# Patient Record
Sex: Female | Born: 1940 | Race: White | Hispanic: No | Smoking: Former smoker
Health system: Southern US, Community
[De-identification: ages and names within clinical notes are randomized; demographics above are authoritative.]

## PROBLEM LIST (undated history)

## (undated) DIAGNOSIS — I10 Essential (primary) hypertension: Secondary | ICD-10-CM

## (undated) DIAGNOSIS — B9681 Helicobacter pylori [H. pylori] as the cause of diseases classified elsewhere: Secondary | ICD-10-CM

## (undated) DIAGNOSIS — I5042 Chronic combined systolic (congestive) and diastolic (congestive) heart failure: Secondary | ICD-10-CM

## (undated) DIAGNOSIS — R7303 Prediabetes: Secondary | ICD-10-CM

## (undated) DIAGNOSIS — M1990 Unspecified osteoarthritis, unspecified site: Secondary | ICD-10-CM

## (undated) DIAGNOSIS — H3530 Unspecified macular degeneration: Secondary | ICD-10-CM

## (undated) DIAGNOSIS — I219 Acute myocardial infarction, unspecified: Secondary | ICD-10-CM

## (undated) DIAGNOSIS — E785 Hyperlipidemia, unspecified: Secondary | ICD-10-CM

## (undated) DIAGNOSIS — N183 Chronic kidney disease, stage 3 unspecified: Secondary | ICD-10-CM

## (undated) DIAGNOSIS — K2970 Gastritis, unspecified, without bleeding: Secondary | ICD-10-CM

## (undated) DIAGNOSIS — I214 Non-ST elevation (NSTEMI) myocardial infarction: Secondary | ICD-10-CM

## (undated) DIAGNOSIS — Z8673 Personal history of transient ischemic attack (TIA), and cerebral infarction without residual deficits: Secondary | ICD-10-CM

## (undated) DIAGNOSIS — J189 Pneumonia, unspecified organism: Secondary | ICD-10-CM

## (undated) DIAGNOSIS — I2510 Atherosclerotic heart disease of native coronary artery without angina pectoris: Secondary | ICD-10-CM

## (undated) DIAGNOSIS — J439 Emphysema, unspecified: Secondary | ICD-10-CM

## (undated) DIAGNOSIS — K219 Gastro-esophageal reflux disease without esophagitis: Secondary | ICD-10-CM

## (undated) DIAGNOSIS — I255 Ischemic cardiomyopathy: Secondary | ICD-10-CM

## (undated) DIAGNOSIS — C3490 Malignant neoplasm of unspecified part of unspecified bronchus or lung: Secondary | ICD-10-CM

## (undated) DIAGNOSIS — R55 Syncope and collapse: Secondary | ICD-10-CM

## (undated) DIAGNOSIS — M810 Age-related osteoporosis without current pathological fracture: Secondary | ICD-10-CM

## (undated) HISTORY — PX: CATARACT EXTRACTION: SUR2

## (undated) HISTORY — DX: Unspecified macular degeneration: H35.30

## (undated) HISTORY — DX: Ischemic cardiomyopathy: I25.5

## (undated) HISTORY — DX: Age-related osteoporosis without current pathological fracture: M81.0

## (undated) HISTORY — DX: Non-ST elevation (NSTEMI) myocardial infarction: I21.4

## (undated) HISTORY — DX: Acute myocardial infarction, unspecified: I21.9

## (undated) HISTORY — DX: Chronic kidney disease, stage 3 (moderate): N18.3

## (undated) HISTORY — DX: Emphysema, unspecified: J43.9

## (undated) HISTORY — DX: Essential (primary) hypertension: I10

## (undated) HISTORY — DX: Hyperlipidemia, unspecified: E78.5

## (undated) HISTORY — DX: Chronic kidney disease, stage 3 unspecified: N18.30

## (undated) HISTORY — DX: Chronic combined systolic (congestive) and diastolic (congestive) heart failure: I50.42

## (undated) HISTORY — DX: Malignant neoplasm of unspecified part of unspecified bronchus or lung: C34.90

## (undated) HISTORY — DX: Gastro-esophageal reflux disease without esophagitis: K21.9

## (undated) HISTORY — DX: Unspecified osteoarthritis, unspecified site: M19.90

## (undated) HISTORY — DX: Helicobacter pylori (H. pylori) as the cause of diseases classified elsewhere: B96.81

## (undated) HISTORY — PX: BREAST BIOPSY: SHX20

## (undated) HISTORY — DX: Gastritis, unspecified, without bleeding: K29.70

## (undated) HISTORY — DX: Atherosclerotic heart disease of native coronary artery without angina pectoris: I25.10

## (undated) HISTORY — PX: EYE SURGERY: SHX253

## (undated) HISTORY — DX: Personal history of transient ischemic attack (TIA), and cerebral infarction without residual deficits: Z86.73

## (undated) HISTORY — DX: Pneumonia, unspecified organism: J18.9

---

## 1984-07-27 HISTORY — PX: PARTIAL HYSTERECTOMY: SHX80

## 2006-03-24 ENCOUNTER — Encounter: Admission: RE | Admit: 2006-03-24 | Discharge: 2006-03-24 | Payer: Self-pay | Admitting: Internal Medicine

## 2006-04-20 ENCOUNTER — Encounter (INDEPENDENT_AMBULATORY_CARE_PROVIDER_SITE_OTHER): Payer: Self-pay | Admitting: Specialist

## 2006-04-20 ENCOUNTER — Encounter: Admission: RE | Admit: 2006-04-20 | Discharge: 2006-04-20 | Payer: Self-pay | Admitting: Internal Medicine

## 2006-06-02 ENCOUNTER — Ambulatory Visit (HOSPITAL_COMMUNITY): Admission: RE | Admit: 2006-06-02 | Discharge: 2006-06-02 | Payer: Self-pay | Admitting: General Surgery

## 2006-06-02 ENCOUNTER — Encounter (INDEPENDENT_AMBULATORY_CARE_PROVIDER_SITE_OTHER): Payer: Self-pay | Admitting: *Deleted

## 2006-06-22 ENCOUNTER — Encounter: Admission: RE | Admit: 2006-06-22 | Discharge: 2006-06-22 | Payer: Self-pay | Admitting: General Surgery

## 2006-07-27 HISTORY — PX: LUNG REMOVAL, PARTIAL: SHX233

## 2006-07-28 ENCOUNTER — Ambulatory Visit (HOSPITAL_COMMUNITY): Admission: RE | Admit: 2006-07-28 | Discharge: 2006-07-28 | Payer: Self-pay | Admitting: Thoracic Surgery

## 2006-08-06 ENCOUNTER — Ambulatory Visit (HOSPITAL_COMMUNITY): Admission: RE | Admit: 2006-08-06 | Discharge: 2006-08-06 | Payer: Self-pay | Admitting: Thoracic Surgery

## 2006-08-23 ENCOUNTER — Inpatient Hospital Stay (HOSPITAL_COMMUNITY): Admission: RE | Admit: 2006-08-23 | Discharge: 2006-08-28 | Payer: Self-pay | Admitting: Thoracic Surgery

## 2006-08-23 ENCOUNTER — Encounter (INDEPENDENT_AMBULATORY_CARE_PROVIDER_SITE_OTHER): Payer: Self-pay | Admitting: Specialist

## 2006-08-27 ENCOUNTER — Ambulatory Visit: Payer: Self-pay | Admitting: Internal Medicine

## 2006-08-31 ENCOUNTER — Encounter: Admission: RE | Admit: 2006-08-31 | Discharge: 2006-08-31 | Payer: Self-pay | Admitting: Thoracic Surgery

## 2006-08-31 ENCOUNTER — Ambulatory Visit: Payer: Self-pay | Admitting: Thoracic Surgery

## 2006-09-01 ENCOUNTER — Encounter: Admission: RE | Admit: 2006-09-01 | Discharge: 2006-09-01 | Payer: Self-pay | Admitting: Thoracic Surgery

## 2006-09-01 ENCOUNTER — Inpatient Hospital Stay (HOSPITAL_COMMUNITY): Admission: AD | Admit: 2006-09-01 | Discharge: 2006-09-08 | Payer: Self-pay | Admitting: Thoracic Surgery

## 2006-09-01 ENCOUNTER — Ambulatory Visit: Payer: Self-pay | Admitting: Thoracic Surgery

## 2006-09-11 ENCOUNTER — Ambulatory Visit: Payer: Self-pay | Admitting: Cardiothoracic Surgery

## 2006-09-11 ENCOUNTER — Emergency Department (HOSPITAL_COMMUNITY): Admission: EM | Admit: 2006-09-11 | Discharge: 2006-09-11 | Payer: Self-pay | Admitting: Emergency Medicine

## 2006-09-14 ENCOUNTER — Ambulatory Visit: Payer: Self-pay | Admitting: Thoracic Surgery

## 2006-09-14 ENCOUNTER — Encounter: Admission: RE | Admit: 2006-09-14 | Discharge: 2006-09-14 | Payer: Self-pay | Admitting: Thoracic Surgery

## 2006-09-15 ENCOUNTER — Ambulatory Visit: Payer: Self-pay | Admitting: Thoracic Surgery

## 2006-09-15 ENCOUNTER — Encounter: Admission: RE | Admit: 2006-09-15 | Discharge: 2006-09-15 | Payer: Self-pay | Admitting: Thoracic Surgery

## 2006-09-22 ENCOUNTER — Encounter: Admission: RE | Admit: 2006-09-22 | Discharge: 2006-09-22 | Payer: Self-pay | Admitting: Thoracic Surgery

## 2006-09-22 ENCOUNTER — Ambulatory Visit: Payer: Self-pay | Admitting: Thoracic Surgery

## 2006-09-29 ENCOUNTER — Ambulatory Visit: Payer: Self-pay | Admitting: Thoracic Surgery

## 2006-10-05 LAB — CBC WITH DIFFERENTIAL/PLATELET
BASO%: 0.7 % (ref 0.0–2.0)
LYMPH%: 29.7 % (ref 14.0–48.0)
MCHC: 35.1 g/dL (ref 32.0–36.0)
MONO#: 0.6 10*3/uL (ref 0.1–0.9)
RBC: 4.24 10*6/uL (ref 3.70–5.32)
WBC: 8 10*3/uL (ref 3.9–10.0)
lymph#: 2.4 10*3/uL (ref 0.9–3.3)

## 2006-10-05 LAB — COMPREHENSIVE METABOLIC PANEL
ALT: 11 U/L (ref 0–35)
Alkaline Phosphatase: 123 U/L — ABNORMAL HIGH (ref 39–117)
CO2: 26 mEq/L (ref 19–32)
Creatinine, Ser: 0.61 mg/dL (ref 0.40–1.20)
Total Bilirubin: 0.5 mg/dL (ref 0.3–1.2)

## 2006-10-27 ENCOUNTER — Encounter: Admission: RE | Admit: 2006-10-27 | Discharge: 2006-10-27 | Payer: Self-pay | Admitting: Thoracic Surgery

## 2006-10-27 ENCOUNTER — Ambulatory Visit: Payer: Self-pay | Admitting: Thoracic Surgery

## 2006-12-29 ENCOUNTER — Encounter: Admission: RE | Admit: 2006-12-29 | Discharge: 2006-12-29 | Payer: Self-pay | Admitting: Thoracic Surgery

## 2006-12-29 ENCOUNTER — Ambulatory Visit: Payer: Self-pay | Admitting: Thoracic Surgery

## 2007-03-29 ENCOUNTER — Encounter: Admission: RE | Admit: 2007-03-29 | Discharge: 2007-03-29 | Payer: Self-pay | Admitting: Internal Medicine

## 2007-04-01 ENCOUNTER — Ambulatory Visit: Payer: Self-pay | Admitting: Internal Medicine

## 2007-04-05 LAB — COMPREHENSIVE METABOLIC PANEL
CO2: 25 mEq/L (ref 19–32)
Creatinine, Ser: 0.73 mg/dL (ref 0.40–1.20)
Glucose, Bld: 112 mg/dL — ABNORMAL HIGH (ref 70–99)
Total Bilirubin: 0.4 mg/dL (ref 0.3–1.2)

## 2007-04-05 LAB — CBC WITH DIFFERENTIAL/PLATELET
BASO%: 0.4 % (ref 0.0–2.0)
Eosinophils Absolute: 0.5 10*3/uL (ref 0.0–0.5)
HCT: 38.1 % (ref 34.8–46.6)
LYMPH%: 27.5 % (ref 14.0–48.0)
MCHC: 35.2 g/dL (ref 32.0–36.0)
MCV: 91.9 fL (ref 81.0–101.0)
MONO#: 0.7 10*3/uL (ref 0.1–0.9)
NEUT%: 58.7 % (ref 39.6–76.8)
Platelets: 308 10*3/uL (ref 145–400)
WBC: 9.1 10*3/uL (ref 3.9–10.0)

## 2007-04-07 ENCOUNTER — Ambulatory Visit: Payer: Self-pay | Admitting: Thoracic Surgery

## 2007-04-07 ENCOUNTER — Encounter: Admission: RE | Admit: 2007-04-07 | Discharge: 2007-04-07 | Payer: Self-pay | Admitting: Thoracic Surgery

## 2007-07-06 ENCOUNTER — Encounter: Admission: RE | Admit: 2007-07-06 | Discharge: 2007-07-06 | Payer: Self-pay | Admitting: Internal Medicine

## 2007-08-02 ENCOUNTER — Ambulatory Visit: Payer: Self-pay | Admitting: Thoracic Surgery

## 2007-08-02 ENCOUNTER — Encounter: Admission: RE | Admit: 2007-08-02 | Discharge: 2007-08-02 | Payer: Self-pay | Admitting: Thoracic Surgery

## 2007-09-30 ENCOUNTER — Ambulatory Visit: Payer: Self-pay | Admitting: Internal Medicine

## 2007-10-04 ENCOUNTER — Ambulatory Visit (HOSPITAL_COMMUNITY): Admission: RE | Admit: 2007-10-04 | Discharge: 2007-10-04 | Payer: Self-pay | Admitting: Internal Medicine

## 2007-10-04 LAB — COMPREHENSIVE METABOLIC PANEL
ALT: 19 U/L (ref 0–35)
Albumin: 3.4 g/dL — ABNORMAL LOW (ref 3.5–5.2)
CO2: 27 mEq/L (ref 19–32)
Calcium: 8 mg/dL — ABNORMAL LOW (ref 8.4–10.5)
Chloride: 107 mEq/L (ref 96–112)
Sodium: 135 mEq/L (ref 135–145)
Total Protein: 6.3 g/dL (ref 6.0–8.3)

## 2007-10-04 LAB — CBC WITH DIFFERENTIAL/PLATELET
BASO%: 0.7 % (ref 0.0–2.0)
HCT: 38.3 % (ref 34.8–46.6)
MCHC: 34.2 g/dL (ref 32.0–36.0)
MONO#: 0.6 10*3/uL (ref 0.1–0.9)
NEUT%: 42.3 % (ref 39.6–76.8)
RBC: 4.15 10*6/uL (ref 3.70–5.32)
WBC: 7 10*3/uL (ref 3.9–10.0)
lymph#: 2.8 10*3/uL (ref 0.9–3.3)

## 2007-10-26 DIAGNOSIS — B9681 Helicobacter pylori [H. pylori] as the cause of diseases classified elsewhere: Secondary | ICD-10-CM

## 2007-10-26 HISTORY — DX: Helicobacter pylori (H. pylori) as the cause of diseases classified elsewhere: B96.81

## 2007-11-02 ENCOUNTER — Ambulatory Visit: Payer: Self-pay | Admitting: Thoracic Surgery

## 2008-04-03 ENCOUNTER — Encounter: Admission: RE | Admit: 2008-04-03 | Discharge: 2008-04-03 | Payer: Self-pay | Admitting: Internal Medicine

## 2008-04-09 ENCOUNTER — Ambulatory Visit: Payer: Self-pay | Admitting: Internal Medicine

## 2008-04-11 ENCOUNTER — Ambulatory Visit (HOSPITAL_COMMUNITY): Admission: RE | Admit: 2008-04-11 | Discharge: 2008-04-11 | Payer: Self-pay | Admitting: Internal Medicine

## 2008-04-11 LAB — COMPREHENSIVE METABOLIC PANEL
ALT: 21 U/L (ref 0–35)
CO2: 29 mEq/L (ref 19–32)
Calcium: 8.9 mg/dL (ref 8.4–10.5)
Chloride: 103 mEq/L (ref 96–112)
Creatinine, Ser: 0.83 mg/dL (ref 0.40–1.20)

## 2008-04-11 LAB — CBC WITH DIFFERENTIAL/PLATELET
BASO%: 0.7 % (ref 0.0–2.0)
Basophils Absolute: 0 10*3/uL (ref 0.0–0.1)
Eosinophils Absolute: 0.4 10*3/uL (ref 0.0–0.5)
HCT: 40.7 % (ref 34.8–46.6)
HGB: 13.9 g/dL (ref 11.6–15.9)
MCHC: 34 g/dL (ref 32.0–36.0)
MONO#: 0.4 10*3/uL (ref 0.1–0.9)
NEUT#: 3.2 10*3/uL (ref 1.5–6.5)
NEUT%: 53.1 % (ref 39.6–76.8)
WBC: 6.1 10*3/uL (ref 3.9–10.0)
lymph#: 2 10*3/uL (ref 0.9–3.3)

## 2008-10-09 ENCOUNTER — Ambulatory Visit: Payer: Self-pay | Admitting: Internal Medicine

## 2008-10-11 ENCOUNTER — Ambulatory Visit (HOSPITAL_COMMUNITY): Admission: RE | Admit: 2008-10-11 | Discharge: 2008-10-11 | Payer: Self-pay | Admitting: Internal Medicine

## 2008-10-11 LAB — CBC WITH DIFFERENTIAL/PLATELET
BASO%: 1 % (ref 0.0–2.0)
LYMPH%: 38.1 % (ref 14.0–49.7)
MCHC: 33.8 g/dL (ref 31.5–36.0)
MONO#: 0.5 10*3/uL (ref 0.1–0.9)
RBC: 4.47 10*6/uL (ref 3.70–5.45)
RDW: 13.1 % (ref 11.2–14.5)
WBC: 5.8 10*3/uL (ref 3.9–10.3)
lymph#: 2.2 10*3/uL (ref 0.9–3.3)

## 2008-10-11 LAB — COMPREHENSIVE METABOLIC PANEL
ALT: 19 U/L (ref 0–35)
CO2: 28 mEq/L (ref 19–32)
Chloride: 101 mEq/L (ref 96–112)
Potassium: 4.3 mEq/L (ref 3.5–5.3)
Sodium: 136 mEq/L (ref 135–145)
Total Bilirubin: 0.8 mg/dL (ref 0.3–1.2)
Total Protein: 6.8 g/dL (ref 6.0–8.3)

## 2009-02-14 ENCOUNTER — Encounter: Admission: RE | Admit: 2009-02-14 | Discharge: 2009-02-14 | Payer: Self-pay | Admitting: Internal Medicine

## 2009-03-27 DIAGNOSIS — I219 Acute myocardial infarction, unspecified: Secondary | ICD-10-CM

## 2009-03-27 DIAGNOSIS — I2510 Atherosclerotic heart disease of native coronary artery without angina pectoris: Secondary | ICD-10-CM

## 2009-03-27 HISTORY — DX: Atherosclerotic heart disease of native coronary artery without angina pectoris: I25.10

## 2009-03-27 HISTORY — DX: Acute myocardial infarction, unspecified: I21.9

## 2009-04-07 ENCOUNTER — Inpatient Hospital Stay (HOSPITAL_COMMUNITY): Admission: EM | Admit: 2009-04-07 | Discharge: 2009-04-12 | Payer: Self-pay | Admitting: Emergency Medicine

## 2009-04-07 ENCOUNTER — Ambulatory Visit: Payer: Self-pay | Admitting: Cardiovascular Disease

## 2009-04-08 ENCOUNTER — Encounter: Payer: Self-pay | Admitting: Cardiovascular Disease

## 2009-04-09 ENCOUNTER — Ambulatory Visit: Payer: Self-pay | Admitting: Internal Medicine

## 2009-04-15 ENCOUNTER — Telehealth: Payer: Self-pay | Admitting: Cardiovascular Disease

## 2009-04-23 DIAGNOSIS — H3530 Unspecified macular degeneration: Secondary | ICD-10-CM

## 2009-04-23 DIAGNOSIS — M1990 Unspecified osteoarthritis, unspecified site: Secondary | ICD-10-CM

## 2009-04-23 DIAGNOSIS — I10 Essential (primary) hypertension: Secondary | ICD-10-CM

## 2009-04-24 ENCOUNTER — Encounter: Payer: Self-pay | Admitting: Physician Assistant

## 2009-04-24 ENCOUNTER — Encounter (INDEPENDENT_AMBULATORY_CARE_PROVIDER_SITE_OTHER): Payer: Self-pay | Admitting: *Deleted

## 2009-04-24 ENCOUNTER — Ambulatory Visit: Payer: Self-pay | Admitting: Cardiology

## 2009-04-24 DIAGNOSIS — I2510 Atherosclerotic heart disease of native coronary artery without angina pectoris: Secondary | ICD-10-CM

## 2009-04-24 DIAGNOSIS — K219 Gastro-esophageal reflux disease without esophagitis: Secondary | ICD-10-CM | POA: Insufficient documentation

## 2009-04-24 DIAGNOSIS — E785 Hyperlipidemia, unspecified: Secondary | ICD-10-CM | POA: Insufficient documentation

## 2009-04-30 ENCOUNTER — Ambulatory Visit (HOSPITAL_COMMUNITY): Admission: RE | Admit: 2009-04-30 | Discharge: 2009-04-30 | Payer: Self-pay | Admitting: Internal Medicine

## 2009-04-30 LAB — COMPREHENSIVE METABOLIC PANEL
ALT: 17 U/L (ref 0–35)
CO2: 24 mEq/L (ref 19–32)
Calcium: 8.9 mg/dL (ref 8.4–10.5)
Chloride: 105 mEq/L (ref 96–112)
Creatinine, Ser: 1.01 mg/dL (ref 0.40–1.20)
Glucose, Bld: 110 mg/dL — ABNORMAL HIGH (ref 70–99)

## 2009-04-30 LAB — CBC WITH DIFFERENTIAL/PLATELET
BASO%: 1 % (ref 0.0–2.0)
Basophils Absolute: 0.1 10*3/uL (ref 0.0–0.1)
Eosinophils Absolute: 0.4 10*3/uL (ref 0.0–0.5)
HCT: 36.2 % (ref 34.8–46.6)
HGB: 12.3 g/dL (ref 11.6–15.9)
LYMPH%: 37.4 % (ref 14.0–49.7)
MCHC: 33.8 g/dL (ref 31.5–36.0)
MONO#: 0.5 10*3/uL (ref 0.1–0.9)
NEUT#: 2.6 10*3/uL (ref 1.5–6.5)
NEUT%: 46.1 % (ref 38.4–76.8)
Platelets: 275 10*3/uL (ref 145–400)
WBC: 5.7 10*3/uL (ref 3.9–10.3)
lymph#: 2.1 10*3/uL (ref 0.9–3.3)

## 2009-05-02 ENCOUNTER — Encounter: Payer: Self-pay | Admitting: Cardiovascular Disease

## 2009-05-05 ENCOUNTER — Ambulatory Visit: Payer: Self-pay | Admitting: Cardiovascular Disease

## 2009-05-05 ENCOUNTER — Inpatient Hospital Stay (HOSPITAL_COMMUNITY): Admission: EM | Admit: 2009-05-05 | Discharge: 2009-05-11 | Payer: Self-pay | Admitting: Emergency Medicine

## 2009-05-06 ENCOUNTER — Encounter: Payer: Self-pay | Admitting: Cardiovascular Disease

## 2009-05-09 ENCOUNTER — Encounter: Payer: Self-pay | Admitting: Cardiology

## 2009-05-13 ENCOUNTER — Telehealth: Payer: Self-pay | Admitting: Cardiovascular Disease

## 2009-05-13 ENCOUNTER — Encounter: Payer: Self-pay | Admitting: Cardiovascular Disease

## 2009-05-16 ENCOUNTER — Telehealth: Payer: Self-pay | Admitting: Cardiovascular Disease

## 2009-05-20 ENCOUNTER — Ambulatory Visit: Payer: Self-pay | Admitting: Cardiovascular Disease

## 2009-05-21 ENCOUNTER — Ambulatory Visit: Payer: Self-pay | Admitting: Internal Medicine

## 2009-05-22 LAB — CONVERTED CEMR LAB
AST: 22 units/L (ref 0–37)
Albumin: 3.7 g/dL (ref 3.5–5.2)
Alkaline Phosphatase: 98 units/L (ref 39–117)
BUN: 16 mg/dL (ref 6–23)
CO2: 31 meq/L (ref 19–32)
Calcium: 9 mg/dL (ref 8.4–10.5)
Cholesterol: 142 mg/dL (ref 0–200)
Creatinine, Ser: 1 mg/dL (ref 0.4–1.2)
Total Protein: 7 g/dL (ref 6.0–8.3)
Triglycerides: 77 mg/dL (ref 0.0–149.0)

## 2009-05-23 ENCOUNTER — Ambulatory Visit (HOSPITAL_COMMUNITY): Admission: RE | Admit: 2009-05-23 | Discharge: 2009-05-23 | Payer: Self-pay | Admitting: Internal Medicine

## 2009-05-23 LAB — COMPREHENSIVE METABOLIC PANEL
ALT: 23 U/L (ref 0–35)
Albumin: 3.8 g/dL (ref 3.5–5.2)
CO2: 30 mEq/L (ref 19–32)
Calcium: 9.2 mg/dL (ref 8.4–10.5)
Chloride: 101 mEq/L (ref 96–112)
Glucose, Bld: 103 mg/dL — ABNORMAL HIGH (ref 70–99)
Sodium: 137 mEq/L (ref 135–145)
Total Protein: 7 g/dL (ref 6.0–8.3)

## 2009-05-23 LAB — CBC WITH DIFFERENTIAL/PLATELET
BASO%: 0.9 % (ref 0.0–2.0)
Eosinophils Absolute: 0.6 10*3/uL — ABNORMAL HIGH (ref 0.0–0.5)
HCT: 39.6 % (ref 34.8–46.6)
MCHC: 34 g/dL (ref 31.5–36.0)
MONO#: 0.5 10*3/uL (ref 0.1–0.9)
NEUT#: 2.9 10*3/uL (ref 1.5–6.5)
RBC: 4.19 10*6/uL (ref 3.70–5.45)
WBC: 6 10*3/uL (ref 3.9–10.3)
lymph#: 1.9 10*3/uL (ref 0.9–3.3)

## 2009-05-28 ENCOUNTER — Encounter: Payer: Self-pay | Admitting: Cardiovascular Disease

## 2009-05-30 ENCOUNTER — Encounter: Payer: PRIVATE HEALTH INSURANCE | Admitting: Cardiovascular Disease

## 2009-05-31 ENCOUNTER — Encounter (INDEPENDENT_AMBULATORY_CARE_PROVIDER_SITE_OTHER): Payer: Self-pay | Admitting: *Deleted

## 2009-06-03 ENCOUNTER — Ambulatory Visit: Payer: Self-pay | Admitting: Cardiovascular Disease

## 2009-06-04 ENCOUNTER — Encounter: Payer: Self-pay | Admitting: Cardiovascular Disease

## 2009-06-04 ENCOUNTER — Ambulatory Visit: Payer: Self-pay | Admitting: Thoracic Surgery

## 2009-06-11 ENCOUNTER — Ambulatory Visit: Payer: Self-pay | Admitting: Thoracic Surgery

## 2009-06-11 ENCOUNTER — Encounter: Payer: Self-pay | Admitting: Thoracic Surgery

## 2009-06-11 ENCOUNTER — Ambulatory Visit (HOSPITAL_COMMUNITY): Admission: RE | Admit: 2009-06-11 | Discharge: 2009-06-11 | Payer: Self-pay | Admitting: Thoracic Surgery

## 2009-06-12 ENCOUNTER — Ambulatory Visit: Payer: Self-pay | Admitting: Thoracic Surgery

## 2009-06-17 DIAGNOSIS — I5043 Acute on chronic combined systolic (congestive) and diastolic (congestive) heart failure: Secondary | ICD-10-CM | POA: Insufficient documentation

## 2009-06-17 DIAGNOSIS — I5042 Chronic combined systolic (congestive) and diastolic (congestive) heart failure: Secondary | ICD-10-CM

## 2009-06-17 HISTORY — DX: Chronic combined systolic (congestive) and diastolic (congestive) heart failure: I50.42

## 2009-06-19 ENCOUNTER — Encounter: Payer: Self-pay | Admitting: Cardiovascular Disease

## 2009-06-26 ENCOUNTER — Encounter: Payer: PRIVATE HEALTH INSURANCE | Admitting: Cardiovascular Disease

## 2009-06-27 ENCOUNTER — Encounter: Payer: Self-pay | Admitting: Cardiovascular Disease

## 2009-07-08 ENCOUNTER — Ambulatory Visit: Payer: Self-pay | Admitting: Internal Medicine

## 2009-07-09 ENCOUNTER — Ambulatory Visit: Payer: Self-pay | Admitting: Thoracic Surgery

## 2009-07-10 ENCOUNTER — Encounter: Payer: Self-pay | Admitting: Cardiovascular Disease

## 2009-07-10 LAB — CBC WITH DIFFERENTIAL/PLATELET
Eosinophils Absolute: 0.6 10*3/uL — ABNORMAL HIGH (ref 0.0–0.5)
MONO#: 0.6 10*3/uL (ref 0.1–0.9)
NEUT#: 2.5 10*3/uL (ref 1.5–6.5)
Platelets: 227 10*3/uL (ref 145–400)
RBC: 3.95 10*6/uL (ref 3.70–5.45)
RDW: 14.2 % (ref 11.2–14.5)
WBC: 6.1 10*3/uL (ref 3.9–10.3)

## 2009-07-10 LAB — COMPREHENSIVE METABOLIC PANEL
Albumin: 4 g/dL (ref 3.5–5.2)
CO2: 27 mEq/L (ref 19–32)
Glucose, Bld: 103 mg/dL — ABNORMAL HIGH (ref 70–99)
Potassium: 4 mEq/L (ref 3.5–5.3)
Sodium: 140 mEq/L (ref 135–145)
Total Protein: 6.7 g/dL (ref 6.0–8.3)

## 2009-07-15 ENCOUNTER — Ambulatory Visit: Payer: Self-pay | Admitting: Cardiovascular Disease

## 2009-07-15 ENCOUNTER — Encounter: Payer: Self-pay | Admitting: Cardiovascular Disease

## 2009-07-27 ENCOUNTER — Encounter: Payer: PRIVATE HEALTH INSURANCE | Admitting: Cardiovascular Disease

## 2009-08-14 ENCOUNTER — Encounter: Payer: Self-pay | Admitting: Cardiovascular Disease

## 2009-09-02 ENCOUNTER — Ambulatory Visit: Payer: Self-pay | Admitting: Cardiovascular Disease

## 2009-09-26 ENCOUNTER — Encounter: Payer: Self-pay | Admitting: Cardiovascular Disease

## 2009-09-27 ENCOUNTER — Ambulatory Visit: Payer: Self-pay | Admitting: Internal Medicine

## 2009-10-01 ENCOUNTER — Ambulatory Visit (HOSPITAL_COMMUNITY): Admission: RE | Admit: 2009-10-01 | Discharge: 2009-10-01 | Payer: Self-pay | Admitting: Internal Medicine

## 2009-10-01 LAB — COMPREHENSIVE METABOLIC PANEL
Alkaline Phosphatase: 120 U/L — ABNORMAL HIGH (ref 39–117)
CO2: 30 mEq/L (ref 19–32)
Creatinine, Ser: 1.03 mg/dL (ref 0.40–1.20)
Glucose, Bld: 104 mg/dL — ABNORMAL HIGH (ref 70–99)
Sodium: 137 mEq/L (ref 135–145)
Total Bilirubin: 0.7 mg/dL (ref 0.3–1.2)
Total Protein: 7.2 g/dL (ref 6.0–8.3)

## 2009-10-01 LAB — CBC WITH DIFFERENTIAL/PLATELET
EOS%: 7.3 % — ABNORMAL HIGH (ref 0.0–7.0)
Eosinophils Absolute: 0.4 10*3/uL (ref 0.0–0.5)
LYMPH%: 42.7 % (ref 14.0–49.7)
MCH: 31.8 pg (ref 25.1–34.0)
MCV: 92.8 fL (ref 79.5–101.0)
MONO%: 7.7 % (ref 0.0–14.0)
Platelets: 236 10*3/uL (ref 145–400)
RBC: 4.18 10*6/uL (ref 3.70–5.45)
RDW: 13.3 % (ref 11.2–14.5)
nRBC: 0 % (ref 0–0)

## 2009-10-10 ENCOUNTER — Encounter: Payer: Self-pay | Admitting: Cardiovascular Disease

## 2009-10-17 ENCOUNTER — Ambulatory Visit: Payer: Self-pay | Admitting: Cardiovascular Disease

## 2009-10-17 ENCOUNTER — Encounter: Payer: Self-pay | Admitting: Cardiovascular Disease

## 2009-10-20 ENCOUNTER — Encounter (INDEPENDENT_AMBULATORY_CARE_PROVIDER_SITE_OTHER): Payer: Self-pay | Admitting: *Deleted

## 2009-11-14 ENCOUNTER — Encounter (INDEPENDENT_AMBULATORY_CARE_PROVIDER_SITE_OTHER): Payer: Self-pay | Admitting: *Deleted

## 2010-01-02 ENCOUNTER — Ambulatory Visit: Payer: Self-pay | Admitting: Cardiovascular Disease

## 2010-01-03 ENCOUNTER — Encounter: Payer: Self-pay | Admitting: Cardiovascular Disease

## 2010-01-03 LAB — CONVERTED CEMR LAB
AST: 34 units/L (ref 0–37)
BUN: 21 mg/dL (ref 6–23)
Calcium: 9.2 mg/dL (ref 8.4–10.5)
Cholesterol: 165 mg/dL (ref 0–200)
GFR calc non Af Amer: 56.46 mL/min (ref >60)
HDL: 72.9 mg/dL (ref >39.00)
LDL Cholesterol: 79 mg/dL (ref 0–99)
Potassium: 4.5 meq/L (ref 3.5–5.1)
Sodium: 138 meq/L (ref 135–145)
Total Bilirubin: 0.7 mg/dL (ref 0.3–1.2)
VLDL: 13.4 mg/dL (ref 0.0–40.0)

## 2010-04-02 ENCOUNTER — Ambulatory Visit: Payer: Self-pay | Admitting: Internal Medicine

## 2010-04-04 ENCOUNTER — Ambulatory Visit: Payer: Self-pay | Admitting: Cardiovascular Disease

## 2010-04-07 ENCOUNTER — Ambulatory Visit (HOSPITAL_COMMUNITY): Admission: RE | Admit: 2010-04-07 | Discharge: 2010-04-07 | Payer: Self-pay | Admitting: Internal Medicine

## 2010-04-07 ENCOUNTER — Encounter: Payer: Self-pay | Admitting: Cardiovascular Disease

## 2010-04-07 LAB — COMPREHENSIVE METABOLIC PANEL
ALT: 27 U/L (ref 0–35)
AST: 30 U/L (ref 0–37)
Albumin: 4 g/dL (ref 3.5–5.2)
Alkaline Phosphatase: 100 U/L (ref 39–117)
Potassium: 4.4 mEq/L (ref 3.5–5.3)
Sodium: 140 mEq/L (ref 135–145)
Total Bilirubin: 0.8 mg/dL (ref 0.3–1.2)
Total Protein: 7.1 g/dL (ref 6.0–8.3)

## 2010-04-07 LAB — CBC WITH DIFFERENTIAL/PLATELET
BASO%: 0.8 % (ref 0.0–2.0)
EOS%: 8.3 % — ABNORMAL HIGH (ref 0.0–7.0)
Eosinophils Absolute: 0.5 10*3/uL (ref 0.0–0.5)
LYMPH%: 33.5 % (ref 14.0–49.7)
MCH: 31.9 pg (ref 25.1–34.0)
MCHC: 33 g/dL (ref 31.5–36.0)
MCV: 96.5 fL (ref 79.5–101.0)
MONO%: 9 % (ref 0.0–14.0)
NEUT#: 3.2 10*3/uL (ref 1.5–6.5)
RBC: 4.15 10*6/uL (ref 3.70–5.45)
RDW: 13.5 % (ref 11.2–14.5)

## 2010-04-10 ENCOUNTER — Encounter: Payer: Self-pay | Admitting: Cardiovascular Disease

## 2010-05-12 ENCOUNTER — Ambulatory Visit: Payer: Self-pay | Admitting: Cardiovascular Disease

## 2010-07-03 ENCOUNTER — Encounter: Payer: Self-pay | Admitting: Cardiovascular Disease

## 2010-08-16 ENCOUNTER — Other Ambulatory Visit: Payer: Self-pay | Admitting: Internal Medicine

## 2010-08-16 DIAGNOSIS — C3490 Malignant neoplasm of unspecified part of unspecified bronchus or lung: Secondary | ICD-10-CM

## 2010-08-17 ENCOUNTER — Encounter: Payer: Self-pay | Admitting: Internal Medicine

## 2010-08-17 ENCOUNTER — Encounter: Payer: Self-pay | Admitting: Thoracic Surgery

## 2010-08-18 ENCOUNTER — Encounter: Payer: Self-pay | Admitting: Thoracic Surgery

## 2010-08-18 ENCOUNTER — Encounter: Payer: Self-pay | Admitting: Internal Medicine

## 2010-08-26 NOTE — Assessment & Plan Note (Signed)
Summary: f6m   Visit Type:  3 months follow up  Primary Provider:  Dr Cindee Lame  CC:  Chest soreness.  History of Present Illness: This is a 70 year old female patient, who had an ST elevation MI treated with a bare-metal stent to the circumflex April 07, 2009. She initially did well, but was hospitalized for CHF and underwent repeat right and left heart cathshowing LCx stent was widely patent and she has continued with medical management.  She has a hx of lung CA and was noted to have mediastinal adenopathy noted in November 2010. She underwent biopsy demonstrating no disease recurrence.   Overall she is doing well. She has been walking regularly for exercise, about 1.5 miles 4 days per week. She is limited by shortness of breath. She does admit to chest pain with prolonged walking but this eases up when she slows down. No change in the pattern of her angina. She has taken 2 NTG over the last several months. No edema, orthopnea, or PND.  She had an ACE-induced cough and was changed to Cozaar at the time of her last office visit 3 months ago - the cough has now resolved.      Current Medications (verified): 1)  Aspirin 325 Mg Tabs (Aspirin) .... Take 1 Tab By Mouth Every Day 2)  Plavix 75 Mg Tabs (Clopidogrel Bisulfate) .... Take One Daily 3)  Nitroglycerin 0.4 Mg/hr Pt24 (Nitroglycerin) .... Take One As Needed 4)  Simvastatin 40 Mg Tabs (Simvastatin) .... Take One Daily 5)  Lasix 40 Mg Tabs (Furosemide) .... Take 1 Tablet Am and 1/2 Tablet Pm 6)  Carvedilol 3.125 Mg Tabs (Carvedilol) .... Take One Tablet By Mouth Twice A Day 7)  Cozaar 50 Mg Tabs (Losartan Potassium) .... Take One Tablet By Mouth Daily 8)  Calcium Carbonate-Vitamin D 600-400 Mg-Unit  Tabs (Calcium Carbonate-Vitamin D) .... Take 2 Tablets Daily 9)  Placebo/darapladib 160mg  Tablet .... Take 1 Tablet By Mouth Once A Day 10)  Potassium Chloride Crys Cr 20 Meq Cr-Tabs (Potassium Chloride Crys Cr) .... Take One Tablet By  Mouth Three Times A Day  Allergies (verified): No Known Drug Allergies  Past History:  Past medical history reviewed for relevance to current acute and chronic problems.  Past Medical History: 1. Hypertension. 2. Degenerative arthritis. 3. Macular degeneration. 4. Acute myocardial infarction 2010 - treated with BMS of LCx. LVEF 50%, with subsequent CHF 5. Lung CA, s/p resection  Review of Systems       Negative except as per HPI   Vital Signs:  Patient profile:   70 year old female Height:      68 inches Weight:      145 pounds BMI:     22.13 Pulse rate:   58 / minute Pulse rhythm:   regular Resp:     18 per minute BP sitting:   122 / 70  (left arm) Cuff size:   large  Vitals Entered By: Vikki Ports (September 02, 2009 10:05 AM)  Physical Exam  General:  Pt is alert and oriented, in no acute distress. HEENT: normal Neck: normal carotid upstrokes without bruits, JVP normal Lungs: CTA CV: RRR without murmur or gallop Abd: soft, NT, positive BS, no bruit, no organomegaly Ext: no clubbing, cyanosis, or edema. peripheral pulses 2+ and equal Skin: warm and dry without rash    EKG  Procedure date:  09/02/2009  Findings:      NSR with nonspecific ST-T abnormality, unchanged from previous tracing, HR 58  bpm.  Impression & Recommendations:  Problem # 1:  CAD, NATIVE VESSEL (ICD-414.01)  Pt stable, Class II angina. She had 'relook cath' for CHF and angina, which demonstrated patent coronaries and patent stent. Continue medical therapy as below. Decrease ASA to 81 mg. Continue exercise program. Follow-up in 4 months.  Her updated medication list for this problem includes:    Aspirin 325 Mg Tabs (Aspirin) .Marland Kitchen... Take 1 tab by mouth every day    Plavix 75 Mg Tabs (Clopidogrel bisulfate) .Marland Kitchen... Take one daily    Nitroglycerin 0.4 Mg/hr Pt24 (Nitroglycerin) .Marland Kitchen... Take one as needed    Carvedilol 3.125 Mg Tabs (Carvedilol) .Marland Kitchen... Take one tablet by mouth twice a  day  Orders: EKG w/ Interpretation (93000)  Problem # 2:  HYPERLIPIDEMIA-MIXED (ICD-272.4) Lipids have been at goal (see below) - due for f/u lipids and lft's at next office visit in 4 months. Her updated medication list for this problem includes:    Simvastatin 40 Mg Tabs (Simvastatin) .Marland Kitchen... Take one daily  CHOL: 142 (05/20/2009)   LDL: 72 (05/20/2009)   HDL: 54.90 (05/20/2009)   TG: 77.0 (05/20/2009)  Problem # 3:  HYPERTENSION (ICD-401.9) BP well-controlled on current Rx.  Continue without changes. F/u BMET at next blood draw.  Her updated medication list for this problem includes:    Aspirin 81 Mg Tbec (Aspirin) .Marland Kitchen... Take one tablet by mouth daily    Lasix 40 Mg Tabs (Furosemide) .Marland Kitchen... Take 1 tablet am and 1/2 tablet pm    Carvedilol 3.125 Mg Tabs (Carvedilol) .Marland Kitchen... Take one tablet by mouth twice a day    Cozaar 50 Mg Tabs (Losartan potassium) .Marland Kitchen... Take one tablet by mouth daily  BP today: 122/70 Prior BP: 110/80 (06/03/2009)  Labs Reviewed: K+: 4.5 (05/20/2009) Creat: : 1.0 (05/20/2009)   Chol: 142 (05/20/2009)   HDL: 54.90 (05/20/2009)   LDL: 72 (05/20/2009)   TG: 77.0 (05/20/2009)  Patient Instructions: 1)  Your physician recommends that you return for a FASTING LIPID, LIVER and BMP in 4 MONTHS (414.01, 272.0, v58.69)  2)  Your physician has recommended you make the following change in your medication: DECREASE Aspirin to 81mg  once a day 3)  Your physician recommends that you schedule a follow-up appointment in: 4 MONTHS

## 2010-08-26 NOTE — Letter (Signed)
Summary: [facility]   [facility]   Imported By: Roderic Ovens 07/30/2009 13:27:44  _____________________________________________________________________  External Attachment:    Type:   Image     Comment:   External Document

## 2010-08-26 NOTE — Assessment & Plan Note (Signed)
Summary: yearly/sl   Visit Type:  1 year follow up Primary Provider:  Dr Ludwig Clarks  CC:  Sob sometimes.  History of Present Illness: This is a 70 year old woman who had an ST elevation MI treated with a bare-metal stent to the circumflex April 07, 2009. She initially did well, but was hospitalized for CHF and underwent repeat right and left heart cath showing LCx stent was widely patent and she has continued with medical management.  She presents today for follow-up evaluation.  She walks for 3 miles every other day, sometimes without stopping and other times she has to stop and rest. Denies chest pain or tightness with exertion, but has had a few brief episodes of resting chest pain. These resolved spontaneously without NTG. No edema, orthopnea, or PND.             Current Medications (verified): 1)  Aspirin 81 Mg Tbec (Aspirin) .... Take One Tablet By Mouth Daily 2)  Plavix 75 Mg Tabs (Clopidogrel Bisulfate) .... Take One Daily 3)  Nitroglycerin 0.4 Mg/hr Pt24 (Nitroglycerin) .... Take One As Needed 4)  Simvastatin 40 Mg Tabs (Simvastatin) .... Take One Daily 5)  Lasix 40 Mg Tabs (Furosemide) .... Take 1 Tablet Am and 1/2 Tablet Pm 6)  Carvedilol 3.125 Mg Tabs (Carvedilol) .... Take One Tablet By Mouth Twice A Day 7)  Cozaar 50 Mg Tabs (Losartan Potassium) .... Take One Tablet By Mouth Daily 8)  Calcium Carbonate-Vitamin D 600-400 Mg-Unit  Tabs (Calcium Carbonate-Vitamin D) .... Take 2 Tablets Daily 9)  Placebo/darapladib 160mg  Tablet .... Take 1 Tablet By Mouth Once A Day 10)  Potassium Chloride Crys Cr 20 Meq Cr-Tabs (Potassium Chloride Crys Cr) .... Take 1 Tablet By Mouth Once A Day 11)  Famotidine 40 Mg Tabs (Famotidine) .Marland Kitchen.. 1 Tab By Mouth Daily  Allergies (verified): No Known Drug Allergies  Past History:  Past medical history reviewed for relevance to current acute and chronic problems.  Past Medical History: Reviewed history from 01/02/2010 and no changes  required. 1. Hypertension. 2. Degenerative arthritis. 3. Macular degeneration. 4. Acute myocardial infarction 2010 - treated with BMS of LCx. LVEF 50%, with subsequent CHF 5. Lung CA, s/p resection, followed by Dr Shirline Frees  Review of Systems       Negative except as per HPI   Vital Signs:  Patient profile:   70 year old female Height:      68 inches Weight:      139.75 pounds BMI:     21.33 Pulse rate:   62 / minute Pulse rhythm:   regular Resp:     18 per minute BP sitting:   124 / 74  (left arm) Cuff size:   large  Vitals Entered By: Vikki Ports (May 12, 2010 3:17 PM)  Physical Exam  General:  Pt is alert and oriented, age-appropriate woman, in no acute distress. HEENT: normal Neck: normal carotid upstrokes without bruits, JVP normal Lungs: Decreased breath sounds throughout but no rales CV: RRR without murmur or gallop Abd: soft, NT, positive BS, no bruit, no organomegaly Ext: no clubbing, cyanosis, or edema. peripheral pulses 2+ and equal Skin: warm and dry without rash    EKG  Procedure date:  05/12/2010  Findings:      NSR, nonspecific ST-T wave abnormality, HR 62 bpm  Impression & Recommendations:  Problem # 1:  CAD, NATIVE VESSEL (ICD-414.01) Stable without angina. She is out now 12 months from her infarct and was treated with a bare metal stent.  I advised she can stop plavix after her current bottle runs out. She will otherwise continue wiht her current medical program.  Her updated medication list for this problem includes:    Aspirin 81 Mg Tbec (Aspirin) .Marland Kitchen... Take one tablet by mouth daily    Plavix 75 Mg Tabs (Clopidogrel bisulfate) .Marland Kitchen... Take one daily    Nitroglycerin 0.4 Mg/hr Pt24 (Nitroglycerin) .Marland Kitchen... Take one as needed    Carvedilol 3.125 Mg Tabs (Carvedilol) .Marland Kitchen... Take one tablet by mouth twice a day  Orders: EKG w/ Interpretation (93000)  Problem # 2:  CONGESTIVE HEART FAILURE, LEFT (ICD-428.1) Stable without evidence of volume  overload. LVEF preserved post-MI.  Problem # 3:  HYPERLIPIDEMIA-MIXED (ICD-272.4) Lipids at goal. Continue current medical program.  Her updated medication list for this problem includes:    Simvastatin 40 Mg Tabs (Simvastatin) .Marland Kitchen... Take one daily  CHOL: 165 (01/02/2010)   LDL: 79 (01/02/2010)   HDL: 72.90 (01/02/2010)   TG: 67.0 (01/02/2010)  Problem # 4:  HYPERTENSION (ICD-401.9) Controlled.  Her updated medication list for this problem includes:    Aspirin 81 Mg Tbec (Aspirin) .Marland Kitchen... Take one tablet by mouth daily    Lasix 40 Mg Tabs (Furosemide) .Marland Kitchen... Take 1 tablet am and 1/2 tablet pm    Carvedilol 3.125 Mg Tabs (Carvedilol) .Marland Kitchen... Take one tablet by mouth twice a day    Cozaar 50 Mg Tabs (Losartan potassium) .Marland Kitchen... Take one tablet by mouth daily  BP today: 124/74 Prior BP: 110/73 (01/02/2010)  Labs Reviewed: K+: 4.5 (01/02/2010) Creat: : 1.0 (01/02/2010)   Chol: 165 (01/02/2010)   HDL: 72.90 (01/02/2010)   LDL: 79 (01/02/2010)   TG: 67.0 (01/02/2010)  Patient Instructions: 1)  Your physician recommends that you schedule a follow-up appointment in: 6 months 2)  Your physician has recommended you make the following change in your medication: STOP plavix after you finish the bottle.  Prescriptions: FAMOTIDINE 40 MG TABS (FAMOTIDINE) 1 tab by mouth daily  #30 x 6   Entered by:   Whitney Maeola Sarah RN   Authorized by:   Norva Karvonen, MD   Signed by:   Ellender Hose RN on 05/12/2010   Method used:   Electronically to        [facility]* (retail)       [address]       [place], [place]  [address]       Ph: [phone]       Fax: [phone]   RxID:   [id number]

## 2010-08-26 NOTE — Letter (Signed)
[facility]   Imported By: Roderic Ovens 11/18/2009 16:28:49  _____________________________________________________________________  External Attachment:    Type:   Image     Comment:   External Document

## 2010-08-26 NOTE — Letter (Signed)
Summary: [facility]  [facility]   Imported By: Marylou Mccoy 05/09/2010 14:09:18  _____________________________________________________________________  External Attachment:    Type:   Image     Comment:   External Document

## 2010-08-26 NOTE — Letter (Signed)
Summary: Dr Ludwig Clarks note  Dr Ludwig Clarks note   Imported By: Kassie Mends 10/25/2009 09:41:17  _____________________________________________________________________  External Attachment:    Type:   Image     Comment:   External Document

## 2010-08-26 NOTE — Letter (Signed)
Summary: [facility]  [facility]   Imported By: Marylou Mccoy 04/07/2010 10:53:38  _____________________________________________________________________  External Attachment:    Type:   Image     Comment:   External Document  Appended Document: [facility] Pt had lab rechecked and potassium was 4.4.

## 2010-08-26 NOTE — Letter (Signed)
Summary: [facility]  [facility], Main Office  [address]   [place], [place] [address]   Phone: [phone]  Fax: [phone]     January 03, 2010 MRN: [id number]   [facility] [address] [place], [place]  [address]   Dear Ms. Putman,  We have reviewed your cholesterol results.  They are as follows:     Total Cholesterol:    165 (Desirable: less than 200)       HDL  Cholesterol:     72.90  (Desirable: greater than 40 for men and 50 for women)       LDL Cholesterol:       79  (Desirable: less than 100 for low risk and less than 70 for moderate to high risk)       Triglycerides:       67.0  (Desirable: less than 150)  Our recommendations include: Your lipids are at goal.  Liver function, potassium and kidney function are okay.   Call our office at the number listed above if you have any questions.  Lowering your LDL cholesterol is important, but it is only one of a large number of "risk factors" that may indicate that you are at risk for heart disease, stroke or other complications of hardening of the arteries.  Other risk factors include:   A.  Cigarette Smoking* B.  High Blood Pressure* C.  Obesity* D.   Low HDL Cholesterol (see yours above)* E.   Diabetes Mellitus (higher risk if your is uncontrolled) F.  Family history of premature heart disease G.  Previous history of stroke or cardiovascular disease    *These are risk factors YOU HAVE CONTROL OVER.  For more information, visit .  There is now evidence that lowering the TOTAL CHOLESTEROL AND LDL CHOLESTEROL can reduce the risk of heart disease.  The American Heart Association recommends the following guidelines for the treatment of elevated cholesterol:  1.  If there is now current heart disease and less than two risk factors, TOTAL CHOLESTEROL should be less than 200 and LDL CHOLESTEROL should be less than 100. 2.  If there is current heart disease or two or more risk factors, TOTAL  CHOLESTEROL should be less than 200 and LDL CHOLESTEROL should be less than 70.  A diet low in cholesterol, saturated fat, and calories is the cornerstone of treatment for elevated cholesterol.  Cessation of smoking and exercise are also important in the management of elevated cholesterol and preventing vascular disease.  Studies have shown that 30 to 60 minutes of physical activity most days can help lower blood pressure, lower cholesterol, and keep your weight at a healthy level.  Drug therapy is used when cholesterol levels do not respond to therapeutic lifestyle changes (smoking cessation, diet, and exercise) and remains unacceptably high.  If medication is started, it is important to have you levels checked periodically to evaluate the need for further treatment options.  Thank you,    [facility] [employer] Team

## 2010-08-26 NOTE — Assessment & Plan Note (Signed)
Summary: 4 mo f/u   Visit Type:  Follow-up Primary Provider:  Dr Ludwig Clarks  CC:  shortness of breath.  History of Present Illness: This is a 70 year old woman who had an ST elevation MI treated with a bare-metal stent to the circumflex April 07, 2009. She initially did well, but was hospitalized for CHF and underwent repeat right and left heart cath showing LCx stent was widely patent and she has continued with medical management.  She presents today for follow-up evaluation.  Pt walks three times per week. She walks 3 miles, taking a rest break after each mile. Also has some exertional chest discomfort, but this is stable and it resolves with slowing down. No resting chest pain or other complaints. No palpitations, lightheadedness, or syncope. She reports occasional right leg swelling. No NTG taken since last visit 4 months ago.           Current Medications (verified): 1)  Aspirin 81 Mg Tbec (Aspirin) .... Take One Tablet By Mouth Daily 2)  Plavix 75 Mg Tabs (Clopidogrel Bisulfate) .... Take One Daily 3)  Nitroglycerin 0.4 Mg/hr Pt24 (Nitroglycerin) .... Take One As Needed 4)  Simvastatin 40 Mg Tabs (Simvastatin) .... Take One Daily 5)  Lasix 40 Mg Tabs (Furosemide) .... Take 1 Tablet Am and 1/2 Tablet Pm 6)  Carvedilol 3.125 Mg Tabs (Carvedilol) .... Take One Tablet By Mouth Twice A Day 7)  Cozaar 50 Mg Tabs (Losartan Potassium) .... Take One Tablet By Mouth Daily 8)  Calcium Carbonate-Vitamin D 600-400 Mg-Unit  Tabs (Calcium Carbonate-Vitamin D) .... Take 2 Tablets Daily 9)  Placebo/darapladib 160mg  Tablet .... Take 1 Tablet By Mouth Once A Day 10)  Potassium Chloride Crys Cr 20 Meq Cr-Tabs (Potassium Chloride Crys Cr) .... Take 1 Tablet By Mouth Once A Day 11)  Famotidine 40 Mg Tabs (Famotidine) .Marland Kitchen.. 1 Tab By Mouth Daily  Allergies: No Known Drug Allergies  Past History:  Past Surgical History: Last updated: 04/23/2009  hysterectomy NOTE - CT to right lung   Family  History: Last updated: 04/23/2009  Positive for coronary disease, hypertension, and breast  cancer.  Past medical history reviewed for relevance to current acute and chronic problems.  Past Medical History: 1. Hypertension. 2. Degenerative arthritis. 3. Macular degeneration. 4. Acute myocardial infarction 2010 - treated with BMS of LCx. LVEF 50%, with subsequent CHF 5. Lung CA, s/p resection, followed by Dr Shirline Frees  Review of Systems       Positive for chronic cough, otherwise negative except as per HPI   Vital Signs:  Patient profile:   70 year old female Height:      68 inches Weight:      139 pounds BMI:     21.21 Pulse rate:   54 / minute Resp:     14 per minute BP sitting:   110 / 73  (left arm)  Vitals Entered By: Kem Parkinson (January 02, 2010 8:41 AM)  Physical Exam  General:  Pt is alert and oriented, age-appropriate woman, in no acute distress. HEENT: normal Neck: normal carotid upstrokes without bruits, JVP normal Lungs: Decreased breath sounds throughout but no rales CV: RRR without murmur or gallop Abd: soft, NT, positive BS, no bruit, no organomegaly Ext: no clubbing, cyanosis, or edema. peripheral pulses 2+ and equal Skin: warm and dry without rash    Impression & Recommendations:  Problem # 1:  CAD, NATIVE VESSEL (ICD-414.01) Stable, Class 2 angina. Continue current therapy with ASA, plavix, coreg, ARB. No  med changes.  Her updated medication list for this problem includes:    Aspirin 81 Mg Tbec (Aspirin) .Marland Kitchen... Take one tablet by mouth daily    Plavix 75 Mg Tabs (Clopidogrel bisulfate) .Marland Kitchen... Take one daily    Nitroglycerin 0.4 Mg/hr Pt24 (Nitroglycerin) .Marland Kitchen... Take one as needed    Carvedilol 3.125 Mg Tabs (Carvedilol) .Marland Kitchen... Take one tablet by mouth twice a day  Problem # 2:  CONGESTIVE HEART FAILURE, LEFT (ICD-428.1) Chronic diastolic heart failure. She continues to require oral furosemide. No congestion or edema on exam. Tolerating ARB and  beta-blocker. Continue current Rx. Check BMET today.  Problem # 3:  HYPERLIPIDEMIA-MIXED (ICD-272.4) Lipids have been at goal. Check lipid panel and lft's today...will forward results to Dr Ludwig Clarks.  Her updated medication list for this problem includes:    Simvastatin 40 Mg Tabs (Simvastatin) .Marland Kitchen... Take one daily  CHOL: 142 (05/20/2009)   LDL: 72 (05/20/2009)   HDL: 54.90 (05/20/2009)   TG: 77.0 (05/20/2009)

## 2010-08-26 NOTE — Miscellaneous (Signed)
Summary: research update  Clinical Lists Changes  Observations: Added new observation of RS STUDY: SOLID TIMI 52 (10/20/2009 14:17) Added new observation of [facility]ARCH[facility]: Cardiology (10/20/2009 14:17)      Research Study Name: SOLID TIMI 52

## 2010-08-26 NOTE — Miscellaneous (Signed)
Summary: [facility]  [facility]   Imported By: Roderic Ovens 08/23/2009 10:58:17  _____________________________________________________________________  External Attachment:    Type:   Image     Comment:   External Document

## 2010-08-26 NOTE — Miscellaneous (Signed)
Summary: update med  Clinical Lists Changes  Medications: Changed medication from POTASSIUM CHLORIDE CRYS CR 20 MEQ CR-TABS (POTASSIUM CHLORIDE CRYS CR) Take one tablet by mouth three times a day to POTASSIUM CHLORIDE CRYS CR 20 MEQ CR-TABS (POTASSIUM CHLORIDE CRYS CR) Take 1 tablet by mouth once a day

## 2010-08-28 NOTE — Letter (Signed)
Summary: [facility]  [facility]   Imported By: Marylou Mccoy 08/01/2010 12:07:54  _____________________________________________________________________  External Attachment:    Type:   Image     Comment:   External Document

## 2010-09-23 ENCOUNTER — Encounter: Payer: Self-pay | Admitting: Cardiovascular Disease

## 2010-09-23 ENCOUNTER — Encounter (INDEPENDENT_AMBULATORY_CARE_PROVIDER_SITE_OTHER): Payer: PRIVATE HEALTH INSURANCE

## 2010-09-23 DIAGNOSIS — R0989 Other specified symptoms and signs involving the circulatory and respiratory systems: Secondary | ICD-10-CM

## 2010-10-29 LAB — TYPE AND SCREEN

## 2010-10-29 LAB — CULTURE, RESPIRATORY W GRAM STAIN: Culture: NO GROWTH

## 2010-10-29 LAB — CBC
Platelets: 271 10*3/uL (ref 150–400)
RBC: 4.33 MIL/uL (ref 3.87–5.11)
WBC: 10 10*3/uL (ref 4.0–10.5)

## 2010-10-29 LAB — COMPREHENSIVE METABOLIC PANEL
ALT: 27 U/L (ref 0–35)
AST: 21 U/L (ref 0–37)
Albumin: 3.9 g/dL (ref 3.5–5.2)
Alkaline Phosphatase: 112 U/L (ref 39–117)
CO2: 29 mEq/L (ref 19–32)
Chloride: 99 mEq/L (ref 96–112)
Creatinine, Ser: 1.11 mg/dL (ref 0.4–1.2)
GFR calc Af Amer: 59 mL/min — ABNORMAL LOW (ref >60)
Potassium: 4.4 mEq/L (ref 3.5–5.1)
Sodium: 137 mEq/L (ref 135–145)
Total Bilirubin: 0.5 mg/dL (ref 0.3–1.2)

## 2010-10-29 LAB — AFB CULTURE WITH SMEAR (NOT AT ARMC)

## 2010-10-29 LAB — APTT: aPTT: 28 seconds (ref 24–37)

## 2010-10-29 LAB — FUNGUS CULTURE W SMEAR

## 2010-10-30 LAB — POCT I-STAT 3, VENOUS BLOOD GAS (G3P V)
Acid-Base Excess: 2 mmol/L (ref 0.0–2.0)
Acid-Base Excess: 2 mmol/L (ref 0.0–2.0)
Bicarbonate: 26.9 meq/L — ABNORMAL HIGH (ref 20.0–24.0)
Bicarbonate: 27.1 meq/L — ABNORMAL HIGH (ref 20.0–24.0)
O2 Saturation: 58 %
O2 Saturation: 61 %
TCO2: 28 mmol/L (ref 0–100)
TCO2: 28 mmol/L (ref 0–100)
pCO2, Ven: 41.4 mmHg — ABNORMAL LOW (ref 45.0–50.0)
pCO2, Ven: 42.8 mmHg — ABNORMAL LOW (ref 45.0–50.0)
pH, Ven: 7.406 — ABNORMAL HIGH (ref 7.250–7.300)
pH, Ven: 7.424 — ABNORMAL HIGH (ref 7.250–7.300)
pO2, Ven: 30 mmHg (ref 30.0–45.0)
pO2, Ven: 32 mmHg (ref 30.0–45.0)

## 2010-10-30 LAB — BASIC METABOLIC PANEL
BUN: 16 mg/dL (ref 6–23)
BUN: 17 mg/dL (ref 6–23)
Calcium: 8.6 mg/dL (ref 8.4–10.5)
Calcium: 8.8 mg/dL (ref 8.4–10.5)
Calcium: 8.8 mg/dL (ref 8.4–10.5)
Creatinine, Ser: 0.86 mg/dL (ref 0.4–1.2)
Creatinine, Ser: 0.96 mg/dL (ref 0.4–1.2)
GFR calc Af Amer: >60 mL/min (ref >60)
GFR calc Af Amer: 58 mL/min — ABNORMAL LOW (ref >60)
GFR calc Af Amer: 59 mL/min — ABNORMAL LOW (ref >60)
GFR calc Af Amer: >60 mL/min (ref >60)
GFR calc Af Amer: >60 mL/min (ref >60)
GFR calc non Af Amer: 56 mL/min — ABNORMAL LOW (ref >60)
GFR calc non Af Amer: 48 mL/min — ABNORMAL LOW (ref >60)
GFR calc non Af Amer: 49 mL/min — ABNORMAL LOW (ref >60)
GFR calc non Af Amer: 58 mL/min — ABNORMAL LOW (ref >60)
GFR calc non Af Amer: >60 mL/min (ref >60)
Potassium: 4.2 mEq/L (ref 3.5–5.1)
Potassium: 3.8 mEq/L (ref 3.5–5.1)
Potassium: 3.9 mEq/L (ref 3.5–5.1)
Sodium: 136 mEq/L (ref 135–145)
Sodium: 137 mEq/L (ref 135–145)
Sodium: 138 mEq/L (ref 135–145)

## 2010-10-30 LAB — URINALYSIS, ROUTINE W REFLEX MICROSCOPIC
Glucose, UA: NEGATIVE mg/dL
Nitrite: NEGATIVE
Protein, ur: NEGATIVE mg/dL

## 2010-10-30 LAB — COMPREHENSIVE METABOLIC PANEL WITH GFR
ALT: 48 U/L — ABNORMAL HIGH (ref 0–35)
AST: 61 U/L — ABNORMAL HIGH (ref 0–37)
Albumin: 3.3 g/dL — ABNORMAL LOW (ref 3.5–5.2)
Alkaline Phosphatase: 100 U/L (ref 39–117)
BUN: 17 mg/dL (ref 6–23)
CO2: 27 meq/L (ref 19–32)
Calcium: 9.2 mg/dL (ref 8.4–10.5)
Chloride: 101 meq/L (ref 96–112)
Creatinine, Ser: 1 mg/dL (ref 0.4–1.2)
GFR calc non Af Amer: 55 mL/min — ABNORMAL LOW
Glucose, Bld: 95 mg/dL (ref 70–99)
Potassium: 4.3 meq/L (ref 3.5–5.1)
Sodium: 136 meq/L (ref 135–145)
Total Bilirubin: 0.7 mg/dL (ref 0.3–1.2)
Total Protein: 6.3 g/dL (ref 6.0–8.3)

## 2010-10-30 LAB — CBC
HCT: 38.9 % (ref 36.0–46.0)
HCT: 33.3 % — ABNORMAL LOW (ref 36.0–46.0)
HCT: 35.3 % — ABNORMAL LOW (ref 36.0–46.0)
HCT: 37 % (ref 36.0–46.0)
HCT: 39 % (ref 36.0–46.0)
HCT: 39.7 % (ref 36.0–46.0)
Hemoglobin: 11.3 g/dL — ABNORMAL LOW (ref 12.0–15.0)
Hemoglobin: 11.4 g/dL — ABNORMAL LOW (ref 12.0–15.0)
Hemoglobin: 12 g/dL (ref 12.0–15.0)
Hemoglobin: 12.7 g/dL (ref 12.0–15.0)
MCHC: 33.9 g/dL (ref 30.0–36.0)
MCHC: 34.1 g/dL (ref 30.0–36.0)
MCHC: 34.2 g/dL (ref 30.0–36.0)
MCHC: 34.4 g/dL (ref 30.0–36.0)
MCV: 95.5 fL (ref 78.0–100.0)
MCV: 95.7 fL (ref 78.0–100.0)
MCV: 96.2 fL (ref 78.0–100.0)
MCV: 96.6 fL (ref 78.0–100.0)
Platelets: 221 10*3/uL (ref 150–400)
Platelets: 203 10*3/uL (ref 150–400)
Platelets: 206 10*3/uL (ref 150–400)
Platelets: 239 10*3/uL (ref 150–400)
Platelets: 247 10*3/uL (ref 150–400)
RBC: 4.07 MIL/uL (ref 3.87–5.11)
RBC: 3.47 MIL/uL — ABNORMAL LOW (ref 3.87–5.11)
RBC: 3.49 MIL/uL — ABNORMAL LOW (ref 3.87–5.11)
RBC: 3.67 MIL/uL — ABNORMAL LOW (ref 3.87–5.11)
RBC: 3.87 MIL/uL (ref 3.87–5.11)
RBC: 4.03 MIL/uL (ref 3.87–5.11)
RBC: 4.12 MIL/uL (ref 3.87–5.11)
RDW: 14.4 % (ref 11.5–15.5)
RDW: 14.8 % (ref 11.5–15.5)
RDW: 14.9 % (ref 11.5–15.5)
RDW: 14.9 % (ref 11.5–15.5)
WBC: 7.6 10*3/uL (ref 4.0–10.5)
WBC: 10 10*3/uL (ref 4.0–10.5)
WBC: 10.1 10*3/uL (ref 4.0–10.5)
WBC: 7.8 10*3/uL (ref 4.0–10.5)
WBC: 8 10*3/uL (ref 4.0–10.5)
WBC: 8.3 10*3/uL (ref 4.0–10.5)

## 2010-10-30 LAB — CARDIAC PANEL(CRET KIN+CKTOT+MB+TROPI)
CK, MB: 3.5 ng/mL (ref 0.3–4.0)
CK, MB: 4.7 ng/mL — ABNORMAL HIGH (ref 0.3–4.0)
Relative Index: INVALID (ref 0.0–2.5)
Relative Index: INVALID (ref 0.0–2.5)
Total CK: 58 U/L (ref 7–177)
Total CK: 62 U/L (ref 7–177)
Troponin I: 0.07 ng/mL — ABNORMAL HIGH (ref 0.00–0.06)

## 2010-10-30 LAB — POCT I-STAT 3, ART BLOOD GAS (G3+)
Acid-Base Excess: 1 mmol/L (ref 0.0–2.0)
Bicarbonate: 24.6 meq/L — ABNORMAL HIGH (ref 20.0–24.0)
O2 Saturation: 94 %
TCO2: 26 mmol/L (ref 0–100)
pCO2 arterial: 34.9 mmHg — ABNORMAL LOW (ref 35.0–45.0)
pH, Arterial: 7.457 — ABNORMAL HIGH (ref 7.350–7.400)
pO2, Arterial: 65 mmHg — ABNORMAL LOW (ref 80.0–100.0)

## 2010-10-30 LAB — URINE CULTURE
Colony Count: NO GROWTH
Culture: NO GROWTH

## 2010-10-30 LAB — DIFFERENTIAL
Basophils Absolute: 0.1 10*3/uL (ref 0.0–0.1)
Basophils Relative: 1 % (ref 0–1)
Eosinophils Absolute: 0.8 10*3/uL — ABNORMAL HIGH (ref 0.0–0.7)
Eosinophils Relative: 8 % — ABNORMAL HIGH (ref 0–5)
Lymphocytes Relative: 39 % (ref 12–46)
Lymphs Abs: 3.9 10*3/uL (ref 0.7–4.0)
Monocytes Absolute: 0.7 10*3/uL (ref 0.1–1.0)
Monocytes Relative: 7 % (ref 3–12)
Neutro Abs: 4.7 10*3/uL (ref 1.7–7.7)
Neutrophils Relative %: 47 % (ref 43–77)

## 2010-10-30 LAB — LIPID PANEL
HDL: 64 mg/dL
Total CHOL/HDL Ratio: 2 ratio
Triglycerides: 47 mg/dL
VLDL: 9 mg/dL (ref 0–40)

## 2010-10-30 LAB — HEPARIN LEVEL (UNFRACTIONATED)
Heparin Unfractionated: 0.49 IU/mL (ref 0.30–0.70)
Heparin Unfractionated: 0.64 IU/mL (ref 0.30–0.70)

## 2010-10-30 LAB — BASIC METABOLIC PANEL WITH GFR
BUN: 15 mg/dL (ref 6–23)
CO2: 28 meq/L (ref 19–32)
Calcium: 8.7 mg/dL (ref 8.4–10.5)
Chloride: 104 meq/L (ref 96–112)
Creatinine, Ser: 0.86 mg/dL (ref 0.4–1.2)
GFR calc non Af Amer: >60 mL/min
Glucose, Bld: 98 mg/dL (ref 70–99)
Potassium: 3.8 meq/L (ref 3.5–5.1)
Sodium: 139 meq/L (ref 135–145)

## 2010-10-30 LAB — PROTIME-INR: Prothrombin Time: 13.9 seconds (ref 11.6–15.2)

## 2010-10-30 LAB — CK TOTAL AND CKMB (NOT AT ARMC): Relative Index: INVALID (ref 0.0–2.5)

## 2010-10-30 LAB — GLUCOSE, CAPILLARY
Glucose-Capillary: 99 mg/dL (ref 70–99)
Glucose-Capillary: 105 mg/dL — ABNORMAL HIGH (ref 70–99)

## 2010-10-30 LAB — TSH: TSH: 1.322 u[IU]/mL (ref 0.350–4.500)

## 2010-10-30 LAB — BRAIN NATRIURETIC PEPTIDE
Pro B Natriuretic peptide (BNP): 220 pg/mL — ABNORMAL HIGH (ref 0.0–100.0)
Pro B Natriuretic peptide (BNP): 235 pg/mL — ABNORMAL HIGH (ref 0.0–100.0)

## 2010-10-30 LAB — TROPONIN I: Troponin I: 0.06 ng/mL (ref 0.00–0.06)

## 2010-10-30 LAB — MAGNESIUM: Magnesium: 1.9 mg/dL (ref 1.5–2.5)

## 2010-10-31 LAB — BASIC METABOLIC PANEL
BUN: 11 mg/dL (ref 6–23)
BUN: 11 mg/dL (ref 6–23)
BUN: 21 mg/dL (ref 6–23)
Calcium: 8.2 mg/dL — ABNORMAL LOW (ref 8.4–10.5)
Calcium: 8.1 mg/dL — ABNORMAL LOW (ref 8.4–10.5)
Chloride: 106 mEq/L (ref 96–112)
Creatinine, Ser: 0.87 mg/dL (ref 0.4–1.2)
Creatinine, Ser: 0.87 mg/dL (ref 0.4–1.2)
GFR calc Af Amer: >60 mL/min (ref >60)
GFR calc non Af Amer: 58 mL/min — ABNORMAL LOW (ref >60)
GFR calc non Af Amer: >60 mL/min (ref >60)
GFR calc non Af Amer: >60 mL/min (ref >60)
GFR calc non Af Amer: >60 mL/min (ref >60)
Glucose, Bld: 110 mg/dL — ABNORMAL HIGH (ref 70–99)
Glucose, Bld: 124 mg/dL — ABNORMAL HIGH (ref 70–99)
Potassium: 4.1 mEq/L (ref 3.5–5.1)
Sodium: 137 mEq/L (ref 135–145)

## 2010-10-31 LAB — CBC
HCT: 30.1 % — ABNORMAL LOW (ref 36.0–46.0)
HCT: 34.8 % — ABNORMAL LOW (ref 36.0–46.0)
HCT: 40.3 % (ref 36.0–46.0)
Hemoglobin: 10.8 g/dL — ABNORMAL LOW (ref 12.0–15.0)
Hemoglobin: 13.5 g/dL (ref 12.0–15.0)
MCHC: 33.6 g/dL (ref 30.0–36.0)
MCHC: 34.2 g/dL (ref 30.0–36.0)
MCHC: 34.7 g/dL (ref 30.0–36.0)
MCHC: 34.7 g/dL (ref 30.0–36.0)
MCV: 94.1 fL (ref 78.0–100.0)
MCV: 94.1 fL (ref 78.0–100.0)
MCV: 94.2 fL (ref 78.0–100.0)
Platelets: 201 10*3/uL (ref 150–400)
Platelets: 221 10*3/uL (ref 150–400)
Platelets: 204 10*3/uL (ref 150–400)
Platelets: 244 K/uL (ref 150–400)
RBC: 3.05 MIL/uL — ABNORMAL LOW (ref 3.87–5.11)
RBC: 3.69 MIL/uL — ABNORMAL LOW (ref 3.87–5.11)
RBC: 4.28 MIL/uL (ref 3.87–5.11)
RDW: 13.5 % (ref 11.5–15.5)
RDW: 13.6 % (ref 11.5–15.5)
RDW: 13.7 % (ref 11.5–15.5)
WBC: 8.1 10*3/uL (ref 4.0–10.5)
WBC: 8.4 10*3/uL (ref 4.0–10.5)
WBC: 10.5 10*3/uL (ref 4.0–10.5)
WBC: 11.5 10*3/uL — ABNORMAL HIGH (ref 4.0–10.5)

## 2010-10-31 LAB — LIPID PANEL
Cholesterol: 206 mg/dL — ABNORMAL HIGH (ref 0–200)
HDL: 64 mg/dL (ref >39)
LDL Cholesterol: 130 mg/dL — ABNORMAL HIGH (ref 0–99)
Total CHOL/HDL Ratio: 3.3 RATIO
Total CHOL/HDL Ratio: 3.4 RATIO
Triglycerides: 83 mg/dL (ref <150)
VLDL: 17 mg/dL (ref 0–40)

## 2010-10-31 LAB — DIFFERENTIAL
Basophils Absolute: 0 10*3/uL (ref 0.0–0.1)
Basophils Relative: 0 % (ref 0–1)
Eosinophils Absolute: 0.2 K/uL (ref 0.0–0.7)
Eosinophils Relative: 1 % (ref 0–5)
Lymphocytes Relative: 20 % (ref 12–46)
Lymphs Abs: 2.2 K/uL (ref 0.7–4.0)
Monocytes Absolute: 0.5 K/uL (ref 0.1–1.0)
Monocytes Relative: 5 % (ref 3–12)
Neutro Abs: 8.5 10*3/uL — ABNORMAL HIGH (ref 1.7–7.7)
Neutrophils Relative %: 74 % (ref 43–77)

## 2010-10-31 LAB — CARDIAC PANEL(CRET KIN+CKTOT+MB+TROPI)
CK, MB: 159.9 ng/mL — ABNORMAL HIGH (ref 0.3–4.0)
CK, MB: >300 ng/mL — ABNORMAL HIGH (ref 0.3–4.0)
Relative Index: >300 — ABNORMAL HIGH (ref 0.0–2.5)
Total CK: 4387 U/L — ABNORMAL HIGH (ref 7–177)
Total CK: 8034 U/L — ABNORMAL HIGH (ref 7–177)
Troponin I: >100 ng/mL (ref 0.00–0.06)
Troponin I: >100 ng/mL (ref 0.00–0.06)
Troponin I: >100 ng/mL (ref 0.00–0.06)

## 2010-10-31 LAB — COMPREHENSIVE METABOLIC PANEL
Alkaline Phosphatase: 125 U/L — ABNORMAL HIGH (ref 39–117)
BUN: 23 mg/dL (ref 6–23)
CO2: 23 mEq/L (ref 19–32)
Chloride: 101 mEq/L (ref 96–112)
GFR calc non Af Amer: 49 mL/min — ABNORMAL LOW (ref >60)
Glucose, Bld: 144 mg/dL — ABNORMAL HIGH (ref 70–99)
Potassium: 3.6 mEq/L (ref 3.5–5.1)
Total Bilirubin: 0.6 mg/dL (ref 0.3–1.2)

## 2010-10-31 LAB — POCT CARDIAC MARKERS
CKMB, poc: 11 ng/mL (ref 1.0–8.0)
Myoglobin, poc: 299 ng/mL (ref 12–200)
Troponin i, poc: 0.21 ng/mL — ABNORMAL HIGH (ref 0.00–0.09)

## 2010-10-31 LAB — PROTIME-INR
INR: 1 (ref 0.00–1.49)
Prothrombin Time: 12.7 seconds (ref 11.6–15.2)

## 2010-10-31 LAB — COMPREHENSIVE METABOLIC PANEL WITH GFR
ALT: 20 U/L (ref 0–35)
AST: 29 U/L (ref 0–37)
Albumin: 3.7 g/dL (ref 3.5–5.2)
Calcium: 8.7 mg/dL (ref 8.4–10.5)
Creatinine, Ser: 1.1 mg/dL (ref 0.4–1.2)
GFR calc Af Amer: 60 mL/min — ABNORMAL LOW (ref >60)
Sodium: 134 meq/L — ABNORMAL LOW (ref 135–145)
Total Protein: 6.9 g/dL (ref 6.0–8.3)

## 2010-10-31 LAB — MAGNESIUM: Magnesium: 2.2 mg/dL (ref 1.5–2.5)

## 2010-10-31 LAB — LIPASE, BLOOD: Lipase: 26 U/L (ref 11–59)

## 2010-10-31 LAB — D-DIMER, QUANTITATIVE: D-Dimer, Quant: 0.29 ug{FEU}/mL (ref 0.00–0.48)

## 2010-10-31 LAB — APTT: aPTT: 25 s (ref 24–37)

## 2010-10-31 LAB — BRAIN NATRIURETIC PEPTIDE: Pro B Natriuretic peptide (BNP): 772 pg/mL — ABNORMAL HIGH (ref 0.0–100.0)

## 2010-11-19 ENCOUNTER — Encounter: Payer: Self-pay | Admitting: Cardiovascular Disease

## 2010-11-20 ENCOUNTER — Ambulatory Visit (INDEPENDENT_AMBULATORY_CARE_PROVIDER_SITE_OTHER): Payer: Medicare Other | Admitting: Cardiovascular Disease

## 2010-11-20 ENCOUNTER — Encounter: Payer: Self-pay | Admitting: Cardiovascular Disease

## 2010-11-20 VITALS — BP 128/70 | HR 65 | Ht 67.0 in | Wt 147.0 lb

## 2010-11-20 DIAGNOSIS — I10 Essential (primary) hypertension: Secondary | ICD-10-CM

## 2010-11-20 DIAGNOSIS — I2510 Atherosclerotic heart disease of native coronary artery without angina pectoris: Secondary | ICD-10-CM

## 2010-11-20 DIAGNOSIS — E785 Hyperlipidemia, unspecified: Secondary | ICD-10-CM

## 2010-11-20 MED ORDER — NITROGLYCERIN 0.4 MG SL SUBL: 0.4000 mg | SUBLINGUAL_TABLET | SUBLINGUAL | Status: DC | PRN

## 2010-11-20 NOTE — Patient Instructions (Signed)
Your physician wants you to follow-up in: 6 months  You will receive a reminder letter in the mail two months in advance. If you don't receive a letter, please call our office to schedule the follow-up appointment.  Your physician recommends that you continue on your current medications as directed. Please refer to the Current Medication list given to you today.  

## 2010-11-20 NOTE — Progress Notes (Signed)
HPI:  This is a 70 year old woman who had an ST elevation MI treated with a bare-metal stent to the circumflex April 07, 2009. She initially did well, but was hospitalized for CHF and underwent repeat right and left heart cath showing LCx stent was widely patent and she has continued with medical management.  She presents today for follow-up evaluation.  The patient overall is doing well. She has occasions of chest tightness but this is not related to any exertion. 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 She denies dyspnea, edema, palpitations, orthopnea, or PND. She has no exertional chest pain or tightness. She has not required any nitroglycerin since her last visit.  Outpatient Encounter Prescriptions as of 11/20/2010  Medication Sig Dispense Refill  . aspirin 81 MG EC tablet Take 81 mg by mouth daily.        . carvedilol (COREG) 3.125 MG tablet Take 3.125 mg by mouth 2 (two) times daily with a meal.        . famotidine (PEPCID) 40 MG tablet Take 40 mg by mouth daily.        . furosemide (LASIX) 40 MG tablet 1 tab am and 1/2 pm       . losartan (COZAAR) 50 MG tablet Take 50 mg by mouth daily.        . nitroGLYCERIN (NITROSTAT) 0.4 MG SL tablet Place 0.4 mg under the tongue every 5 (five) minutes as needed.        . potassium chloride SA (K-DUR,KLOR-CON) 20 MEQ tablet Take 20 mEq by mouth daily.        . simvastatin (ZOCOR) 40 MG tablet Take 40 mg by mouth at bedtime.          No Known Allergies  Past Medical History  Diagnosis Date  . HTN (hypertension)   . Arthritis   . Macular degeneration   . Myocardial infarction     Acute myocardial infarction 2010 - treated with BMS of LCx. LVEF 50%, with subsequent CHF  . Lung cancer     Lung CA, s/p resection, followed by Dr Shirline Frees  . H/O: hysterectomy     ROS: Negative except as per HPI  BP 128/70  Pulse 65  Ht 5\' 7"  (1.702 m)  Wt 147 lb (66.679 kg)  BMI 23.02 kg/m2  PHYSICAL  EXAM: Pt is alert and oriented, NAD HEENT: normal Neck: JVP - normal, carotids 2+= without bruits Lungs: CTA bilaterally CV: RRR without murmur or gallop Abd: soft, NT, Positive BS, no hepatomegaly Ext: no C/C/E, distal pulses intact and equal Skin: warm/dry no rash  EKG:  Sinus rhythm, nonspecific ST and T wave abnormality  ASSESSMENT AND PLAN:

## 2010-11-20 NOTE — Assessment & Plan Note (Signed)
The patient is stable without angina. Will continue her current medical program. She is approaching 2 years out from her MI and was treated with a bare-metal stent. She is on aspirin 81 mg daily. I like to see her back in followup in 6 months.

## 2010-11-20 NOTE — Assessment & Plan Note (Signed)
Blood pressure is well controlled on current medical regimen. 

## 2010-11-20 NOTE — Assessment & Plan Note (Signed)
Lipids have been at goal. She is on statin therapy with Zocor 40 mg daily. We'll continue current medical program.

## 2010-11-26 ENCOUNTER — Telehealth: Payer: Self-pay | Admitting: Cardiovascular Disease

## 2010-11-26 NOTE — Telephone Encounter (Signed)
Pt states she going to have dental work Dec 16, 2010. Pt dentist wants to know if pt needs and meds before procedure. Dr. Elenora Gamma FAX# [phone].

## 2010-11-26 NOTE — Telephone Encounter (Signed)
The patient has a coronary stent. She does not require SBE prophylaxis.

## 2010-11-26 NOTE — Telephone Encounter (Signed)
I spoke with the pt's husband and made him aware the pt does not require SBE.  Phone note faxed to Dr Lanetta Inch.

## 2010-12-09 NOTE — Letter (Signed)
June 12, 2009   Lajuana Matte, MD  [phone] [address]  [place], [place] [address]   Re:  AMIEE, WILEY                DOB:  05/05/1941   Dear Arbutus Ped:   The patient came today after her bronchoscopy and mediastinoscopy.  In  bronchoscopy, there was some scarring in the right lower lobe, which we  biopsied and that was just a fibrosis.  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  So, I think this PET  scan was probably related to upper respiratory infection, which she  recently had.  Her mediastinoscopy site was healing well.  Her blood  pressure was 109/68, pulse 76, respirations 18, and sats were 92%.  I  plan to see her back again in 3 weeks.   Sincerely,   Ines Bloomer, M.D.  Electronically Signed   DPB/MEDQ  D:  06/12/2009  T:  06/13/2009  Job:  [id number]

## 2010-12-09 NOTE — Letter (Signed)
April 07, 2007   Lajuana Matte, MD  [phone] [address]  [place], [place] [address]   Re:  ALVERNA, FAWLEY                DOB:  July 07, 1941   Dear Arbutus Ped:   I saw Ms. Villavicencio back in the office today. Her CT scan showed no  evidence of recurrence or cancer. She is doing well except for a recent  URI. Her blood pressure was 157/89, pulse 64, respirations 18,  saturations were 97%. Lungs were clear to auscultation and percussion. I  had told her that she could use some Claritin or Zyrtec. Her medications  include Lopressor, Micardis and an ACE inhibitor. I will see her back  again in four months with a chest x-ray.   Ines Bloomer, M.D.  Electronically Signed   DPB/MEDQ  D:  04/07/2007  T:  04/08/2007  Job:  [id number]

## 2010-12-09 NOTE — Assessment & Plan Note (Signed)
OFFICE VISIT   [name], [name]  DOB:  [birth year]                                        December 29, 2006  CHART #:  [id number]   The patient came in for follow up today.  Her incisions were all healed.  Her blood pressure is 157/54, pulse 84, respiratory rate 18 and  saturations 96%.  Lungs were clear to auscultation and percussion.  Chest x-ray showed normal postoperative changes.  I will see her back  again in September with a chest x-ray.  She will get a CT scan by Dr.  Arbutus Ped in August or late September.   Ines Bloomer, M.D.  Electronically Signed   DPB/MEDQ  D:  12/29/2006  T:  12/29/2006  Job:  [id number]

## 2010-12-09 NOTE — Letter (Signed)
November 02, 2007   Mohamed K. Arbutus Ped, M.D.  [address]  [place], [place] [address]   Re:  DESTYNIE, TOOMEY                DOB:  Dec 20, 1940   Dear Arbutus Ped:   I saw Ms. Childers back in the office today and reviewed her CT scan.  Everything looks great.  She is doing well now a year since her surgery.  I think you will see her back again in 6 months with another CT scan.  Her blood pressure was 136/80, pulse 56, respirations 18, sats 95%.  I  will let you follow her, and I will be happy to see her again if she has  any evidence of recurrence.   Sincerely,   Ines Bloomer, M.D.  Electronically Signed   DPB/MEDQ  D:  11/02/2007  T:  11/02/2007  Job:  [id number]

## 2010-12-09 NOTE — Assessment & Plan Note (Signed)
OFFICE VISIT   TEKELIA, KAREEM  DOB:  Oct 05, 1940                                        July 09, 2009  CHART #:  [id number]   The patient comes for followup today.  Her blood pressure was 116/78,  pulse 68, respirations 18, and sats were 97%.  Her mediastinoscopy site  is well healed, although she did say it bled for a while, but she is  doing well and she will be seeing Dr. Arbutus Ped in the near future for  further followup, and I will see her again if we need to do any further  thoracic surgery.   Ines Bloomer, M.D.  Electronically Signed   DPB/MEDQ  D:  07/09/2009  T:  07/10/2009  Job:  [id number]   cc:   Lajuana Matte, MD

## 2010-12-09 NOTE — Letter (Signed)
August 02, 2007   Ralene Ok, M.D.  [address]  [place], [place] [address]   Re:  [name], [name]                DOB:  August 26, 1940   Dear Dr. Ludwig Clarks,   Today I saw [name] in clinic for follow-up for her lung  cancer.  Chest x-ray today shows no early postoperative changes.  Though  we had gotten a CT scan 3 months ago which was negative, it is time to  get a CT/ scan in 3 months for further follow-up to rule out any  recurrences.   Her blood pressure was 174/96, pulse 64, respirations 18, sats are 97%.  The only complaint was  swelling in her legs, and she has been treated  with diuretics.  I did suggest to her that if she continues to have the  swelling that she might benefit from a venous Doppler to look for any  type of venous insufficiency or DVT.   I appreciate the opportunity of seeing [name].   Sincerely,   Ines Bloomer, M.D.  Electronically Signed   DPB/MEDQ  D:  08/02/2007  T:  08/03/2007  Job:  [id number]

## 2010-12-09 NOTE — Letter (Signed)
June 04, 2009   Mohamed K. Arbutus Ped, MD  [address]  [place], [place] [address]   Re:  [name], [name]                DOB:  1940/12/20   Dear Arbutus Ped,   I saw the patient back today and her PET scan showed mediastinal  adenopathy.  She has also had a recent URI and recent cardiac problems  that has been treated by Dr. Excell Seltzer.  Her PET scan was positive of a 4R  node and so I have scheduled her tentatively for bronchoscopy with EBUS  and mediastinoscopy.  Her blood pressure is 106/66, pulse 64,  respirations 18, sats were 96%.  She is seeing Dr. Ludwig Clarks for her upper  respiratory infection.  I told her if this gets worse, we will put off  the evaluation.   [name], M.D.  Electronically Signed   DPB/MEDQ  D:  06/04/2009  T:  06/05/2009  Job:  [id number]   cc:   Veverly Fells. Excell Seltzer, MD

## 2010-12-12 NOTE — H&P (Signed)
[name], HALLIDAY NO.:  [id number]   MEDICAL RECORD NO.:  [id number]           PATIENT TYPE:   LOCATION:                                 FACILITY:   PHYSICIAN:  Ines Bloomer, M.D.      DATE OF BIRTH:   DATE OF ADMISSION:  08/23/2006  DATE OF DISCHARGE:                              HISTORY & PHYSICAL   CHIEF COMPLAINT:  Left lung mass.   HISTORY OF PRESENT ILLNESS:  This 70 year old patient's husband died of  lung cancer and has a long history of smoking.  She smokes half a pack  of cigarettes a day.  She has shortness of breath with exertion.  She  had a chest x-ray and a CT scan which reveals a right lower lobe 2 to 3-  cm lesion.  Pulmonary function test showed an FVC of 2.04 and FEV1 of  1.44.  She had a PET scan done which showed an SUV of 3.9 which is  indicative of cancer, but there is no evidence of spread to any other  areas.  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  She has had no  hemoptysis, fever or chills.  No weight loss.   PAST MEDICAL HISTORY:  She has no allergies.  She is on no medications.  She apparently has no other major medical problems.   FAMILY HISTORY:  Positive for coronary artery disease in her father and  mother and hypertension in her brother and diabetes and breast cancer in  her sister.  She does give a history of having a previous corneal  transplant.   SOCIAL HISTORY:  She is widowed, has one child.  Works in Engineering geologist.  Smokes a half pack of cigarettes a day.  Does not drink alcohol on a  regular basis.   REVIEW OF SYSTEMS:  __________ CARDIAC:  No angina, atrial fibrillation.  PULMONARY:  See history of present illness.  GI:  No nausea, vomiting,  constipation, or diarrhea.  GU:  No dysuria or frequent urination.  VASCULAR:  No claudication, DVT, or TIAs.  NEUROLOGIC:  No headaches,  blackout, or seizures.  MUSCULOSKELETAL:  No arthritis  or muscular pain.  PSYCHIATRIC:  No psychiatric noted.  ENT:  No change in her eyesight or  hearing.   PHYSICAL EXAMINATION:  She is a thin Caucasian female in no acute  distress.  Her blood pressure is 160/90, pulse 68, respirations 18, respirations  18, saturations are 98%.  Head is atraumatic.  Eyes:  Pupils are equal and reactive to light and  accommodation.  Extraocular movements are normal.  Ears:  Tympanic  membranes are intact.  Nose:  No septal deviation.  Throat:  Without  lesions.  Tongue is in the midline.  Uvula is in the midline.  NECK:  Supple without thyromegaly, no carotid bruits, no supraclavicular  or axillary adenopathy.  CHEST:  Some bilateral wheezes.  HEART:  Regular sinus rhythm, no murmurs.  BREASTS:  A left breast scar.  ABDOMEN:  Soft.  There is no hepatosplenomegaly.  Bowel sounds are  normal.  EXTREMITIES:  Pulse are 2+.  There is no clubbing, or edema.  Skin is  without lesions.   IMPRESSION:  1. Left lower lobe lesion, rule out cancer.  2. Chronic obstructive pulmonary disease.  3. Tobacco abuse.  4. History of breast biopsy.  5. History of corneal transplant.   PLAN:  Left VATS and left lower lobectomy.      Ines Bloomer, M.D.  Electronically Signed     DPB/MEDQ  D:  08/20/2006  T:  08/21/2006  Job:  [id number]

## 2010-12-12 NOTE — Discharge Summary (Signed)
[name], [name] NO.:  [id number]   MEDICAL RECORD NO.:  [id number]          PATIENT TYPE:  INP   LOCATION:  2005                         FACILITY:  [facility]   PHYSICIAN:  Ines Bloomer, M.D. DATE OF BIRTH:  07-09-1941   DATE OF ADMISSION:  09/01/2006  DATE OF DISCHARGE:  09/08/2006                               DISCHARGE SUMMARY   HISTORY OF PRESENT ILLNESS:  The patient is a 70 year old female who was  recently hospitalized from August 23, 2006 to August 28, 2006 for a  right lung mass.  On August 23, 2006 she underwent a right VATS  thoracotomy and right lower lobe superior segmentectomy with lymph node  dissection.  Pathology was possible for non-small-cell lung cancer stage  1A.  At the time of discharge, she had a small air leak in the chest  tube system and went home with the Mini Express 500 chest tube system.  On the day prior to this readmission, she was seen by Dr. Edwyna Shell in the  office and the chest tube was discontinued.  She had a chest x-ray  obtained on September 01, 2006 which revealed a 50-60% pneumothorax.  A  new chest tube was placed at the [facility] office by Dr. Edwyna Shell and she was  re-admitted to the hospital for further care.  For further details of  the patient's history and physical exam, please see the readmission note  done at the time of admission.   HOSPITAL COURSE:  The patient was followed closely with serial chest x-  rays.  She did have an episode of 15 beats of ventricular tachycardia  versus wide-complex tachycardia.  She has been started on Lopressor by  Dr. Edwyna Shell for this.  She has progressed well in her recovery and the  lung is currently expanded. On September 06, 2006, a Pneumostat device  was placed to the chest tube system, and at the time of discharge, the  current plan is for the patient to go home with a Pneumostat valve  unless the chest x-ray is fully expanded on September 08, 2006, at which  time Dr. Edwyna Shell may  take the tube out prior to discharge, but overall  the patient is felt to be stable for this discharge on September 08, 2006  pending this morning round reevaluation.   MEDICATIONS ON DISCHARGE:  Are as follows:  1. Norvasc 5 mg twice daily.  2. Metoprolol 25 mg twice daily for pain.  3. Tylox 1 or 2 every 6 hours as needed.   INSTRUCTIONS:  The patient will receive written instructions regarding  medications, activity, diet, wound care, and followup.   FOLLOWUP:  Appointment to see Dr. Edwyna Shell next week with follow-up chest  x-ray at that time.   FINAL DIAGNOSIS:  Recurrent right-sided pneumothorax status post chest  tube removal, placed originally for her thoracotomy.   OTHER DIAGNOSES:  Include:  1. Stage 1A lung carcinoma.  2. Hypertension.  3. A history of arthritis.  4. A history of previous surgeries, including left eye surgery for      corneal transplant.  5.  A history of macular degeneration.  6. Also has history of left breast biopsy 2007.  7. A history of a hysterectomy.      Rowe Clack, P.A.-C.      Ines Bloomer, M.D.  Electronically Signed    WEG/MEDQ  D:  09/07/2006  T:  09/08/2006  Job:  [id number]

## 2010-12-12 NOTE — Consult Note (Signed)
NAMELOYALTY, ARENTZ NO.:  [id number]   MEDICAL RECORD NO.:  [id number]          PATIENT TYPE:  EMS   LOCATION:  MAJO                         FACILITY:  [facility]   PHYSICIAN:  Kerin Perna, M.D.  DATE OF BIRTH:  [birth year]   DATE OF CONSULTATION:  09/11/2006  DATE OF DISCHARGE:  09/11/2006                                 CONSULTATION   PRIMARY CARE PHYSICIAN:  Ines Bloomer, M.D.   CONSULTANT:  Kerin Perna, M.D.   REASON FOR CONSULTATION:  Air leak from chest tube following thoracic  surgery.   CHIEF COMPLAINT:  Air is leaking out of my, around my chest tube.   PRESENT ILLNESS:  [name] is a 70 year old white female, ex-smoker,  who underwent right VATS and superior segment wedge resection of the  right lower lobe by Dr. Edwyna Shell on August 23, 2006, for a stage I non-  small cell carcinoma of the lung.  She did well initially and was sent  home in stable condition, but returned with shortness of breath and a  large right pneumothorax.  A right chest tube was placed by Dr. Edwyna Shell  on September 01, 2006, and she was admitted to the hospital.  The right  lung re-expanded, however, there is a persistent air leak and for that  reason the underwater seal Pleur-Evac was switched to a Pneumostat  device, and she was sent home with an expanded lung and a minimal air  leak.  After being home for 3 days, she heard some air moving in her  chest and she felt it was leaking around the chest tube site.  There is  no specific shortness of breath or pain.  She to reported to the  emergency department for thoracic surgical evaluation and a chest x-ray.   PAST MEDICAL HISTORY:  1. Hypertension.  2. Degenerative arthritis.  3. Macular degeneration.   CURRENT MEDICATIONS:  1. Tylox for pain.  2. Lopressor 25 mg b.i.d.  3. Norvasc 10 mg daily.   ALLERGIES:  NONE.   SOCIAL HISTORY:  The patient is a widow and has one son.  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  She does  not drink alcohol.   FAMILY HISTORY:  Positive for coronary disease, hypertension, and breast  cancer.   REVIEW OF SYSTEMS:  The patient apparently had an episode of a tachy  arrhythmia following her initial wedge resection and was placed on a  beta blocker (Lopressor) for a short term.  She otherwise did well with  the prior previous VATS pulmonary resection, other than the delayed air  leak and pneumothorax.  Her last chest x-ray was reviewed at the time of  discharge and the lung was fully expanded without infiltrate or  effusion.  She denies any fever since returning home and her appetite  has been good.   PHYSICAL EXAMINATION:  VITAL SIGNS:  Temperature 97, blood pressure  130/80, pulse 73 and regular, respirations 18, saturation on room air  97%.  GENERAL:  She is  a thin, middle-aged female, anxious but in no distress.  LUNGS:  Breath sounds are clear and equal.  CHEST:  The right mini-thoracotomy incision is well-healed.  The right  chest tube is secure and the entry site into the right pleural space is  clean without drainage or signs of cellulitis.  The connections of the  chest tube to the Pneumostat device are secure and taped.  There is no  air leaking around the tube or evidence of bubble air leak in the  Pneumostat chamber, which has approximately 5 mL of xanthochromic fluid.  The fluid in the chamber was drained with a Luer-Lok syringe and  discarded.  HEART:  Her cardiac rhythm is regular.  ABDOMEN:  Soft, nontender.  EXTREMITIES:  There is no cyanosis or edema of the extremities.  NEUROLOGIC:  Intact.   LABORATORY DATA:  PA and lateral chest x-ray reveals the right chest  tube to be in good position and there is no significant pneumothorax,  pleural effusion, or pulmonary infiltrate.   IMPRESSION:  The patient's chest tube system is working fine.  There is  no evidence of air leak around tube.   1. The patient and  family were reassured and a new dressing was      applied and the tube was secured.  2. The patient will return as scheduled to see Dr. Karle Plumber in      the office with a chest x-ray previously scheduled for February      19th.      Kerin Perna, M.D.  Electronically Signed     PV/MEDQ  D:  09/11/2006  T:  09/11/2006  Job:  [id number]   cc:   [facility] Office

## 2010-12-12 NOTE — Op Note (Signed)
[name], [name] NO.:  [id number]   MEDICAL RECORD NO.:  [id number]          PATIENT TYPE:  INP   LOCATION:  3315                         FACILITY:  [facility]   PHYSICIAN:  Ines Bloomer, M.D. DATE OF BIRTH:  01/11/1941   DATE OF PROCEDURE:  08/23/2006  DATE OF DISCHARGE:                               OPERATIVE REPORT   PREOPERATIVE DIAGNOSIS:  Right lower lobe lesion.   POSTOPERATIVE DIAGNOSIS:  Non-small cell cancer, right lower lobe.   OPERATION PERFORMED:  Right video-assisted thoracoscopic surgery, right  lower lobe superior segmentectomy with node dissection.   SURGEON:  Ines Bloomer, M.D.   ANESTHESIA:  General anesthesia.   DESCRIPTION OF PROCEDURE:  After percutaneous insertion of all  monitoring lines, the patient under general anesthesia was prepped and  draped in the usual sterile manner.  Two trocar sites were made, one in  the anterior axillary line at the seventh intercostal space, one at the  midaxillary line at the eighth intercostal space.  Two trocars were  inserted.  A lesion was seen in the superior segment of the right lower  lobe.  We made a 4 to 5-cm incision laterally, partially dividing the  latissimus, reflecting the serratus anteriorly and entering in the sixth  intercostal space.  A small Tuffier was placed in the space and  dissection was started in the fissure, dissecting out the pulmonary  artery, and then dissected down to the superior pulmonary artery, and  preserving the basilar branches.  We partially divided the fissure with  the EZ45 stapler, and then, dissecting down to the bronchus, we were  able to completely divide the superior portion of the fissure with the  EZ45 stapler.  There were 10R and 11R nodes that were taken.  Then, the  superior segment artery was divided with the Autosuture 30 white  Roticulator, and this left the bronchus, and this was divided with the  Autosuture green 45 stapler.  Finally,  the superior segmentectomy was  completed with an [phone] stapler with several applications, and in that  way, we got the superior segmental venous branch.  The specimen was sent  for frozen section, which revealed a non-small cell lung cancer with  negative bronchial margins.  We did a subcarina resection of several 7  lymph nodes, and then we put to chest tubes in, a right-angle chest tube  in the lateral trocar site, and anteriorly a straight chest tube, and  tied in place with 0 silk.  Marcaine block in the usual fashion.  The  single On-Q was inserted subpleurally and dosed with bupivacaine.  Three  holes were placed through the seventh rib, and then the paracostals were  wrapped around the seventh rib through the holes and around the sixth  rib.  Several stitches were placed along the staple line in the lower  lobe, where there was an air leak, and then  CoSeal was applied to the staple line.  The chest was closed with 3  paracostals, #1 Vicryl in the muscle layer, 2-0 Vicryl in the  subcutaneous tissue, and 3-0  Vicryl as a subcuticular stitch.  The  patient returned to the recovery room in stable condition.      Ines Bloomer, M.D.  Electronically Signed     DPB/MEDQ  D:  08/23/2006  T:  08/23/2006  Job:  [id number]   cc:   Dorita Sciara, MD

## 2010-12-17 ENCOUNTER — Telehealth: Payer: Self-pay | Admitting: Cardiovascular Disease

## 2010-12-17 DIAGNOSIS — K219 Gastro-esophageal reflux disease without esophagitis: Secondary | ICD-10-CM

## 2010-12-17 MED ORDER — FAMOTIDINE 40 MG PO TABS: 40.0000 mg | ORAL_TABLET | Freq: Every day | ORAL | Status: DC

## 2010-12-17 NOTE — Telephone Encounter (Signed)
Left message on pt's answering machine to make her aware that medication was e-prescribed to [facility].

## 2010-12-17 NOTE — Telephone Encounter (Signed)
Pt needs famotidine 40 mg to be call in to [facility] pharmacy/[place] #[phone]

## 2011-01-13 ENCOUNTER — Encounter: Payer: Self-pay | Admitting: Cardiovascular Disease

## 2011-02-12 ENCOUNTER — Other Ambulatory Visit: Payer: Self-pay | Admitting: Internal Medicine

## 2011-02-13 ENCOUNTER — Other Ambulatory Visit: Payer: Self-pay | Admitting: *Deleted

## 2011-02-13 MED ORDER — FUROSEMIDE 40 MG PO TABS: 40.0000 mg | ORAL_TABLET | ORAL | Status: DC

## 2011-02-16 ENCOUNTER — Encounter: Payer: Self-pay | Admitting: Cardiovascular Disease

## 2011-02-18 ENCOUNTER — Other Ambulatory Visit: Payer: Self-pay | Admitting: Internal Medicine

## 2011-02-24 ENCOUNTER — Other Ambulatory Visit: Payer: Medicare Other

## 2011-02-24 ENCOUNTER — Other Ambulatory Visit: Payer: Self-pay | Admitting: Cardiovascular Disease

## 2011-02-24 ENCOUNTER — Ambulatory Visit: Payer: Medicare Other

## 2011-02-25 ENCOUNTER — Other Ambulatory Visit: Payer: Self-pay | Admitting: *Deleted

## 2011-02-25 MED ORDER — CARVEDILOL 3.125 MG PO TABS: 3.1250 mg | ORAL_TABLET | Freq: Two times a day (BID) | ORAL | Status: DC

## 2011-02-27 ENCOUNTER — Other Ambulatory Visit: Payer: Medicare Other

## 2011-02-27 ENCOUNTER — Ambulatory Visit: Payer: Medicare Other

## 2011-03-12 ENCOUNTER — Ambulatory Visit
Admission: RE | Admit: 2011-03-12 | Discharge: 2011-03-12 | Disposition: A | Discharge status: Home / Self Care | Payer: Medicare Other | Source: Ambulatory Visit | Attending: Internal Medicine | Admitting: Internal Medicine

## 2011-04-06 ENCOUNTER — Encounter (HOSPITAL_BASED_OUTPATIENT_CLINIC_OR_DEPARTMENT_OTHER): Payer: Medicare Other | Admitting: Internal Medicine

## 2011-04-06 ENCOUNTER — Other Ambulatory Visit: Payer: Self-pay | Admitting: Internal Medicine

## 2011-04-06 ENCOUNTER — Ambulatory Visit (HOSPITAL_COMMUNITY)
Admission: RE | Admit: 2011-04-06 | Discharge: 2011-04-06 | Disposition: A | Discharge status: Home / Self Care | Payer: Medicare Other | Source: Ambulatory Visit | Attending: Internal Medicine | Admitting: Internal Medicine

## 2011-04-06 ENCOUNTER — Encounter (HOSPITAL_COMMUNITY): Payer: Self-pay

## 2011-04-06 DIAGNOSIS — I700 Atherosclerosis of aorta: Secondary | ICD-10-CM | POA: Insufficient documentation

## 2011-04-06 DIAGNOSIS — J438 Other emphysema: Secondary | ICD-10-CM | POA: Insufficient documentation

## 2011-04-06 DIAGNOSIS — I2510 Atherosclerotic heart disease of native coronary artery without angina pectoris: Secondary | ICD-10-CM | POA: Insufficient documentation

## 2011-04-06 DIAGNOSIS — Z902 Acquired absence of lung [part of]: Secondary | ICD-10-CM | POA: Insufficient documentation

## 2011-04-06 DIAGNOSIS — C3490 Malignant neoplasm of unspecified part of unspecified bronchus or lung: Secondary | ICD-10-CM | POA: Insufficient documentation

## 2011-04-06 DIAGNOSIS — J984 Other disorders of lung: Secondary | ICD-10-CM | POA: Insufficient documentation

## 2011-04-06 LAB — CBC WITH DIFFERENTIAL/PLATELET
Eosinophils Absolute: 0.4 10*3/uL (ref 0.0–0.5)
HCT: 39.6 % (ref 34.8–46.6)
LYMPH%: 39 % (ref 14.0–49.7)
MONO#: 0.6 10*3/uL (ref 0.1–0.9)
NEUT#: 2.9 10*3/uL (ref 1.5–6.5)
NEUT%: 45 % (ref 38.4–76.8)
Platelets: 278 10*3/uL (ref 145–400)
WBC: 6.5 10*3/uL (ref 3.9–10.3)

## 2011-04-06 LAB — CMP (CANCER CENTER ONLY)
CO2: 26 mEq/L (ref 18–33)
Creat: 0.8 mg/dl (ref 0.6–1.2)
Glucose, Bld: 100 mg/dL (ref 73–118)
Total Bilirubin: 0.6 mg/dl (ref 0.20–1.60)
Total Protein: 7 g/dL (ref 6.4–8.1)

## 2011-04-06 MED ORDER — IOHEXOL 300 MG/ML  SOLN
80.0000 mL | Freq: Once | INTRAMUSCULAR | Status: AC | PRN
  Administered 2011-04-06: 80 mL via INTRAVENOUS

## 2011-04-10 ENCOUNTER — Encounter: Payer: Self-pay | Admitting: Cardiovascular Disease

## 2011-04-10 ENCOUNTER — Encounter (INDEPENDENT_AMBULATORY_CARE_PROVIDER_SITE_OTHER): Payer: PRIVATE HEALTH INSURANCE

## 2011-04-10 DIAGNOSIS — R0989 Other specified symptoms and signs involving the circulatory and respiratory systems: Secondary | ICD-10-CM

## 2011-04-13 ENCOUNTER — Encounter (HOSPITAL_BASED_OUTPATIENT_CLINIC_OR_DEPARTMENT_OTHER): Payer: Medicare Other | Admitting: Internal Medicine

## 2011-04-13 DIAGNOSIS — Z8520 Personal history of malignant neoplasm of unspecified respiratory organ: Secondary | ICD-10-CM

## 2011-05-19 ENCOUNTER — Other Ambulatory Visit: Payer: Self-pay | Admitting: Cardiovascular Disease

## 2011-05-19 MED ORDER — LOSARTAN POTASSIUM 50 MG PO TABS: 50.0000 mg | ORAL_TABLET | Freq: Every day | ORAL | Status: DC

## 2011-05-21 ENCOUNTER — Ambulatory Visit (INDEPENDENT_AMBULATORY_CARE_PROVIDER_SITE_OTHER): Payer: Medicare Other | Admitting: Cardiovascular Disease

## 2011-05-21 ENCOUNTER — Encounter: Payer: Self-pay | Admitting: Cardiovascular Disease

## 2011-05-21 DIAGNOSIS — I10 Essential (primary) hypertension: Secondary | ICD-10-CM

## 2011-05-21 DIAGNOSIS — E785 Hyperlipidemia, unspecified: Secondary | ICD-10-CM

## 2011-05-21 DIAGNOSIS — I2510 Atherosclerotic heart disease of native coronary artery without angina pectoris: Secondary | ICD-10-CM

## 2011-05-21 NOTE — Assessment & Plan Note (Signed)
Lipids at goal and simvastatin. LFTs are normal.

## 2011-05-21 NOTE — Assessment & Plan Note (Signed)
The patient is stable without angina. She is on a stable medical regimen with aspirin for antiplatelet therapy, carvedilol, losartan, and simvastatin. She will followup in 6 months.

## 2011-05-21 NOTE — Progress Notes (Signed)
HPI:  This is a 70 year old woman presenting for followup evaluation. The patient has coronary artery disease and diastolic CHF. She presented with an inferolateral MI in 2010 and was treated with stenting to the left circumflex.  A bare metal stent platform was used.  The patient does not feel well. She has been fighting an upper respiratory infection for about a month. She is currently on antibiotics. She complains of nasal congestion, hoarseness, and cough. She denies chest pain. She has stable dyspnea with exertion. She denies edema or other complaints.  She had recent blood work showing normal renal function with a creatinine of 0.9, potassium 4.9, and excellent lipids with an LDL of 61 and an HDL of 70.  Outpatient Encounter Prescriptions as of 05/21/2011  Medication Sig Dispense Refill  . aspirin 81 MG EC tablet Take 81 mg by mouth daily.        . carvedilol (COREG) 3.125 MG tablet Take 1 tablet (3.125 mg total) by mouth 2 (two) times daily with a meal.  62 tablet  6  . famotidine (PEPCID) 40 MG tablet Take 1 tablet (40 mg total) by mouth daily.  30 tablet  11  . furosemide (LASIX) 40 MG tablet Take 1 tablet (40 mg total) by mouth as directed. 1 tab am and 1/2 pm  45 tablet  10  . losartan (COZAAR) 50 MG tablet Take 1 tablet (50 mg total) by mouth daily.  30 tablet  6  . nitroGLYCERIN (NITROSTAT) 0.4 MG SL tablet Place 1 tablet (0.4 mg total) under the tongue every 5 (five) minutes as needed.  25 tablet  2  . potassium chloride SA (K-DUR,KLOR-CON) 20 MEQ tablet Take 20 mEq by mouth daily.        . simvastatin (ZOCOR) 40 MG tablet Take 40 mg by mouth at bedtime.          No Known Allergies  Past Medical History  Diagnosis Date  . HTN (hypertension)   . Arthritis   . Macular degeneration   . Myocardial infarction     Acute myocardial infarction 2010 - treated with BMS of LCx. LVEF 50%, with subsequent CHF  . H/O: hysterectomy   . Lung cancer dx'd 07/2006    Lung CA, s/p resection,  followed by Dr Shirline Frees    ROS: Negative except as per HPI  BP 128/80  Pulse 58  Resp 18  Ht 5\' 7"  (1.702 m)  Wt 142 lb 6.4 oz (64.592 kg)  BMI 22.30 kg/m2  PHYSICAL EXAM: Pt is alert and oriented, NAD, voice is hoarse. HEENT: normal Neck: JVP - normal, carotids 2+= without bruits Lungs: CTA bilaterally CV: RRR without murmur or gallop Abd: soft, NT, Positive BS, no hepatomegaly Ext: no C/C/E, distal pulses intact and equal Skin: warm/dry no rash  EKG:  Normal sinus rhythm 58 beats per minute, nonspecific ST and T wave abnormality, otherwise within normal limits.  ASSESSMENT AND PLAN:

## 2011-05-21 NOTE — Assessment & Plan Note (Signed)
Well-controlled on current medical therapy. 

## 2011-05-21 NOTE — Patient Instructions (Signed)
Your physician wants you to follow-up in: 6 MONTHS.  You will receive a reminder letter in the mail two months in advance. If you don't receive a letter, please call our office to schedule the follow-up appointment.  Your physician recommends that you continue on your current medications as directed. Please refer to the Current Medication list given to you today.  

## 2011-05-28 ENCOUNTER — Encounter: Payer: Self-pay | Admitting: Cardiovascular Disease

## 2011-06-16 ENCOUNTER — Other Ambulatory Visit: Payer: Self-pay | Admitting: Cardiovascular Disease

## 2011-06-16 MED ORDER — POTASSIUM CHLORIDE CRYS ER 20 MEQ PO TBCR: 20.0000 meq | EXTENDED_RELEASE_TABLET | Freq: Every day | ORAL | Status: DC

## 2011-09-24 ENCOUNTER — Encounter: Payer: Self-pay | Admitting: Cardiovascular Disease

## 2011-10-02 ENCOUNTER — Encounter: Payer: Self-pay | Admitting: Cardiovascular Disease

## 2011-10-27 ENCOUNTER — Other Ambulatory Visit: Payer: Self-pay

## 2011-10-27 MED ORDER — CARVEDILOL 3.125 MG PO TABS: 3.1250 mg | ORAL_TABLET | Freq: Two times a day (BID) | ORAL | Status: DC

## 2011-10-27 NOTE — Telephone Encounter (Signed)
..   Requested Prescriptions   Signed Prescriptions Disp Refills  . carvedilol (COREG) 3.125 MG tablet 60 tablet 4    Sig: Take 1 tablet (3.125 mg total) by mouth 2 (two) times daily with a meal.    Authorizing Provider: Tonny Bollman    Ordering User: Christella Hartigan, Son Barkan Judie Petit

## 2011-11-06 ENCOUNTER — Ambulatory Visit (INDEPENDENT_AMBULATORY_CARE_PROVIDER_SITE_OTHER): Payer: Medicare Other | Admitting: Cardiovascular Disease

## 2011-11-06 ENCOUNTER — Encounter: Payer: Self-pay | Admitting: Cardiovascular Disease

## 2011-11-06 VITALS — BP 120/78 | HR 60 | Ht 67.0 in | Wt 142.0 lb

## 2011-11-06 DIAGNOSIS — I501 Left ventricular failure: Secondary | ICD-10-CM

## 2011-11-06 DIAGNOSIS — I2510 Atherosclerotic heart disease of native coronary artery without angina pectoris: Secondary | ICD-10-CM

## 2011-11-06 DIAGNOSIS — I509 Heart failure, unspecified: Secondary | ICD-10-CM

## 2011-11-06 DIAGNOSIS — I5032 Chronic diastolic (congestive) heart failure: Secondary | ICD-10-CM

## 2011-11-06 DIAGNOSIS — E785 Hyperlipidemia, unspecified: Secondary | ICD-10-CM

## 2011-11-06 DIAGNOSIS — I10 Essential (primary) hypertension: Secondary | ICD-10-CM

## 2011-11-06 MED ORDER — CARVEDILOL 6.25 MG PO TABS: 6.2500 mg | ORAL_TABLET | Freq: Two times a day (BID) | ORAL | Status: DC

## 2011-11-06 NOTE — Assessment & Plan Note (Signed)
Well-controlled on current medical program. 

## 2011-11-06 NOTE — Progress Notes (Signed)
   HPI:  71 year old woman presenting for followup of coronary artery disease and mixed heart failure. She initially presented with an inferolateral MI in 2010. She was treated with a bare-metal stent the left circumflex. She had a subsequent hospitalization with heart failure and was noted to have mild LV dysfunction with an ejection fraction of 45%. She presents today for followup evaluation.  The patient has had an upper respiratory infection and continues to have nasal congestion and voice changes. She otherwise has no complaints. She feels very well overall. She specifically denies chest pain, chest pressure, edema, or palpitations. She's had some shortness of breath related to her URI but this is resolved after a course of prednisone which she just finished yesterday. She has been compliant with her medications. She is involved in a clinical research study (SOLID - Darapladib vs placebo) and her labs are monitored through the study.  Outpatient Encounter Prescriptions as of 11/06/2011  Medication Sig Dispense Refill  . aspirin 81 MG EC tablet Take 81 mg by mouth daily.        . carvedilol (COREG) 3.125 MG tablet Take 1 tablet (3.125 mg total) by mouth 2 (two) times daily with a meal.  60 tablet  4  . famotidine (PEPCID) 40 MG tablet Take 1 tablet (40 mg total) by mouth daily.  30 tablet  11  . furosemide (LASIX) 40 MG tablet Take 1 tablet (40 mg total) by mouth as directed. 1 tab am and 1/2 pm  45 tablet  10  . losartan (COZAAR) 50 MG tablet Take 1 tablet (50 mg total) by mouth daily.  30 tablet  6  . nitroGLYCERIN (NITROSTAT) 0.4 MG SL tablet Place 1 tablet (0.4 mg total) under the tongue every 5 (five) minutes as needed.  25 tablet  2  . potassium chloride SA (K-DUR,KLOR-CON) 20 MEQ tablet Take 1 tablet (20 mEq total) by mouth daily.  30 tablet  11  . simvastatin (ZOCOR) 40 MG tablet Take 40 mg by mouth at bedtime.          No Known Allergies  Past Medical History  Diagnosis Date  . HTN  (hypertension)   . Arthritis   . Macular degeneration   . Myocardial infarction     Acute myocardial infarction 2010 - treated with BMS of LCx. LVEF 50%, with subsequent CHF  . H/O: hysterectomy   . Lung cancer dx'd 07/2006    Lung CA, s/p resection, followed by Dr Shirline Frees    ROS: Negative except as per HPI  BP 120/78  Pulse 60  Ht 5\' 7"  (1.702 m)  Wt 64.411 kg (142 lb)  BMI 22.24 kg/m2  PHYSICAL EXAM: Pt is alert and oriented, very pleasant woman in NAD HEENT: normal Neck: JVP - normal, carotids 2+= without bruits Lungs: CTA bilaterally CV: RRR without murmur or gallop Abd: soft, NT, Positive BS, no hepatomegaly Ext: no C/C/E, distal pulses intact and equal Skin: warm/dry no rash  EKG:  Sinus rhythm 60 beats per minute, occasional PVCs, nonspecific ST and T wave abnormality.  ASSESSMENT AND PLAN:

## 2011-11-06 NOTE — Patient Instructions (Signed)
Your physician has recommended you make the following change in your medication: INCREASE Carvedilol to 6.25mg  take one by mouth twice a day  Your physician wants you to follow-up in: 6 MONTHS.  You will receive a reminder letter in the mail two months in advance. If you don't receive a letter, please call our office to schedule the follow-up appointment.

## 2011-11-06 NOTE — Assessment & Plan Note (Signed)
The patient has chronic mixed heart failure with mild LV dysfunction related to her inferoposterior infarct, currently well compensated. She is on appropriate medical therapy. Will increase her carvedilol to 6.25 mg twice daily.

## 2011-11-06 NOTE — Assessment & Plan Note (Signed)
Lipids are at goal. They are followed through the research protocol.

## 2011-11-06 NOTE — Assessment & Plan Note (Signed)
The patient is stable without anginal symptoms. She will continue her current medical regimen which includes aspirin for antiplatelet therapy, carvedilol of which I will increase the dose, and losartan. I would like to see her back in 6 months for followup.

## 2011-12-24 ENCOUNTER — Other Ambulatory Visit: Payer: Self-pay | Admitting: Cardiovascular Disease

## 2011-12-24 DIAGNOSIS — K219 Gastro-esophageal reflux disease without esophagitis: Secondary | ICD-10-CM

## 2011-12-24 NOTE — Telephone Encounter (Signed)
Refill- famotidine (PEPCID) 40 MG tablet         - losartan (COZAAR) 50 MG tablet   Verified preferred as [employer]

## 2011-12-25 MED ORDER — LOSARTAN POTASSIUM 50 MG PO TABS: 50.0000 mg | ORAL_TABLET | Freq: Every day | ORAL | Status: DC

## 2011-12-25 MED ORDER — FAMOTIDINE 40 MG PO TABS: 40.0000 mg | ORAL_TABLET | Freq: Every day | ORAL | Status: DC

## 2012-01-18 ENCOUNTER — Other Ambulatory Visit: Payer: Self-pay | Admitting: *Deleted

## 2012-01-18 MED ORDER — FUROSEMIDE 40 MG PO TABS: 40.0000 mg | ORAL_TABLET | ORAL | Status: DC

## 2012-02-02 ENCOUNTER — Other Ambulatory Visit: Payer: Self-pay | Admitting: Internal Medicine

## 2012-02-02 DIAGNOSIS — Z1231 Encounter for screening mammogram for malignant neoplasm of breast: Secondary | ICD-10-CM

## 2012-02-08 ENCOUNTER — Other Ambulatory Visit: Payer: Self-pay | Admitting: *Deleted

## 2012-02-08 MED ORDER — SIMVASTATIN 40 MG PO TABS: 40.0000 mg | ORAL_TABLET | Freq: Every day | ORAL | Status: DC

## 2012-02-10 ENCOUNTER — Other Ambulatory Visit: Payer: Self-pay | Admitting: Internal Medicine

## 2012-02-10 DIAGNOSIS — R531 Weakness: Secondary | ICD-10-CM

## 2012-02-14 ENCOUNTER — Other Ambulatory Visit: Payer: Medicare Other

## 2012-02-14 ENCOUNTER — Ambulatory Visit
Admission: RE | Admit: 2012-02-14 | Discharge: 2012-02-14 | Disposition: A | Discharge status: Home / Self Care | Payer: Medicare Other | Source: Ambulatory Visit | Attending: Internal Medicine | Admitting: Internal Medicine

## 2012-02-14 DIAGNOSIS — R531 Weakness: Secondary | ICD-10-CM

## 2012-02-18 ENCOUNTER — Ambulatory Visit
Admission: RE | Admit: 2012-02-18 | Discharge: 2012-02-18 | Disposition: A | Discharge status: Home / Self Care | Payer: Medicare Other | Source: Ambulatory Visit | Attending: Internal Medicine | Admitting: Internal Medicine

## 2012-02-18 MED ORDER — GADOBENATE DIMEGLUMINE 529 MG/ML IV SOLN
13.0000 mL | Freq: Once | INTRAVENOUS | Status: AC | PRN
  Administered 2012-02-18: 13 mL via INTRAVENOUS

## 2012-02-25 ENCOUNTER — Other Ambulatory Visit: Payer: Self-pay | Admitting: Cardiology

## 2012-02-25 DIAGNOSIS — I2510 Atherosclerotic heart disease of native coronary artery without angina pectoris: Secondary | ICD-10-CM

## 2012-02-26 ENCOUNTER — Other Ambulatory Visit (HOSPITAL_COMMUNITY): Payer: Self-pay | Admitting: Internal Medicine

## 2012-02-26 ENCOUNTER — Ambulatory Visit (HOSPITAL_COMMUNITY): Payer: Medicare Other | Attending: Cardiology | Admitting: Radiology

## 2012-02-26 DIAGNOSIS — I517 Cardiomegaly: Secondary | ICD-10-CM | POA: Insufficient documentation

## 2012-02-26 DIAGNOSIS — I10 Essential (primary) hypertension: Secondary | ICD-10-CM | POA: Insufficient documentation

## 2012-02-26 DIAGNOSIS — E785 Hyperlipidemia, unspecified: Secondary | ICD-10-CM | POA: Insufficient documentation

## 2012-02-26 DIAGNOSIS — I2510 Atherosclerotic heart disease of native coronary artery without angina pectoris: Secondary | ICD-10-CM | POA: Insufficient documentation

## 2012-02-26 DIAGNOSIS — I252 Old myocardial infarction: Secondary | ICD-10-CM | POA: Insufficient documentation

## 2012-02-26 DIAGNOSIS — I059 Rheumatic mitral valve disease, unspecified: Secondary | ICD-10-CM | POA: Insufficient documentation

## 2012-02-26 DIAGNOSIS — Z8673 Personal history of transient ischemic attack (TIA), and cerebral infarction without residual deficits: Secondary | ICD-10-CM | POA: Insufficient documentation

## 2012-02-26 DIAGNOSIS — I509 Heart failure, unspecified: Secondary | ICD-10-CM

## 2012-02-26 NOTE — Progress Notes (Signed)
Echocardiogram performed.  

## 2012-02-29 ENCOUNTER — Encounter (INDEPENDENT_AMBULATORY_CARE_PROVIDER_SITE_OTHER): Payer: Medicare Other

## 2012-02-29 ENCOUNTER — Encounter (HOSPITAL_COMMUNITY): Payer: Self-pay | Admitting: Internal Medicine

## 2012-02-29 ENCOUNTER — Other Ambulatory Visit: Payer: Self-pay | Admitting: Neurology

## 2012-02-29 ENCOUNTER — Telehealth: Payer: Self-pay | Admitting: Cardiovascular Disease

## 2012-02-29 DIAGNOSIS — I2510 Atherosclerotic heart disease of native coronary artery without angina pectoris: Secondary | ICD-10-CM

## 2012-02-29 DIAGNOSIS — H53129 Transient visual loss, unspecified eye: Secondary | ICD-10-CM

## 2012-02-29 DIAGNOSIS — I639 Cerebral infarction, unspecified: Secondary | ICD-10-CM

## 2012-02-29 DIAGNOSIS — R209 Unspecified disturbances of skin sensation: Secondary | ICD-10-CM

## 2012-02-29 DIAGNOSIS — I10 Essential (primary) hypertension: Secondary | ICD-10-CM

## 2012-02-29 DIAGNOSIS — E785 Hyperlipidemia, unspecified: Secondary | ICD-10-CM

## 2012-02-29 NOTE — Telephone Encounter (Signed)
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 Imaging findings correspond with an infarct of approximately 1 week duration.  I spoke with the pt and she is concerned because of instructions she received from Dr Debarah Crape [facility]) at her appointment on Friday.  Dr Debarah Crape instructed the pt to stop taking ASA and she does not understand why she needs to stop this medication. I made the pt aware that she needs to follow this instruction due to hemorrhagic infarct. She also said amlodipine is listed as an allergy on her chart at [facility].  The pt said she is not allergic to this medication.  I made her aware that she needs to call [facility] and have them update her chart. The pt did have an echo performed on 02/26/12 and is scheduled for a carotid today (ordered by PCP).  I will forward this message to Dr Excell Seltzer so he can review the pt's echo results (decreased EF).

## 2012-02-29 NOTE — Telephone Encounter (Signed)
Please return call to patient [phone], she would like to speak with the doctor while she is here for a carotid examination later on today (3:30)

## 2012-03-01 NOTE — Telephone Encounter (Signed)
Old echos report LVEF 45-50, but poor acoustic windows. I need to review studies to compare when I get back.

## 2012-03-05 ENCOUNTER — Ambulatory Visit
Admission: RE | Admit: 2012-03-05 | Discharge: 2012-03-05 | Disposition: A | Discharge status: Home / Self Care | Payer: Medicare Other | Source: Ambulatory Visit | Attending: Neurology | Admitting: Neurology

## 2012-03-05 DIAGNOSIS — I639 Cerebral infarction, unspecified: Secondary | ICD-10-CM

## 2012-03-05 DIAGNOSIS — I10 Essential (primary) hypertension: Secondary | ICD-10-CM

## 2012-03-05 DIAGNOSIS — E785 Hyperlipidemia, unspecified: Secondary | ICD-10-CM

## 2012-03-11 ENCOUNTER — Other Ambulatory Visit: Payer: Self-pay | Admitting: Internal Medicine

## 2012-03-11 ENCOUNTER — Telehealth: Payer: Self-pay | Admitting: Internal Medicine

## 2012-03-11 DIAGNOSIS — C3490 Malignant neoplasm of unspecified part of unspecified bronchus or lung: Secondary | ICD-10-CM

## 2012-03-11 NOTE — Telephone Encounter (Signed)
lmonvm for pt re appts for lb/ct 9/23 and MM 9/26. Schedule mailed.

## 2012-03-14 ENCOUNTER — Ambulatory Visit: Payer: Medicare Other

## 2012-03-14 ENCOUNTER — Ambulatory Visit
Admission: RE | Admit: 2012-03-14 | Discharge: 2012-03-14 | Disposition: A | Discharge status: Home / Self Care | Payer: Medicare Other | Source: Ambulatory Visit | Attending: Internal Medicine | Admitting: Internal Medicine

## 2012-03-14 DIAGNOSIS — Z1231 Encounter for screening mammogram for malignant neoplasm of breast: Secondary | ICD-10-CM

## 2012-03-31 ENCOUNTER — Encounter (INDEPENDENT_AMBULATORY_CARE_PROVIDER_SITE_OTHER): Payer: Medicare Other

## 2012-03-31 DIAGNOSIS — R0989 Other specified symptoms and signs involving the circulatory and respiratory systems: Secondary | ICD-10-CM

## 2012-04-18 ENCOUNTER — Other Ambulatory Visit (HOSPITAL_BASED_OUTPATIENT_CLINIC_OR_DEPARTMENT_OTHER): Payer: Medicare Other | Admitting: Lab

## 2012-04-18 ENCOUNTER — Ambulatory Visit (HOSPITAL_COMMUNITY)
Admission: RE | Admit: 2012-04-18 | Discharge: 2012-04-18 | Disposition: A | Discharge status: Home / Self Care | Payer: Medicare Other | Source: Ambulatory Visit | Attending: Internal Medicine | Admitting: Internal Medicine

## 2012-04-18 DIAGNOSIS — R599 Enlarged lymph nodes, unspecified: Secondary | ICD-10-CM

## 2012-04-18 DIAGNOSIS — I2510 Atherosclerotic heart disease of native coronary artery without angina pectoris: Secondary | ICD-10-CM | POA: Insufficient documentation

## 2012-04-18 DIAGNOSIS — K449 Diaphragmatic hernia without obstruction or gangrene: Secondary | ICD-10-CM | POA: Insufficient documentation

## 2012-04-18 DIAGNOSIS — J438 Other emphysema: Secondary | ICD-10-CM | POA: Insufficient documentation

## 2012-04-18 DIAGNOSIS — C3490 Malignant neoplasm of unspecified part of unspecified bronchus or lung: Secondary | ICD-10-CM | POA: Insufficient documentation

## 2012-04-18 DIAGNOSIS — Z902 Acquired absence of lung [part of]: Secondary | ICD-10-CM | POA: Insufficient documentation

## 2012-04-18 LAB — CBC WITH DIFFERENTIAL/PLATELET
EOS%: 6.6 % (ref 0.0–7.0)
Eosinophils Absolute: 0.4 10*3/uL (ref 0.0–0.5)
LYMPH%: 39.8 % (ref 14.0–49.7)
MCH: 32.4 pg (ref 25.1–34.0)
MCV: 95.2 fL (ref 79.5–101.0)
MONO%: 8.6 % (ref 0.0–14.0)
Platelets: 197 10*3/uL (ref 145–400)
RBC: 4.18 10*6/uL (ref 3.70–5.45)
RDW: 12.8 % (ref 11.2–14.5)

## 2012-04-18 LAB — COMPREHENSIVE METABOLIC PANEL (CC13)
AST: 24 U/L (ref 5–34)
Albumin: 3.7 g/dL (ref 3.5–5.0)
Alkaline Phosphatase: 110 U/L (ref 40–150)
BUN: 24 mg/dL (ref 7.0–26.0)
Glucose: 102 mg/dl — ABNORMAL HIGH (ref 70–99)
Potassium: 4.5 mEq/L (ref 3.5–5.1)
Sodium: 138 mEq/L (ref 136–145)
Total Bilirubin: 0.7 mg/dL (ref 0.20–1.20)

## 2012-04-21 ENCOUNTER — Ambulatory Visit (HOSPITAL_BASED_OUTPATIENT_CLINIC_OR_DEPARTMENT_OTHER): Payer: Medicare Other | Admitting: Internal Medicine

## 2012-04-21 ENCOUNTER — Telehealth: Payer: Self-pay | Admitting: Internal Medicine

## 2012-04-21 VITALS — BP 127/71 | HR 52 | Temp 97.1°F | Resp 20 | Ht 67.0 in | Wt 141.0 lb

## 2012-04-21 DIAGNOSIS — C3430 Malignant neoplasm of lower lobe, unspecified bronchus or lung: Secondary | ICD-10-CM

## 2012-04-21 DIAGNOSIS — C3490 Malignant neoplasm of unspecified part of unspecified bronchus or lung: Secondary | ICD-10-CM

## 2012-04-21 DIAGNOSIS — C3431 Malignant neoplasm of lower lobe, right bronchus or lung: Secondary | ICD-10-CM | POA: Insufficient documentation

## 2012-04-21 NOTE — Patient Instructions (Signed)
Your CT scan of the chest showed no evidence for disease recurrence. Followup in one year with repeat CT of the chest.

## 2012-04-21 NOTE — Telephone Encounter (Signed)
Printed and gv appt for Sept

## 2012-04-21 NOTE — Progress Notes (Signed)
[facility] [phone]   [phone]  OFFICE PROGRESS NOTE  DIAGNOSIS: Stage IA (T1a, N0, MX) non-small cell lung cancer, adenocarcinoma diagnosed in November 2007.  PRIOR THERAPY: Status post right lower lobe superior segmentectomy with lymph node dissection under the care of Dr. Edwyna Shell on 08/15/2006.  CURRENT THERAPY: Observation.  INTERVAL HISTORY: [name] 71 y.o. female returns to the clinic today for routine annual followup visit. The patient is feeling fine today with no specific complaints. She denied having any significant chest pain, shortness breath, cough or hemoptysis. She denied having any significant weight loss or night sweats. She was diagnosed with a stroke months ago but recovering well. The patient has repeat CT scan of the chest performed recently and she is here today for evaluation and discussion of her scan results.  MEDICAL HISTORY: Past Medical History  Diagnosis Date  . HTN (hypertension)   . Arthritis   . Macular degeneration   . Myocardial infarction     Acute myocardial infarction 2010 - treated with BMS of LCx. LVEF 50%, with subsequent CHF  . H/O: hysterectomy   . Lung cancer dx'd 07/2006    Lung CA, s/p resection, followed by Dr Shirline Frees    ALLERGIES:   has no known allergies.  MEDICATIONS:  Current Outpatient Prescriptions  Medication Sig Dispense Refill  . carvedilol (COREG) 6.25 MG tablet Take 1 tablet (6.25 mg total) by mouth 2 (two) times daily with a meal.  60 tablet  11  . famotidine (PEPCID) 40 MG tablet Take 1 tablet (40 mg total) by mouth daily.  30 tablet  11  . furosemide (LASIX) 40 MG tablet Take 1 tablet (40 mg total) by mouth as directed. 1 tab am and 1/2 pm  45 tablet  10  . losartan (COZAAR) 50 MG tablet Take 1 tablet (50 mg total) by mouth daily.  30 tablet  6  . potassium chloride SA (K-DUR,KLOR-CON) 20 MEQ tablet Take 1 tablet (20 mEq total) by mouth daily.  30 tablet  11  .  simvastatin (ZOCOR) 40 MG tablet Take 1 tablet (40 mg total) by mouth at bedtime.  30 tablet  9  . nitroGLYCERIN (NITROSTAT) 0.4 MG SL tablet Place 1 tablet (0.4 mg total) under the tongue every 5 (five) minutes as needed.  25 tablet  2    REVIEW OF SYSTEMS:  A comprehensive review of systems was negative.   PHYSICAL EXAMINATION: General appearance: alert, cooperative and no distress Neck: no adenopathy Lymph nodes: Cervical, supraclavicular, and axillary nodes normal. Resp: clear to auscultation bilaterally Cardio: regular rate and rhythm, S1, S2 normal, no murmur, click, rub or gallop GI: soft, non-tender; bowel sounds normal; no masses,  no organomegaly Extremities: extremities normal, atraumatic, no cyanosis or edema  ECOG PERFORMANCE STATUS: 1 - Symptomatic but completely ambulatory  Blood pressure 127/71, pulse 52, temperature 97.1 F (36.2 C), temperature source Oral, resp. rate 20, height 5\' 7"  (1.702 m), weight 141 lb (63.957 kg).  LABORATORY DATA: Lab Results  Component Value Date   WBC 6.4 04/18/2012   HGB 13.6 04/18/2012   HCT 39.8 04/18/2012   MCV 95.2 04/18/2012   PLT 197 04/18/2012      Chemistry      Component Value Date/Time   NA 138 04/18/2012 1057   NA 134 04/06/2011 1312   NA 140 04/07/2010 1002   K 4.5 04/18/2012 1057   K 4.5 04/06/2011 1312   K 4.4 04/07/2010  1002   CL 102 04/18/2012 1057   CL 98 04/06/2011 1312   CL 104 04/07/2010 1002   CO2 26 04/18/2012 1057   CO2 26 04/06/2011 1312   CO2 30 04/07/2010 1002   BUN 24.0 04/18/2012 1057   BUN 20 04/06/2011 1312   BUN 19 04/07/2010 1002   CREATININE 1.0 04/18/2012 1057   CREATININE 0.8 04/06/2011 1312   CREATININE 0.98 04/07/2010 1002      Component Value Date/Time   CALCIUM 10.0 04/18/2012 1057   CALCIUM 9.4 04/06/2011 1312   CALCIUM 9.2 04/07/2010 1002   ALKPHOS 110 04/18/2012 1057   ALKPHOS 98* 04/06/2011 1312   ALKPHOS 100 04/07/2010 1002   AST 24 04/18/2012 1057   AST 23 04/06/2011 1312   AST 30 04/07/2010 1002     ALT 20 04/18/2012 1057   ALT 27 04/07/2010 1002   BILITOT 0.70 04/18/2012 1057   BILITOT 0.60 04/06/2011 1312   BILITOT 0.8 04/07/2010 1002       RADIOGRAPHIC STUDIES: Ct Chest Wo Contrast  04/18/2012  *RADIOLOGY REPORT*  Clinical Data: Lung cancer, status post right lower lobectomy. Left-sided lumpectomy.  No current complaints.  CT CHEST WITHOUT CONTRAST  Technique:  Multidetector CT imaging of the chest was performed following the standard protocol without IV contrast.  Comparison: 04/06/2011  Findings: Lung windows demonstrate minimal secretions in the dependent trachea.  Surgical changes in the right lower lobe. Moderate centrilobular emphysema with mild pleural parenchymal scarring at the lung apices. 4 mm ill-defined right upper lobe lung nodule which is unchanged on image 21.  Soft tissue windows demonstrate stable small left supraclavicular node.  Normal heart size with multivessel coronary artery atherosclerosis. No pericardial or pleural effusion.  Stable right paratracheal node 8 mm. No mediastinal or definite hilar adenopathy, given limitations of unenhanced CT.  Moderate hiatal hernia.  Air fluid level within the thoracic esophagus on image 24.  Limited abdominal imaging demonstrates normal adrenal glands.  No significant findings.  No acute osseous abnormality.  IMPRESSION:  1.  Surgical changes of right lower lobectomy.  No evidence of recurrent or metastatic disease. 2.  Moderate centrilobular emphysema. 3.  Small hiatal hernia. 4. Esophageal air fluid level suggests dysmotility or gastroesophageal reflux.   Original Report Authenticated By: Consuello Bossier, M.D.     ASSESSMENT: This is a very pleasant 71 years old white female with history of stage IA non-small cell lung cancer status post right lower lobe superior segmentectomy with lymph node dissection on general 2008 under the care of Dr. Edwyna Shell. The patient has been observation since that time was no evidence for disease  recurrence.  PLAN: I recommended for her continuous observation for now. She would have repeat CT scan of the chest without contrast in one year and she would come back for followup visit at that time.  The patient was advised to call me immediately if she has any concerning symptoms in the interval.  All questions were answered. The patient knows to call the clinic with any problems, questions or concerns. We can certainly see the patient much sooner if necessary.

## 2012-05-06 ENCOUNTER — Ambulatory Visit (INDEPENDENT_AMBULATORY_CARE_PROVIDER_SITE_OTHER): Payer: Medicare Other | Admitting: Cardiovascular Disease

## 2012-05-06 ENCOUNTER — Encounter: Payer: Self-pay | Admitting: Cardiovascular Disease

## 2012-05-06 VITALS — BP 129/76 | HR 51 | Ht 67.0 in | Wt 144.0 lb

## 2012-05-06 DIAGNOSIS — I5032 Chronic diastolic (congestive) heart failure: Secondary | ICD-10-CM

## 2012-05-06 DIAGNOSIS — I2510 Atherosclerotic heart disease of native coronary artery without angina pectoris: Secondary | ICD-10-CM

## 2012-05-06 MED ORDER — CARVEDILOL 12.5 MG PO TABS: 12.5000 mg | ORAL_TABLET | Freq: Two times a day (BID) | ORAL | Status: DC

## 2012-05-06 NOTE — Progress Notes (Signed)
HPI:  71 year old woman presenting for followup evaluation. The patient has coronary artery disease and initially presented with an inferolateral MI in 2010. She was treated with a bare-metal stent in the left circumflex. She was hospitalized for congestive heart failure soon after her initial event, but has not required repeat hospitalization.  The patient has chronic dyspnea. There has been no major change in this symptom. She sleeps on 2 pillows, but does not really have orthopnea. She denies PND. She's had some mild swelling of her ankles and she takes furosemide. She denies any pretibial swelling. She has not had chest pain or pressure. 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 She presented with left arm numbness and clumsiness. Her symptoms have resolved and she has fully recovered.  Outpatient Encounter Prescriptions as of 05/06/2012  Medication Sig Dispense Refill  . carvedilol (COREG) 6.25 MG tablet Take 1 tablet (6.25 mg total) by mouth 2 (two) times daily with a meal.  60 tablet  11  . famotidine (PEPCID) 40 MG tablet Take 1 tablet (40 mg total) by mouth daily.  30 tablet  11  . furosemide (LASIX) 40 MG tablet 1 tab am and 1/2 pm      . losartan (COZAAR) 50 MG tablet Take 1 tablet (50 mg total) by mouth daily.  30 tablet  6  . nitroGLYCERIN (NITROSTAT) 0.4 MG SL tablet Place 1 tablet (0.4 mg total) under the tongue every 5 (five) minutes as needed.  25 tablet  2  . potassium chloride SA (K-DUR,KLOR-CON) 20 MEQ tablet Take 1 tablet (20 mEq total) by mouth daily.  30 tablet  11  . simvastatin (ZOCOR) 40 MG tablet Take 1 tablet (40 mg total) by mouth at bedtime.  30 tablet  9  . DISCONTD: furosemide (LASIX) 40 MG tablet Take 1 tablet (40 mg total) by mouth as directed. 1 tab am and 1/2 pm  45 tablet  10    No Known Allergies  Past Medical History    Diagnosis Date  . HTN (hypertension)   . Arthritis   . Macular degeneration   . Myocardial infarction     Acute myocardial infarction 2010 - treated with BMS of LCx. LVEF 50%, with subsequent CHF  . H/O: hysterectomy   . Lung cancer dx'd 07/2006    Lung CA, s/p resection, followed by Dr Shirline Frees    ROS: Negative except as per HPI  BP 129/76  Pulse 51  Ht 5\' 7"  (1.702 m)  Wt 65.318 kg (144 lb)  BMI 22.55 kg/m2  PHYSICAL EXAM: Pt is alert and oriented, NAD HEENT: normal Neck: JVP - normal, carotids 2+= without bruits Lungs: CTA bilaterally CV: RRR without murmur or gallop Abd: soft, NT, Positive BS, no hepatomegaly Ext: no C/C/E, distal pulses intact and equal Skin: warm/dry no rash  EKG:  Sinus bradycardia 51 beats per minute, nonspecific ST and T wave abnormality peer  2D Echo: Study Conclusions  - Left ventricle: The cavity size was mildly dilated. Wall thickness was increased in a pattern of mild LVH. Systolic function was moderately to severely reduced. The estimated ejection fraction was in the range of 30% to 35%. Diffuse hypokinesis. There is akinesis of the posterolateral myocardium. Doppler parameters are consistent with abnormal left ventricular relaxation (grade 1 diastolic dysfunction). - Aortic valve: Trivial regurgitation. - Mitral valve:  Calcified annulus. Mild regurgitation. - Left atrium: The atrium was mildly dilated. - Pulmonary arteries: Systolic pressure was mildly increased. PA peak pressure: 34mm Hg (S).  ASSESSMENT AND PLAN: 1. Coronary artery disease, native vessel. The patient is off of antiplatelet therapy after a hemorrhagic infarct. She has no ischemic symptoms and we will continue to follow her clinically.  2. Ischemic cardiomyopathy. I personally reviewed her echo images. She has normal function of the anterior wall and anteroseptum. She has akinesis of the inferior and posterior walls consistent with her prior infarct. Her left  ventricular ejection fraction was estimated at 30-35%. I have recommended that we increase her carvedilol to 12.5 mg twice daily. We will continue to more aggressively titrate her losartan and carvedilol towards goal doses as she tolerates. I will probably repeat an echocardiogram in about 6 months.  3. Hyperlipidemia. The patient is followed by her primary care physician.  Tonny Bollman 05/06/2012 11:21 AM

## 2012-05-06 NOTE — Patient Instructions (Addendum)
Your physician recommends that you schedule a follow-up appointment in: 3 MONTHS  Your physician has recommended you make the following change in your medication: INCREASE Carvedilol to 12.5mg  take one by mouth twice a day

## 2012-06-24 ENCOUNTER — Other Ambulatory Visit: Payer: Self-pay

## 2012-06-24 MED ORDER — POTASSIUM CHLORIDE CRYS ER 20 MEQ PO TBCR: 20.0000 meq | EXTENDED_RELEASE_TABLET | Freq: Every day | ORAL | Status: DC

## 2012-07-08 ENCOUNTER — Emergency Department (HOSPITAL_COMMUNITY): Payer: Medicare Other

## 2012-07-08 ENCOUNTER — Inpatient Hospital Stay (HOSPITAL_COMMUNITY)
Admission: EM | Admit: 2012-07-08 | Discharge: 2012-07-11 | DRG: 065 | Disposition: A | Discharge status: Home Health | Payer: Medicare Other | Attending: Internal Medicine | Admitting: Internal Medicine

## 2012-07-08 ENCOUNTER — Encounter (HOSPITAL_COMMUNITY): Payer: Self-pay | Admitting: Emergency Medicine

## 2012-07-08 DIAGNOSIS — I252 Old myocardial infarction: Secondary | ICD-10-CM

## 2012-07-08 DIAGNOSIS — R29810 Facial weakness: Secondary | ICD-10-CM | POA: Diagnosis present

## 2012-07-08 DIAGNOSIS — I10 Essential (primary) hypertension: Secondary | ICD-10-CM | POA: Diagnosis present

## 2012-07-08 DIAGNOSIS — M1990 Unspecified osteoarthritis, unspecified site: Secondary | ICD-10-CM | POA: Diagnosis present

## 2012-07-08 DIAGNOSIS — I2510 Atherosclerotic heart disease of native coronary artery without angina pectoris: Secondary | ICD-10-CM | POA: Diagnosis present

## 2012-07-08 DIAGNOSIS — I501 Left ventricular failure: Secondary | ICD-10-CM

## 2012-07-08 DIAGNOSIS — E785 Hyperlipidemia, unspecified: Secondary | ICD-10-CM | POA: Diagnosis present

## 2012-07-08 DIAGNOSIS — I639 Cerebral infarction, unspecified: Secondary | ICD-10-CM

## 2012-07-08 DIAGNOSIS — I6350 Cerebral infarction due to unspecified occlusion or stenosis of unspecified cerebral artery: Principal | ICD-10-CM | POA: Diagnosis present

## 2012-07-08 DIAGNOSIS — G8190 Hemiplegia, unspecified affecting unspecified side: Secondary | ICD-10-CM | POA: Diagnosis present

## 2012-07-08 DIAGNOSIS — E782 Mixed hyperlipidemia: Secondary | ICD-10-CM | POA: Diagnosis present

## 2012-07-08 DIAGNOSIS — I2589 Other forms of chronic ischemic heart disease: Secondary | ICD-10-CM | POA: Diagnosis present

## 2012-07-08 DIAGNOSIS — Z9861 Coronary angioplasty status: Secondary | ICD-10-CM

## 2012-07-08 DIAGNOSIS — R471 Dysarthria and anarthria: Secondary | ICD-10-CM

## 2012-07-08 DIAGNOSIS — Z87891 Personal history of nicotine dependence: Secondary | ICD-10-CM

## 2012-07-08 DIAGNOSIS — Z8673 Personal history of transient ischemic attack (TIA), and cerebral infarction without residual deficits: Secondary | ICD-10-CM | POA: Diagnosis present

## 2012-07-08 DIAGNOSIS — G459 Transient cerebral ischemic attack, unspecified: Secondary | ICD-10-CM

## 2012-07-08 DIAGNOSIS — G8194 Hemiplegia, unspecified affecting left nondominant side: Secondary | ICD-10-CM

## 2012-07-08 DIAGNOSIS — H3530 Unspecified macular degeneration: Secondary | ICD-10-CM | POA: Diagnosis present

## 2012-07-08 DIAGNOSIS — R079 Chest pain, unspecified: Secondary | ICD-10-CM

## 2012-07-08 DIAGNOSIS — C3490 Malignant neoplasm of unspecified part of unspecified bronchus or lung: Secondary | ICD-10-CM

## 2012-07-08 DIAGNOSIS — I509 Heart failure, unspecified: Secondary | ICD-10-CM | POA: Diagnosis present

## 2012-07-08 DIAGNOSIS — Z85118 Personal history of other malignant neoplasm of bronchus and lung: Secondary | ICD-10-CM

## 2012-07-08 DIAGNOSIS — K219 Gastro-esophageal reflux disease without esophagitis: Secondary | ICD-10-CM

## 2012-07-08 LAB — CBC
HCT: 38.6 % (ref 36.0–46.0)
Hemoglobin: 13.1 g/dL (ref 12.0–15.0)
MCH: 31.2 pg (ref 26.0–34.0)
MCHC: 33.9 g/dL (ref 30.0–36.0)
RBC: 4.2 MIL/uL (ref 3.87–5.11)

## 2012-07-08 LAB — COMPREHENSIVE METABOLIC PANEL
Alkaline Phosphatase: 109 U/L (ref 39–117)
BUN: 17 mg/dL (ref 6–23)
Chloride: 99 mEq/L (ref 96–112)
Creatinine, Ser: 0.95 mg/dL (ref 0.50–1.10)
GFR calc Af Amer: 68 mL/min — ABNORMAL LOW (ref >90)
Glucose, Bld: 114 mg/dL — ABNORMAL HIGH (ref 70–99)
Potassium: 4.2 mEq/L (ref 3.5–5.1)
Total Bilirubin: 0.3 mg/dL (ref 0.3–1.2)
Total Protein: 6.7 g/dL (ref 6.0–8.3)

## 2012-07-08 LAB — DIFFERENTIAL
Eosinophils Absolute: 0.7 10*3/uL (ref 0.0–0.7)
Lymphs Abs: 3 10*3/uL (ref 0.7–4.0)
Monocytes Absolute: 0.7 10*3/uL (ref 0.1–1.0)
Monocytes Relative: 9 % (ref 3–12)
Neutrophils Relative %: 42 % — ABNORMAL LOW (ref 43–77)

## 2012-07-08 LAB — TROPONIN I: Troponin I: <0.3 ng/mL (ref <0.30)

## 2012-07-08 LAB — PROTIME-INR: INR: 1 (ref 0.00–1.49)

## 2012-07-08 LAB — POCT I-STAT TROPONIN I: Troponin i, poc: 0.01 ng/mL (ref 0.00–0.08)

## 2012-07-08 MED ORDER — ASPIRIN 325 MG PO TABS
325.0000 mg | ORAL_TABLET | Freq: Every day | ORAL | Status: DC
  Administered 2012-07-09 – 2012-07-11 (×3): 325 mg via ORAL
  Filled 2012-07-08 (×4): qty 1

## 2012-07-08 MED ORDER — SODIUM CHLORIDE 0.9 % IV SOLN
INTRAVENOUS | Status: AC
  Administered 2012-07-08: 1000 mL via INTRAVENOUS

## 2012-07-08 MED ORDER — ENOXAPARIN SODIUM 40 MG/0.4ML ~~LOC~~ SOLN
40.0000 mg | SUBCUTANEOUS | Status: DC
  Administered 2012-07-08 – 2012-07-10 (×3): 40 mg via SUBCUTANEOUS
  Filled 2012-07-08 (×4): qty 0.4

## 2012-07-08 NOTE — ED Notes (Signed)
Pt arrived by EMS. Code Stroke. Left sided weakness, drooling. Last seen normal at 1600 driving. Pt family arrived at 70 pt had unstable gait, unable to get her self up from "slumping" position. CBG by EMS 107 BP 140/69 HR66

## 2012-07-08 NOTE — H&P (Signed)
[facility] History and Physical  [name] ZOX:[id number] DOB: 1940-08-08 DOA: 07/08/2012  Referring physician: EDP PCP: Ralene Ok, MD  Specialists:   Chief Complaint:  Left Arm Weakness and Numbness and Facial Droop and Slurred Speech  HPI: [name] is a 71 y.o. 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  EMS was called and she was brought to the ED  As a Code Stroke, and was seen by Neurology in the ED and was not deemed to be a TPA candidate.  The preliminary CT scan of the head was negative for acute findings, and her symptoms began to improve over time.     Review of Systems: The patient denies anorexia, fever, weight loss, vision loss, decreased hearing, hoarseness, chest pain, syncope, dyspnea on exertion, peripheral edema, balance deficits, hemoptysis, abdominal pain, melena, hematochezia, severe indigestion/heartburn, hematuria, incontinence, genital sores, muscle weakness, suspicious skin lesions, transient blindness, difficulty walking, depression, unusual weight change, abnormal bleeding, enlarged lymph nodes, angioedema, and breast masses.    Past Medical History  Diagnosis Date  . HTN (hypertension)   . Arthritis   . Macular degeneration   . Myocardial infarction     Acute myocardial infarction 2010 - treated with BMS of LCx. LVEF 50%, with subsequent CHF  . H/O: hysterectomy   . Lung cancer dx'd 07/2006    Lung CA, s/p resection, followed by Dr Shirline Frees  . Stroke     Past Surgery History:         Lung Resection for Lung Cancer        Total ABD Hysterectomy       PTCA with Stent X 2        Medications:  HOME MEDS: Prior to Admission medications  Medication Sig Start Date End Date Taking? Authorizing Provider calcium  carbonate (OS-CAL - DOSED IN MG OF ELEMENTAL CALCIUM) 1250 MG tablet Take 1 tablet by mouth daily.   Yes Historical Provider, MD carvedilol (COREG) 12.5 MG tablet Take 1 tablet (12.5 mg total) by mouth 2 (two) times daily with a meal. 05/06/12  Yes Tonny Bollman, MD famotidine (PEPCID) 40 MG tablet Take 1 tablet (40 mg total) by mouth daily. 12/24/11  Yes Tonny Bollman, MD furosemide (LASIX) 40 MG tablet 1 tab am and 1/2 pm 01/18/12  Yes Tonny Bollman, MD losartan (COZAAR) 50 MG tablet Take 1 tablet (50 mg total) by mouth daily. 12/24/11  Yes Tonny Bollman, MD nitroGLYCERIN (NITROSTAT) 0.4 MG SL tablet Place 1 tablet (0.4 mg total) under the tongue every 5 (five) minutes as needed. 11/20/10  Yes Tonny Bollman, MD potassium chloride SA (K-DUR,KLOR-CON) 20 MEQ tablet Take 1 tablet (20 mEq total) by mouth daily. 06/24/12  Yes Tonny Bollman, MD simvastatin (ZOCOR) 40 MG tablet Take 1 tablet (40 mg total) by mouth at bedtime. 02/08/12  Yes Tonny Bollman, MD   Allergies:  No Known Allergies  Social History:   reports that she has never smoked. She does not have any smokeless tobacco history on file. She reports that she does not drink alcohol. Her drug history not on file.  Family History: Problem  . +Coronary artery disease   . +Hypertension   . +Breast cancer     Physical Exam:  GEN:  Pleasant Well nourished and well developed Elderly Caucasian Female examined  and in no acute distress; cooperative with exam Filed Vitals:   07/08/12 1920 07/08/12 2012  BP: 110/80   Pulse: 63   Temp: 97.3 F (36.3 C) 97.9 F (36.6 C)  TempSrc: Oral   Resp: 27   SpO2: 97%    Blood pressure 110/80, pulse 63, temperature 97.9 F (36.6 C), temperature source Oral, resp. rate 27, SpO2 97.00%. PSYCH: She is alert and oriented x4; does not appear anxious does not appear depressed; affect is normal HEENT: Normocephalic and Atraumatic, Mucous membranes pink; PERRLA; EOM intact; Fundi:  Benign;  No scleral  icterus, Nares: Patent, Oropharynx: Clear, Fair Dentition, Neck:  FROM, no cervical lymphadenopathy nor thyromegaly or carotid bruit; no JVD; Breasts:: Not examined CHEST WALL: No tenderness CHEST: Normal respiration, clear to auscultation bilaterally HEART: Regular rate and rhythm; no murmurs rubs or gallops BACK: No kyphosis or scoliosis; no CVA tenderness ABDOMEN: Positive Bowel Sounds,  soft non-tender; no masses, no organomegaly, no pannus; no intertriginous candida. Rectal Exam: Not done EXTREMITIES: No bone or joint deformity; age-appropriate arthropathy of the hands and knees; no cyanosis, clubbing or edema; no ulcerations. Genitalia: not examined PULSES: 2+ and symmetric SKIN: Normal hydration no rash or ulceration CNS: Cranial nerves 2-12 grossly intact,   Mild dysarthria remains but improved per SOn who is at the Bedside,  +LUE Weakness 4/5 grip, biceps and triceps and shoulder.  Otherwise no other focal neurologic deficits    Labs on Admission:  Basic Metabolic Panel:  Lab 07/08/12 5621  NA 133*  K 4.2  CL 99  CO2 25  GLUCOSE 114*  BUN 17  CREATININE 0.95  CALCIUM 9.1  MG --  PHOS --   Liver Function Tests:  Lab 07/08/12 1902  AST 23  ALT 18  ALKPHOS 109  BILITOT 0.3  PROT 6.7  ALBUMIN 3.5   No results found for this basename: LIPASE:5,AMYLASE:5 in the last 168 hours No results found for this basename: AMMONIA:5 in the last 168 hours CBC:  Lab 07/08/12 1902  WBC 7.6  NEUTROABS 3.2  HGB 13.1  HCT 38.6  MCV 91.9  PLT 216   Cardiac Enzymes:  Lab 07/08/12 1902  CKTOTAL --  CKMB --  CKMBINDEX --  TROPONINI <0.30    BNP (last 3 results) No results found for this basename: PROBNP:3 in the last 8760 hours CBG: No results found for this basename: GLUCAP:5 in the last 168 hours  Radiological Exams on Admission: Ct Head Wo Contrast  07/08/2012  *RADIOLOGY REPORT*  Clinical Data: Code stroke, left side weakness, unsteady gait, headache, drooling   CT HEAD WITHOUT CONTRAST  Technique:  Contiguous axial images were obtained from the base of the skull through the vertex without contrast.  Comparison: None Correlation:  MRI brain 02/18/2012  Findings: Generalized atrophy. Normal ventricular morphology. No midline shift or mass effect. Small vessel chronic ischemic changes of deep cerebral white matter. Large old lacunar infarct left basal ganglia. Old right parietal cortical infarct. No intracranial hemorrhage, mass lesion or evidence of acute infarction. No extra-axial fluid collections. Chronic opacification of right maxillary sinus. No acute osseous findings.  IMPRESSION: Atrophy with small vessel chronic ischemic changes of deep cerebral white matter. 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 No acute intracranial abnormalities. Chronic right maxillary sinus disease.  Critical Value/emergent results were called by telephone at the time of interpretation on 07/08/2012 at 1912 hours to Dr. Lynelle Doctor,  who verbally acknowledged these results.   Original Report Authenticated By: Ulyses Southward, M.D.      Assessment: Principal Problem:  *TIA (transient ischemic attack) Active Problems:  HYPERTENSION  CAD, NATIVE VESSEL  HYPERLIPIDEMIA-MIXED    PLAN: Admit to Observation Telemetry Bed for TIA Workup Neuro Saw in ED Neuro Checks, MRI/MRA in AM.   Reconcile Home Medications ASA therapy  DVT Prophylaxis    Code Status:  FULL CODE Family Communication:  Son at Bedside Disposition Plan:  TBA  Time spent: 48 Minutes  Ron Parker [facility] Pager [phone]  If 7PM-7AM, please contact night-coverage [email or web address] Password [facility] 07/08/2012, 9:28 PM

## 2012-07-08 NOTE — ED Provider Notes (Signed)
History     CSN: [id number]  Arrival date & time 07/08/12  5784   First MD Initiated Contact with Patient 07/08/12 1856      Chief Complaint  Patient presents with  . Code Stroke    (Consider location/radiation/quality/duration/timing/severity/associated sxs/prior treatment) HPI Level 5 caveat due to need for intervention Pt brought by EMS as a Code Stroke after sudden onset of R sided facial droop, drooling, ?R arm weakness. She was unsteady on her feet at home. Denies any pain.   Past Medical History  Diagnosis Date  . HTN (hypertension)   . Arthritis   . Macular degeneration   . Myocardial infarction     Acute myocardial infarction 2010 - treated with BMS of LCx. LVEF 50%, with subsequent CHF  . H/O: hysterectomy   . Lung cancer dx'd 07/2006    Lung CA, s/p resection, followed by Dr Shirline Frees  . Stroke     History reviewed. No pertinent past surgical history.  Family History  Problem Relation Age of Onset  . Coronary artery disease    . Hypertension    . Breast cancer      History  Substance Use Topics  . Smoking status: Never Smoker   . Smokeless tobacco: Not on file  . Alcohol Use: No    OB History    Grav Para Term Preterm Abortions TAB SAB Ect Mult Living                  Review of Systems Unable to assess due to mental status.    Allergies  Review of patient's allergies indicates no known allergies.  Home Medications   Current Outpatient Rx  Name  Route  Sig  Dispense  Refill  . CALCIUM CARBONATE 1250 MG PO TABS   Oral   Take 1 tablet by mouth daily.         Marland Kitchen CARVEDILOL 12.5 MG PO TABS   Oral   Take 1 tablet (12.5 mg total) by mouth 2 (two) times daily with a meal.   60 tablet   11   . FAMOTIDINE 40 MG PO TABS   Oral   Take 1 tablet (40 mg total) by mouth daily.   30 tablet   11   . FUROSEMIDE 40 MG PO TABS      1 tab am and 1/2 pm         . LOSARTAN POTASSIUM 50 MG PO TABS   Oral   Take 1 tablet (50 mg total) by mouth  daily.   30 tablet   6   . NITROGLYCERIN 0.4 MG SL SUBL   Sublingual   Place 1 tablet (0.4 mg total) under the tongue every 5 (five) minutes as needed.   25 tablet   2   . POTASSIUM CHLORIDE CRYS ER 20 MEQ PO TBCR   Oral   Take 1 tablet (20 mEq total) by mouth daily.   30 tablet   11   . SIMVASTATIN 40 MG PO TABS   Oral   Take 1 tablet (40 mg total) by mouth at bedtime.   30 tablet   9     BP 110/80  Pulse 63  Temp 97.3 F (36.3 C) (Oral)  Resp 27  SpO2 97%  Physical Exam  Nursing note and vitals reviewed. Constitutional: She is oriented to person, place, and time. She appears well-developed and well-nourished.  HENT:  Head: Normocephalic and atraumatic.  Eyes: EOM are normal. Pupils are equal,  round, and reactive to light.  Neck: Normal range of motion. Neck supple.  Cardiovascular: Normal rate, normal heart sounds and intact distal pulses.   Pulmonary/Chest: Effort normal and breath sounds normal.  Abdominal: Bowel sounds are normal. She exhibits no distension. There is no tenderness.  Musculoskeletal: Normal range of motion. She exhibits no edema and no tenderness.  Neurological: She is alert and oriented to person, place, and time. She has normal strength. A cranial nerve deficit (R sided facial droop) and sensory deficit (subjective R hand numbness) is present.       For full NIHSS, please see the Code Stroke team documentation.   Skin: Skin is warm and dry. No rash noted.  Psychiatric: She has a normal mood and affect.    ED Course  Procedures (including critical care time)  Labs Reviewed  DIFFERENTIAL - Abnormal; Notable for the following:    Neutrophils Relative 42 (*)     Eosinophils Relative 9 (*)     All other components within normal limits  COMPREHENSIVE METABOLIC PANEL - Abnormal; Notable for the following:    Sodium 133 (*)     Glucose, Bld 114 (*)     GFR calc non Af Amer 59 (*)     GFR calc Af Amer 68 (*)     All other components within  normal limits  PROTIME-INR  APTT  CBC  TROPONIN I  POCT I-STAT TROPONIN I   Ct Head Wo Contrast  07/08/2012  *RADIOLOGY REPORT*  Clinical Data: Code stroke, left side weakness, unsteady gait, headache, drooling  CT HEAD WITHOUT CONTRAST  Technique:  Contiguous axial images were obtained from the base of the skull through the vertex without contrast.  Comparison: None Correlation:  MRI brain 02/18/2012  Findings: Generalized atrophy. Normal ventricular morphology. No midline shift or mass effect. Small vessel chronic ischemic changes of deep cerebral white matter. Large old lacunar infarct left basal ganglia. Old right parietal cortical infarct. No intracranial hemorrhage, mass lesion or evidence of acute infarction. No extra-axial fluid collections. Chronic opacification of right maxillary sinus. No acute osseous findings.  IMPRESSION: Atrophy with small vessel chronic ischemic changes of deep cerebral white matter. 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 No acute intracranial abnormalities. Chronic right maxillary sinus disease.  Critical Value/emergent results were called by telephone at the time of interpretation on 07/08/2012 at 1912 hours to Dr. Lynelle Doctor, who verbally acknowledged these results.   Original Report Authenticated By: Ulyses Southward, M.D.      No diagnosis found.    MDM   Date: 07/08/2012  Rate: 76  Rhythm: normal sinus rhythm and premature ventricular contractions (PVC)  QRS Axis: normal  Intervals: normal  ST/T Wave abnormalities: nonspecific ST/T changes  Conduction Disutrbances:nonspecific intraventricular conduction delay  Narrative Interpretation:   Old EKG Reviewed: unchanged    Pt with stroke symptoms but not a candidate for tPA per Neurologist. Will admit for further eval.      Leonette Most B. Bernette Mayers, MD 07/08/12 2122

## 2012-07-08 NOTE — ED Notes (Signed)
Family at bedside   Son at bedside

## 2012-07-08 NOTE — ED Notes (Signed)
Code stroke encoded-1833 Code stroke called-1833 Pt arrival-1852 EDP exam-1852 Stroke team [phone] Last seen normal-1600 Pt arrival in CT-1855 Phlebotomist [phone] CT read by [id number]

## 2012-07-08 NOTE — Consult Note (Signed)
Subjective: [name] is a 71 y.o. right handed female on whom I have been asked to consult for evaluation and treatment of a possible stroke. Onset of symptoms was gradual, beginning today.  Pt lives at home alone and time of onset of symptoms is not clear. At first there was suspected onset of symptoms at 4 pm and then at 6 pm. Pt states she fell off the chair and then started feeling numbness in her L hemibody along with drooling from L side of her face. At around 6pm her brother visited her and took her to emergency department.  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   On admission NIHSS is 3. Due to low NIHSS and no certain time of symptoms of onset she is not a TPA candidate. She is not on anti platelet agents or anticoagulation.   Outside reports reviewed: none.  Patient Active Problem List   Diagnosis Date Noted  . Malignant neoplasm of bronchus and lung, unspecified site 04/21/2012  . CONGESTIVE HEART FAILURE, LEFT 06/17/2009  . HYPERLIPIDEMIA-MIXED 04/24/2009  . CAD, NATIVE VESSEL 04/24/2009  . GERD 04/24/2009  . CHEST PAIN UNSPECIFIED 04/24/2009  . MACULAR DEGENERATION 04/23/2009  . HYPERTENSION 04/23/2009  . OSTEOARTHRITIS 04/23/2009   Past Medical History  Diagnosis Date  . HTN (hypertension)   . Arthritis   . Macular degeneration   . Myocardial infarction     Acute myocardial infarction 2010 - treated with BMS of LCx. LVEF 50%, with subsequent CHF  . H/O: hysterectomy   . Lung cancer dx'd 07/2006    Lung CA, s/p resection, followed by Dr Shirline Frees  . Stroke    History reviewed. No pertinent past surgical history. Family History  Problem Relation Age of Onset  . Coronary artery  disease    . Hypertension    . Breast cancer     Scheduled Meds:   Continuous Infusions:   PRN Meds:    No Known Allergies History   Social History  . Marital Status: Widowed    Spouse Name: N/A    Number of Children: N/A  . Years of Education: N/A   Occupational History  . Not on file.   Social History Main Topics  . Smoking status: Never Smoker   . Smokeless tobacco: Not on file  . Alcohol Use: No  . Drug Use: Not on file  . Sexually Active: Not on file   Other Topics Concern  . Not on file   Social History Narrative   The patient is a widow and has one son.  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  She does not drink alcohol.    Review of Systems Constitutional: negative Eyes: negative Ears, nose, mouth, throat, and face: negative Respiratory: negative Cardiovascular: positive for hs of MI  Social: Lives alone, not current smoker, past hx of smoking 35 pack yrs, no ETOH or drug use. Family hx: non contributory.   Objective: Vital signs in last 24 hours: Temp:  [97.3 F (36.3 C)] 97.3 F (36.3 C) (12/13 1920) Pulse Rate:  [63] 63  (12/13 1920) Resp:  [27]  27  (12/13 1920) BP: (110)/(80) 110/80 mmHg (12/13 1920) SpO2:  [97 %] 97 % (12/13 1920)  Neuro exam: Pt is awake and oriented to time and place CNs: EOM intact, Pupils 3-->2 symmetrical Facial droop present on L side  Tongue midline Motor: 4+/5 b/l upper and lower extremity with decreased finger grip on L side Sensory: complete sensory neglect L side Coordination intact Reflexes 2+ symmetrical Speech as per son appears to be baseline.  NIHSS 3 for L facial and sensory on L side.   Imaging CT Head: obtained and reviewed: Old left basal ganglia and right parietal cortical infarcts.  No acute intracranial abnormalities.   Lab Review Lab Results  Component Value Date   WBC 7.6 07/08/2012   WBC 6.4 04/18/2012   RBC 4.20 07/08/2012   RBC 4.18 04/18/2012    HGB 13.1 07/08/2012   HGB 13.6 04/18/2012   HCT 38.6 07/08/2012   HCT 39.8 04/18/2012   PLT 216 07/08/2012   PLT 197 04/18/2012   Lab Results  Component Value Date   NA 133* 07/08/2012   NA 138 04/18/2012   NA 134 04/06/2011   K 4.2 07/08/2012   K 4.5 04/18/2012   K 4.5 04/06/2011   CREATININE 0.95 07/08/2012   CREATININE 1.0 04/18/2012   CREATININE 0.8 04/06/2011   BUN 17 07/08/2012   BUN 24.0 04/18/2012   BUN 20 04/06/2011   Lab Results  Component Value Date   APTT 34 07/08/2012   Lab Results  Component Value Date   INR 1.00 07/08/2012    Assessment/Plan: 71 y/o F with hx of HTN, lung Ca, hx stroke about 1 yr ago R parieto occipital region being evaluate with L sided Facial droop and sensory abnormality on L side with NIHSS of 3. Not TPA candidate as not aware of time of onset and NIHSS only 3.   - MRI brain, MRA H/N -2dEcho -Start ASA 325 - No antihypertensive medications acutely unless BP over 220/110 - CTH if acute change of mental status - Lipid panel, HbA1c -TSH, B12, folate, RPR -speech eval -PT/OT.

## 2012-07-08 NOTE — ED Notes (Signed)
Pt did not pass swallow screen. Pt kept NPO. EDP notified.

## 2012-07-09 ENCOUNTER — Inpatient Hospital Stay (HOSPITAL_COMMUNITY): Payer: Medicare Other

## 2012-07-09 DIAGNOSIS — G8194 Hemiplegia, unspecified affecting left nondominant side: Secondary | ICD-10-CM

## 2012-07-09 DIAGNOSIS — R471 Dysarthria and anarthria: Secondary | ICD-10-CM

## 2012-07-09 DIAGNOSIS — I059 Rheumatic mitral valve disease, unspecified: Secondary | ICD-10-CM

## 2012-07-09 DIAGNOSIS — G8190 Hemiplegia, unspecified affecting unspecified side: Secondary | ICD-10-CM

## 2012-07-09 LAB — GLUCOSE, CAPILLARY: Glucose-Capillary: 127 mg/dL — ABNORMAL HIGH (ref 70–99)

## 2012-07-09 LAB — RAPID URINE DRUG SCREEN, HOSP PERFORMED
Amphetamines: NOT DETECTED
Barbiturates: NOT DETECTED
Opiates: NOT DETECTED
Tetrahydrocannabinol: NOT DETECTED

## 2012-07-09 LAB — LIPID PANEL
Cholesterol: 132 mg/dL (ref 0–200)
HDL: 66 mg/dL (ref >39)
Total CHOL/HDL Ratio: 2 RATIO
Triglycerides: 86 mg/dL (ref <150)

## 2012-07-09 LAB — HEMOGLOBIN A1C
Hgb A1c MFr Bld: 6 % — ABNORMAL HIGH (ref <5.7)
Mean Plasma Glucose: 126 mg/dL — ABNORMAL HIGH (ref <117)

## 2012-07-09 MED ORDER — SIMVASTATIN 40 MG PO TABS
40.0000 mg | ORAL_TABLET | Freq: Every day | ORAL | Status: DC
  Administered 2012-07-09 – 2012-07-10 (×2): 40 mg via ORAL
  Filled 2012-07-09 (×3): qty 1

## 2012-07-09 MED ORDER — CARVEDILOL 3.125 MG PO TABS
3.1250 mg | ORAL_TABLET | Freq: Two times a day (BID) | ORAL | Status: DC
  Filled 2012-07-09 (×2): qty 1

## 2012-07-09 NOTE — Evaluation (Signed)
Clinical/Bedside Swallow Evaluation Patient Details  Name: [name] MRN: [id number] Date of Birth: 03-02-1941  Today's Date: 07/09/2012 Time: 9629-5284 SLP Time Calculation (min): 43 min  Past Medical History:  Past Medical History  Diagnosis Date  . HTN (hypertension)   . Arthritis   . Macular degeneration   . Myocardial infarction     Acute myocardial infarction 2010 - treated with BMS of LCx. LVEF 50%, with subsequent CHF  . H/O: hysterectomy   . Lung cancer dx'd 07/2006    Lung CA, s/p resection, followed by Dr Shirline Frees  . Stroke    Past Surgical History: History reviewed. No pertinent past surgical history. HPI:  [name] is a 71 y.o. 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  EMS was called and she was brought to the ED  As a Code Stroke, and was seen by Neurology in the ED and was not deemed to be a TPA candidate.  The preliminary CT scan of the head was negative for acute findings, and her symptoms began to improve over time.  MRI pending.  Prior MBS completed on 05/06/2009 with results not available.  Patient referred for BSE per stroke protocol.    Assessment / Plan / Recommendation Clinical Impression  Oropharyngeal swallow functional for regular consistency and thin liquids. No outward s/s of aspiration noted throughout evaluation.  ST to follow for diet tolerance in acute care setting due to history of stroke.    Aspiration Risk  Mild    Diet Recommendation Regular;Thin liquid   Liquid Administration via: Cup;Straw Medication Administration: Whole meds with liquid Supervision: Patient able to self feed;Intermittent supervision to cue for compensatory strategies Compensations: Slow rate;Small sips/bites Postural Changes and/or Swallow  Maneuvers: Seated upright 90 degrees;Upright 30-60 min after meal    Other  Recommendations Oral Care Recommendations: Oral care BID Other Recommendations: Clarify dietary restrictions   Follow Up Recommendations  Other (comment) (TBD)    Frequency and Duration min 2x/week  2 weeks       SLP Swallow Goals Patient will consume recommended diet without observed clinical signs of aspiration with: Modified independent assistance Patient will utilize recommended strategies during swallow to increase swallowing safety with: Modified independent assistance   Swallow Study Prior Functional Status   Lives at home alone     General Date of Onset: 07/08/12 HPI: Jodi  [name] is a 71 y.o. 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  EMS was called and she was brought to the ED  As a Code Stroke, and was seen by Neurology in the ED and was not deemed to be a TPA candidate.  The preliminary CT scan of the head was negative for acute findings, and her symptoms began to improve over time.   Previous Swallow Assessment: MBS 05/06/12 Diet Prior to this Study: NPO Temperature Spikes Noted: No Respiratory Status: Room air History of Recent Intubation: No Behavior/Cognition: Alert;Cooperative;Pleasant mood;Confused Oral Cavity - Dentition: Dentures, top;Adequate natural  dentition Self-Feeding Abilities: Able to feed self Patient Positioning: Upright in bed Baseline Vocal Quality: Clear Volitional Cough: Strong Volitional Swallow: Able to elicit    Oral/Motor/Sensory Function Overall Oral Motor/Sensory Function: Appears within functional limits for tasks assessed   Ice Chips Ice chips: Not tested   Thin Liquid Thin Liquid: Within functional limits Presentation: Cup;Spoon;Straw     Nectar Thick Nectar Thick Liquid: Not tested   Honey Thick Honey Thick Liquid: Not tested   Puree Puree: Within functional limits   Solid   GO Functional Assessment Tool Used: Clinical judgement Functional Limitations: Swallowing Swallow Current Status (O1308): At least 1 percent but less than 20 percent impaired, limited or restricted Swallow Goal Status [phone]): At least 1 percent but less than 20 percent impaired, limited or restricted Swallow Discharge Status [phone]): At least 1 percent but less than 20 percent impaired, limited or restricted  Solid: Within functional limits Presentation: Self Jodi Harp MS, CCC-SLP [phone] [facility] 07/09/2012,11:18 AM

## 2012-07-09 NOTE — Progress Notes (Signed)
MRI brain showing R MCA infarct. No significant intracranial stenosis on MRA. Echo within normal limitis. Awaiting Carotid doppler.  Continue ASA 325. Pt was taken off it in past as she has hx of hemorrhagic stroke.

## 2012-07-09 NOTE — Progress Notes (Signed)
*  PRELIMINARY RESULTS* Echocardiogram 2D Echocardiogram has been performed.  [name] 07/09/2012, 11:45 AM

## 2012-07-09 NOTE — Progress Notes (Signed)
Stroke Team Progress Note  HISTORY [name] is a 71 y.o. right handed female on whom we were asked to consult for evaluation and treatment of a possible stroke. Onset of symptoms was gradual, beginning 07/08/12. Pt lives at home alone and time of onset of symptoms was not clear. At first there was suspected onset of symptoms at 4 pm and then at 6 pm. Pt states she fell off the chair and then started feeling numbness in her L hemibody along with drooling from L side of her face. At around 6pm her brother visited her and took her to emergency department. 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 On admission NIHSS was 3. Due to low NIHSS and no certain time of symptoms of onset she was not a TPA candidate. She was not on anti platelet agents or anticoagulation PTA.   SUBJECTIVE   Overall she feels her condition is significantly resolved except for very mild left sided weakness. Somewhat more than with her previous CVA. Anxious for discharge. Mild headache.  OBJECTIVE Most recent Vital Signs: Filed Vitals:   07/09/12 0600 07/09/12 1108 07/09/12 1113 07/09/12 1115  BP: 105/59 139/42 131/84 131/68  Pulse: 60 73 71 62  Temp: 97.6 F (36.4 C) 97.4 F (36.3 C)    TempSrc: Oral Oral    Resp: 18 18 18 18   Height:      Weight:      SpO2: 100% 100% 98% 97%   CBG (last 3)   Basename 07/09/12 0326 07/08/12 2315  GLUCAP 111* 127*    IV Fluid Intake:     MEDICATIONS    . aspirin  325 mg Oral Daily  . carvedilol  3.125 mg Oral BID WC  . enoxaparin (LOVENOX) injection  40 mg Subcutaneous Q24H  . simvastatin  40 mg Oral q1800   PRN:    Diet:  General thin liquids Activity:  Bathroom privileges with  assistance DVT Prophylaxis:  Lovenox  CLINICALLY SIGNIFICANT STUDIES Basic Metabolic Panel:  Lab 07/08/12 1610  NA 133*  K 4.2  CL 99  CO2 25  GLUCOSE 114*  BUN 17  CREATININE 0.95  CALCIUM 9.1  MG --  PHOS --   Liver Function Tests:  Lab 07/08/12 1902  AST 23  ALT 18  ALKPHOS 109  BILITOT 0.3  PROT 6.7  ALBUMIN 3.5   CBC:  Lab 07/08/12 1902  WBC 7.6  NEUTROABS 3.2  HGB 13.1  HCT 38.6  MCV 91.9  PLT 216   Coagulation:  Lab 07/08/12 1902  LABPROT 13.1  INR 1.00   Cardiac Enzymes:  Lab 07/08/12 1902  CKTOTAL --  CKMB --  CKMBINDEX --  TROPONINI <0.30   Urinalysis: No results found for this basename: COLORURINE:2,APPERANCEUR:2,LABSPEC:2,PHURINE:2,GLUCOSEU:2,HGBUR:2,BILIRUBINUR:2,KETONESUR:2,PROTEINUR:2,UROBILINOGEN:2,NITRITE:2,LEUKOCYTESUR:2 in the last 168 hours Lipid Panel    Component Value Date/Time   CHOL 132 07/09/2012 0608   TRIG 86 07/09/2012 0608   HDL 66 07/09/2012 0608   CHOLHDL 2.0 07/09/2012 0608   VLDL 17 07/09/2012 0608   LDLCALC 49 07/09/2012 0608   HgbA1C  Lab Results  Component Value Date   HGBA1C 6.0* 07/08/2012    Urine Drug Screen:  Component Value Date/Time   LABOPIA NONE DETECTED 07/09/2012 0614   COCAINSCRNUR NONE DETECTED 07/09/2012 0614   LABBENZ NONE DETECTED 07/09/2012 0614   AMPHETMU NONE DETECTED 07/09/2012 0614   THCU NONE DETECTED 07/09/2012 0614   LABBARB NONE DETECTED 07/09/2012 8295    Alcohol Level: No results found for this basename: ETH:2 in the last 168 hours  Ct Head Wo Contrast 07/08/2012 IMPRESSION: Atrophy with small vessel chronic ischemic changes of deep cerebral white matter. 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 No acute intracranial abnormalities. Chronic right maxillary sinus disease.Marland Kitchen    MRI of the brain  Pending  MRA of the brain  Pending  2D Echocardiogram  - Pending  Carotid Doppler  - Pending  EKG  telem - NSR  Therapy Recommendations -  pending  Physical Exam  This is a pleasant 71 year old female in bed much more alert and in no acute distress. Speech has improved from last night. Motor strength-5 over 5 in both lower extremities. 4/5 in the left upper extremity. Slight drift. 5 over 5 right upper extremity. Sensation-slightly decreased to light touch on the left both upper and lower extremities. Cerebellar testing-finger to nose testing intact.     ASSESSMENT [name] is a 71 y.o. female presenting with mild left-sided weakness and left facial droop with mildly decreased sensation on the left. Her NIHSS was rated at a 3 on arrival. Imaging confirms an old left basal ganglia and right parietal cortical infarct,  There was no acute abnormality. Infarct felt to be thromboembolic,  work up underway. On No anticoagulants prior to admission. The patient stated that she had had a hemorrhagic CVA in approximately July of this year and had been taken off aspirin at that time. Now back on aspirin 325 mg orally every day for secondary stroke prevention.  Patient with resultant mild left hemiparesis. She also has mild left hemisensory loss.   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  Await MRI/MRA, carotid Dopplers, 2-D echo  Risk factor modification  Await therapy evaluations.  Delton See PA-C [facility] Pager [phone] 07/09/2012, 12:39 PM

## 2012-07-09 NOTE — Progress Notes (Signed)
VASCULAR LAB PRELIMINARY  PRELIMINARY  PRELIMINARY  PRELIMINARY  Carotid Dopplers completed.    Preliminary report:  There is no ICA stenosis.  Vertebral artery flow is antegrade.  Tennelle Taflinger, RVT 07/09/2012, 2:45 PM

## 2012-07-09 NOTE — Progress Notes (Signed)
[facility] PROGRESS NOTE  [name] ZOX:[id number] DOB: [birth year] DOA: 07/08/2012 PCP: Ralene Ok, MD  Assessment/Plan: Left hemiparesis/dysarthria -Dysarthria has completely resolved -Patient states that left upper extremity hemiparesis had not changed or worsened from previous weakness -Await MRI/MRA of brain -Carotid ultrasound -Check TSH -Check magnesium -appreciate neurology eval Coronary artery disease/ischemic cardiomyopathy -Restart carvedilol at lower dose, 3.125 mg mg twice a day -Ejection fraction 30-35% -Well compensated at this time Hyperlipidemia -Continue Zocor -LDL 49 Hypertension -Systolic blood pressure was marginal this morning, 105/59 -Orthostatic vital signs -Restart low-dose carvedilol -Hold losartan for now History of non-small cell lung cancer -Status post right lower lobe segmentectomy 2007      Disposition Plan:   Home when medically stable     Procedures/Studies: Ct Head Wo Contrast  07/08/2012  *RADIOLOGY REPORT*  Clinical Data: Code stroke, left side weakness, unsteady gait, headache, drooling  CT HEAD WITHOUT CONTRAST  Technique:  Contiguous axial images were obtained from the base of the skull through the vertex without contrast.  Comparison: None Correlation:  MRI brain 02/18/2012  Findings: Generalized atrophy. Normal ventricular morphology. No midline shift or mass effect. Small vessel chronic ischemic changes of deep cerebral white matter. Large old lacunar infarct left basal ganglia. Old right parietal cortical infarct. No intracranial hemorrhage, mass lesion or evidence of acute infarction. No extra-axial fluid collections. Chronic opacification of right maxillary sinus. No acute osseous findings.  IMPRESSION: Atrophy with small vessel chronic ischemic changes of deep cerebral white matter. 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 No acute intracranial abnormalities. Chronic right maxillary sinus disease.   Critical Value/emergent results were called by telephone at the time of interpretation on 07/08/2012 at 1912 hours to Dr. Lynelle Doctor, who verbally acknowledged these results.   Original Report Authenticated By: Ulyses Southward, M.D.          Subjective: Patient feels that her dysarthria has completely resolved. She states that her left upper extremity weakness has not changed from her baseline. Complains of a mild headache. Denies any visual changes, nausea, chest pain, shortness of breath, abdominal pain, diarrhea. She states that her numbness on the left side of her body has completely resolved.  Objective: Filed Vitals:   07/09/12 0000 07/09/12 0200 07/09/12 0400 07/09/12 0600  BP: 121/53 120/59 129/64 105/59  Pulse: 68 67 69 60  Temp: 97.3 F (36.3 C) 97.4 F (36.3 C) 97.8 F (36.6 C) 97.6 F (36.4 C)  TempSrc: Oral Oral Oral Oral  Resp: 18 18 18 18   Height:      Weight:      SpO2: 98% 97% 96% 100%   No intake or output data in the 24 hours ending 07/09/12 1035 Weight change:  Exam:   General:  Pt is alert, follows commands appropriately, not in acute distress  HEENT: No icterus, No thrush,  [place]/AT  Cardiovascular: RRR, S1/S2, no rubs, no gallops  Respiratory:  diminished breath sounds right base, left basal crackles. No wheezes or rhonchi. Good air movement.  Abdomen: Soft/+BS, non tender, non distended, no guarding  Extremities: No edema, No lymphangitis, No petechiae, No rashes, no synovitis  Neurologic: PERRL, EOMI, no facial asymmetry, posterior pharynx rises symmetrically, no tongue deviation, no dysmetria, strength 4/5 bilateral lower extremities, 4/5 right upper extremity,4-/5 left upper extremity, Babinski response flexor bilateral  Data Reviewed: Basic Metabolic Panel:  Lab 07/08/12 8119  NA 133*  K 4.2  CL 99  CO2 25  GLUCOSE 114*  BUN 17  CREATININE 0.95  CALCIUM 9.1  MG --  PHOS --   Liver Function Tests:  Lab 07/08/12 1902  AST 23  ALT 18   ALKPHOS 109  BILITOT 0.3  PROT 6.7  ALBUMIN 3.5   No results found for this basename: LIPASE:5,AMYLASE:5 in the last 168 hours No results found for this basename: AMMONIA:5 in the last 168 hours CBC:  Lab 07/08/12 1902  WBC 7.6  NEUTROABS 3.2  HGB 13.1  HCT 38.6  MCV 91.9  PLT 216   Cardiac Enzymes:  Lab 07/08/12 1902  CKTOTAL --  CKMB --  CKMBINDEX --  TROPONINI <0.30   BNP: No components found with this basename: POCBNP:5 CBG:  Lab 07/09/12 0326 07/08/12 2315  GLUCAP 111* 127*    No results found for this or any previous visit (from the past 240 hour(s)).   Scheduled Meds:   . sodium chloride   Intravenous STAT  . aspirin  325 mg Oral Daily  . carvedilol  3.125 mg Oral BID WC  . enoxaparin (LOVENOX) injection  40 mg Subcutaneous Q24H  . simvastatin  40 mg Oral q1800   Continuous Infusions:    Nerea Bordenave, DO  [facility] Pager [phone]  If 7PM-7AM, please contact night-coverage [email or web address] Password [email or web address] 07/09/2012, 10:35 AM   LOS: 1 day

## 2012-07-09 NOTE — Progress Notes (Signed)
PT Cancellation Note  Patient Details Name: [name] MRN: [id number] DOB: Feb 20, 1941   Cancelled Treatment:    Reason Eval/Treat Not Completed: Patient at procedure or test/unavailable. Pt at MRI   Milana Kidney 07/09/2012, 4:30 PM

## 2012-07-09 NOTE — Progress Notes (Signed)
UR completed 

## 2012-07-10 LAB — MAGNESIUM: Magnesium: 2.1 mg/dL (ref 1.5–2.5)

## 2012-07-10 LAB — BASIC METABOLIC PANEL
CO2: 23 mEq/L (ref 19–32)
Chloride: 109 mEq/L (ref 96–112)
GFR calc Af Amer: 81 mL/min — ABNORMAL LOW (ref >90)
Potassium: 3.9 mEq/L (ref 3.5–5.1)
Sodium: 141 mEq/L (ref 135–145)

## 2012-07-10 NOTE — Progress Notes (Signed)
[facility] PROGRESS NOTE  Timmie Calix [place] JYN:[id number] DOB: 07/14/1941 DOA: 07/08/2012 PCP: Ralene Ok, MD  Assessment/Plan: -Right MCA stroke -Dysarthria has completely resolved; patient with residual left hand weakness - MRIof brain--multifocal areas right MCA infarct -MRA brain--mild irregularity of the distal left vertebral without stenosis -Carotid ultrasound--negative for ICA stenosis  -Check TSH--1.174 -Check magnesium--2.1 -appreciate neurology eval  -Continue aspirin 325mg  per neurology recommendation -Continue PT/OT -Hemoglobin A1c 6.0 Coronary artery disease/ischemic cardiomyopathy  -Hold antihypertensives for now his blood pressure has been normotensive -Ejection fraction 30-35%  -Well compensated at this time  Hyperlipidemia  -Continue Zocor  -LDL 49  Hypertension  -Systolic blood pressure was marginal this morning, 105/59  -Orthostatic vital signs  -Restart low-dose carvedilol if blood pressure is able to tolerate -Hold losartan for now  History of non-small cell lung cancer  -Status post right lower lobe segmentectomy 2007     Disposition Plan:   Home when medically stable      Procedures/Studies: Ct Head Wo Contrast  07/08/2012  *RADIOLOGY REPORT*  Clinical Data: Code stroke, left side weakness, unsteady gait, headache, drooling  CT HEAD WITHOUT CONTRAST  Technique:  Contiguous axial images were obtained from the base of the skull through the vertex without contrast.  Comparison: None Correlation:  MRI brain 02/18/2012  Findings: Generalized atrophy. Normal ventricular morphology. No midline shift or mass effect. Small vessel chronic ischemic changes of deep cerebral white matter. Large old lacunar infarct left basal ganglia. Old right parietal cortical infarct. No intracranial hemorrhage, mass lesion or evidence of acute infarction. No extra-axial fluid collections. Chronic opacification of right maxillary sinus. No acute osseous findings.   IMPRESSION: Atrophy with small vessel chronic ischemic changes of deep cerebral white matter. 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 No acute intracranial abnormalities. Chronic right maxillary sinus disease.  Critical Value/emergent results were called by telephone at the time of interpretation on 07/08/2012 at 1912 hours to Dr. Lynelle Doctor, who verbally acknowledged these results.   Original Report Authenticated By: Ulyses Southward, M.D.    Mri Brain Without Contrast  07/09/2012  *RADIOLOGY REPORT*  Clinical Data:  Gradual onset of left-sided weakness.  History of hypertension.  MRI HEAD WITHOUT CONTRAST MRA HEAD WITHOUT CONTRAST  Technique:  Multiplanar, multiecho pulse sequences of the brain and surrounding structures were obtained without intravenous contrast. Angiographic images of the head were obtained using MRA technique without contrast.  Comparison:  CT head 07/08/2012.  MRI and MRA 02/18/2012 and 03/05/2012.  MRI HEAD  Findings:   Multifocal areas of infarction involve the right hemisphere within the right MCA territory.  These involve the posterior frontal, parietal, temporal, and insular cortex as well as areas of adjacent subcortical white matter.  There is no associated hemorrhage or mass effect.  There is moderately advanced atrophy for age.  Chronic microvascular ischemic change is fairly extensive involving the periventricular and subcortical white matter.  There is a large area of remote infarction involving the right posterior temporal parietal region; slight T1 prolongation suggest of the lamina necrosis or chronic blood products.  A remote hemorrhagic stroke affects the left basal ganglia and periventricular white matter with peripheral T2 shortening on gradient sequence.  The major intracranial vessels structures appear patent.  The calvarium and skull base are intact.  Pituitary and cerebellar tonsils are unremarkable.  Extensive chronic sinus disease affects the right greater  than left maxillary, ethmoid, and frontal regions.  Bilateral cataract extraction.  IMPRESSION: Multifocal areas of acute infarction, right MCA territory.  These are new from the prior study 02/18/2012.  Chronic changes as described.  MRA HEAD  Findings: Anterior circulation displays no significant stenosis or occlusion of the medial or large size vessels.  Specific attention directed to the right M1 segment where there is no proximal flow reducing lesion or significant irregularity.  Basilar artery widely patent with codominant vertebrals.  Slight irregularity distal left vertebral is nonstenotic.  No cerebellar branch occlusion or intracranial aneurysm.  IMPRESSION: Mild irregularity distal left vertebral is noted.  This is non flow reducing.  Otherwise unremarkable MRA.  No significant change from priors.   Original Report Authenticated By: Davonna Belling, M.D.    Mr Mra Head/brain Wo Cm  07/09/2012  *RADIOLOGY REPORT*  Clinical Data:  Gradual onset of left-sided weakness.  History of hypertension.  MRI HEAD WITHOUT CONTRAST MRA HEAD WITHOUT CONTRAST  Technique:  Multiplanar, multiecho pulse sequences of the brain and surrounding structures were obtained without intravenous contrast. Angiographic images of the head were obtained using MRA technique without contrast.  Comparison:  CT head 07/08/2012.  MRI and MRA 02/18/2012 and 03/05/2012.  MRI HEAD  Findings:   Multifocal areas of infarction involve the right hemisphere within the right MCA territory.  These involve the posterior frontal, parietal, temporal, and insular cortex as well as areas of adjacent subcortical white matter.  There is no associated hemorrhage or mass effect.  There is moderately advanced atrophy for age.  Chronic microvascular ischemic change is fairly extensive involving the periventricular and subcortical white matter.  There is a large area of remote infarction involving the right posterior temporal parietal region; slight T1  prolongation suggest of the lamina necrosis or chronic blood products.  A remote hemorrhagic stroke affects the left basal ganglia and periventricular white matter with peripheral T2 shortening on gradient sequence.  The major intracranial vessels structures appear patent.  The calvarium and skull base are intact.  Pituitary and cerebellar tonsils are unremarkable.  Extensive chronic sinus disease affects the right greater than left maxillary, ethmoid, and frontal regions.  Bilateral cataract extraction.  IMPRESSION: Multifocal areas of acute infarction, right MCA territory. These are new from the prior study 02/18/2012.  Chronic changes as described.  MRA HEAD  Findings: Anterior circulation displays no significant stenosis or occlusion of the medial or large size vessels.  Specific attention directed to the right M1 segment where there is no proximal flow reducing lesion or significant irregularity.  Basilar artery widely patent with codominant vertebrals.  Slight irregularity distal left vertebral is nonstenotic.  No cerebellar branch occlusion or intracranial aneurysm.  IMPRESSION: Mild irregularity distal left vertebral is noted.  This is non flow reducing.  Otherwise unremarkable MRA.  No significant change from priors.   Original Report Authenticated By: Davonna Belling, M.D.          Subjective: Patient is feeling well, but she still has some left upper extremity weakness. Denies any fevers, chills, chest pain, shortness breath, nausea, vomiting, diarrhea, abdominal pain.  Objective: Filed Vitals:   07/09/12 2242 07/10/12 0200 07/10/12 0600 07/10/12 1007  BP: 108/60 109/65 118/61 134/66  Pulse:  61 59 60  Temp:  98 F (36.7 C) 97.9 F (36.6 C) 97 F (36.1 C)  TempSrc:      Resp:  16 16 18   Height:      Weight:      SpO2:  94% 93% 93%    Intake/Output Summary (Last 24 hours) at 07/10/12 1214 Last data filed at 07/10/12 0700  Gross per 24 hour  Intake    540 ml  Output    200 ml   Net    340 ml   Weight change:  Exam:   General:  Pt is alert, follows commands appropriately, not in acute distress  HEENT: No icterus, No thrush,  [place]/AT  Cardiovascular: RRR, S1/S2, no rubs, no gallops  Respiratory: CTA bilaterally, no wheezing, no crackles, no rhonchi  Abdomen: Soft/+BS, non tender, non distended, no guarding  Extremities: No edema, No lymphangitis, No petechiae, No rashes, no synovitis  Data Reviewed: Basic Metabolic Panel:  Lab 07/10/12 1610 07/08/12 1902  NA 141 133*  K 3.9 4.2  CL 109 99  CO2 23 25  GLUCOSE 103* 114*  BUN 11 17  CREATININE 0.82 0.95  CALCIUM 8.8 9.1  MG 2.1 --  PHOS -- --   Liver Function Tests:  Lab 07/08/12 1902  AST 23  ALT 18  ALKPHOS 109  BILITOT 0.3  PROT 6.7  ALBUMIN 3.5   No results found for this basename: LIPASE:5,AMYLASE:5 in the last 168 hours No results found for this basename: AMMONIA:5 in the last 168 hours CBC:  Lab 07/08/12 1902  WBC 7.6  NEUTROABS 3.2  HGB 13.1  HCT 38.6  MCV 91.9  PLT 216   Cardiac Enzymes:  Lab 07/08/12 1902  CKTOTAL --  CKMB --  CKMBINDEX --  TROPONINI <0.30   BNP: No components found with this basename: POCBNP:5 CBG:  Lab 07/09/12 0326 07/08/12 2315  GLUCAP 111* 127*    No results found for this or any previous visit (from the past 240 hour(s)).   Scheduled Meds:   . aspirin  325 mg Oral Daily  . enoxaparin (LOVENOX) injection  40 mg Subcutaneous Q24H  . simvastatin  40 mg Oral q1800   Continuous Infusions:    Sorina Derrig, DO  [facility] Pager [phone]  If 7PM-7AM, please contact night-coverage [email or web address] Password [email or web address] 07/10/2012, 12:14 PM   LOS: 2 days

## 2012-07-10 NOTE — Progress Notes (Signed)
Stroke Team Progress Note  HISTORY [name] is a 71 y.o. right handed female on whom we were asked to consult for evaluation and treatment of a possible stroke. Onset of symptoms was gradual, beginning 07/08/12. Pt lives at home alone and time of onset of symptoms was not clear. At first there was suspected onset of symptoms at 4 pm and then at 6 pm. Pt states she fell off the chair and then started feeling numbness in her L hemibody along with drooling from L side of her face. At around 6pm her brother visited her and took her to emergency department. 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 On admission NIHSS was 3. Due to low NIHSS and no certain time of symptoms of onset she was not a TPA candidate. She was not on anti platelet agents or anticoagulation PTA.   SUBJECTIVE   Overall she feels her condition is significantly resolved except for very mild left sided weakness. Somewhat more than with her previous CVA. Anxious for discharge. Almost back to baseline.  OBJECTIVE Most recent Vital Signs: Filed Vitals:   07/09/12 2242 07/10/12 0200 07/10/12 0600 07/10/12 1007  BP: 108/60 109/65 118/61 134/66  Pulse:  61 59 60  Temp:  98 F (36.7 C) 97.9 F (36.6 C) 97 F (36.1 C)  TempSrc:      Resp:  16 16 18   Height:      Weight:      SpO2:  94% 93% 93%   CBG (last 3)   Basename 07/09/12 0326 07/08/12 2315  GLUCAP 111* 127*    IV Fluid Intake:     MEDICATIONS     . aspirin  325 mg Oral Daily  . enoxaparin (LOVENOX) injection  40 mg Subcutaneous Q24H  . simvastatin  40 mg Oral q1800   PRN:    Diet:  General thin liquids Activity:  Bathroom privileges with assistance DVT Prophylaxis:   Lovenox  CLINICALLY SIGNIFICANT STUDIES Basic Metabolic Panel:   Lab 07/10/12 0650 07/08/12 1902  NA 141 133*  K 3.9 4.2  CL 109 99  CO2 23 25  GLUCOSE 103* 114*  BUN 11 17  CREATININE 0.82 0.95  CALCIUM 8.8 9.1  MG 2.1 --  PHOS -- --   Liver Function Tests:   Lab 07/08/12 1902  AST 23  ALT 18  ALKPHOS 109  BILITOT 0.3  PROT 6.7  ALBUMIN 3.5   CBC:   Lab 07/08/12 1902  WBC 7.6  NEUTROABS 3.2  HGB 13.1  HCT 38.6  MCV 91.9  PLT 216   Coagulation:   Lab 07/08/12 1902  LABPROT 13.1  INR 1.00   Cardiac Enzymes:   Lab 07/08/12 1902  CKTOTAL --  CKMB --  CKMBINDEX --  TROPONINI <0.30   Urinalysis: No results found for this basename: COLORURINE:2,APPERANCEUR:2,LABSPEC:2,PHURINE:2,GLUCOSEU:2,HGBUR:2,BILIRUBINUR:2,KETONESUR:2,PROTEINUR:2,UROBILINOGEN:2,NITRITE:2,LEUKOCYTESUR:2 in the last 168 hours Lipid Panel    Component Value Date/Time   CHOL 132 07/09/2012 0608   TRIG 86 07/09/2012 0608   HDL 66 07/09/2012 0608   CHOLHDL 2.0 07/09/2012 0608   VLDL 17 07/09/2012 0608   LDLCALC 49 07/09/2012 0608   HgbA1C  Lab Results  Component  Value Date   HGBA1C 6.0* 07/08/2012    Urine Drug Screen:     Component Value Date/Time   LABOPIA NONE DETECTED 07/09/2012 0614   COCAINSCRNUR NONE DETECTED 07/09/2012 0614   LABBENZ NONE DETECTED 07/09/2012 0614   AMPHETMU NONE DETECTED 07/09/2012 0614   THCU NONE DETECTED 07/09/2012 0614   LABBARB NONE DETECTED 07/09/2012 1191    Alcohol Level: No results found for this basename: ETH:2 in the last 168 hours  Ct Head Wo Contrast 07/08/2012 IMPRESSION: Atrophy with small vessel chronic ischemic changes of deep cerebral white matter. 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 No acute intracranial abnormalities. Chronic right maxillary sinus disease.Marland Kitchen    MRI of the brain  Multifocal areas of acute infarction, right MCA territory. These  are new from the prior study 02/18/2012.   MRA of the brain   Mild irregularity distal left vertebral is noted. This is non flow  reducing. Otherwise unremarkable MRA. No significant change from  priors.   2D Echocardiogram  - - Left ventricle: LVEF is approximately 45 to 50%with akinesis of the posterior wall (base, mid); mild hypokinesis of the lateral wall and basal inferior wall. The cavity size was mildly dilated. - Mitral valve: Mild regurgitation.    Carotid Doppler  - Preliminary report: There is no ICA stenosis. Vertebral artery flow is antegrade.  EKG  telem - NSR  Therapy Recommendations - Outpatient follow up  Physical Exam  This is a pleasant 71 year old female in bed much more alert and in no acute distress. Speech has improved from last night. Motor strength-5 over 5 in both lower extremities. 4/5 in the left upper extremity. Slight drift. 5 over 5 right upper extremity. Sensation-slightly decreased to light touch on the left both upper and lower extremities. Cerebellar testing-finger to nose testing intact.  Pt was ambulated in room with assistance. Pt still with unstable gait.     ASSESSMENT [name] is a 71 y.o. female presenting with mild left-sided weakness and left facial droop with mildly decreased sensation on the left. Her NIHSS was rated at a 3 on arrival. Imaging confirms an old left basal ganglia and right parietal cortical infarct,  There was no acute abnormality. Infarct felt to be thromboembolic. On No anticoagulants prior to admission. The patient stated that she had had a hemorrhagic CVA in approximately July of this year and had been taken off aspirin at that time. Now back on aspirin 325 mg orally every day for secondary stroke prevention.  Patient with resultant mild left hemiparesis. She also has mild left hemisensory loss.   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  Risk factor modification  Out patient therapy recommended.   Stroke Team will sign off. Follow up with Dr. Pearlean Brownie, Stroke [facility] in 2 months. Call [phone] for appt.   Delton See PA-C [facility] Pager [phone] 07/10/2012, 11:14 AM

## 2012-07-10 NOTE — Evaluation (Signed)
Occupational Therapy Evaluation Patient Details Name: [name] MRN: [id number] DOB: 1941-05-23 Today's Date: 07/10/2012 Time: [phone]  (11:40-11:55, left to allow pt finish eating meal and visit with family per pt request and returned 12:53-13:11 to finish eval session) OT Time Calculation (min): 18 min  OT Assessment / Plan / Recommendation Clinical Impression  Pt admitted with left sided weakness. History of MI and pervious stroke. MRI shows Multifocal areas of infarction involve the right hemisphere within the right MCA territory. These involve the posterior frontal, parietal, temporal, and insular cortex as well as areas of adjacent subcortical white matter. Recommending HHOT vs OPOT.  Pt would benefit from 24 hr sup/assist but sister in law also verbalized that she lives next door and will be with pt frequently throughout day.  Will continue to follow acutely to address below problem list.    OT Assessment  Patient needs continued OT Services    Follow Up Recommendations  Home health OT;Outpatient OT    Barriers to Discharge      Equipment Recommendations  Tub/shower seat    Recommendations for Other Services    Frequency  Min 3X/week    Precautions / Restrictions Precautions Precautions: Fall   Pertinent Vitals/Pain See vitals    ADL  Eating/Feeding: Performed;Set up Where Assessed - Eating/Feeding: Chair Toilet Transfer: Performed;Minimal assistance Toilet Transfer Method: Sit to stand Toilet Transfer Equipment: Comfort height toilet Equipment Used: Gait belt Transfers/Ambulation Related to ADLs: min assist ambulating to bathroom with HHA to RUE ADL Comments: Pt requiring assist to open ketchup packet on meal tray. While eating lunch, pt frequently dropping items when using LUE.  Pt requesting to finish lunch, and OT returned to continue session in PM.   Educated pt on home safety due to decreased functional use of LUE.  Advised pt to use back burners on stove  rather than front burners due to decreased LUE sensation.  Also spoke with pt and family (pt's sister in law and brother) about assist with meal prep. Sister in law reports that she lives next door to pt and will be checking on her frequently.   Recommended pt wait until cleared by MD to drive.    OT Diagnosis: Paresis  OT Problem List: Decreased strength;Decreased range of motion;Impaired balance (sitting and/or standing);Decreased coordination;Decreased knowledge of use of DME or AE;Decreased knowledge of precautions;Impaired UE functional use;Impaired sensation OT Treatment Interventions: Self-care/ADL training;Therapeutic exercise;DME and/or AE instruction;Therapeutic activities;Patient/family education;Balance training   OT Goals Acute Rehab OT Goals OT Goal Formulation: With patient Time For Goal Achievement: 07/17/12 Potential to Achieve Goals: Good ADL Goals Pt Will Perform Grooming: with modified independence;Standing at sink ADL Goal: Grooming - Progress: Goal set today Pt Will Transfer to Toilet: with modified independence;Ambulation;with DME;Regular height toilet ADL Goal: Toilet Transfer - Progress: Goal set today Pt Will Perform Tub/Shower Transfer: Tub transfer;with modified independence;Ambulation;with DME;Shower seat with back ADL Goal: Web designer - Progress: Goal set today Arm Goals Pt Will Complete Theraputty Exer: Independently;to increase strength;Left upper extremity;Min resistance putty Arm Goal: Theraputty Exercises - Progress: Goal set today Additional Arm Goal #1: Pt will independently perform LUE AAROM/AROM. Arm Goal: Additional Goal #1 - Progress: Goal set today Miscellaneous OT Goals Miscellaneous OT Goal #1: Pt will independently perform LUE fine motor HEP. OT Goal: Miscellaneous Goal #1 - Progress: Goal set today  Visit Information  Last OT Received On: 07/10/12 Assistance Needed: +1    Subjective Data      Prior Functioning  Home  Living Lives With: Alone Available Help at Discharge: Family;Available PRN/intermittently Type of Home: House Home Access: Stairs to enter [employer] of Steps: 1 Entrance Stairs-Rails: None Home Layout: One level Bathroom Shower/Tub: Forensic scientist: Standard Bathroom Accessibility: Yes How Accessible: Accessible via walker Home Adaptive Equipment: None Prior Function Level of Independence: Independent Able to Take Stairs?: Yes Driving: Yes Vocation: Retired Musician: No difficulties Dominant Hand: Right         Vision/Perception Praxis Praxis: Impaired Praxis Impairment Details: Psychologist, forensic Comments: LUE   Cognition  Overall Cognitive Status: Appears within functional limits for tasks assessed/performed Arousal/Alertness: Awake/alert Orientation Level: Appears intact for tasks assessed Behavior During Session: [facility] for tasks performed    Extremity/Trunk Assessment Right Upper Extremity Assessment RUE ROM/Strength/Tone: Within functional levels Left Upper Extremity Assessment LUE ROM/Strength/Tone: Deficits LUE ROM/Strength/Tone Deficits: Grip and elbow flexion/extension- 3/5. Shoulder flexion/ext 3-/5.  LUE Sensation: Deficits LUE Sensation Deficits: Decreased sensation to light touch.   LUE Coordination: Deficits LUE Coordination Deficits: decreased gross motor and fine motor coordination     Mobility Bed Mobility Bed Mobility: Not assessed Transfers Transfers: Sit to Stand;Stand to Sit Sit to Stand: 4: Min assist;From chair/3-in-1;From toilet;With upper extremity assist Stand to Sit: 4: Min guard;To chair/3-in-1;To toilet;With upper extremity assist Details for Transfer Assistance: assist for steadying     Shoulder Instructions     Exercise Hand Exercises Forearm Supination: AROM;Left;5 reps Forearm Pronation: AROM;Left;5 reps Digit Composite Flexion: AROM;Left;5 reps Composite  Extension: AROM;Left;5 reps Digit Composite Abduction: AROM;Left;5 reps Digit Composite Adduction: AROM;Left;5 reps Digit Lifts: AROM;Left;5 reps Opposition: AROM;Left;5 reps Other Exercises Other Exercises: Provided pt with soft theraputty (tan) and educated pt on rolling putty into log and using thumb and alternating fingers to pinch along length of log. Other Exercises: Provided pt with marbles and pt picked up marbles individually using palmar grap and placed in cup. Educated pt on stabilizing cup with right hand due to decreased gross motor coordination/motor planning deficits (pt frequently knocking cup over with LUE when cup wasn't stabilized).     Balance     End of Session OT - End of Session Equipment Utilized During Treatment: Gait belt Activity Tolerance: Patient tolerated treatment well Patient left: in chair;with call bell/phone within reach;with family/visitor present  GO    07/10/2012 Cipriano Mile OTR/L Pager [phone] Office [phone]  Cipriano Mile 07/10/2012, 1:49 PM

## 2012-07-10 NOTE — Evaluation (Signed)
Physical Therapy Evaluation Patient Details Name: [name] MRN: [id number] DOB: 02/19/1941 Today's Date: 07/10/2012 Time: [phone] PT Time Calculation (min): 27 min  PT Assessment / Plan / Recommendation Clinical Impression  Pt admitted s/p fall with left sided weakness. Pt currently presenting with left sided weakness, UE>LE with decreased balance and safety. Discussed with pt the need for 24 hr supervision upon initial d/c for safety as well as possible use of assistive device. Pt will benefit from skilled PT in the acute care setting in order to maximzie functional mobility and safety prior to d/c home    PT Assessment  Patient needs continued PT services    Follow Up Recommendations  Outpatient PT;Supervision/Assistance - 24 hour    Does the patient have the potential to tolerate intense rehabilitation      Barriers to Discharge Decreased caregiver support pt states that someone may be available    Equipment Recommendations  Small-based quad cane    Recommendations for Other Services     Frequency Min 4X/week    Precautions / Restrictions Precautions Precautions: Fall Restrictions Weight Bearing Restrictions: No   Pertinent Vitals/Pain No complaints of pain      Mobility  Bed Mobility Bed Mobility: Not assessed Transfers Transfers: Sit to Stand;Stand to Sit Sit to Stand: 4: Min assist;With upper extremity assist;From chair/3-in-1 Stand to Sit: 4: Min guard;With upper extremity assist;To chair/3-in-1 Details for Transfer Assistance: Assist for safety and stability. Ambulation/Gait Ambulation/Gait Assistance: 4: Min assist Ambulation Distance (Feet): 100 Feet Assistive device: Other (Comment);None (IV pole) Ambulation/Gait Assistance Details: Min assist without assistive device, minguard assist with IV pole. Will attempt cane next session as pt is unable to grip RW with L hand. Gait Pattern: Step-to pattern;Decreased stride length;Decreased dorsiflexion -  left;Decreased hip/knee flexion - left Gait velocity: slow gait speed Stairs: No Modified Rankin (Stroke Patients Only) Pre-Morbid Rankin Score: Slight disability Modified Rankin: Moderately severe disability    Shoulder Instructions     Exercises     PT Diagnosis: Difficulty walking;Abnormality of gait  PT Problem List: Decreased strength;Decreased activity tolerance;Decreased balance;Decreased mobility;Decreased knowledge of use of DME;Decreased safety awareness;Decreased knowledge of precautions PT Treatment Interventions: DME instruction;Gait training;Stair training;Functional mobility training;Therapeutic activities;Balance training;Neuromuscular re-education;Patient/family education   PT Goals Acute Rehab PT Goals PT Goal Formulation: With patient Time For Goal Achievement: 07/17/12 Potential to Achieve Goals: Good Pt will go Sit to Stand: with modified independence PT Goal: Sit to Stand - Progress: Goal set today Pt will go Stand to Sit: with modified independence PT Goal: Stand to Sit - Progress: Goal set today Pt will Transfer Bed to Chair/Chair to Bed: with modified independence PT Transfer Goal: Bed to Chair/Chair to Bed - Progress: Goal set today Pt will Ambulate: >150 feet;with supervision;with least restrictive assistive device PT Goal: Ambulate - Progress: Goal set today Pt will Go Up / Down Stairs: 1-2 stairs;with supervision PT Goal: Up/Down Stairs - Progress: Goal set today Additional Goals Additional Goal #1: Pt will score > 19 on the DGI indicating a decreased fall risk for increased safety at home PT Goal: Additional Goal #1 - Progress: Progressing toward goal  Visit Information  Last PT Received On: 07/10/12 Assistance Needed: +1    Subjective Data  Patient Stated Goal: to go home today   Prior Functioning  Home Living Lives With: Alone Available Help at Discharge: Family;Available PRN/intermittently Type of Home: House Home Access: Stairs to  enter [employer] of Steps: 1 Entrance Stairs-Rails: None Home Layout: One level  Bathroom Shower/Tub: Counselling psychologist: Yes How Accessible: Accessible via walker Home Adaptive Equipment: None Prior Function Level of Independence: Independent Able to Take Stairs?: Yes Driving: Yes Vocation: Retired Musician: No difficulties Dominant Hand: Right    Cognition  Overall Cognitive Status: Appears within functional limits for tasks assessed/performed Arousal/Alertness: Awake/alert Orientation Level: Appears intact for tasks assessed Behavior During Session: [facility] for tasks performed    Extremity/Trunk Assessment Right Lower Extremity Assessment RLE ROM/Strength/Tone: Within functional levels RLE Sensation: WFL - Light Touch Left Lower Extremity Assessment LLE ROM/Strength/Tone: Deficits LLE ROM/Strength/Tone Deficits: grossly 4/5 LLE Sensation: Deficits LLE Sensation Deficits: decreased sensation   Balance Balance Balance Assessed: Yes High Level Balance High Level Balance Activites: Head turns;Turns;Sudden stops High Level Balance Comments: Min assist required for stability throughout activities  End of Session PT - End of Session Equipment Utilized During Treatment: Gait belt Activity Tolerance: Patient tolerated treatment well Patient left: in chair;with call bell/phone within reach Nurse Communication: Mobility status  GP     Milana Kidney 07/10/2012, 9:22 AM  07/10/2012 Milana Kidney DPT PAGER: [phone] OFFICE: [phone]

## 2012-07-11 MED ORDER — CARVEDILOL 3.125 MG PO TABS
3.1250 mg | ORAL_TABLET | Freq: Two times a day (BID) | ORAL | Status: DC
  Filled 2012-07-11 (×2): qty 1

## 2012-07-11 MED ORDER — CARVEDILOL 3.125 MG PO TABS: 3.1250 mg | ORAL_TABLET | Freq: Two times a day (BID) | ORAL | Status: DC

## 2012-07-11 MED ORDER — ASPIRIN 325 MG PO TABS: 325.0000 mg | ORAL_TABLET | Freq: Every day | ORAL | Status: DC

## 2012-07-11 NOTE — Progress Notes (Signed)
Occupational Therapy Treatment Patient Details Name: [name] MRN: [id number] DOB: 10/26/1940 Today's Date: 07/11/2012 Time: 4782-9562 OT Time Calculation (min): 53 min  OT Assessment / Plan / Recommendation Comments on Treatment Session This 71 yo female making progress with LUE, will benefit from [facility].    Follow Up Recommendations  Home health OT;Supervision - Intermittent       Equipment Recommendations  Tub/shower seat (with back)       Frequency Min 3X/week   Plan Discharge plan needs to be updated    Precautions / Restrictions Precautions Precautions: Fall Restrictions Weight Bearing Restrictions: No       ADL  Grooming: Performed;Wash/dry hands;Modified independent (increased time due to decreased control LUE) Where Assessed - Grooming: Unsupported standing Lower Body Dressing: Performed;Modified independent Where Assessed - Lower Body Dressing: Unsupported sit to stand (increased time due to decreased control LUE) Toilet Transfer: Performed;Supervision/safety Toilet Transfer Method: Sit to Barista: Comfort height toilet;Grab bars Equipment Used:  (None) Transfers/Ambulation Related to ADLs: Supervision ADL Comments: Talked to son and made him aware that pt is not to drive until cleared by MD and that we recommend intermittent S (this includes someone there when she cooks and showers until they fell she is fine to do these things by herself). Pt aware as well.     OT Goals ADL Goals ADL Goal: Grooming - Progress: Met ADL Goal: Toilet Transfer - Progress: Progressing toward goals Arm Goals Arm Goal: Theraputty Exercises - Progress: Met Arm Goal: Additional Goal #1 - Progress: Progressing toward goals Miscellaneous OT Goals OT Goal: Miscellaneous Goal #1 - Progress: Progressing toward goals  Visit Information  Last OT Received On: 07/11/12 Assistance Needed: +1    Subjective Data  Subjective: My left hand is all that I am  concerned about      Cognition  Overall Cognitive Status: Appears within functional limits for tasks assessed/performed Arousal/Alertness: Awake/alert Orientation Level: Appears intact for tasks assessed Behavior During Session: [facility] for tasks performed    Mobility   Transfers Transfers: Sit to Stand;Stand to Sit Sit to Stand: 6: Modified independent (Device/Increase time);With upper extremity assist;With armrests;From chair/3-in-1 Stand to Sit: 6: Modified independent (Device/Increase time);With upper extremity assist;With armrests;To chair/3-in-1       Exercises  Other Exercises Other Exercises: Gave pt handout for FM exercises/activities and went over them with her and she returned demonstrated. Theraputty handout (already had theraputty and marbles in room) and FM activity sheet (adding folding clothes/hanging clothes and opening/closing household containers).      End of Session OT - End of Session Equipment Utilized During Treatment:  (None) Activity Tolerance: Patient tolerated treatment well Patient left: in chair (with PT in room with her)       Evette Georges [phone] 07/11/2012, 10:05 AM

## 2012-07-11 NOTE — Care Management Note (Signed)
    Page 1 of 1   07/11/2012     2:44:49 PM   CARE MANAGEMENT NOTE 07/11/2012  Patient:  [name], [name]   Account Number:  [id number]  Date Initiated:  07/11/2012  Documentation initiated by:  [facility]  Subjective/Objective Assessment:   Admitted with CVA     Action/Plan:   PT/OT evals-recommended HHPT and HHOT   Anticipated DC Date:  07/11/2012   Anticipated DC Plan:  HOME W HOME HEALTH SERVICES      DC Planning Services  CM consult      Choice offered to / List presented to:  C-1 Patient        HH arranged  HH-2 PT  HH-3 OT      [facility] agency  [facility]   Status of service:  Completed, signed off Medicare Important Message given?   (If response is "NO", the following Medicare IM given date fields will be blank) Date Medicare IM given:   Date Additional Medicare IM given:    Discharge Disposition:  HOME W HOME HEALTH SERVICES  Per UR Regulation:  Reviewed for med. necessity/level of care/duration of stay  If discussed at Long Length of Stay Meetings, dates discussed:    Comments:  07/09/15 Spoke with patient about HHC. She chose [facility] from the [facility] list of [facility] agencies. Patent stated that she has a rolling walker, does not want a cane and will obtain a tub bench from the equipment store near her home. Contacted Madline Oesterling at [facility] and requested HHPT and HHOT. Jacquelynn Cree RN, BSN, CCM

## 2012-07-11 NOTE — Progress Notes (Signed)
Physical Therapy Treatment Patient Details Name: [name] MRN: [id number] DOB: 1941-07-15 Today's Date: 07/11/2012 Time: 3086-5784 PT Time Calculation (min): 23 min  PT Assessment / Plan / Recommendation Comments on Treatment Session  Pt moving well at this date.  Cont's to have L sided weakness UE>LE but she states it has greatly improved compared to yesterday.  Pt ambulated with RW today & had no issue with gripping RW.  Pt reports she would prefer to use RW rather than cane & has access to RW.   She is very aware of defecits.  Due to pt living by herself, she would benefit from HHPT prior to Outpatient PT.  D/c plans updated.      Follow Up Recommendations  Home health PT;Supervision - Intermittent     Does the patient have the potential to tolerate intense rehabilitation     Barriers to Discharge        Equipment Recommendations   (Pt states she has access to RW)    Recommendations for Other Services    Frequency Min 4X/week   Plan Discharge plan needs to be updated    Precautions / Restrictions Precautions Precautions: Fall Restrictions Weight Bearing Restrictions: No       Mobility  Bed Mobility Bed Mobility: Not assessed Transfers Transfers: Sit to Stand;Stand to Sit Sit to Stand: 6: Modified independent (Device/Increase time);With upper extremity assist;With armrests;From chair/3-in-1 Stand to Sit: 6: Modified independent (Device/Increase time);With upper extremity assist;With armrests;To chair/3-in-1 Details for Transfer Assistance: Pt Mod I for sit<>stand transfers at this date.   Had pt perform transfers with use of  LUE only for strengthening purposes & to increase functional use of UE.    Ambulation/Gait Ambulation/Gait Assistance: 4: Min guard Ambulation Distance (Feet): 400 Feet Assistive device: Rolling walker Ambulation/Gait Assistance Details: Pt requesting to use RW rather than Cane today.  Pt did well with taking her time to ensure L UE positioned  properly on RW before initiating gait.  Pt demonstrated no girpping issues with L UE on RW at this time.  Cues for body positioning inside RW.   Gait Pattern: Step-through pattern;Decreased stride length General Gait Details: Pt used RW today per her request.  She states she has access to RW for home use.   Stairs: Yes Stairs Assistance: 4: Min guard Stair Management Technique: Two rails;Forwards;Step to pattern Number of Stairs: 5  Wheelchair Mobility Wheelchair Mobility: No Modified Rankin (Stroke Patients Only) Pre-Morbid Rankin Score: Slight disability Modified Rankin: Moderately severe disability    Exercises Other Exercises Other Exercises: Gave pt handout for FM exercises/activities and went over them with her and she returned demonstrated. Theraputty handout (already had theraputty and marbles in room) and FM activity sheet (adding folding clothes/hanging clothes and opening/closing household containers).     PT Goals Acute Rehab PT Goals Time For Goal Achievement: 07/17/12 Potential to Achieve Goals: Good Pt will go Sit to Stand: with modified independence PT Goal: Sit to Stand - Progress: Met Pt will go Stand to Sit: with modified independence PT Goal: Stand to Sit - Progress: Met Pt will Transfer Bed to Chair/Chair to Bed: with modified independence Pt will Ambulate: >150 feet;with supervision;with least restrictive assistive device PT Goal: Ambulate - Progress: Progressing toward goal Pt will Go Up / Down Stairs: 1-2 stairs;with supervision PT Goal: Up/Down Stairs - Progress: Progressing toward goal Additional Goals Additional Goal #1: Pt will score > 19 on the DGI indicating a decreased fall risk for increased safety at home  Visit Information  Last PT Received On: 07/11/12 Assistance Needed: +1    Subjective Data  Patient Stated Goal: to go home    Cognition  Overall Cognitive Status: Appears within functional limits for tasks  assessed/performed Arousal/Alertness: Awake/alert Orientation Level: Appears intact for tasks assessed Behavior During Session: [facility] for tasks performed    Balance  Balance Balance Assessed: Yes Static Standing Balance Static Standing - Balance Support: No upper extremity supported Static Standing - Level of Assistance: 5: Stand by assistance  End of Session PT - End of Session Equipment Utilized During Treatment: Gait belt Activity Tolerance: Patient tolerated treatment well Patient left: in chair;with call bell/phone within reach Nurse Communication: Mobility status    Verdell Face, [place] [phone] 07/11/2012

## 2012-07-11 NOTE — Progress Notes (Signed)
Speech Language Pathology Dysphagia Treatment Patient Details Name: [name] MRN: [id number] DOB: Dec 19, 1940 Today's Date: 07/11/2012 Time: 4098-1191 SLP Time Calculation (min): 10 min  Assessment / Plan / Recommendation Clinical Impression  F/u diet tolerance revealed continued functional oropharyngeal swallow without overt s/s of aspiration and independent use of safe swallowing precautions. No further SLP needs indicated at this time. Signing off. Please reconsult if needed.     Diet Recommendation  Continue with Current Diet: Regular;Thin liquid    SLP Plan All goals met   Pertinent Vitals/Pain n/a   Swallowing Goals  SLP Swallowing Goals Patient will consume recommended diet without observed clinical signs of aspiration with: Modified independent assistance Swallow Study Goal #1 - Progress: Met Patient will utilize recommended strategies during swallow to increase swallowing safety with: Modified independent assistance Swallow Study Goal #2 - Progress: Met  General Temperature Spikes Noted: No Respiratory Status: Room air Behavior/Cognition: Alert;Cooperative;Pleasant mood Oral Cavity - Dentition: Dentures, top;Adequate natural dentition Patient Positioning: Upright in bed   Dysphagia Treatment Treatment focused on: Skilled observation of diet tolerance Treatment Methods/Modalities: Skilled observation Patient observed directly with PO's: Yes Type of PO's observed: Regular;Thin liquids Feeding: Able to feed self Liquids provided via: [employer] MA, CCC-SLP [phone]   Claudius Mich Meryl 07/11/2012, 11:56 AM

## 2012-07-11 NOTE — Discharge Summary (Signed)
Physician Discharge Summary  Jodi [name] AOZ:[id number] DOB: 11/10/1940 DOA: 07/08/2012  PCP: Ralene Ok, MD  Admit date: 07/08/2012 Discharge date: 07/11/2012  Recommendations for Outpatient Follow-up:  1. Pt will need to follow up with PCP in 2 weeks post discharge 2. Follow up at outpatient stroke clinic in 2 months 3. Recheck BMP in one week and provide instructions to patient whether she needs to restart losartan, KCL, or Lasix with follow up appointment.  Discharge Diagnoses:  Acute Right MCA stroke  -Dysarthria has completely resolved; patient with residual left hand weakness  - MRIof brain--multifocal areas right MCA infarct  -MRA brain--mild irregularity of the distal left vertebral without stenosis  -Carotid ultrasound--negative for ICA stenosis  -Check TSH--1.174  -Check magnesium--2.1  -appreciate neurology eval  -Continue aspirin 325mg  per neurology recommendation  -Continue PT/OT  -Hemoglobin A1c 6.0  Coronary artery disease/ischemic cardiomyopathy  -Hold antihypertensives for now his blood pressure has been normotensive  -Ejection fraction 30-35%  -Well compensated at this time  Hyperlipidemia  -Continue Zocor  -LDL 49  Hypertension  -Systolic blood pressure was marginal this morning, 105/59  -Orthostatic vital signs  -Restart low-dose carvedilol if blood pressure is able to tolerate  -Hold losartan for now  History of non-small cell lung cancer  -Status post right lower lobe segmentectomy 2007   Discharge Condition: stable   Disposition:  Follow-up Information    Follow up with SETHI,PRAMODKUMAR P, MD. In 2 days. (Call in two days for appt in 2 months)    Contact information:   [address], [address] [facility] [place] [address] [phone]        d/c home  Diet:cardiac Wt Readings from Last 3 Encounters:  07/08/12 62.914 kg (138 lb 11.2 oz)  05/06/12 65.318 kg (144 lb)  04/21/12 63.957 kg (141 lb)    History of  present illness:  71 y.o. right handed [name] presented with left hemiparesis.  Onset of symptoms was gradual, beginning today. Pt lives at home alone and time of onset of symptoms is not clear. At first there was suspected onset of symptoms at 4 pm and then at 6 pm on the day of admission. Pt states she fell off the chair and then started feeling numbness in her L hemibody along with drooling from L side of her face. At around 6pm her brother visited her and took her to emergency department. 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 On admission NIHSS is 3. Due to low NIHSS and no certain time of symptoms of onset she is not a TPA candidate. She is not on anti platelet agents or anticoagulation. The patient had a subacute hemorrhagic infarct in the parietal lobe in July 2013.   Hospital Course:  Mr. Sena Slate consulted to see the patient. A full neurologic workup was undertaken. MRI of the brain was obtained and showed new multifocal areas of right MCA distribution infarction. There was no hemorrhage. MRA of the brain showed mild irregularity of the distal left vertebral without stenosis. The basilar artery was patent. Otherwise MRA was unremarkable. Because of the patient's dizziness, orthostatic vitals were performed, and they were negative. The patient was started  on aspirin 325 mg daily. The patient was allowed to have permissive hypertension in the first 24 hours, although the patient was never hypertensive. The patient was kept off of her antihypertensive medications, and her blood pressure remained stable. The patient was restarted on low-dose carvedilol 3.125 mg twice a day. The patient remained off of her furosemide for  the duration of the hospitalization. Her renal function remains stable. The patient was instructed to follow up with her primary care provider for further instructions of whether she will need to restart furosemide as well as no certain and potassium chloride. in the future. Hemoglobin A1c was found to be 6.0. Urine toxicology was negative. TSH was 1.174. The patient passed her swallow evaluation. The patient's dysarthria returned to normal. Echocardiogram showed ejection fraction 45-50% with mild hypokinesis of the lateral and basilar wall. Carotid ultrasound was obtained and it negative for hemodynamically significant internal carotid artery stenosis. The patient was found to have total cholesterol 132, HDL 66, LDL 49, triglycerides 86. The patient was continued on her Zocor 40 mg daily. Physical therapy and occupational therapy were consulted to see the patient. They recommended outpatient PT/OT, but the patient stated that she would prefer to have home health come to the house. This was arranged prior to the patient's discharge.  Consultants: neurology  Discharge Exam: Filed Vitals:   07/11/12 1013  BP: 118/73  Pulse: 70  Temp: 97.6 F (36.4 C)  Resp: 18   Filed Vitals:   07/10/12 2200 07/11/12 0200 07/11/12 0600 07/11/12 1013  BP: 120/51 123/65 128/65 118/73  Pulse: 65 75 69 70  Temp: 97.7 F (36.5 C) 97 F (36.1 C) 98.1 F (36.7 C) 97.6 F (36.4 C)  TempSrc: Oral Oral Oral Oral  Resp: 18 18 18 18   Height:      Weight:      SpO2: 98% 99% 97% 98%   General: A&O x 3, NAD, pleasant, cooperative Cardiovascular: RRR, no rub, no gallop, no S3 Respiratory: CTAB, no wheeze, no rhonchi Abdomen:soft, nontender, nondistended, positive bowel sounds Extremities: No edema, No lymphangitis, no petechiae  Discharge Instructions      Discharge Orders    Future Appointments: Provider: Department: Dept Phone: Center:   08/16/2012 9:45 AM Tonny Bollman, MD [facility] Main Office  [place]) [phone] [id number]   04/11/2013 8:00 AM Krista Blue [facility] MEDICAL ONCOLOGY [phone] None   04/11/2013 8:30 AM [facility] [phone] [place]   04/18/2013 9:00 AM Si Gaul, MD [facility] MEDICAL ONCOLOGY [phone] None       Medication List     As of 07/11/2012 12:12 PM    ASK your doctor about these medications         calcium carbonate 1250 MG tablet   Commonly known as: OS-CAL - dosed in mg of elemental calcium   Take 1 tablet by mouth daily.      carvedilol 12.5 MG tablet   Commonly known as: COREG   Take 1 tablet (12.5 mg total) by mouth 2 (two) times daily with a meal.      famotidine 40 MG tablet   Commonly known as: PEPCID   Take 1 tablet (40 mg total) by mouth daily.      furosemide 40 MG tablet   Commonly known as: LASIX   1 tab am and 1/2 pm      losartan 50 MG tablet   Commonly known as: COZAAR  Take 1 tablet (50 mg total) by mouth daily.      nitroGLYCERIN 0.4 MG SL tablet   Commonly known as: NITROSTAT   Place 1 tablet (0.4 mg total) under the tongue every 5 (five) minutes as needed.      potassium chloride SA 20 MEQ tablet   Commonly known as: K-DUR,KLOR-CON   Take 1 tablet (20 mEq total) by mouth daily.      simvastatin 40 MG tablet   Commonly known as: ZOCOR   Take 1 tablet (40 mg total) by mouth at bedtime.           The results of significant diagnostics from this hospitalization (including imaging, microbiology, ancillary and laboratory) are listed below for reference.    Significant Diagnostic Studies: Ct Head Wo Contrast  07/08/2012  *RADIOLOGY REPORT*  Clinical Data: Code stroke, left side weakness, unsteady gait, headache, drooling  CT HEAD WITHOUT CONTRAST  Technique:  Contiguous axial images were obtained from the base of the skull through the vertex without contrast.  Comparison: None Correlation:  MRI brain 02/18/2012   Findings: Generalized atrophy. Normal ventricular morphology. No midline shift or mass effect. Small vessel chronic ischemic changes of deep cerebral white matter. Large old lacunar infarct left basal ganglia. Old right parietal cortical infarct. No intracranial hemorrhage, mass lesion or evidence of acute infarction. No extra-axial fluid collections. Chronic opacification of right maxillary sinus. No acute osseous findings.  IMPRESSION: Atrophy with small vessel chronic ischemic changes of deep cerebral white matter. 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 No acute intracranial abnormalities. Chronic right maxillary sinus disease.  Critical Value/emergent results were called by telephone at the time of interpretation on 07/08/2012 at 1912 hours to Dr. Lynelle Doctor, who verbally acknowledged these results.   Original Report Authenticated By: Ulyses Southward, M.D.    Mri Brain Without Contrast  07/09/2012  *RADIOLOGY REPORT*  Clinical Data:  Gradual onset of left-sided weakness.  History of hypertension.  MRI HEAD WITHOUT CONTRAST MRA HEAD WITHOUT CONTRAST  Technique:  Multiplanar, multiecho pulse sequences of the brain and surrounding structures were obtained without intravenous contrast. Angiographic images of the head were obtained using MRA technique without contrast.  Comparison:  CT head 07/08/2012.  MRI and MRA 02/18/2012 and 03/05/2012.  MRI HEAD  Findings:   Multifocal areas of infarction involve the right hemisphere within the right MCA territory.  These involve the posterior frontal, parietal, temporal, and insular cortex as well as areas of adjacent subcortical white matter.  There is no associated hemorrhage or mass effect.  There is moderately advanced atrophy for age.  Chronic microvascular ischemic change is fairly extensive involving the periventricular and subcortical white matter.  There is a large area of remote infarction involving the right posterior temporal parietal region; slight  T1 prolongation suggest of the lamina necrosis or chronic blood products.  A remote hemorrhagic stroke affects the left basal ganglia and periventricular white matter with peripheral T2 shortening on gradient sequence.  The major intracranial vessels structures appear patent.  The calvarium and skull base are intact.  Pituitary and cerebellar tonsils are unremarkable.  Extensive chronic sinus disease affects the right greater than left maxillary, ethmoid, and frontal regions.  Bilateral cataract extraction.  IMPRESSION: Multifocal areas of acute infarction, right MCA territory. These are new from the prior study 02/18/2012.  Chronic changes as described.  MRA HEAD  Findings: Anterior circulation displays no significant stenosis or occlusion of the medial or large size vessels.  Specific attention  directed to the right M1 segment where there is no proximal flow reducing lesion or significant irregularity.  Basilar artery widely patent with codominant vertebrals.  Slight irregularity distal left vertebral is nonstenotic.  No cerebellar branch occlusion or intracranial aneurysm.  IMPRESSION: Mild irregularity distal left vertebral is noted.  This is non flow reducing.  Otherwise unremarkable MRA.  No significant change from priors.   Original Report Authenticated By: Davonna Belling, M.D.    Mr Mra Head/brain Wo Cm  07/09/2012  *RADIOLOGY REPORT*  Clinical Data:  Gradual onset of left-sided weakness.  History of hypertension.  MRI HEAD WITHOUT CONTRAST MRA HEAD WITHOUT CONTRAST  Technique:  Multiplanar, multiecho pulse sequences of the brain and surrounding structures were obtained without intravenous contrast. Angiographic images of the head were obtained using MRA technique without contrast.  Comparison:  CT head 07/08/2012.  MRI and MRA 02/18/2012 and 03/05/2012.  MRI HEAD  Findings:   Multifocal areas of infarction involve the right hemisphere within the right MCA territory.  These involve the posterior frontal,  parietal, temporal, and insular cortex as well as areas of adjacent subcortical white matter.  There is no associated hemorrhage or mass effect.  There is moderately advanced atrophy for age.  Chronic microvascular ischemic change is fairly extensive involving the periventricular and subcortical white matter.  There is a large area of remote infarction involving the right posterior temporal parietal region; slight T1 prolongation suggest of the lamina necrosis or chronic blood products.  A remote hemorrhagic stroke affects the left basal ganglia and periventricular white matter with peripheral T2 shortening on gradient sequence.  The major intracranial vessels structures appear patent.  The calvarium and skull base are intact.  Pituitary and cerebellar tonsils are unremarkable.  Extensive chronic sinus disease affects the right greater than left maxillary, ethmoid, and frontal regions.  Bilateral cataract extraction.  IMPRESSION: Multifocal areas of acute infarction, right MCA territory. These are new from the prior study 02/18/2012.  Chronic changes as described.  MRA HEAD  Findings: Anterior circulation displays no significant stenosis or occlusion of the medial or large size vessels.  Specific attention directed to the right M1 segment where there is no proximal flow reducing lesion or significant irregularity.  Basilar artery widely patent with codominant vertebrals.  Slight irregularity distal left vertebral is nonstenotic.  No cerebellar branch occlusion or intracranial aneurysm.  IMPRESSION: Mild irregularity distal left vertebral is noted.  This is non flow reducing.  Otherwise unremarkable MRA.  No significant change from priors.   Original Report Authenticated By: Davonna Belling, M.D.      Microbiology: No results found for this or any previous visit (from the past 240 hour(s)).   Labs: Basic Metabolic Panel:  Lab 07/10/12 1478 07/08/12 1902  NA 141 133*  K 3.9 4.2  CL 109 99  CO2 23 25   GLUCOSE 103* 114*  BUN 11 17  CREATININE 0.82 0.Jodi  CALCIUM 8.8 9.1  MG 2.1 --  PHOS -- --   Liver Function Tests:  Lab 07/08/12 1902  AST 23  ALT 18  ALKPHOS 109  BILITOT 0.3  PROT 6.7  ALBUMIN 3.5   No results found for this basename: LIPASE:5,AMYLASE:5 in the last 168 hours No results found for this basename: AMMONIA:5 in the last 168 hours CBC:  Lab 07/08/12 1902  WBC 7.6  NEUTROABS 3.2  HGB 13.1  HCT 38.6  MCV 91.9  PLT 216   Cardiac Enzymes:  Lab 07/08/12 1902  CKTOTAL --  CKMB --  CKMBINDEX --  TROPONINI <0.30   BNP: No components found with this basename: POCBNP:5 CBG:  Lab 07/09/12 0326 07/08/12 2315  GLUCAP 111* 127*    Time coordinating discharge:  Greater than 30 minutes  Signed:  Lennard Capek, DO [facility] Pager: [phone] 07/11/2012, 12:12 PM

## 2012-07-12 NOTE — Progress Notes (Signed)
Reviewed and agree with plan of care. Jermany Sundell, PT DPT  [phone]  

## 2012-08-16 ENCOUNTER — Ambulatory Visit (INDEPENDENT_AMBULATORY_CARE_PROVIDER_SITE_OTHER): Payer: Medicare Other | Admitting: Cardiovascular Disease

## 2012-08-16 ENCOUNTER — Encounter: Payer: Self-pay | Admitting: Cardiovascular Disease

## 2012-08-16 ENCOUNTER — Telehealth: Payer: Self-pay | Admitting: Cardiovascular Disease

## 2012-08-16 VITALS — BP 152/85 | HR 67 | Ht 67.0 in | Wt 146.4 lb

## 2012-08-16 DIAGNOSIS — I5023 Acute on chronic systolic (congestive) heart failure: Secondary | ICD-10-CM

## 2012-08-16 DIAGNOSIS — I2510 Atherosclerotic heart disease of native coronary artery without angina pectoris: Secondary | ICD-10-CM

## 2012-08-16 LAB — BASIC METABOLIC PANEL
CO2: 28 mEq/L (ref 19–32)
Chloride: 107 mEq/L (ref 96–112)
Sodium: 139 mEq/L (ref 135–145)

## 2012-08-16 LAB — BRAIN NATRIURETIC PEPTIDE: Pro B Natriuretic peptide (BNP): 510 pg/mL — ABNORMAL HIGH (ref 0.0–100.0)

## 2012-08-16 MED ORDER — POTASSIUM CHLORIDE ER 10 MEQ PO TBCR: 10.0000 meq | EXTENDED_RELEASE_TABLET | Freq: Two times a day (BID) | ORAL | Status: DC

## 2012-08-16 MED ORDER — LOSARTAN POTASSIUM 50 MG PO TABS: 50.0000 mg | ORAL_TABLET | Freq: Every day | ORAL | Status: DC

## 2012-08-16 MED ORDER — FUROSEMIDE 40 MG PO TABS: 40.0000 mg | ORAL_TABLET | Freq: Every day | ORAL | Status: DC

## 2012-08-16 NOTE — Patient Instructions (Signed)
Your physician has recommended you make the following change in your medication: START Furosemide 40mg  take one by mouth daily, START Potassium Chloride take one by mouth daily, Losartan 50mg  take one by mouth daily  Your physician recommends that you have lab work today: BMP and BNP  A chest x-ray takes a picture of the organs and structures inside the chest, including the heart, lungs, and blood vessels. This test can show several things, including, whether the heart is enlarges; whether fluid is building up in the lungs; and whether pacemaker / defibrillator leads are still in place.  Your physician has recommended that you have a pulmonary function test. Pulmonary Function Tests are a group of tests that measure how well air moves in and out of your lungs.  Your physician recommends that you return for lab work in: 2 WEEKS (BMP)  Your physician recommends that you schedule a follow-up appointment in: 8 WEEKS with Dr Excell Seltzer

## 2012-08-16 NOTE — Progress Notes (Signed)
HPI:  72 year old woman presenting for followup evaluation. The patient has a history of coronary artery disease and myocardial infarction in 2010. She has had significant LV dysfunction with akinesis of the posterolateral myocardium related to her old infarct. When I last saw her I recommended an increase in her antihypertensive medications to treat her cardiomyopathy more aggressively. 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 Repeat echocardiogram during that hospital admission showed left ventricular ejection fraction of 45%.  The patient was recently discharged from inpatient rehabilitation. She's had a good recovery from her stroke. She has some mild residual left-sided weakness, but overall is doing well. Her speech has returned to normal. Her biggest complaint now is shortness of breath. This is been somewhat a long-standing complaint, but it is much worse. She admits to orthopnea and nocturnal cough. She denies lightheadedness, chest pain, or syncope. She has dyspnea at rest and with activity. Several of her medicines were stopped during her hospital stay. In review of records, her blood pressure was in the low-normal range and with her recent stroke there was a concern about iatrogenic hypotension.  Outpatient Encounter Prescriptions as of 08/16/2012  Medication Sig Dispense Refill  . aspirin 325 MG tablet Take 1 tablet (325 mg total) by mouth daily.      . calcium carbonate (OS-CAL - DOSED IN MG OF ELEMENTAL CALCIUM) 1250 MG tablet Take 1 tablet by mouth daily.      . carvedilol (COREG) 3.125 MG tablet Take 1 tablet (3.125 mg total) by mouth 2 (two) times daily with a meal.  60 tablet  3  . famotidine (PEPCID) 40 MG tablet Take 1 tablet (40 mg total) by mouth daily.  30 tablet  11  . nitroGLYCERIN (NITROSTAT) 0.4 MG SL tablet Place 1 tablet (0.4 mg total) under the tongue every 5 (five) minutes as needed.  25 tablet  2  . simvastatin (ZOCOR) 40 MG tablet Take 1 tablet  (40 mg total) by mouth at bedtime.  30 tablet  9    No Known Allergies  Past Medical History  Diagnosis Date  . HTN (hypertension)   . Arthritis   . Macular degeneration   . Myocardial infarction     Acute myocardial infarction 2010 - treated with BMS of LCx. LVEF 50%, with subsequent CHF  . H/O: hysterectomy   . Lung cancer dx'd 07/2006    Lung CA, s/p resection, followed by Dr Shirline Frees  . Stroke     ROS: Negative except as per HPI  BP 152/85  Pulse 67  Ht 5\' 7"  (1.702 m)  Wt 66.407 kg (146 lb 6.4 oz)  BMI 22.93 kg/m2  PHYSICAL EXAM: Pt is alert and oriented, NAD HEENT: normal Neck: JVP - normal, carotids 2+= without bruits Lungs: Decreased breath sounds bilaterally, left worse than right. There are no inspiratory crackles noted. There is no expiratory wheezing. CV: RRR without murmur or gallop Abd: soft, NT, Positive BS, no hepatomegaly Ext: 1+ edema on the right, trace on the left, distal pulses intact and equal Skin: warm/dry no rash  EKG:  Sinus rhythm with PVCs, heart rate 73 beats per minute, possible septal infarct age undetermined, ST and T. abnormality consider lateral ischemia.  ASSESSMENT AND PLAN: 1. Acute on chronic dyspnea. Suspect there is a component of acute on chronic mixed congestive heart failure, but also probably a pulmonary component in this long-standing smoker who has now quit for several years. Her last CT scan of the chest demonstrated  emphysematous changes. Plan to resume some of her cardiac medications now that blood pressure is elevated. Also will evaluate her with a repeat chest x-ray and BNP level as well as pulmonary function test. Pending these results, will consider referral to pulmonology.  2. Acute on chronic systolic and diastolic heart failure. Recommend furosemide 40 mg daily and K-Dur 10 milliequivalents daily. He'll also resume losartan 50 mg daily. Will check a baseline metabolic panel today and repeat in 2 weeks after initiating  losartan and furosemide. She should remain on carvedilol.  3. Coronary artery disease, native vessel. The patient is status post inferoposterior MI approximately 4 years ago. She remains on aspirin 325 mg daily and simvastatin. Will continue same medications. No evidence of recurrent ischemia.  For followup, I would like to see the patient back in approximately 8 weeks. We'll consider pulmonary referral based on results of testing as outlined above.  Tonny Bollman 08/16/2012 10:32 AM

## 2012-08-16 NOTE — Telephone Encounter (Signed)
Pt needs nitro sent into [facility] in [place] [phone]

## 2012-08-17 ENCOUNTER — Other Ambulatory Visit: Payer: Self-pay

## 2012-08-17 DIAGNOSIS — I5023 Acute on chronic systolic (congestive) heart failure: Secondary | ICD-10-CM

## 2012-08-17 DIAGNOSIS — I2510 Atherosclerotic heart disease of native coronary artery without angina pectoris: Secondary | ICD-10-CM

## 2012-08-17 MED ORDER — NITROGLYCERIN 0.4 MG SL SUBL: 0.4000 mg | SUBLINGUAL_TABLET | SUBLINGUAL | Status: DC | PRN

## 2012-08-17 NOTE — Telephone Encounter (Signed)
I spoke with the pt and Rx sent to pharmacy.

## 2012-08-30 ENCOUNTER — Other Ambulatory Visit: Payer: Self-pay | Admitting: *Deleted

## 2012-08-30 ENCOUNTER — Other Ambulatory Visit (INDEPENDENT_AMBULATORY_CARE_PROVIDER_SITE_OTHER): Payer: Medicare Other

## 2012-08-30 DIAGNOSIS — I2510 Atherosclerotic heart disease of native coronary artery without angina pectoris: Secondary | ICD-10-CM

## 2012-08-30 DIAGNOSIS — E875 Hyperkalemia: Secondary | ICD-10-CM

## 2012-08-30 DIAGNOSIS — I5023 Acute on chronic systolic (congestive) heart failure: Secondary | ICD-10-CM

## 2012-08-30 LAB — BASIC METABOLIC PANEL
CO2: 31 mEq/L (ref 19–32)
Chloride: 103 mEq/L (ref 96–112)
Potassium: 5.3 mEq/L — ABNORMAL HIGH (ref 3.5–5.1)
Sodium: 139 mEq/L (ref 135–145)

## 2012-09-02 ENCOUNTER — Ambulatory Visit (INDEPENDENT_AMBULATORY_CARE_PROVIDER_SITE_OTHER)
Admission: RE | Admit: 2012-09-02 | Discharge: 2012-09-02 | Disposition: A | Discharge status: Home / Self Care | Payer: Medicare Other | Source: Ambulatory Visit | Attending: Cardiovascular Disease | Admitting: Cardiovascular Disease

## 2012-09-02 ENCOUNTER — Ambulatory Visit (INDEPENDENT_AMBULATORY_CARE_PROVIDER_SITE_OTHER): Payer: Medicare Other | Admitting: Internal Medicine

## 2012-09-02 DIAGNOSIS — I5023 Acute on chronic systolic (congestive) heart failure: Secondary | ICD-10-CM

## 2012-09-02 DIAGNOSIS — R0602 Shortness of breath: Secondary | ICD-10-CM

## 2012-09-02 DIAGNOSIS — I2510 Atherosclerotic heart disease of native coronary artery without angina pectoris: Secondary | ICD-10-CM

## 2012-09-02 LAB — PULMONARY FUNCTION TEST

## 2012-09-02 NOTE — Progress Notes (Signed)
PFT done today. 

## 2012-09-06 ENCOUNTER — Telehealth: Payer: Self-pay | Admitting: Cardiovascular Disease

## 2012-09-06 NOTE — Telephone Encounter (Signed)
I spoke with the pt and she has been exercising on the treadmill for the past 3 days.  The pt does monitor her pulse while on the treadmill and it has reached as high as 177.  I made the pt aware that a safer target heart rate for her age would be 130-140.  The pt will continue to exercise and call the office with any other questions or concerns.

## 2012-09-06 NOTE — Telephone Encounter (Signed)
New Problem    Pt had a stroke on 07/08/12 and wants to know if its safe for her to be on a treadmill. Pt states she also has some other questions she would like to ask the nurse.

## 2012-09-13 ENCOUNTER — Other Ambulatory Visit (INDEPENDENT_AMBULATORY_CARE_PROVIDER_SITE_OTHER): Payer: Medicare Other

## 2012-09-13 DIAGNOSIS — E875 Hyperkalemia: Secondary | ICD-10-CM

## 2012-09-13 LAB — BASIC METABOLIC PANEL
BUN: 15 mg/dL (ref 6–23)
CO2: 30 mEq/L (ref 19–32)
Chloride: 104 mEq/L (ref 96–112)
GFR: 63.03 mL/min (ref >60.00)
Glucose, Bld: 94 mg/dL (ref 70–99)
Potassium: 4.3 mEq/L (ref 3.5–5.1)
Sodium: 141 mEq/L (ref 135–145)

## 2012-09-15 ENCOUNTER — Telehealth: Payer: Self-pay | Admitting: Cardiovascular Disease

## 2012-09-15 DIAGNOSIS — R0602 Shortness of breath: Secondary | ICD-10-CM

## 2012-09-15 DIAGNOSIS — R911 Solitary pulmonary nodule: Secondary | ICD-10-CM

## 2012-09-16 ENCOUNTER — Encounter: Payer: Self-pay | Admitting: Cardiovascular Disease

## 2012-09-19 ENCOUNTER — Encounter: Payer: Self-pay | Admitting: Cardiovascular Disease

## 2012-09-19 NOTE — Telephone Encounter (Signed)
This encounter was created in error - please disregard.

## 2012-09-19 NOTE — Telephone Encounter (Signed)
I spoke with the pt and made her aware of PFT, chest x-ray and lab results.  The pt has been scheduled for CT of Chest w/o Contrast on 09/21/12 at 10:30.  I will refer the pt to pulmonary for further evaluation of abnormal PFT results. [facility] will contact the pt with pulmonary appointment.

## 2012-09-19 NOTE — Telephone Encounter (Signed)
Left message to call back  

## 2012-09-19 NOTE — Telephone Encounter (Signed)
New problem    Returning call back to nurse from Friday.

## 2012-09-19 NOTE — Telephone Encounter (Signed)
PT RTN CALL TO Leotis Shames

## 2012-09-21 ENCOUNTER — Ambulatory Visit (INDEPENDENT_AMBULATORY_CARE_PROVIDER_SITE_OTHER)
Admission: RE | Admit: 2012-09-21 | Discharge: 2012-09-21 | Disposition: A | Discharge status: Home / Self Care | Payer: Medicare Other | Source: Ambulatory Visit | Attending: Cardiovascular Disease | Admitting: Cardiovascular Disease

## 2012-09-21 DIAGNOSIS — R911 Solitary pulmonary nodule: Secondary | ICD-10-CM

## 2012-09-21 DIAGNOSIS — R0602 Shortness of breath: Secondary | ICD-10-CM

## 2012-09-26 ENCOUNTER — Other Ambulatory Visit: Payer: Self-pay

## 2012-09-26 ENCOUNTER — Other Ambulatory Visit: Payer: Self-pay | Admitting: Medical Oncology

## 2012-09-26 ENCOUNTER — Telehealth: Payer: Self-pay | Admitting: Medical Oncology

## 2012-09-26 DIAGNOSIS — R9389 Abnormal findings on diagnostic imaging of other specified body structures: Secondary | ICD-10-CM

## 2012-09-28 NOTE — Telephone Encounter (Signed)
I left message for Dr Randolm Idol nurse to call me back.

## 2012-09-28 NOTE — Telephone Encounter (Signed)
I gave DrRandolm Idol nurse the information she needed to schedule radiology test

## 2012-09-30 ENCOUNTER — Encounter (HOSPITAL_COMMUNITY): Admission: RE | Admit: 2012-09-30 | Payer: Medicare Other | Source: Ambulatory Visit

## 2012-10-06 ENCOUNTER — Encounter (HOSPITAL_COMMUNITY)
Admission: RE | Admit: 2012-10-06 | Discharge: 2012-10-06 | Disposition: A | Discharge status: Home / Self Care | Payer: Medicare Other | Source: Ambulatory Visit | Attending: Cardiovascular Disease | Admitting: Cardiovascular Disease

## 2012-10-06 ENCOUNTER — Encounter (HOSPITAL_COMMUNITY): Payer: Self-pay

## 2012-10-06 ENCOUNTER — Institutional Professional Consult (permissible substitution): Payer: Medicare Other | Admitting: Pulmonary Disease

## 2012-10-06 DIAGNOSIS — C3490 Malignant neoplasm of unspecified part of unspecified bronchus or lung: Secondary | ICD-10-CM | POA: Insufficient documentation

## 2012-10-06 DIAGNOSIS — J984 Other disorders of lung: Secondary | ICD-10-CM | POA: Insufficient documentation

## 2012-10-06 DIAGNOSIS — I700 Atherosclerosis of aorta: Secondary | ICD-10-CM | POA: Insufficient documentation

## 2012-10-06 DIAGNOSIS — I517 Cardiomegaly: Secondary | ICD-10-CM | POA: Insufficient documentation

## 2012-10-06 DIAGNOSIS — Z90710 Acquired absence of both cervix and uterus: Secondary | ICD-10-CM | POA: Insufficient documentation

## 2012-10-06 DIAGNOSIS — R911 Solitary pulmonary nodule: Secondary | ICD-10-CM | POA: Insufficient documentation

## 2012-10-06 DIAGNOSIS — R9389 Abnormal findings on diagnostic imaging of other specified body structures: Secondary | ICD-10-CM

## 2012-10-06 MED ORDER — FLUDEOXYGLUCOSE F - 18 (FDG) INJECTION
18.7000 | Freq: Once | INTRAVENOUS | Status: AC | PRN
  Administered 2012-10-06: 18.7 via INTRAVENOUS

## 2012-10-11 ENCOUNTER — Institutional Professional Consult (permissible substitution): Payer: Medicare Other | Admitting: Pulmonary Disease

## 2012-10-11 ENCOUNTER — Ambulatory Visit: Payer: Medicare Other | Admitting: Cardiovascular Disease

## 2012-10-26 ENCOUNTER — Encounter: Payer: Self-pay | Admitting: Emergency Medicine

## 2012-10-26 ENCOUNTER — Ambulatory Visit (INDEPENDENT_AMBULATORY_CARE_PROVIDER_SITE_OTHER): Payer: Medicare Other | Admitting: Emergency Medicine

## 2012-10-26 VITALS — BP 116/64 | HR 67 | Temp 96.7°F | Ht 67.0 in | Wt 147.6 lb

## 2012-10-26 DIAGNOSIS — C3490 Malignant neoplasm of unspecified part of unspecified bronchus or lung: Secondary | ICD-10-CM

## 2012-10-26 DIAGNOSIS — J441 Chronic obstructive pulmonary disease with (acute) exacerbation: Secondary | ICD-10-CM | POA: Insufficient documentation

## 2012-10-26 DIAGNOSIS — J449 Chronic obstructive pulmonary disease, unspecified: Secondary | ICD-10-CM

## 2012-10-26 DIAGNOSIS — R918 Other nonspecific abnormal finding of lung field: Secondary | ICD-10-CM | POA: Insufficient documentation

## 2012-10-26 DIAGNOSIS — R911 Solitary pulmonary nodule: Secondary | ICD-10-CM

## 2012-10-26 MED ORDER — TIOTROPIUM BROMIDE MONOHYDRATE 18 MCG IN CAPS: 18.0000 ug | ORAL_CAPSULE | Freq: Every day | RESPIRATORY_TRACT | Status: DC

## 2012-10-26 NOTE — Patient Instructions (Addendum)
Please stop Symbicort for now Start Spiriva once a day until next visit We will need to repeat your CT scan of the chest in 6 months. Either Dr Delton Coombes or Dr Arbutus Ped will arrange and follow this.  Walking oximetry today showed that you do not need to wear oxygen with exertion Follow with Dr Delton Coombes in 6 weeks or sooner if you have any problems

## 2012-10-26 NOTE — Progress Notes (Signed)
Subjective:    Patient ID: [name], female    DOB: [birth year], 72 y.o.   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 She has been having dyspnea for > 1 year, some cough that is productive of white mucous. She has hoarse voice frequently, not necessarily related to cough. Not clear that it relates in time to symbicort.   Part of her eval has included CXR that showed a 1.4x0.6cm  L apical nodule, confirmed by CT scan 09/28/12. PET on 10/07/12 showed the nodule was cold.   She is on Symbicort qam only for over a year, feels that it helps her.   PFT 09/02/12 >> moderately severe AFL without BD response, normal volumes, decreased diffusion.    Review of Systems  Constitutional: Negative for fever and unexpected weight change.  HENT: Negative for ear pain, nosebleeds, congestion, sore throat, rhinorrhea, sneezing, trouble swallowing, dental problem, postnasal drip and sinus pressure.   Eyes: Negative for redness and itching.  Respiratory: Positive for shortness of breath. Negative for cough, chest tightness and wheezing.   Cardiovascular: Negative for palpitations and leg swelling.  Gastrointestinal: Negative for nausea and vomiting.  Genitourinary: Negative for dysuria.  Musculoskeletal: Negative for joint swelling.  Skin: Negative for rash.  Neurological: Negative for headaches.  Hematological: Does not bruise/bleed easily.  Psychiatric/Behavioral: Negative for dysphoric mood. The patient is not nervous/anxious.    Past Medical History  Diagnosis Date  . HTN (hypertension)   . Arthritis   . Macular degeneration   . Myocardial infarction     Acute myocardial infarction 2010 - treated with BMS of LCx. LVEF 50%, with subsequent CHF  . Lung cancer dx'd 07/2006    Lung CA, s/p resection, followed by Dr Shirline Frees  . Stroke      Family History  Problem  Relation Age of Onset  . Coronary artery disease Mother   . Hypertension Mother   . Breast cancer Sister   . CAD Father   . Heart attack Mother   . Stroke Mother   . Stroke Father      History   Social History  . Marital Status: Widowed    Spouse Name: N/A    Number of Children: 1  . Years of Education: N/A   Occupational History  . retired     Community education officer   Social History Main Topics  . Smoking status: Former Smoker -- 2.00 packs/day for 40 years    Types: Cigarettes    Quit date: 07/27/2006  . Smokeless tobacco: Never Used  . Alcohol Use: No  . Drug Use: Not on file  . Sexually Active: Not on file   Other Topics Concern  . Not on file   Social History Narrative   The patient is a widow and has one son.  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  She does    not drink alcohol.           No Known Allergies   Outpatient Prescriptions Prior to Visit  Medication Sig Dispense Refill  . aspirin 325 MG tablet Take 1 tablet (325 mg total) by mouth daily.      . calcium carbonate (OS-CAL - DOSED IN MG OF ELEMENTAL CALCIUM) 1250 MG tablet Take 1 tablet by mouth daily.      Marland Kitchen  carvedilol (COREG) 3.125 MG tablet Take 1 tablet (3.125 mg total) by mouth 2 (two) times daily with a meal.  60 tablet  3  . famotidine (PEPCID) 40 MG tablet Take 1 tablet (40 mg total) by mouth daily.  30 tablet  11  . furosemide (LASIX) 40 MG tablet Take 1 tablet (40 mg total) by mouth daily.  30 tablet  6  . losartan (COZAAR) 50 MG tablet Take 1 tablet (50 mg total) by mouth daily.  30 tablet  6  . nitroGLYCERIN (NITROSTAT) 0.4 MG SL tablet Place 1 tablet (0.4 mg total) under the tongue every 5 (five) minutes as needed for chest pain.  25 tablet  3  . simvastatin (ZOCOR) 40 MG tablet Take 1 tablet (40 mg total) by mouth at bedtime.  30 tablet  9   No facility-administered medications prior to visit.         Objective:   Physical Exam Filed Vitals:    10/26/12 1628  BP: 116/64  Pulse: 67  Temp: 96.7 F (35.9 C)   Gen: Pleasant, well-nourished, in no distress,  normal affect  ENT: No lesions,  mouth clear,  oropharynx clear, no postnasal drip, hoarse voice  Neck: No JVD, no TMG, no carotid bruits  Lungs: No use of accessory muscles, very distant, clear without rales or rhonchi  Cardiovascular: RRR, heart sounds normal, no murmur or gallops, no peripheral edema  Musculoskeletal: No deformities, no cyanosis or clubbing  Neuro: alert, non focal  Skin: Warm, no lesions or rashes   09/28/12 --  Comparison: Chest CT 04/18/2012.  Findings:  Mediastinum: Heart size is normal. There is no significant  pericardial fluid, thickening or pericardial calcification. There  is atherosclerosis of the thoracic aorta, the great vessels of the  mediastinum and the coronary arteries, including calcified  atherosclerotic plaque in the left anterior descending, circumflex  and right coronary arteries. Probable coronary artery stent to the  proximal left circumflex coronary arteryNo pathologically enlarged  mediastinal or hilar lymph nodes. Please note that accurate  exclusion of hilar adenopathy is limited on noncontrast CT scans.  There is a small hiatal hernia.  Lungs/Pleura: In the apex of the left hemithorax there is a 1.4 x  0.6 cm pleural based nodular density corresponding to the perceived  radiographic abnormality on the recent plain film examination.  Mild bilateral apical pleuroparenchymal scarring is also noted.  There is a background of moderate centrilobular and paraseptal  emphysema. Postoperative changes of wedge resection are noted in  the medial aspect of the right lower lobe. A 6 mm focus of ground-  glass attenuation in the periphery of the right upper lobe (image  21 of series 3) is similar to the prior examination. 3 mm nodule  in the periphery of the right lower lobe (image 51 of series 3) is  highly nonspecific. No acute  consolidative airspace disease. No  pleural effusions.  Upper Abdomen: Unusual appearance of the gallbladder which is  located between segment four of the liver and the overlying chest  wall. This is similar to prior studies.  Musculoskeletal: There are no aggressive appearing lytic or blastic  lesions noted in the visualized portions of the skeleton. New  healing nondisplaced fracture of the anterolateral aspect of the  left fifth rib.  IMPRESSION:  1. 1.4 x 0.6 cm nodular density in the apex of the left upper lobe  corresponds to the nodule noted on the recent chest radiograph.  While an area of nodular  pleuroparenchymal thickening has been  present in this region on prior chest CT examinations, this is  clearly more prominent than prior studies and warrants further  evaluation with PET CT to exclude underlying malignancy.  2. Additional small pulmonary nodules and nonspecific ground glass  attenuation areas in the lungs bilaterally, as above. Attention on  any future follow up studies is recommended.  3. Mild diffuse bronchial wall thickening with moderate  centrilobular paraseptal emphysema, and postoperative changes of  wedge resection of the right lower lobe.  4. Atherosclerosis, including three-vessel coronary artery disease.  Assessment for potential risk factor modification, dietary therapy  or pharmacologic therapy may be warranted, if clinically indicated.  5. Small hiatal hernia.    PET scan 10/07/12 --  Comparison: CT chest dated 09/21/2012 and 10/09 23,013. PET CT  dated 05/23/2009.  Findings:  Neck: No hypermetabolic lymph nodes in the neck.  Old right parietal infarct (series 2/image 2).  Chest: Nodular opacity at the medial left lung apex (series  2/image 61), unchanged from 04/18/2012. This is non-FDG-avid and  is favored to reflect apical scarring.  Additional pleural parenchymal scarring at the right lung apex.  Status post right lower lobe wedge resection.  Moderate  centrilobular and paraseptal emphysematous changes.  6 mm ground-glass nodule in the right upper lobe (series 2/image  83), similar to 05/23/2009. While non-FDG-avid, this is below the  size threshold for PET sensitivity.  No hypermetabolic mediastinal or hilar nodes. Cardiomegaly.  Coronary atherosclerosis. Atherosclerotic calcifications of the  aortic arch.  Abdomen/Pelvis: No abnormal hypermetabolic activity within the  liver, pancreas, adrenal glands, or spleen.  Atherosclerotic calcifications of the abdominal aorta and branch  vessels. Status post hysterectomy.  No hypermetabolic lymph nodes in the abdomen or pelvis.  Skeleton: No focal hypermetabolic activity to suggest skeletal  metastasis.  Healing/healed left anterolateral fifth rib fracture (series  2/image 116).  IMPRESSION:  Nodular opacity at the medial left lung apex is non-FDG-avid and is  favored to reflect apical scarring.  Status post right lower lobe wedge resection.  No evidence of metastatic disease       Assessment & Plan:  Malignant neoplasm of bronchus and lung, unspecified site S/p R super segmentectomy. This will contribute to restrictive disease, dyspnea.   COPD (chronic obstructive pulmonary disease) Suspect this is a big part of her progressive dyspnea. Unclear whether UA disease is a component of this - she has recurrent episodes of hoarse voice. Currently only on Symbicort once a day. Cleda Daub shows FEV1 65%, moderately severe AFL.  - stop symbicort for now, may add back and increase later - start spiriva qd - walking oximetry today - rov 6 weeks.   Pulmonary nodule, left New pulm nodule noted LUL, PET negative 10/07/12. Will need to be followed w serial CT scans for interval change.  - next scan in 6 months, either w me or Dr Arbutus Ped

## 2012-10-26 NOTE — Assessment & Plan Note (Signed)
New pulm nodule noted LUL, PET negative 10/07/12. Will need to be followed w serial CT scans for interval change.  - next scan in 6 months, either w me or Dr Arbutus Ped

## 2012-10-26 NOTE — Assessment & Plan Note (Addendum)
S/p R super segmentectomy. This will contribute to restrictive disease, dyspnea.

## 2012-10-26 NOTE — Assessment & Plan Note (Signed)
Suspect this is a big part of her progressive dyspnea. Unclear whether UA disease is a component of this - she has recurrent episodes of hoarse voice. Currently only on Symbicort once a day. Cleda Daub shows FEV1 65%, moderately severe AFL.  - stop symbicort for now, may add back and increase later - start spiriva qd - walking oximetry today - rov 6 weeks.

## 2012-11-07 ENCOUNTER — Encounter: Payer: Self-pay | Admitting: Cardiovascular Disease

## 2012-11-07 ENCOUNTER — Ambulatory Visit (INDEPENDENT_AMBULATORY_CARE_PROVIDER_SITE_OTHER): Payer: Medicare Other | Admitting: Cardiovascular Disease

## 2012-11-07 VITALS — BP 146/66 | HR 50 | Ht 67.0 in | Wt 147.0 lb

## 2012-11-07 DIAGNOSIS — R079 Chest pain, unspecified: Secondary | ICD-10-CM

## 2012-11-07 DIAGNOSIS — I5042 Chronic combined systolic (congestive) and diastolic (congestive) heart failure: Secondary | ICD-10-CM

## 2012-11-07 DIAGNOSIS — I2510 Atherosclerotic heart disease of native coronary artery without angina pectoris: Secondary | ICD-10-CM

## 2012-11-07 MED ORDER — ISOSORBIDE MONONITRATE ER 30 MG PO TB24: 15.0000 mg | ORAL_TABLET | Freq: Every day | ORAL | Status: DC

## 2012-11-07 NOTE — Patient Instructions (Addendum)
Your physician has recommended you make the following change in your medication: START Isosorbide MN 30mg  take one-half tablet by mouth daily  Your physician wants you to follow-up in: 6 MONTHS with Dr Excell Seltzer. You will receive a reminder letter in the mail two months in advance. If you don't receive a letter, please call our office to schedule the follow-up appointment.

## 2012-11-07 NOTE — Progress Notes (Signed)
HPI:  72 year old woman presenting for followup evaluation. The patient has coronary artery disease and initially presented with a myocardial infarction in 2010. Her left circumflex was occluded at that time she underwent primary PCI with a bare-metal stent. She's had significant LV dysfunction related to akinesis of the posterolateral myocardium. Her left ventricular ejection fraction has been allover the board on all of echocardiography. Her most recent echo estimated an LVEF of 45%. She was hospitalized in December 2013 with a right MCA stroke. There were multiple areas of infarction and also evidence of a previous hemorrhagic stroke. She also has a history of non-small cell lung cancer and right lower lobe segmental resection with chronic dyspnea. She has COPD and is followed by Dr. Delton Coombes.  She had an episode of shortness of breath over the weekend. She was out cleaning up branches in her yard the day before and feels like this may have been related. She took 2 nitroglycerin and after about 30 minutes her symptoms resolved. She did not have any clear symptoms of chest pain or tightness but felt like she couldn't catch her breath. She denies orthopnea, PND, or leg swelling. She's been worried about her risk of a second stroke in 1 stenosed there is anything else he can be done for prevention.  Outpatient Encounter Prescriptions as of 11/07/2012  Medication Sig Dispense Refill  . aspirin 325 MG tablet Take 1 tablet (325 mg total) by mouth daily.      . calcium carbonate (OS-CAL - DOSED IN MG OF ELEMENTAL CALCIUM) 1250 MG tablet Take 1 tablet by mouth daily.      . carvedilol (COREG) 3.125 MG tablet Take 1 tablet (3.125 mg total) by mouth 2 (two) times daily with a meal.  60 tablet  3  . famotidine (PEPCID) 40 MG tablet Take 1 tablet (40 mg total) by mouth daily.  30 tablet  11  . furosemide (LASIX) 40 MG tablet Take 1 tablet (40 mg total) by mouth daily.  30 tablet  6  . losartan (COZAAR) 50 MG  tablet Take 1 tablet (50 mg total) by mouth daily.  30 tablet  6  . nitroGLYCERIN (NITROSTAT) 0.4 MG SL tablet Place 1 tablet (0.4 mg total) under the tongue every 5 (five) minutes as needed for chest pain.  25 tablet  3  . simvastatin (ZOCOR) 40 MG tablet Take 1 tablet (40 mg total) by mouth at bedtime.  30 tablet  9  . tiotropium (SPIRIVA) 18 MCG inhalation capsule Place 1 capsule (18 mcg total) into inhaler and inhale daily.  30 capsule  11   No facility-administered encounter medications on file as of 11/07/2012.    No Known Allergies  Past Medical History  Diagnosis Date  . HTN (hypertension)   . Arthritis   . Macular degeneration   . Myocardial infarction     Acute myocardial infarction 2010 - treated with BMS of LCx. LVEF 50%, with subsequent CHF  . Lung cancer dx'd 07/2006    Lung CA, s/p resection, followed by Dr Shirline Frees  . Stroke     ROS: Negative except as per HPI  Ht 5\' 7"  (1.702 m)  Wt 66.679 kg (147 lb)  BMI 23.02 kg/m2  PHYSICAL EXAM: Pt is alert and oriented, NAD HEENT: normal Neck: JVP - normal, carotids 2+= without bruits Lungs: CTA bilaterally CV: RRR without murmur or gallop Abd: soft, NT, Positive BS, no hepatomegaly Ext: Trace Ankle edema bilaterally, distal pulses intact and equal Skin: warm/dry  no rash  Echo: Left ventricle: LVEF is approximately 45 to 50%with akinesis of the posterior wall (base, mid); mild hypokinesis of the lateral wall and basal inferior wall. The cavity size was mildly dilated.  ------------------------------------------------------------ Aortic valve: Mildly thickened, mildly calcified leaflets. Doppler: No significant regurgitation.  ------------------------------------------------------------ Mitral valve: Calcified annulus. Mildly thickened leaflets . Doppler: Mild regurgitation. Peak gradient: 2mm Hg (D).  ------------------------------------------------------------ Left atrium: The atrium was normal in  size.  ------------------------------------------------------------ Pulmonic valve: Structurally normal valve. Cusp separation was normal. Doppler: Transvalvular velocity was within the normal range. No regurgitation.  ------------------------------------------------------------ Tricuspid valve: Structurally normal valve. Leaflet separation was normal. Doppler: Transvalvular velocity was within the normal range. Mild regurgitation.  ------------------------------------------------------------ Right atrium: The atrium was normal in size.  ------------------------------------------------------------ Systemic veins: Inferior vena cava: The vessel was mildly dilated; the respirophasic diameter changes were in the normal range (= 50%); findings are consistent with normal central venous pressure. Diameter: 20mm.  ------------------------------------------------------------  2D measurements Normal Doppler measurements Normal IVC Left ventricle Diam 20 mm ------ Ea, med ann, 12. cm/s ------ Left ventricle tiss DP 8 LVID ED, 53.7 mm 43-52 E/Ea, med 6.0 ------ chord, ann, tiss DP 2 PLAX Mitral valve LVID ES, 42 mm 23-38 Peak E vel 77 cm/s ------ chord, Peak A vel 56. cm/s ------ PLAX 8 FS, chord, 22 % >29 Deceleration 151 ms 150-23 PLAX time 0 LVPW, ED 12.1 mm ------ Peak 2 mm ------ IVS/LVPW 1 <1.3 gradient, D Hg ratio, ED Peak E/A 1.4 ------ Ventricular septum ratio IVS, ED 12.1 mm ------ Tricuspid valve IVS, ES 7.19 mm ------ Regurg peak 273 cm/s ------ Septal 41 % ------ vel thickening Peak RV-RA 30 mm ------ Aorta gradient, S Hg Root diam, 32 mm ------ ED Left atrium AP dim 39 mm ------ AP dim 2.27 cm/m^2 <2.2 index  ASSESSMENT AND PLAN: #1. Chronic mixed systolic and diastolic heart failure. She will continue her current medical program with the addition of isosorbide 15 mg daily. We discussed potential side effects of Imdur and she understands. She should remain on  furosemide, carvedilol, and losartan.  #2. Coronary artery disease, native vessel. Addition of isosorbide as above. If she has recurrent dyspnea or chest discomfort she will notify us. She remains on aspirin 81 mg.  #3. Hyperlipidemia. Continue simvastatin. Lipids with an LDL of 69, triglycerides 77, total cholesterol 182. HDL was 77.  #4. Hypertension. Blood pressure is controlled on current program.  Tonny Bollman 11/07/2012 9:50 AM

## 2012-12-07 ENCOUNTER — Ambulatory Visit (INDEPENDENT_AMBULATORY_CARE_PROVIDER_SITE_OTHER): Payer: Medicare Other | Admitting: Emergency Medicine

## 2012-12-07 ENCOUNTER — Encounter: Payer: Self-pay | Admitting: Emergency Medicine

## 2012-12-07 VITALS — BP 110/70 | HR 58 | Temp 96.7°F | Ht 67.0 in | Wt 148.8 lb

## 2012-12-07 DIAGNOSIS — R059 Cough, unspecified: Secondary | ICD-10-CM

## 2012-12-07 DIAGNOSIS — R911 Solitary pulmonary nodule: Secondary | ICD-10-CM

## 2012-12-07 DIAGNOSIS — J449 Chronic obstructive pulmonary disease, unspecified: Secondary | ICD-10-CM

## 2012-12-07 DIAGNOSIS — R05 Cough: Secondary | ICD-10-CM | POA: Insufficient documentation

## 2012-12-07 DIAGNOSIS — J4489 Other specified chronic obstructive pulmonary disease: Secondary | ICD-10-CM

## 2012-12-07 NOTE — Assessment & Plan Note (Signed)
PET negative in 3/'14. Planning for repeat CT scan in 9/'14

## 2012-12-07 NOTE — Assessment & Plan Note (Signed)
Stable - not clear that she has benefited from the Spiriva. I believe we should continue one class of BD's. She may need to add back ICS/LABA at some point.  - spiriva qd - SABA prn

## 2012-12-07 NOTE — Assessment & Plan Note (Signed)
-   trial nasal steroid. If she benefits then will order fluticasone

## 2012-12-07 NOTE — Patient Instructions (Addendum)
Please continue your Spiriva daily Use albuterol 2 puffs up to every 4 hours if needed for shortness of breath Start Nasonex 2 sprays each nostril daily. If this medication helps your cough then call us so we can order a generic nasal spray from your pharmacy.  Follow with Dr Delton Coombes in 4 months or sooner if you have any problems.

## 2012-12-07 NOTE — Progress Notes (Signed)
Subjective:    Patient ID: [name], female    DOB: March 31, 1941, 72 y.o.   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 She has been having dyspnea for > 1 year, some cough that is productive of white mucous. She has hoarse voice frequently, not necessarily related to cough. Not clear that it relates in time to symbicort.   Part of her eval has included CXR that showed a 1.4x0.6cm  L apical nodule, confirmed by CT scan 09/28/12. PET on 10/07/12 showed the nodule was cold.   She is on Symbicort qam only for over a year, feels that it helps her.   PFT 09/02/12 >> moderately severe AFL without BD response, normal volumes, decreased diffusion.   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 PET negative 1.4x0.6cm  L apical nodule 10/07/12. We stopped symbicort, started spiriva last time.  She doesn't think the SOB has changed any since starting. She is still having some mucous but better. Still has cough, esp in the am. She doesn't have a SABA. She is due for CT scan chest w Dr Arbutus Ped in 9/'14. Not on nasal steroid or anti-histamine.    Review of Systems  Constitutional: Negative for fever and unexpected weight change.  HENT: Negative for ear pain, nosebleeds, congestion, sore throat, rhinorrhea, sneezing, trouble swallowing, dental problem, postnasal drip and sinus pressure.   Eyes: Negative for redness and itching.  Respiratory: Positive for shortness of breath. Negative for cough, chest tightness and wheezing.   Cardiovascular: Negative for palpitations and leg swelling.  Gastrointestinal: Negative for nausea and vomiting.  Genitourinary: Negative for dysuria.  Musculoskeletal: Negative for joint swelling.  Skin: Negative for rash.  Neurological: Negative for headaches.   Hematological: Does not bruise/bleed easily.  Psychiatric/Behavioral: Negative for dysphoric mood. The patient is not nervous/anxious.       Objective:   Physical Exam Filed Vitals:   12/07/12 1342  BP: 110/70  Pulse: 58  Temp: 96.7 F (35.9 C)   Gen: Pleasant, well-nourished, in no distress,  normal affect  ENT: No lesions,  mouth clear,  oropharynx clear, no postnasal drip, hoarse voice  Neck: No JVD, no TMG, no carotid bruits  Lungs: No use of accessory muscles, very distant, clear without rales or rhonchi  Cardiovascular: RRR, heart sounds normal, no murmur or gallops, no peripheral edema  Musculoskeletal: No deformities, no cyanosis or clubbing  Neuro: alert, non focal  Skin: Warm, no lesions or rashes   09/28/12 --  Comparison: Chest CT 04/18/2012.  Findings:  Mediastinum: Heart size is normal. There is no significant  pericardial fluid, thickening or pericardial calcification. There  is atherosclerosis of the thoracic aorta, the great vessels of the  mediastinum and the coronary arteries, including calcified  atherosclerotic plaque in the left anterior descending, circumflex  and right coronary arteries. Probable coronary artery stent to the  proximal left circumflex coronary arteryNo pathologically enlarged  mediastinal or hilar lymph nodes. Please note that accurate  exclusion of hilar adenopathy is limited on noncontrast CT scans.  There is a small hiatal hernia.  Lungs/Pleura: In the apex of the left hemithorax there is a 1.4 x  0.6 cm pleural based nodular density corresponding to the perceived  radiographic abnormality on the recent plain film examination.  Mild bilateral  apical pleuroparenchymal scarring is also noted.  There is a background of moderate centrilobular and paraseptal  emphysema. Postoperative changes of wedge resection are noted in  the medial aspect of the right lower lobe. A 6 mm focus of ground-  glass attenuation in the periphery of  the right upper lobe (image  21 of series 3) is similar to the prior examination. 3 mm nodule  in the periphery of the right lower lobe (image 51 of series 3) is  highly nonspecific. No acute consolidative airspace disease. No  pleural effusions.  Upper Abdomen: Unusual appearance of the gallbladder which is  located between segment four of the liver and the overlying chest  wall. This is similar to prior studies.  Musculoskeletal: There are no aggressive appearing lytic or blastic  lesions noted in the visualized portions of the skeleton. New  healing nondisplaced fracture of the anterolateral aspect of the  left fifth rib.  IMPRESSION:  1. 1.4 x 0.6 cm nodular density in the apex of the left upper lobe  corresponds to the nodule noted on the recent chest radiograph.  While an area of nodular pleuroparenchymal thickening has been  present in this region on prior chest CT examinations, this is  clearly more prominent than prior studies and warrants further  evaluation with PET CT to exclude underlying malignancy.  2. Additional small pulmonary nodules and nonspecific ground glass  attenuation areas in the lungs bilaterally, as above. Attention on  any future follow up studies is recommended.  3. Mild diffuse bronchial wall thickening with moderate  centrilobular paraseptal emphysema, and postoperative changes of  wedge resection of the right lower lobe.  4. Atherosclerosis, including three-vessel coronary artery disease.  Assessment for potential risk factor modification, dietary therapy  or pharmacologic therapy may be warranted, if clinically indicated.  5. Small hiatal hernia.    PET scan 10/07/12 --  Comparison: CT chest dated 09/21/2012 and 10/09 23,013. PET CT  dated 05/23/2009.  Findings:  Neck: No hypermetabolic lymph nodes in the neck.  Old right parietal infarct (series 2/image 2).  Chest: Nodular opacity at the medial left lung apex (series  2/image 61), unchanged  from 04/18/2012. This is non-FDG-avid and  is favored to reflect apical scarring.  Additional pleural parenchymal scarring at the right lung apex.  Status post right lower lobe wedge resection. Moderate  centrilobular and paraseptal emphysematous changes.  6 mm ground-glass nodule in the right upper lobe (series 2/image  83), similar to 05/23/2009. While non-FDG-avid, this is below the  size threshold for PET sensitivity.  No hypermetabolic mediastinal or hilar nodes. Cardiomegaly.  Coronary atherosclerosis. Atherosclerotic calcifications of the  aortic arch.  Abdomen/Pelvis: No abnormal hypermetabolic activity within the  liver, pancreas, adrenal glands, or spleen.  Atherosclerotic calcifications of the abdominal aorta and branch  vessels. Status post hysterectomy.  No hypermetabolic lymph nodes in the abdomen or pelvis.  Skeleton: No focal hypermetabolic activity to suggest skeletal  metastasis.  Healing/healed left anterolateral fifth rib fracture (series  2/image 116).  IMPRESSION:  Nodular opacity at the medial left lung apex is non-FDG-avid and is  favored to reflect apical scarring.  Status post right lower lobe wedge resection.  No evidence of metastatic disease       Assessment & Plan:  COPD (chronic obstructive pulmonary disease) Stable - not clear that she has benefited from the Spiriva. I believe we should continue one class of BD's. She may need to add back ICS/LABA at some point.  -  spiriva qd - SABA prn   Pulmonary nodule, left PET negative in 3/'14. Planning for repeat CT scan in 9/'14  Cough - trial nasal steroid. If she benefits then will order fluticasone

## 2012-12-14 ENCOUNTER — Other Ambulatory Visit: Payer: Self-pay

## 2012-12-14 MED ORDER — CARVEDILOL 3.125 MG PO TABS: 3.1250 mg | ORAL_TABLET | Freq: Two times a day (BID) | ORAL | Status: DC

## 2012-12-15 ENCOUNTER — Telehealth: Payer: Self-pay | Admitting: *Deleted

## 2012-12-16 ENCOUNTER — Telehealth: Payer: Self-pay | Admitting: *Deleted

## 2012-12-16 ENCOUNTER — Telehealth: Payer: Self-pay | Admitting: Nurse Practitioner

## 2012-12-16 NOTE — Telephone Encounter (Signed)
Patient returning call from yesterday. She had forgot to leave the medication she needed on the refill voicemail. Nin a called her back and was unable to reach her. She needs carvedilol 3.125mg  twice daily. I let her know that her nurse, Leotis Shames, has sent the rx in on the 21 of May in the afternoon. She said thank you and will call them to make sure they have it before she goes up there just in case.   Micki Riley, CMA

## 2012-12-16 NOTE — Telephone Encounter (Signed)
Previous refill encounter.

## 2012-12-21 ENCOUNTER — Telehealth: Payer: Self-pay | Admitting: Emergency Medicine

## 2012-12-21 MED ORDER — MOMETASONE FUROATE 50 MCG/ACT NA SUSP: 2.0000 | Freq: Every day | NASAL | Status: DC

## 2012-12-21 NOTE — Telephone Encounter (Signed)
Spoke with patient, patient states the Nasonex and albuterol both helped. Patient would like an Rx of these medications-- will send in generic Nasonex and stated at last OV Patient will call when ready for albuterol rx. Nothing further needed at this time.

## 2012-12-21 NOTE — Telephone Encounter (Signed)
Pt returned call. [phone]. [name]

## 2012-12-21 NOTE — Telephone Encounter (Signed)
LMTCB

## 2013-01-04 ENCOUNTER — Other Ambulatory Visit: Payer: Self-pay

## 2013-01-04 DIAGNOSIS — K219 Gastro-esophageal reflux disease without esophagitis: Secondary | ICD-10-CM

## 2013-01-04 MED ORDER — FAMOTIDINE 40 MG PO TABS: 40.0000 mg | ORAL_TABLET | Freq: Every day | ORAL | Status: DC

## 2013-01-09 ENCOUNTER — Telehealth: Payer: Self-pay | Admitting: Emergency Medicine

## 2013-01-09 NOTE — Telephone Encounter (Signed)
I spoke with pt. She stated the nasonex is to expensive for her. She is going to check and see how much fluticasone/nascort and will call us back and let us know. Will await pt call back.

## 2013-01-10 NOTE — Telephone Encounter (Signed)
Flonase will be cheaper through pt insurance than the Nasonex.   Please advise RB that this is okay to switch. Thanks.

## 2013-01-10 NOTE — Telephone Encounter (Signed)
Pt returned call. [name]  

## 2013-01-10 NOTE — Telephone Encounter (Signed)
Called, spoke with pt. She will check on this later today and will let us know.  Will await call back from pt.

## 2013-01-10 NOTE — Telephone Encounter (Signed)
Patient called back---hung up before could be transferred to triage.  ATC x 2 Busy Signal///WCB

## 2013-01-10 NOTE — Telephone Encounter (Signed)
atc line busy

## 2013-01-10 NOTE — Telephone Encounter (Signed)
Definitely ok to switch

## 2013-01-11 MED ORDER — FLUTICASONE PROPIONATE 50 MCG/ACT NA SUSP: 2.0000 | Freq: Every day | NASAL | Status: DC

## 2013-01-11 NOTE — Telephone Encounter (Signed)
Spoke with pt and instructed that Dr Delton Coombes is ok for pt to switch to Flonase.  Rx sent to [facility] pharmacy.

## 2013-02-09 ENCOUNTER — Other Ambulatory Visit: Payer: Self-pay | Admitting: *Deleted

## 2013-02-10 ENCOUNTER — Telehealth: Payer: Self-pay | Admitting: *Deleted

## 2013-02-10 NOTE — Telephone Encounter (Signed)
Office note dated 02/02/13 from Dr Ludwig Clarks given to Dr Donnald Garre to review.

## 2013-02-14 ENCOUNTER — Other Ambulatory Visit: Payer: Self-pay

## 2013-02-14 ENCOUNTER — Other Ambulatory Visit: Payer: Self-pay | Admitting: *Deleted

## 2013-02-14 DIAGNOSIS — Z1231 Encounter for screening mammogram for malignant neoplasm of breast: Secondary | ICD-10-CM

## 2013-02-14 MED ORDER — SIMVASTATIN 40 MG PO TABS: 40.0000 mg | ORAL_TABLET | Freq: Every day | ORAL | Status: DC

## 2013-03-15 ENCOUNTER — Ambulatory Visit
Admission: RE | Admit: 2013-03-15 | Discharge: 2013-03-15 | Disposition: A | Discharge status: Home / Self Care | Payer: Medicare Other | Source: Ambulatory Visit

## 2013-03-15 DIAGNOSIS — Z1231 Encounter for screening mammogram for malignant neoplasm of breast: Secondary | ICD-10-CM

## 2013-03-23 ENCOUNTER — Ambulatory Visit: Payer: Self-pay | Admitting: Nurse Practitioner

## 2013-03-28 ENCOUNTER — Telehealth: Payer: Self-pay | Admitting: Cardiovascular Disease

## 2013-03-28 NOTE — Telephone Encounter (Signed)
I spoke with the pt and made her aware that she can take extra lasix tomorrow and come into the office at 3:15 for appointment with Dr Excell Seltzer.

## 2013-03-28 NOTE — Telephone Encounter (Signed)
I spoke with the pt and she complains of swelling in her lower extremities for the past 2 weeks. The pt said it is mid-calf area and down into her ankles and feet. She does have an area on her foot that is healing due to her foot cracking open from edema.  The pt cannot bend her toes either due to swelling. The pt has tried elevating her legs but this has not helped. The pt does not weigh daily and she avoids salt. Her SOB remains the same and has not worsened.  At this time I instructed the pt to take an extra 40mg  of Lasix and I will forward this message to Dr Excell Seltzer for further recommendations.

## 2013-03-28 NOTE — Telephone Encounter (Signed)
Agreed - would do an additional lasix 40 mg daily and arrange an OV with me or NP/PA with labs (BMET/BNP) at that time. thx

## 2013-03-28 NOTE — Telephone Encounter (Signed)
New Problemm  Pt states her feet has been swelling for over 2 weeks and want to discuss w/ a nurse.

## 2013-03-29 ENCOUNTER — Other Ambulatory Visit: Payer: Self-pay

## 2013-03-29 ENCOUNTER — Encounter: Payer: Self-pay | Admitting: Cardiovascular Disease

## 2013-03-29 ENCOUNTER — Ambulatory Visit (INDEPENDENT_AMBULATORY_CARE_PROVIDER_SITE_OTHER): Payer: Medicare Other | Admitting: Cardiovascular Disease

## 2013-03-29 VITALS — BP 132/78 | HR 60 | Wt 152.8 lb

## 2013-03-29 DIAGNOSIS — R609 Edema, unspecified: Secondary | ICD-10-CM

## 2013-03-29 DIAGNOSIS — I2510 Atherosclerotic heart disease of native coronary artery without angina pectoris: Secondary | ICD-10-CM

## 2013-03-29 DIAGNOSIS — I10 Essential (primary) hypertension: Secondary | ICD-10-CM

## 2013-03-29 DIAGNOSIS — I5023 Acute on chronic systolic (congestive) heart failure: Secondary | ICD-10-CM

## 2013-03-29 MED ORDER — FUROSEMIDE 40 MG PO TABS: 40.0000 mg | ORAL_TABLET | Freq: Two times a day (BID) | ORAL | Status: DC

## 2013-03-29 MED ORDER — FUROSEMIDE 40 MG PO TABS: 40.0000 mg | ORAL_TABLET | Freq: Every day | ORAL | Status: DC

## 2013-03-29 MED ORDER — SPIRONOLACTONE 25 MG PO TABS: 12.5000 mg | ORAL_TABLET | Freq: Every day | ORAL | Status: DC

## 2013-03-29 NOTE — Progress Notes (Signed)
HPI:   72 year old woman presenting for followup evaluation. The patient has coronary artery disease and initially presented with a myocardial infarction in 2010. Her left circumflex was occluded at that time she underwent primary PCI with a bare-metal stent. She's had significant LV dysfunction related to akinesis of the posterolateral myocardium. Her most recent echo estimated an LVEF of 45% in December 2013. She was hospitalized in December 2013 with a right MCA stroke. There were multiple areas of infarction and also evidence of a previous hemorrhagic stroke. She also has a history of non-small cell lung cancer and right lower lobe segmental resection with chronic dyspnea. She has COPD and is followed by Dr. Delton Coombes.   She called in with leg swelling, refractory to an increase in her diuretics. She has chronic dyspnea which is unchanged. Denies orthopnea, PND, or chest pain. She does admit to heart palpitations. Feels fatigued but no other specific complaints.  Outpatient Encounter Prescriptions as of 03/29/2013  Medication Sig Dispense Refill  . albuterol (PROVENTIL HFA;VENTOLIN HFA) 108 (90 BASE) MCG/ACT inhaler Inhale 1 puff into the lungs daily.      Marland Kitchen aspirin 325 MG tablet Take 1 tablet (325 mg total) by mouth daily.      . calcium carbonate (OS-CAL - DOSED IN MG OF ELEMENTAL CALCIUM) 1250 MG tablet Take 2 tablets by mouth daily.       . carvedilol (COREG) 3.125 MG tablet Take 1 tablet (3.125 mg total) by mouth 2 (two) times daily with a meal.  60 tablet  11  . famotidine (PEPCID) 40 MG tablet Take 1 tablet (40 mg total) by mouth daily.  30 tablet  5  . furosemide (LASIX) 40 MG tablet Take 80 mg by mouth daily.      . isosorbide mononitrate (IMDUR) 30 MG 24 hr tablet Take 0.5 tablets (15 mg total) by mouth daily.  90 tablet  3  . losartan (COZAAR) 50 MG tablet Take 1 tablet (50 mg total) by mouth daily.  30 tablet  6  . nitroGLYCERIN (NITROSTAT) 0.4 MG SL tablet Place 1 tablet (0.4 mg total)  under the tongue every 5 (five) minutes as needed for chest pain.  25 tablet  3  . simvastatin (ZOCOR) 40 MG tablet Take 1 tablet (40 mg total) by mouth at bedtime.  30 tablet  1  . tiotropium (SPIRIVA) 18 MCG inhalation capsule Place 1 capsule (18 mcg total) into inhaler and inhale daily.  30 capsule  11  . [DISCONTINUED] furosemide (LASIX) 40 MG tablet Take 1 tablet (40 mg total) by mouth daily.  30 tablet  6  . [DISCONTINUED] fluticasone (FLONASE) 50 MCG/ACT nasal spray Place 2 sprays into the nose daily.  16 g  6   No facility-administered encounter medications on file as of 03/29/2013.    No Known Allergies  Past Medical History  Diagnosis Date  . HTN (hypertension)   . Arthritis   . Macular degeneration   . Myocardial infarction     Acute myocardial infarction 2010 - treated with BMS of LCx. LVEF 50%, with subsequent CHF  . Lung cancer dx'd 07/2006    Lung CA, s/p resection, followed by Dr Shirline Frees  . Stroke     ROS: Negative except as per HPI  BP 132/78  Pulse 60  Wt 152 lb 12 oz (69.287 kg)  BMI 23.92 kg/m2  PHYSICAL EXAM: Pt is alert and oriented, pleasant woman in NAD HEENT: normal Neck: JVP - mildly elevated, carotids 2+= without  bruits Lungs: decreased breath sounds bilaterally CV: RRR without murmur or gallop Abd: soft, NT, Positive BS, no hepatomegaly Ext: 2+ pretibial/ankle edema bilaterally, distal pulses intact and equal  EKG:  NSR 60 bpm, nonspecific ST abnormality, occasional PVC  2D Echo 07/09/2012: Left ventricle: LVEF is approximately 45 to 50%with akinesis of the posterior wall (base, mid); mild hypokinesis of the lateral wall and basal inferior wall. The cavity size was mildly dilated.  ------------------------------------------------------------ Aortic valve: Mildly thickened, mildly calcified leaflets. Doppler: No significant regurgitation.  ------------------------------------------------------------ Mitral valve: Calcified annulus. Mildly  thickened leaflets . Doppler: Mild regurgitation. Peak gradient: 2mm Hg (D).  ------------------------------------------------------------ Left atrium: The atrium was normal in size.  ------------------------------------------------------------ Pulmonic valve: Structurally normal valve. Cusp separation was normal. Doppler: Transvalvular velocity was within the normal range. No regurgitation.  ------------------------------------------------------------ Tricuspid valve: Structurally normal valve. Leaflet separation was normal. Doppler: Transvalvular velocity was within the normal range. Mild regurgitation.  ------------------------------------------------------------ Right atrium: The atrium was normal in size.  ASSESSMENT AND PLAN: 1. Acute on chronic systolic/diastolic heart failure. Primary symptom is leg swelling. Will increase furosemide to 80 mg BID x 2 days, then 40 mg BID. Add aldactone 12.5 mg daily. Check BMET, BNP today and repeat BMET in 2 weeks with addition of aldactone. Otherwise continue same Rx.  2. Edema. Suspect secondary to #1, but will check venous dopplers considering impressive exam findings and hx of malignancy.  3. CAD - no anginal symptoms. Will continue current Rx.  4. Hyperlipidemia - treated with simvastatin.  Tonny Bollman 03/31/2013 5:29 AM

## 2013-03-29 NOTE — Patient Instructions (Addendum)
Your physician has requested that you have a lower extremity venous duplex. This test is an ultrasound of the veins in the legs. It looks at venous blood flow that carries blood from the heart to the legs. Allow one hour for a Lower Venous exam. There are no restrictions or special instructions.  Your physician has recommended you make the following change in your medication: Please take Furosemide 80mg  twice a day for two days and then decrease to Furosemide 40mg  twice a day, START Spironolactone 25mg  take one-half tablet by mouth daily  Your physician recommends that you have lab work today: BMP and BNP  Your physician recommends that you return for lab work in: 2 WEEKS (BMP)  Your physician recommends that you schedule a follow-up appointment in: 4 WEEKS with Dr Excell Seltzer

## 2013-03-30 LAB — BASIC METABOLIC PANEL
Calcium: 9.2 mg/dL (ref 8.4–10.5)
Creatinine, Ser: 0.9 mg/dL (ref 0.4–1.2)
GFR: 62.93 mL/min (ref >60.00)
Sodium: 139 mEq/L (ref 135–145)

## 2013-03-30 LAB — BRAIN NATRIURETIC PEPTIDE: Pro B Natriuretic peptide (BNP): 271 pg/mL — ABNORMAL HIGH (ref 0.0–100.0)

## 2013-04-05 ENCOUNTER — Telehealth: Payer: Self-pay | Admitting: Cardiovascular Disease

## 2013-04-05 NOTE — Telephone Encounter (Signed)
Reviewed lab results and plan of care with patient who verbalized understanding 

## 2013-04-05 NOTE — Telephone Encounter (Signed)
New Problem  Pt returning a call for results.

## 2013-04-06 ENCOUNTER — Encounter (INDEPENDENT_AMBULATORY_CARE_PROVIDER_SITE_OTHER): Payer: Medicare Other

## 2013-04-06 DIAGNOSIS — R609 Edema, unspecified: Secondary | ICD-10-CM

## 2013-04-06 DIAGNOSIS — I2510 Atherosclerotic heart disease of native coronary artery without angina pectoris: Secondary | ICD-10-CM

## 2013-04-06 DIAGNOSIS — I10 Essential (primary) hypertension: Secondary | ICD-10-CM

## 2013-04-10 ENCOUNTER — Other Ambulatory Visit: Payer: Self-pay | Admitting: *Deleted

## 2013-04-10 DIAGNOSIS — I2510 Atherosclerotic heart disease of native coronary artery without angina pectoris: Secondary | ICD-10-CM

## 2013-04-10 DIAGNOSIS — I5023 Acute on chronic systolic (congestive) heart failure: Secondary | ICD-10-CM

## 2013-04-10 MED ORDER — LOSARTAN POTASSIUM 50 MG PO TABS: 50.0000 mg | ORAL_TABLET | Freq: Every day | ORAL | Status: DC

## 2013-04-11 ENCOUNTER — Ambulatory Visit (HOSPITAL_COMMUNITY)
Admission: RE | Admit: 2013-04-11 | Discharge: 2013-04-11 | Disposition: A | Discharge status: Home / Self Care | Payer: Medicare Other | Source: Ambulatory Visit | Attending: Internal Medicine | Admitting: Internal Medicine

## 2013-04-11 ENCOUNTER — Other Ambulatory Visit (HOSPITAL_BASED_OUTPATIENT_CLINIC_OR_DEPARTMENT_OTHER): Payer: Medicare Other | Admitting: Lab

## 2013-04-11 DIAGNOSIS — C3490 Malignant neoplasm of unspecified part of unspecified bronchus or lung: Secondary | ICD-10-CM

## 2013-04-11 DIAGNOSIS — I700 Atherosclerosis of aorta: Secondary | ICD-10-CM | POA: Insufficient documentation

## 2013-04-11 DIAGNOSIS — R911 Solitary pulmonary nodule: Secondary | ICD-10-CM | POA: Insufficient documentation

## 2013-04-11 LAB — COMPREHENSIVE METABOLIC PANEL (CC13)
Albumin: 3.6 g/dL (ref 3.5–5.0)
Alkaline Phosphatase: 120 U/L (ref 40–150)
BUN: 17.2 mg/dL (ref 7.0–26.0)
Calcium: 10.1 mg/dL (ref 8.4–10.4)
Creatinine: 1.1 mg/dL (ref 0.6–1.1)
Glucose: 100 mg/dl (ref 70–140)
Potassium: 4.6 mEq/L (ref 3.5–5.1)

## 2013-04-11 LAB — CBC WITH DIFFERENTIAL/PLATELET
Basophils Absolute: 0.1 10*3/uL (ref 0.0–0.1)
EOS%: 11.8 % — ABNORMAL HIGH (ref 0.0–7.0)
Eosinophils Absolute: 0.7 10*3/uL — ABNORMAL HIGH (ref 0.0–0.5)
HGB: 14.1 g/dL (ref 11.6–15.9)
MCH: 32.8 pg (ref 25.1–34.0)
NEUT#: 2.6 10*3/uL (ref 1.5–6.5)
RBC: 4.31 10*6/uL (ref 3.70–5.45)
RDW: 13.3 % (ref 11.2–14.5)
lymph#: 1.9 10*3/uL (ref 0.9–3.3)

## 2013-04-13 ENCOUNTER — Ambulatory Visit (INDEPENDENT_AMBULATORY_CARE_PROVIDER_SITE_OTHER): Payer: Medicare Other | Admitting: Emergency Medicine

## 2013-04-13 ENCOUNTER — Encounter: Payer: Self-pay | Admitting: Emergency Medicine

## 2013-04-13 VITALS — BP 122/70 | HR 58 | Ht 67.5 in | Wt 152.0 lb

## 2013-04-13 DIAGNOSIS — J449 Chronic obstructive pulmonary disease, unspecified: Secondary | ICD-10-CM

## 2013-04-13 DIAGNOSIS — C3490 Malignant neoplasm of unspecified part of unspecified bronchus or lung: Secondary | ICD-10-CM

## 2013-04-13 NOTE — Progress Notes (Signed)
Subjective:    Patient ID: [name], female    DOB: 04-Aug-1940, 72 y.o.   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 She has been having dyspnea for > 1 year, some cough that is productive of white mucous. She has hoarse voice frequently, not necessarily related to cough. Not clear that it relates in time to symbicort.   Part of her eval has included CXR that showed a 1.4x0.6cm  L apical nodule, confirmed by CT scan 09/28/12. PET on 10/07/12 showed the nodule was cold.   She is on Symbicort qam only for over a year, feels that it helps her.   PFT 09/02/12 >> moderately severe AFL without BD response, normal volumes, decreased diffusion.   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 PET negative 1.4x0.6cm  L apical nodule 10/07/12. We stopped symbicort, started spiriva last time.  She doesn't think the SOB has changed any since starting. She is still having some mucous but better. Still has cough, esp in the am. She doesn't have a SABA. She is due for CT scan chest w Dr Arbutus Ped in 9/'14. Not on nasal steroid or anti-histamine.   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 PET negative 1.4x0.6cm  L apical nodule 10/07/12. Stable by CT scan 04/10/13.  Had been doing well until this week when she got sore throat, dry cough. Using spiriva, albuterol prn > about once a day.  She has flonase at home but hasn't used it yet.   Review of Systems  Constitutional: Negative for fever, chills and unexpected weight change.  HENT: Positive for congestion, rhinorrhea and postnasal drip. Negative for ear pain, nosebleeds, sore throat, sneezing, trouble swallowing, dental problem and sinus pressure.    Eyes: Negative for redness and itching.  Respiratory: Positive for cough. Negative for chest tightness, shortness of breath and wheezing.   Cardiovascular: Negative for palpitations and leg swelling.  Gastrointestinal: Negative for nausea and vomiting.  Genitourinary: Negative for dysuria.  Musculoskeletal: Negative for joint swelling.  Skin: Negative for rash.  Neurological: Negative for headaches.  Hematological: Does not bruise/bleed easily.  Psychiatric/Behavioral: Negative for dysphoric mood. The patient is not nervous/anxious.       Objective:   Physical Exam Filed Vitals:   04/13/13 1335  BP: 122/70  Pulse: 58   Gen: Pleasant, well-nourished, in no distress,  normal affect  ENT: No lesions,  mouth clear,  oropharynx clear, no postnasal drip, hoarse voice  Neck: No JVD, no TMG, no carotid bruits  Lungs: No use of accessory muscles, very distant, clear without rales or rhonchi  Cardiovascular: RRR, heart sounds normal, no murmur or gallops, no peripheral edema  Musculoskeletal: No deformities, no cyanosis or clubbing  Neuro: alert, non focal  Skin: Warm, no lesions or rashes  04/10/13 --  COMPARISON: PET-CT dated 10/06/2012. CT chest dated 09/21/2012.  FINDINGS:  Status post right lower lobectomy.  Stable 14 x 8 mm nodular opacity at the left lung apex (series 5/  image 6), non FDG avid on prior PET, possibly reflecting nodular  scarring.  No new/suspicious pulmonary nodules. Underlying mild to moderate  centrilobular and paraseptal emphysematous changes. No pleural  effusion or pneumothorax.  Visualized thyroid is unremarkable.  The heart is normal in size. No pericardial effusion. Coronary  atherosclerosis. Coronary stent. Atherosclerotic calcifications of  the aortic arch.  Small mediastinal lymph nodes which do not meet pathologic CT size  criteria. No suspicious axillary lymphadenopathy.  Visualized upper abdomen is notable for a small hiatal hernia and   vascular calcifications.  Visualized osseous structures are within normal limits.  IMPRESSION:  Status post right lower lobectomy.  Stable 14 x 8 mm nodular opacity at the left lung apex, non FDG avid  on prior PET, favored to reflect nodular scarring.  No findings specific for recurrent or metastatic disease in the  chest.    PET scan 10/07/12 --  Comparison: CT chest dated 09/21/2012 and 10/09 23,013. PET CT  dated 05/23/2009.  Findings:  Neck: No hypermetabolic lymph nodes in the neck.  Old right parietal infarct (series 2/image 2).  Chest: Nodular opacity at the medial left lung apex (series  2/image 61), unchanged from 04/18/2012. This is non-FDG-avid and  is favored to reflect apical scarring.  Additional pleural parenchymal scarring at the right lung apex.  Status post right lower lobe wedge resection. Moderate  centrilobular and paraseptal emphysematous changes.  6 mm ground-glass nodule in the right upper lobe (series 2/image  83), similar to 05/23/2009. While non-FDG-avid, this is below the  size threshold for PET sensitivity.  No hypermetabolic mediastinal or hilar nodes. Cardiomegaly.  Coronary atherosclerosis. Atherosclerotic calcifications of the  aortic arch.  Abdomen/Pelvis: No abnormal hypermetabolic activity within the  liver, pancreas, adrenal glands, or spleen.  Atherosclerotic calcifications of the abdominal aorta and branch  vessels. Status post hysterectomy.  No hypermetabolic lymph nodes in the abdomen or pelvis.  Skeleton: No focal hypermetabolic activity to suggest skeletal  metastasis.  Healing/healed left anterolateral fifth rib fracture (series  2/image 116).  IMPRESSION:  Nodular opacity at the medial left lung apex is non-FDG-avid and is  favored to reflect apical scarring.  Status post right lower lobe wedge resection.  No evidence of metastatic disease       Assessment & Plan:  Malignant neoplasm of bronchus and lung, unspecified  site L apical nodule is stable by CT scan done 04/10/13. She has f/u w Dr Arbutus Ped next week.   COPD (chronic obstructive pulmonary disease) Appears to have a URI without exacerbation. Reviewed w her the sx of an AE, asked her to call us if symptoms evolve Continue Spiriva + albuterol prn Flu shot this fall > she will get from PCP rov 6

## 2013-04-13 NOTE — Assessment & Plan Note (Addendum)
Appears to have a URI without exacerbation. Reviewed w her the sx of an AE, asked her to call us if symptoms evolve Continue Spiriva + albuterol prn Flu shot this fall > she will get from PCP rov 6

## 2013-04-13 NOTE — Assessment & Plan Note (Signed)
L apical nodule is stable by CT scan done 04/10/13. She has f/u w Dr Arbutus Ped next week.

## 2013-04-13 NOTE — Patient Instructions (Addendum)
Please contineu your spiriva daily Use albuterol as needed Restart your fluticasone nasal spray, 2 sprays each side 1-2 times a day.  Get the Flu shot this Fall.  Follow with Dr Delton Coombes in 6 months or sooner if you have any problems

## 2013-04-14 ENCOUNTER — Telehealth: Payer: Self-pay | Admitting: Internal Medicine

## 2013-04-14 NOTE — Telephone Encounter (Signed)
Called pt and left message appt moved to 05/02/13 from 9/23 per MD

## 2013-04-18 ENCOUNTER — Other Ambulatory Visit: Payer: Medicare Other | Admitting: Lab

## 2013-04-18 ENCOUNTER — Ambulatory Visit: Payer: Medicare Other | Admitting: Internal Medicine

## 2013-04-18 NOTE — Telephone Encounter (Signed)
Follow up:  Pt states she is calling [name] back to get her test results on a PV study she had done earlier this month.

## 2013-04-18 NOTE — Telephone Encounter (Signed)
Left pt a message to call back. 

## 2013-04-19 ENCOUNTER — Other Ambulatory Visit: Payer: Self-pay

## 2013-04-19 MED ORDER — SIMVASTATIN 40 MG PO TABS: 40.0000 mg | ORAL_TABLET | Freq: Every day | ORAL | Status: DC

## 2013-04-19 NOTE — Telephone Encounter (Signed)
I spoke with the pt and made her aware of LEV results.

## 2013-04-19 NOTE — Telephone Encounter (Signed)
Left message home and cell for patient to call triage for test results

## 2013-04-21 ENCOUNTER — Telehealth: Payer: Self-pay | Admitting: Cardiovascular Disease

## 2013-04-21 NOTE — Telephone Encounter (Signed)
New problem     Has some questions about exercise.

## 2013-04-21 NOTE — Telephone Encounter (Signed)
I spoke with the pt and she is using a stationary bicycle and doing leg strengthening exercises and would like to know if this can make her swelling worse.  I made her aware that she is okay to exercise.  I did try to explain to the pt that if she is up on her feet more during the day then this can make her swelling worse. I recommended that the pt try OTC compression stockings at this time and elevate her legs when able.  The pt agreed with plan.

## 2013-05-01 ENCOUNTER — Ambulatory Visit (INDEPENDENT_AMBULATORY_CARE_PROVIDER_SITE_OTHER): Payer: Medicare Other | Admitting: Cardiovascular Disease

## 2013-05-01 ENCOUNTER — Encounter: Payer: Self-pay | Admitting: Cardiovascular Disease

## 2013-05-01 VITALS — BP 109/61 | HR 63 | Ht 67.0 in | Wt 150.0 lb

## 2013-05-01 DIAGNOSIS — I2510 Atherosclerotic heart disease of native coronary artery without angina pectoris: Secondary | ICD-10-CM

## 2013-05-01 DIAGNOSIS — R609 Edema, unspecified: Secondary | ICD-10-CM

## 2013-05-01 NOTE — Patient Instructions (Signed)
Your physician wants you to follow-up in: 4 MONTHS with Dr Excell Seltzer.  You will receive a reminder letter in the mail two months in advance. If you don't receive a letter, please call our office to schedule the follow-up appointment.  Your physician recommends that you return for lab work in: 4 MONTHS (BMP and BNP)  Your physician recommends that you continue on your current medications as directed. Please refer to the Current Medication list given to you today.

## 2013-05-01 NOTE — Progress Notes (Signed)
HPI:  72 year old woman presenting for followup evaluation. The patient has coronary artery disease and initially presented with a myocardial infarction in 2010. Her left circumflex was occluded at that time she underwent primary PCI with a bare-metal stent. She's had significant LV dysfunction related to akinesis of the posterolateral myocardium. Her most recent echo estimated an LVEF of 45% in December 2013. She was hospitalized in December 2013 with a right MCA stroke. There were multiple areas of infarction and also evidence of a previous hemorrhagic stroke. She also has a history of non-small cell lung cancer and right lower lobe segmental resection with chronic dyspnea. She has COPD and is followed by Dr. Delton Coombes.   She still complains of leg swelling when she is up on her feet during the day. She otherwise is doing well, as she denies chest pain, lightheadedness, palpitations, or syncope. Chronic shortness of breath is unchanged and is fairly mild.    Outpatient Encounter Prescriptions as of 05/01/2013  Medication Sig Dispense Refill  . albuterol (PROVENTIL HFA;VENTOLIN HFA) 108 (90 BASE) MCG/ACT inhaler Inhale 1 puff into the lungs daily.      Marland Kitchen aspirin 325 MG tablet Take 1 tablet (325 mg total) by mouth daily.      . calcium carbonate (OS-CAL - DOSED IN MG OF ELEMENTAL CALCIUM) 1250 MG tablet Take 2 tablets by mouth daily.       . carvedilol (COREG) 3.125 MG tablet Take 1 tablet (3.125 mg total) by mouth 2 (two) times daily with a meal.  60 tablet  11  . famotidine (PEPCID) 40 MG tablet Take 1 tablet (40 mg total) by mouth daily.  30 tablet  5  . furosemide (LASIX) 40 MG tablet Take 1 tablet (40 mg total) by mouth 2 (two) times daily.  60 tablet  6  . isosorbide mononitrate (IMDUR) 30 MG 24 hr tablet Take 0.5 tablets (15 mg total) by mouth daily.  90 tablet  3  . losartan (COZAAR) 50 MG tablet Take 1 tablet (50 mg total) by mouth daily.  30 tablet  6  . nitroGLYCERIN (NITROSTAT) 0.4 MG SL  tablet Place 1 tablet (0.4 mg total) under the tongue every 5 (five) minutes as needed for chest pain.  25 tablet  3  . simvastatin (ZOCOR) 40 MG tablet Take 1 tablet (40 mg total) by mouth at bedtime.  30 tablet  6  . spironolactone (ALDACTONE) 25 MG tablet Take 0.5 tablets (12.5 mg total) by mouth daily.  30 tablet  6  . tiotropium (SPIRIVA) 18 MCG inhalation capsule Place 1 capsule (18 mcg total) into inhaler and inhale daily.  30 capsule  11   No facility-administered encounter medications on file as of 05/01/2013.    No Known Allergies  Past Medical History  Diagnosis Date  . HTN (hypertension)   . Arthritis   . Macular degeneration   . Myocardial infarction     Acute myocardial infarction 2010 - treated with BMS of LCx. LVEF 50%, with subsequent CHF  . Lung cancer dx'd 07/2006    Lung CA, s/p resection, followed by Dr Shirline Frees  . Stroke     ROS: Negative except as per HPI  BP 109/61  Pulse 63  Ht 5\' 7"  (1.702 m)  Wt 150 lb (68.04 kg)  BMI 23.49 kg/m2  PHYSICAL EXAM: Pt is alert and oriented, NAD HEENT: normal Neck: JVP - normal, carotids 2+= without bruits Lungs: CTA bilaterally CV: RRR without murmur or gallop Abd: soft,  NT, Positive BS, no hepatomegaly Ext: trace edema on the right, none on the left, distal pulses intact and equal Skin: warm/dry no rash  ASSESSMENT AND PLAN: 1. Chronic combined systolic and diastolic heart failure. The patient is stable, now with NYHA Class 2 symptoms. Edema has improved with higher dose diuretics. She is taking lasix 40 mg daily and aldactone 12.5 mg daily. She will continue on this combination and I will see her back in 3 months with a metabolic panel on her return.  2. CAD, native vessel. Stable without anginal symptoms.   3. HTN - continue carvedilol, furosemide, isosorbide, losartan, and aldactone.  Tonny Bollman 05/02/2013 5:55 AM

## 2013-05-02 ENCOUNTER — Telehealth: Payer: Self-pay | Admitting: Internal Medicine

## 2013-05-02 ENCOUNTER — Encounter: Payer: Self-pay | Admitting: Internal Medicine

## 2013-05-02 ENCOUNTER — Ambulatory Visit (HOSPITAL_BASED_OUTPATIENT_CLINIC_OR_DEPARTMENT_OTHER): Payer: Medicare Other | Admitting: Internal Medicine

## 2013-05-02 VITALS — BP 108/66 | HR 67 | Temp 97.5°F | Resp 18 | Ht 67.0 in | Wt 151.0 lb

## 2013-05-02 DIAGNOSIS — C3490 Malignant neoplasm of unspecified part of unspecified bronchus or lung: Secondary | ICD-10-CM

## 2013-05-02 DIAGNOSIS — Z85118 Personal history of other malignant neoplasm of bronchus and lung: Secondary | ICD-10-CM

## 2013-05-02 NOTE — Telephone Encounter (Signed)
rs lab and ov for 10 of 2015 as per pof  printed cal and avs given to pt CT to call pt w CT appt shh

## 2013-05-02 NOTE — Progress Notes (Signed)
[facility] Telephone:([phone]) [phone]   Fax:([phone]  OFFICE PROGRESS NOTE  Ralene Ok, MD [address] [place] [address]  DIAGNOSIS: Stage IA (T1a, N0, MX) non-small cell lung cancer, adenocarcinoma diagnosed in November 2007.   PRIOR THERAPY: Status post right lower lobe superior segmentectomy with lymph node dissection under the care of Dr. Edwyna Shell on 08/15/2006.   CURRENT THERAPY: Observation.  INTERVAL HISTORY: [name] 72 y.o. female returns to the clinic today for annual followup visit. The patient is feeling fine today except for mild chest congestion started 2 days ago. She has mild cough but no hemoptysis. She has no chest pain or shortness of breath. The patient denied having any significant weight loss or night sweats. She denied having any nausea or vomiting. She had repeat CT scan of the chest performed recently and she is here for evaluation and discussion of her scan results.  MEDICAL HISTORY: Past Medical History  Diagnosis Date  . HTN (hypertension)   . Arthritis   . Macular degeneration   . Myocardial infarction     Acute myocardial infarction 2010 - treated with BMS of LCx. LVEF 50%, with subsequent CHF  . Lung cancer dx'd 07/2006    Lung CA, s/p resection, followed by Dr Shirline Frees  . Stroke     ALLERGIES:  has No Known Allergies.  MEDICATIONS:  Current Outpatient Prescriptions  Medication Sig Dispense Refill  . albuterol (PROVENTIL HFA;VENTOLIN HFA) 108 (90 BASE) MCG/ACT inhaler Inhale 1 puff into the lungs daily.      Marland Kitchen aspirin 325 MG tablet Take 1 tablet (325 mg total) by mouth daily.      . calcium carbonate (OS-CAL - DOSED IN MG OF ELEMENTAL CALCIUM) 1250 MG tablet Take 2 tablets by mouth daily.       . carvedilol (COREG) 3.125 MG tablet Take 1 tablet (3.125 mg total) by mouth 2 (two) times daily with a meal.  60 tablet  11  . famotidine (PEPCID) 40 MG tablet Take 1 tablet (40 mg total) by mouth daily.  30 tablet   5  . furosemide (LASIX) 40 MG tablet Take 40 mg by mouth daily.      . isosorbide mononitrate (IMDUR) 30 MG 24 hr tablet Take 0.5 tablets (15 mg total) by mouth daily.  90 tablet  3  . losartan (COZAAR) 50 MG tablet Take 1 tablet (50 mg total) by mouth daily.  30 tablet  6  . nitroGLYCERIN (NITROSTAT) 0.4 MG SL tablet Place 1 tablet (0.4 mg total) under the tongue every 5 (five) minutes as needed for chest pain.  25 tablet  3  . simvastatin (ZOCOR) 40 MG tablet Take 1 tablet (40 mg total) by mouth at bedtime.  30 tablet  6  . spironolactone (ALDACTONE) 25 MG tablet Take 0.5 tablets (12.5 mg total) by mouth daily.  30 tablet  6  . tiotropium (SPIRIVA) 18 MCG inhalation capsule Place 1 capsule (18 mcg total) into inhaler and inhale daily.  30 capsule  11   No current facility-administered medications for this visit.    SURGICAL HISTORY:  Past Surgical History  Procedure Laterality Date  . Lung removal, partial  2008    right  . H/o hysterectomy    . Breast biopsy      REVIEW OF SYSTEMS:  A comprehensive review of systems was negative except for: Respiratory: positive for cough   PHYSICAL EXAMINATION: General appearance: alert, cooperative and no distress Head:  Normocephalic, without obvious abnormality, atraumatic Neck: no adenopathy, no JVD, supple, symmetrical, trachea midline and thyroid not enlarged, symmetric, no tenderness/mass/nodules Lymph nodes: Cervical, supraclavicular, and axillary nodes normal. Resp: clear to auscultation bilaterally Cardio: regular rate and rhythm, S1, S2 normal, no murmur, click, rub or gallop GI: soft, non-tender; bowel sounds normal; no masses,  no organomegaly Extremities: extremities normal, atraumatic, no cyanosis or edema  ECOG PERFORMANCE STATUS: 1 - Symptomatic but completely ambulatory  Blood pressure 108/66, pulse 67, temperature 97.5 F (36.4 C), temperature source Oral, resp. rate 18, height 5\' 7"  (1.702 m), weight 151 lb (68.493 kg), SpO2  100.00%.  LABORATORY DATA: Lab Results  Component Value Date   WBC 5.9 04/11/2013   HGB 14.1 04/11/2013   HCT 41.1 04/11/2013   MCV 95.3 04/11/2013   PLT 250 04/11/2013      Chemistry      Component Value Date/Time   NA 140 04/11/2013 0759   NA 139 03/29/2013 1630   NA 134 04/06/2011 1312   K 4.6 04/11/2013 0759   K 4.0 03/29/2013 1630   K 4.5 04/06/2011 1312   CL 103 03/29/2013 1630   CL 102 04/18/2012 1057   CL 98 04/06/2011 1312   CO2 28 04/11/2013 0759   CO2 31 03/29/2013 1630   CO2 26 04/06/2011 1312   BUN 17.2 04/11/2013 0759   BUN 16 03/29/2013 1630   BUN 20 04/06/2011 1312   CREATININE 1.1 04/11/2013 0759   CREATININE 0.9 03/29/2013 1630   CREATININE 0.8 04/06/2011 1312      Component Value Date/Time   CALCIUM 10.1 04/11/2013 0759   CALCIUM 9.2 03/29/2013 1630   CALCIUM 9.4 04/06/2011 1312   ALKPHOS 120 04/11/2013 0759   ALKPHOS 109 07/08/2012 1902   ALKPHOS 98* 04/06/2011 1312   AST 23 04/11/2013 0759   AST 23 07/08/2012 1902   AST 23 04/06/2011 1312   ALT 19 04/11/2013 0759   ALT 18 07/08/2012 1902   ALT 22 04/06/2011 1312   BILITOT 0.45 04/11/2013 0759   BILITOT 0.3 07/08/2012 1902   BILITOT 0.60 04/06/2011 1312       RADIOGRAPHIC STUDIES: Ct Chest Wo Contrast  04/11/2013   CLINICAL DATA:  Lung cancer status post right lower lobectomy.  EXAM: CT CHEST WITHOUT CONTRAST  TECHNIQUE: Multidetector CT imaging of the chest was performed following the standard protocol without IV contrast.  COMPARISON:  PET-CT dated 10/06/2012. CT chest dated 09/21/2012.  FINDINGS: Status post right lower lobectomy.  Stable 14 x 8 mm nodular opacity at the left lung apex (series 5/ image 6), non FDG avid on prior PET, possibly reflecting nodular scarring.  No new/suspicious pulmonary nodules. Underlying mild to moderate centrilobular and paraseptal emphysematous changes. No pleural effusion or pneumothorax.  Visualized thyroid is unremarkable.  The heart is normal in size. No pericardial effusion. Coronary  atherosclerosis. Coronary stent. Atherosclerotic calcifications of the aortic arch.  Small mediastinal lymph nodes which do not meet pathologic CT size criteria. No suspicious axillary lymphadenopathy.  Visualized upper abdomen is notable for a small hiatal hernia and vascular calcifications.  Visualized osseous structures are within normal limits.  IMPRESSION: Status post right lower lobectomy.  Stable 14 x 8 mm nodular opacity at the left lung apex, non FDG avid on prior PET, favored to reflect nodular scarring.  No findings specific for recurrent or metastatic disease in the chest.   Electronically Signed   By: Charline Bills M.D.   On: 04/11/2013 09:38  ASSESSMENT AND PLAN: This is a very pleasant 72 years old white female with history of stage IA non-small cell lung cancer status post right lower lobe superior segmentectomy with lymph node dissection in January of 2008 and has been observation since that time with no evidence for disease recurrence. She has stable left lung apical lesion. I discussed the scan results with the patient. I recommended for her to continue on observation with repeat CT scan of the chest in one year. She was advised to call immediately if she has any concerning symptoms in the interval.  The patient voices understanding of current disease status and treatment options and is in agreement with the current care plan.  All questions were answered. The patient knows to call the clinic with any problems, questions or concerns. We can certainly see the patient much sooner if necessary.

## 2013-05-02 NOTE — Patient Instructions (Signed)
Followup visit in one year with repeat CT scan of the chest. 

## 2013-05-17 ENCOUNTER — Ambulatory Visit: Payer: Medicare Other | Admitting: Cardiovascular Disease

## 2013-07-05 ENCOUNTER — Other Ambulatory Visit: Payer: Self-pay

## 2013-07-05 DIAGNOSIS — K219 Gastro-esophageal reflux disease without esophagitis: Secondary | ICD-10-CM

## 2013-07-05 MED ORDER — FAMOTIDINE 40 MG PO TABS: 40.0000 mg | ORAL_TABLET | Freq: Every day | ORAL | Status: DC

## 2013-08-22 ENCOUNTER — Telehealth: Payer: Self-pay | Admitting: Emergency Medicine

## 2013-08-22 DIAGNOSIS — I2510 Atherosclerotic heart disease of native coronary artery without angina pectoris: Secondary | ICD-10-CM

## 2013-08-22 DIAGNOSIS — I10 Essential (primary) hypertension: Secondary | ICD-10-CM

## 2013-08-22 DIAGNOSIS — R609 Edema, unspecified: Secondary | ICD-10-CM

## 2013-08-22 MED ORDER — ALBUTEROL SULFATE HFA 108 (90 BASE) MCG/ACT IN AERS: 1.0000 | INHALATION_SPRAY | Freq: Every day | RESPIRATORY_TRACT | Status: DC

## 2013-08-22 NOTE — Telephone Encounter (Signed)
Rx has been sent in. Pt is aware. 

## 2013-08-23 ENCOUNTER — Telehealth: Payer: Self-pay | Admitting: Cardiovascular Disease

## 2013-08-23 NOTE — Telephone Encounter (Signed)
Pt wanted to know the dates of when she had a stroke. She is aware that it said in her records that she has a history of stroke  and CHF. Pt states "you answer my questions".

## 2013-08-23 NOTE — Telephone Encounter (Signed)
New problem   Pt need to speak to nurse concerning some problems she is having. Please call pt.

## 2013-08-30 ENCOUNTER — Encounter: Payer: Self-pay | Admitting: Cardiovascular Disease

## 2013-08-30 ENCOUNTER — Other Ambulatory Visit: Payer: Medicare Other

## 2013-08-30 ENCOUNTER — Ambulatory Visit (INDEPENDENT_AMBULATORY_CARE_PROVIDER_SITE_OTHER): Payer: Medicare Other | Admitting: Cardiovascular Disease

## 2013-08-30 VITALS — BP 120/70 | HR 65 | Ht 67.0 in | Wt 149.0 lb

## 2013-08-30 DIAGNOSIS — I5042 Chronic combined systolic (congestive) and diastolic (congestive) heart failure: Secondary | ICD-10-CM

## 2013-08-30 MED ORDER — OMEPRAZOLE 20 MG PO CPDR: 20.0000 mg | DELAYED_RELEASE_CAPSULE | Freq: Every day | ORAL | Status: DC

## 2013-08-30 NOTE — Patient Instructions (Signed)
Your physician has recommended you make the following change in your medication:  START Omeprazole 20 mg once daily for GERD  Your physician wants you to follow-up in: 6 months with Dr. Burt Knack.  You will receive a reminder letter in the mail two months in advance. If you don't receive a letter, please call our office to schedule the follow-up appointment.

## 2013-08-30 NOTE — Progress Notes (Signed)
HPI:  73 year old woman presenting for followup evaluation. The patient has coronary artery disease and initially presented with a myocardial infarction in 2010. Her left circumflex was occluded at that time she underwent primary PCI with a bare-metal stent. She's had significant LV dysfunction related to akinesis of the posterolateral myocardium. Her most recent echo estimated an LVEF of 45% in December 2013. She was hospitalized in December 2013 with a right MCA stroke. There were multiple areas of infarction and also evidence of a previous hemorrhagic stroke. She also has a history of non-small cell lung cancer and right lower lobe segmental resection with chronic dyspnea. She has COPD and is followed by Dr. Lamonte Sakai.  The patient is doing well from a cardiac perspective. She denies chest pain, shortness of breath, or palpitations. She has occasional swelling of her right leg which is unchanged over time. She's been compliant with her medications. She's had no further stroke or TIA symptoms. She has no complaints.   Outpatient Encounter Prescriptions as of 08/30/2013  Medication Sig  . albuterol (PROVENTIL HFA;VENTOLIN HFA) 108 (90 BASE) MCG/ACT inhaler Inhale 1 puff into the lungs daily.  Marland Kitchen aspirin 325 MG tablet Take 1 tablet (325 mg total) by mouth daily.  . calcium carbonate (OS-CAL - DOSED IN MG OF ELEMENTAL CALCIUM) 1250 MG tablet Take 2 tablets by mouth daily.   . carvedilol (COREG) 3.125 MG tablet Take 1 tablet (3.125 mg total) by mouth 2 (two) times daily with a meal.  . famotidine (PEPCID) 40 MG tablet Take 1 tablet (40 mg total) by mouth daily.  . furosemide (LASIX) 40 MG tablet Take 40 mg by mouth daily.  . isosorbide mononitrate (IMDUR) 30 MG 24 hr tablet Take 0.5 tablets (15 mg total) by mouth daily.  Marland Kitchen losartan (COZAAR) 50 MG tablet Take 1 tablet (50 mg total) by mouth daily.  . nitroGLYCERIN (NITROSTAT) 0.4 MG SL tablet Place 1 tablet (0.4 mg total) under the tongue every 5 (five)  minutes as needed for chest pain.  . simvastatin (ZOCOR) 40 MG tablet Take 1 tablet (40 mg total) by mouth at bedtime.  Marland Kitchen spironolactone (ALDACTONE) 25 MG tablet Take 0.5 tablets (12.5 mg total) by mouth daily.  Marland Kitchen tiotropium (SPIRIVA) 18 MCG inhalation capsule Place 1 capsule (18 mcg total) into inhaler and inhale daily.    No Known Allergies  Past Medical History  Diagnosis Date  . HTN (hypertension)   . Arthritis   . Macular degeneration   . Myocardial infarction     Acute myocardial infarction 2010 - treated with BMS of LCx. LVEF 50%, with subsequent CHF  . Lung cancer dx'd 07/2006    Lung CA, s/p resection, followed by Dr Earlie Server  . Stroke     ROS: Negative except as per HPI  BP 120/70  Pulse 65  Ht 5\' 7"  (1.702 m)  Wt 149 lb (67.586 kg)  BMI 23.33 kg/m2  PHYSICAL EXAM: Pt is alert and oriented, NAD HEENT: normal Neck: JVP - normal, carotids 2+= without bruits Lungs: CTA bilaterally CV: RRR without murmur or gallop Abd: soft, NT, Positive BS, no hepatomegaly Ext: no C/C/E, distal pulses intact and equal Skin: warm/dry no rash  ASSESSMENT AND PLAN: 1. Chronic combined systolic and diastolic heart failure. The patient remained stable from a cardiac perspective. She will continue on her current medical program which includes low doses of carvedilol losartan, as well as isosorbide and spironolactone.  2. Coronary artery disease, native vessel. No anginal symptoms. Continue  observation and medical therapy.  3. Hypertension. Blood pressure is under ideal control on multidrug therapy as outlined.  4. Hyperlipidemia. Patient takes simvastatin and is followed by her primary physician.  For followup I will see her back in 6 months.  Sherren Mocha 08/30/2013 1:52 PM

## 2013-09-05 ENCOUNTER — Other Ambulatory Visit: Payer: Self-pay | Admitting: Internal Medicine

## 2013-09-05 DIAGNOSIS — M810 Age-related osteoporosis without current pathological fracture: Secondary | ICD-10-CM

## 2013-09-13 ENCOUNTER — Other Ambulatory Visit: Payer: Medicare Other

## 2013-09-14 ENCOUNTER — Other Ambulatory Visit: Payer: Medicare Other

## 2013-09-24 DIAGNOSIS — M810 Age-related osteoporosis without current pathological fracture: Secondary | ICD-10-CM

## 2013-09-24 HISTORY — DX: Age-related osteoporosis without current pathological fracture: M81.0

## 2013-09-27 ENCOUNTER — Ambulatory Visit
Admission: RE | Admit: 2013-09-27 | Discharge: 2013-09-27 | Disposition: A | Discharge status: Home / Self Care | Payer: Self-pay | Source: Ambulatory Visit | Attending: Internal Medicine | Admitting: Internal Medicine

## 2013-09-27 DIAGNOSIS — M810 Age-related osteoporosis without current pathological fracture: Secondary | ICD-10-CM

## 2013-09-27 NOTE — Telephone Encounter (Signed)
Closing encounter

## 2013-10-16 ENCOUNTER — Telehealth: Payer: Self-pay | Admitting: Cardiovascular Disease

## 2013-10-16 ENCOUNTER — Telehealth: Payer: Self-pay | Admitting: Emergency Medicine

## 2013-10-16 NOTE — Telephone Encounter (Signed)
lmtcb

## 2013-10-16 NOTE — Telephone Encounter (Signed)
New message  Patient has questions regarding calcium tablets. Please call and advise.

## 2013-10-16 NOTE — Telephone Encounter (Signed)
Pt called to advise RB has stopped spiriva believes she can breath better with out using it. Scheduled pt appointment to f/u with RB 4/13. Nothing further needed

## 2013-10-16 NOTE — Telephone Encounter (Signed)
Called stating her PCP wanted her to go on Fosamax but she doesn't want to use that.  Wants to know if she can go back on her calcium meds. Advised that she should contact her PCP that we would advise her for her heart medications. She will call Dr. Mellody Drown.

## 2013-10-19 ENCOUNTER — Telehealth: Payer: Self-pay | Admitting: Emergency Medicine

## 2013-10-19 NOTE — Telephone Encounter (Signed)
Called and spoke with pt. She reports she feels like her fosamax is what is causing her SOB. She is going to call the doc who prescribed this for her and if this is not so she will let us know to schedule sooner appt. Nothing further needed

## 2013-11-06 ENCOUNTER — Ambulatory Visit (INDEPENDENT_AMBULATORY_CARE_PROVIDER_SITE_OTHER): Payer: Medicare Other | Admitting: Emergency Medicine

## 2013-11-06 ENCOUNTER — Encounter: Payer: Self-pay | Admitting: Emergency Medicine

## 2013-11-06 VITALS — BP 128/74 | HR 62 | Ht 66.5 in | Wt 149.0 lb

## 2013-11-06 DIAGNOSIS — R911 Solitary pulmonary nodule: Secondary | ICD-10-CM

## 2013-11-06 DIAGNOSIS — J449 Chronic obstructive pulmonary disease, unspecified: Secondary | ICD-10-CM

## 2013-11-06 NOTE — Assessment & Plan Note (Signed)
-   she has not needed or benefited from Spiriva - continue SABA prn

## 2013-11-06 NOTE — Progress Notes (Signed)
Subjective:    Patient ID: [name], female    DOB: [birth year], 73 y.o.   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 She has been having dyspnea for > 1 year, some cough that is productive of white mucous. She has hoarse voice frequently, not necessarily related to cough. Not clear that it relates in time to symbicort.   Part of her eval has included CXR that showed a 1.4x0.6cm  L apical nodule, confirmed by CT scan 09/28/12. PET on 10/07/12 showed the nodule was cold.   She is on Symbicort qam only for over a year, feels that it helps her.   PFT 09/02/12 >> moderately severe AFL without BD response, normal volumes, decreased diffusion.   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 PET negative 1.4x0.6cm  L apical nodule 10/07/12. We stopped symbicort, started spiriva last time.  She doesn't think the SOB has changed any since starting. She is still having some mucous but better. Still has cough, esp in the am. She doesn't have a SABA. She is due for CT scan chest w Dr Julien Nordmann in 9/'14. Not on nasal steroid or anti-histamine.   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 PET negative 1.4x0.6cm  L apical nodule 10/07/12. Stable by CT scan 04/10/13.  Had been doing well until this week when she got sore throat, dry cough. Using spiriva, albuterol prn > about once a day.  She has flonase at home but hasn't used it yet.   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 We are following PET negative L apical nodule, repeat Ct scan due in September. She stopped her spiriva due to some associated SOB, ? Due to UA  effects. She tells me that she had a possible rxn to fosamax, caused dyspnea and ? Throat swelling. She feels that her breathing is better. She uses 1 puff her rescue SABA about once a day. Doesn't feel that she misses the spiriva.    Review of Systems  Constitutional: Negative for fever, chills and unexpected weight change.  HENT: Positive for congestion, postnasal drip and rhinorrhea. Negative for dental problem, ear pain, nosebleeds, sinus pressure, sneezing, sore throat and trouble swallowing.   Eyes: Negative for redness and itching.  Respiratory: Positive for cough. Negative for chest tightness, shortness of breath and wheezing.   Cardiovascular: Negative for palpitations and leg swelling.  Gastrointestinal: Negative for nausea and vomiting.  Genitourinary: Negative for dysuria.  Musculoskeletal: Negative for joint swelling.  Skin: Negative for rash.  Neurological: Negative for headaches.  Hematological: Does not bruise/bleed easily.  Psychiatric/Behavioral: Negative for dysphoric mood. The patient is not nervous/anxious.       Objective:   Physical Exam Filed Vitals:   11/06/13 1443  BP: 128/74  Pulse: 62  Height: 5' 6.5" (1.689 m)  Weight: 149 lb (67.586 kg)  SpO2: 97%   Gen: Pleasant, well-nourished, in no distress,  normal affect  ENT: No lesions,  mouth clear,  oropharynx clear, no postnasal drip, hoarse voice  Neck: No JVD, no TMG, no carotid bruits  Lungs: No use of accessory muscles, very distant, clear  without rales or rhonchi  Cardiovascular: RRR, heart sounds normal, no murmur or gallops, no peripheral edema  Musculoskeletal: No deformities, no cyanosis or clubbing  Neuro: alert, non focal  Skin: Warm, no lesions or rashes  04/10/13 --  COMPARISON: PET-CT dated 10/06/2012. CT chest dated 09/21/2012.  FINDINGS:  Status post right lower lobectomy.  Stable 14 x 8 mm nodular opacity at the left lung apex (series 5/  image 6), non FDG avid on prior PET,  possibly reflecting nodular  scarring.  No new/suspicious pulmonary nodules. Underlying mild to moderate  centrilobular and paraseptal emphysematous changes. No pleural  effusion or pneumothorax.  Visualized thyroid is unremarkable.  The heart is normal in size. No pericardial effusion. Coronary  atherosclerosis. Coronary stent. Atherosclerotic calcifications of  the aortic arch.  Small mediastinal lymph nodes which do not meet pathologic CT size  criteria. No suspicious axillary lymphadenopathy.  Visualized upper abdomen is notable for a small hiatal hernia and  vascular calcifications.  Visualized osseous structures are within normal limits.  IMPRESSION:  Status post right lower lobectomy.  Stable 14 x 8 mm nodular opacity at the left lung apex, non FDG avid  on prior PET, favored to reflect nodular scarring.  No findings specific for recurrent or metastatic disease in the  chest.    PET scan 10/07/12 --  Comparison: CT chest dated 09/21/2012 and 10/09 23,013. PET CT  dated 05/23/2009.  Findings:  Neck: No hypermetabolic lymph nodes in the neck.  Old right parietal infarct (series 2/image 2).  Chest: Nodular opacity at the medial left lung apex (series  2/image 61), unchanged from 04/18/2012. This is non-FDG-avid and  is favored to reflect apical scarring.  Additional pleural parenchymal scarring at the right lung apex.  Status post right lower lobe wedge resection. Moderate  centrilobular and paraseptal emphysematous changes.  6 mm ground-glass nodule in the right upper lobe (series 2/image  83), similar to 05/23/2009. While non-FDG-avid, this is below the  size threshold for PET sensitivity.  No hypermetabolic mediastinal or hilar nodes. Cardiomegaly.  Coronary atherosclerosis. Atherosclerotic calcifications of the  aortic arch.  Abdomen/Pelvis: No abnormal hypermetabolic activity within the  liver, pancreas, adrenal glands, or spleen.  Atherosclerotic calcifications of  the abdominal aorta and branch  vessels. Status post hysterectomy.  No hypermetabolic lymph nodes in the abdomen or pelvis.  Skeleton: No focal hypermetabolic activity to suggest skeletal  metastasis.  Healing/healed left anterolateral fifth rib fracture (series  2/image 116).  IMPRESSION:  Nodular opacity at the medial left lung apex is non-FDG-avid and is  favored to reflect apical scarring.  Status post right lower lobe wedge resection.  No evidence of metastatic disease       Assessment & Plan:  COPD (chronic obstructive pulmonary disease) - she has not needed or benefited from Spiriva - continue SABA prn  Pulmonary nodule, left Repeat scan in 9/15

## 2013-11-06 NOTE — Assessment & Plan Note (Signed)
Repeat scan in 9/15

## 2013-11-06 NOTE — Patient Instructions (Signed)
We will not restart spiriva at this time.  Use your albuterol as needed for shortness of breath You need a repeat CT chest in September 2015.  Follow with Dr Lamonte Sakai in 12 months or sooner if you have any problems

## 2013-11-08 ENCOUNTER — Other Ambulatory Visit: Payer: Self-pay

## 2013-11-08 DIAGNOSIS — I5023 Acute on chronic systolic (congestive) heart failure: Secondary | ICD-10-CM

## 2013-11-08 DIAGNOSIS — I2510 Atherosclerotic heart disease of native coronary artery without angina pectoris: Secondary | ICD-10-CM

## 2013-11-08 MED ORDER — LOSARTAN POTASSIUM 50 MG PO TABS: 50.0000 mg | ORAL_TABLET | Freq: Every day | ORAL | Status: DC

## 2013-11-09 ENCOUNTER — Telehealth: Payer: Self-pay | Admitting: Emergency Medicine

## 2013-11-09 NOTE — Telephone Encounter (Signed)
LMTCB on home and mobile number. Pt will need to verify preferred pharmacy to send rx to.

## 2013-11-09 NOTE — Telephone Encounter (Signed)
Spoke with pt. She c/o sore throat, PND, chest tx and loss of voice. Pt was very hoarse on the phone. She reports RB told her he would call something in for her if she was not feeling better. Pt does not want to come in for OV since she was seen 11/06/13. RB on vacation. Will forward to CDY for recs. Please advise thanks  No Known Allergies   Current Outpatient Prescriptions on File Prior to Visit  Medication Sig Dispense Refill  . albuterol (PROVENTIL HFA;VENTOLIN HFA) 108 (90 BASE) MCG/ACT inhaler Inhale 1 puff into the lungs daily.  1 Inhaler  2  . aspirin 325 MG tablet Take 1 tablet (325 mg total) by mouth daily.      . Calcium Carbonate-Vitamin D (CALCIUM-VITAMIN D) 500-200 MG-UNIT per tablet Take 2 tablets by mouth daily.      . carvedilol (COREG) 3.125 MG tablet Take 1 tablet (3.125 mg total) by mouth 2 (two) times daily with a meal.  60 tablet  11  . fluticasone (FLONASE) 50 MCG/ACT nasal spray Place 2 sprays into both nostrils daily.      . furosemide (LASIX) 40 MG tablet Take 40 mg by mouth daily.      . isosorbide mononitrate (IMDUR) 30 MG 24 hr tablet Take 0.5 tablets (15 mg total) by mouth daily.  90 tablet  3  . losartan (COZAAR) 50 MG tablet Take 1 tablet (50 mg total) by mouth daily.  30 tablet  6  . nitroGLYCERIN (NITROSTAT) 0.4 MG SL tablet Place 1 tablet (0.4 mg total) under the tongue every 5 (five) minutes as needed for chest pain.  25 tablet  3  . omeprazole (PRILOSEC) 20 MG capsule Take 1 capsule (20 mg total) by mouth daily.  30 capsule  11  . simvastatin (ZOCOR) 40 MG tablet Take 1 tablet (40 mg total) by mouth at bedtime.  30 tablet  6  . spironolactone (ALDACTONE) 25 MG tablet Take 0.5 tablets (12.5 mg total) by mouth daily.  30 tablet  6   No current facility-administered medications on file prior to visit.

## 2013-11-09 NOTE — Telephone Encounter (Signed)
Offer doxycycline 100 mg, # 8, 2 today then one daily 

## 2013-11-10 MED ORDER — DOXYCYCLINE HYCLATE 100 MG PO CAPS: ORAL_CAPSULE | ORAL | Status: DC

## 2013-11-10 NOTE — Telephone Encounter (Signed)
Spoke with the pt and notified of recs per CDY  She verbalized understanding and rx was sent to [facility]

## 2013-11-10 NOTE — Telephone Encounter (Signed)
Pt returned call & can be reached at [phone].  Pharmacy is [facility].  Satira Anis

## 2013-11-28 ENCOUNTER — Other Ambulatory Visit: Payer: Self-pay | Admitting: *Deleted

## 2013-11-28 DIAGNOSIS — R079 Chest pain, unspecified: Secondary | ICD-10-CM

## 2013-11-28 MED ORDER — ISOSORBIDE MONONITRATE ER 30 MG PO TB24: 15.0000 mg | ORAL_TABLET | Freq: Every day | ORAL | Status: DC

## 2013-12-13 ENCOUNTER — Other Ambulatory Visit: Payer: Self-pay

## 2013-12-13 DIAGNOSIS — I2510 Atherosclerotic heart disease of native coronary artery without angina pectoris: Secondary | ICD-10-CM

## 2013-12-13 DIAGNOSIS — I5023 Acute on chronic systolic (congestive) heart failure: Secondary | ICD-10-CM

## 2013-12-13 MED ORDER — SIMVASTATIN 40 MG PO TABS: 40.0000 mg | ORAL_TABLET | Freq: Every day | ORAL | Status: DC

## 2013-12-13 MED ORDER — LOSARTAN POTASSIUM 50 MG PO TABS: 50.0000 mg | ORAL_TABLET | Freq: Every day | ORAL | Status: DC

## 2014-01-15 ENCOUNTER — Other Ambulatory Visit: Payer: Self-pay

## 2014-01-15 MED ORDER — FUROSEMIDE 40 MG PO TABS: 40.0000 mg | ORAL_TABLET | Freq: Every day | ORAL | Status: DC

## 2014-01-16 ENCOUNTER — Telehealth: Payer: Self-pay | Admitting: *Deleted

## 2014-01-16 NOTE — Telephone Encounter (Signed)
Maudie Mercury can you look at this? [facility] requests famotidine refill for patient. Is she to be on this as well as the omeprazole? Please advise. Thanks, MI

## 2014-01-16 NOTE — Telephone Encounter (Signed)
Left patient message is she on pepcid and or omeprazole? She is to return call to [place].

## 2014-01-17 NOTE — Telephone Encounter (Signed)
Spoke with patient and she stated that she takes omeprazole and is not taking the famotidine.

## 2014-01-21 ENCOUNTER — Other Ambulatory Visit: Payer: Self-pay | Admitting: Cardiovascular Disease

## 2014-02-26 ENCOUNTER — Other Ambulatory Visit: Payer: Self-pay | Admitting: Cardiovascular Disease

## 2014-02-27 ENCOUNTER — Telehealth: Payer: Self-pay | Admitting: *Deleted

## 2014-02-27 ENCOUNTER — Encounter: Payer: Self-pay | Admitting: Physician Assistant

## 2014-02-27 ENCOUNTER — Ambulatory Visit (INDEPENDENT_AMBULATORY_CARE_PROVIDER_SITE_OTHER): Payer: Medicare Other | Admitting: Physician Assistant

## 2014-02-27 VITALS — BP 110/70 | HR 56 | Ht 66.5 in | Wt 141.0 lb

## 2014-02-27 DIAGNOSIS — I2589 Other forms of chronic ischemic heart disease: Secondary | ICD-10-CM

## 2014-02-27 DIAGNOSIS — I10 Essential (primary) hypertension: Secondary | ICD-10-CM

## 2014-02-27 DIAGNOSIS — I255 Ischemic cardiomyopathy: Secondary | ICD-10-CM

## 2014-02-27 DIAGNOSIS — R0789 Other chest pain: Secondary | ICD-10-CM

## 2014-02-27 DIAGNOSIS — I5042 Chronic combined systolic (congestive) and diastolic (congestive) heart failure: Secondary | ICD-10-CM

## 2014-02-27 DIAGNOSIS — E785 Hyperlipidemia, unspecified: Secondary | ICD-10-CM

## 2014-02-27 DIAGNOSIS — I2510 Atherosclerotic heart disease of native coronary artery without angina pectoris: Secondary | ICD-10-CM

## 2014-02-27 HISTORY — DX: Ischemic cardiomyopathy: I25.5

## 2014-02-27 LAB — BASIC METABOLIC PANEL
BUN: 18 mg/dL (ref 6–23)
CO2: 32 mEq/L (ref 19–32)
Calcium: 9.3 mg/dL (ref 8.4–10.5)
Chloride: 99 mEq/L (ref 96–112)
Creatinine, Ser: 0.9 mg/dL (ref 0.4–1.2)
GFR: 64.37 mL/min (ref >60.00)
Glucose, Bld: 83 mg/dL (ref 70–99)
POTASSIUM: 3.8 meq/L (ref 3.5–5.1)
SODIUM: 137 meq/L (ref 135–145)

## 2014-02-27 MED ORDER — NITROGLYCERIN 0.4 MG SL SUBL: 0.4000 mg | SUBLINGUAL_TABLET | SUBLINGUAL | Status: DC | PRN

## 2014-02-27 MED ORDER — ISOSORBIDE MONONITRATE ER 30 MG PO TB24: 15.0000 mg | ORAL_TABLET | Freq: Every day | ORAL | Status: DC

## 2014-02-27 NOTE — Patient Instructions (Addendum)
A REFILL FOR NTG AND IMDUR HAS BEEN SENT IN TODAY  LAB WORK TODAY; BMET  Your physician wants you to follow-up in: [place] DR. Burt Knack. You will receive a reminder letter in the mail two months in advance. If you don't receive a letter, please call our office to schedule the follow-up appointment.

## 2014-02-27 NOTE — Progress Notes (Signed)
Cardiology Office Note    Date:  02/27/2014   ID:  [name], DOB 02-11-1941, MRN [id number]  PCP:  [name], MD  Cardiologist:  Dr. Sherren Mocha      History of Present Illness: [name] is a 73 y.o. female with a history of CAD status post myocardial infarction in 2010 treated with a bare-metal stent to the circumflex, ischemic cardiomyopathy with an EF of 46-56%, combined systolic and diastolic CHF, right MCA stroke in 06/2012, non-small cell lung CA status post right lower lobe segmental resection, COPD and chronic dyspnea. Last seen by Dr. Burt Knack 08/2013.  She returns for routine followup. She has been doing well. She continues to have chronic dyspnea related to COPD. There has been no significant change over the last 6 months. She is NYHA 2b-3.  She denies orthopnea, PND or edema.  She has rare episodes of chest pain that are nitroglycerin responsive. She's had this for quite some time without significant change.   Studies:  - Nuclear (10/10):  EF 32%, inf-lat scar; no ischemia  - LHC (10/10):  Inferolateral akinesis and dyskinesis, EF 40%, D1 30%, LAD 50-60%, mid CFX stent patent, proximal RCA 30%, mid RCA 50%  - Echo (12/13):  EF 45-50%, posterior wall akinesis, lateral wall and inferior wall hypokinesis, mild MR  - Carotid US (12/13):  No significant ICA stenosis   Recent Labs/Images: 03/29/2013: Pro B Natriuretic peptide (BNP) 271.0*  04/11/2013: ALT 19; Creatinine 1.1; Hemoglobin 14.1; Potassium 4.6    Wt Readings from Last 3 Encounters:  02/27/14 141 lb (63.957 kg)  11/06/13 149 lb (67.586 kg)  08/30/13 149 lb (67.586 kg)     Past Medical History  Diagnosis Date  . HTN (hypertension)   . Arthritis   . Macular degeneration   . Myocardial infarction     Acute myocardial infarction 2010 - treated with BMS of LCx. LVEF 50%, with subsequent CHF  . Lung cancer dx'd 07/2006    Lung CA, s/p resection, followed by Dr Earlie Server  . Stroke     Current  Outpatient Prescriptions  Medication Sig Dispense Refill  . albuterol (PROVENTIL HFA;VENTOLIN HFA) 108 (90 BASE) MCG/ACT inhaler Inhale 1 puff into the lungs daily.  1 Inhaler  2  . aspirin 325 MG tablet Take 1 tablet (325 mg total) by mouth daily.      . Calcium Carbonate-Vitamin D (CALCIUM-VITAMIN D) 500-200 MG-UNIT per tablet Take 2 tablets by mouth daily.      . carvedilol (COREG) 3.125 MG tablet TAKE ONE (1) TABLET BY MOUTH TWO (2) TIMES DAILY WITH A MEAL  60 tablet  1  . doxycycline (VIBRAMYCIN) 100 MG capsule 2 today, then 1 daily until gone  8 capsule  0  . fluticasone (FLONASE) 50 MCG/ACT nasal spray Place 2 sprays into both nostrils daily.      . furosemide (LASIX) 40 MG tablet Take 1 tablet (40 mg total) by mouth daily.  30 tablet  6  . isosorbide mononitrate (IMDUR) 30 MG 24 hr tablet Take 0.5 tablets (15 mg total) by mouth daily.  90 tablet  0  . losartan (COZAAR) 50 MG tablet Take 1 tablet (50 mg total) by mouth daily.  30 tablet  6  . nitroGLYCERIN (NITROSTAT) 0.4 MG SL tablet Place 1 tablet (0.4 mg total) under the tongue every 5 (five) minutes as needed for chest pain.  25 tablet  3  . omeprazole (PRILOSEC) 20 MG capsule Take 1 capsule (20 mg  total) by mouth daily.  30 capsule  11  . simvastatin (ZOCOR) 40 MG tablet Take 1 tablet (40 mg total) by mouth at bedtime.  30 tablet  6  . spironolactone (ALDACTONE) 25 MG tablet Take 0.5 tablets (12.5 mg total) by mouth daily.  30 tablet  6   No current facility-administered medications for this visit.     Allergies:   Review of patient's allergies indicates no known allergies.   Social History:  The patient  reports that she quit smoking about 7 years ago. Her smoking use included Cigarettes. She has a 80 pack-year smoking history. She has never used smokeless tobacco. She reports that she does not drink alcohol.   Family History:  The patient's family history includes Breast cancer in her sister; CAD in her father; Coronary artery  disease in her mother; Heart attack in her mother; Hypertension in her mother; Stroke in her father and mother.   ROS:  Please see the history of present illness.   She has a chronic cough.   All other systems reviewed and negative.   PHYSICAL EXAM: VS:  BP 110/70  Pulse 56  Ht 5' 6.5" (1.689 m)  Wt 141 lb (63.957 kg)  BMI 22.42 kg/m2 Well nourished, well developed, in no acute distress HEENT: normal Neck: no JVD Cardiac:  normal S1, S2; RRR; no murmur Lungs:  Decreased breath sounds bilaterally, no wheezing, rhonchi or rales Abd: soft, nontender, no hepatomegaly Ext: no edema Skin: warm and dry Neuro:  CNs 2-12 intact, no focal abnormalities noted  EKG:  Sinus brady, HR 56, normal axis, inf-lat ST changes similar to prior tracings     ASSESSMENT AND PLAN:  Coronary Artery Disease:  She has rare episodes of chest pain. She takes nitroglycerin as needed. There has been no significant escalation or worsening symptoms. Continue current therapy which includes aspirin, beta blocker, nitrates, statin.  Ischemic cardiomyopathy:  Continue current regimen which includes beta blocker, ARB, nitrates, spironolactone.  Chronic combined systolic and diastolic heart failure:  Volume stable. Obtain follow up basic metabolic panel today.  Hypertension:  Controlled.  Hyperlipidemia:  Continue statin. Labs are managed by primary care.    Disposition:  Follow up with Dr. Burt Knack in 6 months.   Signed, Versie Starks, MHS 02/27/2014 2:11 PM    [facility] [address], [place], [place]  [address] Phone: [phone]; Fax: [phone]

## 2014-02-27 NOTE — Telephone Encounter (Signed)
pt notified about lab results with verbal understanding  

## 2014-03-01 ENCOUNTER — Other Ambulatory Visit: Payer: Self-pay

## 2014-03-01 DIAGNOSIS — Z1231 Encounter for screening mammogram for malignant neoplasm of breast: Secondary | ICD-10-CM

## 2014-03-07 LAB — HM MAMMOGRAPHY: HM MAMMO: NORMAL

## 2014-03-08 ENCOUNTER — Other Ambulatory Visit: Payer: Self-pay | Admitting: Emergency Medicine

## 2014-03-16 ENCOUNTER — Ambulatory Visit
Admission: RE | Admit: 2014-03-16 | Discharge: 2014-03-16 | Disposition: A | Discharge status: Home / Self Care | Payer: Medicare Other | Source: Ambulatory Visit

## 2014-03-16 DIAGNOSIS — Z1231 Encounter for screening mammogram for malignant neoplasm of breast: Secondary | ICD-10-CM

## 2014-03-27 ENCOUNTER — Telehealth: Payer: Self-pay | Admitting: Emergency Medicine

## 2014-03-27 NOTE — Telephone Encounter (Signed)
Called spoke with pt. She reports her flonase is $45. She will contact her insurance to see what she can afford and call us back. Will sign off for now

## 2014-04-03 ENCOUNTER — Other Ambulatory Visit: Payer: Self-pay | Admitting: Cardiovascular Disease

## 2014-04-30 ENCOUNTER — Ambulatory Visit (HOSPITAL_COMMUNITY)
Admission: RE | Admit: 2014-04-30 | Discharge: 2014-04-30 | Disposition: A | Discharge status: Home / Self Care | Payer: Medicare Other | Source: Ambulatory Visit | Attending: Internal Medicine | Admitting: Internal Medicine

## 2014-04-30 ENCOUNTER — Other Ambulatory Visit (HOSPITAL_BASED_OUTPATIENT_CLINIC_OR_DEPARTMENT_OTHER): Payer: Medicare Other

## 2014-04-30 ENCOUNTER — Encounter (HOSPITAL_COMMUNITY): Payer: Self-pay

## 2014-04-30 DIAGNOSIS — C3490 Malignant neoplasm of unspecified part of unspecified bronchus or lung: Secondary | ICD-10-CM | POA: Insufficient documentation

## 2014-04-30 LAB — CBC WITH DIFFERENTIAL/PLATELET
BASO%: 0.8 % (ref 0.0–2.0)
BASOS ABS: 0.1 10*3/uL (ref 0.0–0.1)
EOS%: 6.8 % (ref 0.0–7.0)
Eosinophils Absolute: 0.5 10*3/uL (ref 0.0–0.5)
HCT: 39.3 % (ref 34.8–46.6)
HGB: 12.9 g/dL (ref 11.6–15.9)
LYMPH%: 36.7 % (ref 14.0–49.7)
MCH: 31.5 pg (ref 25.1–34.0)
MCHC: 32.9 g/dL (ref 31.5–36.0)
MCV: 95.5 fL (ref 79.5–101.0)
MONO#: 0.6 10*3/uL (ref 0.1–0.9)
MONO%: 9.3 % (ref 0.0–14.0)
NEUT#: 3.2 10*3/uL (ref 1.5–6.5)
NEUT%: 46.4 % (ref 38.4–76.8)
PLATELETS: 213 10*3/uL (ref 145–400)
RBC: 4.11 10*6/uL (ref 3.70–5.45)
RDW: 13 % (ref 11.2–14.5)
WBC: 6.8 10*3/uL (ref 3.9–10.3)
lymph#: 2.5 10*3/uL (ref 0.9–3.3)

## 2014-04-30 LAB — COMPREHENSIVE METABOLIC PANEL (CC13)
ALBUMIN: 3.4 g/dL — AB (ref 3.5–5.0)
ALK PHOS: 96 U/L (ref 40–150)
ALT: 16 U/L (ref 0–55)
AST: 18 U/L (ref 5–34)
Anion Gap: 8 mEq/L (ref 3–11)
BILIRUBIN TOTAL: 0.41 mg/dL (ref 0.20–1.20)
BUN: 20.7 mg/dL (ref 7.0–26.0)
CO2: 27 mEq/L (ref 22–29)
CREATININE: 1 mg/dL (ref 0.6–1.1)
Calcium: 9.7 mg/dL (ref 8.4–10.4)
Chloride: 104 mEq/L (ref 98–109)
GLUCOSE: 97 mg/dL (ref 70–140)
POTASSIUM: 4 meq/L (ref 3.5–5.1)
Sodium: 139 mEq/L (ref 136–145)
Total Protein: 6.9 g/dL (ref 6.4–8.3)

## 2014-05-02 ENCOUNTER — Ambulatory Visit: Payer: Medicare Other | Admitting: Internal Medicine

## 2014-05-04 ENCOUNTER — Other Ambulatory Visit: Payer: Self-pay | Admitting: *Deleted

## 2014-05-04 ENCOUNTER — Telehealth: Payer: Self-pay | Admitting: Internal Medicine

## 2014-05-04 NOTE — Telephone Encounter (Signed)
Lft msg for pt confirming MD visit [place] f/u from 10/07 per 10/09 POF, mailed sch...Marland KitchenMarland KitchenMarland Kitchen KJ

## 2014-05-10 ENCOUNTER — Telehealth: Payer: Self-pay | Admitting: Nurse Practitioner

## 2014-05-10 NOTE — Telephone Encounter (Signed)
Patient calling to learn results of recent scan. Patient informed of MD appt on 05/15/14 at 2 pm. She states she had no knowledge of this appointment and wants to be called on Monday to be reminded. Patient informed we will call her on 05/14/14 with reminder. She verbalizes understanding.

## 2014-05-15 ENCOUNTER — Encounter: Payer: Self-pay | Admitting: Internal Medicine

## 2014-05-15 ENCOUNTER — Ambulatory Visit (HOSPITAL_BASED_OUTPATIENT_CLINIC_OR_DEPARTMENT_OTHER): Payer: Medicare Other | Admitting: Internal Medicine

## 2014-05-15 ENCOUNTER — Telehealth: Payer: Self-pay | Admitting: Internal Medicine

## 2014-05-15 VITALS — BP 111/55 | HR 62 | Temp 97.7°F | Resp 17 | Ht 66.5 in | Wt 140.0 lb

## 2014-05-15 DIAGNOSIS — C3431 Malignant neoplasm of lower lobe, right bronchus or lung: Secondary | ICD-10-CM

## 2014-05-15 DIAGNOSIS — Z85118 Personal history of other malignant neoplasm of bronchus and lung: Secondary | ICD-10-CM

## 2014-05-15 NOTE — Progress Notes (Signed)
[facility] [phone]   [phone]  OFFICE PROGRESS NOTE  Jodi Panda, MD [address] [place] [address]  DIAGNOSIS: Stage IA (T1a, N0, MX) non-small cell lung cancer, adenocarcinoma diagnosed in November 2007.   PRIOR THERAPY: Status post right lower lobe superior segmentectomy with lymph node dissection under the care of Dr. Arlyce Dice on 08/15/2006.   CURRENT THERAPY: Observation.  INTERVAL HISTORY: [name] 73 y.o. female returns to the clinic today for annual followup visit. The patient is feeling fine today with no specific complaints. She denied having any significant chest pain, shortness of breath, cough or hemoptysis. The patient denied having any significant weight loss or night sweats. She denied having any nausea or vomiting. She had repeat CT scan of the chest performed recently and she is here for evaluation and discussion of her scan results.  MEDICAL HISTORY: Past Medical History  Diagnosis Date  . HTN (hypertension)   . Arthritis   . Macular degeneration   . Myocardial infarction     Acute myocardial infarction 2010 - treated with BMS of LCx. LVEF 50%, with subsequent CHF  . Lung cancer dx'd 07/2006    Lung CA, s/p resection, followed by Dr Earlie Server  . Stroke     ALLERGIES:  has No Known Allergies.  MEDICATIONS:  Current Outpatient Prescriptions  Medication Sig Dispense Refill  . aspirin 325 MG tablet Take 1 tablet (325 mg total) by mouth daily.      . Calcium Carbonate-Vitamin D (CALCIUM-VITAMIN D) 500-200 MG-UNIT per tablet Take 2 tablets by mouth daily.      . carvedilol (COREG) 3.125 MG tablet TAKE 1 TABLET BY MOUTH TWICE A DAY WITH A MEAL  60 tablet  5  . fluticasone (FLONASE) 50 MCG/ACT nasal spray INHALE TWO (2) SPRAYS INTO THE NOSE DAILY AS DIRECTED  16 g  1  . FLUZONE HIGH-DOSE 0.5 ML SUSY       . furosemide (LASIX) 40 MG tablet Take 1 tablet (40 mg total) by mouth daily.  30 tablet  6  . isosorbide  mononitrate (IMDUR) 30 MG 24 hr tablet Take 0.5 tablets (15 mg total) by mouth daily.  90 tablet  3  . losartan (COZAAR) 50 MG tablet Take 1 tablet (50 mg total) by mouth daily.  30 tablet  6  . LOTEMAX 0.5 % ophthalmic suspension       . nitroGLYCERIN (NITROSTAT) 0.4 MG SL tablet Place 1 tablet (0.4 mg total) under the tongue every 5 (five) minutes as needed for chest pain.  25 tablet  3  . omeprazole (PRILOSEC) 20 MG capsule Take 1 capsule (20 mg total) by mouth daily.  30 capsule  11  . simvastatin (ZOCOR) 40 MG tablet Take 1 tablet (40 mg total) by mouth at bedtime.  30 tablet  6  . spironolactone (ALDACTONE) 25 MG tablet Take 0.5 tablets (12.5 mg total) by mouth daily.  30 tablet  6   No current facility-administered medications for this visit.    SURGICAL HISTORY:  Past Surgical History  Procedure Laterality Date  . Lung removal, partial  2008    right  . H/o hysterectomy    . Breast biopsy      REVIEW OF SYSTEMS:  A comprehensive review of systems was negative.   PHYSICAL EXAMINATION: General appearance: alert, cooperative and no distress Head: Normocephalic, without obvious abnormality, atraumatic Neck: no adenopathy, no JVD, supple, symmetrical, trachea midline and thyroid not enlarged,  symmetric, no tenderness/mass/nodules Lymph nodes: Cervical, supraclavicular, and axillary nodes normal. Resp: clear to auscultation bilaterally Cardio: regular rate and rhythm, S1, S2 normal, no murmur, click, rub or gallop GI: soft, non-tender; bowel sounds normal; no masses,  no organomegaly Extremities: extremities normal, atraumatic, no cyanosis or edema  ECOG PERFORMANCE STATUS: 0 - Asymptomatic  Blood pressure 111/55, pulse 62, temperature 97.7 F (36.5 C), temperature source Oral, resp. rate 17, height 5' 6.5" (1.689 m), weight 140 lb (63.504 kg), SpO2 94.00%.  LABORATORY DATA: Lab Results  Component Value Date   WBC 6.8 04/30/2014   HGB 12.9 04/30/2014   HCT 39.3 04/30/2014    MCV 95.5 04/30/2014   PLT 213 04/30/2014      Chemistry      Component Value Date/Time   NA 139 04/30/2014 1312   NA 137 02/27/2014 1437   NA 134 04/06/2011 1312   K 4.0 04/30/2014 1312   K 3.8 02/27/2014 1437   K 4.5 04/06/2011 1312   CL 99 02/27/2014 1437   CL 102 04/18/2012 1057   CL 98 04/06/2011 1312   CO2 27 04/30/2014 1312   CO2 32 02/27/2014 1437   CO2 26 04/06/2011 1312   BUN 20.7 04/30/2014 1312   BUN 18 02/27/2014 1437   BUN 20 04/06/2011 1312   CREATININE 1.0 04/30/2014 1312   CREATININE 0.9 02/27/2014 1437   CREATININE 0.8 04/06/2011 1312      Component Value Date/Time   CALCIUM 9.7 04/30/2014 1312   CALCIUM 9.3 02/27/2014 1437   CALCIUM 9.4 04/06/2011 1312   ALKPHOS 96 04/30/2014 1312   ALKPHOS 109 07/08/2012 1902   ALKPHOS 98* 04/06/2011 1312   AST 18 04/30/2014 1312   AST 23 07/08/2012 1902   AST 23 04/06/2011 1312   ALT 16 04/30/2014 1312   ALT 18 07/08/2012 1902   ALT 22 04/06/2011 1312   BILITOT 0.41 04/30/2014 1312   BILITOT 0.3 07/08/2012 1902   BILITOT 0.60 04/06/2011 1312       RADIOGRAPHIC STUDIES: Ct Chest Wo Contrast  04/30/2014   CLINICAL DATA:  Lung cancer.  Restaging.  EXAM: CT CHEST WITHOUT CONTRAST  TECHNIQUE: Multidetector CT imaging of the chest was performed following the standard protocol without IV contrast.  COMPARISON:  04/11/2013  FINDINGS: Mediastinum: The heart size is normal. There is no pericardial effusion. Calcified atherosclerotic plaque involves the thoracic aorta as well as the LAD, left circumflex and RCA coronary arteries. 8 mm right paratracheal lymph node is unchanged. No enlarged mediastinal or hilar lymph nodes.  Lungs/Pleura: No pleural effusion. Moderate changes of centrilobular emphysema. Postsurgical changes identified within the right lower lobe centrally. Left apical nodule density measures 1.5 cm, image 5/ series 5. Unchanged from previous exam. Stable nodular opacity within the superior segment of left lower lobe measuring 1.1 cm, image number  27/series 5. Right upper lobe nodule measured 5 mm, image 19/series 5. No new or enlarging pulmonary nodules or masses.  Upper abdomen: The visualized portions of the liver and gallbladder are unremarkable. The visualized portions of the spleen and adrenal glands are also normal.  Musculoskeletal: There is a scoliosis deformity involving the thoracic spine. No aggressive lytic or sclerotic bone lesions identified.  IMPRESSION: 1. No acute findings and no significant change when compared with 04/11/2013. 2. Previously referenced left apical nodular density is unchanged from previous exam.   Electronically Signed   By: Kerby Moors M.D.   On: 04/30/2014 16:47   ASSESSMENT AND PLAN: This is  a very pleasant 73 years old white female with history of stage IA non-small cell lung cancer status post right lower lobe superior segmentectomy with lymph node dissection in January of 2008 and has been observation since that time with no evidence for disease recurrence. She has stable left lung apical lesion. I discussed the scan results with the patient. She has been observation for the last 8 years. I recommended for her to continue on observation. I gave her the option of continuing routine followup visit with her primary care physician versus enrollment in the survivorship program at the [facility]. The patient would like to continue her followup visit at the [facility] and I will arrange for her an appointment with the survivorship provider in one year. She was advised to call immediately if she has any concerning symptoms in the interval.  The patient voices understanding of current disease status and treatment options and is in agreement with the current care plan.  All questions were answered. The patient knows to call the clinic with any problems, questions or concerns. We can certainly see the patient much sooner if necessary.  Disclaimer: This note was dictated with voice recognition software.  Similar sounding words can inadvertently be transcribed and may be missed upon review.

## 2014-05-15 NOTE — Telephone Encounter (Signed)
gv and printed appt sched and avs for pt for OCT 2016 °

## 2014-06-13 ENCOUNTER — Other Ambulatory Visit: Payer: Self-pay | Admitting: Cardiovascular Disease

## 2014-07-25 ENCOUNTER — Other Ambulatory Visit: Payer: Self-pay | Admitting: Cardiovascular Disease

## 2014-08-27 ENCOUNTER — Other Ambulatory Visit: Payer: Self-pay | Admitting: Cardiovascular Disease

## 2014-10-18 ENCOUNTER — Other Ambulatory Visit: Payer: Self-pay | Admitting: Cardiovascular Disease

## 2014-10-23 ENCOUNTER — Ambulatory Visit (INDEPENDENT_AMBULATORY_CARE_PROVIDER_SITE_OTHER): Payer: PPO | Admitting: Cardiovascular Disease

## 2014-10-23 ENCOUNTER — Other Ambulatory Visit: Payer: Self-pay | Admitting: Cardiovascular Disease

## 2014-10-23 ENCOUNTER — Encounter: Payer: Self-pay | Admitting: Cardiovascular Disease

## 2014-10-23 VITALS — BP 115/68 | HR 55 | Ht 66.5 in | Wt 133.8 lb

## 2014-10-23 DIAGNOSIS — I10 Essential (primary) hypertension: Secondary | ICD-10-CM | POA: Diagnosis not present

## 2014-10-23 DIAGNOSIS — I25119 Atherosclerotic heart disease of native coronary artery with unspecified angina pectoris: Secondary | ICD-10-CM

## 2014-10-23 DIAGNOSIS — R0789 Other chest pain: Secondary | ICD-10-CM | POA: Diagnosis not present

## 2014-10-23 DIAGNOSIS — E785 Hyperlipidemia, unspecified: Secondary | ICD-10-CM

## 2014-10-23 MED ORDER — ISOSORBIDE MONONITRATE ER 30 MG PO TB24: 30.0000 mg | ORAL_TABLET | Freq: Every day | ORAL | Status: DC

## 2014-10-23 NOTE — Progress Notes (Signed)
Cardiology Office Note   Date:  10/25/2014   ID:  [name], DOB 29-Dec-1940, MRN [id number]  PCP:  Ria Bush, MD  Cardiologist:  Sherren Mocha, MD    Chief Complaint  Patient presents with  . Palpitations     History of Present Illness: [name] is a 74 y.o. female who presents for  Follow-up evaluation.  The patient has coronary artery disease and initially presented with a myocardial infarction in 2010. Her left circumflex was occluded at that time she underwent primary PCI with a bare-metal stent. She's had significant LV dysfunction related to akinesis of the posterolateral myocardium. Her most recent echo estimated an LVEF of 45% in December 2013. She was hospitalized in December 2013 with a right MCA stroke. There were multiple areas of infarction and also evidence of a previous hemorrhagic stroke. She also has a history of non-small cell lung cancer and right lower lobe segmental resection with chronic dyspnea. She has COPD and is followed by Dr. Lamonte Sakai.  She continues to c/o shortness of breath. She had chest pain about 6 months ago when her brother passed away. She had anginal symptoms when she walked into the hospital during that time to visit him. Chest pain resolved with rest. States that these episodes were precipitated by shortness of breath. She has a lot of trouble with her breathing during the Summer months. No leg swelling, orthopnea, or PND.  She has not had any angina recently, but has slowed down a little bit with her physical activities.  Past Medical History  Diagnosis Date  . HTN (hypertension)   . Arthritis   . Macular degeneration   . Myocardial infarction     Acute myocardial infarction 2010 - treated with BMS of LCx. LVEF 50%, with subsequent CHF  . Lung cancer dx'd 07/2006    Lung CA, s/p resection, followed by Dr Earlie Server  . Stroke     Past Surgical History  Procedure Laterality Date  . Lung removal, partial  2008    right  . H/o  hysterectomy    . Breast biopsy      Current Outpatient Prescriptions  Medication Sig Dispense Refill  . LOTEMAX 0.5 % ophthalmic suspension Place 1 drop into both eyes daily.     Marland Kitchen aspirin 325 MG tablet Take 1 tablet (325 mg total) by mouth daily.    . Calcium Carbonate-Vitamin D (CALCIUM-VITAMIN D) 500-200 MG-UNIT per tablet Take 2 tablets by mouth daily.    . carvedilol (COREG) 3.125 MG tablet TAKE ONE TABLET BY MOUTH TWICE A DAY WITH FOOD 60 tablet 0  . fluticasone (FLONASE) 50 MCG/ACT nasal spray INHALE TWO (2) SPRAYS INTO THE NOSE DAILY AS DIRECTED 16 g 1  . furosemide (LASIX) 40 MG tablet TAKE 1 TABLET BY MOUTH DAILY 30 tablet 6  . isosorbide mononitrate (IMDUR) 30 MG 24 hr tablet Take 1 tablet (30 mg total) by mouth daily. 30 tablet 11  . losartan (COZAAR) 50 MG tablet TAKE 1 TABLET BY MOUTH DAILY 30 tablet 3  . nitroGLYCERIN (NITROSTAT) 0.4 MG SL tablet Place 1 tablet (0.4 mg total) under the tongue every 5 (five) minutes as needed for chest pain. 25 tablet 3  . omeprazole (PRILOSEC) 20 MG capsule TAKE ONE CAPSULE BY MOUTH DAILY 30 capsule 1  . simvastatin (ZOCOR) 40 MG tablet TAKE ONE TABLET BY MOUTH EVERY NIGHT AT BEDTIME 30 tablet 1  . spironolactone (ALDACTONE) 25 MG tablet TAKE 1/2 TABLET (12.5MG) BY MOUTH  ONCE DAILY 30 tablet 3   No current facility-administered medications for this visit.    Allergies:   Review of patient's allergies indicates no known allergies.   Social History:  The patient  reports that she quit smoking about 8 years ago. Her smoking use included Cigarettes. She has a 80 pack-year smoking history. She has never used smokeless tobacco. She reports that she does not drink alcohol.   Family History:  The patient's family history includes Breast cancer in her sister; CAD in her father; Coronary artery disease in her mother; Heart attack in her mother; Hypertension in her mother; Stroke in her father and mother.    ROS:  Please see the history of present  illness.  Otherwise, review of systems is positive for decreased appetite, visual changes, cough, DOE, easy bruising, and palpitations. .  All other systems are reviewed and negative.    PHYSICAL EXAM: VS:  BP 115/68 mmHg  Pulse 55  Ht 5' 6.5" (1.689 m)  Wt 133 lb 12.8 oz (60.691 kg)  BMI 21.27 kg/m2 , BMI Body mass index is 21.27 kg/(m^2). GEN: Well nourished, well developed, in no acute distress HEENT: normal Neck: no JVD, no masses. No carotid bruits Cardiac: RRR without murmur or gallop                Respiratory:  clear to auscultation bilaterally, normal work of breathing. Prolonged expiratory phase. GI: soft, nontender, nondistended, + BS MS: no deformity or atrophy Ext: no pretibial edema, pedal pulses 2+= bilaterally Skin: warm and dry, no rash Neuro:  Strength and sensation are intact Psych: euthymic mood, full affect  EKG:  EKG is ordered today. The ekg ordered today shows  Sinus bradycardia 55 bpm, nonspecific ST and T wave abnormality unchanged his tracing  Recent Labs: 04/30/2014: ALT 16; BUN 20.7; Creatinine 1.0; Hemoglobin 12.9; Platelets 213; Potassium 4.0; Sodium 139   Lipid Panel     Component Value Date/Time   CHOL 132 07/09/2012 0608   TRIG 86 07/09/2012 0608   HDL 66 07/09/2012 0608   CHOLHDL 2.0 07/09/2012 0608   VLDL 17 07/09/2012 0608   LDLCALC 49 07/09/2012 0608      Wt Readings from Last 3 Encounters:  10/23/14 133 lb 12.8 oz (60.691 kg)  05/15/14 140 lb (63.504 kg)  02/27/14 141 lb (63.957 kg)    ASSESSMENT AND PLAN: 1. Chronic combined systolic and diastolic heart failure. She has NYHA functional class 2-3 symptoms. She will continue on her current medical program which includes low doses of carvedilol, losartan, as well as isosorbide and spironolactone.  We'll check a metabolic panel with her lab work.  2. Coronary artery disease, native vessel.  The patient reports CCS last 2 anginal symptoms. Recommended that we increase isosorbide to 30  mg daily. She will otherwise continue her current medical program. We discussed the option of cardiac catheterization, but she was not keen on this. She will let me now if her symptoms progress. I will plan on seeing her back in 6 months.  3. Hypertension. Blood pressure is under ideal control on multidrug therapy as outlined.  4. Hyperlipidemia. Patient takes simvastatin.  Will check lipids and LFTs.    Current medicines are reviewed with the patient today.  The patient does not have concerns regarding medicines.  The following changes have been made:   Increase Imdur to 30 mg daily  Labs/ tests ordered today include:   Orders Placed This Encounter  Procedures  . Comp Met (  CMET)  . Lipid Profile  . EKG 12-Lead    Disposition:   FU 6 months  Signed, Sherren Mocha, MD  10/25/2014 12:24 AM    [facility] [address], [place],   [address] Phone: [phone]; Fax: [phone]

## 2014-10-23 NOTE — Patient Instructions (Signed)
Your physician has recommended you make the following change in your medication:   INCREASE Imdur to 30 mg by mouth daily  Your physician recommends that you return for a FASTING lipid profile and CMET. Nothing to eat or drink after midnight. Lab opens at 7:30 am.  Your physician wants you to follow-up in: 6 months with Dr. Burt Knack. You will receive a reminder letter in the mail two months in advance. If you don't receive a letter, please call our office to schedule the follow-up appointment.

## 2014-10-26 ENCOUNTER — Other Ambulatory Visit (INDEPENDENT_AMBULATORY_CARE_PROVIDER_SITE_OTHER): Payer: PPO | Admitting: *Deleted

## 2014-10-26 DIAGNOSIS — E785 Hyperlipidemia, unspecified: Secondary | ICD-10-CM | POA: Diagnosis not present

## 2014-10-26 LAB — COMPREHENSIVE METABOLIC PANEL
ALBUMIN: 3.8 g/dL (ref 3.5–5.2)
ALK PHOS: 91 U/L (ref 39–117)
ALT: 13 U/L (ref 0–35)
AST: 18 U/L (ref 0–37)
BUN: 21 mg/dL (ref 6–23)
CHLORIDE: 101 meq/L (ref 96–112)
CO2: 32 meq/L (ref 19–32)
Calcium: 9.9 mg/dL (ref 8.4–10.5)
Creatinine, Ser: 1 mg/dL (ref 0.40–1.20)
GFR: 57.62 mL/min — AB (ref >60.00)
GLUCOSE: 88 mg/dL (ref 70–99)
Potassium: 3.9 mEq/L (ref 3.5–5.1)
SODIUM: 137 meq/L (ref 135–145)
TOTAL PROTEIN: 7.1 g/dL (ref 6.0–8.3)
Total Bilirubin: 0.5 mg/dL (ref 0.2–1.2)

## 2014-10-26 LAB — LIPID PANEL
Cholesterol: 161 mg/dL (ref 0–200)
HDL: 52.2 mg/dL (ref >39.00)
LDL Cholesterol: 92 mg/dL (ref 0–99)
NonHDL: 108.8
Total CHOL/HDL Ratio: 3
Triglycerides: 86 mg/dL (ref 0.0–149.0)
VLDL: 17.2 mg/dL (ref 0.0–40.0)

## 2014-11-20 ENCOUNTER — Other Ambulatory Visit: Payer: Self-pay | Admitting: Cardiovascular Disease

## 2014-11-27 ENCOUNTER — Other Ambulatory Visit: Payer: Self-pay | Admitting: Cardiovascular Disease

## 2014-12-04 ENCOUNTER — Ambulatory Visit (INDEPENDENT_AMBULATORY_CARE_PROVIDER_SITE_OTHER): Payer: PPO | Admitting: Emergency Medicine

## 2014-12-04 ENCOUNTER — Encounter: Payer: Self-pay | Admitting: Emergency Medicine

## 2014-12-04 VITALS — BP 110/72 | HR 56 | Ht 66.0 in | Wt 130.0 lb

## 2014-12-04 DIAGNOSIS — C3431 Malignant neoplasm of lower lobe, right bronchus or lung: Secondary | ICD-10-CM

## 2014-12-04 DIAGNOSIS — J449 Chronic obstructive pulmonary disease, unspecified: Secondary | ICD-10-CM

## 2014-12-04 NOTE — Patient Instructions (Signed)
We will try using Stiolto 2 puffs daily to see if you benefit.  Please call our office in 2 weeks so we can decide whether to continue and send this medication to your pharmacy  Please follow with Dr Lamonte Sakai in 1 month to assess your status.

## 2014-12-04 NOTE — Progress Notes (Signed)
Subjective:    Patient ID: [name], female    DOB: 1941/02/13, 74 y.o.   MRN: [id number]  HPI 74 yo woman, former smoker 105 pk-yrs, also hx CAD/MI with ischemic cardiomyopathy, R MCA CVA, HTN, Stage 1A adenoCA s/p RLL superior segmentectomy '08. She has been having dyspnea for > 1 year, some cough that is productive of white mucous. She has hoarse voice frequently, not necessarily related to cough. Not clear that it relates in time to symbicort.   Part of her eval has included CXR that showed a 1.4x0.6cm  L apical nodule, confirmed by CT scan 09/28/12. PET on 10/07/12 showed the nodule was cold.   She is on Symbicort qam only for over a year, feels that it helps her.   PFT 09/02/12 >> moderately severe AFL without BD response, normal volumes, decreased diffusion.   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 PET negative 1.4x0.6cm  L apical nodule 10/07/12. We stopped symbicort, started spiriva last time.  She doesn't think the SOB has changed any since starting. She is still having some mucous but better. Still has cough, esp in the am. She doesn't have a SABA. She is due for CT scan chest w Dr Julien Nordmann in 9/'14. Not on nasal steroid or anti-histamine.   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 PET negative 1.4x0.6cm  L apical nodule 10/07/12. Stable by CT scan 04/10/13.  Had been doing well until this week when she got sore throat, dry cough. Using spiriva, albuterol prn > about once a day.  She has flonase at home but hasn't used it yet.   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 We are following PET negative L apical nodule, repeat Ct scan due in September. She stopped her spiriva due to some associated SOB, ? Due to UA  effects. She tells me that she had a possible rxn to fosamax, caused dyspnea and ? Throat swelling. She feels that her breathing is better. She uses 1 puff her rescue SABA about once a day. Doesn't feel that she misses the spiriva.   ROV 12/04/14 -- follow-up visit for COPD and an abnormal CT scan with a left apical nodule. She also has a history of adenocarcinoma status post a right lower lobe superior segmentectomy in 2008.  Her most recent CT scan of the chest was performed in October 2015 and was stable without any new findings. The left apical nodule was unchanged.  She has been having more breathing trouble since the weather has gotten warmer.  She has heard some wheeze, some allergy sx.  She states that she had associated CP with exertion. She could not tolerate fluticasone nasal spray - said it made her breathing worse. She is confused / confusing regarding which inhaled meds she has been on, when, and how she has responded to them. I believe she has tried albuterol for these sx without good response.    Review of Systems  Constitutional: Negative for fever, chills and unexpected weight change.  HENT: Positive for congestion, postnasal drip and rhinorrhea. Negative for dental problem, ear pain, nosebleeds, sinus pressure, sneezing, sore throat and trouble swallowing.   Eyes: Negative for redness and itching.  Respiratory: Positive for cough. Negative for chest  tightness, shortness of breath and wheezing.   Cardiovascular: Negative for palpitations and leg swelling.  Gastrointestinal: Negative for nausea and vomiting.  Genitourinary: Negative for dysuria.  Musculoskeletal: Negative for joint swelling.  Skin: Negative for rash.  Neurological: Negative for headaches.  Hematological: Does not bruise/bleed easily.  Psychiatric/Behavioral: Negative for dysphoric mood. The patient is not nervous/anxious.       Objective:   Physical Exam Filed Vitals:   12/04/14 1510  BP: 110/72  Pulse: 56   Height: '5\' 6"'$  (1.676 m)  Weight: 130 lb (58.968 kg)  SpO2: 95%   Gen: Pleasant, well-nourished, in no distress,  normal affect  ENT: No lesions,  mouth clear,  oropharynx clear, no postnasal drip, hoarse voice  Neck: No JVD, no TMG, no carotid bruits  Lungs: No use of accessory muscles, very distant, clear without rales or rhonchi  Cardiovascular: RRR, heart sounds normal, no murmur or gallops, no peripheral edema  Musculoskeletal: No deformities, no cyanosis or clubbing  Neuro: alert, non focal  Skin: Warm, no lesions or rashes  04/2014 --  COMPARISON: 04/11/2013  FINDINGS: Mediastinum: The heart size is normal. There is no pericardial effusion. Calcified atherosclerotic plaque involves the thoracic aorta as well as the LAD, left circumflex and RCA coronary arteries. 8 mm right paratracheal lymph node is unchanged. No enlarged mediastinal or hilar lymph nodes.  Lungs/Pleura: No pleural effusion. Moderate changes of centrilobular emphysema. Postsurgical changes identified within the right lower lobe centrally. Left apical nodule density measures 1.5 cm, image 5/ series 5. Unchanged from previous exam. Stable nodular opacity within the superior segment of left lower lobe measuring 1.1 cm, image number 27/series 5. Right upper lobe nodule measured 5 mm, image 19/series 5. No new or enlarging pulmonary nodules or masses.  Upper abdomen: The visualized portions of the liver and gallbladder are unremarkable. The visualized portions of the spleen and adrenal glands are also normal.  Musculoskeletal: There is a scoliosis deformity involving the thoracic spine. No aggressive lytic or sclerotic bone lesions identified.  IMPRESSION: 1. No acute findings and no significant change when compared with 04/11/2013. 2. Previously referenced left apical nodular density is unchanged from previous exam.     Assessment & Plan:  COPD (chronic obstructive pulmonary disease) To  date she has not clearly benefited from bronchodilators although her pulmonary function testing does suggest that she should be treated. She also has heart disease and rightly questions whether her dyspnea and other symptoms could be multifactorial. I would like to try Stiolto since it is in Respimat form and it may be easier for her to take adequately deliver. If she benefits that we will continue this. She will call us to let us know. I will follow-up with her in one month to reassess   Cancer of lower lobe of right lung She has a history of adenocarcinoma status post right lower lobes appear segmentectomy. She also has a left upper lobe PET negative apical nodule that we have been following with serial CT scans. Her most recent CT scan in October 2015 shows that the nodule is unchanged and there is no evidence of a recurrence. Plan to repeat her scan annually

## 2014-12-04 NOTE — Assessment & Plan Note (Signed)
To date she has not clearly benefited from bronchodilators although her pulmonary function testing does suggest that she should be treated. She also has heart disease and rightly questions whether her dyspnea and other symptoms could be multifactorial. I would like to try Stiolto since it is in Respimat form and it may be easier for her to take adequately deliver. If she benefits that we will continue this. She will call us to let us know. I will follow-up with her in one month to reassess

## 2014-12-04 NOTE — Assessment & Plan Note (Signed)
She has a history of adenocarcinoma status post right lower lobes appear segmentectomy. She also has a left upper lobe PET negative apical nodule that we have been following with serial CT scans. Her most recent CT scan in October 2015 shows that the nodule is unchanged and there is no evidence of a recurrence. Plan to repeat her scan annually

## 2014-12-10 ENCOUNTER — Other Ambulatory Visit: Payer: Self-pay | Admitting: Cardiovascular Disease

## 2014-12-10 ENCOUNTER — Telehealth: Payer: Self-pay | Admitting: Emergency Medicine

## 2014-12-10 NOTE — Telephone Encounter (Signed)
Spoke with the pt  She is asking how you can tell that [place] still has medicine in it  I advised her to look at the counter on the back of the inhaler  She verbalized understanding Nothing further needed

## 2014-12-18 ENCOUNTER — Telehealth: Payer: Self-pay | Admitting: Emergency Medicine

## 2014-12-18 MED ORDER — TIOTROPIUM BROMIDE-OLODATEROL 2.5-2.5 MCG/ACT IN AERS: 2.0000 | INHALATION_SPRAY | Freq: Every day | RESPIRATORY_TRACT | Status: DC

## 2014-12-18 NOTE — Telephone Encounter (Signed)
thanks

## 2014-12-18 NOTE — Telephone Encounter (Signed)
Per 12/04/14 OV:  Patient Instructions       We will try using Stiolto 2 puffs daily to see if you benefit.   Please call our office in 2 weeks so we can decide whether to continue and send this medication to your pharmacy   Please follow with Dr Lamonte Sakai in 1 month to assess your status.    --  Called spoke with pt. She reports the stiolto has improved her breathing. She wants RX sent in. I have done so. Will forward to RB as an [place]

## 2015-01-01 ENCOUNTER — Ambulatory Visit (INDEPENDENT_AMBULATORY_CARE_PROVIDER_SITE_OTHER): Payer: PPO | Admitting: Family Medicine

## 2015-01-01 ENCOUNTER — Encounter: Payer: Self-pay | Admitting: Family Medicine

## 2015-01-01 VITALS — BP 116/64 | HR 56 | Temp 97.8°F | Ht 66.0 in | Wt 130.2 lb

## 2015-01-01 DIAGNOSIS — E785 Hyperlipidemia, unspecified: Secondary | ICD-10-CM | POA: Diagnosis not present

## 2015-01-01 DIAGNOSIS — I10 Essential (primary) hypertension: Secondary | ICD-10-CM | POA: Diagnosis not present

## 2015-01-01 DIAGNOSIS — J449 Chronic obstructive pulmonary disease, unspecified: Secondary | ICD-10-CM

## 2015-01-01 DIAGNOSIS — C3431 Malignant neoplasm of lower lobe, right bronchus or lung: Secondary | ICD-10-CM

## 2015-01-01 DIAGNOSIS — H3530 Unspecified macular degeneration: Secondary | ICD-10-CM | POA: Diagnosis not present

## 2015-01-01 DIAGNOSIS — I255 Ischemic cardiomyopathy: Secondary | ICD-10-CM

## 2015-01-01 DIAGNOSIS — I5042 Chronic combined systolic (congestive) and diastolic (congestive) heart failure: Secondary | ICD-10-CM

## 2015-01-01 DIAGNOSIS — K219 Gastro-esophageal reflux disease without esophagitis: Secondary | ICD-10-CM

## 2015-01-01 NOTE — Assessment & Plan Note (Signed)
Seems euvolemic today.

## 2015-01-01 NOTE — Assessment & Plan Note (Signed)
Actually noticing improvement on stiolto respimat. Continue this med.

## 2015-01-01 NOTE — Progress Notes (Signed)
BP 116/64 mmHg  Pulse 56  Temp(Src) 97.8 F (36.6 C) (Oral)  Ht $R'5\' 6"'gF$  (1.676 m)  Wt 130 lb 4 oz (59.081 kg)  BMI 21.03 kg/m2   CC: new pt to establish care  Subjective:    Patient ID: [name], female    DOB: 12-02-1940, 74 y.o.   MRN: [id number]  HPI: [name] is a 74 y.o. female presenting on 01/01/2015 for Establish Care   Prior saw PCP Dr Letta Kocher.  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 Followed by oncology, pulmonology and cardiology. Heart meds are through cardiology.   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   COPD - started on stiolto respimat which has been helpful! Very happy about this.   Preventative: About time  Relevant past medical, surgical, family and social history reviewed and updated as indicated. Interim medical history since our last visit reviewed. Allergies and medications reviewed and updated. Current Outpatient Prescriptions on File Prior to Visit  Medication Sig  . aspirin 325 MG tablet Take 1 tablet (325 mg total) by mouth daily.  . Calcium Carbonate-Vitamin D (CALCIUM-VITAMIN D) 500-200 MG-UNIT per tablet Take 2 tablets by mouth daily.  . carvedilol (COREG) 3.125 MG tablet TAKE 1 TABLET BY MOUTH TWICE A DAY WITH FOOD  . furosemide (LASIX) 40 MG tablet TAKE 1 TABLET BY MOUTH DAILY  . isosorbide mononitrate (IMDUR) 30 MG 24 hr tablet Take 1 tablet (30 mg total) by mouth daily.  Marland Kitchen losartan (COZAAR) 50 MG tablet TAKE 1 TABLET BY MOUTH DAILY  . LOTEMAX 0.5 % ophthalmic suspension Place 1 drop into both eyes daily.   . nitroGLYCERIN (NITROSTAT) 0.4 MG SL tablet Place 1 tablet (0.4 mg total) under the tongue every 5 (five) minutes as needed for chest pain.  Marland Kitchen omeprazole (PRILOSEC) 20 MG capsule Take 1 capsule (20 mg total) by mouth daily.  . simvastatin (ZOCOR) 40 MG tablet TAKE ONE TABLET BY MOUTH EVERY NIGHT AT BEDTIME  .  spironolactone (ALDACTONE) 25 MG tablet TAKE 1/2 TABLET (12.$RemoveBefore'5MG'[id number]$ ) BY MOUTH ONCE DAILY  . Tiotropium Bromide-Olodaterol (STIOLTO RESPIMAT) 2.5-2.5 MCG/ACT AERS Inhale 2 puffs into the lungs daily.   No current facility-administered medications on file prior to visit.    Review of Systems Per HPI unless specifically indicated above     Objective:    BP 116/64 mmHg  Pulse 56  Temp(Src) 97.8 F (36.6 C) (Oral)  Ht $R'5\' 6"'qZ$  (1.676 m)  Wt 130 lb 4 oz (59.081 kg)  BMI 21.03 kg/m2  Wt Readings from Last 3 Encounters:  01/01/15 130 lb 4 oz (59.081 kg)  12/04/14 130 lb (58.968 kg)  10/23/14 133 lb 12.8 oz (60.691 kg)    Physical Exam  Constitutional: She appears well-developed and well-nourished. No distress.  HENT:  Mouth/Throat: Oropharynx is clear and moist. No oropharyngeal exudate.  Eyes: Conjunctivae and EOM are normal. Pupils are equal, round, and reactive to light.  Neck: Normal range of motion. Neck supple. No thyromegaly present.  Cardiovascular: Normal rate, regular rhythm, normal heart sounds and intact distal pulses.   No murmur heard. Pulmonary/Chest: Effort normal and breath sounds normal. No respiratory distress. She has no wheezes. She has no rales.  Mildly coarse  Musculoskeletal: She exhibits no edema.  Lymphadenopathy:    She has no cervical adenopathy.  Skin: Skin is warm and dry. No rash noted.  Nursing  note and vitals reviewed.  Results for orders placed or performed in visit on 10/26/14  Comp Met (CMET)  Result Value Ref Range   Sodium 137 135 - 145 mEq/L   Potassium 3.9 3.5 - 5.1 mEq/L   Chloride 101 96 - 112 mEq/L   CO2 32 19 - 32 mEq/L   Glucose, Bld 88 70 - 99 mg/dL   BUN 21 6 - 23 mg/dL   Creatinine, Ser 1.00 0.40 - 1.20 mg/dL   Total Bilirubin 0.5 0.2 - 1.2 mg/dL   Alkaline Phosphatase 91 39 - 117 U/L   AST 18 0 - 37 U/L   ALT 13 0 - 35 U/L   Total Protein 7.1 6.0 - 8.3 g/dL   Albumin 3.8 3.5 - 5.2 g/dL   Calcium 9.9 8.4 - 10.5 mg/dL   GFR  57.62 (L) >60.00 mL/min  Lipid Profile  Result Value Ref Range   Cholesterol 161 0 - 200 mg/dL   Triglycerides 86.0 0.0 - 149.0 mg/dL   HDL 52.20 >39.00 mg/dL   VLDL 17.2 0.0 - 40.0 mg/dL   LDL Cholesterol 92 0 - 99 mg/dL   Total CHOL/HDL Ratio 3    NonHDL 108.80       Assessment & Plan:   Problem List Items Addressed This Visit    Cancer of lower lobe of right lung    Followed by pulm and onc.      Chronic combined systolic and diastolic heart failure    Seems euvolemic today.      COPD (chronic obstructive pulmonary disease) - Primary    Actually noticing improvement on stiolto respimat. Continue this med.      Essential hypertension    Chronic, stable. Continue current regimen.      GERD    Continue low dose PPI      HLD (hyperlipidemia)    Check FLP at next visit.      Ischemic cardiomyopathy    Continue f/u wth pulm.      Macular degeneration (senile) of retina    Followed by ophthalmologist.          Follow up plan: Return in about 3 months (around 04/03/2015), or as needed, for medicare wellness.

## 2015-01-01 NOTE — Assessment & Plan Note (Signed)
Continue f/u wth pulm.

## 2015-01-01 NOTE — Assessment & Plan Note (Signed)
Followed by pulm and onc.

## 2015-01-01 NOTE — Assessment & Plan Note (Signed)
Check FLP at next visit.

## 2015-01-01 NOTE — Assessment & Plan Note (Signed)
Continue low dose PPI

## 2015-01-01 NOTE — Assessment & Plan Note (Signed)
Followed by ophthalmologist.

## 2015-01-01 NOTE — Patient Instructions (Signed)
Good to see you today, call us with questions. Return as needed or in 3-4 months for medicare wellness visit Continue current medications.

## 2015-01-01 NOTE — Assessment & Plan Note (Signed)
Chronic, stable. Continue current regimen. 

## 2015-01-01 NOTE — Progress Notes (Signed)
Pre visit review using our clinic review tool, if applicable. No additional management support is needed unless otherwise documented below in the visit note. 

## 2015-01-11 ENCOUNTER — Ambulatory Visit (INDEPENDENT_AMBULATORY_CARE_PROVIDER_SITE_OTHER): Payer: PPO | Admitting: Emergency Medicine

## 2015-01-11 ENCOUNTER — Encounter: Payer: Self-pay | Admitting: Emergency Medicine

## 2015-01-11 VITALS — BP 92/46 | HR 61 | Ht 66.0 in | Wt 129.0 lb

## 2015-01-11 DIAGNOSIS — J449 Chronic obstructive pulmonary disease, unspecified: Secondary | ICD-10-CM | POA: Diagnosis not present

## 2015-01-11 MED ORDER — UMECLIDINIUM-VILANTEROL 62.5-25 MCG/INH IN AEPB: 1.0000 | INHALATION_SPRAY | Freq: Every day | RESPIRATORY_TRACT | Status: DC

## 2015-01-11 NOTE — Progress Notes (Signed)
Subjective:    Patient ID: [name], female    DOB: Dec 26, 1940, 74 y.o.   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 She has been having dyspnea for > 1 year, some cough that is productive of white mucous. She has hoarse voice frequently, not necessarily related to cough. Not clear that it relates in time to symbicort.   Part of her eval has included CXR that showed a 1.4x0.6cm  L apical nodule, confirmed by CT scan 09/28/12. PET on 10/07/12 showed the nodule was cold.   She is on Symbicort qam only for over a year, feels that it helps her.   PFT 09/02/12 >> moderately severe AFL without BD response, normal volumes, decreased diffusion.   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 PET negative 1.4x0.6cm  L apical nodule 10/07/12. We stopped symbicort, started spiriva last time.  She doesn't think the SOB has changed any since starting. She is still having some mucous but better. Still has cough, esp in the am. She doesn't have a SABA. She is due for CT scan chest w Dr Julien Nordmann in 9/'14. Not on nasal steroid or anti-histamine.   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 PET negative 1.4x0.6cm  L apical nodule 10/07/12. Stable by CT scan 04/10/13.  Had been doing well until this week when she got sore throat, dry cough. Using spiriva, albuterol prn > about once a day.  She has flonase at home but hasn't used it yet.   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 We are following PET negative L apical nodule, repeat Ct scan due in September. She stopped her spiriva due to some associated SOB, ? Due to UA  effects. She tells me that she had a possible rxn to fosamax, caused dyspnea and ? Throat swelling. She feels that her breathing is better. She uses 1 puff her rescue SABA about once a day. Doesn't feel that she misses the spiriva.   ROV 12/04/14 -- follow-up visit for COPD and an abnormal CT scan with a left apical nodule. She also has a history of adenocarcinoma status post a right lower lobe superior segmentectomy in 2008.  Her most recent CT scan of the chest was performed in October 2015 and was stable without any new findings. The left apical nodule was unchanged.  She has been having more breathing trouble since the weather has gotten warmer.  She has heard some wheeze, some allergy sx.  She states that she had associated CP with exertion. She could not tolerate fluticasone nasal spray - said it made her breathing worse. She is confused / confusing regarding which inhaled meds she has been on, when, and how she has responded to them. I believe she has tried albuterol for these sx without good response.   ROV 01/11/15 -- follow-up visit for history of COPD, adenocarcinoma of the lung status post right lower lobe superior segmentectomy, chest CT with a stable left apical nodule. She is due for another CT chest in October 2016. Last month I asked her to start Stiolto to see if she would benefit. She started  the med, developed diarrhea that has been happening since the [employer]. She decreased to qod, still has the diarrhea on the days when she takes it. She started flonase but it was ineffective. g   Review of Systems  Constitutional: Negative for fever, chills and unexpected weight change.  HENT: Positive for congestion, postnasal drip and rhinorrhea. Negative for dental problem, ear pain, nosebleeds, sinus pressure, sneezing, sore throat and trouble swallowing.   Eyes: Negative for redness and itching.  Respiratory: Positive for cough. Negative for chest tightness, shortness of breath and wheezing.    Cardiovascular: Negative for palpitations and leg swelling.  Gastrointestinal: Negative for nausea and vomiting.  Genitourinary: Negative for dysuria.  Musculoskeletal: Negative for joint swelling.  Skin: Negative for rash.  Neurological: Negative for headaches.  Hematological: Does not bruise/bleed easily.  Psychiatric/Behavioral: Negative for dysphoric mood. The patient is not nervous/anxious.       Objective:   Physical Exam Filed Vitals:   01/11/15 1342  BP: 92/46  Pulse: 61  Height: '5\' 6"'$  (1.676 m)  Weight: 129 lb (58.514 kg)  SpO2: 96%   Gen: Pleasant, well-nourished, in no distress,  normal affect  ENT: No lesions,  mouth clear,  oropharynx clear, no postnasal drip, hoarse voice  Neck: No JVD, no TMG, no carotid bruits  Lungs: No use of accessory muscles, very distant, clear without rales or rhonchi  Cardiovascular: RRR, heart sounds normal, no murmur or gallops, no peripheral edema  Musculoskeletal: No deformities, no cyanosis or clubbing  Neuro: alert, non focal  Skin: Warm, no lesions or rashes  04/2014 --  COMPARISON: 04/11/2013  FINDINGS: Mediastinum: The heart size is normal. There is no pericardial effusion. Calcified atherosclerotic plaque involves the thoracic aorta as well as the LAD, left circumflex and RCA coronary arteries. 8 mm right paratracheal lymph node is unchanged. No enlarged mediastinal or hilar lymph nodes.  Lungs/Pleura: No pleural effusion. Moderate changes of centrilobular emphysema. Postsurgical changes identified within the right lower lobe centrally. Left apical nodule density measures 1.5 cm, image 5/ series 5. Unchanged from previous exam. Stable nodular opacity within the superior segment of left lower lobe measuring 1.1 cm, image number 27/series 5. Right upper lobe nodule measured 5 mm, image 19/series 5. No new or enlarging pulmonary nodules or masses.  Upper abdomen: The visualized portions of the liver and  gallbladder are unremarkable. The visualized portions of the spleen and adrenal glands are also normal.  Musculoskeletal: There is a scoliosis deformity involving the thoracic spine. No aggressive lytic or sclerotic bone lesions identified.  IMPRESSION: 1. No acute findings and no significant change when compared with 04/11/2013. 2. Previously referenced left apical nodular density is unchanged from previous exam.     Assessment & Plan:  COPD (chronic obstructive pulmonary disease) She did seem to benefit from the [facility] [place], but she has been having diarrhea since starting the medication. She is even had it when she decreased to every other day. I don't believe this medication is going to be acceptable. I will do a trial of Anoro qd, see if she tolerates and benefits. She will call us to let us know if the diarrhea resolves and if she likes the new medication.

## 2015-01-11 NOTE — Patient Instructions (Signed)
Please stop Stiolto now Do not take any inhaled medication for the next 7-10 days. I want to know if your stomach symptoms and diarrhea resolved. If they do not resolve then please call our office so that we can investigate further If your side effects improve then please start Anoro samples in 10 days (on 01/21/15). One puff once a day.  Call our office to let us know if he benefit from and tolerate the Anoro. If so we will prescribe this medicine for you.  Follow with Dr Lamonte Sakai in 3 months or sooner if you have any problems.

## 2015-01-11 NOTE — Assessment & Plan Note (Signed)
She did seem to benefit from the [employer], but she has been having diarrhea since starting the medication. She is even had it when she decreased to every other day. I don't believe this medication is going to be acceptable. I will do a trial of Anoro qd, see if she tolerates and benefits. She will call us to let us know if the diarrhea resolves and if she likes the new medication.

## 2015-01-14 ENCOUNTER — Telehealth: Payer: Self-pay | Admitting: Emergency Medicine

## 2015-01-14 NOTE — Telephone Encounter (Signed)
Per 01/11/15 OV:   Patient Instructions       Please stop Stiolto now Do not take any inhaled medication for the next 7-10 days. I want to know if your stomach symptoms and diarrhea resolved. If they do not resolve then please call our office so that we can investigate further If your side effects improve then please start Anoro samples in 10 days (on 01/21/15). One puff once a day.   Call our office to let us know if he benefit from and tolerate the Anoro. If so we will prescribe this medicine for you.  Follow with Dr Lamonte Sakai in 3 months or sooner if you have any problems.  ---   lmomtcb x1 for pt Samples of anoro left upfront for pick up

## 2015-01-14 NOTE — Telephone Encounter (Signed)
Patient was calling back.  She called this am and she missed our call back.  Please call her at [phone].

## 2015-01-14 NOTE — Telephone Encounter (Signed)
Pt aware samples of anoro left for pick up. Nothing further needed

## 2015-01-29 ENCOUNTER — Telehealth: Payer: Self-pay | Admitting: *Deleted

## 2015-01-29 NOTE — Telephone Encounter (Signed)
Received PA request from [employer] for Anoro. PA submitted via covermymeds. Key: [id number] PT [id number] PT HAS TRIED STIOLTO AND SPIRIVA IN THE PAST.

## 2015-01-30 NOTE — Telephone Encounter (Signed)
[employer] has approved Anor from 01/30/2015-07/27/2015. Pt/pharmacy notified.

## 2015-02-01 ENCOUNTER — Telehealth: Payer: Self-pay | Admitting: Emergency Medicine

## 2015-02-01 NOTE — Telephone Encounter (Signed)
Called pt. She refused to speak with me and reports she will only speak to Ada. She is aware he is not in the office until Tuesday but still refused to give me any information. Please advise RB thanks

## 2015-02-02 ENCOUNTER — Encounter: Payer: Self-pay | Admitting: Family Medicine

## 2015-02-02 DIAGNOSIS — R7303 Prediabetes: Secondary | ICD-10-CM | POA: Insufficient documentation

## 2015-02-02 DIAGNOSIS — Z8673 Personal history of transient ischemic attack (TIA), and cerebral infarction without residual deficits: Secondary | ICD-10-CM | POA: Insufficient documentation

## 2015-02-02 DIAGNOSIS — E559 Vitamin D deficiency, unspecified: Secondary | ICD-10-CM | POA: Insufficient documentation

## 2015-02-02 DIAGNOSIS — N183 Chronic kidney disease, stage 3 unspecified: Secondary | ICD-10-CM | POA: Insufficient documentation

## 2015-02-06 NOTE — Telephone Encounter (Signed)
Pt calls to report that she has been on Anoro for the last 2 weeks - doesn't feel that it is helping her as much as Stiolto. She has been taking it, but notes that she is having persistent eye-watering since starting. Unclear whether this is a side effect or coincidence. She is also concerned about the cost, $85. I told her that we may able to get financial assistance, but that first I want to see if the Anoro is causing the eye sx. Asked her to stop the Anoro, call us Friday to let us know how her eyes are doing off the medication. May need to treat her eyes in some other way, ie different allergy regimen.

## 2015-02-08 ENCOUNTER — Telehealth: Payer: Self-pay | Admitting: Emergency Medicine

## 2015-02-08 NOTE — Telephone Encounter (Signed)
Per 02/01/15 phone note: Collene Gobble, MD at 02/06/2015 11:01 AM     Status: Signed       Expand All Collapse All   Pt calls to report that she has been on Anoro for the last 2 weeks - doesn't feel that it is helping her as much as Stiolto. She has been taking it, but notes that she is having persistent eye-watering since starting. Unclear whether this is a side effect or coincidence. She is also concerned about the cost, $85. I told her that we may able to get financial assistance, but that first I want to see if the Anoro is causing the eye sx. Asked her to stop the Anoro, call us Friday to let us know how her eyes are doing off the medication. May need to treat her eyes in some other way, ie different allergy regimen.      --   Spoke with pt. She reports her eyes have stopped watering but has a "film" over her eyes now. She reports RB wanted her to call with an update. Please advise thanks

## 2015-02-08 NOTE — Telephone Encounter (Signed)
Called pt and is aware of recs. TP appt scheduled for 7/19 at 2pm. Nothing further needed

## 2015-02-08 NOTE — Telephone Encounter (Signed)
I would like for her to stay off the Anoro and Stiolto. Arrange for an OV with either myself or TP

## 2015-02-12 ENCOUNTER — Encounter: Payer: Self-pay | Admitting: Adult Health

## 2015-02-12 ENCOUNTER — Ambulatory Visit (INDEPENDENT_AMBULATORY_CARE_PROVIDER_SITE_OTHER): Payer: PPO | Admitting: Adult Health

## 2015-02-12 VITALS — BP 102/70 | HR 61 | Temp 98.1°F | Ht 66.0 in | Wt 132.0 lb

## 2015-02-12 DIAGNOSIS — R911 Solitary pulmonary nodule: Secondary | ICD-10-CM

## 2015-02-12 DIAGNOSIS — J449 Chronic obstructive pulmonary disease, unspecified: Secondary | ICD-10-CM | POA: Diagnosis not present

## 2015-02-12 MED ORDER — ALBUTEROL SULFATE HFA 108 (90 BASE) MCG/ACT IN AERS: 2.0000 | INHALATION_SPRAY | RESPIRATORY_TRACT | Status: DC | PRN

## 2015-02-12 NOTE — Progress Notes (Signed)
Subjective:    Patient ID: [name], female    DOB: 08-08-1940, 74 y.o.   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 She has been having dyspnea for > 1 year, some cough that is productive of white mucous. She has hoarse voice frequently, not necessarily related to cough. Not clear that it relates in time to symbicort.   Part of her eval has included CXR that showed a 1.4x0.6cm  L apical nodule, confirmed by CT scan 09/28/12. PET on 10/07/12 showed the nodule was cold.   She is on Symbicort qam only for over a year, feels that it helps her.   PFT 09/02/12 >> moderately severe AFL without BD response, normal volumes, decreased diffusion.   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 PET negative 1.4x0.6cm  L apical nodule 10/07/12. We stopped symbicort, started spiriva last time.  She doesn't think the SOB has changed any since starting. She is still having some mucous but better. Still has cough, esp in the am. She doesn't have a SABA. She is due for CT scan chest w Dr Julien Nordmann in 9/'14. Not on nasal steroid or anti-histamine.   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 PET negative 1.4x0.6cm  L apical nodule 10/07/12. Stable by CT scan 04/10/13.  Had been doing well until this week when she got sore throat, dry cough. Using spiriva, albuterol prn > about once a day.  She has flonase at home but hasn't used it yet.   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 We are following PET negative L apical nodule, repeat Ct scan due in September. She stopped her spiriva due to some associated SOB, ? Due to UA  effects. She tells me that she had a possible rxn to fosamax, caused dyspnea and ? Throat swelling. She feels that her breathing is better. She uses 1 puff her rescue SABA about once a day. Doesn't feel that she misses the spiriva.   ROV 12/04/14 -- follow-up visit for COPD and an abnormal CT scan with a left apical nodule. She also has a history of adenocarcinoma status post a right lower lobe superior segmentectomy in 2008.  Her most recent CT scan of the chest was performed in October 2015 and was stable without any new findings. The left apical nodule was unchanged.  She has been having more breathing trouble since the weather has gotten warmer.  She has heard some wheeze, some allergy sx.  She states that she had associated CP with exertion. She could not tolerate fluticasone nasal spray - said it made her breathing worse. She is confused / confusing regarding which inhaled meds she has been on, when, and how she has responded to them. I believe she has tried albuterol for these sx without good response.   ROV 01/11/15 -- follow-up visit for history of COPD, adenocarcinoma of the lung status post right lower lobe superior segmentectomy, chest CT with a stable left apical nodule. She is due for another CT chest in October 2016. Last month I asked her to start Stiolto to see if she would benefit. She started  the med, developed diarrhea that has been happening since the [employer]. She decreased to qod, still has the diarrhea on the days when she takes it. She started flonase but it was ineffective. g  02/12/2015 Follow up : [facility] NP / COPD-GOLD II  , Adenocarcinoma of lung s/p RLL superior segmentectomy, left apical nodules .  [name] .  Started on ANORO last month but has been unable to tolerate due to watery eyes, blurred vision  Has ov with eye doctor this week.  Previously on Stiolto but stopped due to diarrhea.  Does not want to be on any inhalers . Feels they make her worse.  Feels  her breathing is at baseline. Gets winded with activity. No dyspnea at rest.  No chest pain , orthopnea, edema or fever.   Last CT chest 04/2014 no acute finding, stable nodule . Upcoming CT in 04/2105     Review of Systems  Constitutional: Negative for fever, chills and unexpected weight change.  HENT: Negative for dental problem, ear pain, nosebleeds, sinus pressure, sneezing, sore throat and trouble swallowing.   Eyes: Negative for redness and itching.  Respiratory: . Negative for chest tightness.  Cardiovascular: Negative for palpitations and leg swelling.  Gastrointestinal: Negative for nausea and vomiting.  Genitourinary: Negative for dysuria.  Musculoskeletal: Negative for joint swelling.  Skin: Negative for rash.  Neurological: Negative for headaches.  Hematological: Does not bruise/bleed easily.  Psychiatric/Behavioral: Negative for dysphoric mood. The patient is not nervous/anxious.       Objective:   Physical Exam  Gen: Pleasant, elderly in no distress,  normal affect  ENT: No lesions,  mouth clear,  oropharynx clear, no postnasal drip, hoarse voice  Neck: No JVD, no TMG, no carotid bruits  Lungs: No use of accessory muscles, very distant, clear without rales or rhonchi  Cardiovascular: RRR, heart sounds normal, no murmur or gallops, no peripheral edema  Musculoskeletal: No deformities, no cyanosis or clubbing  Neuro: alert, non focal  Skin: Warm, no lesions or rashes  04/2014 --  COMPARISON: 04/11/2013  FINDINGS: Mediastinum: The heart size is normal. There is no pericardial effusion. Calcified atherosclerotic plaque involves the thoracic aorta as well as the LAD, left circumflex and RCA coronary arteries. 8 mm right paratracheal lymph node is unchanged. No enlarged mediastinal or hilar lymph nodes.  Lungs/Pleura: No pleural effusion. Moderate changes of centrilobular emphysema. Postsurgical changes identified within the right lower lobe centrally.  Left apical nodule density measures 1.5 cm, image 5/ series 5. Unchanged from previous exam. Stable nodular opacity within the superior segment of left lower lobe measuring 1.1 cm, image number 27/series 5. Right upper lobe nodule measured 5 mm, image 19/series 5. No new or enlarging pulmonary nodules or masses.  Upper abdomen: The visualized portions of the liver and gallbladder are unremarkable. The visualized portions of the spleen and adrenal glands are also normal.  Musculoskeletal: There is a scoliosis deformity involving the thoracic spine. No aggressive lytic or sclerotic bone lesions identified.  IMPRESSION: 1. No acute findings and no significant change when compared with 04/11/2013. 2. Previously referenced left apical nodular density is unchanged from previous exam.     Assessment & Plan:

## 2015-02-12 NOTE — Patient Instructions (Signed)
Remain off [place] [address] .  May have Albuterol Inhaler 2 puffs every 4hrs as needed for wheezing -this is your rescue inhaler.  Follow up Dr. Lamonte Sakai  In 3 months and As needed

## 2015-02-12 NOTE — Assessment & Plan Note (Signed)
Follow up CT in Oct as planned

## 2015-02-12 NOTE — Assessment & Plan Note (Signed)
Intolerant to [employer] , no sign flare off inhalers.  Remains off maintence for now  Add proair As needed   follow up in 3 months

## 2015-02-19 ENCOUNTER — Other Ambulatory Visit: Payer: Self-pay

## 2015-02-19 DIAGNOSIS — Z1231 Encounter for screening mammogram for malignant neoplasm of breast: Secondary | ICD-10-CM

## 2015-03-19 ENCOUNTER — Ambulatory Visit
Admission: RE | Admit: 2015-03-19 | Discharge: 2015-03-19 | Disposition: A | Discharge status: Home / Self Care | Payer: PPO | Source: Ambulatory Visit

## 2015-03-19 DIAGNOSIS — Z1231 Encounter for screening mammogram for malignant neoplasm of breast: Secondary | ICD-10-CM

## 2015-03-20 ENCOUNTER — Encounter: Payer: Self-pay | Admitting: *Deleted

## 2015-03-20 ENCOUNTER — Other Ambulatory Visit: Payer: Self-pay | Admitting: Cardiovascular Disease

## 2015-03-20 LAB — HM MAMMOGRAPHY: HM Mammogram: NORMAL

## 2015-03-27 ENCOUNTER — Other Ambulatory Visit: Payer: Self-pay | Admitting: Family Medicine

## 2015-03-27 ENCOUNTER — Telehealth: Payer: Self-pay | Admitting: Internal Medicine

## 2015-03-27 DIAGNOSIS — N183 Chronic kidney disease, stage 3 unspecified: Secondary | ICD-10-CM

## 2015-03-27 DIAGNOSIS — E785 Hyperlipidemia, unspecified: Secondary | ICD-10-CM

## 2015-03-27 DIAGNOSIS — R7303 Prediabetes: Secondary | ICD-10-CM

## 2015-03-27 DIAGNOSIS — I10 Essential (primary) hypertension: Secondary | ICD-10-CM

## 2015-03-27 DIAGNOSIS — E559 Vitamin D deficiency, unspecified: Secondary | ICD-10-CM

## 2015-03-27 NOTE — Telephone Encounter (Signed)
pt called to r/s appt time...done...pt ok and aware of new d.t

## 2015-03-28 ENCOUNTER — Other Ambulatory Visit (INDEPENDENT_AMBULATORY_CARE_PROVIDER_SITE_OTHER): Payer: PPO

## 2015-03-28 DIAGNOSIS — E559 Vitamin D deficiency, unspecified: Secondary | ICD-10-CM

## 2015-03-28 DIAGNOSIS — E785 Hyperlipidemia, unspecified: Secondary | ICD-10-CM

## 2015-03-28 DIAGNOSIS — R7303 Prediabetes: Secondary | ICD-10-CM

## 2015-03-28 DIAGNOSIS — R7309 Other abnormal glucose: Secondary | ICD-10-CM

## 2015-03-28 DIAGNOSIS — I10 Essential (primary) hypertension: Secondary | ICD-10-CM | POA: Diagnosis not present

## 2015-03-28 DIAGNOSIS — N183 Chronic kidney disease, stage 3 unspecified: Secondary | ICD-10-CM

## 2015-03-28 LAB — CBC WITH DIFFERENTIAL/PLATELET
BASOS ABS: 0.1 10*3/uL (ref 0.0–0.1)
Basophils Relative: 0.8 % (ref 0.0–3.0)
EOS ABS: 0.8 10*3/uL — AB (ref 0.0–0.7)
Eosinophils Relative: 12 % — ABNORMAL HIGH (ref 0.0–5.0)
HCT: 40.2 % (ref 36.0–46.0)
HEMOGLOBIN: 13.3 g/dL (ref 12.0–15.0)
LYMPHS ABS: 2.1 10*3/uL (ref 0.7–4.0)
Lymphocytes Relative: 33.4 % (ref 12.0–46.0)
MCHC: 33.1 g/dL (ref 30.0–36.0)
MCV: 95.3 fl (ref 78.0–100.0)
Monocytes Absolute: 0.6 10*3/uL (ref 0.1–1.0)
Monocytes Relative: 9.3 % (ref 3.0–12.0)
NEUTROS PCT: 44.5 % (ref 43.0–77.0)
Neutro Abs: 2.8 10*3/uL (ref 1.4–7.7)
Platelets: 215 10*3/uL (ref 150.0–400.0)
RBC: 4.22 Mil/uL (ref 3.87–5.11)
RDW: 13.7 % (ref 11.5–15.5)
WBC: 6.3 10*3/uL (ref 4.0–10.5)

## 2015-03-28 LAB — BASIC METABOLIC PANEL
BUN: 18 mg/dL (ref 6–23)
CO2: 31 mEq/L (ref 19–32)
CREATININE: 0.97 mg/dL (ref 0.40–1.20)
Calcium: 10.1 mg/dL (ref 8.4–10.5)
Chloride: 102 mEq/L (ref 96–112)
GFR: 59.62 mL/min — AB (ref >60.00)
Glucose, Bld: 102 mg/dL — ABNORMAL HIGH (ref 70–99)
Potassium: 4.4 mEq/L (ref 3.5–5.1)
Sodium: 139 mEq/L (ref 135–145)

## 2015-03-28 LAB — LIPID PANEL
CHOL/HDL RATIO: 2
Cholesterol: 140 mg/dL (ref 0–200)
HDL: 57.5 mg/dL (ref >39.00)
LDL Cholesterol: 71 mg/dL (ref 0–99)
NonHDL: 82.91
Triglycerides: 62 mg/dL (ref 0.0–149.0)
VLDL: 12.4 mg/dL (ref 0.0–40.0)

## 2015-03-28 LAB — HEMOGLOBIN A1C: Hgb A1c MFr Bld: 6.1 % (ref 4.6–6.5)

## 2015-03-28 LAB — VITAMIN D 25 HYDROXY (VIT D DEFICIENCY, FRACTURES): VITD: 80.29 ng/mL (ref 30.00–100.00)

## 2015-04-03 ENCOUNTER — Other Ambulatory Visit: Payer: Self-pay | Admitting: Cardiovascular Disease

## 2015-04-08 ENCOUNTER — Ambulatory Visit (INDEPENDENT_AMBULATORY_CARE_PROVIDER_SITE_OTHER): Payer: PPO | Admitting: Family Medicine

## 2015-04-08 ENCOUNTER — Encounter: Payer: Self-pay | Admitting: Family Medicine

## 2015-04-08 VITALS — BP 114/60 | HR 64 | Temp 97.3°F | Ht 66.0 in | Wt 129.0 lb

## 2015-04-08 DIAGNOSIS — Z23 Encounter for immunization: Secondary | ICD-10-CM | POA: Diagnosis not present

## 2015-04-08 DIAGNOSIS — M1990 Unspecified osteoarthritis, unspecified site: Secondary | ICD-10-CM

## 2015-04-08 DIAGNOSIS — Z1211 Encounter for screening for malignant neoplasm of colon: Secondary | ICD-10-CM

## 2015-04-08 DIAGNOSIS — I25119 Atherosclerotic heart disease of native coronary artery with unspecified angina pectoris: Secondary | ICD-10-CM

## 2015-04-08 DIAGNOSIS — I255 Ischemic cardiomyopathy: Secondary | ICD-10-CM

## 2015-04-08 DIAGNOSIS — R7309 Other abnormal glucose: Secondary | ICD-10-CM

## 2015-04-08 DIAGNOSIS — M810 Age-related osteoporosis without current pathological fracture: Secondary | ICD-10-CM

## 2015-04-08 DIAGNOSIS — Z0000 Encounter for general adult medical examination without abnormal findings: Secondary | ICD-10-CM

## 2015-04-08 DIAGNOSIS — C3431 Malignant neoplasm of lower lobe, right bronchus or lung: Secondary | ICD-10-CM

## 2015-04-08 DIAGNOSIS — J449 Chronic obstructive pulmonary disease, unspecified: Secondary | ICD-10-CM

## 2015-04-08 DIAGNOSIS — Z7189 Other specified counseling: Secondary | ICD-10-CM | POA: Diagnosis not present

## 2015-04-08 DIAGNOSIS — N183 Chronic kidney disease, stage 3 unspecified: Secondary | ICD-10-CM

## 2015-04-08 DIAGNOSIS — I10 Essential (primary) hypertension: Secondary | ICD-10-CM

## 2015-04-08 DIAGNOSIS — E785 Hyperlipidemia, unspecified: Secondary | ICD-10-CM

## 2015-04-08 DIAGNOSIS — I5042 Chronic combined systolic (congestive) and diastolic (congestive) heart failure: Secondary | ICD-10-CM

## 2015-04-08 DIAGNOSIS — R7303 Prediabetes: Secondary | ICD-10-CM

## 2015-04-08 NOTE — Assessment & Plan Note (Signed)
Severe. Limited options. Pt declines injection. intolerant to bisphosphonate. Requests rpt DEXA to monitor progression. Discussed importance of calcium/vit D and weight bearing exercise.

## 2015-04-08 NOTE — Assessment & Plan Note (Signed)
Asxs. Continue to monitor.

## 2015-04-08 NOTE — Assessment & Plan Note (Signed)

## 2015-04-08 NOTE — Patient Instructions (Addendum)
Flu shot today. Pass by lab to pick up stool kit.  We will call you to schedule bone density scan. Return in 1 month for nurse visit for prevnar 13 (second and final pneumonia shot). On weekends get out to local park for walking - weight bearing exercises will help keep your bones strong. Continue calcium and vitamin D as up to now.  Work on increased water by 1 8oz glasses a day. Increase fruits/vegetables daily. Return as needed or in 6 months for follow up visit Nice to see you today, call us with questions.  Health Maintenance Adopting a healthy lifestyle and getting preventive care can go a long way to promote health and wellness. Talk with your health care provider about what schedule of regular examinations is right for you. This is a good chance for you to check in with your provider about disease prevention and staying healthy. In between checkups, there are plenty of things you can do on your own. Experts have done a lot of research about which lifestyle changes and preventive measures are most likely to keep you healthy. Ask your health care provider for more information. WEIGHT AND DIET  Eat a healthy diet  Be sure to include plenty of vegetables, fruits, low-fat dairy products, and lean protein.  Do not eat a lot of foods high in solid fats, added sugars, or salt.  Get regular exercise. This is one of the most important things you can do for your health.  Most adults should exercise for at least 150 minutes each week. The exercise should increase your heart rate and make you sweat (moderate-intensity exercise).  Most adults should also do strengthening exercises at least twice a week. This is in addition to the moderate-intensity exercise.  Maintain a healthy weight  Body mass index (BMI) is a measurement that can be used to identify possible weight problems. It estimates body fat based on height and weight. Your health care provider can help determine your BMI and help you  achieve or maintain a healthy weight.  For females 12 years of age and older:   A BMI below 18.5 is considered underweight.  A BMI of 18.5 to 24.9 is normal.  A BMI of 25 to 29.9 is considered overweight.  A BMI of 30 and above is considered obese.  Watch levels of cholesterol and blood lipids  You should start having your blood tested for lipids and cholesterol at 74 years of age, then have this test every 5 years.  You may need to have your cholesterol levels checked more often if:  Your lipid or cholesterol levels are high.  You are older than 74 years of age.  You are at high risk for heart disease.  CANCER SCREENING   Lung Cancer  Lung cancer screening is recommended for adults 69-86 years old who are at high risk for lung cancer because of a history of smoking.  A yearly low-dose CT scan of the lungs is recommended for people who:  Currently smoke.  Have quit within the past 15 years.  Have at least a 30-pack-year history of smoking. A pack year is smoking an average of one pack of cigarettes a day for 1 year.  Yearly screening should continue until it has been 15 years since you quit.  Yearly screening should stop if you develop a health problem that would prevent you from having lung cancer treatment.  Breast Cancer  Practice breast self-awareness. This means understanding how your breasts normally appear  and feel.  It also means doing regular breast self-exams. Let your health care provider know about any changes, no matter how small.  If you are in your 20s or 30s, you should have a clinical breast exam (CBE) by a health care provider every 1-3 years as part of a regular health exam.  If you are 50 or older, have a CBE every year. Also consider having a breast X-ray (mammogram) every year.  If you have a family history of breast cancer, talk to your health care provider about genetic screening.  If you are at high risk for breast cancer, talk to your  health care provider about having an MRI and a mammogram every year.  Breast cancer gene (BRCA) assessment is recommended for women who have family members with BRCA-related cancers. BRCA-related cancers include:  Breast.  Ovarian.  Tubal.  Peritoneal cancers.  Results of the assessment will determine the need for genetic counseling and BRCA1 and BRCA2 testing. Cervical Cancer Routine pelvic examinations to screen for cervical cancer are no longer recommended for nonpregnant women who are considered low risk for cancer of the pelvic organs (ovaries, uterus, and vagina) and who do not have symptoms. A pelvic examination may be necessary if you have symptoms including those associated with pelvic infections. Ask your health care provider if a screening pelvic exam is right for you.   The Pap test is the screening test for cervical cancer for women who are considered at risk.  If you had a hysterectomy for a problem that was not cancer or a condition that could lead to cancer, then you no longer need Pap tests.  If you are older than 65 years, and you have had normal Pap tests for the past 10 years, you no longer need to have Pap tests.  If you have had past treatment for cervical cancer or a condition that could lead to cancer, you need Pap tests and screening for cancer for at least 20 years after your treatment.  If you no longer get a Pap test, assess your risk factors if they change (such as having a new sexual partner). This can affect whether you should start being screened again.  Some women have medical problems that increase their chance of getting cervical cancer. If this is the case for you, your health care provider may recommend more frequent screening and Pap tests.  The human papillomavirus (HPV) test is another test that may be used for cervical cancer screening. The HPV test looks for the virus that can cause cell changes in the cervix. The cells collected during the Pap  test can be tested for HPV.  The HPV test can be used to screen women 29 years of age and older. Getting tested for HPV can extend the interval between normal Pap tests from three to five years.  An HPV test also should be used to screen women of any age who have unclear Pap test results.  After 74 years of age, women should have HPV testing as often as Pap tests.  Colorectal Cancer  This type of cancer can be detected and often prevented.  Routine colorectal cancer screening usually begins at 74 years of age and continues through 74 years of age.  Your health care provider may recommend screening at an earlier age if you have risk factors for colon cancer.  Your health care provider may also recommend using home test kits to check for hidden blood in the stool.  A small  camera at the end of a tube can be used to examine your colon directly (sigmoidoscopy or colonoscopy). This is done to check for the earliest forms of colorectal cancer.  Routine screening usually begins at age 24.  Direct examination of the colon should be repeated every 5-10 years through 74 years of age. However, you may need to be screened more often if early forms of precancerous polyps or small growths are found. Skin Cancer  Check your skin from head to toe regularly.  Tell your health care provider about any new moles or changes in moles, especially if there is a change in a mole's shape or color.  Also tell your health care provider if you have a mole that is larger than the size of a pencil eraser.  Always use sunscreen. Apply sunscreen liberally and repeatedly throughout the day.  Protect yourself by wearing long sleeves, pants, a wide-brimmed hat, and sunglasses whenever you are outside. HEART DISEASE, DIABETES, AND HIGH BLOOD PRESSURE   Have your blood pressure checked at least every 1-2 years. High blood pressure causes heart disease and increases the risk of stroke.  If you are between 50 years  and 58 years old, ask your health care provider if you should take aspirin to prevent strokes.  Have regular diabetes screenings. This involves taking a blood sample to check your fasting blood sugar level.  If you are at a normal weight and have a low risk for diabetes, have this test once every three years after 74 years of age.  If you are overweight and have a high risk for diabetes, consider being tested at a younger age or more often. PREVENTING INFECTION  Hepatitis B  If you have a higher risk for hepatitis B, you should be screened for this virus. You are considered at high risk for hepatitis B if:  You were born in a country where hepatitis B is common. Ask your health care provider which countries are considered high risk.  Your parents were born in a high-risk country, and you have not been immunized against hepatitis B (hepatitis B vaccine).  You have HIV or AIDS.  You use needles to inject street drugs.  You live with someone who has hepatitis B.  You have had sex with someone who has hepatitis B.  You get hemodialysis treatment.  You take certain medicines for conditions, including cancer, organ transplantation, and autoimmune conditions. Hepatitis C  Blood testing is recommended for:  Everyone born from [birth year] through 1965.  Anyone with known risk factors for hepatitis C. Sexually transmitted infections (STIs)  You should be screened for sexually transmitted infections (STIs) including gonorrhea and chlamydia if:  You are sexually active and are younger than 74 years of age.  You are older than 74 years of age and your health care provider tells you that you are at risk for this type of infection.  Your sexual activity has changed since you were last screened and you are at an increased risk for chlamydia or gonorrhea. Ask your health care provider if you are at risk.  If you do not have HIV, but are at risk, it may be recommended that you take a prescription  medicine daily to prevent HIV infection. This is called pre-exposure prophylaxis (PrEP). You are considered at risk if:  You are sexually active and do not regularly use condoms or know the HIV status of your partner(s).  You take drugs by injection.  You are sexually active with a  partner who has HIV. Talk with your health care provider about whether you are at high risk of being infected with HIV. If you choose to begin PrEP, you should first be tested for HIV. You should then be tested every 3 months for as long as you are taking PrEP.  PREGNANCY   If you are premenopausal and you may become pregnant, ask your health care provider about preconception counseling.  If you may become pregnant, take 400 to 800 micrograms (mcg) of folic acid every day.  If you want to prevent pregnancy, talk to your health care provider about birth control (contraception). OSTEOPOROSIS AND MENOPAUSE   Osteoporosis is a disease in which the bones lose minerals and strength with aging. This can result in serious bone fractures. Your risk for osteoporosis can be identified using a bone density scan.  If you are 64 years of age or older, or if you are at risk for osteoporosis and fractures, ask your health care provider if you should be screened.  Ask your health care provider whether you should take a calcium or vitamin D supplement to lower your risk for osteoporosis.  Menopause may have certain physical symptoms and risks.  Hormone replacement therapy may reduce some of these symptoms and risks. Talk to your health care provider about whether hormone replacement therapy is right for you.  HOME CARE INSTRUCTIONS   Schedule regular health, dental, and eye exams.  Stay current with your immunizations.   Do not use any tobacco products including cigarettes, chewing tobacco, or electronic cigarettes.  If you are pregnant, do not drink alcohol.  If you are breastfeeding, limit how much and how often you  drink alcohol.  Limit alcohol intake to no more than 1 drink per day for nonpregnant women. One drink equals 12 ounces of beer, 5 ounces of wine, or 1 ounces of hard liquor.  Do not use street drugs.  Do not share needles.  Ask your health care provider for help if you need support or information about quitting drugs.  Tell your health care provider if you often feel depressed.  Tell your health care provider if you have ever been abused or do not feel safe at home. Document Released: 01/26/2011 Document Revised: 11/27/2013 Document Reviewed: 06/14/2013 [facility] Patient Information 2015 [place], [place]. This information is not intended to replace advice given to you by your health care provider. Make sure you discuss any questions you have with your health care provider.

## 2015-04-08 NOTE — Assessment & Plan Note (Signed)
Stable on prn albuterol.

## 2015-04-08 NOTE — Assessment & Plan Note (Signed)
Asxs. Followed yearly by pulm.

## 2015-04-08 NOTE — Assessment & Plan Note (Signed)
Advanced directive discussion - has at home. Son is HCPOA. Advised to bring me copy.

## 2015-04-08 NOTE — Assessment & Plan Note (Signed)
Chronic, stable. Great control on current regimen.

## 2015-04-08 NOTE — Assessment & Plan Note (Signed)
Seems euvolemic. Continue current regimen. Followed by cards.

## 2015-04-08 NOTE — Assessment & Plan Note (Signed)
Continue f/u with cards

## 2015-04-08 NOTE — Assessment & Plan Note (Signed)
Chronic, stable. Suggested increase water intake by 1 8oz glass of water daily.

## 2015-04-08 NOTE — Assessment & Plan Note (Signed)
Chronic, stable. Continue current regimen. 

## 2015-04-08 NOTE — Progress Notes (Signed)
Pre visit review using our clinic review tool, if applicable. No additional management support is needed unless otherwise documented below in the visit note. 

## 2015-04-08 NOTE — Progress Notes (Signed)
BP 114/60 mmHg  Pulse 64  Temp(Src) 97.3 F (36.3 C) (Oral)  Ht '5\' 6"'$  (1.676 m)  Wt 129 lb (58.514 kg)  BMI 20.83 kg/m2   CC: medicare wellness visit  Subjective:    Patient ID: [name], female    DOB: 1941/05/30, 74 y.o.   MRN: [id number]  HPI: [name] is a 74 y.o. 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 Followed by oncology, pulmonology and cardiology. Heart meds are through cardiology.   Quit smoking 2006.   Sits with patient with dementia M-F.   Hearing screen - passed Vision screen - with eye doctor 02/2015 Fall risk screen - passed Depression screen - passed  Preventative: Colon cancer screening - does not want colonoscopy - yearly stool kits normal, requests rpt today. H/o lung cancer - s/p resection 2008, followed by Dr Earlie Server.  Breast cancer screening - WNL birads 1 02/2015 Well woman exam - s/p partial hysterectomy for irregular periods, ovaries remain. Denies any concerns or skin changes or pelvic fullness. DEXA scan - osteoporosis T -3.4 (femur), -3.6 (forearm). Declines prolia. Actonel caused bone pain. Doesn't want shots. Takes calcium/vit d x2 and vit D 2000 x1.  Flu shot - today Tetanus shot - unsure Pneumovax 2011, prevnar in 1 mo Shingles shot - 2013  Advanced directive discussion - has at home. Son is HCPOA. Advised to bring me copy. Seat belt use discussed Sunscreen use and skin cancer discussed   The patient is a widow and has one son and one dog.  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 She does not drink alcohol.  Ed: HS Activity: no regular exercise Diet: some water, fruits/vegetables daily  Relevant past medical, surgical, family and social history reviewed and updated as indicated. Interim medical history since our last visit reviewed. Allergies and  medications reviewed and updated. Current Outpatient Prescriptions on File Prior to Visit  Medication Sig  . albuterol (PROAIR HFA) 108 (90 BASE) MCG/ACT inhaler Inhale 2 puffs into the lungs every 4 (four) hours as needed for wheezing or shortness of breath.  Marland Kitchen aspirin 325 MG tablet Take 1 tablet (325 mg total) by mouth daily.  . Calcium Carbonate-Vitamin D (CALCIUM-VITAMIN D) 500-200 MG-UNIT per tablet Take 2 tablets by mouth daily.  . carvedilol (COREG) 3.125 MG tablet TAKE 1 TABLET BY MOUTH TWICE A DAY WITH FOOD  . furosemide (LASIX) 40 MG tablet TAKE 1 TABLET BY MOUTH DAILY  . isosorbide mononitrate (IMDUR) 30 MG 24 hr tablet Take 1 tablet (30 mg total) by mouth daily.  Marland Kitchen losartan (COZAAR) 50 MG tablet TAKE 1 TABLET BY MOUTH DAILY  . LOTEMAX 0.5 % ophthalmic suspension Place 1 drop into both eyes daily.   . nitroGLYCERIN (NITROSTAT) 0.4 MG SL tablet Place 1 tablet (0.4 mg total) under the tongue every 5 (five) minutes as needed for chest pain.  Marland Kitchen omeprazole (PRILOSEC) 20 MG capsule TAKE ONE CAPSULE BY MOUTH DAILY  . simvastatin (ZOCOR) 40 MG tablet TAKE ONE TABLET BY MOUTH EVERY NIGHT AT BEDTIME  . spironolactone (ALDACTONE) 25 MG tablet TAKE 1/2  TABLET BY MOUTH ONCE DAILY.   No current facility-administered medications on file prior to visit.    Review of Systems Per HPI unless specifically indicated above     Objective:  BP 114/60 mmHg  Pulse 64  Temp(Src) 97.3 F (36.3 C) (Oral)  Ht '5\' 6"'$  (1.676 m)  Wt 129 lb (58.514 kg)  BMI 20.83 kg/m2  Wt Readings from Last 3 Encounters:  04/08/15 129 lb (58.514 kg)  02/12/15 132 lb (59.875 kg)  01/11/15 129 lb (58.514 kg)    Physical Exam  Constitutional: She is oriented to person, place, and time. She appears well-developed and well-nourished. No distress.  HENT:  Head: Normocephalic and atraumatic.  Right Ear: Hearing, tympanic membrane, external ear and ear canal normal.  Left Ear: Hearing, tympanic membrane, external ear  and ear canal normal.  Nose: Nose normal.  Mouth/Throat: Uvula is midline, oropharynx is clear and moist and mucous membranes are normal. No oropharyngeal exudate, posterior oropharyngeal edema or posterior oropharyngeal erythema.  Eyes: Conjunctivae and EOM are normal. Pupils are equal, round, and reactive to light. No scleral icterus.  Neck: Normal range of motion. Neck supple. Carotid bruit is not present. No thyromegaly present.  Cardiovascular: Normal rate, regular rhythm, normal heart sounds and intact distal pulses.   No murmur heard. Pulses:      Radial pulses are 2+ on the right side, and 2+ on the left side.  Pulmonary/Chest: Effort normal and breath sounds normal. No respiratory distress. She has no wheezes. She has no rales.  Abdominal: Soft. Bowel sounds are normal. She exhibits no distension and no mass. There is no tenderness. There is no rebound and no guarding.  Musculoskeletal: Normal range of motion. She exhibits no edema.  Lymphadenopathy:    She has no cervical adenopathy.  Neurological: She is alert and oriented to person, place, and time.  CN grossly intact, station and gait intact Recall 3/3  Calculation 4/5 serial 3s  Skin: Skin is warm and dry. No rash noted.  Psychiatric: She has a normal mood and affect. Her behavior is normal. Judgment and thought content normal.  Nursing note and vitals reviewed.  Results for orders placed or performed in visit on 03/28/15  Lipid panel  Result Value Ref Range   Cholesterol 140 0 - 200 mg/dL   Triglycerides 62.0 0.0 - 149.0 mg/dL   HDL 57.50 >39.00 mg/dL   VLDL 12.4 0.0 - 40.0 mg/dL   LDL Cholesterol 71 0 - 99 mg/dL   Total CHOL/HDL Ratio 2    NonHDL 82.91   Hemoglobin A1c  Result Value Ref Range   Hgb A1c MFr Bld 6.1 4.6 - 6.5 %  Basic metabolic panel  Result Value Ref Range   Sodium 139 135 - 145 mEq/L   Potassium 4.4 3.5 - 5.1 mEq/L   Chloride 102 96 - 112 mEq/L   CO2 31 19 - 32 mEq/L   Glucose, Bld 102 (H) 70  - 99 mg/dL   BUN 18 6 - 23 mg/dL   Creatinine, Ser 0.97 0.40 - 1.20 mg/dL   Calcium 10.1 8.4 - 10.5 mg/dL   GFR 59.62 (L) >60.00 mL/min  Vit D  25 hydroxy (rtn osteoporosis monitoring)  Result Value Ref Range   VITD 80.29 30.00 - 100.00 ng/mL  CBC with Differential/Platelet  Result Value Ref Range   WBC 6.3 4.0 - 10.5 K/uL   RBC 4.22 3.87 - 5.11 Mil/uL   Hemoglobin 13.3 12.0 - 15.0 g/dL   HCT 40.2 36.0 - 46.0 %   MCV 95.3 78.0 - 100.0 fl   MCHC 33.1 30.0 - 36.0 g/dL   RDW 13.7 11.5 - 15.5 %   Platelets 215.0 150.0 -  400.0 K/uL   Neutrophils Relative % 44.5 43.0 - 77.0 %   Lymphocytes Relative 33.4 12.0 - 46.0 %   Monocytes Relative 9.3 3.0 - 12.0 %   Eosinophils Relative 12.0 (H) 0.0 - 5.0 %   Basophils Relative 0.8 0.0 - 3.0 %   Neutro Abs 2.8 1.4 - 7.7 K/uL   Lymphs Abs 2.1 0.7 - 4.0 K/uL   Monocytes Absolute 0.6 0.1 - 1.0 K/uL   Eosinophils Absolute 0.8 (H) 0.0 - 0.7 K/uL   Basophils Absolute 0.1 0.0 - 0.1 K/uL      Assessment & Plan:   Problem List Items Addressed This Visit    HLD (hyperlipidemia)    Chronic, stable. Great control on current regimen.      Essential hypertension    Chronic, stable. Continue current regimen.      CAD, NATIVE VESSEL    Asxs. Continue to monitor.      Chronic combined systolic and diastolic heart failure    Seems euvolemic. Continue current regimen. Followed by cards.       Osteoarthritis   Cancer of lower lobe of right lung    Asxs. Followed yearly by pulm.      COPD (chronic obstructive pulmonary disease)    Stable on prn albuterol.       Ischemic cardiomyopathy    Continue f/u with cards.      Osteoporosis    Severe. Limited options. Pt declines injection. intolerant to bisphosphonate. Requests rpt DEXA to monitor progression. Discussed importance of calcium/vit D and weight bearing exercise.      Relevant Medications   Cholecalciferol (VITAMIN D) 2000 UNITS CAPS   Other Relevant Orders   DG Bone Density    CKD (chronic kidney disease) stage 3, GFR 30-59 ml/min    Chronic, stable. Suggested increase water intake by 1 8oz glass of water daily.      Prediabetes    Reviewed #s and importance of avoiding added sugars in diet.      Medicare annual wellness visit, subsequent - Primary    I have personally reviewed the Medicare Annual Wellness questionnaire and have noted 1. The patient's medical and social history 2. Their use of alcohol, tobacco or illicit drugs 3. Their current medications and supplements 4. The patient's functional ability including ADL's, fall risks, home safety risks and hearing or visual impairment. Cognitive function has been assessed and addressed as indicated.  5. Diet and physical activity 6. Evidence for depression or mood disorders The patients weight, height, BMI have been recorded in the chart. I have made referrals, counseling and provided education to the patient based on review of the above and I have provided the pt with a written personalized care plan for preventive services. Provider list updated.. See scanned questionairre as needed for further documentation. Reviewed preventative protocols and updated unless pt declined.       Advanced care planning/counseling discussion    Advanced directive discussion - has at home. Son is HCPOA. Advised to bring me copy.       Other Visit Diagnoses    Need for prophylactic vaccination and inoculation against influenza        Relevant Orders    Flu Vaccine QUAD 36+ mos PF IM (Fluarix & Fluzone Quad PF) (Completed)    Special screening for malignant neoplasms, colon        Relevant Orders    Fecal occult blood, imunochemical        Follow up plan: Return in  about 6 months (around 10/06/2015), or as needed, for follow up visit.

## 2015-04-08 NOTE — Assessment & Plan Note (Signed)
Reviewed #s and importance of avoiding added sugars in diet.

## 2015-04-20 ENCOUNTER — Emergency Department (HOSPITAL_COMMUNITY): Payer: PPO

## 2015-04-20 ENCOUNTER — Emergency Department (HOSPITAL_COMMUNITY)
Admission: EM | Admit: 2015-04-20 | Discharge: 2015-04-20 | Disposition: A | Discharge status: Home / Self Care | Payer: PPO | Attending: Emergency Medicine | Admitting: Emergency Medicine

## 2015-04-20 ENCOUNTER — Encounter (HOSPITAL_COMMUNITY): Payer: Self-pay | Admitting: Emergency Medicine

## 2015-04-20 DIAGNOSIS — I5042 Chronic combined systolic (congestive) and diastolic (congestive) heart failure: Secondary | ICD-10-CM | POA: Insufficient documentation

## 2015-04-20 DIAGNOSIS — Z87891 Personal history of nicotine dependence: Secondary | ICD-10-CM | POA: Insufficient documentation

## 2015-04-20 DIAGNOSIS — N183 Chronic kidney disease, stage 3 (moderate): Secondary | ICD-10-CM | POA: Diagnosis not present

## 2015-04-20 DIAGNOSIS — R42 Dizziness and giddiness: Secondary | ICD-10-CM | POA: Insufficient documentation

## 2015-04-20 DIAGNOSIS — I129 Hypertensive chronic kidney disease with stage 1 through stage 4 chronic kidney disease, or unspecified chronic kidney disease: Secondary | ICD-10-CM | POA: Diagnosis not present

## 2015-04-20 DIAGNOSIS — Z7982 Long term (current) use of aspirin: Secondary | ICD-10-CM | POA: Insufficient documentation

## 2015-04-20 DIAGNOSIS — K219 Gastro-esophageal reflux disease without esophagitis: Secondary | ICD-10-CM | POA: Insufficient documentation

## 2015-04-20 DIAGNOSIS — E785 Hyperlipidemia, unspecified: Secondary | ICD-10-CM | POA: Insufficient documentation

## 2015-04-20 DIAGNOSIS — M810 Age-related osteoporosis without current pathological fracture: Secondary | ICD-10-CM | POA: Diagnosis not present

## 2015-04-20 DIAGNOSIS — Z79899 Other long term (current) drug therapy: Secondary | ICD-10-CM | POA: Insufficient documentation

## 2015-04-20 DIAGNOSIS — R110 Nausea: Secondary | ICD-10-CM | POA: Diagnosis not present

## 2015-04-20 DIAGNOSIS — I2510 Atherosclerotic heart disease of native coronary artery without angina pectoris: Secondary | ICD-10-CM | POA: Insufficient documentation

## 2015-04-20 DIAGNOSIS — M1990 Unspecified osteoarthritis, unspecified site: Secondary | ICD-10-CM | POA: Diagnosis not present

## 2015-04-20 DIAGNOSIS — J449 Chronic obstructive pulmonary disease, unspecified: Secondary | ICD-10-CM | POA: Diagnosis not present

## 2015-04-20 DIAGNOSIS — Z8619 Personal history of other infectious and parasitic diseases: Secondary | ICD-10-CM | POA: Diagnosis not present

## 2015-04-20 DIAGNOSIS — Z85118 Personal history of other malignant neoplasm of bronchus and lung: Secondary | ICD-10-CM | POA: Insufficient documentation

## 2015-04-20 DIAGNOSIS — Z8673 Personal history of transient ischemic attack (TIA), and cerebral infarction without residual deficits: Secondary | ICD-10-CM | POA: Insufficient documentation

## 2015-04-20 DIAGNOSIS — I252 Old myocardial infarction: Secondary | ICD-10-CM | POA: Insufficient documentation

## 2015-04-20 LAB — CBC
HCT: 39.2 % (ref 36.0–46.0)
Hemoglobin: 13.6 g/dL (ref 12.0–15.0)
MCH: 32.3 pg (ref 26.0–34.0)
MCHC: 34.7 g/dL (ref 30.0–36.0)
MCV: 93.1 fL (ref 78.0–100.0)
PLATELETS: 264 10*3/uL (ref 150–400)
RBC: 4.21 MIL/uL (ref 3.87–5.11)
RDW: 13.1 % (ref 11.5–15.5)
WBC: 15.6 10*3/uL — ABNORMAL HIGH (ref 4.0–10.5)

## 2015-04-20 LAB — BASIC METABOLIC PANEL
Anion gap: 7 (ref 5–15)
BUN: 20 mg/dL (ref 6–20)
CALCIUM: 9.3 mg/dL (ref 8.9–10.3)
CO2: 28 mmol/L (ref 22–32)
CREATININE: 1.06 mg/dL — AB (ref 0.44–1.00)
Chloride: 99 mmol/L — ABNORMAL LOW (ref 101–111)
GFR calc non Af Amer: 50 mL/min — ABNORMAL LOW (ref >60)
GFR, EST AFRICAN AMERICAN: 58 mL/min — AB (ref >60)
GLUCOSE: 109 mg/dL — AB (ref 65–99)
Potassium: 4 mmol/L (ref 3.5–5.1)
Sodium: 134 mmol/L — ABNORMAL LOW (ref 135–145)

## 2015-04-20 LAB — I-STAT TROPONIN, ED
TROPONIN I, POC: 0.01 ng/mL (ref 0.00–0.08)
Troponin i, poc: 0.03 ng/mL (ref 0.00–0.08)

## 2015-04-20 MED ORDER — SODIUM CHLORIDE 0.9 % IV SOLN: Freq: Once | INTRAVENOUS | Status: DC

## 2015-04-20 MED ORDER — MECLIZINE HCL 25 MG PO TABS
25.0000 mg | ORAL_TABLET | Freq: Once | ORAL | Status: AC
  Administered 2015-04-20: 25 mg via ORAL
  Filled 2015-04-20: qty 1

## 2015-04-20 MED ORDER — SODIUM CHLORIDE 0.9 % IV BOLUS (SEPSIS)
500.0000 mL | Freq: Once | INTRAVENOUS | Status: AC
  Administered 2015-04-20: 500 mL via INTRAVENOUS

## 2015-04-20 MED ORDER — ONDANSETRON 4 MG PO TBDP
ORAL_TABLET | ORAL | Status: AC
  Filled 2015-04-20: qty 1

## 2015-04-20 MED ORDER — MECLIZINE HCL 25 MG PO TABS: 25.0000 mg | ORAL_TABLET | Freq: Three times a day (TID) | ORAL | Status: DC | PRN

## 2015-04-20 MED ORDER — ONDANSETRON 4 MG PO TBDP
4.0000 mg | ORAL_TABLET | Freq: Once | ORAL | Status: AC
  Administered 2015-04-20: 4 mg via ORAL

## 2015-04-20 MED ORDER — ACETAMINOPHEN 325 MG PO TABS
325.0000 mg | ORAL_TABLET | Freq: Once | ORAL | Status: AC
  Administered 2015-04-20: 325 mg via ORAL
  Filled 2015-04-20: qty 1

## 2015-04-20 NOTE — Discharge Instructions (Signed)
Your MRI today does not show a stroke.  Your symptoms are likely secondary to something called vertigo.  Follow up outpatient as instructed and return with sudden change/worsening of your symptoms.  If you develop chest pain or shortness of breath or feeling like you heart is racing you should also return.    Vertigo Vertigo means you feel like you or your surroundings are moving when they are not. Vertigo can be dangerous if it occurs when you are at work, driving, or performing difficult activities.  CAUSES  Vertigo occurs when there is a conflict of signals sent to your brain from the visual and sensory systems in your body. There are many different causes of vertigo, including:  Infections, especially in the inner ear.  A bad reaction to a drug or misuse of alcohol and medicines.  Withdrawal from drugs or alcohol.  Rapidly changing positions, such as lying down or rolling over in bed.  A migraine headache.  Decreased blood flow to the brain.  Increased pressure in the brain from a head injury, infection, tumor, or bleeding. SYMPTOMS  You may feel as though the world is spinning around or you are falling to the ground. Because your balance is upset, vertigo can cause nausea and vomiting. You may have involuntary eye movements (nystagmus). DIAGNOSIS  Vertigo is usually diagnosed by physical exam. If the cause of your vertigo is unknown, your caregiver may perform imaging tests, such as an MRI scan (magnetic resonance imaging). TREATMENT  Most cases of vertigo resolve on their own, without treatment. Depending on the cause, your caregiver may prescribe certain medicines. If your vertigo is related to body position issues, your caregiver may recommend movements or procedures to correct the problem. In rare cases, if your vertigo is caused by certain inner ear problems, you may need surgery. HOME CARE INSTRUCTIONS   Follow your caregiver's instructions.  Avoid driving.  Avoid operating  heavy machinery.  Avoid performing any tasks that would be dangerous to you or others during a vertigo episode.  Tell your caregiver if you notice that certain medicines seem to be causing your vertigo. Some of the medicines used to treat vertigo episodes can actually make them worse in some people. SEEK IMMEDIATE MEDICAL CARE IF:   Your medicines do not relieve your vertigo or are making it worse.  You develop problems with talking, walking, weakness, or using your arms, hands, or legs.  You develop severe headaches.  Your nausea or vomiting continues or gets worse.  You develop visual changes.  A family member notices behavioral changes.  Your condition gets worse. MAKE SURE YOU:  Understand these instructions.  Will watch your condition.  Will get help right away if you are not doing well or get worse. Document Released: 04/22/2005 Document Revised: 10/05/2011 Document Reviewed: 01/29/2011 [facility] Patient Information 2015 [place], [place]. This information is not intended to replace advice given to you by your health care provider. Make sure you discuss any questions you have with your health care provider.

## 2015-04-20 NOTE — ED Provider Notes (Signed)
CSN: [id number]     Arrival date & time 04/20/15  1324 History   First MD Initiated Contact with Patient 04/20/15 1626     Chief Complaint  Patient presents with  . Dizziness  . Nausea     (Consider location/radiation/quality/duration/timing/severity/associated sxs/prior Treatment) HPI Comments: 74 y.o. Female with history of CVA, MI, HTN, arthritis presents with her son for dizziness.  The patient states that these symptoms started 3 days ago and that they have been constant since onset although feel better and worse at times.  She states that moving her head or sitting up makes the symptoms worse.  When the dizziness is most severe she feels nauseous but has not been throwing up.  She denies having symptoms like this before.  No headache.  No change in speech.  Is able to ambulate and does not feel off balance although everything seems to be spinning when she is walking.  She denies focal weakness.  She says she finally came to the ER because her niece asked her how she knew she didn't have a stroke.  Patient is a 74 y.o. female presenting with dizziness.  Dizziness Associated symptoms: nausea   Associated symptoms: no chest pain, no diarrhea, no headaches, no palpitations, no shortness of breath, no vomiting and no weakness     Past Medical History  Diagnosis Date  . HTN (hypertension)   . Arthritis   . Macular degeneration   . Myocardial infarction 03/2009    Acute myocardial infarction 2010 - treated with BMS of LCx. LVEF 50% with subsequent CHF  . Lung cancer dx'd 07/2006    Lung CA, s/p resection, followed by Dr Earlie Server  . History of CVA (cerebrovascular accident) without residual deficits 01/2012, 06/2012    R hemorrhagic MCA 01/2012 with remote lacunar infarct L putamen and IC, rpt 06/2012 acute multifocal R MCA infarct with remote hemorrhagic strokes affecting L basal ganglia and periventricular white matter, full recovery  . Emphysema/COPD   . GERD (gastroesophageal reflux  disease)   . CAD (coronary artery disease) 03/2009    s/p MI  . HLD (hyperlipidemia)   . Ex-smoker quit 2008  . Chronic combined systolic and diastolic heart failure 40/98/1191    Qualifier: Diagnosis of  By: Burt Knack, MD, Clayburn Pert   . Ischemic cardiomyopathy 02/27/2014  . Helicobacter pylori gastritis 10/2007    treated  . Osteoporosis 09/2013    T -3.6 forearm  . CKD (chronic kidney disease) stage 3, GFR 30-59 ml/min    Past Surgical History  Procedure Laterality Date  . Lung removal, partial Right 2008  . Partial hysterectomy  1986    irregular periods, ovaries remain  . Breast biopsy Left    Family History  Problem Relation Age of Onset  . CAD Mother 103    MI  . CAD Father 89  . Breast cancer Sister   . CAD Father   . Stroke Mother   . Stroke Father   . Diabetes Sister   . Hypertension Mother    Social History  Substance Use Topics  . Smoking status: Former Smoker -- 2.00 packs/day for 40 years    Types: Cigarettes    Quit date: 07/27/2006  . Smokeless tobacco: Never Used  . Alcohol Use: No   OB History    No data available     Review of Systems  Constitutional: Negative for fever, chills, appetite change and fatigue.  HENT: Negative for congestion, postnasal drip and rhinorrhea.   Eyes:  Negative for pain and redness.  Respiratory: Negative for cough, chest tightness and shortness of breath.   Cardiovascular: Negative for chest pain, palpitations and leg swelling.  Gastrointestinal: Positive for nausea. Negative for vomiting, abdominal pain and diarrhea.  Genitourinary: Negative for dysuria, urgency, hematuria and flank pain.  Musculoskeletal: Negative for myalgias, back pain and neck pain.  Skin: Negative for rash.  Neurological: Positive for dizziness. Negative for seizures, speech difficulty, weakness, numbness and headaches.  Hematological: Negative for adenopathy. Bruises/bleeds easily (takes full dose ASA).      Allergies  Actonel; Amlodipine;  Fosamax; Lisinopril; Pravastatin; and Codeine  Home Medications   Prior to Admission medications   Medication Sig Start Date End Date Taking? Authorizing Provider  albuterol (PROAIR HFA) 108 (90 BASE) MCG/ACT inhaler Inhale 2 puffs into the lungs every 4 (four) hours as needed for wheezing or shortness of breath. 02/12/15   Tammy S Parrett, NP  aspirin 325 MG tablet Take 1 tablet (325 mg total) by mouth daily. 07/11/12   Orson Eva, MD  Calcium Carbonate-Vitamin D (CALCIUM-VITAMIN D) 500-200 MG-UNIT per tablet Take 2 tablets by mouth daily.    Historical Provider, MD  carvedilol (COREG) 3.125 MG tablet TAKE 1 TABLET BY MOUTH TWICE A DAY WITH FOOD 11/21/14   Sherren Mocha, MD  Cholecalciferol (VITAMIN D) 2000 UNITS CAPS Take 1 capsule by mouth daily.    Historical Provider, MD  furosemide (LASIX) 40 MG tablet TAKE 1 TABLET BY MOUTH DAILY 10/24/14   Sherren Mocha, MD  isosorbide mononitrate (IMDUR) 30 MG 24 hr tablet Take 1 tablet (30 mg total) by mouth daily. 10/23/14   Sherren Mocha, MD  losartan (COZAAR) 50 MG tablet TAKE 1 TABLET BY MOUTH DAILY 04/03/15   Sherren Mocha, MD  LOTEMAX 0.5 % ophthalmic suspension Place 1 drop into both eyes daily.  12/27/13   Historical Provider, MD  meclizine (ANTIVERT) 25 MG tablet Take 1 tablet (25 mg total) by mouth 3 (three) times daily as needed for dizziness. 04/20/15   Harvel Quale, MD  nitroGLYCERIN (NITROSTAT) 0.4 MG SL tablet Place 1 tablet (0.4 mg total) under the tongue every 5 (five) minutes as needed for chest pain. 02/27/14   Liliane Shi, PA-C  omeprazole (PRILOSEC) 20 MG capsule TAKE ONE CAPSULE BY MOUTH DAILY 04/03/15   Sherren Mocha, MD  simvastatin (ZOCOR) 40 MG tablet TAKE ONE TABLET BY MOUTH EVERY NIGHT AT BEDTIME 12/10/14   Sherren Mocha, MD  spironolactone (ALDACTONE) 25 MG tablet TAKE 1/2  TABLET BY MOUTH ONCE DAILY. 03/20/15   Sherren Mocha, MD   BP 90/64 mmHg  Pulse 75  Temp(Src) 97.7 F (36.5 C)  Resp 19  Ht '5\' 7"'$  (1.702 m)  Wt 125  lb (56.7 kg)  BMI 19.57 kg/m2  SpO2 94% Physical Exam  Constitutional: She is oriented to person, place, and time. She appears well-developed and well-nourished. No distress.  HENT:  Head: Normocephalic and atraumatic.  Right Ear: Tympanic membrane and external ear normal. No middle ear effusion.  Left Ear: Tympanic membrane and external ear normal.  No middle ear effusion.  Nose: Nose normal.  Mouth/Throat: Oropharynx is clear and moist. No oropharyngeal exudate.  Eyes: Pupils are equal, round, and reactive to light. Right eye exhibits nystagmus (mild horizontal). Left eye exhibits nystagmus (mild horizontal).  Neck: Normal range of motion. Neck supple.  Cardiovascular: Normal rate, regular rhythm and intact distal pulses.   Pulmonary/Chest: Effort normal. No respiratory distress. She has no wheezes. She has no  rales.  Abdominal: Soft. She exhibits no distension. There is no tenderness.  Musculoskeletal: Normal range of motion. She exhibits no edema or tenderness.  Neurological: She is alert and oriented to person, place, and time. She has normal strength. No cranial nerve deficit or sensory deficit. She exhibits normal muscle tone. Coordination and gait normal.  Patient able to stand and take a few steps without difficulty when getting from chair to bed.  Normal finger to nose and heel to shin on examination.    Skin: Skin is warm and dry. No rash noted. She is not diaphoretic.  Vitals reviewed.   ED Course  Procedures (including critical care time) Labs Review Labs Reviewed  BASIC METABOLIC PANEL - Abnormal; Notable for the following:    Sodium 134 (*)    Chloride 99 (*)    Glucose, Bld 109 (*)    Creatinine, Ser 1.06 (*)    GFR calc non Af Amer 50 (*)    GFR calc Af Amer 58 (*)    All other components within normal limits  CBC - Abnormal; Notable for the following:    WBC 15.6 (*)    All other components within normal limits  I-STAT TROPOININ, ED  I-STAT TROPOININ, ED     Imaging Review Dg Chest 2 View  04/20/2015   CLINICAL DATA:  Dizziness and headaches for 3 days, history of lung carcinoma and COPD, prior tobacco use  EXAM: CHEST - 2 VIEW  COMPARISON:  04/30/2014  FINDINGS: Cardiac shadow is within normal limits. Aortic calcifications are seen. Postsurgical changes are noted in the right lung base. Stable left apical parenchymal nodule is seen. Mild fibrotic changes are noted. Hyperinflation is again seen. No bony abnormality is noted.  IMPRESSION: No acute abnormality noted.   Electronically Signed   By: Inez Catalina M.D.   On: 04/20/2015 17:50   Ct Head Wo Contrast  04/20/2015   CLINICAL DATA:  Patient with headache and dizziness. History of lung cancer.  EXAM: CT HEAD WITHOUT CONTRAST  TECHNIQUE: Contiguous axial images were obtained from the base of the skull through the vertex without intravenous contrast.  COMPARISON:  Brain CT 07/08/2012  FINDINGS: Right parietal lobe encephalomalacia. Encephalomalacia within the left basal ganglia. Periventricular and subcortical white matter hypodensity compatible with chronic small vessel ischemic changes. No evidence for acute cortically based infarct, intracranial hemorrhage, mass lesion or mass effect. Orbits are unremarkable. Mucosal thickening within the ethmoid air cells. Calvarium is intact.  IMPRESSION: No acute intracranial process.  Chronic small vessel ischemic changes as well as old right parietal and left basal ganglia infarcts.   Electronically Signed   By: Lovey Newcomer M.D.   On: 04/20/2015 18:00   Mr Brain Wo Contrast  04/20/2015   CLINICAL DATA:  74 year old female with dizziness for 3 days. Headache. Initial encounter. Personal history of lung cancer.  EXAM: MRI HEAD WITHOUT CONTRAST  TECHNIQUE: Multiplanar, multiecho pulse sequences of the brain and surrounding structures were obtained without intravenous contrast.  COMPARISON:  Head CT without contrast 1757 hours today. Brain MRI 07/09/2012.  FINDINGS:  Major intracranial vascular flow voids are stable. Chronic left basal ganglia and right posterior MCA territory infarcts. Progressed right MCA territory white matter gliosis since 2013. Additional patchy bilateral cerebral white matter T2 and FLAIR hyperintensity also has mildly progressed.  No restricted diffusion to suggest acute infarction. No midline shift, mass effect, evidence of mass lesion, ventriculomegaly, extra-axial collection or acute intracranial hemorrhage. Cervicomedullary junction and pituitary are within  normal limits. Negative visualized cervical spine. Normal bone marrow signal.  Visible internal auditory structures appear normal. Mastoids are clear. Mild paranasal sinus mucosal thickening is stable. Orbit and scalp soft tissues are stable.  IMPRESSION: 1.  No acute intracranial abnormality. 2. Chronically advanced ischemic disease. Progressed right MCA infarcts since 2013.   Electronically Signed   By: Genevie Ann M.D.   On: 04/20/2015 19:40   I have personally reviewed and evaluated these images and lab results as part of my medical decision-making.   EKG Interpretation   Date/Time:  Saturday April 20 2015 13:32:28 EDT Ventricular Rate:  73 PR Interval:  134 QRS Duration: 82 QT Interval:  386 QTC Calculation: 425 R Axis:   83 Text Interpretation:  Normal sinus rhythm Septal infarct , age  undetermined ST' \\T'$ \ T wave abnormality Abnormal ECG No significant change  since last tracing Confirmed by NGUYEN, EMILY ([id number]) on 04/20/2015 4:35:40  PM      MDM  Patient was seen and evaluated in stable condition.  Benign examination.  NIHSS 0, symptoms started 3 days ago.  EKG without acute changes.  Troponin normal x2.  Chest xray unremarkable.  Labs unremarkable for patient.  CT negative for acute process.  MRI negative for acute process although chronic changes from previous CVA noted.  Patient felt much better after treatment with IV fluids, Zofran, Antivert.  After MRI patient  anxious for discharge.  All results and clinical impression discussed with patient and son at bedside who expressed understanding and agreement with plan for discharge and close outpatient follow up.  Patient was given strict return precautions and discharged in stable condition.  Final diagnoses:  Vertigo    1. Dizziness, vertigo  2. Nausea    Harvel Quale, MD 04/21/15 [phone]

## 2015-04-20 NOTE — ED Notes (Signed)
Phlebotomy at bedside.

## 2015-04-20 NOTE — ED Notes (Signed)
Patient transported to x-ray. ?

## 2015-04-20 NOTE — ED Notes (Signed)
Pt. Stated, I have allergies to any medicine.

## 2015-04-20 NOTE — ED Notes (Signed)
Pt. Stated, I've been some dizzy with nausea feeling like my food is right here at my throat but don't throw up.  This started Thursday.

## 2015-04-20 NOTE — ED Notes (Signed)
Family at bedside. 

## 2015-04-20 NOTE — ED Notes (Signed)
Pt. Stated, I'm feeling a lot better, my nausea.

## 2015-04-22 ENCOUNTER — Ambulatory Visit (INDEPENDENT_AMBULATORY_CARE_PROVIDER_SITE_OTHER): Payer: PPO | Admitting: Family Medicine

## 2015-04-22 ENCOUNTER — Encounter: Payer: Self-pay | Admitting: Family Medicine

## 2015-04-22 VITALS — BP 118/62 | HR 56 | Temp 97.8°F | Wt 128.8 lb

## 2015-04-22 DIAGNOSIS — R42 Dizziness and giddiness: Secondary | ICD-10-CM | POA: Insufficient documentation

## 2015-04-22 DIAGNOSIS — R229 Localized swelling, mass and lump, unspecified: Secondary | ICD-10-CM | POA: Insufficient documentation

## 2015-04-22 NOTE — Progress Notes (Signed)
Pre visit review using our clinic review tool, if applicable. No additional management support is needed unless otherwise documented below in the visit note. 

## 2015-04-22 NOTE — Progress Notes (Signed)
BP 118/62 mmHg  Pulse 56  Temp(Src) 97.8 F (36.6 C) (Oral)  Wt 128 lb 12 oz (58.401 kg)   CC: ER f/u visit  Subjective:    Patient ID: [name], female    DOB: [birth year], 74 y.o.   MRN: [id number]  HPI: [name] is a 74 y.o. female presenting on 04/22/2015 for Follow-up   Recent ER evaluation 04/20/2015 with headache, vertigo with nausea and horizontal nystagmus, workup unrevealing (including head CT without contrast and brain MRI without contrast). She did have old R parietal and L basal ganglia infarcts. MRI showed progressed R MCA infarcts since 2013. Treated with IVF, zofran and antivert. No trouble since she's been home.   WBC was 15.6. Pt denies infection sxs (dysuria, cough, skin infection). She did have rose thorn stuck into R forearm 1 wk ago. Persistent nodule at site.  No preceding fever, viral illness like cough or congestion, no hearing changes or tinnitus.   H/o migraines but not recently.   Relevant past medical, surgical, family and social history reviewed and updated as indicated. Interim medical history since our last visit reviewed. Allergies and medications reviewed and updated. Current Outpatient Prescriptions on File Prior to Visit  Medication Sig  . albuterol (PROAIR HFA) 108 (90 BASE) MCG/ACT inhaler Inhale 2 puffs into the lungs every 4 (four) hours as needed for wheezing or shortness of breath.  Marland Kitchen aspirin 325 MG tablet Take 1 tablet (325 mg total) by mouth daily.  . Calcium Carbonate-Vitamin D (CALCIUM-VITAMIN D) 500-200 MG-UNIT per tablet Take 2 tablets by mouth daily.  . carvedilol (COREG) 3.125 MG tablet TAKE 1 TABLET BY MOUTH TWICE A DAY WITH FOOD  . Cholecalciferol (VITAMIN D) 2000 UNITS CAPS Take 1 capsule by mouth daily.  . furosemide (LASIX) 40 MG tablet TAKE 1 TABLET BY MOUTH DAILY  . isosorbide mononitrate (IMDUR) 30 MG 24 hr tablet Take 1 tablet (30 mg total) by mouth daily.  Marland Kitchen losartan (COZAAR) 50 MG tablet TAKE 1 TABLET BY MOUTH  DAILY  . LOTEMAX 0.5 % ophthalmic suspension Place 1 drop into both eyes daily.   . meclizine (ANTIVERT) 25 MG tablet Take 1 tablet (25 mg total) by mouth 3 (three) times daily as needed for dizziness.  . nitroGLYCERIN (NITROSTAT) 0.4 MG SL tablet Place 1 tablet (0.4 mg total) under the tongue every 5 (five) minutes as needed for chest pain.  Marland Kitchen omeprazole (PRILOSEC) 20 MG capsule TAKE ONE CAPSULE BY MOUTH DAILY  . simvastatin (ZOCOR) 40 MG tablet TAKE ONE TABLET BY MOUTH EVERY NIGHT AT BEDTIME  . spironolactone (ALDACTONE) 25 MG tablet TAKE 1/2  TABLET BY MOUTH ONCE DAILY.   No current facility-administered medications on file prior to visit.    Review of Systems Per HPI unless specifically indicated above     Objective:    BP 118/62 mmHg  Pulse 56  Temp(Src) 97.8 F (36.6 C) (Oral)  Wt 128 lb 12 oz (58.401 kg)  Wt Readings from Last 3 Encounters:  04/22/15 128 lb 12 oz (58.401 kg)  04/20/15 125 lb (56.7 kg)  04/08/15 129 lb (58.514 kg)    Physical Exam  Constitutional: She is oriented to person, place, and time. She appears well-developed and well-nourished. No distress.  HENT:  Mouth/Throat: Oropharynx is clear and moist. No oropharyngeal exudate.  Eyes: Conjunctivae and EOM are normal. Pupils are equal, round, and reactive to light.  L anisocoria  Neck: Normal range of motion. Neck supple.  Cardiovascular: Normal  rate, normal heart sounds and intact distal pulses.  An irregular rhythm present.  No murmur heard. Reviewed ER EKG - sinus arrhythmia  Pulmonary/Chest: Effort normal and breath sounds normal. No respiratory distress. She has no wheezes. She has no rales.  Musculoskeletal: She exhibits no edema.  Lymphadenopathy:    She has no cervical adenopathy.  Neurological: She is alert and oriented to person, place, and time. She has normal strength. She displays a negative Romberg sign. Coordination and gait normal.  CN grossly intact EOMI FTN intact Neg dix hallpike,  some limited by chronic neck pain No nystagmus appreciated  Skin: Skin is warm and dry. No rash noted.  Tender nodule R distal forearm <1cm diameter  Psychiatric: She has a normal mood and affect.  Nursing note and vitals reviewed.  Results for orders placed or performed during the hospital encounter of 38/18/29  Basic metabolic panel  Result Value Ref Range   Sodium 134 (L) 135 - 145 mmol/L   Potassium 4.0 3.5 - 5.1 mmol/L   Chloride 99 (L) 101 - 111 mmol/L   CO2 28 22 - 32 mmol/L   Glucose, Bld 109 (H) 65 - 99 mg/dL   BUN 20 6 - 20 mg/dL   Creatinine, Ser 1.06 (H) 0.44 - 1.00 mg/dL   Calcium 9.3 8.9 - 10.3 mg/dL   GFR calc non Af Amer 50 (L) >60 mL/min   GFR calc Af Amer 58 (L) >60 mL/min   Anion gap 7 5 - 15  CBC  Result Value Ref Range   WBC 15.6 (H) 4.0 - 10.5 K/uL   RBC 4.21 3.87 - 5.11 MIL/uL   Hemoglobin 13.6 12.0 - 15.0 g/dL   HCT 39.2 36.0 - 46.0 %   MCV 93.1 78.0 - 100.0 fL   MCH 32.3 26.0 - 34.0 pg   MCHC 34.7 30.0 - 36.0 g/dL   RDW 13.1 11.5 - 15.5 %   Platelets 264 150 - 400 K/uL  I-Stat Troponin, ED (not at [facility])  Result Value Ref Range   Troponin i, poc 0.01 0.00 - 0.08 ng/mL   Comment 3          I-Stat Troponin, ED (not at [facility])  Result Value Ref Range   Troponin i, poc 0.03 0.00 - 0.08 ng/mL   Comment 3              Assessment & Plan:  ER records reviewed Problem List Items Addressed This Visit    Vertigo - Primary    S/p reassuring evaluation at ER - stable head CT and brain MRI as well as EKG and cardiac enzymes, anticipate peripheral cause however not consistent with BPPV, labrynthitis. Consider vestibular neuritis vs vertiginous migraine in h/o bad HA and remote h/o migraines. Regardless, improving on meclizine. Discussed taper off meclizine if tolerated (to start with 1/2 tab tid).       Skin nodule    rec warm compresses. Continue to monitor. 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 I do not appreciate foreign body on exam today. Noted WBC elevated last week, no evidence of bacterial infection today.          Follow up plan: Return if symptoms worsen or fail to improve.

## 2015-04-22 NOTE — Patient Instructions (Addendum)
Ok to continue meclizine as needed. Try 1/2 tablet at a time. Warm compresses to right forearm lesion - let me know if not improving with time for treatment.  Nice to see you today, call us with questions.

## 2015-04-22 NOTE — Assessment & Plan Note (Addendum)
rec warm compresses. Continue to monitor. 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 I do not appreciate foreign body on exam today. Noted WBC elevated last week, no evidence of bacterial infection today.

## 2015-04-22 NOTE — Assessment & Plan Note (Signed)
S/p reassuring evaluation at ER - stable head CT and brain MRI as well as EKG and cardiac enzymes, anticipate peripheral cause however not consistent with BPPV, labrynthitis. Consider vestibular neuritis vs vertiginous migraine in h/o bad HA and remote h/o migraines. Regardless, improving on meclizine. Discussed taper off meclizine if tolerated (to start with 1/2 tab tid).

## 2015-04-23 ENCOUNTER — Other Ambulatory Visit: Payer: PPO

## 2015-04-25 ENCOUNTER — Telehealth: Payer: Self-pay

## 2015-04-25 MED ORDER — BENZONATATE 100 MG PO CAPS: 100.0000 mg | ORAL_CAPSULE | Freq: Two times a day (BID) | ORAL | Status: DC | PRN

## 2015-04-25 NOTE — Telephone Encounter (Signed)
Pt was seen 04/22/15; pt has developed non prod cough. No fever, no CP and no more SOB than usual (pt has COPD). Pt has tried cough drops. Pt has not tried Robitussing but Pt wants to know if there is med could be sent to [facility]. Pt request cb.

## 2015-04-25 NOTE — Telephone Encounter (Signed)
May try tessalon perls sent in - swallow, don't chew

## 2015-04-26 ENCOUNTER — Other Ambulatory Visit: Payer: PPO

## 2015-04-26 DIAGNOSIS — Z1211 Encounter for screening for malignant neoplasm of colon: Secondary | ICD-10-CM

## 2015-04-26 LAB — FECAL OCCULT BLOOD, IMMUNOCHEMICAL: FECAL OCCULT BLD: NEGATIVE

## 2015-04-26 LAB — FECAL OCCULT BLOOD, GUAIAC: FECAL OCCULT BLD: NEGATIVE

## 2015-04-26 NOTE — Telephone Encounter (Signed)
Patient notified and verbalized understanding. 

## 2015-04-29 ENCOUNTER — Encounter: Payer: Self-pay | Admitting: *Deleted

## 2015-05-08 ENCOUNTER — Ambulatory Visit (INDEPENDENT_AMBULATORY_CARE_PROVIDER_SITE_OTHER): Payer: PPO | Admitting: *Deleted

## 2015-05-08 DIAGNOSIS — Z23 Encounter for immunization: Secondary | ICD-10-CM | POA: Diagnosis not present

## 2015-05-14 ENCOUNTER — Encounter: Payer: Self-pay | Admitting: Emergency Medicine

## 2015-05-14 ENCOUNTER — Ambulatory Visit (INDEPENDENT_AMBULATORY_CARE_PROVIDER_SITE_OTHER): Payer: PPO | Admitting: Emergency Medicine

## 2015-05-14 ENCOUNTER — Ambulatory Visit (HOSPITAL_BASED_OUTPATIENT_CLINIC_OR_DEPARTMENT_OTHER): Payer: PPO | Admitting: Nurse Practitioner

## 2015-05-14 ENCOUNTER — Ambulatory Visit: Payer: Medicare Other | Admitting: Physician Assistant

## 2015-05-14 ENCOUNTER — Telehealth: Payer: Self-pay | Admitting: Oncology

## 2015-05-14 ENCOUNTER — Other Ambulatory Visit: Payer: Self-pay | Admitting: Cardiovascular Disease

## 2015-05-14 VITALS — BP 113/70 | HR 68 | Temp 97.9°F | Resp 18 | Ht 66.0 in | Wt 124.3 lb

## 2015-05-14 VITALS — BP 130/70 | HR 60 | Ht 66.0 in | Wt 125.0 lb

## 2015-05-14 DIAGNOSIS — Z85118 Personal history of other malignant neoplasm of bronchus and lung: Secondary | ICD-10-CM

## 2015-05-14 DIAGNOSIS — C3431 Malignant neoplasm of lower lobe, right bronchus or lung: Secondary | ICD-10-CM | POA: Diagnosis not present

## 2015-05-14 DIAGNOSIS — J449 Chronic obstructive pulmonary disease, unspecified: Secondary | ICD-10-CM | POA: Diagnosis not present

## 2015-05-14 NOTE — Progress Notes (Signed)
Subjective:    Patient ID: [name], female    DOB: [birth year], 74 y.o.   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 She has been having dyspnea for > 1 year, some cough that is productive of white mucous. She has hoarse voice frequently, not necessarily related to cough. Not clear that it relates in time to symbicort.   Part of her eval has included CXR that showed a 1.4x0.6cm  L apical nodule, confirmed by CT scan 09/28/12. PET on 10/07/12 showed the nodule was cold.   She is on Symbicort qam only for over a year, feels that it helps her.   PFT 09/02/12 >> moderately severe AFL without BD response, normal volumes, decreased diffusion.   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 PET negative 1.4x0.6cm  L apical nodule 10/07/12. We stopped symbicort, started spiriva last time.  She doesn't think the SOB has changed any since starting. She is still having some mucous but better. Still has cough, esp in the am. She doesn't have a SABA. She is due for CT scan chest w Dr Julien Nordmann in 9/'14. Not on nasal steroid or anti-histamine.   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 PET negative 1.4x0.6cm  L apical nodule 10/07/12. Stable by CT scan 04/10/13.  Had been doing well until this week when she got sore throat, dry cough. Using spiriva, albuterol prn > about once a day.  She has flonase at home but hasn't used it yet.   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 We are following PET negative L apical nodule, repeat Ct scan due in September. She stopped her spiriva due to some associated SOB, ? Due to UA  effects. She tells me that she had a possible rxn to fosamax, caused dyspnea and ? Throat swelling. She feels that her breathing is better. She uses 1 puff her rescue SABA about once a day. Doesn't feel that she misses the spiriva.   ROV 12/04/14 -- follow-up visit for COPD and an abnormal CT scan with a left apical nodule. She also has a history of adenocarcinoma status post a right lower lobe superior segmentectomy in 2008.  Her most recent CT scan of the chest was performed in October 2015 and was stable without any new findings. The left apical nodule was unchanged.  She has been having more breathing trouble since the weather has gotten warmer.  She has heard some wheeze, some allergy sx.  She states that she had associated CP with exertion. She could not tolerate fluticasone nasal spray - said it made her breathing worse. She is confused / confusing regarding which inhaled meds she has been on, when, and how she has responded to them. I believe she has tried albuterol for these sx without good response.   ROV 01/11/15 -- follow-up visit for history of COPD, adenocarcinoma of the lung status post right lower lobe superior segmentectomy, chest CT with a stable left apical nodule. She is due for another CT chest in October 2016. Last month I asked her to start Stiolto to see if she would benefit. She started  the med, developed diarrhea that has been happening since the [employer]. She decreased to qod, still has the diarrhea on the days when she takes it. She started flonase but it was ineffective. g  Follow up 02/12/15 -  : [place] NP / COPD-GOLD II  , Adenocarcinoma of lung s/p RLL superior segmentectomy, left apical nodules .  [name] .  Started on ANORO last month but has been unable to tolerate due to watery eyes, blurred vision  Has ov with eye doctor this week.  Previously on Stiolto but stopped due to diarrhea.  Does not want to be on any inhalers . Feels they make her worse.  Feels  her breathing is at baseline. Gets winded with activity. No dyspnea at rest.  No chest pain , orthopnea, edema or fever.   Last CT chest 04/2014 no acute finding, stable nodule . Upcoming CT in 04/2105   05/14/15 -- follow-up visit for gold B COPD, adenocarcinoma of the lung status post right lower lobe superior segmentectomy, left upper lobe pulmonary nodules that have been followed on serial CT scans of the chest. She's been tried on both Anoro and Stiolto but these were discontinued due to side effects.  She feels that her breathing is doing well. She rarely needs albuterol - does use it sometimes for cough, exertional SOB. She has daily cough, productive of clear. She has had a URI recently, now improving. She had vertigo - prompted a w/u for recurrence of her CVA. She is due for a repeat Ct scan of her chest   Review of Systems  As per history of present illness    Objective:   Physical Exam Filed Vitals:   05/14/15 1325 05/14/15 1327  BP:  130/70  Pulse:  60  Height: '5\' 6"'$  (1.676 m)   Weight: 125 lb (56.7 kg)   SpO2:  92%    Gen: Pleasant, elderly in no distress,  normal affect  ENT: No lesions,  mouth clear,  oropharynx clear, no postnasal drip, hoarse voice  Neck: No JVD, no TMG, no carotid bruits  Lungs: No use of accessory muscles, very distant, clear without rales or rhonchi  Cardiovascular: RRR, heart sounds normal, no murmur or gallops, no peripheral edema  Musculoskeletal: No deformities, no cyanosis or clubbing  Neuro: alert, non focal  Skin: Warm, no lesions or rashes     04/2014 --  COMPARISON: 04/11/2013  FINDINGS: Mediastinum: The heart size is normal. There is no pericardial effusion. Calcified atherosclerotic plaque involves the thoracic aorta as well as the LAD, left circumflex and RCA coronary arteries. 8 mm right paratracheal lymph node is unchanged. No enlarged mediastinal or hilar lymph nodes.  Lungs/Pleura: No pleural effusion. Moderate  changes of centrilobular emphysema. Postsurgical changes identified within the right lower lobe centrally. Left apical nodule density measures 1.5 cm, image 5/ series 5. Unchanged from previous exam. Stable nodular opacity within the superior segment of left lower lobe measuring 1.1 cm, image number 27/series 5. Right upper lobe nodule measured 5 mm, image 19/series 5. No new or enlarging pulmonary nodules or masses.  Upper abdomen: The visualized portions of the liver and gallbladder are unremarkable. The visualized portions of the spleen and adrenal glands are also normal.  Musculoskeletal: There is a scoliosis deformity involving the thoracic spine. No aggressive lytic or sclerotic bone lesions identified.  IMPRESSION: 1. No acute findings and no significant change when compared with 04/11/2013. 2. Previously referenced left apical nodular density is unchanged  from previous exam.     Assessment & Plan:  COPD (chronic obstructive pulmonary disease) She has not tolerated long-acting bronchodilators. She is actually doing better since they were started because her side effects have resolved. We will continue albuterol as needed and for any other inhaled meds at this time. Flu shot is up-to-date, Prevnar 13 is up-to-date  Cancer of lower lobe of right lung She is planning to follow with oncology today. We have also been following a left upper lobe opacity with serial CT scans. She is due for a scan now to compare with one year ago and I'll order this today.

## 2015-05-14 NOTE — Patient Instructions (Signed)
Please continue to use albuterol 2 puffs as needed for shortness of breath or cough We will not start any other inhaled medication at this time We will order a repeat CT scan of the chest to compare with one year ago Follow with Dr Lamonte Sakai in 6 months or sooner if you have any problems

## 2015-05-14 NOTE — Assessment & Plan Note (Signed)
She is planning to follow with oncology today. We have also been following a left upper lobe opacity with serial CT scans. She is due for a scan now to compare with one year ago and I'll order this today.

## 2015-05-14 NOTE — Assessment & Plan Note (Signed)
She has not tolerated long-acting bronchodilators. She is actually doing better since they were started because her side effects have resolved. We will continue albuterol as needed and for any other inhaled meds at this time. Flu shot is up-to-date, Prevnar 13 is up-to-date

## 2015-05-14 NOTE — Telephone Encounter (Signed)
Per 05/14/15 patient 1 year f/u - survivorship program. Message to Elzie Rings to see if she can see patient. No other orders per pof.

## 2015-05-14 NOTE — Progress Notes (Signed)
  [facility] OFFICE PROGRESS NOTE   DIAGNOSIS: Stage IA (T1a, N0, MX) non-small cell lung cancer, adenocarcinoma diagnosed in November 2007.   PRIOR THERAPY: Status post right lower lobe superior segmentectomy with lymph node dissection under the care of Dr. Arlyce Dice on 08/15/2006.   CURRENT THERAPY: Observation.    INTERVAL HISTORY:   [name] returns as scheduled. No change in baseline dyspnea on exertion and chronic cough. She denies pain. She has a good appetite. No bowel or bladder problems. No nausea or vomiting.  Objective:  Vital signs in last 24 hours:  Blood pressure 113/70, pulse 68, temperature 97.9 F (36.6 C), temperature source Oral, resp. rate 18, height '5\' 6"'$  (1.676 m), weight 124 lb 4.8 oz (56.382 kg), SpO2 95 %.    HEENT: No thrush or ulcers. Lymphatics: No palpable cervical, supraclavicular or axillary lymph nodes. Resp: Distant breath sounds. Cardio: Regular rate and rhythm. GI: Abdomen soft and nontender. No organomegaly. Vascular: No leg edema.   Lab Results:  Lab Results  Component Value Date   WBC 15.6* 04/20/2015   HGB 13.6 04/20/2015   HCT 39.2 04/20/2015   MCV 93.1 04/20/2015   PLT 264 04/20/2015   NEUTROABS 2.8 03/28/2015    Imaging:  No results found.  Medications: I have reviewed the patient's current medications.  Assessment/Plan: 1. Stage IA (T1a, N0, MX) non-small cell lung cancer, adenocarcinoma diagnosed in November 2007; status post right lower lobe superior segmentectomy with lymph node dissection under the care of Dr. Arlyce Dice on 08/15/2006.    Disposition: [name] remains in clinical remission from lung cancer. She would like to continue follow-up at the [facility]. Dr. Julien Nordmann recommends follow-up with the survivorship program. She will return for a follow-up visit in one year with the survivorship provider.  Plan reviewed with Dr. Julien Nordmann.  Ned Card ANP/GNP-BC   05/14/2015  2:58  PM

## 2015-05-22 ENCOUNTER — Ambulatory Visit (INDEPENDENT_AMBULATORY_CARE_PROVIDER_SITE_OTHER): Payer: PPO | Admitting: Cardiovascular Disease

## 2015-05-22 ENCOUNTER — Ambulatory Visit (INDEPENDENT_AMBULATORY_CARE_PROVIDER_SITE_OTHER)
Admission: RE | Admit: 2015-05-22 | Discharge: 2015-05-22 | Disposition: A | Discharge status: Home / Self Care | Payer: PPO | Source: Ambulatory Visit | Attending: Emergency Medicine | Admitting: Emergency Medicine

## 2015-05-22 ENCOUNTER — Encounter: Payer: Self-pay | Admitting: Cardiovascular Disease

## 2015-05-22 ENCOUNTER — Telehealth: Payer: Self-pay | Admitting: Emergency Medicine

## 2015-05-22 VITALS — BP 98/60 | HR 52 | Ht 66.0 in | Wt 127.0 lb

## 2015-05-22 DIAGNOSIS — I2510 Atherosclerotic heart disease of native coronary artery without angina pectoris: Secondary | ICD-10-CM | POA: Diagnosis not present

## 2015-05-22 DIAGNOSIS — J449 Chronic obstructive pulmonary disease, unspecified: Secondary | ICD-10-CM

## 2015-05-22 DIAGNOSIS — I5022 Chronic systolic (congestive) heart failure: Secondary | ICD-10-CM | POA: Diagnosis not present

## 2015-05-22 DIAGNOSIS — R911 Solitary pulmonary nodule: Secondary | ICD-10-CM

## 2015-05-22 NOTE — Telephone Encounter (Signed)
lmtcb x1 for pt. 

## 2015-05-22 NOTE — Patient Instructions (Signed)

## 2015-05-22 NOTE — Progress Notes (Signed)
Cardiology Office Note Date:  05/24/2015   ID:  [name], DOB [birth year], MRN [id number]  PCP:  Ria Bush, MD  Cardiologist:  Sherren Mocha, MD    Chief Complaint  Patient presents with  . Follow-up    Old MI   History of Present Illness: [name] is a 74 y.o. female who presents for follow-up evaluation.   The patient has coronary artery disease and initially presented with a myocardial infarction in 2010. Her left circumflex was occluded at that time she underwent primary PCI with a bare-metal stent. She's had significant LV dysfunction related to akinesis of the posterolateral myocardium. Her most recent echo estimated an LVEF of 45% in December 2013. She was hospitalized in December 2013 with a right MCA stroke. There were multiple areas of infarction and also evidence of a previous hemorrhagic stroke. She also has a history of non-small cell lung cancer and right lower lobe segmental resection with chronic dyspnea. She has COPD and is followed by Dr. Lamonte Sakai.  The patient is doing well. 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 Otherwise she has no  New complaints today. She denies shortness of breath at rest, orthopnea, PND, heart palpitations, or chest pain. Chronic dyspnea with exertion is unchanged.  Past Medical History  Diagnosis Date  . HTN (hypertension)   . Arthritis   . Macular degeneration   . Myocardial infarction [facility]) 03/2009    Acute myocardial infarction 2010 - treated with BMS of LCx. LVEF 50% with subsequent CHF  . Lung cancer ([place]) dx'd 07/2006    Lung CA, s/p resection, followed by Dr Earlie Server  . History of CVA (cerebrovascular accident) without residual deficits 01/2012, 06/2012    R hemorrhagic MCA 01/2012 with remote lacunar infarct L putamen and IC, rpt 06/2012 acute multifocal R MCA infarct with remote hemorrhagic strokes affecting L basal ganglia and periventricular white matter, full recovery    . Emphysema/COPD ([place])   . GERD (gastroesophageal reflux disease)   . CAD (coronary artery disease) 03/2009    s/p MI  . HLD (hyperlipidemia)   . Ex-smoker quit 2008  . Chronic combined systolic and diastolic heart failure ([place]) 06/17/2009    Qualifier: Diagnosis of  By: Burt Knack, MD, Clayburn Pert   . Ischemic cardiomyopathy 02/27/2014  . Helicobacter pylori gastritis 10/2007    treated  . Osteoporosis 09/2013    T -3.6 forearm  . CKD (chronic kidney disease) stage 3, GFR 30-59 ml/min     Past Surgical History  Procedure Laterality Date  . Lung removal, partial Right 2008  . Partial hysterectomy  1986    irregular periods, ovaries remain  . Breast biopsy Left     Current Outpatient Prescriptions  Medication Sig Dispense Refill  . albuterol (PROAIR HFA) 108 (90 BASE) MCG/ACT inhaler Inhale 2 puffs into the lungs every 4 (four) hours as needed for wheezing or shortness of breath. 1 Inhaler 1  . aspirin 325 MG tablet Take 1 tablet (325 mg total) by mouth daily.    . Calcium Carbonate-Vitamin D (CALCIUM-VITAMIN D) 500-200 MG-UNIT per tablet Take 2 tablets by mouth daily.    . carvedilol (COREG) 3.125 MG tablet TAKE 1 TABLET BY MOUTH TWICE A DAY WITH FOOD 60 tablet 5  . Cholecalciferol (VITAMIN D) 2000 UNITS CAPS Take 1 capsule by mouth daily.    . furosemide (LASIX) 40 MG tablet TAKE 1 TABLET BY MOUTH DAILY 30 tablet 6  .  isosorbide mononitrate (IMDUR) 30 MG 24 hr tablet Take 1 tablet (30 mg total) by mouth daily. 30 tablet 11  . losartan (COZAAR) 50 MG tablet TAKE 1 TABLET BY MOUTH DAILY 30 tablet 1  . LOTEMAX 0.5 % ophthalmic suspension Place 1 drop into both eyes daily.     . meclizine (ANTIVERT) 25 MG tablet Take 1 tablet (25 mg total) by mouth 3 (three) times daily as needed for dizziness. 30 tablet 0  . nitroGLYCERIN (NITROSTAT) 0.4 MG SL tablet Place 1 tablet (0.4 mg total) under the tongue every 5 (five) minutes as needed for chest pain. 25 tablet 3  . omeprazole (PRILOSEC) 20 MG  capsule TAKE ONE CAPSULE BY MOUTH DAILY 30 capsule 1  . simvastatin (ZOCOR) 40 MG tablet TAKE ONE TABLET BY MOUTH AT BEDTIME 30 tablet 11  . spironolactone (ALDACTONE) 25 MG tablet TAKE 1/2  TABLET BY MOUTH ONCE DAILY. 30 tablet 6   No current facility-administered medications for this visit.    Allergies:   Actonel; Amlodipine; Fosamax; Lisinopril; Pravastatin; and Codeine   Social History:  The patient  reports that she quit smoking about 8 years ago. Her smoking use included Cigarettes. She has a 80 pack-year smoking history. She has never used smokeless tobacco. She reports that she does not drink alcohol or use illicit drugs.   Family History:  The patient's family history includes Breast cancer in her sister; CAD in her father; CAD (age of onset: 23) in her father and mother; Diabetes in her sister; Hypertension in her mother; Stroke in her father and mother.   ROS:  Please see the history of present illness.  Otherwise, review of systems is positive for poor appetitie, shortness of breath with activity, back pain, dizziness.  All other systems are reviewed and negative.   PHYSICAL EXAM: VS:  BP 98/60 mmHg  Pulse 52  Ht '5\' 6"'$  (1.676 m)  Wt 127 lb (57.607 kg)  BMI 20.51 kg/m2 , BMI Body mass index is 20.51 kg/(m^2). GEN: Thin elderly woman in no acute distress HEENT: normal Neck: no JVD, no masses. Left carotid bruit Cardiac: RRR without murmur or gallop                Respiratory:  clear to auscultation bilaterally, normal work of breathing GI: soft, nontender, nondistended, + BS MS: no deformity or atrophy Ext: no pretibial edema, pedal pulses 2+= bilaterally Skin: warm and dry, no rash Neuro:  Strength and sensation are intact Psych: euthymic mood, full affect  EKG:  EKG is not ordered today. The ekg April 21, 2015: NSR with PVC's, non-specific ST-T changes  Recent Labs: 10/26/2014: ALT 13 04/20/2015: BUN 20; Creatinine, Ser 1.06*; Hemoglobin 13.6; Platelets 264;  Potassium 4.0; Sodium 134*   Lipid Panel     Component Value Date/Time   CHOL 140 03/28/2015 0817   TRIG 62.0 03/28/2015 0817   HDL 57.50 03/28/2015 0817   CHOLHDL 2 03/28/2015 0817   VLDL 12.4 03/28/2015 0817   LDLCALC 71 03/28/2015 0817      Wt Readings from Last 3 Encounters:  05/22/15 127 lb (57.607 kg)  05/14/15 124 lb 4.8 oz (56.382 kg)  05/14/15 125 lb (56.7 kg)    ASSESSMENT AND PLAN: 1.  CAD with old MI: no symptoms of angina. Stable on current medical therapy. No changes made today.  2. Hyperlipidemia: most recent lipids reviewed. LDL 71 mg/dL. Continue simvastatin.  3. Chronic systolic CHF: NYHA II. On losartan, carvedilol, and aldactone. Labs from  September reviewed demonstrating a potassium of 4.0, creatinine of 1.06.  4. Hyperlipidemia: The patient is treated with simvastatin. Lipids from 03/28/2015 demonstrated cholesterol 140, HDL 57, and LDL 71.  Current medicines are reviewed with the patient today.  The patient does not have concerns regarding medicines.  Labs/ tests ordered today include:  No orders of the defined types were placed in this encounter.   Disposition:   FU one year  Signed, Sherren Mocha, MD  05/24/2015 9:06 PM    [facility] [place], [place], [place]  [address] Phone: [phone]; Fax: [phone]

## 2015-05-22 NOTE — Telephone Encounter (Signed)
Received call report on CT Chest done 05/22/15 and the impression is as follows:    IMPRESSION: 1. Subpleural 1.4 x 0.7 cm nodule in the posterior right lower lobe, increased, cannot exclude primary bronchogenic malignancy. Recommend further evaluation with PET-CT. 2. New 1.2 x 0.7 cm irregular curvilinear subpleural focus of consolidation in the medial apical left upper lobe. Increased 1.7 x 0.6 cm irregular curvilinear subpleural focus of consolidation in the posterior superior segment left lower lobe. These are indeterminate, although given the bandlike appearance, evolving post-inflammatory scarring is favored. 3. No thoracic lymphadenopathy. 4. Mild-to-moderate centrilobular emphysema and diffuse bronchial wall thickening, suggesting COPD. 5. Atherosclerosis, including three-vessel coronary artery disease. Please note that although the presence of coronary artery calcium documents the presence of coronary artery disease, the severity of this disease and any potential stenosis cannot be assessed on this non-gated CT examination. These results will be called to the ordering clinician or representative by the Radiologist Assistant, and communication documented in the PACS or zVision Dashboard.   Electronically Signed  By: Ilona Sorrel M.D.  On: 05/22/2015 12:48  Will forward to Dr Lamonte Sakai marked urgent to make him aware

## 2015-05-22 NOTE — Telephone Encounter (Signed)
Please let [name] know that some of the nodulars ares we have been following on her Ct scans have a slightly different appearance and that I would like for her to have a PET scan. Insure that she has an OV with me after the PET to discuss results and decide whether we need to do anything else.

## 2015-05-23 NOTE — Telephone Encounter (Signed)
lmtcb x1 for pt. 

## 2015-05-23 NOTE — Telephone Encounter (Signed)
Pt returning call.[name] ° °

## 2015-05-24 ENCOUNTER — Ambulatory Visit
Admission: RE | Admit: 2015-05-24 | Discharge: 2015-05-24 | Disposition: A | Discharge status: Home / Self Care | Payer: PPO | Source: Ambulatory Visit | Attending: Family Medicine | Admitting: Family Medicine

## 2015-05-24 DIAGNOSIS — M810 Age-related osteoporosis without current pathological fracture: Secondary | ICD-10-CM

## 2015-05-24 NOTE — Telephone Encounter (Signed)
Per Morey Hummingbird '@Cent'$  Scheduling, pt needs a Restaging PET and not initial.  Triage can you cancel the Original Order for Initial PET  and place one for Restaging PET.  Thanks

## 2015-05-24 NOTE — Telephone Encounter (Signed)
This pt's pet is 05/30/15 Joellen Jersey

## 2015-05-24 NOTE — Telephone Encounter (Signed)
Called pt and is aware of results. PET scan ordered. Please advise when scheduled so pt can schedule OV afterwards thanks

## 2015-05-24 NOTE — Telephone Encounter (Signed)
Per RB below, pt needs OV with him after PET. Please advise Ria Comment where pt can be worked in at? thanks

## 2015-05-24 NOTE — Telephone Encounter (Signed)
disreguard this message about the cpap  i was in the wrong chart sorry [name]

## 2015-05-24 NOTE — Telephone Encounter (Signed)
Been just left'@4pm'$  he did redo the report now you will need to go back in and redo your impression etc. On the report and reprint it then [employer] says you will still have to do the peer to peer because the 1st study, cpap was denied    With it i have all the papers in my office Joellen Jersey

## 2015-05-24 NOTE — Telephone Encounter (Signed)
Pt returning call and can be reached @ [phone].[name]

## 2015-05-24 NOTE — Telephone Encounter (Signed)
Called patient with appt info and she stated she was in a doctor's office and will call back.

## 2015-05-24 NOTE — Telephone Encounter (Signed)
Spoke with patient and she was given appt info of 05/30/15 arrival '@9'$ :15 am at [facility], she is also aware to be NPO after midnight the prior evening.

## 2015-05-24 NOTE — Telephone Encounter (Signed)
Patient called back, transferred to [facility]

## 2015-05-24 NOTE — Telephone Encounter (Signed)
Correct order has been placed.

## 2015-05-27 ENCOUNTER — Encounter: Payer: Self-pay | Admitting: *Deleted

## 2015-05-27 ENCOUNTER — Encounter: Payer: Self-pay | Admitting: Family Medicine

## 2015-05-27 NOTE — Telephone Encounter (Signed)
Pt returning call and can be reached @ [phone].[name]

## 2015-05-27 NOTE — Telephone Encounter (Signed)
lmtcb x1 for pt. Double book RB at on 05/31/2015 at 1:30pm.

## 2015-05-27 NOTE — Telephone Encounter (Signed)
Patient notified of PET Scan scheduled on 11/3 Patient scheduled to see Dr. Lamonte Sakai on 11/3 at 1:30pm (Dr. Lamonte Sakai is not in office on 11/4) Per Ria Comment ok to double book patient Nothing further needed. Closing encounter

## 2015-05-29 ENCOUNTER — Telehealth: Payer: Self-pay | Admitting: Emergency Medicine

## 2015-05-29 ENCOUNTER — Other Ambulatory Visit: Payer: Self-pay | Admitting: Cardiovascular Disease

## 2015-05-29 NOTE — Telephone Encounter (Signed)
Pt cb [phone], please advise

## 2015-05-29 NOTE — Telephone Encounter (Signed)
Pt double booked on 05/30/15 at 1:30 to discuss PET CT results.  Pt's PET had to be moved to 11/4 d/t machine being down at [facility].   Spoke with pt, moved appt to Monday with TP to review PET.  Nothing further needed.

## 2015-05-30 ENCOUNTER — Ambulatory Visit: Payer: PPO | Admitting: Emergency Medicine

## 2015-05-30 ENCOUNTER — Ambulatory Visit: Admission: RE | Admit: 2015-05-30 | Payer: PPO | Source: Ambulatory Visit

## 2015-05-31 ENCOUNTER — Telehealth: Payer: Self-pay | Admitting: Emergency Medicine

## 2015-05-31 ENCOUNTER — Ambulatory Visit
Admission: RE | Admit: 2015-05-31 | Discharge: 2015-05-31 | Disposition: A | Discharge status: Home / Self Care | Payer: PPO | Source: Ambulatory Visit | Attending: Emergency Medicine | Admitting: Emergency Medicine

## 2015-05-31 DIAGNOSIS — R911 Solitary pulmonary nodule: Secondary | ICD-10-CM | POA: Diagnosis not present

## 2015-05-31 LAB — GLUCOSE, CAPILLARY: GLUCOSE-CAPILLARY: 85 mg/dL (ref 65–99)

## 2015-05-31 MED ORDER — FLUDEOXYGLUCOSE F - 18 (FDG) INJECTION
12.3200 | Freq: Once | INTRAVENOUS | Status: DC | PRN
  Administered 2015-05-31: 12.32 via INTRAVENOUS
  Filled 2015-05-31: qty 12.32

## 2015-05-31 NOTE — Telephone Encounter (Signed)
[name] needed order for PET scan to be signed by Dr. Lamonte Sakai, Dr. Lamonte Sakai is not here, she says that we can print it and stamp his name on the order and she can use that until he gets back in office to sign his orders.  Order printed, stamped and faxed to [facility] at Fax: [phone]  Closing encounter

## 2015-06-03 ENCOUNTER — Encounter: Payer: Self-pay | Admitting: Adult Health

## 2015-06-03 ENCOUNTER — Ambulatory Visit (INDEPENDENT_AMBULATORY_CARE_PROVIDER_SITE_OTHER): Payer: PPO | Admitting: Adult Health

## 2015-06-03 VITALS — BP 94/60 | HR 65 | Temp 97.6°F | Ht 66.0 in | Wt 128.0 lb

## 2015-06-03 DIAGNOSIS — J449 Chronic obstructive pulmonary disease, unspecified: Secondary | ICD-10-CM | POA: Diagnosis not present

## 2015-06-03 DIAGNOSIS — R911 Solitary pulmonary nodule: Secondary | ICD-10-CM | POA: Diagnosis not present

## 2015-06-03 NOTE — Patient Instructions (Signed)
Continue on current regimen .  I will be in touch regarding next CT chest  follow up Dr. Lamonte Sakai  In 3 months and As needed

## 2015-06-03 NOTE — Progress Notes (Signed)
Subjective:    Patient ID: [name], female    DOB: 17-Dec-1940, 74 y.o.   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 She is doing well with no flare in cough or wheezing .   Pt has LUL opacity followed by serial CT scan.  CT done last month showed  increased RLL nodule ( 1.4 x 0.7cm -up from last yeat at 0.8 x 0.4cm. )  Increased LLL nodule 1.4 x 0.3cm.   new 1.2cm x 0.7cm nodule in LUL   PET done on 11/4 showed >  No hypermetabolic activity  in LUL  RLL nodule low grade activity, containing subpleural fat. ?post inflammatory .  LLL nodule low grade activity  We discussed these results in detail .  Denies chest pain , orthopnea, edema or fever.      Past Medical History  Diagnosis Date  . HTN (hypertension)   . Arthritis   . Macular degeneration   . Myocardial infarction [facility]) 03/2009    Acute myocardial infarction 2010 - treated with BMS of LCx. LVEF 50% with subsequent CHF  . Lung cancer ([place]) dx'd 07/2006    Lung CA, s/p resection, followed by Dr Earlie Server  . History of CVA (cerebrovascular accident) without residual deficits 01/2012, 06/2012    R hemorrhagic MCA 01/2012 with remote lacunar infarct L putamen and IC, rpt 06/2012 acute multifocal R MCA infarct with remote hemorrhagic strokes affecting L basal ganglia and periventricular white matter, full recovery  . Emphysema/COPD ([place])   . GERD (gastroesophageal reflux disease)   . CAD (coronary artery disease) 03/2009    s/p MI  . HLD (hyperlipidemia)   . Ex-smoker quit 2008  . Chronic combined systolic and diastolic heart failure ([place]) 06/17/2009    Qualifier: Diagnosis of  By: Burt Knack, MD, [place] Pert   . Ischemic cardiomyopathy 02/27/2014  . Helicobacter pylori gastritis  10/2007    treated  . Osteoporosis 09/2013    T -3.6 forearm 09/2013, T -4.5 forearm 04/2015  . CKD (chronic kidney disease) stage 3, GFR 30-59 ml/min       Review of Systems Constitutional:   No  weight loss, night sweats,  Fevers, chills,  +fatigue, or  lassitude.  HEENT:   No headaches,  Difficulty swallowing,  Tooth/dental problems, or  Sore throat,                No sneezing, itching, ear ache, nasal congestion, post nasal drip,   CV:  No chest pain,  Orthopnea, PND, swelling in lower extremities, anasarca, dizziness, palpitations, syncope.   GI  No heartburn, indigestion, abdominal pain, nausea, vomiting, diarrhea, change in bowel habits, loss of appetite, bloody stools.   Resp:    No excess mucus, no productive cough,  No non-productive cough,  No coughing up of blood.  No change in color of mucus.  No wheezing.  No chest wall deformity  Skin: no rash or lesions.  GU: no dysuria, change in color of urine, no urgency or frequency.  No flank pain, no hematuria   MS:  No joint pain or swelling.  No decreased range of motion.  No back pain.  Psych:  No change in mood or affect.  No depression or anxiety.  No memory loss.         Objective:   Physical Exam GEN: A/Ox3; pleasant , NAD, elderly   HEENT:  [place]/AT,  EACs-clear, TMs-wnl, NOSE-clear, THROAT-clear, no lesions, no postnasal drip or exudate noted.   NECK:  Supple w/ fair ROM; no JVD; normal carotid impulses w/o bruits; no thyromegaly or nodules palpated; no lymphadenopathy.  RESP  Decreased BS in bases    .no accessory muscle use, no dullness to percussion  CARD:  RRR, no m/r/g  , no peripheral edema, pulses intact, no cyanosis or clubbing.  GI:   Soft & nt; nml bowel sounds; no organomegaly or masses detected.  Musco: Warm bil, no deformities or joint swelling noted.   Neuro: alert, no focal deficits noted.    Skin: Warm, no lesions or rashes   CT chest 05/22/15 >RLL 1.4cm nodule -new LUL 1.2 cm  nodule-new , no change in 1.6 cm LUL nodule   PET scan 05/31/15 >show lower lobe nodular opacities low grade metabolic act ?post inflammatory etiology.  Left apical nodule neg for hypermetabolic act (no sign change compared to PET in 2014)  No adenopathy    Assessment & Plan:

## 2015-06-05 ENCOUNTER — Telehealth: Payer: Self-pay | Admitting: Family Medicine

## 2015-06-05 NOTE — Telephone Encounter (Signed)
Patient received Kim's letter about her bone density.  Patient is asking for Maudie Mercury to call her.

## 2015-06-05 NOTE — Telephone Encounter (Signed)
Spoke with patient and follow up scheduled to discuss options with Dr. Darnell Level

## 2015-06-07 ENCOUNTER — Encounter: Payer: Self-pay | Admitting: Family Medicine

## 2015-06-07 ENCOUNTER — Ambulatory Visit (INDEPENDENT_AMBULATORY_CARE_PROVIDER_SITE_OTHER): Payer: PPO | Admitting: Family Medicine

## 2015-06-07 VITALS — BP 102/58 | HR 68 | Temp 97.9°F | Wt 127.0 lb

## 2015-06-07 DIAGNOSIS — M810 Age-related osteoporosis without current pathological fracture: Secondary | ICD-10-CM

## 2015-06-07 NOTE — Assessment & Plan Note (Signed)
Reviewed options with patient. Hesitant for meds in general. Reassured prolia shouldn't affect vision. Discussed prolia vs evista. Pt requests more informational material to review and will let me know. Given lung cancer hx, I will check with Dr Earlie Server first if pt desires trial of Evista. She will let me know which she prefers to try. In interim, discussed calcium/vit D and weight bearing exercises. Pt agrees with plan.

## 2015-06-07 NOTE — Patient Instructions (Addendum)
Look at information on prolia (denosumab) and evista (raloxifene) below and let me know which you prefer.  Keep taking calcium/vitamin D. Continue weight bearing exercises (walking).   Denosumab injection What is this medicine? DENOSUMAB (den oh sue mab) slows bone breakdown. Prolia is used to treat osteoporosis in women after menopause and in men. Delton See is used to prevent bone fractures and other bone problems caused by cancer bone metastases. Delton See is also used to treat giant cell tumor of the bone. This medicine may be used for other purposes; ask your health care provider or pharmacist if you have questions. What should I tell my health care provider before I take this medicine? They need to know if you have any of these conditions: -dental disease -eczema -infection or history of infections -kidney disease or on dialysis -low blood calcium or vitamin D -malabsorption syndrome -scheduled to have surgery or tooth extraction -taking medicine that contains denosumab -thyroid or parathyroid disease -an unusual reaction to denosumab, other medicines, foods, dyes, or preservatives -pregnant or trying to get pregnant -breast-feeding How should I use this medicine? This medicine is for injection under the skin. It is given by a health care professional in a hospital or clinic setting. If you are getting Prolia, a special MedGuide will be given to you by the pharmacist with each prescription and refill. Be sure to read this information carefully each time. For Prolia, talk to your pediatrician regarding the use of this medicine in children. Special care may be needed. For Delton See, talk to your pediatrician regarding the use of this medicine in children. While this drug may be prescribed for children as young as 13 years for selected conditions, precautions do apply. Overdosage: If you think you have taken too much of this medicine contact a poison control center or emergency room at once. NOTE:  This medicine is only for you. Do not share this medicine with others. What if I miss a dose? It is important not to miss your dose. Call your doctor or health care professional if you are unable to keep an appointment. What may interact with this medicine? Do not take this medicine with any of the following medications: -other medicines containing denosumab This medicine may also interact with the following medications: -medicines that suppress the immune system -medicines that treat cancer -steroid medicines like prednisone or cortisone This list may not describe all possible interactions. Give your health care provider a list of all the medicines, herbs, non-prescription drugs, or dietary supplements you use. Also tell them if you smoke, drink alcohol, or use illegal drugs. Some items may interact with your medicine. What should I watch for while using this medicine? Visit your doctor or health care professional for regular checks on your progress. Your doctor or health care professional may order blood tests and other tests to see how you are doing. Call your doctor or health care professional if you get a cold or other infection while receiving this medicine. Do not treat yourself. This medicine may decrease your body's ability to fight infection. You should make sure you get enough calcium and vitamin D while you are taking this medicine, unless your doctor tells you not to. Discuss the foods you eat and the vitamins you take with your health care professional. See your dentist regularly. Brush and floss your teeth as directed. Before you have any dental work done, tell your dentist you are receiving this medicine. Do not become pregnant while taking this medicine or for 5  months after stopping it. Women should inform their doctor if they wish to become pregnant or think they might be pregnant. There is a potential for serious side effects to an unborn child. Talk to your health care  professional or pharmacist for more information. What side effects may I notice from receiving this medicine? Side effects that you should report to your doctor or health care professional as soon as possible: -allergic reactions like skin rash, itching or hives, swelling of the face, lips, or tongue -breathing problems -chest pain -fast, irregular heartbeat -feeling faint or lightheaded, falls -fever, chills, or any other sign of infection -muscle spasms, tightening, or twitches -numbness or tingling -skin blisters or bumps, or is dry, peels, or red -slow healing or unexplained pain in the mouth or jaw -unusual bleeding or bruising Side effects that usually do not require medical attention (Report these to your doctor or health care professional if they continue or are bothersome.): -muscle pain -stomach upset, gas This list may not describe all possible side effects. Call your doctor for medical advice about side effects. You may report side effects to FDA at 1-800-FDA-1088. Where should I keep my medicine? This medicine is only given in a clinic, doctor's office, or other health care setting and will not be stored at home. NOTE: This sheet is a summary. It may not cover all possible information. If you have questions about this medicine, talk to your doctor, pharmacist, or health care provider.    2016, Elsevier/Gold Standard. (2012-01-11 12:37:47)  Raloxifene tablets What is this medicine? RALOXIFENE (ral OX i feen) reduces the amount of calcium lost from bones. It is used to treat and prevent osteoporosis in women who have experienced menopause. This medicine may be used for other purposes; ask your health care provider or pharmacist if you have questions. What should I tell my health care provider before I take this medicine? They need to know if you have any of these conditions: -a history of blood clots -cancer -heart failure -liver disease -premenopausal -an unusual or  allergic reaction to raloxifene, other medicines, foods, dyes, or preservatives -pregnant or trying to get pregnant -breast-feeding How should I use this medicine? Take this medicine by mouth with a glass of water. Follow the directions on the prescription label. The tablets can be taken with or without food. Take your doses at regular intervals. Do not take your medicine more often than directed. Talk to your pediatrician regarding the use of this medicine in children. Special care may be needed. Overdosage: If you think you have taken too much of this medicine contact a poison control center or emergency room at once. NOTE: This medicine is only for you. Do not share this medicine with others. What if I miss a dose? If you miss a dose, take it as soon as you can. If it is almost time for your next dose, take only that dose. Do not take double or extra doses. What may interact with this medicine? -ampicillin -cholestyramine -colestipol -diazepam -diazoxide -female hormones like hormone replacement therapy -lidocaine -warfarin This list may not describe all possible interactions. Give your health care provider a list of all the medicines, herbs, non-prescription drugs, or dietary supplements you use. Also tell them if you smoke, drink alcohol, or use illegal drugs. Some items may interact with your medicine. What should I watch for while using this medicine? Visit your doctor or health care professional for regular checks on your progress. Do not stop taking this medicine  except on the advice of your doctor or health care professional. Dennis Bast should make sure you get enough calcium and vitamin D in your diet while you are taking this medicine. Discuss your dietary needs with your health care professional or nutritionist. Exercise may help to prevent bone loss. Discuss your exercise needs with your doctor or health care professional. This medicine can rarely cause blood clots. You should avoid  long periods of bed rest while taking this medicine. If you are going to have surgery, tell your doctor or health care professional that you are taking this medicine. This medicine should be stopped at least 3 days before surgery. After surgery, it should be restarted only after you are walking again. It should not be restarted while you still need long periods of bed rest. You should not smoke while taking this medicine. Smoking may also increase your risk of blood clots. Smoking can also decrease the effects of this medicine. This medicine does not prevent hot flashes. It may cause hot flashes in some patients at the start of therapy. What side effects may I notice from receiving this medicine? Side effects that you should report to your doctor or health care professional as soon as possible: -change in vision -chest pain -difficulty breathing -leg pain or swelling -skin rash, itching Side effects that usually do not require medical attention (report to your doctor or health care professional if they continue or are bothersome): -fluid build-up -leg cramps -stomach pain -sweating This list may not describe all possible side effects. Call your doctor for medical advice about side effects. You may report side effects to FDA at 1-800-FDA-1088. Where should I keep my medicine? Keep out of the reach of children. Store at room temperature between 15 and 30 degrees C (59 and 86 degrees F). Throw away any unused medicine after the expiration date. NOTE: This sheet is a summary. It may not cover all possible information. If you have questions about this medicine, talk to your doctor, pharmacist, or health care provider.    2016, Elsevier/Gold Standard. (2008-06-28 15:15:14)

## 2015-06-07 NOTE — Progress Notes (Signed)
BP 102/58 mmHg  Pulse 68  Temp(Src) 97.9 F (36.6 C) (Oral)  Wt 127 lb (57.607 kg)  SpO2 94%   CC: discuss DEXA  Subjective:    Patient ID: [name], female    DOB: May 23, 1941, 74 y.o.   MRN: [id number]  HPI: [name] is a 74 y.o. female presenting on 06/07/2015 for Follow-up   DEXA 04/2015 showing T -4.5 at forearm - severe osteoporosis. Progressed from prior -3.6 forearm (09/2013).   Actonel caused bone pain. Pt does not want injections. Hesitant for meds in general. Worried prolia may affect her eyes.  Compliant with calcium/vit D BID and vit D 2000 IU daily.  Relevant past medical, surgical, family and social history reviewed and updated as indicated. Interim medical history since our last visit reviewed. Allergies and medications reviewed and updated. Current Outpatient Prescriptions on File Prior to Visit  Medication Sig  . albuterol (PROAIR HFA) 108 (90 BASE) MCG/ACT inhaler Inhale 2 puffs into the lungs every 4 (four) hours as needed for wheezing or shortness of breath.  Marland Kitchen aspirin 325 MG tablet Take 1 tablet (325 mg total) by mouth daily.  . Calcium Carbonate-Vitamin D (CALCIUM-VITAMIN D) 500-200 MG-UNIT per tablet Take 2 tablets by mouth daily.  . carvedilol (COREG) 3.125 MG tablet TAKE 1 TABLET BY MOUTH TWICE A DAY WITH FOOD  . Cholecalciferol (VITAMIN D) 2000 UNITS CAPS Take 1 capsule by mouth daily.  . furosemide (LASIX) 40 MG tablet TAKE 1 TABLET BY MOUTH DAILY  . isosorbide mononitrate (IMDUR) 30 MG 24 hr tablet Take 1 tablet (30 mg total) by mouth daily.  Marland Kitchen losartan (COZAAR) 50 MG tablet TAKE 1 TABLET BY MOUTH DAILY  . LOTEMAX 0.5 % ophthalmic suspension Place 1 drop into both eyes daily.   . meclizine (ANTIVERT) 25 MG tablet Take 1 tablet (25 mg total) by mouth 3 (three) times daily as needed for dizziness.  . nitroGLYCERIN (NITROSTAT) 0.4 MG SL tablet Place 1 tablet (0.4 mg total) under the tongue every 5 (five) minutes as needed for chest pain.  Marland Kitchen  omeprazole (PRILOSEC) 20 MG capsule TAKE ONE CAPSULE BY MOUTH DAILY  . simvastatin (ZOCOR) 40 MG tablet TAKE ONE TABLET BY MOUTH AT BEDTIME  . spironolactone (ALDACTONE) 25 MG tablet TAKE 1/2  TABLET BY MOUTH ONCE DAILY.   No current facility-administered medications on file prior to visit.    Review of Systems Per HPI unless specifically indicated in ROS section     Objective:    BP 102/58 mmHg  Pulse 68  Temp(Src) 97.9 F (36.6 C) (Oral)  Wt 127 lb (57.607 kg)  SpO2 94%  Wt Readings from Last 3 Encounters:  06/07/15 127 lb (57.607 kg)  06/03/15 128 lb (58.06 kg)  05/22/15 127 lb (57.607 kg)    Physical Exam  Constitutional: She appears well-developed and well-nourished. No distress.  thin  Psychiatric: She has a normal mood and affect.  Nursing note and vitals reviewed.  Results for orders placed or performed during the hospital encounter of 05/31/15  Glucose, capillary  Result Value Ref Range   Glucose-Capillary 85 65 - 99 mg/dL      Assessment & Plan:   Problem List Items Addressed This Visit    Osteoporosis - Primary    Reviewed options with patient. Hesitant for meds in general. Reassured prolia shouldn't affect vision. Discussed prolia vs evista. Pt requests more informational material to review and will let me know. Given lung cancer hx, I will  check with Dr Earlie Server first if pt desires trial of Evista. She will let me know which she prefers to try. In interim, discussed calcium/vit D and weight bearing exercises. Pt agrees with plan.           Follow up plan: No Follow-up on file.

## 2015-06-09 NOTE — Assessment & Plan Note (Signed)
Continue on current regimen .   follow up Dr. Lamonte Sakai  In 3 months and As needed

## 2015-06-09 NOTE — Assessment & Plan Note (Addendum)
Continue on current regimen   I will be in touch regarding next CT chest  follow up Dr. Lamonte Sakai  In 3 months and As needed    Case and scans reviewed with Dr. Lamonte Sakai will repeat CT chest in 6 months .

## 2015-06-11 ENCOUNTER — Telehealth: Payer: Self-pay | Admitting: Cardiovascular Disease

## 2015-06-11 NOTE — Telephone Encounter (Signed)
I spoke with the pt and she had a bone density test performed which showed osteoporosis.  Dr Danise Mina is managing the pt and he has recommended Prolia.  The pt would like to know if Dr Burt Knack is okay with her taking this medication. I will forward this information to Dr Burt Knack.

## 2015-06-11 NOTE — Telephone Encounter (Signed)
New Message    Pt calling wanting to speak to a nurse about what medication to take. She states she has a choice between two medications and she wants to know which would be best for her. Please call back and advise.

## 2015-06-11 NOTE — Telephone Encounter (Signed)
Reviewed drug information. I do not see any cardiac related contraindications for this patient. From my perspective, it is okay for her to take this.

## 2015-06-12 ENCOUNTER — Other Ambulatory Visit: Payer: Self-pay | Admitting: Cardiovascular Disease

## 2015-06-12 NOTE — Telephone Encounter (Signed)
Spoke with pt and informed her informed provided by Dr. Burt Knack. Pt verbalized understanding and was appreciative for call back.

## 2015-06-13 ENCOUNTER — Telehealth: Payer: Self-pay

## 2015-06-13 MED ORDER — DENOSUMAB 60 MG/ML ~~LOC~~ SOLN: 60.0000 mg | SUBCUTANEOUS | Status: AC

## 2015-06-13 NOTE — Telephone Encounter (Signed)
Pt was seen 06/07/15 and pt has decided she wants to try prolia. Pt request cb when med has been approved,ordered and in house for nurse visit.

## 2015-06-13 NOTE — Telephone Encounter (Signed)
Ordered. plz start PA.

## 2015-06-14 NOTE — Telephone Encounter (Signed)
Filled and in Kim's box. 

## 2015-06-14 NOTE — Telephone Encounter (Signed)
Form in your IN box for review/completion

## 2015-06-17 NOTE — Telephone Encounter (Signed)
Order faxed.

## 2015-06-26 NOTE — Telephone Encounter (Signed)
Patient called and asked for [name] to call her back about Prolia.  Please call patient at [phone].

## 2015-06-27 NOTE — Telephone Encounter (Signed)
Just received determination from Prolia today. Patient has ~$166 cost to cover Prolia. Spoke with patient and gave her patient assistance # per Rose. She will let me know what they advise. Paperwork in your IN box for review. Please return it to me. Thanks!

## 2015-07-01 NOTE — Telephone Encounter (Signed)
Noted. Papers in [name]'s box.

## 2015-07-03 ENCOUNTER — Other Ambulatory Visit: Payer: Self-pay | Admitting: *Deleted

## 2015-07-03 DIAGNOSIS — I2510 Atherosclerotic heart disease of native coronary artery without angina pectoris: Secondary | ICD-10-CM

## 2015-07-03 MED ORDER — NITROGLYCERIN 0.4 MG SL SUBL: 0.4000 mg | SUBLINGUAL_TABLET | SUBLINGUAL | Status: DC | PRN

## 2015-08-09 NOTE — Telephone Encounter (Signed)
Pt has called prolia co and has a number for Kim to contact [phone]. Pt request cb .

## 2015-08-13 NOTE — Telephone Encounter (Signed)
Message left for patient to return my call and let me know what I needed to do in regards to this phone call to Prolia.

## 2015-08-13 NOTE — Telephone Encounter (Signed)
Spoke with patient. The # she wanted me to call was for a PA through her insurance. I advised that the PA had already been done and that she would be responsible for $166. I also advised that the # I provided her with in December was the Prolia patient assistance line to see if she qualified for financial assistance. She said she would call that and check because she didn't before. She just checked with her insurance. She will let me know.

## 2015-08-16 ENCOUNTER — Telehealth: Payer: Self-pay | Admitting: Family Medicine

## 2015-08-16 NOTE — Telephone Encounter (Signed)
Spoke with patient.

## 2015-08-16 NOTE — Telephone Encounter (Signed)
Patient assistance has been approved. Paperwork in your IN box for review. Message left for patient to return my call.

## 2015-08-16 NOTE — Telephone Encounter (Signed)
Noted. Order has already been faxed.

## 2015-08-16 NOTE — Telephone Encounter (Signed)
Spoke with patient. Order had been placed on hold until patient assistance was approved. Rose, could you go ahead and process this now? Thanks!

## 2015-08-16 NOTE — Telephone Encounter (Signed)
Pt returned your called

## 2015-08-26 ENCOUNTER — Other Ambulatory Visit: Payer: Self-pay | Admitting: Adult Health

## 2015-08-26 ENCOUNTER — Telehealth: Payer: Self-pay | Admitting: Adult Health

## 2015-08-26 DIAGNOSIS — R918 Other nonspecific abnormal finding of lung field: Secondary | ICD-10-CM

## 2015-08-26 DIAGNOSIS — C3431 Malignant neoplasm of lower lobe, right bronchus or lung: Secondary | ICD-10-CM

## 2015-08-26 NOTE — Telephone Encounter (Signed)
I called [facility] at [facility] and spoke with Rexene Edison, NP [phone]).  I called to let her know that I had ordered the CT chest without contrast, based on the recommendations from the patient's PET scan in 05/2015 and wanted to ask if they would prefer that I follow the patient for the pulmonary nodules/cancer surveillance standpoint or if they would continue to do that.  Tammy let me know that Ms. Kuc will see Dr. Lamonte Sakai next week to discuss her plan and she will relay to him that Pulmonology will continue to follow these nodules with appropriate imaging as they deem medically appropriate.    I will plan to see the patient for her annual cancer survivorship surveillance visit in 04/2016.  I encouraged Tammy to call me anytime if there were questions re: this patient's oncologic history or future surveillance/survivorship care.  I look forward to participating in her care.   Mike Craze, NP [place] [phone]

## 2015-08-26 NOTE — Telephone Encounter (Signed)
Dr. Lamonte Sakai , pt will be seeing you 09/05/15 for follow up  . We are following lung nodules . She has upcoming CT chest in May that was ordered by Oncology for her follow up for nodules . Oncology called and We will need to follow up for that CT going forward. They will be seeing back for yearly only visit.  Thanks [employer]

## 2015-08-27 NOTE — Telephone Encounter (Signed)
Pt will bring card next week that pt received to see if there is anything else that needs to be done. Sent to [facility] as FYI.

## 2015-09-02 ENCOUNTER — Telehealth: Payer: Self-pay | Admitting: Adult Health

## 2015-09-02 ENCOUNTER — Telehealth: Payer: Self-pay | Admitting: Family Medicine

## 2015-09-02 NOTE — Telephone Encounter (Signed)
Great! Thanks! I'll come get it and put her name on it!

## 2015-09-02 NOTE — Telephone Encounter (Signed)
Kim I have a  Extra prolia in the fridge on this side

## 2015-09-02 NOTE — Telephone Encounter (Signed)
Pt came in to discuss concerns with prolia. Discussed. Pt read article about prolia- she will bring in for me to review.

## 2015-09-02 NOTE — Telephone Encounter (Signed)
Pt has prolia appt on Friday,. Just letting you know for ordering purposes

## 2015-09-02 NOTE — Telephone Encounter (Signed)
I called to speak with [name] re: the need to schedule her annual survivorship appt, as requested by Dr. Marlow Baars, NP.  I briefly let her know about our survivorship program and my future role in her care.  She will continue to follow-up with Dr. Rise Paganini, as appropriate.  She will have a CT chest in 11/2015 to evaluate lung nodules.   I will see her in 04/2016 for annual surveillance oncology visit in survivorship.  I will coordinate with Dr. Agustina Caroli team if the patient needs repeat or additional imaging before that visit.  I have mailed her a copy of her appt calendar, along with my business card, and encouraged her to call me if she has any questions or concerns before her next appt at the cancer center.  I look forward to participating in her care!   Mike Craze, NP [place] [phone]

## 2015-09-03 DIAGNOSIS — H26493 Other secondary cataract, bilateral: Secondary | ICD-10-CM | POA: Diagnosis not present

## 2015-09-03 DIAGNOSIS — H1851 Endothelial corneal dystrophy: Secondary | ICD-10-CM | POA: Diagnosis not present

## 2015-09-05 ENCOUNTER — Encounter: Payer: Self-pay | Admitting: Emergency Medicine

## 2015-09-05 ENCOUNTER — Ambulatory Visit (INDEPENDENT_AMBULATORY_CARE_PROVIDER_SITE_OTHER): Payer: PPO | Admitting: Emergency Medicine

## 2015-09-05 VITALS — BP 90/62 | HR 70 | Wt 122.0 lb

## 2015-09-05 DIAGNOSIS — R911 Solitary pulmonary nodule: Secondary | ICD-10-CM

## 2015-09-05 DIAGNOSIS — J449 Chronic obstructive pulmonary disease, unspecified: Secondary | ICD-10-CM

## 2015-09-05 NOTE — Progress Notes (Signed)
Subjective:    Patient ID: [name], female    DOB: 1941-07-18, 75 y.o.   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 She has been having dyspnea for > 1 year, some cough that is productive of white mucous. She has hoarse voice frequently, not necessarily related to cough. Not clear that it relates in time to symbicort.   Part of her eval has included CXR that showed a 1.4x0.6cm  L apical nodule, confirmed by CT scan 09/28/12. PET on 10/07/12 showed the nodule was cold.   She is on Symbicort qam only for over a year, feels that it helps her.   PFT 09/02/12 >> moderately severe AFL without BD response, normal volumes, decreased diffusion.   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 PET negative 1.4x0.6cm  L apical nodule 10/07/12. We stopped symbicort, started spiriva last time.  She doesn't think the SOB has changed any since starting. She is still having some mucous but better. Still has cough, esp in the am. She doesn't have a SABA. She is due for CT scan chest w Dr Julien Nordmann in 9/'14. Not on nasal steroid or anti-histamine.   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 PET negative 1.4x0.6cm  L apical nodule 10/07/12. Stable by CT scan 04/10/13.  Had been doing well until this week when she got sore throat, dry cough. Using spiriva, albuterol prn > about once a day.  She has flonase at home but hasn't used it yet.   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 We are following PET negative L apical nodule, repeat Ct scan due in September. She stopped her spiriva due to some associated SOB, ? Due to UA  effects. She tells me that she had a possible rxn to fosamax, caused dyspnea and ? Throat swelling. She feels that her breathing is better. She uses 1 puff her rescue SABA about once a day. Doesn't feel that she misses the spiriva.   ROV 12/04/14 -- follow-up visit for COPD and an abnormal CT scan with a left apical nodule. She also has a history of adenocarcinoma status post a right lower lobe superior segmentectomy in 2008.  Her most recent CT scan of the chest was performed in October 2015 and was stable without any new findings. The left apical nodule was unchanged.  She has been having more breathing trouble since the weather has gotten warmer.  She has heard some wheeze, some allergy sx.  She states that she had associated CP with exertion. She could not tolerate fluticasone nasal spray - said it made her breathing worse. She is confused / confusing regarding which inhaled meds she has been on, when, and how she has responded to them. I believe she has tried albuterol for these sx without good response.   ROV 01/11/15 -- follow-up visit for history of COPD, adenocarcinoma of the lung status post right lower lobe superior segmentectomy, chest CT with a stable left apical nodule. She is due for another CT chest in October 2016. Last month I asked her to start Stiolto to see if she would benefit. She started  the med, developed diarrhea that has been happening since the [employer]. She decreased to qod, still has the diarrhea on the days when she takes it. She started flonase but it was ineffective. g  Follow up 02/12/15 -  : [place] NP / COPD-GOLD II  , Adenocarcinoma of lung s/p RLL superior segmentectomy, left apical nodules .  [name] .  Started on ANORO last month but has been unable to tolerate due to watery eyes, blurred vision  Has ov with eye doctor this week.  Previously on Stiolto but stopped due to diarrhea.  Does not want to be on any inhalers . Feels they make her worse.  Feels  her breathing is at baseline. Gets winded with activity. No dyspnea at rest.  No chest pain , orthopnea, edema or fever.   Last CT chest 04/2014 no acute finding, stable nodule . Upcoming CT in 04/2105   05/14/15 -- follow-up visit for gold B COPD, adenocarcinoma of the lung status post right lower lobe superior segmentectomy, left upper lobe pulmonary nodules that have been followed on serial CT scans of the chest. She's been tried on both Anoro and Stiolto but these were discontinued due to side effects.  She feels that her breathing is doing well. She rarely needs albuterol - does use it sometimes for cough, exertional SOB. She has daily cough, productive of clear. She has had a URI recently, now improving. She had vertigo - prompted a w/u for recurrence of her CVA. She is due for a repeat Ct scan of her chest   ROV 09/05/15 -- patient with a history of COPD, Gold stage B, as well as pulmonary nodules that have been followed on serial CT scans. Based on some change in size of right lower lobe and left lower lobe nodules PET scan was performed on 05/31/15. This scan showed low-grade activity in both nodules and no hypermetabolic activity in the left upper lobe. Recommendation was made to repeat a CT scan of the chest in April 2017.  She is currently doing very well - she is using SABA very rarely, is not on maintenance BD's.  She has not lost weight, no cough.   Review of Systems     As per history of present illness    Objective:   Physical Exam Filed Vitals:   09/05/15 1326  BP: 90/62  Pulse: 70  Weight: 122 lb (55.339 kg)  SpO2: 94%    Gen: Pleasant, elderly in no distress,  normal affect  ENT: No lesions,  mouth clear,  oropharynx clear, no postnasal drip, hoarse voice  Neck: No JVD, no TMG, no carotid bruits  Lungs: No use of accessory muscles, very distant, clear without rales or rhonchi  Cardiovascular: RRR, heart sounds normal, no murmur or gallops, no peripheral  edema  Musculoskeletal: No deformities, no cyanosis or clubbing  Neuro: alert, non focal  Skin: Warm, no lesions or rashes     04/2014 --  COMPARISON: 04/11/2013  FINDINGS: Mediastinum: The heart size is normal. There is no pericardial effusion. Calcified atherosclerotic plaque involves the thoracic aorta as well as the LAD, left circumflex and RCA coronary arteries. 8 mm right paratracheal lymph node is unchanged. No enlarged mediastinal or hilar lymph nodes.  Lungs/Pleura: No pleural effusion. Moderate changes of centrilobular emphysema. Postsurgical changes identified within the right lower lobe centrally. Left apical nodule density measures 1.5 cm, image 5/ series 5. Unchanged from previous exam. Stable nodular opacity within the superior segment  of left lower lobe measuring 1.1 cm, image number 27/series 5. Right upper lobe nodule measured 5 mm, image 19/series 5. No new or enlarging pulmonary nodules or masses.  Upper abdomen: The visualized portions of the liver and gallbladder are unremarkable. The visualized portions of the spleen and adrenal glands are also normal.  Musculoskeletal: There is a scoliosis deformity involving the thoracic spine. No aggressive lytic or sclerotic bone lesions identified.  IMPRESSION: 1. No acute findings and no significant change when compared with 04/11/2013. 2. Previously referenced left apical nodular density is unchanged from previous exam.     Assessment & Plan:  COPD (chronic obstructive pulmonary disease) Minimal symptoms at this time. She is not requiring scheduled bronchodilator. She uses albuterol very rarely. I do not believe need to change her medications for now  Pulmonary nodule, left Bilateral pulmonary nodules that are equivocal on PET scan but which have enlarged on serial CT scans. She is at high risk for primary lung malignancy given her history of adenocarcinoma in the past. We will repeat her CT scan of  the chest in April 2017 and then follow-up to review the results.

## 2015-09-05 NOTE — Assessment & Plan Note (Signed)
Minimal symptoms at this time. She is not requiring scheduled bronchodilator. She uses albuterol very rarely. I do not believe need to change her medications for now

## 2015-09-05 NOTE — Assessment & Plan Note (Signed)
Bilateral pulmonary nodules that are equivocal on PET scan but which have enlarged on serial CT scans. She is at high risk for primary lung malignancy given her history of adenocarcinoma in the past. We will repeat her CT scan of the chest in April 2017 and then follow-up to review the results.

## 2015-09-05 NOTE — Patient Instructions (Signed)
Please continue to use your Pro-Air as needed for shortness of breath/  We will not start any new inhalers at this time.  We will repeat your CT chest in April 2017.  Follow with Dr Lamonte Sakai in April after the CT to discss the results.

## 2015-09-06 ENCOUNTER — Ambulatory Visit: Payer: PPO

## 2015-09-06 ENCOUNTER — Other Ambulatory Visit (INDEPENDENT_AMBULATORY_CARE_PROVIDER_SITE_OTHER): Payer: PPO

## 2015-09-06 ENCOUNTER — Telehealth: Payer: Self-pay | Admitting: Family Medicine

## 2015-09-06 DIAGNOSIS — M810 Age-related osteoporosis without current pathological fracture: Secondary | ICD-10-CM

## 2015-09-06 LAB — CALCIUM: Calcium: 9.6 mg/dL (ref 8.4–10.5)

## 2015-09-06 NOTE — Telephone Encounter (Signed)
Form on your desk  

## 2015-09-06 NOTE — Telephone Encounter (Signed)
Pt is scheduled for Prolia inj today. I called and left message for pt to return call to office. She needs to have had a calcium level, and for it to be wnl, within 30 days prior to receiving prolia injection. She will need to have lab first, and reschedule nurse visit.

## 2015-09-06 NOTE — Telephone Encounter (Signed)
Pt dropped off article that she is asking you to read. Placing on kim's desk, thank you

## 2015-09-06 NOTE — Telephone Encounter (Signed)
I spoke with pt, she is coming in today for labs and will reschedule Prolia inj.

## 2015-09-07 NOTE — Telephone Encounter (Signed)
Reviewed. Should be ok to proceed with injection plz monitor pt 15 min after injection Ensure no upcoming dental work planned.

## 2015-09-09 ENCOUNTER — Other Ambulatory Visit (INDEPENDENT_AMBULATORY_CARE_PROVIDER_SITE_OTHER): Payer: PPO

## 2015-09-09 DIAGNOSIS — E559 Vitamin D deficiency, unspecified: Secondary | ICD-10-CM | POA: Diagnosis not present

## 2015-09-09 LAB — VITAMIN D 25 HYDROXY (VIT D DEFICIENCY, FRACTURES): VITD: 86.08 ng/mL (ref 30.00–100.00)

## 2015-09-09 NOTE — Telephone Encounter (Signed)
Patient notified and appt scheduled. No dental work scheduled.

## 2015-09-11 ENCOUNTER — Ambulatory Visit (INDEPENDENT_AMBULATORY_CARE_PROVIDER_SITE_OTHER): Payer: PPO | Admitting: *Deleted

## 2015-09-11 DIAGNOSIS — M810 Age-related osteoporosis without current pathological fracture: Secondary | ICD-10-CM | POA: Diagnosis not present

## 2015-09-11 MED ORDER — DENOSUMAB 60 MG/ML ~~LOC~~ SOLN
60.0000 mg | Freq: Once | SUBCUTANEOUS | Status: AC
  Administered 2015-09-11: 60 mg via SUBCUTANEOUS

## 2015-10-07 ENCOUNTER — Encounter: Payer: Self-pay | Admitting: Family Medicine

## 2015-10-07 ENCOUNTER — Ambulatory Visit (INDEPENDENT_AMBULATORY_CARE_PROVIDER_SITE_OTHER): Payer: PPO | Admitting: Family Medicine

## 2015-10-07 VITALS — BP 100/60 | HR 60 | Temp 97.6°F | Wt 124.8 lb

## 2015-10-07 DIAGNOSIS — M810 Age-related osteoporosis without current pathological fracture: Secondary | ICD-10-CM | POA: Diagnosis not present

## 2015-10-07 DIAGNOSIS — R636 Underweight: Secondary | ICD-10-CM | POA: Insufficient documentation

## 2015-10-07 DIAGNOSIS — I5042 Chronic combined systolic (congestive) and diastolic (congestive) heart failure: Secondary | ICD-10-CM | POA: Diagnosis not present

## 2015-10-07 DIAGNOSIS — K219 Gastro-esophageal reflux disease without esophagitis: Secondary | ICD-10-CM

## 2015-10-07 NOTE — Assessment & Plan Note (Signed)
On daily PPI - otherwise breakthrough symptoms

## 2015-10-07 NOTE — Assessment & Plan Note (Signed)
Appreciate cardiology care of patient.

## 2015-10-07 NOTE — Patient Instructions (Addendum)
I'm glad you're doing well with prolia.  Next shot will be 03/10/2016. Good to see you today, call us with questions.

## 2015-10-07 NOTE — Assessment & Plan Note (Addendum)
She has tolerated prolia. Continue Q6 mo as long as able to receive grant.  Continue calcium/vit D and regular weight bearing exercises

## 2015-10-07 NOTE — Progress Notes (Signed)
BP 100/60 mmHg  Pulse 60  Temp(Src) 97.6 F (36.4 C) (Oral)  Wt 124 lb 12 oz (56.586 kg)   CC: 6 mo f/u visit  Subjective:    Patient ID: [name], female    DOB: [birth year], 75 y.o.   MRN: [id number]  HPI: [name] is a 75 y.o. female presenting on 10/07/2015 for Follow-up   Pleasant 74yo with CAD/MI and ischemic CM, R MCA CVA, HTN, RLL segmentectomy 2008 for lung adenocarcinoma, and COPD in ex smoker. Followed by oncology, pulmonology and cardiology. Heart meds are through cardiology. Quit smoking 2006.   Osteoporosis - first prolia injection was 09/11/2015. She got grant through [employer].  DEXA 04/2015 showing T -4.5 at forearm - severe osteoporosis. Progressed from prior -3.6 forearm (09/2013).  Actonel caused bone pain.  Compliant with calcium/vit D BID + vit D 2000 IU daily.  GERD well controlled on omeprazole '20mg'$  daily. Unable to stop due to breakthrough.   BP Readings from Last 3 Encounters:  10/07/15 100/60  09/05/15 90/62  06/07/15 102/58    Relevant past medical, surgical, family and social history reviewed and updated as indicated. Interim medical history since our last visit reviewed. Allergies and medications reviewed and updated. Current Outpatient Prescriptions on File Prior to Visit  Medication Sig  . albuterol (PROAIR HFA) 108 (90 BASE) MCG/ACT inhaler Inhale 2 puffs into the lungs every 4 (four) hours as needed for wheezing or shortness of breath.  Marland Kitchen aspirin 325 MG tablet Take 1 tablet (325 mg total) by mouth daily.  . Calcium Carbonate-Vitamin D (CALCIUM-VITAMIN D) 500-200 MG-UNIT per tablet Take 2 tablets by mouth daily.  . carvedilol (COREG) 3.125 MG tablet TAKE 1 TABLET BY MOUTH TWICE A DAY WITH FOOD  . Cholecalciferol (VITAMIN D) 2000 UNITS CAPS Take 1 capsule by mouth daily.  Marland Kitchen denosumab (PROLIA) 60 MG/ML SOLN injection Inject 60 mg into the skin every 6 (six) months. Administer in upper arm, thigh, or abdomen  . furosemide (LASIX)  40 MG tablet TAKE 1 TABLET BY MOUTH DAILY  . isosorbide mononitrate (IMDUR) 30 MG 24 hr tablet Take 1 tablet (30 mg total) by mouth daily.  Marland Kitchen losartan (COZAAR) 50 MG tablet TAKE 1 TABLET BY MOUTH DAILY  . LOTEMAX 0.5 % ophthalmic suspension Place 1 drop into both eyes daily.   . meclizine (ANTIVERT) 25 MG tablet Take 1 tablet (25 mg total) by mouth 3 (three) times daily as needed for dizziness.  . nitroGLYCERIN (NITROSTAT) 0.4 MG SL tablet Place 1 tablet (0.4 mg total) under the tongue every 5 (five) minutes as needed for chest pain.  Marland Kitchen omeprazole (PRILOSEC) 20 MG capsule TAKE ONE CAPSULE BY MOUTH DAILY  . simvastatin (ZOCOR) 40 MG tablet TAKE ONE TABLET BY MOUTH AT BEDTIME  . spironolactone (ALDACTONE) 25 MG tablet TAKE 1/2  TABLET BY MOUTH ONCE DAILY.   No current facility-administered medications on file prior to visit.    Review of Systems Per HPI unless specifically indicated in ROS section     Objective:    BP 100/60 mmHg  Pulse 60  Temp(Src) 97.6 F (36.4 C) (Oral)  Wt 124 lb 12 oz (56.586 kg)  Wt Readings from Last 3 Encounters:  10/07/15 124 lb 12 oz (56.586 kg)  09/05/15 122 lb (55.339 kg)  06/07/15 127 lb (57.607 kg)   Body mass index is 20.14 kg/(m^2).  Physical Exam  Constitutional: She appears well-developed and well-nourished. No distress.  HENT:  Mouth/Throat: Oropharynx is  clear and moist. No oropharyngeal exudate.  Eyes: Conjunctivae and EOM are normal. Pupils are equal, round, and reactive to light. No scleral icterus.  L anisocoria  Cardiovascular: Normal rate, regular rhythm, normal heart sounds and intact distal pulses.   No murmur heard. Pulmonary/Chest: Effort normal and breath sounds normal. No respiratory distress. She has no wheezes. She has no rales.  Musculoskeletal: She exhibits no edema.  Nursing note and vitals reviewed.  Results for orders placed or performed in visit on 09/09/15  Vitamin D 25 hydroxy  Result Value Ref Range   VITD 86.08  30.00 - 100.00 ng/mL      Assessment & Plan:   Problem List Items Addressed This Visit    Underweight   Osteoporosis - Primary    She has tolerated prolia. Continue Q6 mo as long as able to receive grant.  Continue calcium/vit D and regular weight bearing exercises      GERD    On daily PPI - otherwise breakthrough symptoms      Chronic combined systolic and diastolic heart failure [facility])    Appreciate cardiology care of patient.           Follow up plan: Return in about 6 months (around 04/08/2016), or as needed, for medicare wellness visit.

## 2015-10-07 NOTE — Progress Notes (Signed)
Pre visit review using our clinic review tool, if applicable. No additional management support is needed unless otherwise documented below in the visit note. 

## 2015-10-16 ENCOUNTER — Telehealth: Payer: Self-pay | Admitting: Cardiovascular Disease

## 2015-10-16 NOTE — Telephone Encounter (Signed)
°  New Prob   Pt is requesting to speak to a nurse regarding "some trouble with my heart". Pt did not wish to give much detail. Please call.

## 2015-10-16 NOTE — Telephone Encounter (Signed)
I spoke with the pt and she complains of left arm numbness last night and her SBP being elevated to 160.  The pt did take 3 NTG while having these symptoms and her BP improved and left arm numbness decreased.  After the 3 NTG the pt belched and all symptoms resolved.  The pt feels better today. The pt is unsure if her symptoms are heart related and I made her aware that we should see her for further evaluation. Appointment scheduled tomorrow with [name] PA. Pt agreed with plan.

## 2015-10-17 ENCOUNTER — Encounter: Payer: Self-pay | Admitting: Physician Assistant

## 2015-10-17 ENCOUNTER — Ambulatory Visit (INDEPENDENT_AMBULATORY_CARE_PROVIDER_SITE_OTHER): Payer: PPO | Admitting: Physician Assistant

## 2015-10-17 VITALS — BP 130/52 | HR 64 | Ht 66.0 in | Wt 121.1 lb

## 2015-10-17 DIAGNOSIS — R208 Other disturbances of skin sensation: Secondary | ICD-10-CM

## 2015-10-17 DIAGNOSIS — I5022 Chronic systolic (congestive) heart failure: Secondary | ICD-10-CM

## 2015-10-17 DIAGNOSIS — Z8673 Personal history of transient ischemic attack (TIA), and cerebral infarction without residual deficits: Secondary | ICD-10-CM

## 2015-10-17 DIAGNOSIS — I25119 Atherosclerotic heart disease of native coronary artery with unspecified angina pectoris: Secondary | ICD-10-CM

## 2015-10-17 DIAGNOSIS — I10 Essential (primary) hypertension: Secondary | ICD-10-CM | POA: Diagnosis not present

## 2015-10-17 DIAGNOSIS — I2510 Atherosclerotic heart disease of native coronary artery without angina pectoris: Secondary | ICD-10-CM | POA: Diagnosis not present

## 2015-10-17 DIAGNOSIS — R200 Anesthesia of skin: Secondary | ICD-10-CM

## 2015-10-17 LAB — BASIC METABOLIC PANEL
BUN: 13 mg/dL (ref 7–25)
CO2: 29 mmol/L (ref 20–31)
Calcium: 8.5 mg/dL — ABNORMAL LOW (ref 8.6–10.4)
Chloride: 102 mmol/L (ref 98–110)
Creat: 0.81 mg/dL (ref 0.60–0.93)
GLUCOSE: 77 mg/dL (ref 65–99)
Potassium: 3.9 mmol/L (ref 3.5–5.3)
SODIUM: 137 mmol/L (ref 135–146)

## 2015-10-17 LAB — TROPONIN I

## 2015-10-17 NOTE — Patient Instructions (Addendum)
Medication Instructions:  Your physician recommends that you continue on your current medications as directed. Please refer to the Current Medication list given to you today.   Labwork: TODAY:  BMET & TROPONIN  Testing/Procedures: NONE ORDERED  Follow-Up: Your physician recommends that you schedule a follow-up appointment in: 1 [place]   Any Other Special Instructions Will Be Listed Below (If Applicable).  You have been referred to [facility] to follow up on left arm numbness.     If you need a refill on your cardiac medications before your next appointment, please call your pharmacy.

## 2015-10-17 NOTE — Progress Notes (Signed)
Cardiology Office Note   Date:  10/17/2015   ID:  [name], DOB 1940/10/25, MRN [id number]  PCP:  Ria Bush, MD  Cardiologist:  Dr. Burt Knack  Chief Complaint  Patient presents with  . Follow-up    seen for Dr. Burt Knack, L arm numbness      History of Present Illness: [name] is a 75 y.o. female who presents for office visit to evaluate left arm numbness. She has a history of hypertension, history of lung cancer s/p resection, history of R MCA CVA in 2013, GERD, CKD stage III, CAD s/p MI in 2010 treated with bare-metal stent to left circumflex, and chronic combined systolic and diastolic heart failure. Her most recent echocardiogram obtained on 07/09/2012 showed EF 45-50%, akinesis of the posterior wall, mild hypokinesis of lateral wall and basal wall, mild MR. He was admitted with MCA stroke in December 2013, there were multiple areas of infarction and evidence of previous hemorrhagic stroke. She has a history of non-small cell lung cancer in the right lower lobe segmental resection was chronic dyspnea.  She had sent onset of left arm numbness that woke her up at 3 AM in the morning of 10/15/2015. Described her previous angina plus chest pain and neck pain that did not go away doesn't matter what she do. She states she does have occasional chest discomfort, however she has not experienced any chest pain in the last 6 months. She states she has never followed up with her neurologist after her previous stroke. she did not remember how long the left arm numbness lasted, however it eventually resolved without further issue. She is not sure if it's neurological versus cardiac, therefore she called our office on 3/22, there was no opening, so she was placed on my schedule to be seen on the following day. By the time I saw her, more than 48 hours has passed since the onset of her symptoms and more than 24 hours has passed since the resolution of her symptoms. EKG obtained in the office  shows ST depression in lead 2 and 3 despite her being completely asymptomatic. Looking back on the previous EKG, she had some mild ST depression on the EKG for the past several years and today's EKG is unchanged. She does have a lot of PVCs on the EKG.    Past Medical History  Diagnosis Date  . HTN (hypertension)   . Arthritis   . Macular degeneration   . Myocardial infarction [facility]) 03/2009    Acute myocardial infarction 2010 - treated with BMS of LCx. LVEF 50% with subsequent CHF  . Lung cancer ([place]) dx'd 07/2006    Lung CA, s/p resection, followed by Dr Earlie Server  . History of CVA (cerebrovascular accident) without residual deficits 01/2012, 06/2012    R hemorrhagic MCA 01/2012 with remote lacunar infarct L putamen and IC, rpt 06/2012 acute multifocal R MCA infarct with remote hemorrhagic strokes affecting L basal ganglia and periventricular white matter, full recovery  . Emphysema/COPD ([place])   . GERD (gastroesophageal reflux disease)   . CAD (coronary artery disease) 03/2009    s/p MI  . HLD (hyperlipidemia)   . Ex-smoker quit 2008  . Chronic combined systolic and diastolic heart failure ([place]) 06/17/2009    Qualifier: Diagnosis of  By: Burt Knack, MD, Clayburn Pert   . Ischemic cardiomyopathy 02/27/2014  . Helicobacter pylori gastritis 10/2007    treated  . Osteoporosis 09/2013    T -3.6 forearm 09/2013, T -4.5 forearm  04/2015  . CKD (chronic kidney disease) stage 3, GFR 30-59 ml/min     Past Surgical History  Procedure Laterality Date  . Lung removal, partial Right 2008  . Partial hysterectomy  1986    irregular periods, ovaries remain  . Breast biopsy Left   . Cataract extraction Bilateral      Current Outpatient Prescriptions  Medication Sig Dispense Refill  . albuterol (PROAIR HFA) 108 (90 BASE) MCG/ACT inhaler Inhale 2 puffs into the lungs every 4 (four) hours as needed for wheezing or shortness of breath. 1 Inhaler 1  . aspirin 325 MG tablet Take 1 tablet (325 mg total) by mouth  daily.    . Calcium Carbonate-Vitamin D (CALCIUM-VITAMIN D) 500-200 MG-UNIT per tablet Take 2 tablets by mouth daily.    . carvedilol (COREG) 3.125 MG tablet TAKE 1 TABLET BY MOUTH TWICE A DAY WITH FOOD 60 tablet 10  . Cholecalciferol (VITAMIN D) 2000 UNITS CAPS Take 1 capsule by mouth daily.    Marland Kitchen denosumab (PROLIA) 60 MG/ML SOLN injection Inject 60 mg into the skin every 6 (six) months. Administer in upper arm, thigh, or abdomen 60 mL 1  . furosemide (LASIX) 40 MG tablet TAKE 1 TABLET BY MOUTH DAILY 30 tablet 11  . isosorbide mononitrate (IMDUR) 30 MG 24 hr tablet Take 1 tablet (30 mg total) by mouth daily. 30 tablet 11  . losartan (COZAAR) 50 MG tablet TAKE 1 TABLET BY MOUTH DAILY 30 tablet 11  . LOTEMAX 0.5 % ophthalmic suspension Place 1 drop into both eyes daily.     . meclizine (ANTIVERT) 25 MG tablet Take 1 tablet (25 mg total) by mouth 3 (three) times daily as needed for dizziness. 30 tablet 0  . nitroGLYCERIN (NITROSTAT) 0.4 MG SL tablet Place 1 tablet (0.4 mg total) under the tongue every 5 (five) minutes as needed for chest pain. 25 tablet 3  . omeprazole (PRILOSEC) 20 MG capsule TAKE ONE CAPSULE BY MOUTH DAILY 30 capsule 11  . simvastatin (ZOCOR) 40 MG tablet TAKE ONE TABLET BY MOUTH AT BEDTIME 30 tablet 11  . spironolactone (ALDACTONE) 25 MG tablet TAKE 1/2  TABLET BY MOUTH ONCE DAILY. 30 tablet 6   No current facility-administered medications for this visit.    Allergies:   Actonel; Amlodipine; Fosamax; Lisinopril; Pravastatin; and Codeine    Social History:  The patient  reports that she quit smoking about 9 years ago. Her smoking use included Cigarettes. She has a 80 pack-year smoking history. She has never used smokeless tobacco. She reports that she does not drink alcohol or use illicit drugs.   Family History:  The patient's family history includes Breast cancer in her sister; CAD (age of onset: 32) in her father and mother; Diabetes in her sister; Heart attack in her  mother; Hypertension in her mother; Stroke in her father and mother.    ROS:  Please see the history of present illness.   Otherwise, review of systems are positive for L arm numbness.   All other systems are reviewed and negative.    PHYSICAL EXAM: VS:  BP 130/52 mmHg  Pulse 64  Ht '5\' 6"'$  (1.676 m)  Wt 121 lb 1.9 oz (54.94 kg)  BMI 19.56 kg/m2 , BMI Body mass index is 19.56 kg/(m^2). GEN: Well nourished, well developed, in no acute distress HEENT: normal Neck: no JVD, carotid bruits, or masses Cardiac: RRR; no murmurs, rubs, or gallops,no edema  Respiratory:  clear to auscultation bilaterally, normal work of breathing  GI: soft, nontender, nondistended, + BS MS: no deformity or atrophy Skin: warm and dry, no rash Neuro:  Strength and sensation are intact Psych: euthymic mood, full affect   EKG:  EKG is ordered today. The ekg ordered today demonstrates NSR with ST depression in inferior leads, frequent PVCs   Recent Labs: 10/26/2014: ALT 13 04/20/2015: BUN 20; Creatinine, Ser 1.06*; Hemoglobin 13.6; Platelets 264; Potassium 4.0; Sodium 134*    Lipid Panel    Component Value Date/Time   CHOL 140 03/28/2015 0817   TRIG 62.0 03/28/2015 0817   HDL 57.50 03/28/2015 0817   CHOLHDL 2 03/28/2015 0817   VLDL 12.4 03/28/2015 0817   LDLCALC 71 03/28/2015 0817      Wt Readings from Last 3 Encounters:  10/17/15 121 lb 1.9 oz (54.94 kg)  10/07/15 124 lb 12 oz (56.586 kg)  09/05/15 122 lb (55.339 kg)      Other studies Reviewed: Additional studies/ records that were reviewed today include:   Echo 07/09/2012 - Left ventricle: LVEF is approximately 45 to 50%with akinesis of the posterior wall (base, mid); mild hypokinesis of the lateral wall and basal inferior wall. The cavity size was mildly dilated. - Mitral valve: Mild regurgitation   Review of the above records demonstrates:   History of CVA and CAD presented with atypical symptoms of left arm numbness started  more than 48 hours ago   ASSESSMENT AND PLAN:  1.  Left arm numbness:  - Unclear cause, symptom did not associated with any chest discomfort, EKG does show ST depression in the inferior lead which has been chronic compared to the previous EKG. I discussed with Dr. Marlou Porch regarding the EKG and the treatment plan. Will obtain a stat troponin in the office, if troponin negative and EKG unchanged compared with previous EKG and because the patient has been asymptomatic for longer than 24 hours, would not plan for further workup.   - will obtain basic metabolic panel to check her electrolyte. With left arm weakness and her history of right MCA stroke, Dr. Marlou Porch is concerned about potential progression of her neurological issues. Since she has not seen her neurologist for many years, we will refer her to neurology service. This is likely not emergent given the fact that her symptom occurred born at 48 hours ago and has resolved more than 24 hours ago. However given her history of stroke, she should be followed by a neurologist.   2. Hypertension: well controlled  4. history of R MCA CVA in 2013: will need to follow by a neurologist, she has not been compliant with followup with neurology  5. CKD stage III  6. Chronic combined systolic and diastolic heart failure: no sign of acute CHF exacerbation     Current medicines are reviewed at length with the patient today.  The patient does not have concerns regarding medicines.  The following changes have been made:  no change  Labs/ tests ordered today include:   Orders Placed This Encounter  Procedures  . Basic Metabolic Panel (BMET)  . Troponin I  . Ambulatory referral to Neurology  . EKG 12-Lead     Disposition:   FU with Dr. Burt Knack in 6 months  Signed, [name], [place]  10/17/2015 6:09 PM    [address], [place], [place]  [address] Phone: [phone]; Fax: [phone]

## 2015-11-14 ENCOUNTER — Ambulatory Visit (INDEPENDENT_AMBULATORY_CARE_PROVIDER_SITE_OTHER): Payer: PPO | Admitting: Neurology

## 2015-11-14 ENCOUNTER — Encounter: Payer: Self-pay | Admitting: Neurology

## 2015-11-14 ENCOUNTER — Telehealth: Payer: Self-pay | Admitting: Cardiovascular Disease

## 2015-11-14 VITALS — BP 110/64 | HR 56 | Ht 66.0 in | Wt 119.0 lb

## 2015-11-14 DIAGNOSIS — G459 Transient cerebral ischemic attack, unspecified: Secondary | ICD-10-CM

## 2015-11-14 DIAGNOSIS — Z8673 Personal history of transient ischemic attack (TIA), and cerebral infarction without residual deficits: Secondary | ICD-10-CM | POA: Diagnosis not present

## 2015-11-14 NOTE — Telephone Encounter (Signed)
Pt aware she has been scheduled to see Dr Burt Knack 11/22/15 at 11:15AM.

## 2015-11-14 NOTE — Telephone Encounter (Signed)
New Message  Pt called states that Dr. York Cerise nurse would work her in for an Calcium. Pt states that this has been for over 1 month now and is req a call back.   No Symptoms. Refused PA and NP. Simply wants to see Dr. Burt Knack.

## 2015-11-14 NOTE — Patient Instructions (Addendum)
1.  CT head without contrast 2.  US carotids  3.  Continue aspirin '325mg'$  daily 4.  If symptoms recur, she will need MRI brain (declined by patient today) and routine EEG  Return to clinic 3 months  You will always be at an increased risk of stroke.  Stroke is a medical emergency.  Know these warning signs of stroke. Every second counts:  Sudden numbness or weakness of the face, arm or leg, especially on one side of the body,  Sudden confusion, trouble speaking or understanding,  Sudden trouble seeing in one eye or both eyes,  Sudden trouble walking, dizziness, loss of balance or coordination,  Sudden severe headache with no known cause.  If you or someone with you has one or more of these signs, don't delay!  Immediately call [id number], or the emergency medical services (EMS) number so an ambulance (ideally with advanced life support) can be sent for you.  Also, check the time so that you will know when the symptoms first appeared. It's very important to take immediate action. Medical treatment may be available if action is taken early enough.

## 2015-11-14 NOTE — Progress Notes (Signed)
[facility] Clinic Note - Initial Visit   Date: 11/14/2015  [name] MRN: [id number] DOB: 1941/05/08   Dear Jodi Deforest, PA:  Thank you for your kind referral of [name] for consultation of left arm numbness. Although her history is well known to you, please allow [place] to reiterate it for the purpose of our medical record. The patient was accompaied to the clinic by self.    History of Present Illness: [name] is a 75 y.o. right-handed Caucasian female with hypertension, history of RMA stroke (2013), CKD stage III, heart failure, non-small cell lung cancer s/p resection presenting for evaluation of left arm numbness.    On 10/15/2014, she woke up with left arm numbness at 3am, which involved her entire left arm.  Left face and leg was not involved.  There was no associated weakness.  Because she was unsure if this was due to stroke or cardiac reasons, she took three nitroglycerin tablets and it slowly improved within 30-45 minutes.  She then went back to sleep and did not have any other spells.  The following morning, she called to schedule an appointment with her cardiologist who then referred her to neurology because of her prior history of RMCA stroke in 2013.  In 2013, her stroke manifested with left hemiplegia, facial weakness, and dysarthria and was found to have multifocal R MCA territory infarcts.  Vessel imaging did not show any stenosis or occlusion of the head or neck.  She was continued on aspirin therapy, which she has been complaint with.  She does not have any residual deficits.   Out-side paper records, electronic medical record, and images have been reviewed where available and summarized as:  MRI brain wo contrast 04/20/2015:  1. No acute intracranial abnormality. 2. Chronically advanced ischemic disease. Progressed right MCA infarcts since 2013.  MRI brain wo contrast 07/09/2012:  Multifocal areas of acute infarction, right MCA  territory. These are new from the prior study 02/18/2012.  Chronic changes as described. MRA head 07/09/2012:  Mild irregularity distal left vertebral is noted. This is non flow reducing. Otherwise unremarkable MRA. No significant change from priors.  Lab Results  Component Value Date   TSH 1.174 07/09/2012   Lab Results  Component Value Date   HGBA1C 6.1 03/28/2015   Lab Results  Component Value Date   CHOL 140 03/28/2015   HDL 57.50 03/28/2015   LDLCALC 71 03/28/2015   TRIG 62.0 03/28/2015   CHOLHDL 2 03/28/2015    Past Medical History  Diagnosis Date  . HTN (hypertension)   . Arthritis   . Macular degeneration   . Myocardial infarction [facility]) 03/2009    Acute myocardial infarction 2010 - treated with BMS of LCx. LVEF 50% with subsequent CHF  . Lung cancer ([place]) dx'd 07/2006    Lung CA, s/p resection, followed by Dr Earlie Server  . History of CVA (cerebrovascular accident) without residual deficits 01/2012, 06/2012    R hemorrhagic MCA 01/2012 with remote lacunar infarct L putamen and IC, rpt 06/2012 acute multifocal R MCA infarct with remote hemorrhagic strokes affecting L basal ganglia and periventricular white matter, full recovery  . Emphysema/COPD ([place])   . GERD (gastroesophageal reflux disease)   . CAD (coronary artery disease) 03/2009    s/p MI  . HLD (hyperlipidemia)   . Ex-smoker quit 2008  . Chronic combined systolic and diastolic heart failure ([place]) 06/17/2009    Qualifier: Diagnosis of  By: Burt Knack, MD, Clayburn Pert   .  Ischemic cardiomyopathy 02/27/2014  . Helicobacter pylori gastritis 10/2007    treated  . Osteoporosis 09/2013    T -3.6 forearm 09/2013, T -4.5 forearm 04/2015  . CKD (chronic kidney disease) stage 3, GFR 30-59 ml/min     Past Surgical History  Procedure Laterality Date  . Lung removal, partial Right 2008  . Partial hysterectomy  1986    irregular periods, ovaries remain  . Breast biopsy Left   . Cataract extraction Bilateral       Medications:  Outpatient Encounter Prescriptions as of 11/14/2015  Medication Sig  . albuterol (PROAIR HFA) 108 (90 BASE) MCG/ACT inhaler Inhale 2 puffs into the lungs every 4 (four) hours as needed for wheezing or shortness of breath.  Marland Kitchen aspirin 325 MG tablet Take 1 tablet (325 mg total) by mouth daily.  . Calcium Carbonate-Vitamin D (CALCIUM-VITAMIN D) 500-200 MG-UNIT per tablet Take 2 tablets by mouth daily.  . carvedilol (COREG) 3.125 MG tablet TAKE 1 TABLET BY MOUTH TWICE A DAY WITH FOOD  . Cholecalciferol (VITAMIN D) 2000 UNITS CAPS Take 1 capsule by mouth daily.  Marland Kitchen denosumab (PROLIA) 60 MG/ML SOLN injection Inject 60 mg into the skin every 6 (six) months. Administer in upper arm, thigh, or abdomen  . furosemide (LASIX) 40 MG tablet TAKE 1 TABLET BY MOUTH DAILY  . isosorbide mononitrate (IMDUR) 30 MG 24 hr tablet Take 1 tablet (30 mg total) by mouth daily.  Marland Kitchen losartan (COZAAR) 50 MG tablet TAKE 1 TABLET BY MOUTH DAILY  . LOTEMAX 0.5 % ophthalmic suspension Place 1 drop into both eyes daily.   . meclizine (ANTIVERT) 25 MG tablet Take 1 tablet (25 mg total) by mouth 3 (three) times daily as needed for dizziness.  . nitroGLYCERIN (NITROSTAT) 0.4 MG SL tablet Place 1 tablet (0.4 mg total) under the tongue every 5 (five) minutes as needed for chest pain.  Marland Kitchen omeprazole (PRILOSEC) 20 MG capsule TAKE ONE CAPSULE BY MOUTH DAILY  . simvastatin (ZOCOR) 40 MG tablet TAKE ONE TABLET BY MOUTH AT BEDTIME  . spironolactone (ALDACTONE) 25 MG tablet TAKE 1/2  TABLET BY MOUTH ONCE DAILY.   No facility-administered encounter medications on file as of 11/14/2015.     Allergies:  Allergies  Allergen Reactions  . Actonel [Risedronate Sodium] Other (See Comments)    Headache  . Amlodipine Other (See Comments)    Pedal edema  . Fosamax [Alendronate Sodium] Other (See Comments)    Unable to tolerate  . Lisinopril Cough  . Pravastatin Other (See Comments)    Constipation.  . Codeine Rash     Family History: Family History  Problem Relation Age of Onset  . CAD Mother 67    MI  . Breast cancer Sister   . CAD Father 26  . Stroke Mother   . Stroke Father   . Diabetes Sister   . Hypertension Mother   . Heart attack Mother     Social History: Social History  Substance Use Topics  . Smoking status: Former Smoker -- 2.00 packs/day for 40 years    Types: Cigarettes    Quit date: 07/27/2006  . Smokeless tobacco: Never Used  . Alcohol Use: No   Social History   Social History Narrative   The patient is a widow and has one son and one dog.     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  She does not drink alcohol. 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   Ed: HS   Activity: no regular exercise   Diet: some water, some fruits/vegetables    Lives in a one story home.     Review of Systems:  CONSTITUTIONAL: No fevers, chills, night sweats, or weight loss.   EYES: No visual changes or eye pain ENT: No hearing changes.  No history of nose bleeds.   RESPIRATORY: No cough, wheezing and shortness of breath.   CARDIOVASCULAR: Negative for chest pain, and palpitations.   GI: Negative for abdominal discomfort, blood in stools or black stools.  No recent change in bowel habits.   GU:  No history of incontinence.   MUSCLOSKELETAL: No history of joint pain or swelling.  No myalgias.   SKIN: Negative for lesions, rash, and itching.   HEMATOLOGY/ONCOLOGY: Negative for prolonged bleeding, bruising easily, and swollen nodes.  No history of cancer.   ENDOCRINE: Negative for cold or heat intolerance, polydipsia or goiter.   PSYCH:  No depression or anxiety symptoms.   NEURO: As Above.   Vital Signs:  BP 110/64 mmHg  Pulse 56  Ht '5\' 6"'$  (1.676 m)  Wt 119 lb (53.978 kg)  BMI 19.22 kg/m2 Pain Scale: 0 on a scale of 0-10   General Medical Exam:   General:  Well appearing, comfortable.   Eyes/ENT: see cranial nerve  examination.   Neck: No masses appreciated.  Full range of motion without tenderness.  No carotid bruits. Respiratory:  Clear to auscultation, good air entry bilaterally.   Cardiac:  Regular rate and rhythm, no murmur.   Extremities:  No deformities, edema, or skin discoloration.  Skin:  No rashes or lesions.  Neurological Exam: MENTAL STATUS including orientation to time, place, person, recent and remote memory, attention span and concentration, language, and fund of knowledge is normal.  Speech is not dysarthric.  CRANIAL NERVES: II:  No visual field defects.  Unremarkable fundi.   III-IV-VI: Pupils equal round and reactive to light.  Normal conjugate, extra-ocular eye movements in all directions of gaze.  No nystagmus.  No ptosis.   V:  Normal facial sensation.    VII:  Normal facial symmetry and movements.   VIII:  Normal hearing and vestibular function.   IX-X:  Normal palatal movement.   XI:  Normal shoulder shrug and head rotation.   XII:  Normal tongue strength and range of motion, no deviation or fasciculation.  MOTOR:  No atrophy, fasciculations or abnormal movements.  No pronator drift.  Tone is normal.    Right Upper Extremity:    Left Upper Extremity:    Deltoid  5/5   Deltoid  5/5   Biceps  5/5   Biceps  5/5   Triceps  5/5   Triceps  5/5   Wrist extensors  5/5   Wrist extensors  5/5   Wrist flexors  5/5   Wrist flexors  5/5   Finger extensors  5/5   Finger extensors  5/5   Finger flexors  5/5   Finger flexors  5/5   Dorsal interossei  5/5   Dorsal interossei  5/5   Abductor pollicis  5/5   Abductor pollicis  5/5   Tone (Ashworth scale)  0  Tone (Ashworth scale)  0   Right Lower Extremity:    Left Lower Extremity:    Hip flexors  5/5   Hip flexors  5/5   Hip extensors  5/5   Hip extensors  5/5  Knee flexors  5/5   Knee flexors  5/5   Knee extensors  5/5   Knee extensors  5/5   Dorsiflexors  5/5   Dorsiflexors  5/5   Plantarflexors  5/5   Plantarflexors  5/5    Toe extensors  5/5   Toe extensors  5/5   Toe flexors  5/5   Toe flexors  5/5   Tone (Ashworth scale)  0  Tone (Ashworth scale)  0   MSRs:  Right                                                                 Left brachioradialis 2+  brachioradialis 3+  biceps 2+  biceps 3+  triceps 2+  triceps 3+  patellar 2+  patellar 3+  ankle jerk 2+  ankle jerk 2+  Hoffman no  Hoffman no  plantar response down  plantar response up   SENSORY:  Normal and symmetric perception of light touch, pinprick, vibration, and proprioception.  Romberg's sign absent.   COORDINATION/GAIT: Normal finger-to- nose-finger and heel-to-shin.  Intact rapid alternating movements bilaterally.  Able to rise from a chair without using arms.  Gait narrow based and stable. Tandem and stressed gait intact.    IMPRESSION: Ms. Tetrault is a 75 year-old female with history of RMCA territory infarct (2013) with no residual deficits presenting with a transient spell of left arm numbness.  Her neurological exam does not show any deficits on today's exam.  TIA is suspected.  Etiology for her last stroke is unclear as there was no large vessel stenosis/occlusion, arrhythmia, or embolic source.  Because she has a known intracranial lesion, seizure is possible but thought to be less likely.    PLAN/RECOMMENDATIONS:  1.  CT head without contrast 2.  US carotids  3.  Continue aspirin '325mg'$  daily 4.  If symptoms recur, she will need MRI brain (declined by patient today), switch to plavix '75mg'$  daily, and routine EEG  Return to clinic in 3 months.   The duration of this appointment visit was 40 minutes of face-to-face time with the patient.  Greater than 50% of this time was spent in counseling, explanation of diagnosis, planning of further management, and coordination of care.   Thank you for allowing me to participate in patient's care.  If I can answer any additional questions, I would be pleased to do so.     Sincerely,    Charlei Ramsaran K. Posey Pronto, DO

## 2015-11-18 ENCOUNTER — Ambulatory Visit (INDEPENDENT_AMBULATORY_CARE_PROVIDER_SITE_OTHER)
Admission: RE | Admit: 2015-11-18 | Discharge: 2015-11-18 | Disposition: A | Discharge status: Home / Self Care | Payer: PPO | Source: Ambulatory Visit | Attending: Emergency Medicine | Admitting: Emergency Medicine

## 2015-11-18 DIAGNOSIS — R911 Solitary pulmonary nodule: Secondary | ICD-10-CM | POA: Diagnosis not present

## 2015-11-19 ENCOUNTER — Other Ambulatory Visit: Payer: Self-pay | Admitting: Cardiovascular Disease

## 2015-11-22 ENCOUNTER — Ambulatory Visit (INDEPENDENT_AMBULATORY_CARE_PROVIDER_SITE_OTHER): Payer: PPO | Admitting: Cardiovascular Disease

## 2015-11-22 ENCOUNTER — Encounter: Payer: Self-pay | Admitting: Cardiovascular Disease

## 2015-11-22 ENCOUNTER — Other Ambulatory Visit: Payer: Self-pay | Admitting: Adult Health

## 2015-11-22 VITALS — BP 110/58 | HR 52 | Ht 66.0 in | Wt 119.0 lb

## 2015-11-22 DIAGNOSIS — I5022 Chronic systolic (congestive) heart failure: Secondary | ICD-10-CM | POA: Diagnosis not present

## 2015-11-22 DIAGNOSIS — R918 Other nonspecific abnormal finding of lung field: Secondary | ICD-10-CM

## 2015-11-22 DIAGNOSIS — C3431 Malignant neoplasm of lower lobe, right bronchus or lung: Secondary | ICD-10-CM

## 2015-11-22 NOTE — Progress Notes (Signed)
Cardiology Office Note Date:  11/22/2015   ID:  [name], DOB 1941-03-12, MRN [id number]  PCP:  Ria Bush, MD  Cardiologist:  Sherren Mocha, MD    Chief Complaint  Patient presents with  . Coronary Artery Disease    no cp,lee, or claudication. Has sob but is unchanged from previous visit   History of Present Illness: [name] is a 75 y.o. female who presents for follow-up evaluation. She was last seen in October 2016. She's been followed for coronary artery disease and chronic heart failure. She initially presented with a myocardial infarction in 2010. Her left circumflex was occluded at that time she underwent primary PCI with a bare-metal stent. She's had significant LV dysfunction related to akinesis of the posterolateral myocardium. Her most recent echo estimated an LVEF of 45% in December 2013. The patient has a history of right MCA stroke in 2013. She also has a history of non-small cell lung cancer and underwent a curative resection. Patient has COPD and is followed by pulmonary.  Was seen by how May and March after an episode of left arm numbness. She was referred to neurology and was felt to likely have had a TIA. She's had no recurrent symptoms. She reports no change in her chronic shortness of breath and overall is doing well from a cardiac perspective. She denies chest pain, edema, or heart palpitations.   Past Medical History  Diagnosis Date  . HTN (hypertension)   . Arthritis   . Macular degeneration   . Myocardial infarction [facility]) 03/2009    Acute myocardial infarction 2010 - treated with BMS of LCx. LVEF 50% with subsequent CHF  . Lung cancer ([place]) dx'd 07/2006    Lung CA, s/p resection, followed by Dr Earlie Server  . History of CVA (cerebrovascular accident) without residual deficits 01/2012, 06/2012    R hemorrhagic MCA 01/2012 with remote lacunar infarct L putamen and IC, rpt 06/2012 acute multifocal R MCA infarct with remote hemorrhagic strokes  affecting L basal ganglia and periventricular white matter, full recovery  . Emphysema/COPD ([place])   . GERD (gastroesophageal reflux disease)   . CAD (coronary artery disease) 03/2009    s/p MI  . HLD (hyperlipidemia)   . Ex-smoker quit 2008  . Chronic combined systolic and diastolic heart failure ([place]) 06/17/2009    Qualifier: Diagnosis of  By: Burt Knack, MD, Clayburn Pert   . Ischemic cardiomyopathy 02/27/2014  . Helicobacter pylori gastritis 10/2007    treated  . Osteoporosis 09/2013    T -3.6 forearm 09/2013, T -4.5 forearm 04/2015  . CKD (chronic kidney disease) stage 3, GFR 30-59 ml/min     Past Surgical History  Procedure Laterality Date  . Lung removal, partial Right 2008  . Partial hysterectomy  1986    irregular periods, ovaries remain  . Breast biopsy Left   . Cataract extraction Bilateral     Current Outpatient Prescriptions  Medication Sig Dispense Refill  . albuterol (PROAIR HFA) 108 (90 BASE) MCG/ACT inhaler Inhale 2 puffs into the lungs every 4 (four) hours as needed for wheezing or shortness of breath. 1 Inhaler 1  . aspirin 325 MG tablet Take 1 tablet (325 mg total) by mouth daily.    . Calcium Carbonate-Vitamin D (CALCIUM-VITAMIN D) 500-200 MG-UNIT per tablet Take 2 tablets by mouth daily.    . carvedilol (COREG) 3.125 MG tablet TAKE 1 TABLET BY MOUTH TWICE A DAY WITH FOOD 60 tablet 10  . Cholecalciferol (VITAMIN D) 2000 UNITS  CAPS Take 1 capsule by mouth daily.    Marland Kitchen denosumab (PROLIA) 60 MG/ML SOLN injection Inject 60 mg into the skin every 6 (six) months. Administer in upper arm, thigh, or abdomen 60 mL 1  . furosemide (LASIX) 40 MG tablet TAKE 1 TABLET BY MOUTH DAILY 30 tablet 11  . isosorbide mononitrate (IMDUR) 30 MG 24 hr tablet TAKE 1 TABLET BY MOUTH DAILY 30 tablet 3  . losartan (COZAAR) 50 MG tablet TAKE 1 TABLET BY MOUTH DAILY 30 tablet 11  . LOTEMAX 0.5 % ophthalmic suspension Place 1 drop into both eyes daily.     . meclizine (ANTIVERT) 25 MG tablet Take 1  tablet (25 mg total) by mouth 3 (three) times daily as needed for dizziness. 30 tablet 0  . nitroGLYCERIN (NITROSTAT) 0.4 MG SL tablet Place 1 tablet (0.4 mg total) under the tongue every 5 (five) minutes as needed for chest pain. 25 tablet 3  . omeprazole (PRILOSEC) 20 MG capsule TAKE ONE CAPSULE BY MOUTH DAILY 30 capsule 11  . simvastatin (ZOCOR) 40 MG tablet TAKE ONE TABLET BY MOUTH AT BEDTIME 30 tablet 11  . spironolactone (ALDACTONE) 25 MG tablet TAKE 1/2  TABLET BY MOUTH ONCE DAILY. 30 tablet 6   No current facility-administered medications for this visit.    Allergies:   Actonel; Amlodipine; Fosamax; Lisinopril; Pravastatin; and Codeine   Social History:  The patient  reports that she quit smoking about 9 years ago. Her smoking use included Cigarettes. She has a 80 pack-year smoking history. She has never used smokeless tobacco. She reports that she does not drink alcohol or use illicit drugs.   Family History:  The patient's  family history includes Breast cancer in her sister; CAD (age of onset: 20) in her father and mother; Diabetes in her sister; Heart attack in her mother; Hypertension in her mother; Stroke in her father and mother.    ROS:  Please see the history of present illness.  Otherwise, review of systems is positive for DOE.  All other systems are reviewed and negative.    PHYSICAL EXAM: VS:  BP 110/58 mmHg  Pulse 52  Ht '5\' 6"'$  (1.676 m)  Wt 119 lb (53.978 kg)  BMI 19.22 kg/m2  SpO2 97% , BMI Body mass index is 19.22 kg/(m^2). GEN: Well nourished, well developed, in no acute distress HEENT: normal Neck: no JVD, no masses. No carotid bruits Cardiac: RRR without murmur or gallop                Respiratory:  clear to auscultation bilaterally, normal work of breathing GI: soft, nontender, nondistended, + BS MS: no deformity or atrophy Ext: no pretibial edema, pedal pulses 2+= bilaterally Skin: warm and dry, no rash Neuro:  Strength and sensation are intact Psych:  euthymic mood, full affect  EKG:  EKG is not ordered today.  Recent Labs: 04/20/2015: Hemoglobin 13.6; Platelets 264 10/17/2015: BUN 13; Creat 0.81; Potassium 3.9; Sodium 137   Lipid Panel     Component Value Date/Time   CHOL 140 03/28/2015 0817   TRIG 62.0 03/28/2015 0817   HDL 57.50 03/28/2015 0817   CHOLHDL 2 03/28/2015 0817   VLDL 12.4 03/28/2015 0817   LDLCALC 71 03/28/2015 0817      Wt Readings from Last 3 Encounters:  11/22/15 119 lb (53.978 kg)  11/14/15 119 lb (53.978 kg)  10/17/15 121 lb 1.9 oz (54.94 kg)     ASSESSMENT AND PLAN: 1. CAD with old MI: no symptoms  of angina. Stable on current medical therapy. No changes made today.  2. Hyperlipidemia: Lipids from September 2016 reviewed. LDL 71 mg/dL. Continue simvastatin.  3. Chronic systolic CHF: NYHA II. The patient is tolerating medical therapy well. She will continue on carvedilol, isosorbide, losartan, and spironolactone. No recent change in symptoms.  4. Possible TIA and history of stroke: Patient continues on aspirin and secondary risk reduction measures. She has limited resources and would like to cancel her carotid ultrasound. Previous ultrasound study from a few years ago showed mild plaquing with no obstruction.  Current medicines are reviewed with the patient today.  The patient does not have concerns regarding medicines.  Labs/ tests ordered today include:  No orders of the defined types were placed in this encounter.   Disposition:   FU one year  Signed, Sherren Mocha, MD  11/22/2015 11:21 AM    [facility] [place], [place], [place]  [address] Phone: [phone]; Fax: [phone]

## 2015-11-22 NOTE — Patient Instructions (Signed)

## 2015-11-26 ENCOUNTER — Encounter (HOSPITAL_COMMUNITY): Payer: PPO

## 2015-11-27 ENCOUNTER — Ambulatory Visit (INDEPENDENT_AMBULATORY_CARE_PROVIDER_SITE_OTHER): Payer: PPO | Admitting: Emergency Medicine

## 2015-11-27 ENCOUNTER — Encounter (HOSPITAL_COMMUNITY): Payer: PPO

## 2015-11-27 ENCOUNTER — Encounter: Payer: Self-pay | Admitting: Emergency Medicine

## 2015-11-27 VITALS — BP 96/62 | HR 52 | Ht 66.0 in | Wt 122.0 lb

## 2015-11-27 DIAGNOSIS — R911 Solitary pulmonary nodule: Secondary | ICD-10-CM

## 2015-11-27 DIAGNOSIS — C3431 Malignant neoplasm of lower lobe, right bronchus or lung: Secondary | ICD-10-CM

## 2015-11-27 DIAGNOSIS — J449 Chronic obstructive pulmonary disease, unspecified: Secondary | ICD-10-CM | POA: Diagnosis not present

## 2015-11-27 NOTE — Assessment & Plan Note (Signed)
Stable at this time on SABA prn. Will continue to follow, start LAMA in the future if she clinically changes.

## 2015-11-27 NOTE — Addendum Note (Signed)
Addended by: Maryanna Shape A on: 11/27/2015 04:24 PM   Modules accepted: Orders, SmartSet

## 2015-11-27 NOTE — Assessment & Plan Note (Signed)
Now released from oncology clinic and starting survivorship appointments

## 2015-11-27 NOTE — Assessment & Plan Note (Signed)
Slightly larger based on CT scan from 11/18/15, but this is in an area that looks like scar. Also note no hypermetabolism on PET from 11/16. I believe we can continue to follow it, make decision on bx based on interval change.

## 2015-11-27 NOTE — Progress Notes (Signed)
Subjective:    Patient ID: [name], female    DOB: [birth year], 75 y.o.   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 She has been having dyspnea for > 1 year, some cough that is productive of white mucous. She has hoarse voice frequently, not necessarily related to cough. Not clear that it relates in time to symbicort.   Part of her eval has included CXR that showed a 1.4x0.6cm  L apical nodule, confirmed by CT scan 09/28/12. PET on 10/07/12 showed the nodule was cold.   She is on Symbicort qam only for over a year, feels that it helps her.   PFT 09/02/12 >> moderately severe AFL without BD response, normal volumes, decreased diffusion.   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 PET negative 1.4x0.6cm  L apical nodule 10/07/12. We stopped symbicort, started spiriva last time.  She doesn't think the SOB has changed any since starting. She is still having some mucous but better. Still has cough, esp in the am. She doesn't have a SABA. She is due for CT scan chest w Dr Julien Nordmann in 9/'14. Not on nasal steroid or anti-histamine.   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 PET negative 1.4x0.6cm  L apical nodule 10/07/12. Stable by CT scan 04/10/13.  Had been doing well until this week when she got sore throat, dry cough. Using spiriva, albuterol prn > about once a day.  She has flonase at home but hasn't used it yet.   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 We are following PET negative L apical nodule, repeat Ct scan due in September. She stopped her spiriva due to some associated SOB, ? Due to UA  effects. She tells me that she had a possible rxn to fosamax, caused dyspnea and ? Throat swelling. She feels that her breathing is better. She uses 1 puff her rescue SABA about once a day. Doesn't feel that she misses the spiriva.   ROV 12/04/14 -- follow-up visit for COPD and an abnormal CT scan with a left apical nodule. She also has a history of adenocarcinoma status post a right lower lobe superior segmentectomy in 2008.  Her most recent CT scan of the chest was performed in October 2015 and was stable without any new findings. The left apical nodule was unchanged.  She has been having more breathing trouble since the weather has gotten warmer.  She has heard some wheeze, some allergy sx.  She states that she had associated CP with exertion. She could not tolerate fluticasone nasal spray - said it made her breathing worse. She is confused / confusing regarding which inhaled meds she has been on, when, and how she has responded to them. I believe she has tried albuterol for these sx without good response.   ROV 01/11/15 -- follow-up visit for history of COPD, adenocarcinoma of the lung status post right lower lobe superior segmentectomy, chest CT with a stable left apical nodule. She is due for another CT chest in October 2016. Last month I asked her to start Stiolto to see if she would benefit. She started  the med, developed diarrhea that has been happening since the [employer]. She decreased to qod, still has the diarrhea on the days when she takes it. She started flonase but it was ineffective. g  Follow up 02/12/15 -  : [place] NP / COPD-GOLD II  , Adenocarcinoma of lung s/p RLL superior segmentectomy, left apical nodules .  [name] .  Started on ANORO last month but has been unable to tolerate due to watery eyes, blurred vision  Has ov with eye doctor this week.  Previously on Stiolto but stopped due to diarrhea.  Does not want to be on any inhalers . Feels they make her worse.  Feels  her breathing is at baseline. Gets winded with activity. No dyspnea at rest.  No chest pain , orthopnea, edema or fever.   Last CT chest 04/2014 no acute finding, stable nodule . Upcoming CT in 04/2105   05/14/15 -- follow-up visit for gold B COPD, adenocarcinoma of the lung status post right lower lobe superior segmentectomy, left upper lobe pulmonary nodules that have been followed on serial CT scans of the chest. She's been tried on both Anoro and Stiolto but these were discontinued due to side effects.  She feels that her breathing is doing well. She rarely needs albuterol - does use it sometimes for cough, exertional SOB. She has daily cough, productive of clear. She has had a URI recently, now improving. She had vertigo - prompted a w/u for recurrence of her CVA. She is due for a repeat Ct scan of her chest   ROV 09/05/15 -- patient with a history of COPD, Gold stage B, as well as pulmonary nodules that have been followed on serial CT scans. Based on some change in size of right lower lobe and left lower lobe nodules PET scan was performed on 05/31/15. This scan showed low-grade activity in both nodules and no hypermetabolic activity in the left upper lobe. Recommendation was made to repeat a CT scan of the chest in April 2017.  She is currently doing very well - she is using SABA very rarely, is not on maintenance BD's.  She has not lost weight, no cough.   ROV 11/27/15 -- patient with a history of COPD, adenoCA of the lung, and pulmonary nodules that we have followed by CT scan of the chest. She had a PET scan in November 2016 as above. Her most recent CT scan of the chest was 11/18/15 that I personally reviewed today. Her left lower lobe superior segmental nodule may have changed slightly in size and shape but appears to be associated with evolving atx. Also, it was not hypermetabolic on PET from 82/95.  She has albuterol to use prn, uses it a few times a week.   Review of Systems     As per history  of present illness    Objective:   Physical Exam Filed Vitals:   11/27/15 1550 11/27/15 1551  BP:  96/62  Pulse:  52  Height: '5\' 6"'$  (1.676 m)   Weight: 122 lb (55.339 kg)   SpO2:  96%    Gen: Pleasant, elderly in no distress,  normal affect  ENT: No lesions,  mouth clear,  oropharynx clear, no postnasal drip, hoarse voice  Neck: No JVD, no TMG, no carotid bruits  Lungs: No use of accessory muscles, very distant, clear without rales or rhonchi  Cardiovascular: RRR, heart sounds normal, no murmur or gallops, no peripheral edema  Musculoskeletal: No deformities,  no cyanosis or clubbing  Neuro: alert, non focal  Skin: Warm, no lesions or rashes   11/18/15 --   COMPARISON: PET-CT - 05/31/2015; chest CT - 05/22/2015; 04/30/2014; 04/11/2013  FINDINGS: Mediastinum/Lymph Nodes: Scattered mediastinal lymph nodes are numerous though individually not enlarged by size criteria with index pretracheal lymph node measuring 0.8 cm in greatest short axis diameter (image 55, series 2). No bulky mediastinal, hilar axillary lymphadenopathy on this noncontrast examination.  Normal heart size. Coronary artery calcifications. A stent is seen within the proximal aspect of the left circumflex artery. No pericardial effusion.  Scattered calcified atherosclerotic plaque within a normal caliber thoracic aorta. Conventional configuration of the aortic arch.  Lungs/Pleura: Slightly spiculated nodule within the subpleural aspect the right lower lobe is grossly unchanged compared to the 05/22/2015 examination, measuring approximately 1.9 x 0.8 cm (image 124, series 3) with slight differences attributable to scan thickness.  Nodular opacity within the left lung apex is unchanged, measuring 1.8 x 0.9 cm (image 11, series 3, previously, 1.7 x 0.8 cm when compared to the 04/11/2013 examination.  Mixed solid and ground-glass nodule within the superior segment of the left lower lobe has  minimally increased in size in the interval, currently measuring approximately 1.5 x 0.6 cm (axial image 69, series 3), previously, 1.1 x 0.6 cm with more nodular component about its anterior and superior aspect (best seen on sagittal image 104, series 6), with slight differences potentially attributable to slice selection.  Stable postsurgical change of the right lower lobe with grossly unchanged vascular crowding about the right hilum (representative image 99, series 3).  Advanced mixed centrilobular and paraseptal emphysematous change with apical predominance. No new focal airspace opacities. No pleural effusion or pneumothorax. The remaining pulmonary airways are widely patent.  Upper abdomen: There is unchanged thickening of the left adrenal gland without discrete nodule. Otherwise, normal noncontrast appearance of the upper abdomen.  Musculoskeletal: No acute or aggressive osseous abnormalities mild scoliotic curvature of the thoracic spine, convex to the left. Normal appearance of the thyroid gland.  IMPRESSION: 1. No new pulmonary nodules. 2. The ill-defined mixed solid and ground-glass nodule within the superior segment of the left lower lobe has very minimally increased in size in the interval, currently measuring 1.5 x 0.6 cm, previously, 1.1 x 0.6 cm, however as this nodule did NOT demonstrate increased metabolic activity on PET scan performed 05/31/2015 and is located in an area of chronic atelectasis (present since at least the 03/2013 examination) and while favored to represent an area evolving scar, a follow-up chest CT in 6 months is recommended to ensure continued stability. 3. Remaining bilateral pulmonary nodules are unchanged with slight differences attributable to scan slice selection. 4. Similar findings of advanced mixed centrilobular and paraseptal emphysematous change without superimposed acute cardiopulmonary disease. 5. Atherosclerosis  including coronary artery calcifications.       Assessment & Plan:  Cancer of lower lobe of right lung Now released from oncology clinic and starting survivorship appointments  COPD (chronic obstructive pulmonary disease) Stable at this time on SABA prn. Will continue to follow, start LAMA in the future if she clinically changes.   Pulmonary nodule, left Slightly larger based on CT scan from 11/18/15, but this is in an area that looks like scar. Also note no hypermetabolism on PET from 11/16. I believe we can continue to follow it, make decision on bx based on interval change.

## 2015-11-27 NOTE — Patient Instructions (Signed)
Please continue to have your albuterol available to use as needed for shortness of breath.  If we find that you are using albuterol more, then we can consider starting you on a different inhaler that you would take every day.  We will repeat your CT chest in 6 months  Follow with Dr Lamonte Sakai in 6 months or sooner if you have any problems

## 2015-12-10 ENCOUNTER — Encounter: Payer: Self-pay | Admitting: Internal Medicine

## 2015-12-10 ENCOUNTER — Ambulatory Visit (INDEPENDENT_AMBULATORY_CARE_PROVIDER_SITE_OTHER): Payer: PPO | Admitting: Internal Medicine

## 2015-12-10 VITALS — BP 98/60 | HR 76 | Temp 97.6°F | Wt 119.0 lb

## 2015-12-10 DIAGNOSIS — W57XXXA Bitten or stung by nonvenomous insect and other nonvenomous arthropods, initial encounter: Secondary | ICD-10-CM | POA: Diagnosis not present

## 2015-12-10 DIAGNOSIS — S80862A Insect bite (nonvenomous), left lower leg, initial encounter: Secondary | ICD-10-CM | POA: Diagnosis not present

## 2015-12-10 NOTE — Progress Notes (Signed)
Pre visit review using our clinic review tool, if applicable. No additional management support is needed unless otherwise documented below in the visit note. 

## 2015-12-10 NOTE — Patient Instructions (Signed)
Tick Bite Information Ticks are insects that attach themselves to the skin and draw blood for food. There are various types of ticks. Common types include wood ticks and deer ticks. Most ticks live in shrubs and grassy areas. Ticks can climb onto your body when you make contact with leaves or grass where the tick is waiting. The most common places on the body for ticks to attach themselves are the scalp, neck, armpits, waist, and groin. Most tick bites are harmless, but sometimes ticks carry germs that cause diseases. These germs can be spread to a person during the tick's feeding process. The chance of a disease spreading through a tick bite depends on:   The type of tick.  Time of year.   How long the tick is attached.   Geographic location.  HOW CAN YOU PREVENT TICK BITES? Take these steps to help prevent tick bites when you are outdoors:  Wear protective clothing. Long sleeves and long pants are best.   Wear white clothes so you can see ticks more easily.  Tuck your pant legs into your socks.   If walking on a trail, stay in the middle of the trail to avoid brushing against bushes.  Avoid walking through areas with long grass.  Put insect repellent on all exposed skin and along boot tops, pant legs, and sleeve cuffs.   Check clothing, hair, and skin repeatedly and before going inside.   Brush off any ticks that are not attached.  Take a shower or bath as soon as possible after being outdoors.  WHAT IS THE PROPER WAY TO REMOVE A TICK? Ticks should be removed as soon as possible to help prevent diseases caused by tick bites. 1. If latex gloves are available, put them on before trying to remove a tick.  2. Using fine-point tweezers, grasp the tick as close to the skin as possible. You may also use curved forceps or a tick removal tool. Grasp the tick as close to its head as possible. Avoid grasping the tick on its body. 3. Pull gently with steady upward pressure until  the tick lets go. Do not twist the tick or jerk it suddenly. This may break off the tick's head or mouth parts. 4. Do not squeeze or crush the tick's body. This could force disease-carrying fluids from the tick into your body.  5. After the tick is removed, wash the bite area and your hands with soap and water or other disinfectant such as alcohol. 6. Apply a small amount of antiseptic cream or ointment to the bite site.  7. Wash and disinfect any instruments that were used.  Do not try to remove a tick by applying a hot match, petroleum jelly, or fingernail polish to the tick. These methods do not work and may increase the chances of disease being spread from the tick bite.  WHEN SHOULD YOU SEEK MEDICAL CARE? Contact your health care provider if you are unable to remove a tick from your skin or if a part of the tick breaks off and is stuck in the skin.  After a tick bite, you need to be aware of signs and symptoms that could be related to diseases spread by ticks. Contact your health care provider if you develop any of the following in the days or weeks after the tick bite:  Unexplained fever.  Rash. A circular rash that appears days or weeks after the tick bite may indicate the possibility of Lyme disease. The rash may resemble   a target with a bull's-eye and may occur at a different part of your body than the tick bite.  Redness and swelling in the area of the tick bite.   Tender, swollen lymph glands.   Diarrhea.   Weight loss.   Cough.   Fatigue.   Muscle, joint, or bone pain.   Abdominal pain.   Headache.   Lethargy or a change in your level of consciousness.  Difficulty walking or moving your legs.   Numbness in the legs.   Paralysis.  Shortness of breath.   Confusion.   Repeated vomiting.    This information is not intended to replace advice given to you by your health care provider. Make sure you discuss any questions you have with your health  care provider.   Document Released: 07/10/2000 Document Revised: 08/03/2014 Document Reviewed: 12/21/2012 Elsevier Interactive Patient Education 2016 [employer]  

## 2015-12-10 NOTE — Progress Notes (Signed)
Subjective:    Patient ID: [name], female    DOB: Jun 17, 1941, 75 y.o.   MRN: [id number]  HPI  Pt presents to the clinic today with c/o a tick bite to her left leg behind her knee. This occurred 2 days ago. Her niece tried to take it out but was unsuccessful. She denies fever, chills, rash, nausea, diarrhea or joint pains. She has put neosporin on the area.  Review of Systems      Past Medical History  Diagnosis Date  . HTN (hypertension)   . Arthritis   . Macular degeneration   . Myocardial infarction [facility]) 03/2009    Acute myocardial infarction 2010 - treated with BMS of LCx. LVEF 50% with subsequent CHF  . Lung cancer ([place]) dx'd 07/2006    Lung CA, s/p resection, followed by Dr Earlie Server  . History of CVA (cerebrovascular accident) without residual deficits 01/2012, 06/2012    R hemorrhagic MCA 01/2012 with remote lacunar infarct L putamen and IC, rpt 06/2012 acute multifocal R MCA infarct with remote hemorrhagic strokes affecting L basal ganglia and periventricular white matter, full recovery  . Emphysema/COPD ([place])   . GERD (gastroesophageal reflux disease)   . CAD (coronary artery disease) 03/2009    s/p MI  . HLD (hyperlipidemia)   . Ex-smoker quit 2008  . Chronic combined systolic and diastolic heart failure ([place]) 06/17/2009    Qualifier: Diagnosis of  By: Burt Knack, MD, Clayburn Pert   . Ischemic cardiomyopathy 02/27/2014  . Helicobacter pylori gastritis 10/2007    treated  . Osteoporosis 09/2013    T -3.6 forearm 09/2013, T -4.5 forearm 04/2015  . CKD (chronic kidney disease) stage 3, GFR 30-59 ml/min     Current Outpatient Prescriptions  Medication Sig Dispense Refill  . albuterol (PROAIR HFA) 108 (90 BASE) MCG/ACT inhaler Inhale 2 puffs into the lungs every 4 (four) hours as needed for wheezing or shortness of breath. 1 Inhaler 1  . aspirin 325 MG tablet Take 1 tablet (325 mg total) by mouth daily.    . Calcium Carbonate-Vitamin D (CALCIUM-VITAMIN D) 500-200 MG-UNIT  per tablet Take 2 tablets by mouth daily.    . carvedilol (COREG) 3.125 MG tablet TAKE 1 TABLET BY MOUTH TWICE A DAY WITH FOOD 60 tablet 10  . Cholecalciferol (VITAMIN D) 2000 UNITS CAPS Take 1 capsule by mouth daily.    Marland Kitchen denosumab (PROLIA) 60 MG/ML SOLN injection Inject 60 mg into the skin every 6 (six) months. Administer in upper arm, thigh, or abdomen 60 mL 1  . furosemide (LASIX) 40 MG tablet TAKE 1 TABLET BY MOUTH DAILY 30 tablet 11  . isosorbide mononitrate (IMDUR) 30 MG 24 hr tablet TAKE 1 TABLET BY MOUTH DAILY 30 tablet 3  . losartan (COZAAR) 50 MG tablet TAKE 1 TABLET BY MOUTH DAILY 30 tablet 11  . LOTEMAX 0.5 % ophthalmic suspension Place 1 drop into both eyes daily.     . meclizine (ANTIVERT) 25 MG tablet Take 1 tablet (25 mg total) by mouth 3 (three) times daily as needed for dizziness. 30 tablet 0  . nitroGLYCERIN (NITROSTAT) 0.4 MG SL tablet Place 1 tablet (0.4 mg total) under the tongue every 5 (five) minutes as needed for chest pain. 25 tablet 3  . omeprazole (PRILOSEC) 20 MG capsule TAKE ONE CAPSULE BY MOUTH DAILY 30 capsule 11  . simvastatin (ZOCOR) 40 MG tablet TAKE ONE TABLET BY MOUTH AT BEDTIME 30 tablet 11  . spironolactone (ALDACTONE) 25 MG tablet  TAKE 1/2  TABLET BY MOUTH ONCE DAILY. 30 tablet 6   No current facility-administered medications for this visit.    Allergies  Allergen Reactions  . Actonel [Risedronate Sodium] Other (See Comments)    Headache  . Amlodipine Other (See Comments)    Pedal edema  . Fosamax [Alendronate Sodium] Other (See Comments)    Unable to tolerate  . Lisinopril Cough  . Pravastatin Other (See Comments)    Constipation.  . Codeine Rash    Family History  Problem Relation Age of Onset  . CAD Mother 77    MI  . Breast cancer Sister   . CAD Father 13  . Stroke Mother   . Stroke Father   . Diabetes Sister   . Hypertension Mother   . Heart attack Mother     Social History   Social History  . Marital Status: Widowed     Spouse Name: N/A  . Number of Children: 1  . Years of Education: N/A   Occupational History  . retired     Insurance underwriter   Social History Main Topics  . Smoking status: Former Smoker -- 2.00 packs/day for 40 years    Types: Cigarettes    Quit date: 07/27/2006  . Smokeless tobacco: Never Used  . Alcohol Use: No  . Drug Use: No  . Sexual Activity: Not on file   Other Topics Concern  . Not on file   Social History Narrative   The patient is a widow and has one son and one dog.     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  She does not drink alcohol. 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   Ed: HS   Activity: no regular exercise   Diet: some water, some fruits/vegetables    Lives in a one story home.      Constitutional: Denies fever, malaise, fatigue, headache or abrupt weight changes.  Respiratory: Denies difficulty breathing, shortness of breath, cough or sputum production.   Cardiovascular: Denies chest pain, chest tightness, palpitations or swelling in the hands or feet.  Skin: Pt reports tick bite. Denies redness, rashes, or ulcercations.    No other specific complaints in a complete review of systems (except as listed in HPI above).  Objective:   Physical Exam  BP 98/60 mmHg  Pulse 76  Temp(Src) 97.6 F (36.4 C) (Oral)  Wt 119 lb (53.978 kg)  SpO2 97% Wt Readings from Last 3 Encounters:  12/10/15 119 lb (53.978 kg)  11/27/15 122 lb (55.339 kg)  11/22/15 119 lb (53.978 kg)    General: Appears her stated age, in NAD. Skin: Warm, dry and intact. Tick head embedded into the skin behind the left knee. Small area of induration but not warmth noted.   BMET    Component Value Date/Time   NA 137 10/17/2015 1546   NA 139 04/30/2014 1312   NA 134 04/06/2011 1312   K 3.9 10/17/2015 1546   K 4.0 04/30/2014 1312   K 4.5 04/06/2011 1312   CL 102 10/17/2015 1546   CL 102 04/18/2012 1057   CL 98 04/06/2011  1312   CO2 29 10/17/2015 1546   CO2 27 04/30/2014 1312   CO2 26 04/06/2011 1312   GLUCOSE 77 10/17/2015 1546   GLUCOSE 97 04/30/2014 1312   GLUCOSE 102* 04/18/2012 1057   GLUCOSE 100 04/06/2011 1312   BUN 13 10/17/2015  1546   BUN 20.7 04/30/2014 1312   BUN 20 04/06/2011 1312   CREATININE 0.81 10/17/2015 1546   CREATININE 1.06* 04/20/2015 1340   CREATININE 1.0 04/30/2014 1312   CALCIUM 8.5* 10/17/2015 1546   CALCIUM 9.7 04/30/2014 1312   CALCIUM 9.4 04/06/2011 1312   GFRNONAA 50* 04/20/2015 1340   GFRAA 58* 04/20/2015 1340    Lipid Panel     Component Value Date/Time   CHOL 140 03/28/2015 0817   TRIG 62.0 03/28/2015 0817   HDL 57.50 03/28/2015 0817   CHOLHDL 2 03/28/2015 0817   VLDL 12.4 03/28/2015 0817   LDLCALC 71 03/28/2015 0817    CBC    Component Value Date/Time   WBC 15.6* 04/20/2015 1340   WBC 6.8 04/30/2014 1311   RBC 4.21 04/20/2015 1340   RBC 4.11 04/30/2014 1311   HGB 13.6 04/20/2015 1340   HGB 12.9 04/30/2014 1311   HCT 39.2 04/20/2015 1340   HCT 39.3 04/30/2014 1311   PLT 264 04/20/2015 1340   PLT 213 04/30/2014 1311   MCV 93.1 04/20/2015 1340   MCV 95.5 04/30/2014 1311   MCH 32.3 04/20/2015 1340   MCH 31.5 04/30/2014 1311   MCHC 34.7 04/20/2015 1340   MCHC 32.9 04/30/2014 1311   RDW 13.1 04/20/2015 1340   RDW 13.0 04/30/2014 1311   LYMPHSABS 2.1 03/28/2015 0817   LYMPHSABS 2.5 04/30/2014 1311   MONOABS 0.6 03/28/2015 0817   MONOABS 0.6 04/30/2014 1311   EOSABS 0.8* 03/28/2015 0817   EOSABS 0.5 04/30/2014 1311   BASOSABS 0.1 03/28/2015 0817   BASOSABS 0.1 04/30/2014 1311    Hgb A1C Lab Results  Component Value Date   HGBA1C 6.1 03/28/2015         Assessment & Plan:   Tick bite of left lower leg:  Head removed using 18 g needle Advised her to wash with warm water and soap Cover with Neosporin Return precautions given  RTC as needed or if symptoms persist or worsen

## 2016-01-08 ENCOUNTER — Ambulatory Visit (INDEPENDENT_AMBULATORY_CARE_PROVIDER_SITE_OTHER): Payer: PPO

## 2016-01-08 VITALS — BP 110/80 | HR 58 | Temp 97.8°F | Ht 66.0 in | Wt 119.0 lb

## 2016-01-08 DIAGNOSIS — Z0000 Encounter for general adult medical examination without abnormal findings: Secondary | ICD-10-CM | POA: Diagnosis not present

## 2016-01-08 NOTE — Progress Notes (Signed)
Subjective:   [name] is a 75 y.o. female who presents for Medicare Annual (Subsequent) preventive examination.  Review of Systems:  N/A Cardiac Risk Factors include: advanced age (>55mn, >>63women);hypertension;dyslipidemia     Objective:     Vitals: BP 110/80 mmHg  Pulse 58  Temp(Src) 97.8 F (36.6 C) (Oral)  Ht '5\' 6"'$  (1.676 m)  Wt 119 lb (53.978 kg)  BMI 19.22 kg/m2  SpO2 96%  Body mass index is 19.22 kg/(m^2).   Tobacco History  Smoking status  . Former Smoker -- 2.00 packs/day for 40 years  . Types: Cigarettes  . Quit date: 07/27/2006  Smokeless tobacco  . Never Used     Counseling given: No   Past Medical History  Diagnosis Date  . HTN (hypertension)   . Arthritis   . Macular degeneration   . Myocardial infarction ([facility] 03/2009    Acute myocardial infarction 2010 - treated with BMS of LCx. LVEF 50% with subsequent CHF  . [place]    Lung CA, s/p resection, followed by Dr MEarlie Server . History of CVA (cerebrovascular accident) without residual deficits 01/2012, 06/2012    R hemorrhagic MCA 01/2012 with remote lacunar infarct L putamen and IC, rpt 06/2012 acute multifocal R MCA infarct with remote hemorrhagic strokes affecting L basal ganglia and periventricular white matter, full recovery  . [place]   . GERD (gastroesophageal reflux disease)   . CAD (coronary artery disease) 03/2009    s/p MI  . HLD (hyperlipidemia)   . Ex-smoker quit 2008  . Chronic combined systolic [place] diastolic heart failure [place] 06/17/2009    Qualifier: Diagnosis of  By: CBurt Knack MD, MClayburn Pert  . Ischemic cardiomyopathy 02/27/2014  . Helicobacter pylori gastritis 10/2007    treated  . Osteoporosis 09/2013    T -3.6 forearm 09/2013, T -4.5 forearm 04/2015  . CKD (chronic kidney disease) stage 3, GFR 30-59 ml/min    Past Surgical History  Procedure Laterality Date  . Lung removal, partial Right 2008  . Partial hysterectomy  1986   irregular periods, ovaries remain  . Breast biopsy Left   . Cataract extraction Bilateral    Family History  Problem Relation Age of Onset  . CAD Mother 682   MI  . Breast cancer Sister   . CAD Father 685 . Stroke Mother   . Stroke Father   . Diabetes Sister   . Hypertension Mother   . Heart attack Mother    History  Sexual Activity  . Sexual Activity: Not on file    Outpatient Encounter Prescriptions as of 01/08/2016  Medication Sig  . albuterol (PROAIR HFA) 108 (90 BASE) MCG/ACT inhaler Inhale 2 puffs into the lungs every 4 (four) hours as needed for wheezing or shortness of breath.  .Marland Kitchenaspirin 325 MG tablet Take 1 tablet (325 mg total) by mouth daily.  . Calcium Carbonate-Vitamin D (CALCIUM-VITAMIN D) 500-200 MG-UNIT per tablet Take 2 tablets by mouth daily.  . carvedilol (COREG) 3.125 MG tablet TAKE 1 TABLET BY MOUTH TWICE A DAY WITH FOOD  . Cholecalciferol (VITAMIN D) 2000 UNITS CAPS Take 1 capsule by mouth daily.  .Marland Kitchendenosumab (PROLIA) 60 MG/ML SOLN injection Inject 60 mg into the skin every 6 (six) months. Administer in upper arm, thigh, or abdomen  . furosemide (LASIX) 40 MG tablet TAKE 1 TABLET BY MOUTH DAILY  . isosorbide mononitrate (IMDUR) 30 MG 24 hr tablet TAKE 1 TABLET BY  MOUTH DAILY  . losartan (COZAAR) 50 MG tablet TAKE 1 TABLET BY MOUTH DAILY  . LOTEMAX 0.5 % ophthalmic suspension Place 1 drop into both eyes daily.   . meclizine (ANTIVERT) 25 MG tablet Take 1 tablet (25 mg total) by mouth 3 (three) times daily as needed for dizziness.  . nitroGLYCERIN (NITROSTAT) 0.4 MG SL tablet Place 1 tablet (0.4 mg total) under the tongue every 5 (five) minutes as needed for chest pain.  Marland Kitchen omeprazole (PRILOSEC) 20 MG capsule TAKE ONE CAPSULE BY MOUTH DAILY  . simvastatin (ZOCOR) 40 MG tablet TAKE ONE TABLET BY MOUTH AT BEDTIME  . spironolactone (ALDACTONE) 25 MG tablet TAKE 1/2  TABLET BY MOUTH ONCE DAILY.   No facility-administered encounter medications on file as of  01/08/2016.    Activities of Daily Living In your present state of health, do you have any difficulty performing the following activities: 01/08/2016  Hearing? N  Vision? N  Difficulty concentrating or making decisions? N  Walking or climbing stairs? N  Dressing or bathing? N  Doing errands, shopping? N  Preparing Food and eating ? N  Using the Toilet? N  In the past six months, have you accidently leaked urine? N  Do you have problems with loss of bowel control? N  Managing your Medications? N  Managing your Finances? N  Housekeeping or managing your Housekeeping? N    Patient Care Team: Ria Bush, MD as PCP - General (Family Medicine)    Assessment:     Hearing Screening   '125Hz'$  '250Hz'$  '500Hz'$  '1000Hz'$  '2000Hz'$  '4000Hz'$  '8000Hz'$   Right ear:   40 40 40 40   Left ear:   0 0 40 40   Vision Screening Comments: Last eye exam in Jan 2017; sees eye doc every 6 mths   Exercise Activities and Dietary recommendations Current Exercise Habits: Home exercise routine, Type of exercise: walking, Time (Minutes): 30, Frequency (Times/Week): 7, Weekly Exercise (Minutes/Week): 210, Intensity: Moderate, Exercise limited by: None identified  Goals    . Increase physical activity     Starting 01/08/2016, I will continue to walk for 30 min daily.       Fall Risk Fall Risk  01/08/2016 11/14/2015 04/08/2015 05/15/2014  Falls in the past year? No No No No   Depression Screen PHQ 2/9 Scores 01/08/2016 04/08/2015  PHQ - 2 Score 0 0     Cognitive Testing MMSE - Mini Mental State Exam 01/08/2016  Orientation to time 5  Orientation to Place 5  Registration 3  Attention/ Calculation 0  Recall 3  Language- name 2 objects 0  Language- repeat 1  Language- follow 3 step command 3  Language- read & follow direction 0  Write a sentence 0  Copy design 0  Total score 20   PLEASE NOTE: A Mini-Cog screen was completed. Maximum score is 20. A value of 0 denotes this part of [place]stein MMSE was not completed  or the patient failed this part of the Mini-Cog screening.   Mini-Cog Screening Orientation to Time - Max 5 pts Orientation to Place - Max 5 pts Registration - Max 3 pts Recall - Max 3 pts Language Repeat - Max 1 pts Language Follow 3 Step Command - Max 3 pts  Immunization History  Administered Date(s) Administered  . Influenza Split 03/29/2012, 05/08/2013  . Influenza,inj,Quad PF,36+ Mos 02/24/2014, 04/08/2015  . Pneumococcal Conjugate-13 05/08/2015  . Pneumococcal Polysaccharide-23 07/27/2009  . Zoster 07/28/2011   Screening Tests Health Maintenance  Topic Date Due  .  COLONOSCOPY  01/07/2026 (Originally 12/10/1990)  . INFLUENZA VACCINE  02/25/2016  . COLON CANCER SCREENING ANNUAL FOBT  04/25/2016  . TETANUS/TDAP  07/08/2022  . DTaP/Tdap/Td  Completed  . DEXA SCAN  Completed  . ZOSTAVAX  Completed  . PNA vac Low Risk Adult  Completed      Plan:     I have personally reviewed and addressed the Medicare Annual Wellness questionnaire and have noted the following in the patient's chart:  A. Medical and social history B. Use of alcohol, tobacco or illicit drugs  C. Current medications and supplements D. Functional ability and status E.  Nutritional status F.  Physical activity G. Advance directives H. List of other physicians I.  Hospitalizations, surgeries, and ER visits in previous 12 months J.  [place] to include hearing, vision, cognitive, depression L. Referrals and appointments - none  In addition, I have reviewed and discussed with patient certain preventive protocols, quality metrics, and best practice recommendations. A written personalized care plan for preventive services as well as general preventive health recommendations were provided to patient.  See attached scanned questionnaire for additional information.   Signed,   Lindell Noe, MHA, BS, LPN Health Advisor

## 2016-01-08 NOTE — Patient Instructions (Signed)
Ms. Petko , Thank you for taking time to come for your Medicare Wellness Visit. I appreciate your ongoing commitment to your health goals. Please review the following plan we discussed and let me know if I can assist you in the future.   These are the goals we discussed: Goals    . Increase physical activity     Starting 01/08/2016, I will continue to walk for 30 min daily.        This is a list of the screening recommended for you and due dates:  Health Maintenance  Topic Date Due  . Colon Cancer Screening  01/07/2026*  . Flu Shot  02/25/2016  . Stool Blood Test  04/25/2016  . Tetanus Vaccine  07/08/2022  . DTaP/Tdap/Td vaccine  Completed  . DEXA scan (bone density measurement)  Completed  . Shingles Vaccine  Completed  . Pneumonia vaccines  Completed  *Topic was postponed. The date shown is not the original due date.    Preventive Care for Adults  A healthy lifestyle and preventive care can promote health and wellness. Preventive health guidelines for adults include the following key practices.  . A routine yearly physical is a good way to check with your health care provider about your health and preventive screening. It is a chance to share any concerns and updates on your health and to receive a thorough exam.  . Visit your dentist for a routine exam and preventive care every 6 months. Brush your teeth twice a day and floss once a day. Good oral hygiene prevents tooth decay and gum disease.  . The frequency of eye exams is based on your age, health, family medical history, use  of contact lenses, and other factors. Follow your health care provider's ecommendations for frequency of eye exams.  . Eat a healthy diet. Foods like vegetables, fruits, whole grains, low-fat dairy products, and lean protein foods contain the nutrients you need without too many calories. Decrease your intake of foods high in solid fats, added sugars, and salt. Eat the right amount of calories for you.  Get information about a proper diet from your health care provider, if necessary.  . Regular physical exercise is one of the most important things you can do for your health. Most adults should get at least 150 minutes of moderate-intensity exercise (any activity that increases your heart rate and causes you to sweat) each week. In addition, most adults need muscle-strengthening exercises on 2 or more days a week.  Silver Sneakers may be a benefit available to you. To determine eligibility, you may visit the website: www.silversneakers.com or contact program at 727-146-7984 Mon-Fri between 8AM-8PM.   . Maintain a healthy weight. The body mass index (BMI) is a screening tool to identify possible weight problems. It provides an estimate of body fat based on height and weight. Your health care provider can find your BMI and can help you achieve or maintain a healthy weight.   For adults 20 years and older: ? A BMI below 18.5 is considered underweight. ? A BMI of 18.5 to 24.9 is normal. ? A BMI of 25 to 29.9 is considered overweight. ? A BMI of 30 and above is considered obese.   . Maintain normal blood lipids and cholesterol levels by exercising and minimizing your intake of saturated fat. Eat a balanced diet with plenty of fruit and vegetables. Blood tests for lipids and cholesterol should begin at age 51 and be repeated every 5 years. If your lipid  or cholesterol levels are high, you are over 50, or you are at high risk for heart disease, you may need your cholesterol levels checked more frequently. Ongoing high lipid and cholesterol levels should be treated with medicines if diet and exercise are not working.  . If you smoke, find out from your health care provider how to quit. If you do not use tobacco, please do not start.  . If you choose to drink alcohol, please do not consume more than 2 drinks per day. One drink is considered to be 12 ounces (355 mL) of beer, 5 ounces (148 mL) of wine, or  1.5 ounces (44 mL) of liquor.  . If you are 69-60 years old, ask your health care provider if you should take aspirin to prevent strokes.  . Use sunscreen. Apply sunscreen liberally and repeatedly throughout the day. You should seek shade when your shadow is shorter than you. Protect yourself by wearing long sleeves, pants, a wide-brimmed hat, and sunglasses year round, whenever you are outdoors.  . Once a month, do a whole body skin exam, using a mirror to look at the skin on your back. Tell your health care provider of new moles, moles that have irregular borders, moles that are larger than a pencil eraser, or moles that have changed in shape or color.

## 2016-01-08 NOTE — Progress Notes (Signed)
PCP notes:  Health maintenance: No gaps identified or addressed. Note: pt was given a fecal occult test kit to complete in 03/2016.  Abnormal screenings:   Hearing -failed.  Patient concerns: None  Nurse concerns: None  Next PCP appt: None; pt declined a CPE at this time  I reviewed health advisor's note, was available for consultation on the day of service listed in this note, and agree with documentation and plan. Routed to PCP as FYI.  Elsie Stain, MD.

## 2016-01-08 NOTE — Progress Notes (Signed)
Pre visit review using our clinic review tool, if applicable. No additional management support is needed unless otherwise documented below in the visit note. 

## 2016-02-07 ENCOUNTER — Ambulatory Visit (INDEPENDENT_AMBULATORY_CARE_PROVIDER_SITE_OTHER): Payer: PPO | Admitting: Neurology

## 2016-02-07 ENCOUNTER — Encounter: Payer: Self-pay | Admitting: Neurology

## 2016-02-07 VITALS — BP 104/68 | HR 64 | Ht 66.0 in | Wt 116.1 lb

## 2016-02-07 DIAGNOSIS — G459 Transient cerebral ischemic attack, unspecified: Secondary | ICD-10-CM | POA: Diagnosis not present

## 2016-02-07 NOTE — Progress Notes (Signed)
Follow-up Visit   Date: 02/07/2016    [name] MRN: [id number] DOB: 29-Aug-1940   Interim History: [name] is a 75 y.o. emale with hypertension, history of RMA stroke (2013), CKD stage III, heart failure, non-small cell lung cancer s/p resection returning to the clinic for follow-up of TIA.  The patient was accompanied to the clinic by self.  History of present illness: On 10/15/2015, she woke up with left arm numbness at 3am, which involved her entire left arm. Left face and leg was not involved. There was no associated weakness. Because she was unsure if this was due to stroke or cardiac reasons, she took three nitroglycerin tablets and it slowly improved within 30-45 minutes. She then went back to sleep and did not have any other spells. The following morning, she called to schedule an appointment with her cardiologist who then referred her to neurology because of her prior history of RMCA stroke in 2013. In 2013, her stroke manifested with left hemiplegia, facial weakness, and dysarthria and was found to have multifocal R MCA territory infarcts. Vessel imaging did not show any stenosis or occlusion of the head or neck. She was continued on aspirin therapy, which she has been complaint with. She does not have any residual deficits.   UPDATE 02/07/2016:  She did not have CT head or US carotids which was recommended at her last visit.  She denies having any further spells of left arm numbness.  She denies any new neurological complaints.    Medications:  Current Outpatient Prescriptions on File Prior to Visit  Medication Sig Dispense Refill  . albuterol (PROAIR HFA) 108 (90 BASE) MCG/ACT inhaler Inhale 2 puffs into the lungs every 4 (four) hours as needed for wheezing or shortness of breath. 1 Inhaler 1  . aspirin 325 MG tablet Take 1 tablet (325 mg total) by mouth daily.    . Calcium Carbonate-Vitamin D (CALCIUM-VITAMIN D) 500-200 MG-UNIT per tablet Take 2 tablets by  mouth daily.    . carvedilol (COREG) 3.125 MG tablet TAKE 1 TABLET BY MOUTH TWICE A DAY WITH FOOD 60 tablet 10  . Cholecalciferol (VITAMIN D) 2000 UNITS CAPS Take 1 capsule by mouth daily.    Marland Kitchen denosumab (PROLIA) 60 MG/ML SOLN injection Inject 60 mg into the skin every 6 (six) months. Administer in upper arm, thigh, or abdomen 60 mL 1  . furosemide (LASIX) 40 MG tablet TAKE 1 TABLET BY MOUTH DAILY 30 tablet 11  . isosorbide mononitrate (IMDUR) 30 MG 24 hr tablet TAKE 1 TABLET BY MOUTH DAILY 30 tablet 3  . losartan (COZAAR) 50 MG tablet TAKE 1 TABLET BY MOUTH DAILY 30 tablet 11  . LOTEMAX 0.5 % ophthalmic suspension Place 1 drop into both eyes daily.     . meclizine (ANTIVERT) 25 MG tablet Take 1 tablet (25 mg total) by mouth 3 (three) times daily as needed for dizziness. 30 tablet 0  . nitroGLYCERIN (NITROSTAT) 0.4 MG SL tablet Place 1 tablet (0.4 mg total) under the tongue every 5 (five) minutes as needed for chest pain. 25 tablet 3  . omeprazole (PRILOSEC) 20 MG capsule TAKE ONE CAPSULE BY MOUTH DAILY 30 capsule 11  . simvastatin (ZOCOR) 40 MG tablet TAKE ONE TABLET BY MOUTH AT BEDTIME 30 tablet 11  . spironolactone (ALDACTONE) 25 MG tablet TAKE 1/2  TABLET BY MOUTH ONCE DAILY. 30 tablet 6   No current facility-administered medications on file prior to visit.    Allergies:  Allergies  Allergen Reactions  . Actonel [Risedronate Sodium] Other (See Comments)    Headache  . Amlodipine Other (See Comments)    Pedal edema  . Fosamax [Alendronate Sodium] Other (See Comments)    Unable to tolerate  . Lisinopril Cough  . Pravastatin Other (See Comments)    Constipation.  . Codeine Rash    Review of Systems:  CONSTITUTIONAL: No fevers, chills, night sweats, or weight loss.  EYES: No visual changes or eye pain ENT: No hearing changes.  No history of nose bleeds.   RESPIRATORY: No cough, wheezing and shortness of breath.   CARDIOVASCULAR: Negative for chest pain, and palpitations.   GI:  Negative for abdominal discomfort, blood in stools or black stools.  No recent change in bowel habits.   GU:  No history of incontinence.   MUSCLOSKELETAL: No history of joint pain or swelling.  No myalgias.   SKIN: Negative for lesions, rash, and itching.   ENDOCRINE: Negative for cold or heat intolerance, polydipsia or goiter.   PSYCH:  No depression or anxiety symptoms.   NEURO: As Above.   Vital Signs:  BP 104/68 mmHg  Pulse 64  Ht '5\' 6"'$  (1.676 m)  Wt 116 lb 2 oz (52.674 kg)  BMI 18.75 kg/m2  SpO2 95%  Neurological Exam: MENTAL STATUS including orientation to time, place, person, recent and remote memory, attention span and concentration, language, and fund of knowledge is normal.  Speech is not dysarthric.  CRANIAL NERVES:  Pupils equal round and reactive to light.  Normal conjugate, extra-ocular eye movements in all directions of gaze.  No ptosis. Normal facial sensation.  Face is symmetric. Palate elevates symmetrically.  Tongue is midline.  MOTOR:  Motor strength is 5/5 in all extremities.  No atrophy, fasciculations or abnormal movements.  No pronator drift.  Tone is normal.    MSRs:  Right                                                                 Left brachioradialis 2+  brachioradialis 3+  biceps 2+  biceps 3+  triceps 2+  triceps 3+  patellar 2+  patellar 3+  ankle jerk 2+  ankle jerk 2+  Hoffman no  Hoffman no  plantar response down  plantar response up   SENSORY:  Intact to vibration and temperature.  COORDINATION/GAIT:   Gait narrow based and stable.   Data: MRI brain wo contrast 04/20/2015:  1. No acute intracranial abnormality. 2. Chronically advanced ischemic disease. Progressed right MCA infarcts since 2013.  MRI brain wo contrast 07/09/2012: Multifocal areas of acute infarction, right MCA territory. These are new from the prior study 02/18/2012.  Chronic changes as described. MRA head 07/09/2012: Mild irregularity distal left vertebral is  noted. This is non flow reducing. Otherwise unremarkable MRA. No significant change from priors.   IMPRESSION/PLAN: 1.  TIA manifesting with left arm numbness in March 2017, no residual deficits or new neurological events  - Patient declined to have additional work-up for etiology  - Continue secondary stroke prevention with  aspirin '325mg'$  daily, zocor '40mg'$ , and BP medications  2.  History of RMCA territory infarct (2013). Etiology for her last stroke is unclear as there was no large vessel stenosis/occlusion, arrhythmia, or embolic source.  2.  Return to clinic as needed  The duration of this appointment visit was 15 minutes of face-to-face time with the patient.  Greater than 50% of this time was spent in counseling, explanation of diagnosis, planning of further management, and coordination of care.   Thank you for allowing me to participate in patient's care.  If I can answer any additional questions, I would be pleased to do so.    Sincerely,    Donika K. Posey Pronto, DO

## 2016-02-07 NOTE — Patient Instructions (Signed)
Return to clinic as needed  You will always be at an increased risk of stroke.  Stroke is a medical emergency.  Know these warning signs of stroke. Every second counts:  Sudden numbness or weakness of the face, arm or leg, especially on one side of the body,  Sudden confusion, trouble speaking or understanding,  Sudden trouble seeing in one eye or both eyes,  Sudden trouble walking, dizziness, loss of balance or coordination,  Sudden severe headache with no known cause.  If you or someone with you has one or more of these signs, don't delay!  Immediately call [id number], or the emergency medical services (EMS) number so an ambulance (ideally with advanced life support) can be sent for you.  Also, check the time so that you will know when the symptoms first appeared. It's very important to take immediate action. Medical treatment may be available if action is taken early enough.

## 2016-02-11 ENCOUNTER — Other Ambulatory Visit: Payer: Self-pay | Admitting: Family Medicine

## 2016-02-11 DIAGNOSIS — Z1231 Encounter for screening mammogram for malignant neoplasm of breast: Secondary | ICD-10-CM

## 2016-02-24 ENCOUNTER — Encounter (HOSPITAL_COMMUNITY)
Admission: EM | Disposition: A | Discharge status: Home / Self Care | Payer: Self-pay | Source: Home / Self Care | Attending: Interventional Cardiology

## 2016-02-24 ENCOUNTER — Encounter (HOSPITAL_COMMUNITY): Payer: Self-pay | Admitting: Certified Registered Nurse Anesthetist

## 2016-02-24 ENCOUNTER — Inpatient Hospital Stay (HOSPITAL_COMMUNITY)
Admission: EM | Admit: 2016-02-24 | Discharge: 2016-02-26 | DRG: 281 | Disposition: A | Discharge status: Home / Self Care | Payer: PPO | Attending: Interventional Cardiology | Admitting: Interventional Cardiology

## 2016-02-24 DIAGNOSIS — J441 Chronic obstructive pulmonary disease with (acute) exacerbation: Secondary | ICD-10-CM | POA: Diagnosis present

## 2016-02-24 DIAGNOSIS — Z885 Allergy status to narcotic agent status: Secondary | ICD-10-CM | POA: Diagnosis not present

## 2016-02-24 DIAGNOSIS — I2109 ST elevation (STEMI) myocardial infarction involving other coronary artery of anterior wall: Secondary | ICD-10-CM | POA: Diagnosis not present

## 2016-02-24 DIAGNOSIS — I130 Hypertensive heart and chronic kidney disease with heart failure and stage 1 through stage 4 chronic kidney disease, or unspecified chronic kidney disease: Secondary | ICD-10-CM | POA: Diagnosis not present

## 2016-02-24 DIAGNOSIS — I2510 Atherosclerotic heart disease of native coronary artery without angina pectoris: Secondary | ICD-10-CM | POA: Diagnosis present

## 2016-02-24 DIAGNOSIS — R008 Other abnormalities of heart beat: Secondary | ICD-10-CM | POA: Diagnosis not present

## 2016-02-24 DIAGNOSIS — I252 Old myocardial infarction: Secondary | ICD-10-CM

## 2016-02-24 DIAGNOSIS — Z888 Allergy status to other drugs, medicaments and biological substances status: Secondary | ICD-10-CM

## 2016-02-24 DIAGNOSIS — I213 ST elevation (STEMI) myocardial infarction of unspecified site: Secondary | ICD-10-CM | POA: Diagnosis not present

## 2016-02-24 DIAGNOSIS — I249 Acute ischemic heart disease, unspecified: Secondary | ICD-10-CM | POA: Diagnosis not present

## 2016-02-24 DIAGNOSIS — N183 Chronic kidney disease, stage 3 unspecified: Secondary | ICD-10-CM | POA: Diagnosis present

## 2016-02-24 DIAGNOSIS — M810 Age-related osteoporosis without current pathological fracture: Secondary | ICD-10-CM | POA: Diagnosis present

## 2016-02-24 DIAGNOSIS — D649 Anemia, unspecified: Secondary | ICD-10-CM | POA: Diagnosis not present

## 2016-02-24 DIAGNOSIS — Z8673 Personal history of transient ischemic attack (TIA), and cerebral infarction without residual deficits: Secondary | ICD-10-CM | POA: Diagnosis not present

## 2016-02-24 DIAGNOSIS — Z87891 Personal history of nicotine dependence: Secondary | ICD-10-CM

## 2016-02-24 DIAGNOSIS — Z7982 Long term (current) use of aspirin: Secondary | ICD-10-CM | POA: Diagnosis not present

## 2016-02-24 DIAGNOSIS — Z85118 Personal history of other malignant neoplasm of bronchus and lung: Secondary | ICD-10-CM

## 2016-02-24 DIAGNOSIS — H3530 Unspecified macular degeneration: Secondary | ICD-10-CM | POA: Diagnosis present

## 2016-02-24 DIAGNOSIS — R079 Chest pain, unspecified: Secondary | ICD-10-CM | POA: Diagnosis not present

## 2016-02-24 DIAGNOSIS — I25119 Atherosclerotic heart disease of native coronary artery with unspecified angina pectoris: Secondary | ICD-10-CM | POA: Diagnosis not present

## 2016-02-24 DIAGNOSIS — E785 Hyperlipidemia, unspecified: Secondary | ICD-10-CM | POA: Diagnosis present

## 2016-02-24 DIAGNOSIS — K219 Gastro-esophageal reflux disease without esophagitis: Secondary | ICD-10-CM | POA: Diagnosis present

## 2016-02-24 DIAGNOSIS — I255 Ischemic cardiomyopathy: Secondary | ICD-10-CM | POA: Diagnosis present

## 2016-02-24 DIAGNOSIS — J449 Chronic obstructive pulmonary disease, unspecified: Secondary | ICD-10-CM | POA: Diagnosis not present

## 2016-02-24 DIAGNOSIS — Z955 Presence of coronary angioplasty implant and graft: Secondary | ICD-10-CM

## 2016-02-24 DIAGNOSIS — Z79899 Other long term (current) drug therapy: Secondary | ICD-10-CM | POA: Diagnosis not present

## 2016-02-24 DIAGNOSIS — I5042 Chronic combined systolic (congestive) and diastolic (congestive) heart failure: Secondary | ICD-10-CM | POA: Diagnosis present

## 2016-02-24 HISTORY — PX: CARDIAC CATHETERIZATION: SHX172

## 2016-02-24 LAB — BASIC METABOLIC PANEL
Anion gap: 4 — ABNORMAL LOW (ref 5–15)
BUN: 14 mg/dL (ref 6–20)
CHLORIDE: 106 mmol/L (ref 101–111)
CO2: 25 mmol/L (ref 22–32)
Calcium: 9 mg/dL (ref 8.9–10.3)
Creatinine, Ser: 0.95 mg/dL (ref 0.44–1.00)
GFR calc non Af Amer: 57 mL/min — ABNORMAL LOW (ref >60)
Glucose, Bld: 169 mg/dL — ABNORMAL HIGH (ref 65–99)
POTASSIUM: 4.2 mmol/L (ref 3.5–5.1)
SODIUM: 135 mmol/L (ref 135–145)

## 2016-02-24 LAB — POCT I-STAT, CHEM 8
BUN: 18 mg/dL (ref 6–20)
CALCIUM ION: 1.34 mmol/L — AB (ref 1.12–1.23)
CHLORIDE: 98 mmol/L — AB (ref 101–111)
CREATININE: 0.9 mg/dL (ref 0.44–1.00)
Glucose, Bld: 142 mg/dL — ABNORMAL HIGH (ref 65–99)
HEMATOCRIT: 35 % — AB (ref 36.0–46.0)
Hemoglobin: 11.9 g/dL — ABNORMAL LOW (ref 12.0–15.0)
Potassium: 3.4 mmol/L — ABNORMAL LOW (ref 3.5–5.1)
SODIUM: 137 mmol/L (ref 135–145)
TCO2: 30 mmol/L (ref 0–100)

## 2016-02-24 LAB — CK TOTAL AND CKMB (NOT AT ARMC)
CK TOTAL: 52 U/L (ref 38–234)
CK, MB: 3.5 ng/mL (ref 0.5–5.0)
Relative Index: INVALID (ref 0.0–2.5)

## 2016-02-24 LAB — COMPREHENSIVE METABOLIC PANEL
ALBUMIN: 3 g/dL — AB (ref 3.5–5.0)
ALBUMIN: 3.3 g/dL — AB (ref 3.5–5.0)
ALK PHOS: 60 U/L (ref 38–126)
ALT: 12 U/L — AB (ref 14–54)
ALT: 10 U/L — ABNORMAL LOW (ref 14–54)
ANION GAP: 8 (ref 5–15)
AST: 17 U/L (ref 15–41)
AST: 18 U/L (ref 15–41)
Alkaline Phosphatase: 53 U/L (ref 38–126)
Anion gap: 10 (ref 5–15)
BUN: 15 mg/dL (ref 6–20)
BUN: 16 mg/dL (ref 6–20)
CALCIUM: 9.6 mg/dL (ref 8.9–10.3)
CHLORIDE: 101 mmol/L (ref 101–111)
CO2: 26 mmol/L (ref 22–32)
CO2: 27 mmol/L (ref 22–32)
CREATININE: 0.96 mg/dL (ref 0.44–1.00)
Calcium: 9.3 mg/dL (ref 8.9–10.3)
Chloride: 102 mmol/L (ref 101–111)
Creatinine, Ser: 0.95 mg/dL (ref 0.44–1.00)
GFR calc Af Amer: >60 mL/min (ref >60)
GFR calc non Af Amer: 56 mL/min — ABNORMAL LOW (ref >60)
GFR calc non Af Amer: 57 mL/min — ABNORMAL LOW (ref >60)
GLUCOSE: 125 mg/dL — AB (ref 65–99)
GLUCOSE: 144 mg/dL — AB (ref 65–99)
POTASSIUM: 3.4 mmol/L — AB (ref 3.5–5.1)
Potassium: 3.8 mmol/L (ref 3.5–5.1)
SODIUM: 138 mmol/L (ref 135–145)
Sodium: 136 mmol/L (ref 135–145)
Total Bilirubin: 0.4 mg/dL (ref 0.3–1.2)
Total Bilirubin: 0.5 mg/dL (ref 0.3–1.2)
Total Protein: 6.3 g/dL — ABNORMAL LOW (ref 6.5–8.1)
Total Protein: 5.8 g/dL — ABNORMAL LOW (ref 6.5–8.1)

## 2016-02-24 LAB — CBC
HCT: 34.9 % — ABNORMAL LOW (ref 36.0–46.0)
HEMATOCRIT: 37.8 % (ref 36.0–46.0)
HEMATOCRIT: 36.1 % (ref 36.0–46.0)
HEMOGLOBIN: 11.7 g/dL — AB (ref 12.0–15.0)
Hemoglobin: 12.4 g/dL (ref 12.0–15.0)
Hemoglobin: 11.4 g/dL — ABNORMAL LOW (ref 12.0–15.0)
MCH: 30.8 pg (ref 26.0–34.0)
MCH: 30.8 pg (ref 26.0–34.0)
MCH: 31 pg (ref 26.0–34.0)
MCHC: 32.7 g/dL (ref 30.0–36.0)
MCHC: 32.8 g/dL (ref 30.0–36.0)
MCHC: 32.4 g/dL (ref 30.0–36.0)
MCV: 93.8 fL (ref 78.0–100.0)
MCV: 94.3 fL (ref 78.0–100.0)
MCV: 95.8 fL (ref 78.0–100.0)
PLATELETS: 264 10*3/uL (ref 150–400)
Platelets: 309 10*3/uL (ref 150–400)
Platelets: 241 10*3/uL (ref 150–400)
RBC: 3.7 MIL/uL — AB (ref 3.87–5.11)
RBC: 4.03 MIL/uL (ref 3.87–5.11)
RBC: 3.77 MIL/uL — AB (ref 3.87–5.11)
RDW: 12.8 % (ref 11.5–15.5)
RDW: 12.9 % (ref 11.5–15.5)
RDW: 13 % (ref 11.5–15.5)
WBC: 8 10*3/uL (ref 4.0–10.5)
WBC: 8.2 10*3/uL (ref 4.0–10.5)
WBC: 7.2 10*3/uL (ref 4.0–10.5)

## 2016-02-24 LAB — PROTIME-INR
INR: 1.04
INR: 1.26
PROTHROMBIN TIME: 15.9 s — AB (ref 11.4–15.2)
Prothrombin Time: 13.6 seconds (ref 11.4–15.2)

## 2016-02-24 LAB — DIFFERENTIAL
BASOS PCT: 1 %
Basophils Absolute: 0.1 10*3/uL (ref 0.0–0.1)
Eosinophils Absolute: 1.1 10*3/uL — ABNORMAL HIGH (ref 0.0–0.7)
Eosinophils Relative: 13 %
LYMPHS ABS: 3.3 10*3/uL (ref 0.7–4.0)
Lymphocytes Relative: 39 %
MONO ABS: 0.5 10*3/uL (ref 0.1–1.0)
MONOS PCT: 7 %
NEUTROS ABS: 3.3 10*3/uL (ref 1.7–7.7)
Neutrophils Relative %: 40 %

## 2016-02-24 LAB — POCT ACTIVATED CLOTTING TIME
ACTIVATED CLOTTING TIME: 158 s
Activated Clotting Time: 197 seconds

## 2016-02-24 LAB — LIPID PANEL
CHOL/HDL RATIO: 2.4 ratio
CHOL/HDL RATIO: 2.4 ratio
CHOLESTEROL: 99 mg/dL (ref 0–200)
Cholesterol: 113 mg/dL (ref 0–200)
HDL: 42 mg/dL (ref >40)
HDL: 48 mg/dL (ref >40)
LDL CALC: 49 mg/dL (ref 0–99)
LDL Cholesterol: 47 mg/dL (ref 0–99)
Triglycerides: 80 mg/dL (ref <150)
Triglycerides: 48 mg/dL (ref <150)
VLDL: 10 mg/dL (ref 0–40)
VLDL: 16 mg/dL (ref 0–40)

## 2016-02-24 LAB — TROPONIN I: Troponin I: 0.05 ng/mL (ref <0.03)

## 2016-02-24 LAB — GLUCOSE, CAPILLARY: Glucose-Capillary: 135 mg/dL — ABNORMAL HIGH (ref 65–99)

## 2016-02-24 LAB — MRSA PCR SCREENING: MRSA BY PCR: NEGATIVE

## 2016-02-24 LAB — APTT
aPTT: 33 seconds (ref 24–36)
aPTT: >200 seconds (ref 24–36)

## 2016-02-24 SURGERY — LEFT HEART CATH AND CORONARY ANGIOGRAPHY
Anesthesia: Moderate Sedation

## 2016-02-24 MED ORDER — ACETAMINOPHEN 325 MG PO TABS: 650.0000 mg | ORAL_TABLET | ORAL | Status: DC | PRN

## 2016-02-24 MED ORDER — LOSARTAN POTASSIUM 50 MG PO TABS
50.0000 mg | ORAL_TABLET | Freq: Every day | ORAL | Status: DC
  Administered 2016-02-24 – 2016-02-26 (×3): 50 mg via ORAL
  Filled 2016-02-24 (×3): qty 1

## 2016-02-24 MED ORDER — HEPARIN (PORCINE) IN NACL 2-0.9 UNIT/ML-% IJ SOLN
INTRAMUSCULAR | Status: DC | PRN
  Administered 2016-02-24: 1500 mL

## 2016-02-24 MED ORDER — NITROGLYCERIN 0.4 MG SL SUBL: 0.4000 mg | SUBLINGUAL_TABLET | SUBLINGUAL | Status: DC | PRN

## 2016-02-24 MED ORDER — MIDAZOLAM HCL 2 MG/2ML IJ SOLN
INTRAMUSCULAR | Status: AC
  Filled 2016-02-24: qty 2

## 2016-02-24 MED ORDER — PANTOPRAZOLE SODIUM 40 MG PO TBEC
40.0000 mg | DELAYED_RELEASE_TABLET | Freq: Every day | ORAL | Status: DC
  Administered 2016-02-24 – 2016-02-26 (×3): 40 mg via ORAL
  Filled 2016-02-24 (×3): qty 1

## 2016-02-24 MED ORDER — SODIUM CHLORIDE 0.9 % WEIGHT BASED INFUSION: 3.0000 mL/kg/h | INTRAVENOUS | Status: AC

## 2016-02-24 MED ORDER — FENTANYL CITRATE (PF) 100 MCG/2ML IJ SOLN
INTRAMUSCULAR | Status: AC
  Filled 2016-02-24: qty 2

## 2016-02-24 MED ORDER — ASPIRIN 81 MG PO CHEW
81.0000 mg | CHEWABLE_TABLET | Freq: Every day | ORAL | Status: DC
  Administered 2016-02-24 – 2016-02-26 (×3): 81 mg via ORAL
  Filled 2016-02-24 (×3): qty 1

## 2016-02-24 MED ORDER — FUROSEMIDE 40 MG PO TABS
40.0000 mg | ORAL_TABLET | Freq: Every day | ORAL | Status: DC
Start: 2016-02-25 — End: 2016-02-26
  Administered 2016-02-25 – 2016-02-26 (×2): 40 mg via ORAL
  Filled 2016-02-24 (×2): qty 1

## 2016-02-24 MED ORDER — LIDOCAINE HCL (PF) 1 % IJ SOLN
INTRAMUSCULAR | Status: AC
  Filled 2016-02-24: qty 30

## 2016-02-24 MED ORDER — ISOSORBIDE MONONITRATE ER 30 MG PO TB24
30.0000 mg | ORAL_TABLET | Freq: Every day | ORAL | Status: DC
  Administered 2016-02-24 – 2016-02-26 (×3): 30 mg via ORAL
  Filled 2016-02-24 (×3): qty 1

## 2016-02-24 MED ORDER — CARVEDILOL 3.125 MG PO TABS
3.1250 mg | ORAL_TABLET | Freq: Two times a day (BID) | ORAL | Status: DC
  Administered 2016-02-24: 3.125 mg via ORAL
  Filled 2016-02-24: qty 1

## 2016-02-24 MED ORDER — SPIRONOLACTONE 25 MG PO TABS
12.5000 mg | ORAL_TABLET | Freq: Every day | ORAL | Status: DC
Start: 2016-02-25 — End: 2016-02-26
  Administered 2016-02-25 – 2016-02-26 (×2): 12.5 mg via ORAL
  Filled 2016-02-24 (×2): qty 1

## 2016-02-24 MED ORDER — ALBUTEROL SULFATE (2.5 MG/3ML) 0.083% IN NEBU
2.5000 mg | INHALATION_SOLUTION | RESPIRATORY_TRACT | Status: DC | PRN
  Administered 2016-02-25 – 2016-02-26 (×2): 2.5 mg via RESPIRATORY_TRACT
  Filled 2016-02-24 (×2): qty 3

## 2016-02-24 MED ORDER — HEPARIN (PORCINE) IN NACL 2-0.9 UNIT/ML-% IJ SOLN
INTRAMUSCULAR | Status: AC
  Filled 2016-02-24: qty 1500

## 2016-02-24 MED ORDER — SIMVASTATIN 40 MG PO TABS
40.0000 mg | ORAL_TABLET | Freq: Every day | ORAL | Status: DC
  Administered 2016-02-24 – 2016-02-25 (×2): 40 mg via ORAL
  Filled 2016-02-24 (×2): qty 1

## 2016-02-24 MED ORDER — SODIUM CHLORIDE 0.9% FLUSH: 3.0000 mL | INTRAVENOUS | Status: DC | PRN

## 2016-02-24 MED ORDER — MIDAZOLAM HCL 2 MG/2ML IJ SOLN
INTRAMUSCULAR | Status: DC | PRN
  Administered 2016-02-24: 0.5 mg via INTRAVENOUS

## 2016-02-24 MED ORDER — CETYLPYRIDINIUM CHLORIDE 0.05 % MT LIQD
7.0000 mL | Freq: Two times a day (BID) | OROMUCOSAL | Status: DC
  Administered 2016-02-24 – 2016-02-26 (×2): 7 mL via OROMUCOSAL

## 2016-02-24 MED ORDER — HEPARIN (PORCINE) IN NACL 100-0.45 UNIT/ML-% IJ SOLN
800.0000 [IU]/h | INTRAMUSCULAR | Status: DC
  Administered 2016-02-24: 600 [IU]/h via INTRAVENOUS
  Administered 2016-02-25: 800 [IU]/h via INTRAVENOUS
  Filled 2016-02-24: qty 1000
  Filled 2016-02-24: qty 250

## 2016-02-24 MED ORDER — HEPARIN SODIUM (PORCINE) 5000 UNIT/ML IJ SOLN: 3000.0000 [IU] | Freq: Once | INTRAMUSCULAR | Status: DC

## 2016-02-24 MED ORDER — ONDANSETRON HCL 4 MG/2ML IJ SOLN
4.0000 mg | Freq: Four times a day (QID) | INTRAMUSCULAR | Status: DC | PRN
  Administered 2016-02-24: 4 mg via INTRAVENOUS

## 2016-02-24 MED ORDER — SODIUM CHLORIDE 0.9% FLUSH
3.0000 mL | Freq: Two times a day (BID) | INTRAVENOUS | Status: DC
  Administered 2016-02-24 (×2): 3 mL via INTRAVENOUS

## 2016-02-24 MED ORDER — VERAPAMIL HCL 2.5 MG/ML IV SOLN
INTRAVENOUS | Status: AC
Start: 2016-02-24 — End: 2016-02-24
  Filled 2016-02-24: qty 2

## 2016-02-24 MED ORDER — FENTANYL CITRATE (PF) 100 MCG/2ML IJ SOLN
INTRAMUSCULAR | Status: DC | PRN
  Administered 2016-02-24: 25 ug via INTRAVENOUS

## 2016-02-24 MED ORDER — IOPAMIDOL (ISOVUE-370) INJECTION 76%
INTRAVENOUS | Status: DC | PRN
  Administered 2016-02-24: 55 mL via INTRA_ARTERIAL

## 2016-02-24 MED ORDER — MECLIZINE HCL 25 MG PO TABS: 25.0000 mg | ORAL_TABLET | Freq: Three times a day (TID) | ORAL | Status: DC | PRN

## 2016-02-24 MED ORDER — CLOPIDOGREL BISULFATE 75 MG PO TABS
75.0000 mg | ORAL_TABLET | Freq: Every day | ORAL | Status: DC
  Administered 2016-02-24 – 2016-02-26 (×3): 75 mg via ORAL
  Filled 2016-02-24 (×3): qty 1

## 2016-02-24 MED ORDER — NITROGLYCERIN 1 MG/10 ML FOR IR/CATH LAB
INTRA_ARTERIAL | Status: AC
  Filled 2016-02-24: qty 10

## 2016-02-24 MED ORDER — HEPARIN SODIUM (PORCINE) 5000 UNIT/ML IJ SOLN
INTRAMUSCULAR | Status: AC
  Filled 2016-02-24: qty 1

## 2016-02-24 MED ORDER — LIDOCAINE HCL (PF) 1 % IJ SOLN
INTRAMUSCULAR | Status: DC | PRN
  Administered 2016-02-24: 15 mL

## 2016-02-24 MED ORDER — SODIUM CHLORIDE 0.9 % IV SOLN
INTRAVENOUS | Status: DC | PRN
  Administered 2016-02-24: 150 mL/h via INTRAVENOUS

## 2016-02-24 MED ORDER — SODIUM CHLORIDE 0.9 % IV SOLN: 250.0000 mL | INTRAVENOUS | Status: DC | PRN

## 2016-02-24 MED ORDER — ONDANSETRON HCL 4 MG/2ML IJ SOLN
INTRAMUSCULAR | Status: AC
  Administered 2016-02-24: 4 mg via INTRAVENOUS
  Filled 2016-02-24: qty 2

## 2016-02-24 SURGICAL SUPPLY — 11 items
CATH INFINITI 5FR JL4 (CATHETERS) ×3 IMPLANT
CATH INFINITI JR4 5F (CATHETERS) ×3 IMPLANT
GLIDESHEATH SLEND A-KIT 6F 22G (SHEATH) IMPLANT
KIT ENCORE 26 ADVANTAGE (KITS) ×2 IMPLANT
KIT HEART LEFT (KITS) ×3 IMPLANT
PACK CARDIAC CATHETERIZATION (CUSTOM PROCEDURE TRAY) ×3 IMPLANT
SHEATH PINNACLE 6F 10CM (SHEATH) ×2 IMPLANT
TRANSDUCER W/STOPCOCK (MISCELLANEOUS) ×3 IMPLANT
TUBING CIL FLEX 10 FLL-RA (TUBING) ×3 IMPLANT
WIRE EMERALD 3MM-J .035X150CM (WIRE) ×3 IMPLANT
WIRE SAFE-T 1.5MM-J .035X260CM (WIRE) IMPLANT

## 2016-02-24 NOTE — Progress Notes (Signed)
[place] for heparin Indication: chest pain/ACS  Allergies  Allergen Reactions  . Actonel [Risedronate Sodium] Other (See Comments)    Headache  . Amlodipine Other (See Comments)    Pedal edema  . Fosamax [Alendronate Sodium] Other (See Comments)    Unable to tolerate  . Lisinopril Cough  . Pravastatin Other (See Comments)    Constipation.  . Codeine Rash    Patient Measurements: Height: '5\' 6"'$  (167.6 cm) Weight: 116 lb 2 oz (52.7 kg) IBW/kg (Calculated) : 59.3  Vital Signs: Temp: 97.4 F (36.3 C) (07/31 0757) Temp Source: Oral (07/31 0757) BP: 94/73 (07/31 1015) Pulse Rate: 56 (07/31 1015)  Labs:  Recent Labs  02/24/16 0335 02/24/16 0356 02/24/16 0401 02/24/16 0945  HGB 12.4 11.4* 11.9* 11.7*  HCT 37.8 34.9* 35.0* 36.1  PLT 309 264  --  241  APTT 33 >200*  --   --   LABPROT 13.6 15.9*  --   --   INR 1.04 1.26  --   --   CREATININE 0.96 0.95 0.90 0.95  CKTOTAL  --  52  --   --   CKMB  --  3.5  --   --   TROPONINI 0.05*  --   --   --     Estimated Creatinine Clearance: 42.6 mL/min (by C-G formula based on SCr of 0.95 mg/dL).   Medical History: Past Medical History:  Diagnosis Date  . Arthritis   . CAD (coronary artery disease) 03/2009   s/p MI  . Chronic combined systolic and diastolic heart failure ([place]) 06/17/2009   Qualifier: Diagnosis of  By: Burt Knack, MD, Clayburn Pert   . CKD (chronic kidney disease) stage 3, GFR 30-59 ml/min   . Emphysema/COPD ([place])   . Ex-smoker quit 2008  . GERD (gastroesophageal reflux disease)   . Helicobacter pylori gastritis 10/2007   treated  . History of CVA (cerebrovascular accident) without residual deficits 01/2012, 06/2012   R hemorrhagic MCA 01/2012 with remote lacunar infarct L putamen and IC, rpt 06/2012 acute multifocal R MCA infarct with remote hemorrhagic strokes affecting L basal ganglia and periventricular white matter, full recovery  . HLD (hyperlipidemia)   . HTN  (hypertension)   . Ischemic cardiomyopathy 02/27/2014  . Lung cancer ([place]) dx'd 07/2006   Lung CA, s/p resection, followed by Dr Earlie Server  . Macular degeneration   . Myocardial infarction [facility]) 03/2009   Acute myocardial infarction 2010 - treated with BMS of LCx. LVEF 50% with subsequent CHF  . Osteoporosis 09/2013   T -3.6 forearm 09/2013, T -4.5 forearm 04/2015     Assessment: 75yo female presents as code STEMI s/p cath 7/31, admitted w/ acute anterolateral infarction w/ occlusion of very distal LAD, to begin heparin 8hr after sheath removal (discussed w/ Dr Tamala Julian). Sheath removed ~1015 per RN note, no bleed issues noted. Hg 11.7, plt wnl, no bleed issues per RN note. APTT>200 post-cath, INR 1.26. No AC pta, Scds on.  Goal of Therapy:  Heparin level 0.3-0.7 units/ml Monitor platelets by anticoagulation protocol: Yes   Plan:  Begin heparin at 600 units/h 8hr after sheath removal at 1815 tonight 8h HL from start, daily HL/CBC Monitor s/sx bleeding  Elicia Lamp, PharmD, [facility] Clinical Pharmacist Pager [phone] 02/24/2016 10:40 AM

## 2016-02-24 NOTE — ED Notes (Signed)
Transported to the cath lab with Dr. Tamala Julian at this time, bedside report given to cath lab team.

## 2016-02-24 NOTE — Care Management Important Message (Signed)
Important Message  Patient Details  Name: [name] MRN: [id number] Date of Birth: October 19, 1940   Medicare Important Message Given:  Yes    Loann Quill 02/24/2016, 3:15 PM

## 2016-02-24 NOTE — Progress Notes (Signed)
Patient Name: [name] Date of Encounter: 02/24/2016  Active Problems:   CAD, NATIVE VESSEL   COPD (chronic obstructive pulmonary disease) (HCC)   Ischemic cardiomyopathy   CKD (chronic kidney disease) stage 3, GFR 30-59 ml/min   ACS (acute coronary syndrome) ([place])   Acute coronary syndrome [facility])   Primary Cardiologist: Dr. Burt Knack Patient Profile: [name] is a 75 year old female with a past medical history of CAD (s/p BMS to circumflex in 2010), chronic combined systolic and diastolic CHF, CVA, NSCLCA and CKD stage III. She presented to the ED on 02/24/16 with chest pain, EMS was called, code STEMI activated.  SUBJECTIVE: Feels well, no chest pain or SOB.   OBJECTIVE Vitals:   02/24/16 1430 02/24/16 1500 02/24/16 1530 02/24/16 1600  BP: (!) 88/52 (!) 92/53 (!) 86/50 (!) 96/49  Pulse: 61 63 (!) 58 71  Resp: '14 14 17 16  '$ Temp:      TempSrc:      SpO2: 94% 94% 92% 94%  Weight:      Height:        Intake/Output Summary (Last 24 hours) at 02/24/16 1613 Last data filed at 02/24/16 1400  Gross per 24 hour  Intake             1416 ml  Output                0 ml  Net             1416 ml   Filed Weights   02/24/16 0331  Weight: 116 lb 2 oz (52.7 kg)    PHYSICAL EXAM General: Well developed, well nourished, female in no acute distress. Head: Normocephalic, atraumatic.  Neck: Supple without bruits,no JVD. Lungs:  Resp regular and unlabored, CTA. Heart: RRR, S1, S2, no S3, S4, or murmur; no rub. Abdomen: Soft, non-tender, non-distended, BS + x 4.  Extremities: No clubbing, cyanosis,no edema.  Neuro: Alert and oriented X 3. Moves all extremities spontaneously. Psych: Normal affect.  LABS: CBC: Recent Labs  02/24/16 0335 02/24/16 0356 02/24/16 0401 02/24/16 0945  WBC 8.2 8.0  --  7.2  NEUTROABS 3.3  --   --   --   HGB 12.4 11.4* 11.9* 11.7*  HCT 37.8 34.9* 35.0* 36.1  MCV 93.8 94.3  --  95.8  PLT 309 264  --  241   INR: Recent Labs  02/24/16 0356    INR 0.24   Basic Metabolic Panel: Recent Labs  02/24/16 0356 02/24/16 0401 02/24/16 0945  NA 136 137 135  K 3.4* 3.4* 4.2  CL 101 98* 106  CO2 27  --  25  GLUCOSE 144* 142* 169*  BUN '15 18 14  '$ CREATININE 0.95 0.90 0.95  CALCIUM 9.3  --  9.0   Liver Function Tests: Recent Labs  02/24/16 0335 02/24/16 0356  AST 18 17  ALT 12* 10*  ALKPHOS 60 53  BILITOT 0.4 0.5  PROT 6.3* 5.8*  ALBUMIN 3.3* 3.0*   Cardiac Enzymes: Recent Labs  02/24/16 0335 02/24/16 0356  CKTOTAL  --  52  CKMB  --  3.5  TROPONINI 0.05*  --    Fasting Lipid Panel: Recent Labs  02/24/16 0356  CHOL 99  HDL 42  LDLCALC 47  TRIG 48  CHOLHDL 2.4     Current Facility-Administered Medications:  .  0.9 %  sodium chloride infusion, 250 mL, Intravenous, PRN, Belva Crome, MD .  acetaminophen (TYLENOL) tablet  650 mg, 650 mg, Oral, Q4H PRN, Belva Crome, MD .  albuterol (PROVENTIL) (2.5 MG/3ML) 0.083% nebulizer solution 2.5 mg, 2.5 mg, Inhalation, Q4H PRN, Belva Crome, MD .  antiseptic oral rinse (CPC / CETYLPYRIDINIUM CHLORIDE 0.05%) solution 7 mL, 7 mL, Mouth Rinse, BID, Belva Crome, MD, 7 mL at 02/24/16 1000 .  aspirin chewable tablet 81 mg, 81 mg, Oral, Daily, Belva Crome, MD, 81 mg at 02/24/16 0919 .  carvedilol (COREG) tablet 3.125 mg, 3.125 mg, Oral, BID WC, Belva Crome, MD, 3.125 mg at 02/24/16 0973 .  clopidogrel (PLAVIX) tablet 75 mg, 75 mg, Oral, Daily, Belva Crome, MD, 75 mg at 02/24/16 0919 .  [START ON 02/25/2016] furosemide (LASIX) tablet 40 mg, 40 mg, Oral, Daily, Belva Crome, MD .  heparin ADULT infusion 100 units/mL (25000 units/275m sodium chloride 0.45%), 600 Units/hr, Intravenous, Continuous, HRomona Curls RPH .  isosorbide mononitrate (IMDUR) 24 hr tablet 30 mg, 30 mg, Oral, Daily, HBelva Crome MD, 30 mg at 02/24/16 0920 .  losartan (COZAAR) tablet 50 mg, 50 mg, Oral, Daily, HBelva Crome MD, 50 mg at 02/24/16 0920 .  meclizine (ANTIVERT) tablet 25 mg, 25 mg,  Oral, TID PRN, HBelva Crome MD .  nitroGLYCERIN (NITROSTAT) SL tablet 0.4 mg, 0.4 mg, Sublingual, Q5 min PRN, HBelva Crome MD .  ondansetron ([facility] injection 4 mg, 4 mg, Intravenous, Q6H PRN, HBelva Crome MD, 4 mg at 02/24/16 0505 .  pantoprazole (PROTONIX) EC tablet 40 mg, 40 mg, Oral, Daily, HBelva Crome MD, 40 mg at 02/24/16 0919 .  simvastatin (ZOCOR) tablet 40 mg, 40 mg, Oral, QHS, HBelva Crome MD .  sodium chloride flush (NS) 0.9 % injection 3 mL, 3 mL, Intravenous, Q12H, HBelva Crome MD, 3 mL at 02/24/16 [phone].  sodium chloride flush (NS) 0.9 % injection 3 mL, 3 mL, Intravenous, PRN, HBelva Crome MD .  [Derrill MemoON 02/25/2016] spironolactone (ALDACTONE) tablet 12.5 mg, 12.5 mg, Oral, Daily, HBelva Crome MD . heparin     TELE:  NSR     Left Heart Cath and Coronary Angiography 02/24/16  Prox LAD lesion, 65 %stenosed.  Ost 1st Diag lesion, 65 %stenosed.  Prox Cx to Mid Cx lesion, 10 %stenosed.  The left ventricular ejection fraction is 35-45% by visual estimate.  LV end diastolic pressure is normal.  There is moderate left ventricular systolic dysfunction.  Dist LAD lesion, 100 %stenosed.    Acute coronary syndrome with occlusion of distal LAD in a small tortuous segment.  Widely patent stent in the circumflex.  Eccentric 65% stenosis in the mid LAD beyond the first agonal. 60% first diagonal ostial stenosis. Both sites are unchanged from prior angiogram. New finding of very distal LAD total occlusion.  Dominant right coronary artery with luminal irregularities.  Moderate left ventricular systolic dysfunction with regional wall motion abnormality including new apical severe hypokinesis and suggestion of anterolateral wall akinesis/dyskinesis (old). Estimated ejection fraction is 35%.  LVEDP is low normal.  RECOMMENDATIONS:   Serial cardiac markers  Add Plavix to medical regimen.  IV hydration  Risk factor modification  Anticoagulation  Current  Medications:  . antiseptic oral rinse  7 mL Mouth Rinse BID  . aspirin  81 mg Oral Daily  . carvedilol  3.125 mg Oral BID WC  . clopidogrel  75 mg Oral Daily  . [START ON 02/25/2016] furosemide  40 mg Oral Daily  . isosorbide mononitrate  30 mg Oral Daily  . losartan  50 mg Oral Daily  . pantoprazole  40 mg Oral Daily  . simvastatin  40 mg Oral QHS  . sodium chloride flush  3 mL Intravenous Q12H  . [START ON 02/25/2016] spironolactone  12.5 mg Oral Daily   . heparin      ASSESSMENT AND PLAN: Active Problems:   CAD, NATIVE VESSEL   COPD (chronic obstructive pulmonary disease) (HCC)   Ischemic cardiomyopathy   CKD (chronic kidney disease) stage 3, GFR 30-59 ml/min   ACS (acute coronary syndrome) (HCC)   Acute coronary syndrome ([place])  1. CAD: Presented as code STEMI, cath report above. Her previous stent to Circumflex is patent. There is a new very distal LAD occlusion that was not amendable to PCI. Plavix was added to her medication regimen. She is on long acting nitrates. Chest pain free. Can transfer to tele.   2. Ischemic cardiomyopathy: EF estimated at 35% by cath with new severe apical hypokinesis. Continue Coreg and ARB. Arlyce Harman added, to start tomorrow. LVEDP was not elevated. Will need Echo.   3. CKD stage III: Stable, GFR is 57.     Signed, Arbutus Leas , NP 4:13 PM 02/24/2016 Pager [phone]    Patient seen and examined. Agree with assessment and plan. Angiographic findings reviewed.  Total distal LAD occlusion resulting in apical wall motion abnormality.  Meduical therapy. Continue DAPT. Titrate coreg as BP and P allow.    Troy Sine, MD, [facility] 02/24/2016 6:39 PM

## 2016-02-24 NOTE — Progress Notes (Signed)
CRITICAL VALUE ALERT  Critical value received:  PTT >200  Date of notification:  02/24/16  Time of notification:  0600  Critical value read back:Yes.    Nurse who received alert:  Dorene Grebe, RN  MD notified (1st page):  Dr. Acie Fredrickson  Time of first page:  [phone]  MD notified (2nd page): Dr. Acie Fredrickson   Time of second page: 0700  Responding MD:  Dr. Acie Fredrickson  Time MD responded:  [id number], no new orders received at this time.

## 2016-02-24 NOTE — Progress Notes (Signed)
   02/24/16 0300  Clinical Encounter Type  Visited With Patient;Family  Visit Type ED  Referral From Nurse  Consult/Referral To Chaplain  Stress Factors  Patient Stress Factors Health changes  Family Stress Factors Health changes   Chaplain responded to ED, Code Stemi.  Patient alert and talking with healthcare staff.  Patient taken upstairs to cath lab. Chaplain escorted family to waiting room.  Vilinda Blanks Karli Wickizer 02/24/16 3:50 AM

## 2016-02-24 NOTE — Progress Notes (Signed)
ANTICOAGULATION CONSULT NOTE - Initial Consult  Pharmacy Consult for heparin Indication: chest pain/ACS  Allergies  Allergen Reactions  . Actonel [Risedronate Sodium] Other (See Comments)    Headache  . Amlodipine Other (See Comments)    Pedal edema  . Fosamax [Alendronate Sodium] Other (See Comments)    Unable to tolerate  . Lisinopril Cough  . Pravastatin Other (See Comments)    Constipation.  . Codeine Rash    Patient Measurements: Height: '5\' 6"'$  (167.6 cm) Weight: 116 lb 2 oz (52.7 kg) IBW/kg (Calculated) : 59.3  Vital Signs: Temp: 97.8 F (36.6 C) (07/31 0331) Temp Source: Oral (07/31 0331) BP: 94/58 (07/31 0400) Pulse Rate: 62 (07/31 0400)  Labs:  Recent Labs  02/24/16 0335 02/24/16 0356 02/24/16 0401  HGB 12.4 11.4* 11.9*  HCT 37.8 34.9* 35.0*  PLT 309 264  --   APTT 33  --   --   LABPROT 13.6  --   --   INR 1.04  --   --   CREATININE 0.96  --  0.90  TROPONINI 0.05*  --   --     Estimated Creatinine Clearance: 44.9 mL/min (by C-G formula based on SCr of 0.9 mg/dL).   Medical History: Past Medical History:  Diagnosis Date  . Arthritis   . CAD (coronary artery disease) 03/2009   s/p MI  . Chronic combined systolic and diastolic heart failure ([place]) 06/17/2009   Qualifier: Diagnosis of  By: Burt Knack, MD, Clayburn Pert   . CKD (chronic kidney disease) stage 3, GFR 30-59 ml/min   . Emphysema/COPD ([place])   . Ex-smoker quit 2008  . GERD (gastroesophageal reflux disease)   . Helicobacter pylori gastritis 10/2007   treated  . History of CVA (cerebrovascular accident) without residual deficits 01/2012, 06/2012   R hemorrhagic MCA 01/2012 with remote lacunar infarct L putamen and IC, rpt 06/2012 acute multifocal R MCA infarct with remote hemorrhagic strokes affecting L basal ganglia and periventricular white matter, full recovery  . HLD (hyperlipidemia)   . HTN (hypertension)   . Ischemic cardiomyopathy 02/27/2014  . Lung cancer ([place]) dx'd 07/2006   Lung CA, s/p  resection, followed by Dr Earlie Server  . Macular degeneration   . Myocardial infarction [facility]) 03/2009   Acute myocardial infarction 2010 - treated with BMS of LCx. LVEF 50% with subsequent CHF  . Osteoporosis 09/2013   T -3.6 forearm 09/2013, T -4.5 forearm 04/2015     Assessment: 75yo female presents as code STEMI and taken to cath lab, admitted w/ acute anterolateral infarction w/ occlusion of very distal LAD, to begin heparin 8hr after sheath removal (discussed w/ Dr Tamala Julian).  Goal of Therapy:  Heparin level 0.3-0.7 units/ml Monitor platelets by anticoagulation protocol: Yes   Plan:  Will f/u w/ sheath removal and schedule heparin to begin at 600 units/hr 8hr after 8hr and monitor heparin levels and CBC.  Wynona Neat, PharmD, BCPS  02/24/2016,5:00 AM

## 2016-02-24 NOTE — H&P (Signed)
History and Physical   Admit date: 02/24/2016 Name:  [name] Medical record number: [id number] DOB/Age:  [birth year]  75 y.o. female  Primary Cardiologist:  Burt Knack   Primary Physician:   Dr. Chryl Heck  Chief complaint/reason for admission: Chest pain.   HPI:  This 75 year old female presented to the emergency room with prolonged chest discomfort that awakened her from sleep around 1:15 AM. Eventually EMS was called and the patient presented as a code STEMI with ST elevation in the lateral leads and chest pain. She was given aspirin and Zofran and nitroglycerin and was taken directly to the cardiac catheterization lab. The patient was pain-free on arrival to the Cath Lab. She has a prior history of a bare-metal stent to the circumflex in 2010 and a bare-metal stent to the circumflex at that time. She also had a right MCA stroke in 2013 and has a history of non-small cell lung cancer with a previous curative resection. She has had some left arm numbness and was all to have TIAs. He was recently seen by neurology on July 14 and was recommended to continue with stroke prevention and blood pressure medicines. She had evidently declined to have further workup for etiology. She was taken to the cardiac cath lab tonight and had cardiac catheterization that showed the previous stent to be patent and the right coronary artery to be patent, there was mild to moderate disease involving the midportion of the LAD and on further review of the films there was occlusion of the distal LAD at the apex. A ventriculogram appeared to show worsening of the wall motion at the apex. It was felt to be too far distal for intervention. She is admitted to CCU for further evaluation.  Past Medical History:  Diagnosis Date  . Arthritis   . CAD (coronary artery disease) 03/2009   s/p MI  . Chronic combined systolic and diastolic heart failure ([place]) 06/17/2009   Qualifier: Diagnosis of  By: Burt Knack, MD, [place]burn Pert   . CKD  (chronic kidney disease) stage 3, GFR 30-59 ml/min   . Emphysema/COPD ([place])   . Ex-smoker quit 2008  . GERD (gastroesophageal reflux disease)   . Helicobacter pylori gastritis 10/2007   treated  . History of CVA (cerebrovascular accident) without residual deficits 01/2012, 06/2012   R hemorrhagic MCA 01/2012 with remote lacunar infarct L putamen and IC, rpt 06/2012 acute multifocal R MCA infarct with remote hemorrhagic strokes affecting L basal ganglia and periventricular white matter, full recovery  . HLD (hyperlipidemia)   . HTN (hypertension)   . Ischemic cardiomyopathy 02/27/2014  . Lung cancer ([place]) dx'd 07/2006   Lung CA, s/p resection, followed by Dr Earlie Server  . Macular degeneration   . Myocardial infarction [facility]) 03/2009   Acute myocardial infarction 2010 - treated with BMS of LCx. LVEF 50% with subsequent CHF  . Osteoporosis 09/2013   T -3.6 forearm 09/2013, T -4.5 forearm 04/2015     Past Surgical History:  Procedure Laterality Date  . BREAST BIOPSY Left   . CATARACT EXTRACTION Bilateral   . LUNG REMOVAL, PARTIAL Right 2008  . PARTIAL HYSTERECTOMY  1986   irregular periods, ovaries remain   Allergies: is allergic to actonel [risedronate sodium]; amlodipine; fosamax [alendronate sodium]; lisinopril; pravastatin; and codeine.   Medications: Prior to Admission medications   Medication Sig Start Date End Date Taking? Authorizing Provider  albuterol (PROAIR HFA) 108 (90 BASE) MCG/ACT inhaler Inhale 2 puffs into the lungs every 4 (four) hours as needed  for wheezing or shortness of breath. 02/12/15   Tammy S Parrett, NP  aspirin 325 MG tablet Take 1 tablet (325 mg total) by mouth daily. 07/11/12   Orson Eva, MD  Calcium Carbonate-Vitamin D (CALCIUM-VITAMIN D) 500-200 MG-UNIT per tablet Take 2 tablets by mouth daily.    Historical Provider, MD  carvedilol (COREG) 3.125 MG tablet TAKE 1 TABLET BY MOUTH TWICE A DAY WITH FOOD 06/13/15   Sherren Mocha, MD  Cholecalciferol (VITAMIN D) 2000  UNITS CAPS Take 1 capsule by mouth daily.    Historical Provider, MD  denosumab (PROLIA) 60 MG/ML SOLN injection Inject 60 mg into the skin every 6 (six) months. Administer in upper arm, thigh, or abdomen 06/13/15   Ria Bush, MD  furosemide (LASIX) 40 MG tablet TAKE 1 TABLET BY MOUTH DAILY 05/30/15   Sherren Mocha, MD  isosorbide mononitrate (IMDUR) 30 MG 24 hr tablet TAKE 1 TABLET BY MOUTH DAILY 11/19/15   Sherren Mocha, MD  losartan (COZAAR) 50 MG tablet TAKE 1 TABLET BY MOUTH DAILY 05/30/15   Sherren Mocha, MD  LOTEMAX 0.5 % ophthalmic suspension Place 1 drop into both eyes daily.  12/27/13   Historical Provider, MD  meclizine (ANTIVERT) 25 MG tablet Take 1 tablet (25 mg total) by mouth 3 (three) times daily as needed for dizziness. 04/20/15   Harvel Quale, MD  nitroGLYCERIN (NITROSTAT) 0.4 MG SL tablet Place 1 tablet (0.4 mg total) under the tongue every 5 (five) minutes as needed for chest pain. 07/03/15   Liliane Shi, PA-C  omeprazole (PRILOSEC) 20 MG capsule TAKE ONE CAPSULE BY MOUTH DAILY 05/30/15   Sherren Mocha, MD  simvastatin (ZOCOR) 40 MG tablet TAKE ONE TABLET BY MOUTH AT BEDTIME 05/14/15   Sherren Mocha, MD  spironolactone (ALDACTONE) 25 MG tablet TAKE 1/2  TABLET BY MOUTH ONCE DAILY. 03/20/15   Sherren Mocha, MD   Family History:  Family Status  Relation Status  . Mother Deceased  . Father Deceased  . Brother Alive  . Sister Alive  . Son Alive  . Maternal Grandmother Deceased  . Maternal Grandfather Deceased  . Paternal Grandmother Deceased   Social History:   reports that she quit smoking about 9 years ago. Her smoking use included Cigarettes. She has a 80.00 pack-year smoking history. She has never used smokeless tobacco. She reports that she does not drink alcohol or use drugs.   Social History   Social History Narrative   The patient is a widow and has one son and one dog.     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  She does not drink alcohol. 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   Ed: HS   Activity: no regular exercise   Diet: some water, some fruits/vegetables    Lives in a one story home.      Review of Systems:  Other than as noted above, the remainder of the review of systems is normal  Physical Exam: BP (!) 94/58   Pulse 62   Temp 97.8 F (36.6 C) (Oral)   Resp 11   Ht '5\' 6"'$  (1.676 m)   Wt 52.7 kg (116 lb 2 oz)   SpO2 100%   BMI 18.74 kg/m  General appearance: He is a thin white female currently lying on the cath table in no acute distress Head: Normocephalic, without obvious abnormality, atraumatic Eyes: conjunctivae/corneas clear. PERRL, EOM's  intact. Fundi not examined  Neck: no adenopathy, no carotid bruit, no JVD and supple, symmetrical, trachea midline Lungs: clear to auscultation bilaterally Heart: regular rate and rhythm, S1, S2 normal, no murmur, click, rub or gallop Abdomen: soft, non-tender; bowel sounds normal; no masses,  no organomegaly Rectal: deferred Extremities: extremities normal, atraumatic, no cyanosis or edema Pulses: 2+ and symmetric Skin: Skin color, texture, turgor normal. No rashes or lesions Neurologic: Grossly normal   Labs: CBC  Recent Labs  02/24/16 0335  WBC 8.2  RBC 4.03  HGB 12.4  HCT 37.8  PLT 309  MCV 93.8  MCH 30.8  MCHC 32.8  RDW 12.8  LYMPHSABS 3.3  MONOABS 0.5  EOSABS 1.1*  BASOSABS 0.1   EKG: Sinus rhythm, old anteroseptal infarct, nonspecific ST changes no acute ST elevation in the lateral leads  IMPRESSIONS: 1. Acute anterolateral infarction with occlusion of the very distal LAD  2. Coronary artery disease with patent bare-metal stent involving the circumflex with previous lateral infarction 3. History of previous right MCA infarct 4. Hyperlipidemia 5. History of stage I lung cancer  PLAN: Patient will be admitted to the CCU and troponins will be cycled. Beta blocker,Probably ARB watch  renal function. Follow-up echocardiogram.  Signed: Kerry Hough MD [facility]  02/24/2016, 4:09 AM

## 2016-02-24 NOTE — ED Triage Notes (Signed)
Pt arrives via EMS as a code stemi, woke up at [place] with CP L sided radiating into L arm. Described as pressure/heaviness. Nausea, no diaphoresis, no SHOB. EMS gave '4MG'$  zofran. EMS EKG shows ST elevation in  V4 and V5, and brief period of ventricular  Initially 7/10, now chest pain free. Hx two MIs. 16G IV in AC. 324 MG ASA on board

## 2016-02-24 NOTE — Progress Notes (Signed)
38F Sheath removed from Rt Femoral Artery. Manual pressure held for 20 minutes. Hemostasis achieved. Rt groin soft with no active bleeding or hematoma noted. VSS. Patient resting in bed comfortably with no complaints at this time. Josh McDaniel,RN assessed groin and care transferred back to this RN.

## 2016-02-25 DIAGNOSIS — I255 Ischemic cardiomyopathy: Secondary | ICD-10-CM | POA: Diagnosis not present

## 2016-02-25 DIAGNOSIS — I25119 Atherosclerotic heart disease of native coronary artery with unspecified angina pectoris: Secondary | ICD-10-CM | POA: Diagnosis not present

## 2016-02-25 DIAGNOSIS — I249 Acute ischemic heart disease, unspecified: Secondary | ICD-10-CM | POA: Diagnosis not present

## 2016-02-25 LAB — CBC
HCT: 34.7 % — ABNORMAL LOW (ref 36.0–46.0)
Hemoglobin: 11.2 g/dL — ABNORMAL LOW (ref 12.0–15.0)
MCH: 30.7 pg (ref 26.0–34.0)
MCHC: 32.3 g/dL (ref 30.0–36.0)
MCV: 95.1 fL (ref 78.0–100.0)
PLATELETS: 215 10*3/uL (ref 150–400)
RBC: 3.65 MIL/uL — AB (ref 3.87–5.11)
RDW: 13.1 % (ref 11.5–15.5)
WBC: 8.2 10*3/uL (ref 4.0–10.5)

## 2016-02-25 LAB — HEPARIN LEVEL (UNFRACTIONATED)
HEPARIN UNFRACTIONATED: 0.11 [IU]/mL — AB (ref 0.30–0.70)
HEPARIN UNFRACTIONATED: 0.37 [IU]/mL (ref 0.30–0.70)
HEPARIN UNFRACTIONATED: 0.35 [IU]/mL (ref 0.30–0.70)

## 2016-02-25 LAB — BASIC METABOLIC PANEL
Anion gap: 5 (ref 5–15)
BUN: 11 mg/dL (ref 6–20)
CALCIUM: 8.9 mg/dL (ref 8.9–10.3)
CO2: 31 mmol/L (ref 22–32)
CREATININE: 0.96 mg/dL (ref 0.44–1.00)
Chloride: 101 mmol/L (ref 101–111)
GFR calc non Af Amer: 56 mL/min — ABNORMAL LOW (ref >60)
Glucose, Bld: 98 mg/dL (ref 65–99)
Potassium: 4.1 mmol/L (ref 3.5–5.1)
SODIUM: 137 mmol/L (ref 135–145)

## 2016-02-25 LAB — MAGNESIUM: MAGNESIUM: 2.1 mg/dL (ref 1.7–2.4)

## 2016-02-25 LAB — HEMOGLOBIN A1C
HEMOGLOBIN A1C: 5.9 % — AB (ref 4.8–5.6)
Mean Plasma Glucose: 123 mg/dL

## 2016-02-25 MED ORDER — CARVEDILOL 6.25 MG PO TABS
6.2500 mg | ORAL_TABLET | Freq: Two times a day (BID) | ORAL | Status: DC
  Administered 2016-02-26: 6.25 mg via ORAL
  Filled 2016-02-25: qty 1

## 2016-02-25 MED ORDER — GUAIFENESIN-DM 100-10 MG/5ML PO SYRP
5.0000 mL | ORAL_SOLUTION | ORAL | Status: DC | PRN
  Administered 2016-02-25: 5 mL via ORAL
  Filled 2016-02-25: qty 5

## 2016-02-25 MED FILL — Verapamil HCl IV Soln 2.5 MG/ML: INTRAVENOUS | Qty: 2 | Status: AC

## 2016-02-25 MED FILL — Nitroglycerin IV Soln 100 MCG/ML in D5W: INTRA_ARTERIAL | Qty: 10 | Status: AC

## 2016-02-25 NOTE — Progress Notes (Signed)
[place] for heparin Indication: chest pain/ACS  Allergies  Allergen Reactions  . Actonel [Risedronate Sodium] Other (See Comments)    Headache  . Amlodipine Other (See Comments)    Pedal edema  . Fosamax [Alendronate Sodium] Other (See Comments)    Unable to tolerate  . Lisinopril Cough  . Pravastatin Other (See Comments)    Constipation.  . Codeine Rash    Patient Measurements: Height: '5\' 6"'$  (167.6 cm) Weight: 116 lb 10 oz (52.9 kg) IBW/kg (Calculated) : 59.3  Vital Signs: BP: 106/88 (08/01 1623) Pulse Rate: 67 (08/01 1200)  Labs:  Recent Labs  02/24/16 0335 02/24/16 0356 02/24/16 0401 02/24/16 0945 02/25/16 0156 02/25/16 1122 02/25/16 1852  HGB 12.4 11.4* 11.9* 11.7* 11.2*  --   --   HCT 37.8 34.9* 35.0* 36.1 34.7*  --   --   PLT 309 264  --  241 215  --   --   APTT 33 >200*  --   --   --   --   --   LABPROT 13.6 15.9*  --   --   --   --   --   INR 1.04 1.26  --   --   --   --   --   HEPARINUNFRC  --   --   --   --  0.11* 0.37 0.35  CREATININE 0.96 0.95 0.90 0.95  --   --  0.96  CKTOTAL  --  52  --   --   --   --   --   CKMB  --  3.5  --   --   --   --   --   TROPONINI 0.05*  --   --   --   --   --   --     Estimated Creatinine Clearance: 42.3 mL/min (by C-G formula based on SCr of 0.96 mg/dL).   Assessment: 75yo female presents as code STEMI s/p cath 7/31, admitted w/ acute anterolateral infarction w/ occlusion of very distal LAD. Heparin restarted 8hr after sheath removal. Heparin level 0.35 - therapeutic x2 on 800 units/hr. No issues with line or bleeding documented. Hgb stable, plt trending down but still > 200.   Goal of Therapy:  Heparin level 0.3-0.7 units/ml Monitor platelets by anticoagulation protocol: Yes   Plan:  Continue Heparin at 800 units/hr Daily HL/CBC Monitor s/sx bleeding  Sloan Leiter, PharmD, BCPS Clinical Pharmacist [phone] 02/25/2016 7:43 PM

## 2016-02-25 NOTE — Progress Notes (Signed)
Patient Name: [name] Date of Encounter: 02/25/2016  Active Problems:   CAD, NATIVE VESSEL   COPD (chronic obstructive pulmonary disease) (HCC)   Ischemic cardiomyopathy   CKD (chronic kidney disease) stage 3, GFR 30-59 ml/min   ACS (acute coronary syndrome) ([place])   Acute coronary syndrome [facility])   Primary Cardiologist: Dr. Burt Knack Patient Profile: [name] is a 75 year old female with a past medical history of CAD (s/p BMS to circumflex in 2010), chronic combined systolic and diastolic CHF, CVA, NSCLCA and CKD stage III. She presented to the ED on 02/24/16 with chest pain, EMS was called, code STEMI activated.  SUBJECTIVE:  Day 2 s/p MI;  Feels well, no chest pain or SOB.   OBJECTIVE Vitals:   02/25/16 0800 02/25/16 0900 02/25/16 1000 02/25/16 1100  BP: 110/73 102/65 116/65 120/69  Pulse: 65 71 74 74  Resp: 18 20 (!) 24 17  Temp:      TempSrc:      SpO2: 92% 95% 94% 94%  Weight:      Height:        Intake/Output Summary (Last 24 hours) at 02/25/16 1216 Last data filed at 02/25/16 1100  Gross per 24 hour  Intake           664.47 ml  Output                0 ml  Net           664.47 ml   I/O since admission: +1840  Current Medications:  . antiseptic oral rinse  7 mL Mouth Rinse BID  . aspirin  81 mg Oral Daily  . carvedilol  3.125 mg Oral BID WC  . clopidogrel  75 mg Oral Daily  . furosemide  40 mg Oral Daily  . isosorbide mononitrate  30 mg Oral Daily  . losartan  50 mg Oral Daily  . pantoprazole  40 mg Oral Daily  . simvastatin  40 mg Oral QHS  . sodium chloride flush  3 mL Intravenous Q12H  . spironolactone  12.5 mg Oral Daily   . heparin 800 Units/hr (02/25/16 1100)     Filed Weights   02/24/16 0331 02/25/16 0600  Weight: 116 lb 2 oz (52.7 kg) 116 lb 10 oz (52.9 kg)    PHYSICAL EXAM General: Well developed, well nourished, female in no acute distress. Head: Normocephalic, atraumatic.  Neck: Supple without bruits,no JVD. Lungs:  Resp  regular and unlabored, CTA. Heart: RRR, S1, S2, no S3, S4, or murmur; no rub. Abdomen: Soft, non-tender, non-distended, BS + x 4.  Extremities: No clubbing, cyanosis,no edema.  Neuro: Alert and oriented X 3. Moves all extremities spontaneously. Psych: Normal affect.  LABS: CBC: Recent Labs  02/24/16 0335  02/24/16 0945 02/25/16 0156  WBC 8.2  < > 7.2 8.2  NEUTROABS 3.3  --   --   --   HGB 12.4  < > 11.7* 11.2*  HCT 37.8  < > 36.1 34.7*  MCV 93.8  < > 95.8 95.1  PLT 309  < > 241 215  < > = values in this interval not displayed. INR:  Recent Labs  02/24/16 0356  INR 0.26   Basic Metabolic Panel:  Recent Labs  02/24/16 0356 02/24/16 0401 02/24/16 0945  NA 136 137 135  K 3.4* 3.4* 4.2  CL 101 98* 106  CO2 27  --  25  GLUCOSE 144* 142* 169*  BUN 15 18  14  CREATININE 0.95 0.90 0.95  CALCIUM 9.3  --  9.0   Liver Function Tests:  Recent Labs  02/24/16 0335 02/24/16 0356  AST 18 17  ALT 12* 10*  ALKPHOS 60 53  BILITOT 0.4 0.5  PROT 6.3* 5.8*  ALBUMIN 3.3* 3.0*   Cardiac Enzymes:  Recent Labs  02/24/16 0335 02/24/16 0356  CKTOTAL  --  52  CKMB  --  3.5  TROPONINI 0.05*  --     BNP (last 3 results) No results for input(s): BNP in the last 8760 hours.  ProBNP (last 3 results) No results for input(s): PROBNP in the last 8760 hours.  Fasting Lipid Panel:  Recent Labs  02/24/16 0356  CHOL 99  HDL 42  LDLCALC 47  TRIG 48  CHOLHDL 2.4   TELE:  NSR     Left Heart Cath and Coronary Angiography 02/24/16  Prox LAD lesion, 65 %stenosed.  Ost 1st Diag lesion, 65 %stenosed.  Prox Cx to Mid Cx lesion, 10 %stenosed.  The left ventricular ejection fraction is 35-45% by visual estimate.  LV end diastolic pressure is normal.  There is moderate left ventricular systolic dysfunction.  Dist LAD lesion, 100 %stenosed.    Acute coronary syndrome with occlusion of distal LAD in a small tortuous segment.  Widely patent stent in the  circumflex.  Eccentric 65% stenosis in the mid LAD beyond the first agonal. 60% first diagonal ostial stenosis. Both sites are unchanged from prior angiogram. New finding of very distal LAD total occlusion.  Dominant right coronary artery with luminal irregularities.  Moderate left ventricular systolic dysfunction with regional wall motion abnormality including new apical severe hypokinesis and suggestion of anterolateral wall akinesis/dyskinesis (old). Estimated ejection fraction is 35%.  LVEDP is low normal.   RECOMMENDATIONS:   Serial cardiac markers  Add Plavix to medical regimen.  IV hydration  Risk factor modification  Anticoagulation       ASSESSMENT AND PLAN: Active Problems:   CAD, NATIVE VESSEL   COPD (chronic obstructive pulmonary disease) (HCC)   Ischemic cardiomyopathy   CKD (chronic kidney disease) stage 3, GFR 30-59 ml/min   ACS (acute coronary syndrome) (HCC)   Acute coronary syndrome ([place])  1. CAD: Presented as code STEMI, cath report above. Her previous stent to Circumflex is patent. There is a new very distal LAD occlusion that was not amendable to PCI. Plavix was added to her medication regimen. She is on long acting nitrates. Chest pain free.  Will titrate carvedilol to 6.25 mg bid today  2. Ischemic cardiomyopathy: EF estimated at 35% by cath with new severe apical hypokinesis. Continue Coreg and ARB. Arlyce Harman added, to start tomorrow. LVEDP was not elevated.   3. CKD stage III: Stable, Cr 0.95. GFR is 57.   4. Mild anemia:  Will transfer to telemetry today. Ambulate.  Possible dc tomorrow.  [name] , MD, [facility] 12:16 PM 02/25/2016

## 2016-02-25 NOTE — Care Management Note (Signed)
Case Management Note  Patient Details  Name: [name] MRN: [id number] Date of Birth: September 22, 1940  Subjective/Objective:     Pt admitted with Code Stemi               Action/Plan:  PTA pt was independent from home alone.  Brother and son are close by and will assist pt as needed.  CM will continue to monitor for discharge needs   Expected Discharge Date:                  Expected Discharge Plan:  Home/Self Care  In-House Referral:     Discharge planning Services  CM Consult  Post Acute Care Choice:    Choice offered to:     DME Arranged:    DME Agency:     HH Arranged:    HH Agency:     Status of Service:  In process, will continue to follow  If discussed at Long Length of Stay Meetings, dates discussed:    Additional Comments:  Maryclare Labrador, RN 02/25/2016, 11:00 AM

## 2016-02-25 NOTE — Progress Notes (Signed)
Pt noted to be in trigeminy by nursing without acute sx. Reportedly in bigeminy earlier today. Will check stat BMET and Mg with nurse parameters to call for specific abnormals. Dayna Dunn PA-C

## 2016-02-25 NOTE — Progress Notes (Signed)
[place] for heparin Indication: chest pain/ACS  Allergies  Allergen Reactions  . Actonel [Risedronate Sodium] Other (See Comments)    Headache  . Amlodipine Other (See Comments)    Pedal edema  . Fosamax [Alendronate Sodium] Other (See Comments)    Unable to tolerate  . Lisinopril Cough  . Pravastatin Other (See Comments)    Constipation.  . Codeine Rash    Patient Measurements: Height: '5\' 6"'$  (167.6 cm) Weight: 116 lb 10 oz (52.9 kg) IBW/kg (Calculated) : 59.3  Vital Signs: Temp: 97.8 F (36.6 C) (08/01 0700) Temp Source: Oral (08/01 0700) BP: 102/65 (08/01 0900) Pulse Rate: 71 (08/01 0900)  Labs:  Recent Labs  02/24/16 0335 02/24/16 0356 02/24/16 0401 02/24/16 0945 02/25/16 0156  HGB 12.4 11.4* 11.9* 11.7* 11.2*  HCT 37.8 34.9* 35.0* 36.1 34.7*  PLT 309 264  --  241 215  APTT 33 >200*  --   --   --   LABPROT 13.6 15.9*  --   --   --   INR 1.04 1.26  --   --   --   HEPARINUNFRC  --   --   --   --  0.11*  CREATININE 0.96 0.95 0.90 0.95  --   CKTOTAL  --  52  --   --   --   CKMB  --  3.5  --   --   --   TROPONINI 0.05*  --   --   --   --     Estimated Creatinine Clearance: 42.7 mL/min (by C-G formula based on SCr of 0.95 mg/dL).   Assessment: 75yo female presents as code STEMI s/p cath 7/31, admitted w/ acute anterolateral infarction w/ occlusion of very distal LAD. Heparin restarted 8hr after sheath removal. Heparin level 0.37 (therapeutic) x1 on 800 units/hr. No issues with line or bleeding documented. Hgb stable, plt trending down but still > 200.   Goal of Therapy:  Heparin level 0.3-0.7 units/ml Monitor platelets by anticoagulation protocol: Yes   Plan:  Heparin at 800 units/hr 8h HL to confirm Daily HL/CBC Monitor s/sx bleeding  Elicia Lamp, PharmD, [facility] Clinical Pharmacist Pager [phone] 02/25/2016 9:57 AM

## 2016-02-25 NOTE — Progress Notes (Signed)
Patient transferred to [place] by myself and Hyiu, RN.  Pt. Report given to Lucina Mellow. Transferred safely via wheelchair. Vital signs stable before, during and after transfer. All belongings were taken with patient. Chart given to 2w staff at nursing desk. Tyrone Schimke, RN

## 2016-02-25 NOTE — Progress Notes (Signed)
Patient's last Potassium level 4.1 and Magnesium 2.1. Patient is still in ventricular trigeminy without distress. Will continue to monitor.

## 2016-02-25 NOTE — Progress Notes (Signed)
[place] for heparin Indication: chest pain/ACS  Allergies  Allergen Reactions  . Actonel [Risedronate Sodium] Other (See Comments)    Headache  . Amlodipine Other (See Comments)    Pedal edema  . Fosamax [Alendronate Sodium] Other (See Comments)    Unable to tolerate  . Lisinopril Cough  . Pravastatin Other (See Comments)    Constipation.  . Codeine Rash    Patient Measurements: Height: '5\' 6"'$  (167.6 cm) Weight: 116 lb 2 oz (52.7 kg) IBW/kg (Calculated) : 59.3  Vital Signs: Temp: 97.8 F (36.6 C) (07/31 2350) Temp Source: Oral (07/31 2350) BP: 107/74 (08/01 0200) Pulse Rate: 71 (08/01 0200)  Labs:  Recent Labs  02/24/16 0335 02/24/16 0356 02/24/16 0401 02/24/16 0945 02/25/16 0156  HGB 12.4 11.4* 11.9* 11.7* 11.2*  HCT 37.8 34.9* 35.0* 36.1 34.7*  PLT 309 264  --  241 215  APTT 33 >200*  --   --   --   LABPROT 13.6 15.9*  --   --   --   INR 1.04 1.26  --   --   --   HEPARINUNFRC  --   --   --   --  0.11*  CREATININE 0.96 0.95 0.90 0.95  --   CKTOTAL  --  52  --   --   --   CKMB  --  3.5  --   --   --   TROPONINI 0.05*  --   --   --   --     Estimated Creatinine Clearance: 42.6 mL/min (by C-G formula based on SCr of 0.95 mg/dL).   Assessment: 75yo female presents as code STEMI s/p cath 7/31, admitted w/ acute anterolateral infarction w/ occlusion of very distal LAD. Heparin restarted 8hr after sheath removal. Heparin level 0.11 (subtherapeutic) on 600 units/hr. No issues with line or bleeding reported per RN. Hgb stable, plt trending down but still > 200.  Goal of Therapy:  Heparin level 0.3-0.7 units/ml Monitor platelets by anticoagulation protocol: Yes   Plan:  Increase heparin to 800 units/hr F/u 8 hr heparin level  Sherlon Handing, PharmD, BCPS Clinical pharmacist, pager [phone] 02/25/2016 2:50 AM

## 2016-02-25 NOTE — Progress Notes (Signed)
Notified by CCMD of patient in trigeminy, On call PA made aware, STAT labs ordered with a care order instruction to call with parameters. Will pass off in shift report and continue to monitor.

## 2016-02-26 ENCOUNTER — Telehealth: Payer: Self-pay | Admitting: Family Medicine

## 2016-02-26 ENCOUNTER — Encounter (HOSPITAL_COMMUNITY): Payer: Self-pay

## 2016-02-26 DIAGNOSIS — J449 Chronic obstructive pulmonary disease, unspecified: Secondary | ICD-10-CM | POA: Diagnosis not present

## 2016-02-26 DIAGNOSIS — I25119 Atherosclerotic heart disease of native coronary artery with unspecified angina pectoris: Secondary | ICD-10-CM | POA: Diagnosis not present

## 2016-02-26 DIAGNOSIS — I255 Ischemic cardiomyopathy: Secondary | ICD-10-CM | POA: Diagnosis not present

## 2016-02-26 DIAGNOSIS — I249 Acute ischemic heart disease, unspecified: Secondary | ICD-10-CM | POA: Diagnosis not present

## 2016-02-26 LAB — CBC
HEMATOCRIT: 35.9 % — AB (ref 36.0–46.0)
Hemoglobin: 11.9 g/dL — ABNORMAL LOW (ref 12.0–15.0)
MCH: 31.3 pg (ref 26.0–34.0)
MCHC: 33.1 g/dL (ref 30.0–36.0)
MCV: 94.5 fL (ref 78.0–100.0)
PLATELETS: 242 10*3/uL (ref 150–400)
RBC: 3.8 MIL/uL — AB (ref 3.87–5.11)
RDW: 13.1 % (ref 11.5–15.5)
WBC: 8.9 10*3/uL (ref 4.0–10.5)

## 2016-02-26 LAB — MAGNESIUM: Magnesium: 2 mg/dL (ref 1.7–2.4)

## 2016-02-26 LAB — BASIC METABOLIC PANEL
ANION GAP: 7 (ref 5–15)
BUN: 10 mg/dL (ref 6–20)
CALCIUM: 8.6 mg/dL — AB (ref 8.9–10.3)
CO2: 27 mmol/L (ref 22–32)
Chloride: 104 mmol/L (ref 101–111)
Creatinine, Ser: 0.84 mg/dL (ref 0.44–1.00)
Glucose, Bld: 104 mg/dL — ABNORMAL HIGH (ref 65–99)
Potassium: 3.6 mmol/L (ref 3.5–5.1)
Sodium: 138 mmol/L (ref 135–145)

## 2016-02-26 LAB — HEPARIN LEVEL (UNFRACTIONATED): HEPARIN UNFRACTIONATED: 0.39 [IU]/mL (ref 0.30–0.70)

## 2016-02-26 MED ORDER — CLOPIDOGREL BISULFATE 75 MG PO TABS: 75.0000 mg | ORAL_TABLET | Freq: Every day | ORAL | 12 refills | Status: DC

## 2016-02-26 MED ORDER — ASPIRIN EC 81 MG PO TBEC: 81.0000 mg | DELAYED_RELEASE_TABLET | Freq: Every day | ORAL | Status: AC

## 2016-02-26 MED ORDER — CARVEDILOL 12.5 MG PO TABS: 12.5000 mg | ORAL_TABLET | Freq: Two times a day (BID) | ORAL | Status: DC

## 2016-02-26 MED ORDER — CARVEDILOL 12.5 MG PO TABS: 12.5000 mg | ORAL_TABLET | Freq: Two times a day (BID) | ORAL | 12 refills | Status: DC

## 2016-02-26 NOTE — Telephone Encounter (Signed)
I have electronically submitted pt's info for [place] verification and will notify you once I have a response.   She wanted to know if she needs a calcium lab before her injection and requests a c/b.  Thank you!

## 2016-02-26 NOTE — Progress Notes (Addendum)
Patient Name: [name] Date of Encounter: 02/26/2016  Active Problems:   CAD, NATIVE VESSEL   COPD (chronic obstructive pulmonary disease) (HCC)   Ischemic cardiomyopathy   CKD (chronic kidney disease) stage 3, GFR 30-59 ml/min   ACS (acute coronary syndrome) ([place])   Acute coronary syndrome [facility])   Primary Cardiologist: Dr. Burt Knack Patient Profile: [name] is a 75 year old female with a past medical history of CAD (s/p BMS to circumflex in 2010), chronic combined systolic and diastolic CHF, CVA, NSCLCA and CKD stage III. She presented to the ED on 02/24/16 with chest pain, EMS was called, code STEMI activated.  SUBJECTIVE: Feels great, no chest pain or SOB.   OBJECTIVE Vitals:   02/25/16 2154 02/26/16 0212 02/26/16 0503 02/26/16 0512  BP: (!) 110/58   (!) 98/57  Pulse:  70  62  Resp: (!) 23   15  Temp:    98.4 F (36.9 C)  TempSrc:    Oral  SpO2:    92%  Weight:   112 lb 14.4 oz (51.2 kg)   Height:        Intake/Output Summary (Last 24 hours) at 02/26/16 0825 Last data filed at 02/25/16 1700  Gross per 24 hour  Intake              366 ml  Output              300 ml  Net               66 ml   Filed Weights   02/24/16 0331 02/25/16 0600 02/26/16 0503  Weight: 116 lb 2 oz (52.7 kg) 116 lb 10 oz (52.9 kg) 112 lb 14.4 oz (51.2 kg)    PHYSICAL EXAM General: Well developed, well nourished, female in no acute distress. Head: Normocephalic, atraumatic.  Neck: Supple without bruits, no JVD. Lungs:  Resp regular and unlabored, CTA. Heart: RRR, S1, S2, no S3, S4, or murmur; no rub. Abdomen: Soft, non-tender, non-distended, BS + x 4.  Extremities: No clubbing, cyanosis, no edema.  Neuro: Alert and oriented X 3. Moves all extremities spontaneously. Psych: Normal affect.  LABS: CBC: Recent Labs  02/24/16 0335  02/25/16 0156 02/26/16 0328  WBC 8.2  < > 8.2 8.9  NEUTROABS 3.3  --   --   --   HGB 12.4  < > 11.2* 11.9*  HCT 37.8  < > 34.7* 35.9*  MCV 93.8  <  > 95.1 94.5  PLT 309  < > 215 242  < > = values in this interval not displayed. INR: Recent Labs  02/24/16 0356  INR 7.89   Basic Metabolic Panel: Recent Labs  02/25/16 1852 02/26/16 0328  NA 137 138  K 4.1 3.6  CL 101 104  CO2 31 27  GLUCOSE 98 104*  BUN 11 10  CREATININE 0.96 0.84  CALCIUM 8.9 8.6*  MG 2.1 2.0   Liver Function Tests: Recent Labs  02/24/16 0335 02/24/16 0356  AST 18 17  ALT 12* 10*  ALKPHOS 60 53  BILITOT 0.4 0.5  PROT 6.3* 5.8*  ALBUMIN 3.3* 3.0*   Cardiac Enzymes: Recent Labs  02/24/16 0335 02/24/16 0356  CKTOTAL  --  52  CKMB  --  3.5  TROPONINI 0.05*  --    Hemoglobin A1C: Recent Labs  02/24/16 0356  HGBA1C 5.9*   Fasting Lipid Panel: Recent Labs  02/24/16 0356  CHOL 99  HDL 42  LDLCALC 47  TRIG 48  CHOLHDL 2.4     Current Facility-Administered Medications:  .  0.9 %  sodium chloride infusion, 250 mL, Intravenous, PRN, Belva Crome, MD, Last Rate: 10 mL/hr at 02/25/16 1656, 250 mL at 02/25/16 1656 .  acetaminophen (TYLENOL) tablet 650 mg, 650 mg, Oral, Q4H PRN, Belva Crome, MD .  albuterol (PROVENTIL) (2.5 MG/3ML) 0.083% nebulizer solution 2.5 mg, 2.5 mg, Inhalation, Q4H PRN, Belva Crome, MD, 2.5 mg at 02/26/16 0212 .  antiseptic oral rinse (CPC / CETYLPYRIDINIUM CHLORIDE 0.05%) solution 7 mL, 7 mL, Mouth Rinse, BID, Belva Crome, MD, 7 mL at 02/26/16 8786 .  aspirin chewable tablet 81 mg, 81 mg, Oral, Daily, Belva Crome, MD, 81 mg at 02/26/16 7672 .  carvedilol (COREG) tablet 6.25 mg, 6.25 mg, Oral, BID WC, Troy Sine, MD, 6.25 mg at 02/26/16 0947 .  clopidogrel (PLAVIX) tablet 75 mg, 75 mg, Oral, Daily, Belva Crome, MD, 75 mg at 02/26/16 0962 .  furosemide (LASIX) tablet 40 mg, 40 mg, Oral, Daily, Belva Crome, MD, 40 mg at 02/26/16 8366 .  guaiFENesin-dextromethorphan (ROBITUSSIN DM) 100-10 MG/5ML syrup 5 mL, 5 mL, Oral, Q4H PRN, Belva Crome, MD, 5 mL at 02/25/16 0434 .  heparin ADULT infusion 100  units/mL (25000 units/221m sodium chloride 0.45%), 800 Units/hr, Intravenous, Continuous, [facility] Last Rate: 8 mL/hr at 02/25/16 2147, 800 Units/hr at 02/25/16 2147 .  isosorbide mononitrate (IMDUR) 24 hr tablet 30 mg, 30 mg, Oral, Daily, HBelva Crome MD, 30 mg at 02/26/16 02947.  losartan (COZAAR) tablet 50 mg, 50 mg, Oral, Daily, HBelva Crome MD, 50 mg at 02/26/16 06546.  meclizine (ANTIVERT) tablet 25 mg, 25 mg, Oral, TID PRN, HBelva Crome MD .  nitroGLYCERIN (NITROSTAT) SL tablet 0.4 mg, 0.4 mg, Sublingual, Q5 min PRN, HBelva Crome MD .  ondansetron ([facility] injection 4 mg, 4 mg, Intravenous, Q6H PRN, HBelva Crome MD, 4 mg at 02/24/16 0505 .  pantoprazole (PROTONIX) EC tablet 40 mg, 40 mg, Oral, Daily, HBelva Crome MD, 40 mg at 02/26/16 05035.  simvastatin (ZOCOR) tablet 40 mg, 40 mg, Oral, QHS, HBelva Crome MD, 40 mg at 02/25/16 2147 .  sodium chloride flush (NS) 0.9 % injection 3 mL, 3 mL, Intravenous, Q12H, HBelva Crome MD, 3 mL at 02/24/16 2138 .  sodium chloride flush (NS) 0.9 % injection 3 mL, 3 mL, Intravenous, PRN, HBelva Crome MD .  spironolactone (ALDACTONE) tablet 12.5 mg, 12.5 mg, Oral, Daily, HBelva Crome MD, 12.5 mg at 02/26/16 0821 . heparin 800 Units/hr (02/25/16 2147)    TELE:    NSR, had vent trigeminy periodically.       Left Heart Cath and Coronary Angiography 02/24/16  Prox LAD lesion, 65 %stenosed.  Ost 1st Diag lesion, 65 %stenosed.  Prox Cx to Mid Cx lesion, 10 %stenosed.  The left ventricular ejection fraction is 35-45% by visual estimate.  LV end diastolic pressure is normal.  There is moderate left ventricular systolic dysfunction.  Dist LAD lesion, 100 %stenosed.   Acute coronary syndrome with occlusion of distal LAD in a small tortuous segment.  Widely patent stent in the circumflex.  Eccentric 65% stenosis in the mid LAD beyond the first agonal. 60% first diagonal ostial stenosis. Both sites are unchanged from  prior angiogram. New finding of very distal LAD total occlusion.  Dominant right coronary artery with luminal irregularities.  Moderate left ventricular systolic dysfunction with regional wall motion abnormality including new apical severe hypokinesis and suggestion of anterolateral wall akinesis/dyskinesis (old). Estimated ejection fraction is 35%. LVEDP is low normal.   RECOMMENDATIONS:   Serial cardiac markers  Add Plavix to medical regimen.  IV hydration  Risk factor modification  Anticoagulation        Current Medications:  . antiseptic oral rinse  7 mL Mouth Rinse BID  . aspirin  81 mg Oral Daily  . carvedilol  6.25 mg Oral BID WC  . clopidogrel  75 mg Oral Daily  . furosemide  40 mg Oral Daily  . isosorbide mononitrate  30 mg Oral Daily  . losartan  50 mg Oral Daily  . pantoprazole  40 mg Oral Daily  . simvastatin  40 mg Oral QHS  . sodium chloride flush  3 mL Intravenous Q12H  . spironolactone  12.5 mg Oral Daily   . heparin 800 Units/hr (02/25/16 2147)    ASSESSMENT AND PLAN: Active Problems:   CAD, NATIVE VESSEL   COPD (chronic obstructive pulmonary disease) (HCC)   Ischemic cardiomyopathy   CKD (chronic kidney disease) stage 3, GFR 30-59 ml/min   ACS (acute coronary syndrome) (HCC)   Acute coronary syndrome ([place])  1. CAD: Presented as code STEMI, cath report above. Her previous stent to Circumflex is patent. There is a new very distal LAD occlusion that was not amendable to PCI. Plavix was added to her medication regimen. She is on long acting nitrates. Chest pain free. Coreg titrated up to 6.'25mg'$  BID yesterday, tolerating well.   2. Ischemic cardiomyopathy: EF estimated at 35% by cath with new severe apical hypokinesis. Continue Coreg and ARB. Arlyce Harman added. She is on ARB. LVEDP was not elevated.   3. CKD stage III: Stable, Cr 0.95. GFR is 57.   4. Mild anemia: Stable.     Signed, Arbutus Leas , NP 8:25 AM   02/26/2016 Pager  [phone]   Patient seen and examined. Agree with assessment and plan. No chest pain or wheezing. Occasional PVC's , HR in the 70's. Will further titrate carvedilol to 12.5 mg bid.  Pt is wanting to go home. If she develops wheezing on higher dose consider changing to bisoprolol as outpatient.  Ambulate this am and probable dc this afternoon. Continue DAPT.     Troy Sine, MD, [facility] 02/26/2016 9:23 AM

## 2016-02-26 NOTE — Care Management Important Message (Signed)
Important Message  Patient Details  Name: [name] MRN: [id number] Date of Birth: 1941-03-08   Medicare Important Message Given:  Yes    Nathen May 02/26/2016, 10:55 AM

## 2016-02-26 NOTE — Progress Notes (Signed)
[place] for heparin Indication: chest pain/ACS  Allergies  Allergen Reactions  . Actonel [Risedronate Sodium] Other (See Comments)    Headache  . Amlodipine Other (See Comments)    Pedal edema  . Fosamax [Alendronate Sodium] Other (See Comments)    Unable to tolerate  . Lisinopril Cough  . Pravastatin Other (See Comments)    Constipation.  . Codeine Rash    Patient Measurements: Height: '5\' 6"'$  (167.6 cm) Weight: 112 lb 14.4 oz (51.2 kg) IBW/kg (Calculated) : 59.3  Vital Signs: Temp: 98.4 F (36.9 C) (08/02 0512) Temp Source: Oral (08/02 0512) BP: 98/57 (08/02 0512) Pulse Rate: 62 (08/02 0512)  Labs:  Recent Labs  02/24/16 0335 02/24/16 0356  02/24/16 0945  02/25/16 0156 02/25/16 1122 02/25/16 1852 02/26/16 0328  HGB 12.4 11.4*  < > 11.7*  --  11.2*  --   --  11.9*  HCT 37.8 34.9*  < > 36.1  --  34.7*  --   --  35.9*  PLT 309 264  --  241  --  215  --   --  242  APTT 33 >200*  --   --   --   --   --   --   --   LABPROT 13.6 15.9*  --   --   --   --   --   --   --   INR 1.04 1.26  --   --   --   --   --   --   --   HEPARINUNFRC  --   --   --   --   < > 0.11* 0.37 0.35 0.39  CREATININE 0.96 0.95  < > 0.95  --   --   --  0.96 0.84  CKTOTAL  --  52  --   --   --   --   --   --   --   CKMB  --  3.5  --   --   --   --   --   --   --   TROPONINI 0.05*  --   --   --   --   --   --   --   --   < > = values in this interval not displayed.  Estimated Creatinine Clearance: 46.8 mL/min (by C-G formula based on SCr of 0.84 mg/dL).   Assessment: 75yo female presents as code STEMI s/p cath 7/31, admitted w/ acute anterolateral infarction w/ occlusion of very distal LAD. Heparin restarted 8hr after sheath removal. HL remains therapeutic today at 0.39. Hgb low but stable, plt wnl, no bleed issues.  Goal of Therapy:  Heparin level 0.3-0.7 units/ml Monitor platelets by anticoagulation protocol: Yes   Plan:  Continue Heparin at 800  units/hr Daily HL/CBC Monitor s/sx bleeding  Stephens November, PharmD Clinical Pharmacist 8:00 AM, 02/26/2016

## 2016-02-26 NOTE — Discharge Summary (Signed)
Discharge Summary    Patient ID: [name],  MRN: [id number], DOB/AGE: 03/20/1941 75 y.o.  Admit date: 02/24/2016 Discharge date: 02/26/2016  Primary Care Provider: Ria Bush Primary Cardiologist: Dr. Burt Knack  Discharge Diagnoses    Active Problems:   CAD, NATIVE VESSEL   COPD (chronic obstructive pulmonary disease) (HCC)   Ischemic cardiomyopathy   CKD (chronic kidney disease) stage 3, GFR 30-59 ml/min   ACS (acute coronary syndrome) (HCC)   Acute coronary syndrome (HCC)   Allergies Allergies  Allergen Reactions  . Actonel [Risedronate Sodium] Other (See Comments)    Headache  . Amlodipine Other (See Comments)    Pedal edema  . Fosamax [Alendronate Sodium] Other (See Comments)    Unable to tolerate  . Lisinopril Cough  . Pravastatin Other (See Comments)    Constipation.  . Codeine Rash    Diagnostic Studies/Procedures  Left Heart Cath and Coronary Angiography 02/24/16  Prox LAD lesion, 65 %stenosed.  Ost 1st Diag lesion, 65 %stenosed.  Prox Cx to Mid Cx lesion, 10 %stenosed.  The left ventricular ejection fraction is 35-45% by visual estimate.  LV end diastolic pressure is normal.  There is moderate left ventricular systolic dysfunction.  Dist LAD lesion, 100 %stenosed.    Acute coronary syndrome with occlusion of distal LAD in a small tortuous segment.  Widely patent stent in the circumflex.  Eccentric 65% stenosis in the mid LAD beyond the first agonal. 60% first diagonal ostial stenosis. Both sites are unchanged from prior angiogram. New finding of very distal LAD total occlusion.  Dominant right coronary artery with luminal irregularities.  Moderate left ventricular systolic dysfunction with regional wall motion abnormality including new apical severe hypokinesis and suggestion of anterolateral wall akinesis/dyskinesis (old). Estimated ejection fraction is 35%.  LVEDP is low normal.  RECOMMENDATIONS:   Serial cardiac  markers  Add Plavix to medical regimen.  IV hydration  Risk factor modification  Anticoagulation  _____________   History of Present Illness   [name] is a 75 year old female with a past medical history of HTN, HLD, ischemic cardiomyopathy (EF 35% by cath this admission) , CAD (BMS to circumflex in 2010),and chronic combined systolic and diastolic CHF. She presented to the ED on 02/24/16 with chest pain that awakened her from sleep. She called EMS and EKG showed ST elevation in lateral leads. Code STEMI was called and she was taken urgently to the cath lab.   Hospital Course  Left heart cath report above. She has a new, very distal stenosis in her LAD. This was not amendable to PCI. Plavix was added to her medication regimen. She will continue dual antiplatelet therapy indefinitely.  Her EF was 35% by cath, spironolactone was added and her Coreg was titrated up to 12.'5mg'$  BID. She had some periods of ventricular trigeminy during her hospitalization. She has a history of COPD. If she develops wheezing on her increased dose of Coreg we can switch her to bisoprolol.   Her last echo was in 2013. At that time she had an EF of 45-50% with akinesis of the posterior wall. Her EF was estimated at 35% by cath this admission. Would recommend getting an echo outpatient.  She will continue on 30 mg of isosorbide to prevent further angina. Her creatinine was stable throughout admission as she does carry a diagnosis of chronic kidney disease stage III.  She ambulated and felt well. She will be seen by outpatient cardiac rehabilitation.  She was seen today  by Dr. Claiborne Billings and deemed suitable for discharge.  __________  Discharge Vitals Blood pressure (!) 98/57, pulse 62, temperature 98.4 F (36.9 C), temperature source Oral, resp. rate 15, height '5\' 6"'$  (1.676 m), weight 112 lb 14.4 oz (51.2 kg), SpO2 92 %.  Filed Weights   02/24/16 0331 02/25/16 0600 02/26/16 0503  Weight: 116 lb 2 oz (52.7 kg) 116  lb 10 oz (52.9 kg) 112 lb 14.4 oz (51.2 kg)    Labs & Radiologic Studies     CBC  Recent Labs  02/24/16 0335  02/25/16 0156 02/26/16 0328  WBC 8.2  < > 8.2 8.9  NEUTROABS 3.3  --   --   --   HGB 12.4  < > 11.2* 11.9*  HCT 37.8  < > 34.7* 35.9*  MCV 93.8  < > 95.1 94.5  PLT 309  < > 215 242  < > = values in this interval not displayed. Basic Metabolic Panel  Recent Labs  02/25/16 1852 02/26/16 0328  NA 137 138  K 4.1 3.6  CL 101 104  CO2 31 27  GLUCOSE 98 104*  BUN 11 10  CREATININE 0.96 0.84  CALCIUM 8.9 8.6*  MG 2.1 2.0   Liver Function Tests  Recent Labs  02/24/16 0335 02/24/16 0356  AST 18 17  ALT 12* 10*  ALKPHOS 60 53  BILITOT 0.4 0.5  PROT 6.3* 5.8*  ALBUMIN 3.3* 3.0*   Cardiac Enzymes  Recent Labs  02/24/16 0335 02/24/16 0356  CKTOTAL  --  52  CKMB  --  3.5  TROPONINI 0.05*  --    Hemoglobin A1C  Recent Labs  02/24/16 0356  HGBA1C 5.9*   Fasting Lipid Panel  Recent Labs  02/24/16 0356  CHOL 99  HDL 42  LDLCALC 47  TRIG 48  CHOLHDL 2.4    Disposition   Pt is being discharged home today in good condition.  Follow-up Plans & Appointments    Follow-up Information    Lyda Jester, PA-C Follow up on 03/12/2016.   Specialties:  Cardiology, Radiology Why:  at 9 am for hospital follow up  Contact information: [place] [address] [phone]          Discharge Instructions    Amb Referral to Cardiac Rehabilitation    Complete by:  As directed   Diagnosis:  STEMI   Diet - low sodium heart healthy    Complete by:  As directed   Increase activity slowly    Complete by:  As directed      Discharge Medications   Current Discharge Medication List    START taking these medications   Details  aspirin EC 81 MG tablet Take 1 tablet (81 mg total) by mouth daily.    clopidogrel (PLAVIX) 75 MG tablet Take 1 tablet (75 mg total) by mouth daily. Qty: 30 tablet, Refills: 12      CONTINUE  these medications which have CHANGED   Details  carvedilol (COREG) 12.5 MG tablet Take 1 tablet (12.5 mg total) by mouth 2 (two) times daily with a meal. Qty: 60 tablet, Refills: 12      CONTINUE these medications which have NOT CHANGED   Details  albuterol (PROAIR HFA) 108 (90 BASE) MCG/ACT inhaler Inhale 2 puffs into the lungs every 4 (four) hours as needed for wheezing or shortness of breath. Qty: 1 Inhaler, Refills: 1    Calcium Carbonate-Vitamin D (CALCIUM-VITAMIN D) 500-200 MG-UNIT per tablet Take  2 tablets by mouth daily.    Cholecalciferol (VITAMIN D) 2000 UNITS CAPS Take 1 capsule by mouth daily.    denosumab (PROLIA) 60 MG/ML SOLN injection Inject 60 mg into the skin every 6 (six) months. Administer in upper arm, thigh, or abdomen Qty: 60 mL, Refills: 1    furosemide (LASIX) 40 MG tablet TAKE 1 TABLET BY MOUTH DAILY Qty: 30 tablet, Refills: 11    isosorbide mononitrate (IMDUR) 30 MG 24 hr tablet TAKE 1 TABLET BY MOUTH DAILY Qty: 30 tablet, Refills: 3    losartan (COZAAR) 50 MG tablet TAKE 1 TABLET BY MOUTH DAILY Qty: 30 tablet, Refills: 11    LOTEMAX 0.5 % ophthalmic suspension Place 1 drop into both eyes daily.     nitroGLYCERIN (NITROSTAT) 0.4 MG SL tablet Place 1 tablet (0.4 mg total) under the tongue every 5 (five) minutes as needed for chest pain. Qty: 25 tablet, Refills: 3   Associated Diagnoses: Atherosclerosis of native coronary artery of native heart without angina pectoris    omeprazole (PRILOSEC) 20 MG capsule TAKE ONE CAPSULE BY MOUTH DAILY Qty: 30 capsule, Refills: 11    simvastatin (ZOCOR) 40 MG tablet TAKE ONE TABLET BY MOUTH AT BEDTIME Qty: 30 tablet, Refills: 11    spironolactone (ALDACTONE) 25 MG tablet TAKE 1/2  TABLET BY MOUTH ONCE DAILY. Qty: 30 tablet, Refills: 6      STOP taking these medications     aspirin 325 MG tablet      meclizine (ANTIVERT) 25 MG tablet          Aspirin prescribed at discharge? Yes High Intensity Statin  Prescribed?Yes Beta Blocker Prescribed? Yes For EF 40% or less, Was ACEI/ARB Prescribed? Yes ADP Receptor Inhibitor Prescribed? Yes For EF <40%, Aldosterone Inhibitor Prescribed?Yes Was EF assessed during THIS hospitalization?Yes, by cath Was Cardiac Rehab II ordered? Yes Outstanding Labs/Studies    Duration of Discharge Encounter   Greater than 30 minutes including physician time.  Signed, Arbutus Leas NP 02/26/2016, 1:38 PM

## 2016-02-26 NOTE — Discharge Instructions (Signed)
Cath Site Care Refer to this sheet in the next few weeks. These instructions provide you with information about caring for yourself after your procedure. Your health care provider may also give you more specific instructions. Your treatment has been planned according to current medical practices, but problems sometimes occur. Call your health care provider if you have any problems or questions after your procedure. WHAT TO EXPECT AFTER THE PROCEDURE After your procedure, it is typical to have the following:  Bruising at the cath site that usually fades within 1-2 weeks.  Blood collecting in the tissue (hematoma) that may be painful to the touch. It should usually decrease in size and tenderness within 1-2 weeks. HOME CARE INSTRUCTIONS  Take medicines only as directed by your health care provider.  You may shower 24-48 hours after the procedure or as directed by your health care provider. Remove the bandage (dressing) and gently wash the site with plain soap and water. Pat the area dry with a clean towel. Do not rub the site, because this may cause bleeding.  Do not take baths, swim, or use a hot tub until your health care provider approves.  Check your insertion site every day for redness, swelling, or drainage.  Do not apply powder or lotion to the site.  Do not flex or bend the affected arm for 24 hours or as directed by your health care provider.  Do not push or pull heavy objects with the affected arm for 24 hours or as directed by your health care provider.  Do not lift over 10 lb (4.5 kg) for 5 days after your procedure or as directed by your health care provider.  Ask your health care provider when it is okay to:  Return to work or school.  Resume usual physical activities or sports.  Resume sexual activity.  Do not drive home if you are discharged the same day as the procedure. Have someone else drive you.  You may drive 24 hours after the procedure unless otherwise  instructed by your health care provider.  Do not operate machinery or power tools for 24 hours after the procedure.  If your procedure was done as an outpatient procedure, which means that you went home the same day as your procedure, a responsible adult should be with you for the first 24 hours after you arrive home.  Keep all follow-up visits as directed by your health care provider. This is important. SEEK MEDICAL CARE IF:  You have a fever.  You have chills.  You have increased bleeding from the radial site. Hold pressure on the site. SEEK IMMEDIATE MEDICAL CARE IF:  You have unusual pain at the radial site.  You have redness, warmth, or swelling at the radial site.  You have drainage (other than a small amount of blood on the dressing) from the radial site.  The radial site is bleeding, and the bleeding does not stop after 30 minutes of holding steady pressure on the site.  Your arm or hand becomes pale, cool, tingly, or numb.   This information is not intended to replace advice given to you by your health care provider. Make sure you discuss any questions you have with your health care provider.   Document Released: 08/15/2010 Document Revised: 08/03/2014 Document Reviewed: 01/29/2014 Elsevier Interactive Patient Education [employer].

## 2016-02-26 NOTE — Progress Notes (Signed)
RACARDIAC REHAB PHASE I   PRE:  Rate/Rhythm: 76 SR c/ PVCs  BP:  Sitting: 145/67        SaO2: 94 RA  MODE:  Ambulation: 500 ft   POST:  Rate/Rhythm: 77 SR c/ PVCs  BP:  Sitting: 114/98         SaO2: 96 RA  Pt ambulated 500 ft on RA, handheld assist, fairly steady gait, tolerated well.  Pt c/o mild DOE, states it is much improved, denies cp, dizziness, declined rest stop. Completed MI education with pt and pt's best friend at bedside.  Reviewed risk factors, MI book, anti-platelet therapy,  activity restrictions, ntg, exercise, heart healthy diet, sodium restrictions, CHF booklet and zone tool, daily weights and phase 2 cardiac rehab. Pt verbalized understanding. Pt agrees to phase 2 cardiac rehab referral, will send to [facility] per pt request. Pt to chair after walk, call bell within reach. Pt hopeful for discharge today.  [place], RN, BSN 02/26/2016 12:23 PM

## 2016-02-27 ENCOUNTER — Telehealth: Payer: Self-pay | Admitting: Cardiovascular Disease

## 2016-02-27 MED ORDER — PANTOPRAZOLE SODIUM 40 MG PO TBEC: 40.0000 mg | DELAYED_RELEASE_TABLET | Freq: Every day | ORAL | 11 refills | Status: DC

## 2016-02-27 NOTE — Telephone Encounter (Signed)
New message    Pt c/o medication issue:  1. Name of Medication: Omeprazole  2. How are you currently taking this medication (dosage and times per day)? 20 mg po daily  3. Are you having a reaction (difficulty breathing--STAT)? no  4. What is your medication issue? The son spoke with the pharmacist and told him that the Plavix could be reduced and the pt is confused . Not sure what's going on but the pt wants to get everything straight

## 2016-02-27 NOTE — Telephone Encounter (Signed)
Patient had calcium level drawn yesterday at hospital, so not needed. Patient advised.

## 2016-02-27 NOTE — Telephone Encounter (Signed)
Chart reviewed and plavix was recently added to the pt's medication regimen during hospitalization.  The pt is also taking Prilosec and the pharmacy made the pt aware of interaction. Discussed with Dr Burt Knack and he recommends stopping prilosec and starting Pantoprazole '40mg'$  take one tablet by mouth daily. I spoke with the pt and made her aware of recommended change in medication. Rx sent to the pharmacy.

## 2016-03-02 ENCOUNTER — Telehealth: Payer: Self-pay | Admitting: Family Medicine

## 2016-03-02 ENCOUNTER — Ambulatory Visit (INDEPENDENT_AMBULATORY_CARE_PROVIDER_SITE_OTHER): Payer: PPO | Admitting: Family Medicine

## 2016-03-02 ENCOUNTER — Telehealth: Payer: Self-pay | Admitting: Cardiovascular Disease

## 2016-03-02 DIAGNOSIS — M545 Low back pain, unspecified: Secondary | ICD-10-CM

## 2016-03-02 DIAGNOSIS — M79605 Pain in left leg: Principal | ICD-10-CM

## 2016-03-02 NOTE — Telephone Encounter (Signed)
[facility]  Patient Name: [name]  DOB: 04-May-1941    Initial Comment Caller states she had a heart attack last week. Her leg is getting numb.   Nurse Assessment  Nurse: Wynetta Emery, RN, Baker Janus Date/Time Eilene Ghazi Time): 03/02/2016 8:56:23 AM  Confirm and document reason for call. If symptomatic, describe symptoms. You must click the next button to save text entered. ---[name] had MI last week was hospitalized discharged on Wednesday -- presents today from hip down leg down was hurting (left leg) called EMT they told her to call the MD called the cardiologist who told her to call PCP --- now became numb hurting from knee down.  Has the patient traveled out of the country within the last 30 days? ---No  Does the patient have any new or worsening symptoms? ---Yes  Will a triage be completed? ---Yes  Related visit to physician within the last 2 weeks? ---No  Does the PT have any chronic conditions? (i.e. diabetes, asthma, etc.) ---Unknown  Is this a behavioral health or substance abuse call? ---No     Guidelines    Guideline Title Affirmed Question Affirmed Notes  Leg Pain [1] Thigh or calf pain AND [2] only 1 side AND [3] present > 1 hour    Final Disposition User   See Physician within 4 Hours (or PCP triage) Wynetta Emery, RN, Baker Janus    Comments  NOTE [phone] 1030 AM WITH Dr. Deborra Medina c/o leg numbness and pain recent MI NO AVAILABLE APPT WITH DR. Leo Grosser   Referrals  REFERRED TO PCP OFFICE   Disagree/Comply: Comply

## 2016-03-02 NOTE — Telephone Encounter (Signed)
New Message  Pt calling stating No SOB or Chest Pain.  Pt stating she's having pain in her legs but no swelling.  Legs keeps getting numb.  Please follow up with pt.

## 2016-03-02 NOTE — Progress Notes (Signed)
Subjective:   Patient ID: [name], female    DOB: Jan 17, 1941, 75 y.o.   MRN: [id number]  [name] is a pleasant 75 y.o. year old female pt of Dr. Darnell Level, new to me, who presents to clinic today with her husband for Leg Pain (left)  on 03/02/2016  HPI:  Discharged from hospital last week (8/2), s/p MI, cardiac cath.  Notes reviewed.  Medications adjusted- spironolactone was added and her coreg dose was increased. Plavix added.  Advised to continue isosorbide for angina.  She has had no further chest pain.  Chronic SOB- has COPD.  Has been sleeping in a recliner since she was discharged from the hospital because of her COPD- easier to breath.  Woke up with pain in her left  lower back that radiated down her left leg.  Also associated with an ache and tingling. Cath was placed in right groin, not left.  Walking seemed to make it worse this morning.  Pain has since resolved.  Current Outpatient Prescriptions on File Prior to Visit  Medication Sig Dispense Refill  . albuterol (PROAIR HFA) 108 (90 BASE) MCG/ACT inhaler Inhale 2 puffs into the lungs every 4 (four) hours as needed for wheezing or shortness of breath. 1 Inhaler 1  . aspirin EC 81 MG tablet Take 1 tablet (81 mg total) by mouth daily.    . Calcium Carbonate-Vitamin D (CALCIUM-VITAMIN D) 500-200 MG-UNIT per tablet Take 2 tablets by mouth daily.    . carvedilol (COREG) 12.5 MG tablet Take 1 tablet (12.5 mg total) by mouth 2 (two) times daily with a meal. 60 tablet 12  . Cholecalciferol (VITAMIN D) 2000 UNITS CAPS Take 1 capsule by mouth daily.    . clopidogrel (PLAVIX) 75 MG tablet Take 1 tablet (75 mg total) by mouth daily. 30 tablet 12  . denosumab (PROLIA) 60 MG/ML SOLN injection Inject 60 mg into the skin every 6 (six) months. Administer in upper arm, thigh, or abdomen 60 mL 1  . furosemide (LASIX) 40 MG tablet TAKE 1 TABLET BY MOUTH DAILY 30 tablet 11  . isosorbide mononitrate (IMDUR) 30 MG 24 hr tablet TAKE 1  TABLET BY MOUTH DAILY 30 tablet 3  . losartan (COZAAR) 50 MG tablet TAKE 1 TABLET BY MOUTH DAILY 30 tablet 11  . LOTEMAX 0.5 % ophthalmic suspension Place 1 drop into both eyes daily.     . nitroGLYCERIN (NITROSTAT) 0.4 MG SL tablet Place 1 tablet (0.4 mg total) under the tongue every 5 (five) minutes as needed for chest pain. 25 tablet 3  . pantoprazole (PROTONIX) 40 MG tablet Take 1 tablet (40 mg total) by mouth daily. 30 tablet 11  . simvastatin (ZOCOR) 40 MG tablet TAKE ONE TABLET BY MOUTH AT BEDTIME 30 tablet 11  . spironolactone (ALDACTONE) 25 MG tablet TAKE 1/2  TABLET BY MOUTH ONCE DAILY. 30 tablet 6   No current facility-administered medications on file prior to visit.     Allergies  Allergen Reactions  . Actonel [Risedronate Sodium] Other (See Comments)    Headache  . Amlodipine Other (See Comments)    Pedal edema  . Fosamax [Alendronate Sodium] Other (See Comments)    Unable to tolerate  . Lisinopril Cough  . Pravastatin Other (See Comments)    Constipation.  . Codeine Rash    Past Medical History:  Diagnosis Date  . Arthritis   . CAD (coronary artery disease) 03/2009   s/p MI  . Chronic combined systolic and diastolic  heart failure ([place]) 06/17/2009   Qualifier: Diagnosis of  By: Burt Knack, MD, Clayburn Pert   . CKD (chronic kidney disease) stage 3, GFR 30-59 ml/min   . Emphysema/COPD ([place])   . Ex-smoker quit 2008  . GERD (gastroesophageal reflux disease)   . Helicobacter pylori gastritis 10/2007   treated  . History of CVA (cerebrovascular accident) without residual deficits 01/2012, 06/2012   R hemorrhagic MCA 01/2012 with remote lacunar infarct L putamen and IC, rpt 06/2012 acute multifocal R MCA infarct with remote hemorrhagic strokes affecting L basal ganglia and periventricular white matter, full recovery  . HLD (hyperlipidemia)   . HTN (hypertension)   . Ischemic cardiomyopathy 02/27/2014  . Lung cancer ([place]) dx'd 07/2006   Lung CA, s/p resection, followed by Dr  Earlie Server  . Macular degeneration   . Myocardial infarction [facility]) 03/2009   Acute myocardial infarction 2010 - treated with BMS of LCx. LVEF 50% with subsequent CHF  . Osteoporosis 09/2013   T -3.6 forearm 09/2013, T -4.5 forearm 04/2015    Past Surgical History:  Procedure Laterality Date  . BREAST BIOPSY Left   . CARDIAC CATHETERIZATION N/A 02/24/2016   Procedure: Left Heart Cath and Coronary Angiography;  Surgeon: Belva Crome, MD;  Location: [facility];  Service: Cardiovascular;  Laterality: N/A;  . CATARACT EXTRACTION Bilateral   . LUNG REMOVAL, PARTIAL Right 2008  . PARTIAL HYSTERECTOMY  1986   irregular periods, ovaries remain    Family History  Problem Relation Age of Onset  . CAD Mother 51    MI  . Stroke Mother   . Hypertension Mother   . Heart attack Mother   . CAD Father 55  . Stroke Father   . Breast cancer Sister   . Diabetes Sister     Social History   Social History  . Marital status: Widowed    Spouse name: N/A  . Number of children: 1  . Years of education: N/A   Occupational History  . retired     Insurance underwriter   Social History Main Topics  . Smoking status: Former Smoker    Packs/day: 2.00    Years: 40.00    Types: Cigarettes    Quit date: 07/27/2006  . Smokeless tobacco: Never Used  . Alcohol use No  . Drug use: No  . Sexual activity: No   Other Topics Concern  . Not on file   Social History Narrative   The patient is a widow and has one son and one dog.     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  She does not drink alcohol. 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   Ed: HS   Activity: no regular exercise   Diet: some water, some fruits/vegetables    Lives in a one story home.    The PMH, PSH, Social History, Family History, Medications, and allergies have been reviewed in [facility], and have been updated if relevant.   Review of Systems  Constitutional: Negative.     Respiratory: Positive for shortness of breath. Negative for wheezing.   Cardiovascular: Negative for chest pain, palpitations and leg swelling.  Musculoskeletal: Positive for back pain.  Neurological: Negative.   All other systems reviewed and are negative.      Objective:    BP 105/65   Pulse (!) 51   Temp 97.4 F (36.3 C) (Oral)   Wt  114 lb 12 oz (52.1 kg)   SpO2 94%   BMI 18.52 kg/m    Physical Exam  Constitutional: She is oriented to person, place, and time. She appears well-developed and well-nourished. No distress.  HENT:  Head: Normocephalic and atraumatic.  Eyes: Conjunctivae are normal.  Cardiovascular: Normal rate.   Pulmonary/Chest: Effort normal.  Musculoskeletal:       Lumbar back: She exhibits normal range of motion, no tenderness and no bony tenderness.  +/- SLR left  Neurological: She is alert and oriented to person, place, and time. No cranial nerve deficit.  Skin: Skin is warm and dry. She is not diaphoretic.  Psychiatric: She has a normal mood and affect. Her behavior is normal. Judgment and thought content normal.  Nursing note and vitals reviewed.         Assessment & Plan:   Lumbar pain with radiation down left leg No Follow-up on file.

## 2016-03-02 NOTE — Telephone Encounter (Signed)
I reviewed with Dr Curt Bears-  Per Dr Camnitz--recommend follow up with PCP for further evaluation of left leg/hip/knee pain.  Pt advised per Dr Curt Bears follow up with PCP for further evaluation of left leg/knee/hip pain. Pt verbalized understanding, agreed with plan.

## 2016-03-02 NOTE — Patient Instructions (Signed)
Great to see you. Please drink more fluids. Keep [place] updated.

## 2016-03-02 NOTE — Telephone Encounter (Signed)
New Message  Pt calling because

## 2016-03-02 NOTE — Telephone Encounter (Signed)
This TH note did not come to my desk top but was found on portal; pt has already seen Dr Deborra Medina today.

## 2016-03-02 NOTE — Telephone Encounter (Signed)
Pt states left leg has been numb off and on since about 6 AM.  Pt states she woke about 6 AM with pain/numbness from left hip down leg, pain stopped about 7 AM.  Pt states now pain and numbness from left knee down leg. Pt denies any swelling, discoloration, or injury to left leg/knee/hip. Pt states she can put weight on her left leg but it makes the pain worse.  Pt states EMS came and checked left leg earlier this AM and was told her leg was not hot, like a blood clot, they could not find a reason for her left leg pain.

## 2016-03-02 NOTE — Assessment & Plan Note (Signed)
New- intermittent symptoms. Likely pinched sciatic nerve from sleeping in the recliner. Discussed placing a pillow that is alternated between sides if she needs to remain in the recliner and adjust her foot height. Call or return to clinic prn if these symptoms worsen or fail to improve as anticipated. The patient indicates understanding of these issues and agrees with the plan.

## 2016-03-05 DIAGNOSIS — H26493 Other secondary cataract, bilateral: Secondary | ICD-10-CM | POA: Diagnosis not present

## 2016-03-05 DIAGNOSIS — H1851 Endothelial corneal dystrophy: Secondary | ICD-10-CM | POA: Diagnosis not present

## 2016-03-09 ENCOUNTER — Encounter: Payer: PPO | Attending: Cardiovascular Disease | Admitting: *Deleted

## 2016-03-09 ENCOUNTER — Encounter: Payer: Self-pay | Admitting: *Deleted

## 2016-03-09 VITALS — Ht 66.5 in | Wt 115.4 lb

## 2016-03-09 DIAGNOSIS — I2109 ST elevation (STEMI) myocardial infarction involving other coronary artery of anterior wall: Secondary | ICD-10-CM | POA: Insufficient documentation

## 2016-03-09 DIAGNOSIS — I2102 ST elevation (STEMI) myocardial infarction involving left anterior descending coronary artery: Secondary | ICD-10-CM

## 2016-03-09 NOTE — Patient Instructions (Signed)
Patient Instructions  Patient Details  Name: [name] MRN: [id number] Date of Birth: 07/26/1941 Referring Provider:  Sherren Mocha, MD  Below are the personal goals you chose as well as exercise and nutrition goals. Our goal is to help you keep on track towards obtaining and maintaining your goals. We will be discussing your progress on these goals with you throughout the program.  Initial Exercise Prescription:     Initial Exercise Prescription - 03/09/16 1300      Date of Initial Exercise RX and Referring Provider   Date 03/09/16   Referring Provider Sherren Mocha MD     Treadmill   MPH 1.6   Grade 0.5   Minutes 15   METs 2.34     NuStep   Level 1  60-80 spm   Minutes 15   METs 2     Arm/Foot Ergometer   Level 1  25-40 rpm   Minutes 15   METs 2     Prescription Details   Frequency (times per week) 3   Duration Progress to 45 minutes of aerobic exercise without signs/symptoms of physical distress     Intensity   THRR 40-80% of Max Heartrate 93-128   Ratings of Perceived Exertion 11-15   Perceived Dyspnea 0-4     Progression   Progression Continue to progress workloads to maintain intensity without signs/symptoms of physical distress.     Resistance Training   Training Prescription Yes   Weight 2 lbs   Reps 10-15      Exercise Goals: Frequency: Be able to perform aerobic exercise three times per week working toward 3-5 days per week.  Intensity: Work with a perceived exertion of 11 (fairly light) - 15 (hard) as tolerated. Follow your new exercise prescription and watch for changes in prescription as you progress with the program. Changes will be reviewed with you when they are made.  Duration: You should be able to do 30 minutes of continuous aerobic exercise in addition to a 5 minute warm-up and a 5 minute cool-down routine.  Nutrition Goals: Your personal nutrition goals will be established when you do your nutrition analysis with the  dietician.  The following are nutrition guidelines to follow: Cholesterol < '200mg'$ /day Sodium < '1500mg'$ /day Fiber: Women over 50 yrs - 21 grams per day  Personal Goals:     Personal Goals and Risk Factors at Admission - 03/09/16 1527      Core Components/Risk Factors/Patient Goals on Admission    Weight Management Weight Gain  [name] does not want to lose any weight.  She would like to gain a few pounds.     Sedentary Yes   Intervention Provide advice, education, support and counseling about physical activity/exercise needs.;Develop an individualized exercise prescription for aerobic and resistive training based on initial evaluation findings, risk stratification, comorbidities and participant's personal goals.   Expected Outcomes Achievement of increased cardiorespiratory fitness and enhanced flexibility, muscular endurance and strength shown through measurements of functional capacity and personal statement of participant.   Increase Strength and Stamina Yes   Intervention Provide advice, education, support and counseling about physical activity/exercise needs.;Develop an individualized exercise prescription for aerobic and resistive training based on initial evaluation findings, risk stratification, comorbidities and participant's personal goals.   Expected Outcomes Achievement of increased cardiorespiratory fitness and enhanced flexibility, muscular endurance and strength shown through measurements of functional capacity and personal statement of participant.   Improve shortness of breath with ADL's Yes   Intervention Provide education, individualized  exercise plan and daily activity instruction to help decrease symptoms of SOB with activities of daily living.   Expected Outcomes Short Term: Achieves a reduction of symptoms when performing activities of daily living.   Hypertension Yes   Intervention Provide education on lifestyle modifcations including regular physical activity/exercise,  weight management, moderate sodium restriction and increased consumption of fresh fruit, vegetables, and low fat dairy, alcohol moderation, and smoking cessation.;Monitor prescription use compliance.   Expected Outcomes Short Term: Continued assessment and intervention until BP is < 140/66m HG in hypertensive participants. < 130/872mHG in hypertensive participants with diabetes, heart failure or chronic kidney disease.;Long Term: Maintenance of blood pressure at goal levels.   Lipids Yes   Intervention Provide education and support for participant on nutrition & aerobic/resistive exercise along with prescribed medications to achieve LDL '70mg'$ , HDL >'40mg'$ .   Expected Outcomes Short Term: Participant states understanding of desired cholesterol values and is compliant with medications prescribed. Participant is following exercise prescription and nutrition guidelines.;Long Term: Cholesterol controlled with medications as prescribed, with individualized exercise RX and with personalized nutrition plan. Value goals: LDL < '70mg'$ , HDL > 40 mg.      Tobacco Use Initial Evaluation: History  Smoking Status  . Former Smoker  . Packs/day: 2.00  . Years: 40.00  . Types: Cigarettes  . Quit date: 07/27/2006  Smokeless Tobacco  . Never Used    Copy of goals given to participant.

## 2016-03-09 NOTE — Progress Notes (Signed)
Cardiac Individual Treatment Plan  Patient Details  Name: [name] MRN: [id number] Date of Birth: 1941/02/14 Referring Provider:   Flowsheet Row Cardiac Rehab from 03/09/2016 in [facility] Cardiac and Pulmonary Rehab  Referring Provider  Sherren Mocha MD      Initial Encounter Date:  Flowsheet Row Cardiac Rehab from 03/09/2016 in [facility] Cardiac and Pulmonary Rehab  Date  03/09/16  Referring Provider  Sherren Mocha MD      Visit Diagnosis: ST elevation myocardial infarction involving left anterior descending (LAD) coronary artery ([place])  Patient's Home Medications on Admission:  Current Outpatient Prescriptions:  .  albuterol (PROAIR HFA) 108 (90 BASE) MCG/ACT inhaler, Inhale 2 puffs into the lungs every 4 (four) hours as needed for wheezing or shortness of breath., Disp: 1 Inhaler, Rfl: 1 .  aspirin EC 81 MG tablet, Take 1 tablet (81 mg total) by mouth daily., Disp: , Rfl:  .  Calcium Carbonate-Vitamin D (CALCIUM-VITAMIN D) 500-200 MG-UNIT per tablet, Take 2 tablets by mouth daily., Disp: , Rfl:  .  carvedilol (COREG) 12.5 MG tablet, Take 1 tablet (12.5 mg total) by mouth 2 (two) times daily with a meal., Disp: 60 tablet, Rfl: 12 .  Cholecalciferol (VITAMIN D) 2000 UNITS CAPS, Take 1 capsule by mouth daily., Disp: , Rfl:  .  clopidogrel (PLAVIX) 75 MG tablet, Take 1 tablet (75 mg total) by mouth daily., Disp: 30 tablet, Rfl: 12 .  denosumab (PROLIA) 60 MG/ML SOLN injection, Inject 60 mg into the skin every 6 (six) months. Administer in upper arm, thigh, or abdomen, Disp: 60 mL, Rfl: 1 .  furosemide (LASIX) 40 MG tablet, TAKE 1 TABLET BY MOUTH DAILY, Disp: 30 tablet, Rfl: 11 .  isosorbide mononitrate (IMDUR) 30 MG 24 hr tablet, TAKE 1 TABLET BY MOUTH DAILY, Disp: 30 tablet, Rfl: 3 .  losartan (COZAAR) 50 MG tablet, TAKE 1 TABLET BY MOUTH DAILY, Disp: 30 tablet, Rfl: 11 .  LOTEMAX 0.5 % ophthalmic suspension, Place 1 drop into both eyes daily. , Disp: , Rfl:  .  nitroGLYCERIN  (NITROSTAT) 0.4 MG SL tablet, Place 1 tablet (0.4 mg total) under the tongue every 5 (five) minutes as needed for chest pain., Disp: 25 tablet, Rfl: 3 .  pantoprazole (PROTONIX) 40 MG tablet, Take 1 tablet (40 mg total) by mouth daily., Disp: 30 tablet, Rfl: 11 .  simvastatin (ZOCOR) 40 MG tablet, TAKE ONE TABLET BY MOUTH AT BEDTIME, Disp: 30 tablet, Rfl: 11 .  spironolactone (ALDACTONE) 25 MG tablet, TAKE 1/2  TABLET BY MOUTH ONCE DAILY., Disp: 30 tablet, Rfl: 6  Past Medical History: Past Medical History:  Diagnosis Date  . Arthritis   . CAD (coronary artery disease) 03/2009   s/p MI  . Chronic combined systolic and diastolic heart failure ([place]) 06/17/2009   Qualifier: Diagnosis of  By: Burt Knack, MD, Clayburn Pert   . CKD (chronic kidney disease) stage 3, GFR 30-59 ml/min   . Emphysema/COPD ([place])   . Ex-smoker quit 2008  . GERD (gastroesophageal reflux disease)   . Helicobacter pylori gastritis 10/2007   treated  . History of CVA (cerebrovascular accident) without residual deficits 01/2012, 06/2012   R hemorrhagic MCA 01/2012 with remote lacunar infarct L putamen and IC, rpt 06/2012 acute multifocal R MCA infarct with remote hemorrhagic strokes affecting L basal ganglia and periventricular white matter, full recovery  . HLD (hyperlipidemia)   . HTN (hypertension)   . Ischemic cardiomyopathy 02/27/2014  . Lung cancer ([place]) dx'd 07/2006   Lung CA,  s/p resection, followed by Dr Earlie Server  . Macular degeneration   . Myocardial infarction [facility]) 03/2009   Acute myocardial infarction 2010 - treated with BMS of LCx. LVEF 50% with subsequent CHF  . Osteoporosis 09/2013   T -3.6 forearm 09/2013, T -4.5 forearm 04/2015    Tobacco Use: History  Smoking Status  . Former Smoker  . Packs/day: 2.00  . Years: 40.00  . Types: Cigarettes  . Quit date: 07/27/2006  Smokeless Tobacco  . Never Used    Labs: Recent Review Flowsheet Data    Labs for ITP Cardiac and Pulmonary Rehab Latest Ref Rng & Units  10/26/2014 03/28/2015 02/24/2016 02/24/2016 02/24/2016   Cholestrol 0 - 200 mg/dL 161 140 113 99 -   LDLCALC 0 - 99 mg/dL 92 71 49 47 -   HDL >40 mg/dL 52.20 57.50 48 42 -   Trlycerides <150 mg/dL 86.0 62.0 80 48 -   Hemoglobin A1c 4.8 - 5.6 % - 6.1 - 5.9(H) -   PHART 7.350 - 7.400 - - - - -   PCO2ART 35.0 - 45.0 mmHg - - - - -   HCO3 20.0 - 24.0 mEq/L - - - - -   TCO2 0 - 100 mmol/L - - - - 30   O2SAT % - - - - -       Exercise Target Goals: Date: 03/09/16  Exercise Program Goal: Individual exercise prescription set with THRR, safety & activity barriers. Participant demonstrates ability to understand and report RPE using BORG scale, to self-measure pulse accurately, and to acknowledge the importance of the exercise prescription.  Exercise Prescription Goal: Starting with aerobic activity 30 plus minutes a day, 3 days per week for initial exercise prescription. Provide home exercise prescription and guidelines that participant acknowledges understanding prior to discharge.  Activity Barriers & Risk Stratification:     Activity Barriers & Cardiac Risk Stratification - 03/09/16 1355      Activity Barriers & Cardiac Risk Stratification   Activity Barriers Deconditioning;Shortness of Breath;Other (comment);Balance Concerns   Comments Sciatic nerve pain, occasional dizziness   Cardiac Risk Stratification High      6 Minute Walk:     6 Minute Walk    Row Name 03/09/16 1350         6 Minute Walk   Phase Initial     Distance 965 feet     Walk Time 6 minutes     # of Rest Breaks 0     MPH 1.83     METS 2.59     RPE 17     Perceived Dyspnea  3     VO2 Peak 9.06     Symptoms Yes (comment)     Comments Sciattic nerve pain, some SOB     Resting HR 58 bpm     Resting BP 130/74     Max Ex. HR 101 bpm     Max Ex. BP 134/74     2 Minute Post BP 128/64       Interval Oxygen   Interval Oxygen? Yes     6 Minute Oxygen Saturation % 95 %     6 Minute Liters of Oxygen 0 L  Room  Air        Initial Exercise Prescription:     Initial Exercise Prescription - 03/09/16 1300      Date of Initial Exercise RX and Referring Provider   Date 03/09/16   Referring Provider Sherren Mocha MD  Treadmill   MPH 1.6   Grade 0.5   Minutes 15   METs 2.34     NuStep   Level 1  60-80 spm   Minutes 15   METs 2     Arm/Foot Ergometer   Level 1  25-40 rpm   Minutes 15   METs 2     Prescription Details   Frequency (times per week) 3   Duration Progress to 45 minutes of aerobic exercise without signs/symptoms of physical distress     Intensity   THRR 40-80% of Max Heartrate 93-128   Ratings of Perceived Exertion 11-15   Perceived Dyspnea 0-4     Progression   Progression Continue to progress workloads to maintain intensity without signs/symptoms of physical distress.     Resistance Training   Training Prescription Yes   Weight 2 lbs   Reps 10-15      Perform Capillary Blood Glucose checks as needed.  Exercise Prescription Changes:   Exercise Comments:     Exercise Comments    Row Name 03/09/16 1357           Exercise Comments Anjeli wants to be able to do what she wants and able to go walking again.          Discharge Exercise Prescription (Final Exercise Prescription Changes):   Nutrition:  Target Goals: Understanding of nutrition guidelines, daily intake of sodium '1500mg'$ , cholesterol '200mg'$ , calories 30% from fat and 7% or less from saturated fats, daily to have 5 or more servings of fruits and vegetables.  Biometrics:     Pre Biometrics - 03/09/16 1356      Pre Biometrics   Height 5' 6.5" (1.689 m)   Weight 115 lb 6.4 oz (52.3 kg)   Waist Circumference 31 inches   Hip Circumference 37 inches   Waist to Hip Ratio 0.84 %   BMI (Calculated) 18.4   Single Leg Stand 4.12 seconds       Nutrition Therapy Plan and Nutrition Goals:   Nutrition Discharge: Rate Your Plate Scores:   Nutrition Goals  Re-Evaluation:   Psychosocial: Target Goals: Acknowledge presence or absence of depression, maximize coping skills, provide positive support system. Participant is able to verbalize types and ability to use techniques and skills needed for reducing stress and depression.  Initial Review & Psychosocial Screening:     Initial Psych Review & Screening - 03/09/16 [place]? Yes   Comments Catelin has one son, 3 grandchildren, one brother and niece that are supportive.  Torina lists her brother as her emergency contact.  Niajah lives alone and has no church affiliation.  She was recently sitting with a patient, who has dementia.  This was just prior to her heart attack.  She described working with the demenia patient as stressful.  Njeri is not currently working.       Barriers   Psychosocial barriers to participate in program There are no identifiable barriers or psychosocial needs.     Screening Interventions   Interventions Encouraged to exercise;Program counselor consult      Quality of Life Scores:     Quality of Life - 03/09/16 1516      Quality of Life Scores   Health/Function Pre 16.97 %   Socioeconomic Pre 20.71 %   Psych/Spiritual Pre 19.93 %   Family Pre 19 %   GLOBAL Pre 18.65 %      PHQ-9: Recent Review  Flowsheet Data    Depression screen [facility] 2/9 03/09/2016 03/09/2016 01/08/2016 04/08/2015   Decreased Interest 0 0 0 0   Down, Depressed, Hopeless 0 0 0 0   PHQ - 2 Score 0 0 0 0   Altered sleeping 0 - - -   Tired, decreased energy 0 - - -   Change in appetite 0 - - -   Feeling bad or failure about yourself  0 - - -   Trouble concentrating 0 - - -   Moving slowly or fidgety/restless 0 - - -   Suicidal thoughts 0 - - -   PHQ-9 Score 0 - - -   Difficult doing work/chores Not difficult at all - - -      Psychosocial Evaluation and Intervention:   Psychosocial Re-Evaluation:   Vocational Rehabilitation: Provide vocational  rehab assistance to qualifying candidates.   Vocational Rehab Evaluation & Intervention:     Vocational Rehab - 03/09/16 1522      Initial Vocational Rehab Evaluation & Intervention   Assessment shows need for Vocational Rehabilitation No      Education: Education Goals: Education classes will be provided on a weekly basis, covering required topics. Participant will state understanding/return demonstration of topics presented.  Learning Barriers/Preferences:     Learning Barriers/Preferences - 03/09/16 1523      Learning Barriers/Preferences   Learning Barriers Exercise Concerns   Learning Preferences Written Material      Education Topics: General Nutrition Guidelines/Fats and Fiber: -Group instruction provided by verbal, written material, models and posters to present the general guidelines for heart healthy nutrition. Gives an explanation and review of dietary fats and fiber.   Controlling Sodium/Reading Food Labels: -Group verbal and written material supporting the discussion of sodium use in heart healthy nutrition. Review and explanation with models, verbal and written materials for utilization of the food label.   Exercise Physiology & Risk Factors: - Group verbal and written instruction with models to review the exercise physiology of the cardiovascular system and associated critical values. Details cardiovascular disease risk factors and the goals associated with each risk factor.   Aerobic Exercise & Resistance Training: - Gives group verbal and written discussion on the health impact of inactivity. On the components of aerobic and resistive training programs and the benefits of this training and how to safely progress through these programs.   Flexibility, Balance, General Exercise Guidelines: - Provides group verbal and written instruction on the benefits of flexibility and balance training programs. Provides general exercise guidelines with specific guidelines  to those with heart or lung disease. Demonstration and skill practice provided.   Stress Management: - Provides group verbal and written instruction about the health risks of elevated stress, cause of high stress, and healthy ways to reduce stress.   Depression: - Provides group verbal and written instruction on the correlation between heart/lung disease and depressed mood, treatment options, and the stigmas associated with seeking treatment.   Anatomy & Physiology of the Heart: - Group verbal and written instruction and models provide basic cardiac anatomy and physiology, with the coronary electrical and arterial systems. Review of: AMI, Angina, Valve disease, Heart Failure, Cardiac Arrhythmia, Pacemakers, and the ICD.   Cardiac Procedures: - Group verbal and written instruction and models to describe the testing methods done to diagnose heart disease. Reviews the outcomes of the test results. Describes the treatment choices: Medical Management, Angioplasty, or Coronary Bypass Surgery.   Cardiac Medications: - Group verbal and written instruction to review  commonly prescribed medications for heart disease. Reviews the medication, class of the drug, and side effects. Includes the steps to properly store meds and maintain the prescription regimen.   Go Sex-Intimacy & Heart Disease, Get SMART - Goal Setting: - Group verbal and written instruction through game format to discuss heart disease and the return to sexual intimacy. Provides group verbal and written material to discuss and apply goal setting through the application of the S.M.A.R.T. Method.   Other Matters of the Heart: - Provides group verbal, written materials and models to describe Heart Failure, Angina, Valve Disease, and Diabetes in the realm of heart disease. Includes description of the disease process and treatment options available to the cardiac patient.   Exercise & Equipment Safety: - Individual verbal instruction  and demonstration of equipment use and safety with use of the equipment. Flowsheet Row Cardiac Rehab from 03/09/2016 in [facility] Cardiac and Pulmonary Rehab  Date  03/09/16  Educator  D. Joya Gaskins, RN  Instruction Review Code  1- partially meets, needs review/practice      Infection Prevention: - Provides verbal and written material to individual with discussion of infection control including proper hand washing and proper equipment cleaning during exercise session. Flowsheet Row Cardiac Rehab from 03/09/2016 in [facility] Cardiac and Pulmonary Rehab  Date  03/09/16  Educator  D. Joya Gaskins, RN  Instruction Review Code  2- meets goals/outcomes      Falls Prevention: - Provides verbal and written material to individual with discussion of falls prevention and safety. Flowsheet Row Cardiac Rehab from 03/09/2016 in [facility] Cardiac and Pulmonary Rehab  Date  03/09/16  Educator  D. Joya Gaskins, RN  Instruction Review Code  2- meets goals/outcomes      Diabetes: - Individual verbal and written instruction to review signs/symptoms of diabetes, desired ranges of glucose level fasting, after meals and with exercise. Advice that pre and post exercise glucose checks will be done for 3 sessions at entry of program.    Knowledge Questionnaire Score:     Knowledge Questionnaire Score - 03/09/16 1521      Knowledge Questionnaire Score   Pre Score 16/28      Core Components/Risk Factors/Patient Goals at Admission:     Personal Goals and Risk Factors at Admission - 03/09/16 1527      Core Components/Risk Factors/Patient Goals on Admission    Weight Management Weight Gain  Tichina does not want to lose any weight.  She would like to gain a few pounds.     Sedentary Yes   Intervention Provide advice, education, support and counseling about physical activity/exercise needs.;Develop an individualized exercise prescription for aerobic and resistive training based on initial evaluation findings, risk stratification,  comorbidities and participant's personal goals.   Expected Outcomes Achievement of increased cardiorespiratory fitness and enhanced flexibility, muscular endurance and strength shown through measurements of functional capacity and personal statement of participant.   Increase Strength and Stamina Yes   Intervention Provide advice, education, support and counseling about physical activity/exercise needs.;Develop an individualized exercise prescription for aerobic and resistive training based on initial evaluation findings, risk stratification, comorbidities and participant's personal goals.   Expected Outcomes Achievement of increased cardiorespiratory fitness and enhanced flexibility, muscular endurance and strength shown through measurements of functional capacity and personal statement of participant.   Improve shortness of breath with ADL's Yes   Intervention Provide education, individualized exercise plan and daily activity instruction to help decrease symptoms of SOB with activities of daily living.   Expected Outcomes Short Term:  Achieves a reduction of symptoms when performing activities of daily living.   Hypertension Yes   Intervention Provide education on lifestyle modifcations including regular physical activity/exercise, weight management, moderate sodium restriction and increased consumption of fresh fruit, vegetables, and low fat dairy, alcohol moderation, and smoking cessation.;Monitor prescription use compliance.   Expected Outcomes Short Term: Continued assessment and intervention until BP is < 140/42m HG in hypertensive participants. < 130/8106mHG in hypertensive participants with diabetes, heart failure or chronic kidney disease.;Long Term: Maintenance of blood pressure at goal levels.   Lipids Yes   Intervention Provide education and support for participant on nutrition & aerobic/resistive exercise along with prescribed medications to achieve LDL '70mg'$ , HDL >'40mg'$ .   Expected  Outcomes Short Term: Participant states understanding of desired cholesterol values and is compliant with medications prescribed. Participant is following exercise prescription and nutrition guidelines.;Long Term: Cholesterol controlled with medications as prescribed, with individualized exercise RX and with personalized nutrition plan. Value goals: LDL < '70mg'$ , HDL > 40 mg.      Core Components/Risk Factors/Patient Goals Review:    Core Components/Risk Factors/Patient Goals at Discharge (Final Review):    ITP Comments:     ITP Comments    Row Name 03/09/16 1533           ITP Comments WaPamelaas a history of lung cancer.  She is SOB at times and stated right before her last heart attack she was very SOB.  WaAntonelaould like to increase her strength and stamina; decrease her SOB; maintain her current weight or gain a few pounds; control her blood pressure and lipids through exercise and the cardiac rehab program.  She denies any chest pain since her STEMI on 02/24/2016.            Comments:  WaLilyonnaill start Cardiac Rehab on Monday, March 16, 2016.

## 2016-03-10 ENCOUNTER — Encounter: Payer: Self-pay | Admitting: Cardiology

## 2016-03-11 ENCOUNTER — Emergency Department (HOSPITAL_COMMUNITY): Payer: PPO

## 2016-03-11 ENCOUNTER — Encounter (HOSPITAL_COMMUNITY): Payer: Self-pay

## 2016-03-11 ENCOUNTER — Telehealth: Payer: Self-pay | Admitting: Cardiovascular Disease

## 2016-03-11 ENCOUNTER — Telehealth: Payer: Self-pay

## 2016-03-11 ENCOUNTER — Emergency Department (HOSPITAL_COMMUNITY)
Admission: EM | Admit: 2016-03-11 | Discharge: 2016-03-11 | Disposition: A | Discharge status: Home / Self Care | Payer: PPO | Attending: Emergency Medicine | Admitting: Emergency Medicine

## 2016-03-11 ENCOUNTER — Encounter: Payer: Self-pay | Admitting: *Deleted

## 2016-03-11 ENCOUNTER — Other Ambulatory Visit: Payer: Self-pay

## 2016-03-11 DIAGNOSIS — J449 Chronic obstructive pulmonary disease, unspecified: Secondary | ICD-10-CM | POA: Diagnosis not present

## 2016-03-11 DIAGNOSIS — Z87891 Personal history of nicotine dependence: Secondary | ICD-10-CM | POA: Diagnosis not present

## 2016-03-11 DIAGNOSIS — R0902 Hypoxemia: Secondary | ICD-10-CM | POA: Diagnosis not present

## 2016-03-11 DIAGNOSIS — Z85118 Personal history of other malignant neoplasm of bronchus and lung: Secondary | ICD-10-CM | POA: Insufficient documentation

## 2016-03-11 DIAGNOSIS — R0602 Shortness of breath: Secondary | ICD-10-CM | POA: Insufficient documentation

## 2016-03-11 DIAGNOSIS — I5042 Chronic combined systolic (congestive) and diastolic (congestive) heart failure: Secondary | ICD-10-CM | POA: Insufficient documentation

## 2016-03-11 DIAGNOSIS — I252 Old myocardial infarction: Secondary | ICD-10-CM | POA: Insufficient documentation

## 2016-03-11 DIAGNOSIS — Z79899 Other long term (current) drug therapy: Secondary | ICD-10-CM | POA: Insufficient documentation

## 2016-03-11 DIAGNOSIS — I130 Hypertensive heart and chronic kidney disease with heart failure and stage 1 through stage 4 chronic kidney disease, or unspecified chronic kidney disease: Secondary | ICD-10-CM | POA: Diagnosis not present

## 2016-03-11 DIAGNOSIS — Z7982 Long term (current) use of aspirin: Secondary | ICD-10-CM | POA: Diagnosis not present

## 2016-03-11 DIAGNOSIS — Z8673 Personal history of transient ischemic attack (TIA), and cerebral infarction without residual deficits: Secondary | ICD-10-CM | POA: Insufficient documentation

## 2016-03-11 DIAGNOSIS — I2510 Atherosclerotic heart disease of native coronary artery without angina pectoris: Secondary | ICD-10-CM | POA: Diagnosis not present

## 2016-03-11 DIAGNOSIS — N183 Chronic kidney disease, stage 3 (moderate): Secondary | ICD-10-CM | POA: Insufficient documentation

## 2016-03-11 DIAGNOSIS — I2102 ST elevation (STEMI) myocardial infarction involving left anterior descending coronary artery: Secondary | ICD-10-CM

## 2016-03-11 LAB — D-DIMER, QUANTITATIVE (NOT AT ARMC): D DIMER QUANT: 1.53 ug{FEU}/mL — AB (ref 0.00–0.50)

## 2016-03-11 LAB — CBC WITH DIFFERENTIAL/PLATELET
BASOS ABS: 0 10*3/uL (ref 0.0–0.1)
BASOS PCT: 0 %
EOS PCT: 8 %
Eosinophils Absolute: 0.8 10*3/uL — ABNORMAL HIGH (ref 0.0–0.7)
HCT: 40.2 % (ref 36.0–46.0)
Hemoglobin: 13 g/dL (ref 12.0–15.0)
Lymphocytes Relative: 18 %
Lymphs Abs: 1.9 10*3/uL (ref 0.7–4.0)
MCH: 31.2 pg (ref 26.0–34.0)
MCHC: 32.3 g/dL (ref 30.0–36.0)
MCV: 96.4 fL (ref 78.0–100.0)
MONO ABS: 0.5 10*3/uL (ref 0.1–1.0)
Monocytes Relative: 4 %
NEUTROS ABS: 7.3 10*3/uL (ref 1.7–7.7)
Neutrophils Relative %: 70 %
PLATELETS: 250 10*3/uL (ref 150–400)
RBC: 4.17 MIL/uL (ref 3.87–5.11)
RDW: 13.6 % (ref 11.5–15.5)
WBC: 10.4 10*3/uL (ref 4.0–10.5)

## 2016-03-11 LAB — BASIC METABOLIC PANEL
ANION GAP: 5 (ref 5–15)
BUN: 18 mg/dL (ref 6–20)
CALCIUM: 10.1 mg/dL (ref 8.9–10.3)
CO2: 31 mmol/L (ref 22–32)
Chloride: 100 mmol/L — ABNORMAL LOW (ref 101–111)
Creatinine, Ser: 1.1 mg/dL — ABNORMAL HIGH (ref 0.44–1.00)
GFR, EST AFRICAN AMERICAN: 55 mL/min — AB (ref >60)
GFR, EST NON AFRICAN AMERICAN: 48 mL/min — AB (ref >60)
Glucose, Bld: 110 mg/dL — ABNORMAL HIGH (ref 65–99)
Potassium: 4.2 mmol/L (ref 3.5–5.1)
Sodium: 136 mmol/L (ref 135–145)

## 2016-03-11 LAB — I-STAT TROPONIN, ED: Troponin i, poc: 0 ng/mL (ref 0.00–0.08)

## 2016-03-11 MED ORDER — ALBUTEROL SULFATE (2.5 MG/3ML) 0.083% IN NEBU
5.0000 mg | INHALATION_SOLUTION | Freq: Once | RESPIRATORY_TRACT | Status: AC
  Administered 2016-03-11: 5 mg via RESPIRATORY_TRACT
  Filled 2016-03-11: qty 6

## 2016-03-11 MED ORDER — IOPAMIDOL (ISOVUE-370) INJECTION 76%
INTRAVENOUS | Status: AC
  Administered 2016-03-11: 100 mL
  Filled 2016-03-11: qty 100

## 2016-03-11 NOTE — ED Notes (Signed)
Pt ambulated to restroom from room, tolerated well. 

## 2016-03-11 NOTE — Telephone Encounter (Signed)
Pt currently at ER being evaluated. plz schedule f/u appt with me to discuss breathing difficulties when discharged from ER.

## 2016-03-11 NOTE — ED Notes (Addendum)
Pt ambulated to restroom. While ambulating pt, placed pt on monitor pt's O2 remained while walking around 97%-95%. Nurse was notified.

## 2016-03-11 NOTE — ED Notes (Signed)
Reviewed D/C instructions with patient and her niece and both verbalize understanding of follow up care an treatment

## 2016-03-11 NOTE — Discharge Instructions (Signed)
Continue taking your home medications as prescribed. Continue using your home albuterol inhaler as needed for shortness of breath/wheezing.  Follow up with your primary care provider within the next week. I recommend discussing having a home nebulizer treatment at home.  Follow up with your cancer doctor at your next scheduled appointment. Please return to the Emergency Department if symptoms worsen or new onset of fever, productive cough, coughing up blood, chest pain, difficulty breathing, palpitations, lightheadedness, dizziness, syncope, vomiting.

## 2016-03-11 NOTE — Telephone Encounter (Signed)
Error

## 2016-03-11 NOTE — ED Notes (Signed)
Going to xray 

## 2016-03-11 NOTE — ED Notes (Signed)
CT notified of the new IV for angio

## 2016-03-11 NOTE — Telephone Encounter (Signed)
Linard Millers left v/m(do not see signed DRP ); pt was having trouble breathing and pt went to [facility] ED;  [facility] advised pt to contact PCP to get a nebulizer. Almyra Free request cb. Pt last saw Dr Darnell Level on 10/07/15.no future appt scheduled.

## 2016-03-11 NOTE — ED Notes (Signed)
Pt placed in gown and bedside monitor on

## 2016-03-11 NOTE — Progress Notes (Signed)
Cardiac Individual Treatment Plan  Patient Details  Name: [name] MRN: [id number] Date of Birth: 03/04/1941 Referring Provider:   Flowsheet Row Cardiac Rehab from 03/09/2016 in [facility] Cardiac and Pulmonary Rehab  Referring Provider  Sherren Mocha MD      Initial Encounter Date:  Flowsheet Row Cardiac Rehab from 03/09/2016 in [facility] Cardiac and Pulmonary Rehab  Date  03/09/16  Referring Provider  Sherren Mocha MD      Visit Diagnosis: ST elevation myocardial infarction involving left anterior descending (LAD) coronary artery ([place])  Patient's Home Medications on Admission:  Current Outpatient Prescriptions:  .  albuterol (PROAIR HFA) 108 (90 BASE) MCG/ACT inhaler, Inhale 2 puffs into the lungs every 4 (four) hours as needed for wheezing or shortness of breath., Disp: 1 Inhaler, Rfl: 1 .  aspirin EC 81 MG tablet, Take 1 tablet (81 mg total) by mouth daily., Disp: , Rfl:  .  Calcium Carbonate-Vitamin D (CALCIUM-VITAMIN D) 500-200 MG-UNIT per tablet, Take 2 tablets by mouth daily., Disp: , Rfl:  .  carvedilol (COREG) 12.5 MG tablet, Take 1 tablet (12.5 mg total) by mouth 2 (two) times daily with a meal., Disp: 60 tablet, Rfl: 12 .  Cholecalciferol (VITAMIN D) 2000 UNITS CAPS, Take 1 capsule by mouth daily., Disp: , Rfl:  .  clopidogrel (PLAVIX) 75 MG tablet, Take 1 tablet (75 mg total) by mouth daily., Disp: 30 tablet, Rfl: 12 .  denosumab (PROLIA) 60 MG/ML SOLN injection, Inject 60 mg into the skin every 6 (six) months. Administer in upper arm, thigh, or abdomen, Disp: 60 mL, Rfl: 1 .  furosemide (LASIX) 40 MG tablet, TAKE 1 TABLET BY MOUTH DAILY, Disp: 30 tablet, Rfl: 11 .  isosorbide mononitrate (IMDUR) 30 MG 24 hr tablet, TAKE 1 TABLET BY MOUTH DAILY, Disp: 30 tablet, Rfl: 3 .  losartan (COZAAR) 50 MG tablet, TAKE 1 TABLET BY MOUTH DAILY, Disp: 30 tablet, Rfl: 11 .  LOTEMAX 0.5 % ophthalmic suspension, Place 1 drop into both eyes daily. , Disp: , Rfl:  .  nitroGLYCERIN  (NITROSTAT) 0.4 MG SL tablet, Place 1 tablet (0.4 mg total) under the tongue every 5 (five) minutes as needed for chest pain., Disp: 25 tablet, Rfl: 3 .  pantoprazole (PROTONIX) 40 MG tablet, Take 1 tablet (40 mg total) by mouth daily., Disp: 30 tablet, Rfl: 11 .  simvastatin (ZOCOR) 40 MG tablet, TAKE ONE TABLET BY MOUTH AT BEDTIME, Disp: 30 tablet, Rfl: 11 .  spironolactone (ALDACTONE) 25 MG tablet, TAKE 1/2  TABLET BY MOUTH ONCE DAILY., Disp: 30 tablet, Rfl: 6  Past Medical History: Past Medical History:  Diagnosis Date  . Arthritis   . CAD (coronary artery disease) 03/2009   s/p MI  . Chronic combined systolic and diastolic heart failure ([place]) 06/17/2009   Qualifier: Diagnosis of  By: Burt Knack, MD, Clayburn Pert   . CKD (chronic kidney disease) stage 3, GFR 30-59 ml/min   . Emphysema/COPD ([place])   . Ex-smoker quit 2008  . GERD (gastroesophageal reflux disease)   . Helicobacter pylori gastritis 10/2007   treated  . History of CVA (cerebrovascular accident) without residual deficits 01/2012, 06/2012   R hemorrhagic MCA 01/2012 with remote lacunar infarct L putamen and IC, rpt 06/2012 acute multifocal R MCA infarct with remote hemorrhagic strokes affecting L basal ganglia and periventricular white matter, full recovery  . HLD (hyperlipidemia)   . HTN (hypertension)   . Ischemic cardiomyopathy 02/27/2014  . Lung cancer ([place]) dx'd 07/2006   Lung CA,  s/p resection, followed by Dr Earlie Server  . Macular degeneration   . Myocardial infarction [facility]) 03/2009   Acute myocardial infarction 2010 - treated with BMS of LCx. LVEF 50% with subsequent CHF  . Osteoporosis 09/2013   T -3.6 forearm 09/2013, T -4.5 forearm 04/2015    Tobacco Use: History  Smoking Status  . Former Smoker  . Packs/day: 2.00  . Years: 40.00  . Types: Cigarettes  . Quit date: 07/27/2006  Smokeless Tobacco  . Never Used    Labs: Recent Review Flowsheet Data    Labs for ITP Cardiac and Pulmonary Rehab Latest Ref Rng & Units  10/26/2014 03/28/2015 02/24/2016 02/24/2016 02/24/2016   Cholestrol 0 - 200 mg/dL 161 140 113 99 -   LDLCALC 0 - 99 mg/dL 92 71 49 47 -   HDL >40 mg/dL 52.20 57.50 48 42 -   Trlycerides <150 mg/dL 86.0 62.0 80 48 -   Hemoglobin A1c 4.8 - 5.6 % - 6.1 - 5.9(H) -   PHART 7.350 - 7.400 - - - - -   PCO2ART 35.0 - 45.0 mmHg - - - - -   HCO3 20.0 - 24.0 mEq/L - - - - -   TCO2 0 - 100 mmol/L - - - - 30   O2SAT % - - - - -       Exercise Target Goals:    Exercise Program Goal: Individual exercise prescription set with THRR, safety & activity barriers. Participant demonstrates ability to understand and report RPE using BORG scale, to self-measure pulse accurately, and to acknowledge the importance of the exercise prescription.  Exercise Prescription Goal: Starting with aerobic activity 30 plus minutes a day, 3 days per week for initial exercise prescription. Provide home exercise prescription and guidelines that participant acknowledges understanding prior to discharge.  Activity Barriers & Risk Stratification:     Activity Barriers & Cardiac Risk Stratification - 03/09/16 1355      Activity Barriers & Cardiac Risk Stratification   Activity Barriers Deconditioning;Shortness of Breath;Other (comment);Balance Concerns   Comments Sciatic nerve pain, occasional dizziness   Cardiac Risk Stratification High      6 Minute Walk:     6 Minute Walk    Row Name 03/09/16 1350         6 Minute Walk   Phase Initial     Distance 965 feet     Walk Time 6 minutes     # of Rest Breaks 0     MPH 1.83     METS 2.59     RPE 17     Perceived Dyspnea  3     VO2 Peak 9.06     Symptoms Yes (comment)     Comments Sciattic nerve pain, some SOB     Resting HR 58 bpm     Resting BP 130/74     Max Ex. HR 101 bpm     Max Ex. BP 134/74     2 Minute Post BP 128/64       Interval Oxygen   Interval Oxygen? Yes     6 Minute Oxygen Saturation % 95 %     6 Minute Liters of Oxygen 0 L  Room Air         Initial Exercise Prescription:     Initial Exercise Prescription - 03/09/16 1300      Date of Initial Exercise RX and Referring Provider   Date 03/09/16   Referring Provider Sherren Mocha MD  Treadmill   MPH 1.6   Grade 0.5   Minutes 15   METs 2.34     NuStep   Level 1  60-80 spm   Minutes 15   METs 2     Arm/Foot Ergometer   Level 1  25-40 rpm   Minutes 15   METs 2     Prescription Details   Frequency (times per week) 3   Duration Progress to 45 minutes of aerobic exercise without signs/symptoms of physical distress     Intensity   THRR 40-80% of Max Heartrate 93-128   Ratings of Perceived Exertion 11-15   Perceived Dyspnea 0-4     Progression   Progression Continue to progress workloads to maintain intensity without signs/symptoms of physical distress.     Resistance Training   Training Prescription Yes   Weight 2 lbs   Reps 10-15      Perform Capillary Blood Glucose checks as needed.  Exercise Prescription Changes:   Exercise Comments:     Exercise Comments    Row Name 03/09/16 1357           Exercise Comments Harris wants to be able to do what she wants and able to go walking again.          Discharge Exercise Prescription (Final Exercise Prescription Changes):   Nutrition:  Target Goals: Understanding of nutrition guidelines, daily intake of sodium '1500mg'$ , cholesterol '200mg'$ , calories 30% from fat and 7% or less from saturated fats, daily to have 5 or more servings of fruits and vegetables.  Biometrics:     Pre Biometrics - 03/09/16 1356      Pre Biometrics   Height 5' 6.5" (1.689 m)   Weight 115 lb 6.4 oz (52.3 kg)   Waist Circumference 31 inches   Hip Circumference 37 inches   Waist to Hip Ratio 0.84 %   BMI (Calculated) 18.4   Single Leg Stand 4.12 seconds       Nutrition Therapy Plan and Nutrition Goals:   Nutrition Discharge: Rate Your Plate Scores:     Nutrition Assessments - 03/09/16 1549      Rate  Your Plate Scores   Pre Score 69   Pre Score % 76.6 %      Nutrition Goals Re-Evaluation:   Psychosocial: Target Goals: Acknowledge presence or absence of depression, maximize coping skills, provide positive support system. Participant is able to verbalize types and ability to use techniques and skills needed for reducing stress and depression.  Initial Review & Psychosocial Screening:     Initial Psych Review & Screening - 03/09/16 [place]? Yes   Comments Merlinda has one son, 3 grandchildren, one brother and niece that are supportive.  Shaquandra lists her brother as her emergency contact.  Adama lives alone and has no church affiliation.  She was recently sitting with a patient, who has dementia.  This was just prior to her heart attack.  She described working with the demenia patient as stressful.  Esthefany is not currently working.       Barriers   Psychosocial barriers to participate in program There are no identifiable barriers or psychosocial needs.     Screening Interventions   Interventions Encouraged to exercise;Program counselor consult      Quality of Life Scores:     Quality of Life - 03/09/16 1516      Quality of Life Scores   Health/Function Pre 16.97 %  Socioeconomic Pre 20.71 %   Psych/Spiritual Pre 19.93 %   Family Pre 19 %   GLOBAL Pre 18.65 %      PHQ-9: Recent Review Flowsheet Data    Depression screen [facility] 2/9 03/09/2016 03/09/2016 01/08/2016 04/08/2015   Decreased Interest 0 0 0 0   Down, Depressed, Hopeless 0 0 0 0   PHQ - 2 Score 0 0 0 0   Altered sleeping 0 - - -   Tired, decreased energy 0 - - -   Change in appetite 0 - - -   Feeling bad or failure about yourself  0 - - -   Trouble concentrating 0 - - -   Moving slowly or fidgety/restless 0 - - -   Suicidal thoughts 0 - - -   PHQ-9 Score 0 - - -   Difficult doing work/chores Not difficult at all - - -      Psychosocial Evaluation and  Intervention:   Psychosocial Re-Evaluation:   Vocational Rehabilitation: Provide vocational rehab assistance to qualifying candidates.   Vocational Rehab Evaluation & Intervention:     Vocational Rehab - 03/09/16 1522      Initial Vocational Rehab Evaluation & Intervention   Assessment shows need for Vocational Rehabilitation No      Education: Education Goals: Education classes will be provided on a weekly basis, covering required topics. Participant will state understanding/return demonstration of topics presented.  Learning Barriers/Preferences:     Learning Barriers/Preferences - 03/09/16 1523      Learning Barriers/Preferences   Learning Barriers Exercise Concerns   Learning Preferences Written Material      Education Topics: General Nutrition Guidelines/Fats and Fiber: -Group instruction provided by verbal, written material, models and posters to present the general guidelines for heart healthy nutrition. Gives an explanation and review of dietary fats and fiber.   Controlling Sodium/Reading Food Labels: -Group verbal and written material supporting the discussion of sodium use in heart healthy nutrition. Review and explanation with models, verbal and written materials for utilization of the food label.   Exercise Physiology & Risk Factors: - Group verbal and written instruction with models to review the exercise physiology of the cardiovascular system and associated critical values. Details cardiovascular disease risk factors and the goals associated with each risk factor.   Aerobic Exercise & Resistance Training: - Gives group verbal and written discussion on the health impact of inactivity. On the components of aerobic and resistive training programs and the benefits of this training and how to safely progress through these programs.   Flexibility, Balance, General Exercise Guidelines: - Provides group verbal and written instruction on the benefits of  flexibility and balance training programs. Provides general exercise guidelines with specific guidelines to those with heart or lung disease. Demonstration and skill practice provided.   Stress Management: - Provides group verbal and written instruction about the health risks of elevated stress, cause of high stress, and healthy ways to reduce stress.   Depression: - Provides group verbal and written instruction on the correlation between heart/lung disease and depressed mood, treatment options, and the stigmas associated with seeking treatment.   Anatomy & Physiology of the Heart: - Group verbal and written instruction and models provide basic cardiac anatomy and physiology, with the coronary electrical and arterial systems. Review of: AMI, Angina, Valve disease, Heart Failure, Cardiac Arrhythmia, Pacemakers, and the ICD.   Cardiac Procedures: - Group verbal and written instruction and models to describe the testing methods done to diagnose heart disease.  Reviews the outcomes of the test results. Describes the treatment choices: Medical Management, Angioplasty, or Coronary Bypass Surgery.   Cardiac Medications: - Group verbal and written instruction to review commonly prescribed medications for heart disease. Reviews the medication, class of the drug, and side effects. Includes the steps to properly store meds and maintain the prescription regimen.   Go Sex-Intimacy & Heart Disease, Get SMART - Goal Setting: - Group verbal and written instruction through game format to discuss heart disease and the return to sexual intimacy. Provides group verbal and written material to discuss and apply goal setting through the application of the S.M.A.R.T. Method.   Other Matters of the Heart: - Provides group verbal, written materials and models to describe Heart Failure, Angina, Valve Disease, and Diabetes in the realm of heart disease. Includes description of the disease process and treatment  options available to the cardiac patient.   Exercise & Equipment Safety: - Individual verbal instruction and demonstration of equipment use and safety with use of the equipment. Flowsheet Row Cardiac Rehab from 03/09/2016 in [facility] Cardiac and Pulmonary Rehab  Date  03/09/16  Educator  D. Joya Gaskins, RN  Instruction Review Code  1- partially meets, needs review/practice      Infection Prevention: - Provides verbal and written material to individual with discussion of infection control including proper hand washing and proper equipment cleaning during exercise session. Flowsheet Row Cardiac Rehab from 03/09/2016 in [facility] Cardiac and Pulmonary Rehab  Date  03/09/16  Educator  D. Joya Gaskins, RN  Instruction Review Code  2- meets goals/outcomes      Falls Prevention: - Provides verbal and written material to individual with discussion of falls prevention and safety. Flowsheet Row Cardiac Rehab from 03/09/2016 in [facility] Cardiac and Pulmonary Rehab  Date  03/09/16  Educator  D. Joya Gaskins, RN  Instruction Review Code  2- meets goals/outcomes      Diabetes: - Individual verbal and written instruction to review signs/symptoms of diabetes, desired ranges of glucose level fasting, after meals and with exercise. Advice that pre and post exercise glucose checks will be done for 3 sessions at entry of program.    Knowledge Questionnaire Score:     Knowledge Questionnaire Score - 03/09/16 1521      Knowledge Questionnaire Score   Pre Score 16/28      Core Components/Risk Factors/Patient Goals at Admission:     Personal Goals and Risk Factors at Admission - 03/09/16 1527      Core Components/Risk Factors/Patient Goals on Admission    Weight Management Weight Gain  Nekeshia does not want to lose any weight.  She would like to gain a few pounds.     Sedentary Yes   Intervention Provide advice, education, support and counseling about physical activity/exercise needs.;Develop an individualized exercise  prescription for aerobic and resistive training based on initial evaluation findings, risk stratification, comorbidities and participant's personal goals.   Expected Outcomes Achievement of increased cardiorespiratory fitness and enhanced flexibility, muscular endurance and strength shown through measurements of functional capacity and personal statement of participant.   Increase Strength and Stamina Yes   Intervention Provide advice, education, support and counseling about physical activity/exercise needs.;Develop an individualized exercise prescription for aerobic and resistive training based on initial evaluation findings, risk stratification, comorbidities and participant's personal goals.   Expected Outcomes Achievement of increased cardiorespiratory fitness and enhanced flexibility, muscular endurance and strength shown through measurements of functional capacity and personal statement of participant.   Improve shortness of breath with ADL's  Yes   Intervention Provide education, individualized exercise plan and daily activity instruction to help decrease symptoms of SOB with activities of daily living.   Expected Outcomes Short Term: Achieves a reduction of symptoms when performing activities of daily living.   Hypertension Yes   Intervention Provide education on lifestyle modifcations including regular physical activity/exercise, weight management, moderate sodium restriction and increased consumption of fresh fruit, vegetables, and low fat dairy, alcohol moderation, and smoking cessation.;Monitor prescription use compliance.   Expected Outcomes Short Term: Continued assessment and intervention until BP is < 140/37m HG in hypertensive participants. < 130/858mHG in hypertensive participants with diabetes, heart failure or chronic kidney disease.;Long Term: Maintenance of blood pressure at goal levels.   Lipids Yes   Intervention Provide education and support for participant on nutrition &  aerobic/resistive exercise along with prescribed medications to achieve LDL '70mg'$ , HDL >'40mg'$ .   Expected Outcomes Short Term: Participant states understanding of desired cholesterol values and is compliant with medications prescribed. Participant is following exercise prescription and nutrition guidelines.;Long Term: Cholesterol controlled with medications as prescribed, with individualized exercise RX and with personalized nutrition plan. Value goals: LDL < '70mg'$ , HDL > 40 mg.      Core Components/Risk Factors/Patient Goals Review:    Core Components/Risk Factors/Patient Goals at Discharge (Final Review):    ITP Comments:     ITP Comments    Row Name 03/09/16 1533 03/11/16 0828         ITP Comments Wa[place] a history of lung cancer.  She is SOB at times and stated right before her last heart attack she was very SOB.  WaDaveneould like to increase her strength and stamina; decrease her SOB; maintain her current weight or gain a few pounds; control her blood pressure and lipids through exercise and the cardiac rehab program.  She denies any chest pain since her STEMI on 02/24/2016.   30 day review. Continue with ITP unless changes noted by Medical Director at signature of review.         Comments:

## 2016-03-11 NOTE — ED Provider Notes (Signed)
[facility] DEPT Provider Note   CSN: [id number] Arrival date & time: 03/11/16  0902     History   Chief Complaint Chief Complaint  Patient presents with  . Shortness of Breath    HPI [name] Eye is a 75 y.o. female.  Patient is a 75 year old female with past medical history of CHF, COPD, hypertension, CAD (STEMI on 7/31), lung cancer, CVA and CAD who presents the ED via EMS complaining of shortness of breath. Patient reports when she woke up this morning she began having a productive cough with clear sputum. She notes after having a coughing attack she began to feel short of breath. She notes she used 2 albuterol neb treatments at home with mild relief of symptoms. Denies fever, chills, headache, lightheadedness, dizziness, nasal congestion, sore throat, hemoptysis, wheezing, palpitations, nausea, vomiting, diarrhea, leg swelling, weakness. Patient reports she has been taking her medications as prescribed. Patient notes she was recently admitted to the hospital on 7/31 for a MI, went to the cath lab and was d/c home on 8/2. She notes she has a follow-up appointment scheduled with her cardiologist tomorrow. Denies any known sick contacts.      Past Medical History:  Diagnosis Date  . Arthritis   . CAD (coronary artery disease) 03/2009   s/p MI  . Chronic combined systolic and diastolic heart failure ([place]) 06/17/2009   Qualifier: Diagnosis of  By: Burt Knack, MD, [place] Pert   . CKD (chronic kidney disease) stage 3, GFR 30-59 ml/min   . Emphysema/COPD ([place])   . Ex-smoker quit 2008  . GERD (gastroesophageal reflux disease)   . Helicobacter pylori gastritis 10/2007   treated  . History of CVA (cerebrovascular accident) without residual deficits 01/2012, 06/2012   R hemorrhagic MCA 01/2012 with remote lacunar infarct L putamen and IC, rpt 06/2012 acute multifocal R MCA infarct with remote hemorrhagic strokes affecting L basal ganglia and periventricular white matter, full recovery    . HLD (hyperlipidemia)   . HTN (hypertension)   . Ischemic cardiomyopathy 02/27/2014  . Lung cancer ([place]) dx'd 07/2006   Lung CA, s/p resection, followed by Dr Earlie Server  . Macular degeneration   . Myocardial infarction [facility]) 03/2009   Acute myocardial infarction 2010 - treated with BMS of LCx. LVEF 50% with subsequent CHF  . Osteoporosis 09/2013   T -3.6 forearm 09/2013, T -4.5 forearm 04/2015    Patient Active Problem List   Diagnosis Date Noted  . Lumbar pain with radiation down left leg 03/02/2016  . ACS (acute coronary syndrome) ([place]) 02/24/2016  . Acute coronary syndrome ([place]) 02/24/2016  . Underweight 10/07/2015  . Vertigo 04/22/2015  . Skin nodule 04/22/2015  . Medicare annual wellness visit, subsequent 04/08/2015  . Advanced care planning/counseling discussion 04/08/2015  . Vitamin D insufficiency 02/02/2015  . CKD (chronic kidney disease) stage 3, GFR 30-59 ml/min   . History of CVA (cerebrovascular accident) without residual deficits   . Prediabetes   . Ischemic cardiomyopathy 02/27/2014  . Osteoporosis 09/24/2013  . COPD (chronic obstructive pulmonary disease) ([place]) 10/26/2012  . Pulmonary nodule, left 10/26/2012  . TIA (transient ischemic attack) 07/08/2012  . Cancer of lower lobe of right lung ([place]) 04/21/2012  . Chronic combined systolic and diastolic heart failure ([place]) 06/17/2009  . HLD (hyperlipidemia) 04/24/2009  . CAD, NATIVE VESSEL 04/24/2009  . GERD 04/24/2009  . Macular degeneration (senile) of retina 04/23/2009  . Essential hypertension 04/23/2009  . Osteoarthritis 04/23/2009    Past Surgical History:  Procedure Laterality  Date  . BREAST BIOPSY Left   . CARDIAC CATHETERIZATION N/A 02/24/2016   Procedure: Left Heart Cath and Coronary Angiography;  Surgeon: Belva Crome, MD;  Location: [facility];  Service: Cardiovascular;  Laterality: N/A;  . CATARACT EXTRACTION Bilateral   . LUNG REMOVAL, PARTIAL Right 2008  . PARTIAL HYSTERECTOMY  1986    irregular periods, ovaries remain    OB History    No data available       Home Medications    Prior to Admission medications   Medication Sig Start Date End Date Taking? Authorizing Provider  albuterol (PROAIR HFA) 108 (90 BASE) MCG/ACT inhaler Inhale 2 puffs into the lungs every 4 (four) hours as needed for wheezing or shortness of breath. 02/12/15  Yes Tammy S Parrett, NP  aspirin EC 81 MG tablet Take 1 tablet (81 mg total) by mouth daily. 02/26/16  Yes Arbutus Leas, NP  Calcium Carbonate-Vitamin D (CALCIUM-VITAMIN D) 500-200 MG-UNIT per tablet Take 2 tablets by mouth daily.   Yes Historical Provider, MD  carvedilol (COREG) 12.5 MG tablet Take 1 tablet (12.5 mg total) by mouth 2 (two) times daily with a meal. 02/26/16  Yes Arbutus Leas, NP  Cholecalciferol (VITAMIN D) 2000 UNITS CAPS Take 1 capsule by mouth daily.   Yes Historical Provider, MD  clopidogrel (PLAVIX) 75 MG tablet Take 1 tablet (75 mg total) by mouth daily. 02/26/16  Yes Arbutus Leas, NP  furosemide (LASIX) 40 MG tablet TAKE 1 TABLET BY MOUTH DAILY 05/30/15  Yes Sherren Mocha, MD  isosorbide mononitrate (IMDUR) 30 MG 24 hr tablet TAKE 1 TABLET BY MOUTH DAILY 11/19/15  Yes Sherren Mocha, MD  losartan (COZAAR) 50 MG tablet TAKE 1 TABLET BY MOUTH DAILY 05/30/15  Yes Sherren Mocha, MD  LOTEMAX 0.5 % ophthalmic suspension Place 1 drop into both eyes daily.  12/27/13  Yes Historical Provider, MD  pantoprazole (PROTONIX) 40 MG tablet Take 1 tablet (40 mg total) by mouth daily. 02/27/16  Yes Sherren Mocha, MD  simvastatin (ZOCOR) 40 MG tablet TAKE ONE TABLET BY MOUTH AT BEDTIME 05/14/15  Yes Sherren Mocha, MD  spironolactone (ALDACTONE) 25 MG tablet TAKE 1/2  TABLET BY MOUTH ONCE DAILY. 03/20/15  Yes Sherren Mocha, MD  denosumab (PROLIA) 60 MG/ML SOLN injection Inject 60 mg into the skin every 6 (six) months. Administer in upper arm, thigh, or abdomen 06/13/15   Ria Bush, MD  nitroGLYCERIN (NITROSTAT) 0.4 MG SL tablet Place 1  tablet (0.4 mg total) under the tongue every 5 (five) minutes as needed for chest pain. 07/03/15   Liliane Shi, PA-C    Family History Family History  Problem Relation Age of Onset  . CAD Mother 29    MI  . Stroke Mother   . Hypertension Mother   . Heart attack Mother   . CAD Father 59  . Stroke Father   . Breast cancer Sister   . Diabetes Sister     Social History Social History  Substance Use Topics  . Smoking status: Former Smoker    Packs/day: 2.00    Years: 40.00    Types: Cigarettes    Quit date: 07/27/2006  . Smokeless tobacco: Never Used  . Alcohol use No     Allergies   Actonel [risedronate sodium]; Amlodipine; Fosamax [alendronate sodium]; Lisinopril; Pravastatin; and Codeine   Review of Systems Review of Systems  Respiratory: Positive for cough and shortness of breath.   All other systems reviewed and  are negative.    Physical Exam Updated Vital Signs BP 106/61   Pulse (!) 40   Temp 97.5 F (36.4 C) (Oral)   Resp 23   Ht '5\' 6"'$  (1.676 m)   Wt 50.5 kg   SpO2 100%   BMI 17.98 kg/m   Physical Exam  Constitutional: She is oriented to person, place, and time. She appears well-developed and well-nourished. No distress.  HENT:  Head: Normocephalic and atraumatic.  Mouth/Throat: Uvula is midline, oropharynx is clear and moist and mucous membranes are normal. No oropharyngeal exudate, posterior oropharyngeal edema, posterior oropharyngeal erythema or tonsillar abscesses.  Eyes: Conjunctivae and EOM are normal. Right eye exhibits no discharge. Left eye exhibits no discharge. No scleral icterus.  Neck: Normal range of motion. Neck supple.  Cardiovascular: Regular rhythm, normal heart sounds and intact distal pulses.   Bradycardia, HR 55  Pulmonary/Chest: Effort normal. No respiratory distress. She has wheezes. She has no rales. She exhibits no tenderness.  Decreased breath sounds throughout with mild end-expiratory wheezes noted in upper lobes. O2 98%  on RA.  Abdominal: Soft. Bowel sounds are normal. She exhibits no distension and no mass. There is no tenderness. There is no rebound and no guarding. No hernia.  Musculoskeletal: Normal range of motion. She exhibits no edema.  Neurological: She is alert and oriented to person, place, and time.  Skin: Skin is warm and dry. She is not diaphoretic.  Nursing note and vitals reviewed.    ED Treatments / Results  Labs (all labs ordered are listed, but only abnormal results are displayed) Labs Reviewed  CBC WITH DIFFERENTIAL/PLATELET - Abnormal; Notable for the following:       Result Value   Eosinophils Absolute 0.8 (*)    All other components within normal limits  BASIC METABOLIC PANEL - Abnormal; Notable for the following:    Chloride 100 (*)    Glucose, Bld 110 (*)    Creatinine, Ser 1.10 (*)    GFR calc non Af Amer 48 (*)    GFR calc Af Amer 55 (*)    All other components within normal limits  D-DIMER, QUANTITATIVE (NOT AT [facility]) - Abnormal; Notable for the following:    D-Dimer, Quant 1.53 (*)    All other components within normal limits  I-STAT TROPOININ, ED    EKG  EKG Interpretation  Date/Time:  Wednesday March 11 2016 09:12:00 EDT Ventricular Rate:  48 PR Interval:    QRS Duration: 128 QT Interval:  544 QTC Calculation: 487 R Axis:   93 Text Interpretation:  Sinus bradycardia Nonspecific intraventricular conduction delay Probable lateral infarct, age indeterminate Anteroseptal infarct, age indeterminate Confirmed by COOK  MD, BRIAN ([id number]) on 03/11/2016 9:50:49 AM       Radiology Dg Chest 2 View  Result Date: 03/11/2016 CLINICAL DATA:  Shortness of Breath EXAM: CHEST  2 VIEW COMPARISON:  11/18/2015 FINDINGS: Cardiac shadow is within normal limits. Emphysematous changes are again seen similar to that noted on prior CT. No focal infiltrate or sizable effusion is seen. Postsurgical changes are noted in the right hilum stable from the prior exam. Stable fullness at the  surgical site on the right is noted. No acute bony abnormality is noted. IMPRESSION: No change from the prior exam. Electronically Signed   By: Inez Catalina M.D.   On: 03/11/2016 11:09   Ct Angio Chest Pe W And/or Wo Contrast  Result Date: 03/11/2016 CLINICAL DATA:  Acute onset of shortness of breath. Cough for 3 weeks.  Myocardial infarction 2 weeks ago. EXAM: CT ANGIOGRAPHY CHEST WITH CONTRAST TECHNIQUE: Multidetector CT imaging of the chest was performed using the standard protocol during bolus administration of intravenous contrast. Multiplanar CT image reconstructions and MIPs were obtained to evaluate the vascular anatomy. CONTRAST:  100 cc Isovue 370 COMPARISON:  Chest x-ray dated 03/11/2016 and CT scan dated 11/18/2015 FINDINGS: No pulmonary emboli. There is new obstruction of two segmental bronchi to the right lower lobe adjacent to a surgical staple line best seen on images 51 through 58 of series 8 and images 68 through 77 of series 9 and images 88 through 99 of series 5. The Stable emphysema with scarring at the lung apices as well as is stable scarring in the superior segment of the left lower lobe posteriorly and at the right lung base posteriorly. Heart size is normal. No acute bone abnormality. Aortic atherosclerosis. Coronary artery calcifications. Review of the MIP images confirms the above findings. IMPRESSION: 1. No pulmonary emboli. 2. Soft tissue fills 2 segmental bronchi in the right lower lobe next to a surgical staple line at the site of prior partial right lower lobectomy. While this could represent secretions or scarring from radiation therapy, the possibly of recurrent tumor should be considered. Electronically Signed   By: Lorriane Shire M.D.   On: 03/11/2016 13:41    Procedures Procedures (including critical care time)  Medications Ordered in ED Medications  albuterol (PROVENTIL) (2.5 MG/3ML) 0.083% nebulizer solution 5 mg (5 mg Nebulization Given 03/11/16 0923)  iopamidol  (ISOVUE-370) 76 % injection (100 mLs  Contrast Given 03/11/16 1304)     Initial Impression / Assessment and Plan / ED Course  I have reviewed the triage vital signs and the nursing notes.  Pertinent labs & imaging results that were available during my care of the patient were reviewed by me and considered in my medical decision making (see chart for details).  Clinical Course   Pt presents with SOB and productive cough that started this morning after she woke up. Denies fever or CP. Hx of CHF, COPD, HTN, CAD, CVA. VSS. On exam, decreased air flow noted throughout with mild end-expiratory wheezes noted in upper lobes, O2 97% on RA, pt without signs of respiratory distress, remaining exam unremarkable. Pt given albuterol neb tx.  EKG showed sinus bradycardia (HR 48) with T wave inversions in lateral leads. Trop negative. D-dimer 1.53. Remaining labs unremarkable. CXR negative. Chart review shows pt was admitted on 7/31 for STEMI and was taken straight to the cath lab. Reported new very distal LAD occlusion that was not amendable to PCI. Pt was started on Plavix, she is on long acting nitrates. Suspect pt's EKG abnormalities are likely due to recent STEMI.  I have a low suspicion for ACS, dissection, or other acute cardiac event at this time. Due to elevated d-dimer, will order CTA to evaluate for PE.  On reevaluation, pt reports her SOB has significantly improved since completing the neb tx. Lungs with much improved breath sounds throughout, no wheezing. CT angio negative for PE; showed soft tissue filling 2 segmental bronchi in the right lower lobe next to a surgical staple line at the site of prior partial right lower lobectomy,could represent secretions or scarring from radiation therapy, possibility of recurrent tumor should be considered.   On reevaluation pt denies any sxs at this time. VSS. Discussed CT findings with pt. She reports having an appointment with her oncologoist at the cancer center  in the next 1-2 months. She  also reports receiving CT chest yearly. Plan to d/c pt home with symptomatic tx and advise pt to follow up with her PCP within the next week. Discussed strict return precautions with.   Final Clinical Impressions(s) / ED Diagnoses   Final diagnoses:  SOB (shortness of breath)    New Prescriptions New Prescriptions   No medications on file     Nona Dell, PA-C 03/11/16 [place], MD 03/15/16 [place], MD 03/15/16 1740

## 2016-03-11 NOTE — ED Triage Notes (Signed)
BIB [facility] c/o of SOB onset of this AM. Did her metered dose inhaler 2 times this AM and sts that helped a very little bit. HY of CHF, COPD, STEMI on 02/27/2016 95%RA Walking sats: 94%-96%

## 2016-03-12 ENCOUNTER — Encounter (INDEPENDENT_AMBULATORY_CARE_PROVIDER_SITE_OTHER): Payer: Self-pay

## 2016-03-12 ENCOUNTER — Ambulatory Visit (INDEPENDENT_AMBULATORY_CARE_PROVIDER_SITE_OTHER): Payer: PPO | Admitting: Cardiology

## 2016-03-12 ENCOUNTER — Encounter: Payer: Self-pay | Admitting: Cardiology

## 2016-03-12 VITALS — BP 92/58 | HR 62 | Ht 66.0 in | Wt 114.0 lb

## 2016-03-12 DIAGNOSIS — I5022 Chronic systolic (congestive) heart failure: Secondary | ICD-10-CM

## 2016-03-12 MED ORDER — BISOPROLOL FUMARATE 5 MG PO TABS: 5.0000 mg | ORAL_TABLET | Freq: Every day | ORAL | 5 refills | Status: DC

## 2016-03-12 NOTE — Telephone Encounter (Signed)
Patient's niece,Julie,called.  Please call Almyra Free back.

## 2016-03-12 NOTE — Patient Instructions (Addendum)
Medication Instructions: Your physician has recommended you make the following change in your medication  1. STOP Carvedilol 2. START Bisoprolol 5 mg by mouth daily - new RX sent   Labwork: None Ordered  Procedures/Testing: Your physician has requested that you have an echocardiogram. Echocardiography is a painless test that uses sound waves to create images of your heart. It provides your doctor with information about the size and shape of your heart and how well your heart's chambers and valves are working. This procedure takes approximately one hour. There are no restrictions for this procedure.   Follow-Up: Your physician recommends that you schedule a follow-up appointment in 6-8 weeks with Dr. Burt Knack  Any Additional Special Instructions Will Be Listed Below (If Applicable).     If you need a refill on your cardiac medications before your next appointment, please call your pharmacy.

## 2016-03-12 NOTE — Telephone Encounter (Signed)
Spoke with [name]. She had questions about how to get a nebulizer. Advised this would be addressed at patient's appt bc she may not even need one. She verbalized understanding.

## 2016-03-12 NOTE — Progress Notes (Signed)
03/12/2016 [name]   [birth year]  [id number]  Primary Physician Ria Bush, MD Primary Cardiologist: Dr. Burt Knack    Reason for Visit/CC: [facility] F/u for CAD, S/P STEMI  HPI:  [name] is a 75 year old female with a past medical history of HTN, HLD, ischemic cardiomyopathy (EF 35% by cath 02/2016) , CAD (BMS to circumflex in 2010), and chronic combined systolic and diastolic CHF. She presented to the ED on 02/24/16 with chest pain that awakened her from sleep. She called EMS and EKG showed ST elevation in lateral leads. Code STEMI was called and she was taken urgently to the cath lab.   She was found to have a widely, previously placed circumflex stent. 65% proximal LAD stenosis that was nonobstructive as well as the 5% first diagonal lesion. This was also nonobstructive. Both lesions were unchanged compared to previous cardiac catheterization. She was found to have a new, very distal stenosis in her LAD which was felt to be the culprit vessel. This was 100% stenosis and a small tortuous segment. This was not amendable to PCI. Plavix was added to her medication regimen. She will continue dual antiplatelet therapy indefinitely. Her EF was 35% by cath, spironolactone was added and her Coreg was titrated up to 12.'5mg'$  BID. She had some periods of ventricular trigeminy during her hospitalization. She has a history of COPD. It was noted that if she develops wheezing on her increased dose of Coreg, we can switch her to bisoprolol. Her last echo was in 2013. At that time she had an EF of 45-50% with akinesis of the posterior wall. Her EF was estimated at 35% by cath this admission. Outpatient echo was recommended.She will continue on 30 mg of isosorbide to prevent further angina. Her creatinine was stable throughout admission as she does carry a diagnosis of chronic kidney disease stage III.  She presents back to clinic today for post hospital follow-up. She is accompanied by her husband.  She has had issues with dyspnea. She went to the [facility] ED yesterday with severe dyspnea + wheezing. EKG nonacute. Troponin negative. D-dimer was abnormal, however Chest CT was negative for PE. CXR was unremarkable. She notes she was given a breathing treatment her dyspnea immediately improved.  She is currently w/o dyspnea in clinic and denies recurrent CP. She does however have wheezing on lung exam. She reports full medication compliance. VSS.   Current Outpatient Prescriptions  Medication Sig Dispense Refill  . albuterol (PROAIR HFA) 108 (90 BASE) MCG/ACT inhaler Inhale 2 puffs into the lungs every 4 (four) hours as needed for wheezing or shortness of breath. 1 Inhaler 1  . aspirin EC 81 MG tablet Take 1 tablet (81 mg total) by mouth daily.    . Calcium Carbonate-Vitamin D (CALCIUM-VITAMIN D) 500-200 MG-UNIT per tablet Take 2 tablets by mouth daily.    . Cholecalciferol (VITAMIN D) 2000 UNITS CAPS Take 1 capsule by mouth daily.    . clopidogrel (PLAVIX) 75 MG tablet Take 1 tablet (75 mg total) by mouth daily. 30 tablet 12  . denosumab (PROLIA) 60 MG/ML SOLN injection Inject 60 mg into the skin every 6 (six) months. Administer in upper arm, thigh, or abdomen 60 mL 1  . furosemide (LASIX) 40 MG tablet TAKE 1 TABLET BY MOUTH DAILY 30 tablet 11  . isosorbide mononitrate (IMDUR) 30 MG 24 hr tablet TAKE 1 TABLET BY MOUTH DAILY 30 tablet 3  . losartan (COZAAR) 50 MG tablet TAKE 1 TABLET BY MOUTH  DAILY 30 tablet 11  . LOTEMAX 0.5 % ophthalmic suspension Place 1 drop into both eyes daily.     . nitroGLYCERIN (NITROSTAT) 0.4 MG SL tablet Place 1 tablet (0.4 mg total) under the tongue every 5 (five) minutes as needed for chest pain. 25 tablet 3  . pantoprazole (PROTONIX) 40 MG tablet Take 1 tablet (40 mg total) by mouth daily. 30 tablet 11  . simvastatin (ZOCOR) 40 MG tablet TAKE ONE TABLET BY MOUTH AT BEDTIME 30 tablet 11  . spironolactone (ALDACTONE) 25 MG tablet TAKE 1/2  TABLET BY MOUTH ONCE DAILY. 30  tablet 6  . bisoprolol (ZEBETA) 5 MG tablet Take 1 tablet (5 mg total) by mouth daily. 30 tablet 5   No current facility-administered medications for this visit.     Allergies  Allergen Reactions  . Actonel [Risedronate Sodium] Other (See Comments)    Headache  . Amlodipine Other (See Comments)    Pedal edema  . Fosamax [Alendronate Sodium] Other (See Comments)    Unable to tolerate  . Lisinopril Cough  . Pravastatin Other (See Comments)    Constipation.  . Codeine Rash    Social History   Social History  . Marital status: Widowed    Spouse name: N/A  . Number of children: 1  . Years of education: N/A   Occupational History  . retired     Insurance underwriter   Social History Main Topics  . Smoking status: Former Smoker    Packs/day: 2.00    Years: 40.00    Types: Cigarettes    Quit date: 07/27/2006  . Smokeless tobacco: Never Used  . Alcohol use No  . Drug use: No  . Sexual activity: No   Other Topics Concern  . Not on file   Social History Narrative   The patient is a widow and has one son and one dog.     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  She does not drink alcohol. 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   Ed: HS   Activity: no regular exercise   Diet: some water, some fruits/vegetables    Lives in a one story home.      Review of Systems: General: negative for chills, fever, night sweats or weight changes.  Cardiovascular: negative for chest pain, dyspnea on exertion, edema, orthopnea, palpitations, paroxysmal nocturnal dyspnea or shortness of breath Dermatological: negative for rash Respiratory: negative for cough or wheezing Urologic: negative for hematuria Abdominal: negative for nausea, vomiting, diarrhea, bright red blood per rectum, melena, or hematemesis Neurologic: negative for visual changes, syncope, or dizziness All other systems reviewed and are otherwise negative except as noted  above.    Blood pressure (!) 92/58, pulse 62, height '5\' 6"'$  (1.676 m), weight 114 lb (51.7 kg).  General appearance: alert, cooperative and no distress Neck: no carotid bruit and no JVD Lungs: clear to auscultation bilaterally Heart: regular rate and rhythm, S1, S2 normal, no murmur, click, rub or gallop Extremities: no LEE Pulses: 2+ and symmetric Skin: warm and dry Neurologic: Grossly normal  EKG SR with PVCs 62 bpm.   ASSESSMENT AND PLAN:   1. CAD: s/p STEMI secondary to a very distal totally occluded LAD, in a small tortuous segment, not amendable to PCI. Also with 65% proximal LAD and a 65%1st Diagn, both nonosbstructive and unchanged from prior cath. Continue medical therapy with ASA + Plavix, Imdur  and losartan. Given her COPD and bronchospasms, leading to dyspnea and wheezing, we will convert to a cardioselective BB, bisoprolol. I've discussed conversion dosing with pharmacist. We wil/l change to bisoprolol 5 mg daily.   2. Cardiomyopathy/ Chronic Systolic HF: change in EF, based on cath estimate from 45-50%, down to 35% by cath estimate. We will check a 2D echo to better assess her LVF. Continue BB and ARB therapy + spironolactone and nitrate. Her volume status is stable with current dose of lasix. Low sodium diet advised.    PLAN  F/u with Dr. Burt Knack in 4-6 weeks.   Lyda Jester PA-C 03/12/2016 2:35 PM

## 2016-03-13 ENCOUNTER — Telehealth: Payer: Self-pay | Admitting: Family Medicine

## 2016-03-13 ENCOUNTER — Encounter: Payer: Self-pay | Admitting: Cardiovascular Disease

## 2016-03-13 MED ORDER — BISOPROLOL FUMARATE 5 MG PO TABS: 2.5000 mg | ORAL_TABLET | Freq: Every day | ORAL | Status: DC

## 2016-03-13 NOTE — Telephone Encounter (Signed)
This encounter was created in error - please disregard.

## 2016-03-13 NOTE — Telephone Encounter (Signed)
I spoke with the pt's niece per the pt's permission.  I had spoken with Lyda Jester PA-C after initial phone call and she advised that the pt should decrease Bisoprolol to 2.'5mg'$  once a day. Per Almyra Free the pt's BP is lower now and the pt complains of blurred vision, HA and dizziness.  I reviewed the pt's medications and the only thing the pt has not taken today is her spironolactone.  I advised that she hold this medication for today. I advised that the pt lie down and elevate her legs, drink a few glasses of water and eat a salty snack.  If the pt's BP continues to drop and symptoms worsen then they should contact EMS immediately. Almyra Free agreed with plan and will contact the office with any other questions or concerns.

## 2016-03-13 NOTE — Telephone Encounter (Signed)
Iris D Becton at 03/13/2016 4:13 PM   Status: Signed    New message      Pt c/o BP issue: STAT if pt c/o blurred vision, one-sided weakness or slurred speech  1. What are your last 5 BP readings? 80/43, 78/45  2. Are you having any other symptoms (ex. Dizziness, headache, blurred vision, passed out)? Headache and dizziness  3. What is your BP issue? Vision blurred

## 2016-03-13 NOTE — Telephone Encounter (Signed)
Pt c/o BP issue: STAT if pt c/o blurred vision, one-sided weakness or slurred speech  1. What are your last 5 BP readings? 90/45  80/50 2. Are you having any other symptoms (ex. Dizziness, headache, blurred vision, passed out)? izziness, headache  3. What is your BP issue? Its low

## 2016-03-13 NOTE — Telephone Encounter (Signed)
Noted referred to ER.

## 2016-03-13 NOTE — Telephone Encounter (Signed)
Niece spoke with Ria Bush, MD and pt referred to the ER.

## 2016-03-13 NOTE — Telephone Encounter (Signed)
New message      Pt c/o BP issue: STAT if pt c/o blurred vision, one-sided weakness or slurred speech  1. What are your last 5 BP readings? 80/43, 78/45  2. Are you having any other symptoms (ex. Dizziness, headache, blurred vision, passed out)? Headache and dizziness  3. What is your BP issue? Vision blurred

## 2016-03-13 NOTE — Telephone Encounter (Signed)
PLEASE NOTE: All timestamps contained within this report are represented as [place] Standard Time. CONFIDENTIALTY NOTICE: This fax transmission is intended only for the addressee. It contains information that is legally privileged, confidential or otherwise protected from use or disclosure. If you are not the intended recipient, you are strictly prohibited from reviewing, disclosing, copying using or disseminating any of this information or taking any action in reliance on or regarding this information. If you have received this fax in error, please notify us immediately by telephone so that we can arrange for its return to [place]. Phone: [phone], Toll-Free: [phone], Fax: [phone] Page: 1 of 1 Call Id: [id number] [place] Patient Name: [name] DOB: 03-11-1941 Initial Comment Caller states her aunt saw her cardiologist yesterday after being at the ER the day before. Her BP is now 80/50. She is dizzy and has a headache. Nurse Assessment Nurse: Dimas Chyle, RN, Dellis Filbert Date/Time Eilene Ghazi Time): 03/13/2016 3:47:21 PM Confirm and document reason for call. If symptomatic, describe symptoms. You must click the next button to save text entered. ---Caller states her aunt saw her cardiologist yesterday after being at the ER the day before. Her BP is now 80/50. She is dizzy and has a headache. Has the patient traveled out of the country within the last 30 days? ---No Does the patient have any new or worsening symptoms? ---Yes Will a triage be completed? ---Yes Related visit to physician within the last 2 weeks? ---Yes Does the PT have any chronic conditions? (i.e. diabetes, asthma, etc.) ---Yes List chronic conditions. ---Hx of CVA, MI, HTN Is this a behavioral health or substance abuse call? ---No Guidelines Guideline Title Affirmed Question Affirmed Notes Low Blood Pressure [3] Fall in  systolic BP > 20 mm Hg from normal AND [2] dizzy, lightheaded, or weak Final Disposition User Go to ED Now (or PCP triage) Dimas Chyle, RN, [facility] - ED Disagree/Comply: Comply

## 2016-03-16 ENCOUNTER — Ambulatory Visit (INDEPENDENT_AMBULATORY_CARE_PROVIDER_SITE_OTHER): Payer: PPO | Admitting: Family Medicine

## 2016-03-16 ENCOUNTER — Encounter: Payer: Self-pay | Admitting: Family Medicine

## 2016-03-16 VITALS — BP 100/56 | HR 48 | Temp 98.1°F | Wt 115.2 lb

## 2016-03-16 DIAGNOSIS — M810 Age-related osteoporosis without current pathological fracture: Secondary | ICD-10-CM | POA: Diagnosis not present

## 2016-03-16 DIAGNOSIS — N183 Chronic kidney disease, stage 3 unspecified: Secondary | ICD-10-CM

## 2016-03-16 DIAGNOSIS — J449 Chronic obstructive pulmonary disease, unspecified: Secondary | ICD-10-CM

## 2016-03-16 DIAGNOSIS — I25119 Atherosclerotic heart disease of native coronary artery with unspecified angina pectoris: Secondary | ICD-10-CM | POA: Diagnosis not present

## 2016-03-16 DIAGNOSIS — C3431 Malignant neoplasm of lower lobe, right bronchus or lung: Secondary | ICD-10-CM

## 2016-03-16 MED ORDER — TIOTROPIUM BROMIDE MONOHYDRATE 18 MCG IN CAPS: 18.0000 ug | ORAL_CAPSULE | Freq: Every day | RESPIRATORY_TRACT | 12 refills | Status: DC

## 2016-03-16 NOTE — Patient Instructions (Addendum)
Start ensure or boost 1/2-1 shake daily before lunch.  Decrease losartan to 1/2 tablet ('25mg'$ ) daily.  Start spiriva once daily for COPD. Follow up with Dr Lamonte Sakai. Hold prolia for another month. Good to see you today.

## 2016-03-16 NOTE — Assessment & Plan Note (Signed)
Due for prolia injection, however will defer given recent STEMI. Consider injection next month.

## 2016-03-16 NOTE — Assessment & Plan Note (Signed)
Recent abnormal CTA, pt aware and she will schedule f/u with pulm.

## 2016-03-16 NOTE — Progress Notes (Signed)
BP (!) 100/56   Pulse (!) 48   Temp 98.1 F (36.7 C) (Oral)   Wt 115 lb 4 oz (52.3 kg)   BMI 18.60 kg/m    CC: ER f/u visit Subjective:    Patient ID: [name], female    DOB: May 04, 1941, 75 y.o.   MRN: [id number]  HPI: [name] is a 75 y.o. female presenting on 03/16/2016 for Follow-up [facility], heart attack and difficutly breathing)   Recent hospitalization for STEMI 02/26/2016 - found to have distal LAD stenosis not amenable to PCI. plavix was added to aspirin '81mg'$ . Spironolactone was started, coreg was titrated.   Recent ER visit for ongoing hypotension with cough/dyspnea. D dimer was elevated, CTA did not reveal any PE. Treated with nebulization and noted significant improvement. Discharged home with close cards f/u. Seen by cardiology clinic yesterday - carvedilol was changed to bisoprolol.   Recent CTA also showed soft tissue filling 2 segmental bronchi RLL near site of prior surgery - she has f/u with onc scheduled later this year.   Known COPD in smoker - last saw Dr Lamonte Sakai. At that time, felt stable on SABA, with consideration of future LAMA if needed.   Relevant past medical, surgical, family and social history reviewed and updated as indicated. Interim medical history since our last visit reviewed. Allergies and medications reviewed and updated. Current Outpatient Prescriptions on File Prior to Visit  Medication Sig  . albuterol (PROAIR HFA) 108 (90 BASE) MCG/ACT inhaler Inhale 2 puffs into the lungs every 4 (four) hours as needed for wheezing or shortness of breath.  Marland Kitchen aspirin EC 81 MG tablet Take 1 tablet (81 mg total) by mouth daily.  . bisoprolol (ZEBETA) 5 MG tablet Take 0.5 tablets (2.5 mg total) by mouth daily.  . Calcium Carbonate-Vitamin D (CALCIUM-VITAMIN D) 500-200 MG-UNIT per tablet Take 2 tablets by mouth daily.  . Cholecalciferol (VITAMIN D) 2000 UNITS CAPS Take 1 capsule by mouth daily.  . clopidogrel (PLAVIX) 75 MG tablet Take 1 tablet (75  mg total) by mouth daily.  Marland Kitchen denosumab (PROLIA) 60 MG/ML SOLN injection Inject 60 mg into the skin every 6 (six) months. Administer in upper arm, thigh, or abdomen  . furosemide (LASIX) 40 MG tablet TAKE 1 TABLET BY MOUTH DAILY  . isosorbide mononitrate (IMDUR) 30 MG 24 hr tablet TAKE 1 TABLET BY MOUTH DAILY  . LOTEMAX 0.5 % ophthalmic suspension Place 1 drop into both eyes daily.   . nitroGLYCERIN (NITROSTAT) 0.4 MG SL tablet Place 1 tablet (0.4 mg total) under the tongue every 5 (five) minutes as needed for chest pain.  . pantoprazole (PROTONIX) 40 MG tablet Take 1 tablet (40 mg total) by mouth daily.  . simvastatin (ZOCOR) 40 MG tablet TAKE ONE TABLET BY MOUTH AT BEDTIME  . spironolactone (ALDACTONE) 25 MG tablet TAKE 1/2  TABLET BY MOUTH ONCE DAILY. (Patient not taking: Reported on 03/16/2016)   No current facility-administered medications on file prior to visit.     Review of Systems Per HPI unless specifically indicated in ROS section     Objective:    BP (!) 100/56   Pulse (!) 48   Temp 98.1 F (36.7 C) (Oral)   Wt 115 lb 4 oz (52.3 kg)   BMI 18.60 kg/m   Wt Readings from Last 3 Encounters:  03/16/16 115 lb 4 oz (52.3 kg)  03/12/16 114 lb (51.7 kg)  03/11/16 111 lb 6.4 oz (50.5 kg)    Physical  Exam  Constitutional: She appears well-developed and well-nourished. No distress.  HENT:  Head: Normocephalic and atraumatic.  Mouth/Throat: Oropharynx is clear and moist. No oropharyngeal exudate.  Eyes: Conjunctivae and EOM are normal. Pupils are equal, round, and reactive to light. No scleral icterus.  Neck: Normal range of motion. Neck supple.  Cardiovascular: Normal rate, regular rhythm, normal heart sounds and intact distal pulses.   No murmur heard. Pulmonary/Chest: Effort normal and breath sounds normal. No respiratory distress. She has no wheezes. She has no rales.  Musculoskeletal: She exhibits no edema.  Skin: Skin is warm and dry. No rash noted.  Psychiatric: She has  a normal mood and affect.  Nursing note and vitals reviewed.  Results for orders placed or performed during the hospital encounter of 03/11/16  CBC with Differential  Result Value Ref Range   WBC 10.4 4.0 - 10.5 K/uL   RBC 4.17 3.87 - 5.11 MIL/uL   Hemoglobin 13.0 12.0 - 15.0 g/dL   HCT 40.2 36.0 - 46.0 %   MCV 96.4 78.0 - 100.0 fL   MCH 31.2 26.0 - 34.0 pg   MCHC 32.3 30.0 - 36.0 g/dL   RDW 13.6 11.5 - 15.5 %   Platelets 250 150 - 400 K/uL   Neutrophils Relative % 70 %   Neutro Abs 7.3 1.7 - 7.7 K/uL   Lymphocytes Relative 18 %   Lymphs Abs 1.9 0.7 - 4.0 K/uL   Monocytes Relative 4 %   Monocytes Absolute 0.5 0.1 - 1.0 K/uL   Eosinophils Relative 8 %   Eosinophils Absolute 0.8 (H) 0.0 - 0.7 K/uL   Basophils Relative 0 %   Basophils Absolute 0.0 0.0 - 0.1 K/uL  Basic metabolic panel  Result Value Ref Range   Sodium 136 135 - 145 mmol/L   Potassium 4.2 3.5 - 5.1 mmol/L   Chloride 100 (L) 101 - 111 mmol/L   CO2 31 22 - 32 mmol/L   Glucose, Bld 110 (H) 65 - 99 mg/dL   BUN 18 6 - 20 mg/dL   Creatinine, Ser 1.10 (H) 0.44 - 1.00 mg/dL   Calcium 10.1 8.9 - 10.3 mg/dL   GFR calc non Af Amer 48 (L) >60 mL/min   GFR calc Af Amer 55 (L) >60 mL/min   Anion gap 5 5 - 15  D-dimer, quantitative  Result Value Ref Range   D-Dimer, Quant 1.53 (H) 0.00 - 0.50 ug/mL-FEU  I-stat troponin, ED  Result Value Ref Range   Troponin i, poc 0.00 0.00 - 0.08 ng/mL   Comment 3              Assessment & Plan:   Problem List Items Addressed This Visit    CAD, NATIVE VESSEL - Primary    With recent STEMI. Cardiac meds changed, has close f/u with cardiology. Appreciate their care. Ongoing hypotension - will decrease losartan to '25mg'$  daily.       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      CKD (chronic kidney disease) stage 3, GFR 30-59 ml/min    Concern for  over-treatment of hypertension - will decrease losartan to '25mg'$  daily.      COPD (chronic obstructive pulmonary disease) (HCC)    Stable period, however now with episodes of worsening dyspnea and wheezing unresponsive to albuterol inhaler. ?relation to coreg -  now on more selective beta blocker and dyspnea has improved, exam stable today. Discussed options of PRN duoneb vs daily spiriva - will start spiriva. Written Rx provided for pt to price out.      Relevant Medications   tiotropium (SPIRIVA HANDIHALER) 18 MCG inhalation capsule   Osteoporosis    Due for prolia injection, however will defer given recent STEMI. Consider injection next month.        Other Visit Diagnoses   None.      Follow up plan: Return in about 4 weeks (around 04/13/2016), or as needed, for follow up visit.  Ria Bush, MD

## 2016-03-16 NOTE — Progress Notes (Signed)
Pre visit review using our clinic review tool, if applicable. No additional management support is needed unless otherwise documented below in the visit note. 

## 2016-03-16 NOTE — Assessment & Plan Note (Signed)
Concern for over-treatment of hypertension - will decrease losartan to '25mg'$  daily.

## 2016-03-16 NOTE — Assessment & Plan Note (Addendum)
Stable period, however now with episodes of worsening dyspnea and wheezing unresponsive to albuterol inhaler. ?relation to coreg - now on more selective beta blocker and dyspnea has improved, exam stable today. Discussed options of PRN duoneb vs daily spiriva - will start spiriva. Written Rx provided for pt to price out.

## 2016-03-16 NOTE — Assessment & Plan Note (Signed)
With recent STEMI. Cardiac meds changed, has close f/u with cardiology. Appreciate their care. Ongoing hypotension - will decrease losartan to '25mg'$  daily.

## 2016-03-17 NOTE — Telephone Encounter (Signed)
I have rec'd [name]' insurance verification for Prolia and HTA is requiring a prior authorization.  I have completed p/a form and faxed it along w/Dexa scan from 05/24/2015 to them and will let you know once I have a response.  Thank you!

## 2016-03-18 ENCOUNTER — Other Ambulatory Visit: Payer: Self-pay

## 2016-03-18 ENCOUNTER — Emergency Department (HOSPITAL_COMMUNITY): Payer: PPO

## 2016-03-18 ENCOUNTER — Encounter (HOSPITAL_COMMUNITY): Payer: Self-pay | Admitting: Emergency Medicine

## 2016-03-18 ENCOUNTER — Inpatient Hospital Stay (HOSPITAL_COMMUNITY)
Admission: EM | Admit: 2016-03-18 | Discharge: 2016-03-21 | DRG: 190 | Disposition: A | Discharge status: Home Health | Payer: PPO | Attending: Internal Medicine | Admitting: Internal Medicine

## 2016-03-18 DIAGNOSIS — Z79899 Other long term (current) drug therapy: Secondary | ICD-10-CM

## 2016-03-18 DIAGNOSIS — K219 Gastro-esophageal reflux disease without esophagitis: Secondary | ICD-10-CM | POA: Diagnosis not present

## 2016-03-18 DIAGNOSIS — C3431 Malignant neoplasm of lower lobe, right bronchus or lung: Secondary | ICD-10-CM | POA: Diagnosis present

## 2016-03-18 DIAGNOSIS — Z85118 Personal history of other malignant neoplasm of bronchus and lung: Secondary | ICD-10-CM | POA: Diagnosis not present

## 2016-03-18 DIAGNOSIS — Z955 Presence of coronary angioplasty implant and graft: Secondary | ICD-10-CM | POA: Diagnosis not present

## 2016-03-18 DIAGNOSIS — Z87891 Personal history of nicotine dependence: Secondary | ICD-10-CM | POA: Diagnosis not present

## 2016-03-18 DIAGNOSIS — Y95 Nosocomial condition: Secondary | ICD-10-CM | POA: Diagnosis present

## 2016-03-18 DIAGNOSIS — Z8673 Personal history of transient ischemic attack (TIA), and cerebral infarction without residual deficits: Secondary | ICD-10-CM | POA: Diagnosis not present

## 2016-03-18 DIAGNOSIS — Z7902 Long term (current) use of antithrombotics/antiplatelets: Secondary | ICD-10-CM

## 2016-03-18 DIAGNOSIS — E440 Moderate protein-calorie malnutrition: Secondary | ICD-10-CM | POA: Diagnosis present

## 2016-03-18 DIAGNOSIS — J449 Chronic obstructive pulmonary disease, unspecified: Secondary | ICD-10-CM | POA: Diagnosis present

## 2016-03-18 DIAGNOSIS — I10 Essential (primary) hypertension: Secondary | ICD-10-CM | POA: Diagnosis present

## 2016-03-18 DIAGNOSIS — H3530 Unspecified macular degeneration: Secondary | ICD-10-CM | POA: Diagnosis present

## 2016-03-18 DIAGNOSIS — Z681 Body mass index (BMI) 19 or less, adult: Secondary | ICD-10-CM

## 2016-03-18 DIAGNOSIS — I130 Hypertensive heart and chronic kidney disease with heart failure and stage 1 through stage 4 chronic kidney disease, or unspecified chronic kidney disease: Secondary | ICD-10-CM | POA: Diagnosis not present

## 2016-03-18 DIAGNOSIS — I5042 Chronic combined systolic (congestive) and diastolic (congestive) heart failure: Secondary | ICD-10-CM | POA: Diagnosis present

## 2016-03-18 DIAGNOSIS — I255 Ischemic cardiomyopathy: Secondary | ICD-10-CM | POA: Diagnosis present

## 2016-03-18 DIAGNOSIS — N183 Chronic kidney disease, stage 3 (moderate): Secondary | ICD-10-CM | POA: Diagnosis present

## 2016-03-18 DIAGNOSIS — J9601 Acute respiratory failure with hypoxia: Secondary | ICD-10-CM | POA: Diagnosis not present

## 2016-03-18 DIAGNOSIS — R918 Other nonspecific abnormal finding of lung field: Secondary | ICD-10-CM

## 2016-03-18 DIAGNOSIS — I959 Hypotension, unspecified: Secondary | ICD-10-CM | POA: Diagnosis not present

## 2016-03-18 DIAGNOSIS — J441 Chronic obstructive pulmonary disease with (acute) exacerbation: Secondary | ICD-10-CM | POA: Diagnosis present

## 2016-03-18 DIAGNOSIS — J440 Chronic obstructive pulmonary disease with acute lower respiratory infection: Secondary | ICD-10-CM | POA: Diagnosis not present

## 2016-03-18 DIAGNOSIS — I252 Old myocardial infarction: Secondary | ICD-10-CM

## 2016-03-18 DIAGNOSIS — R069 Unspecified abnormalities of breathing: Secondary | ICD-10-CM | POA: Diagnosis not present

## 2016-03-18 DIAGNOSIS — Z7982 Long term (current) use of aspirin: Secondary | ICD-10-CM

## 2016-03-18 DIAGNOSIS — I2510 Atherosclerotic heart disease of native coronary artery without angina pectoris: Secondary | ICD-10-CM | POA: Diagnosis not present

## 2016-03-18 DIAGNOSIS — E785 Hyperlipidemia, unspecified: Secondary | ICD-10-CM | POA: Diagnosis present

## 2016-03-18 DIAGNOSIS — I5043 Acute on chronic combined systolic (congestive) and diastolic (congestive) heart failure: Secondary | ICD-10-CM | POA: Diagnosis present

## 2016-03-18 DIAGNOSIS — J189 Pneumonia, unspecified organism: Secondary | ICD-10-CM | POA: Diagnosis present

## 2016-03-18 DIAGNOSIS — R269 Unspecified abnormalities of gait and mobility: Secondary | ICD-10-CM | POA: Diagnosis not present

## 2016-03-18 DIAGNOSIS — I2102 ST elevation (STEMI) myocardial infarction involving left anterior descending coronary artery: Secondary | ICD-10-CM

## 2016-03-18 DIAGNOSIS — R0602 Shortness of breath: Secondary | ICD-10-CM | POA: Diagnosis not present

## 2016-03-18 LAB — CBC WITH DIFFERENTIAL/PLATELET
BASOS PCT: 1 %
Basophils Absolute: 0 10*3/uL (ref 0.0–0.1)
EOS ABS: 0.6 10*3/uL (ref 0.0–0.7)
Eosinophils Relative: 10 %
HCT: 34.4 % — ABNORMAL LOW (ref 36.0–46.0)
HEMOGLOBIN: 11.3 g/dL — AB (ref 12.0–15.0)
Lymphocytes Relative: 31 %
Lymphs Abs: 2 10*3/uL (ref 0.7–4.0)
MCH: 31.1 pg (ref 26.0–34.0)
MCHC: 32.8 g/dL (ref 30.0–36.0)
MCV: 94.8 fL (ref 78.0–100.0)
MONO ABS: 0.5 10*3/uL (ref 0.1–1.0)
MONOS PCT: 7 %
NEUTROS PCT: 51 %
Neutro Abs: 3.3 10*3/uL (ref 1.7–7.7)
PLATELETS: 197 10*3/uL (ref 150–400)
RBC: 3.63 MIL/uL — ABNORMAL LOW (ref 3.87–5.11)
RDW: 13.6 % (ref 11.5–15.5)
WBC: 6.4 10*3/uL (ref 4.0–10.5)

## 2016-03-18 LAB — COMPREHENSIVE METABOLIC PANEL
ALBUMIN: 3.1 g/dL — AB (ref 3.5–5.0)
ALT: 37 U/L (ref 14–54)
ANION GAP: 6 (ref 5–15)
AST: 47 U/L — ABNORMAL HIGH (ref 15–41)
Alkaline Phosphatase: 77 U/L (ref 38–126)
BUN: 14 mg/dL (ref 6–20)
CHLORIDE: 103 mmol/L (ref 101–111)
CO2: 29 mmol/L (ref 22–32)
Calcium: 9.2 mg/dL (ref 8.9–10.3)
Creatinine, Ser: 1.02 mg/dL — ABNORMAL HIGH (ref 0.44–1.00)
GFR calc Af Amer: >60 mL/min (ref >60)
GFR calc non Af Amer: 52 mL/min — ABNORMAL LOW (ref >60)
GLUCOSE: 118 mg/dL — AB (ref 65–99)
POTASSIUM: 3.6 mmol/L (ref 3.5–5.1)
SODIUM: 138 mmol/L (ref 135–145)
Total Bilirubin: 0.4 mg/dL (ref 0.3–1.2)
Total Protein: 6.4 g/dL — ABNORMAL LOW (ref 6.5–8.1)

## 2016-03-18 LAB — I-STAT CHEM 8, ED
BUN: 16 mg/dL (ref 6–20)
CHLORIDE: 99 mmol/L — AB (ref 101–111)
Calcium, Ion: 1.2 mmol/L (ref 1.12–1.23)
Creatinine, Ser: 0.9 mg/dL (ref 0.44–1.00)
Glucose, Bld: 116 mg/dL — ABNORMAL HIGH (ref 65–99)
HEMATOCRIT: 34 % — AB (ref 36.0–46.0)
Hemoglobin: 11.6 g/dL — ABNORMAL LOW (ref 12.0–15.0)
Potassium: 3.6 mmol/L (ref 3.5–5.1)
SODIUM: 138 mmol/L (ref 135–145)
TCO2: 27 mmol/L (ref 0–100)

## 2016-03-18 LAB — I-STAT TROPONIN, ED: Troponin i, poc: 0.01 ng/mL (ref 0.00–0.08)

## 2016-03-18 LAB — I-STAT CG4 LACTIC ACID, ED: LACTIC ACID, VENOUS: 1.5 mmol/L (ref 0.5–1.9)

## 2016-03-18 MED ORDER — VANCOMYCIN HCL IN DEXTROSE 1-5 GM/200ML-% IV SOLN
1000.0000 mg | Freq: Once | INTRAVENOUS | Status: AC
  Administered 2016-03-19: 1000 mg via INTRAVENOUS
  Filled 2016-03-18: qty 200

## 2016-03-18 MED ORDER — PIPERACILLIN-TAZOBACTAM 3.375 G IVPB 30 MIN
3.3750 g | Freq: Once | INTRAVENOUS | Status: AC
  Administered 2016-03-19: 3.375 g via INTRAVENOUS
  Filled 2016-03-18: qty 50

## 2016-03-18 MED ORDER — ALBUTEROL (5 MG/ML) CONTINUOUS INHALATION SOLN
15.0000 mg/h | INHALATION_SOLUTION | Freq: Once | RESPIRATORY_TRACT | Status: AC
  Administered 2016-03-18: 15 mg/h via RESPIRATORY_TRACT
  Filled 2016-03-18: qty 20

## 2016-03-18 MED ORDER — MAGNESIUM SULFATE 2 GM/50ML IV SOLN: 2.0000 g | Freq: Once | INTRAVENOUS | Status: DC

## 2016-03-18 MED ORDER — IPRATROPIUM BROMIDE 0.02 % IN SOLN
0.5000 mg | Freq: Once | RESPIRATORY_TRACT | Status: AC
  Administered 2016-03-18: 0.5 mg via RESPIRATORY_TRACT
  Filled 2016-03-18: qty 2.5

## 2016-03-18 MED ORDER — MAGNESIUM SULFATE 2 GM/50ML IV SOLN
2.0000 g | Freq: Once | INTRAVENOUS | Status: AC
  Administered 2016-03-18: 2 g via INTRAVENOUS
  Filled 2016-03-18: qty 50

## 2016-03-18 NOTE — Progress Notes (Signed)
Daily Session Note  Patient Details  Name: [name] MRN: [id number] Date of Birth: 1940/08/31 Referring Provider:   Flowsheet Row Cardiac Rehab from 03/09/2016 in [facility] Card[facility]  Referring Provider  Sherren Mocha MD      Encounter Date: 03/18/2016  Check In:     Session Check In - 03/18/16 1718      Check-In   Location [facility]-Cardiac & Pulmonary Rehab   Staff Present Nyoka Cowden, RN, BSN, Willette Pa, MA, ACSM RCEP, Exercise Physiologist;Amanda Oletta Darter, [place], ACSM CEP, Exercise Physiologist   Supervising physician immediately available to respond to emergencies See telemetry face sheet for immediately available ER MD   Medication changes reported     No   Fall or balance concerns reported    No   Warm-up and Cool-down Performed on first and last piece of equipment   Resistance Training Performed Yes   VAD Patient? No     Pain Assessment   Currently in Pain? No/denies   Multiple Pain Sites No         Goals Met:  Exercise tolerated well Personal goals reviewed No report of cardiac concerns or symptoms Strength training completed today  Goals Unmet:  Not Applicable  Comments: First full day of exercise!  Patient was oriented to gym and equipment including functions, settings, policies, and procedures.  Patient's individual exercise prescription and treatment plan were reviewed.  All starting workloads were established based on the results of the 6 minute walk test done at initial orientation visit.  The plan for exercise progression was also introduced and progression will be customized based on patient's performance and goals.    Dr. Emily Filbert is Medical Director for [facility].

## 2016-03-18 NOTE — ED Triage Notes (Signed)
Relapsing productive cough and SOB since July. Hx COPD, asthma. SOB worse today. On EMS arrival could not speak in full sentences; sats 92%. Given '125mg'$  Solumedrol, 12m of Albuterol, and 145mof Atrovent in route. Reports she feels improvement on arrival.

## 2016-03-18 NOTE — ED Provider Notes (Signed)
[facility] DEPT Provider Note   CSN: [id number] Arrival date & time: 03/18/16  2135     History   Chief Complaint Chief Complaint  Patient presents with  . Shortness of Breath    HPI Kayleanna Lorman Fales is a 75 y.o. female hx of CAD with recent MI, heart failure, CKD, COPD, Here presenting with shortness breath. She had a recent MI And is currently at cardiac rehabilitation. She is doing well at rehabilitation today but developed sudden onset of shortness of breath this evening. She states that she has a hard time catching her breath. She was noticed to have audible wheezing as per EMS and was noted to have oxygen 92%. She was given solumedrol 125 mg IV and duoneb.   The history is provided by the patient.    Past Medical History:  Diagnosis Date  . Arthritis   . CAD (coronary artery disease) 03/2009   s/p MI  . 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 CKD (chronic kidney disease) stage 3, GFR 30-59 ml/min   . Emphysema/COPD ([place])   . Ex-smoker quit 2008  . GERD (gastroesophageal reflux disease)   . Helicobacter pylori gastritis 10/2007   treated  . History of CVA (cerebrovascular accident) without residual deficits 01/2012, 06/2012   R hemorrhagic MCA 01/2012 with remote lacunar infarct L putamen and IC, rpt 06/2012 acute multifocal R MCA infarct with remote hemorrhagic strokes affecting L basal ganglia and periventricular white matter, full recovery  . HLD (hyperlipidemia)   . HTN (hypertension)   . Ischemic cardiomyopathy 02/27/2014  . Lung cancer ([place]) dx'd 07/2006   Lung CA, s/p resection, followed by Dr Earlie Server  . Macular degeneration   . Myocardial infarction [facility]) 03/2009   Acute myocardial infarction 2010 - treated with BMS of LCx. LVEF 50% with subsequent CHF  . Osteoporosis 09/2013   T -3.6 forearm 09/2013, T -4.5 forearm 04/2015    Patient Active Problem List   Diagnosis Date  Noted  . Lumbar pain with radiation down left leg 03/02/2016  . Underweight 10/07/2015  . Vertigo 04/22/2015  . Skin nodule 04/22/2015  . Medicare annual wellness visit, subsequent 04/08/2015  . Advanced care planning/counseling discussion 04/08/2015  . Vitamin D insufficiency 02/02/2015  . CKD (chronic kidney disease) stage 3, GFR 30-59 ml/min   . History of CVA (cerebrovascular accident) without residual deficits   . Prediabetes   . Ischemic cardiomyopathy 02/27/2014  . Osteoporosis 09/24/2013  . COPD (chronic obstructive pulmonary disease) ([place]) 10/26/2012  . Pulmonary nodule, left 10/26/2012  . TIA (transient ischemic attack) 07/08/2012  . Cancer of lower lobe of right lung ([place]) 04/21/2012  . Chronic combined systolic and diastolic heart failure ([place]) 06/17/2009  . HLD (hyperlipidemia) 04/24/2009  . CAD, NATIVE VESSEL 04/24/2009  . GERD 04/24/2009  . Macular degeneration (senile) of retina 04/23/2009  . Essential hypertension 04/23/2009  . Osteoarthritis 04/23/2009    Past Surgical History:  Procedure Laterality Date  . BREAST BIOPSY Left   . CARDIAC CATHETERIZATION N/A 02/24/2016   Procedure: Left Heart Cath and Coronary Angiography;  Surgeon: Belva Crome, MD;  Location: [facility];  Service: Cardiovascular;  Laterality: N/A;  . CATARACT EXTRACTION Bilateral   . LUNG REMOVAL, PARTIAL Right 2008  . PARTIAL HYSTERECTOMY  1986   irregular periods, ovaries remain    OB History    No data available  Home Medications    Prior to Admission medications   Medication Sig Start Date End Date Taking? Authorizing Provider  albuterol (PROAIR HFA) 108 (90 BASE) MCG/ACT inhaler Inhale 2 puffs into the lungs every 4 (four) hours as needed for wheezing or shortness of breath. 02/12/15   Tammy S Parrett, NP  aspirin EC 81 MG tablet Take 1 tablet (81 mg total) by mouth daily. 02/26/16   Arbutus Leas, NP  bisoprolol (ZEBETA) 5 MG tablet Take 0.5 tablets (2.5 mg total) by  mouth daily. 03/13/16   Brittainy Erie Noe, PA-C  Calcium Carbonate-Vitamin D (CALCIUM-VITAMIN D) 500-200 MG-UNIT per tablet Take 2 tablets by mouth daily.    Historical Provider, MD  Cholecalciferol (VITAMIN D) 2000 UNITS CAPS Take 1 capsule by mouth daily.    Historical Provider, MD  clopidogrel (PLAVIX) 75 MG tablet Take 1 tablet (75 mg total) by mouth daily. 02/26/16   Arbutus Leas, NP  denosumab (PROLIA) 60 MG/ML SOLN injection Inject 60 mg into the skin every 6 (six) months. Administer in upper arm, thigh, or abdomen 06/13/15   Ria Bush, MD  furosemide (LASIX) 40 MG tablet TAKE 1 TABLET BY MOUTH DAILY 05/30/15   Sherren Mocha, MD  isosorbide mononitrate (IMDUR) 30 MG 24 hr tablet TAKE 1 TABLET BY MOUTH DAILY 11/19/15   Sherren Mocha, MD  losartan (COZAAR) 50 MG tablet Take 0.5 tablets (25 mg total) by mouth daily. 03/16/16   Ria Bush, MD  LOTEMAX 0.5 % ophthalmic suspension Place 1 drop into both eyes daily.  12/27/13   Historical Provider, MD  nitroGLYCERIN (NITROSTAT) 0.4 MG SL tablet Place 1 tablet (0.4 mg total) under the tongue every 5 (five) minutes as needed for chest pain. 07/03/15   Liliane Shi, PA-C  pantoprazole (PROTONIX) 40 MG tablet Take 1 tablet (40 mg total) by mouth daily. 02/27/16   Sherren Mocha, MD  simvastatin (ZOCOR) 40 MG tablet TAKE ONE TABLET BY MOUTH AT BEDTIME 05/14/15   Sherren Mocha, MD  spironolactone (ALDACTONE) 25 MG tablet TAKE 1/2  TABLET BY MOUTH ONCE DAILY. Patient not taking: Reported on 03/16/2016 03/20/15   Sherren Mocha, MD  tiotropium (SPIRIVA HANDIHALER) 18 MCG inhalation capsule Place 1 capsule (18 mcg total) into inhaler and inhale daily. 03/16/16   Ria Bush, MD    Family History Family History  Problem Relation Age of Onset  . CAD Mother 75    MI  . Stroke Mother   . Hypertension Mother   . Heart attack Mother   . CAD Father 75  . Stroke Father   . Breast cancer Sister   . Diabetes Sister     Social History Social  History  Substance Use Topics  . Smoking status: Former Smoker    Packs/day: 2.00    Years: 40.00    Types: Cigarettes    Quit date: 07/27/2006  . Smokeless tobacco: Never Used  . Alcohol use No     Allergies   Actonel [risedronate sodium]; Amlodipine; Fosamax [alendronate sodium]; Lisinopril; Pravastatin; and Codeine   Review of Systems Review of Systems  Respiratory: Positive for shortness of breath.   All other systems reviewed and are negative.    Physical Exam Updated Vital Signs BP 107/69 (BP Location: Right Arm)   Pulse (!) 58   Temp 97.8 F (36.6 C)   Resp 20   SpO2 100%   Physical Exam  Constitutional: She is oriented to person, place, and time.  Tachypneic, mild audible wheezing  HENT:  Head: Normocephalic.  Eyes: Pupils are equal, round, and reactive to light.  Neck: Normal range of motion.  Cardiovascular: Normal rate, regular rhythm and normal heart sounds.   Pulmonary/Chest:  + poor air movements. Mild diffuse wheezing   Abdominal: Soft. Bowel sounds are normal.  Musculoskeletal: Normal range of motion.  Neurological: She is alert and oriented to person, place, and time.  Skin: Skin is warm.  Psychiatric: She has a normal mood and affect.  Nursing note and vitals reviewed.    ED Treatments / Results  Labs (all labs ordered are listed, but only abnormal results are displayed) Labs Reviewed  CBC WITH DIFFERENTIAL/PLATELET - Abnormal; Notable for the following:       Result Value   RBC 3.63 (*)    Hemoglobin 11.3 (*)    HCT 34.4 (*)    All other components within normal limits  COMPREHENSIVE METABOLIC PANEL - Abnormal; Notable for the following:    Glucose, Bld 118 (*)    Creatinine, Ser 1.02 (*)    Total Protein 6.4 (*)    Albumin 3.1 (*)    AST 47 (*)    GFR calc non Af Amer 52 (*)    All other components within normal limits  I-STAT CHEM 8, ED - Abnormal; Notable for the following:    Chloride 99 (*)    Glucose, Bld 116 (*)     Hemoglobin 11.6 (*)    HCT 34.0 (*)    All other components within normal limits  CULTURE, BLOOD (ROUTINE X 2)  CULTURE, BLOOD (ROUTINE X 2)  I-STAT TROPOININ, ED  I-STAT CG4 LACTIC ACID, ED    EKG  EKG Interpretation  Date/Time:  Wednesday March 18 2016 21:49:34 EDT Ventricular Rate:  81 PR Interval:    QRS Duration: 96 QT Interval:  466 QTC Calculation: 404 R Axis:   77 Text Interpretation:  Sinus bradycardia Multiple ventricular premature complexes Anteroseptal infarct, old Repol abnrm suggests ischemia, anterolateral No significant change since last tracing Confirmed by Eriyana Sweeten  MD, Keyri Salberg ([id number]) on 03/18/2016 10:41:39 PM       Radiology Dg Chest Port 1 View  Result Date: 03/18/2016 CLINICAL DATA:  75 y/o  F; shortness of breath.  History of COPD. EXAM: PORTABLE CHEST 1 VIEW COMPARISON:  Chest CT 03/11/2016.  Chest radiograph 03/11/2016. FINDINGS: Stable cardiac silhouette given differences in positioning technique. Aortic arch calcifications. Nodular opacities in the right infrahilar region and right basilar patchy opacity increased in comparison with prior radiographs. IMPRESSION: Right basilar opacity increased from prior radiographs may represent developing pneumonia or atelectasis given right lower lobe airway obstruction on prior CT. Electronically Signed   By: Kristine Garbe M.D.   On: 03/18/2016 22:46    Procedures Procedures (including critical care time)  Medications Ordered in ED Medications  magnesium sulfate IVPB 2 g 50 mL (2 g Intravenous New Bag/Given 03/18/16 2243)  vancomycin (VANCOCIN) IVPB 1000 mg/200 mL premix (not administered)  piperacillin-tazobactam (ZOSYN) IVPB 3.375 g (not administered)  albuterol (PROVENTIL,VENTOLIN) solution continuous neb (15 mg/hr Nebulization Given 03/18/16 2228)  ipratropium (ATROVENT) nebulizer solution 0.5 mg (0.5 mg Nebulization Given 03/18/16 2228)     Initial Impression / Assessment and Plan / ED Course  I have  reviewed the triage vital signs and the nursing notes.  Pertinent labs & imaging results that were available during my care of the patient were reviewed by me and considered in my medical decision making (see chart for details).  Clinical Course  Janiel Derhammer Ledesma is a 75 y.o. female hx of COPD, CAD with recent MI here with SOB. Likely COPD exacerbation, less likely ACS. Given that she is tachypneic and required oxygen, will give continuous nebs, get labs, CXR and likely admit. She received solumedrol 125 mg IV en route.   11:01 PM CXR showed possible pneumonia vs atelectasis. Was recently in the hospital. Will give vanc/zosyn, add lactate, cultures. Will admit.    Final Clinical Impressions(s) / ED Diagnoses   Final diagnoses:  None    New Prescriptions New Prescriptions   No medications on file     Drenda Freeze, MD 03/18/16 2301

## 2016-03-19 ENCOUNTER — Encounter (HOSPITAL_COMMUNITY): Payer: Self-pay | Admitting: Internal Medicine

## 2016-03-19 ENCOUNTER — Observation Stay (HOSPITAL_COMMUNITY): Payer: PPO

## 2016-03-19 ENCOUNTER — Encounter: Payer: Self-pay | Admitting: Dietician

## 2016-03-19 ENCOUNTER — Telehealth: Payer: Self-pay

## 2016-03-19 ENCOUNTER — Ambulatory Visit: Payer: PPO | Admitting: Family Medicine

## 2016-03-19 ENCOUNTER — Telehealth: Payer: Self-pay | Admitting: *Deleted

## 2016-03-19 DIAGNOSIS — E440 Moderate protein-calorie malnutrition: Secondary | ICD-10-CM | POA: Diagnosis not present

## 2016-03-19 DIAGNOSIS — Z7902 Long term (current) use of antithrombotics/antiplatelets: Secondary | ICD-10-CM | POA: Diagnosis not present

## 2016-03-19 DIAGNOSIS — N183 Chronic kidney disease, stage 3 (moderate): Secondary | ICD-10-CM | POA: Diagnosis not present

## 2016-03-19 DIAGNOSIS — J189 Pneumonia, unspecified organism: Secondary | ICD-10-CM

## 2016-03-19 DIAGNOSIS — I252 Old myocardial infarction: Secondary | ICD-10-CM | POA: Diagnosis not present

## 2016-03-19 DIAGNOSIS — I130 Hypertensive heart and chronic kidney disease with heart failure and stage 1 through stage 4 chronic kidney disease, or unspecified chronic kidney disease: Secondary | ICD-10-CM | POA: Diagnosis not present

## 2016-03-19 DIAGNOSIS — Z955 Presence of coronary angioplasty implant and graft: Secondary | ICD-10-CM | POA: Diagnosis not present

## 2016-03-19 DIAGNOSIS — I5042 Chronic combined systolic (congestive) and diastolic (congestive) heart failure: Secondary | ICD-10-CM | POA: Diagnosis not present

## 2016-03-19 DIAGNOSIS — J449 Chronic obstructive pulmonary disease, unspecified: Secondary | ICD-10-CM | POA: Diagnosis present

## 2016-03-19 DIAGNOSIS — H3530 Unspecified macular degeneration: Secondary | ICD-10-CM | POA: Diagnosis not present

## 2016-03-19 DIAGNOSIS — Z79899 Other long term (current) drug therapy: Secondary | ICD-10-CM | POA: Diagnosis not present

## 2016-03-19 DIAGNOSIS — Y95 Nosocomial condition: Secondary | ICD-10-CM | POA: Diagnosis not present

## 2016-03-19 DIAGNOSIS — Z85118 Personal history of other malignant neoplasm of bronchus and lung: Secondary | ICD-10-CM | POA: Diagnosis not present

## 2016-03-19 DIAGNOSIS — Z87891 Personal history of nicotine dependence: Secondary | ICD-10-CM | POA: Diagnosis not present

## 2016-03-19 DIAGNOSIS — K219 Gastro-esophageal reflux disease without esophagitis: Secondary | ICD-10-CM | POA: Diagnosis not present

## 2016-03-19 DIAGNOSIS — R918 Other nonspecific abnormal finding of lung field: Secondary | ICD-10-CM | POA: Diagnosis not present

## 2016-03-19 DIAGNOSIS — J9601 Acute respiratory failure with hypoxia: Secondary | ICD-10-CM | POA: Diagnosis not present

## 2016-03-19 DIAGNOSIS — I2510 Atherosclerotic heart disease of native coronary artery without angina pectoris: Secondary | ICD-10-CM | POA: Diagnosis not present

## 2016-03-19 DIAGNOSIS — Z8673 Personal history of transient ischemic attack (TIA), and cerebral infarction without residual deficits: Secondary | ICD-10-CM | POA: Diagnosis not present

## 2016-03-19 DIAGNOSIS — E785 Hyperlipidemia, unspecified: Secondary | ICD-10-CM | POA: Diagnosis not present

## 2016-03-19 DIAGNOSIS — I959 Hypotension, unspecified: Secondary | ICD-10-CM | POA: Diagnosis not present

## 2016-03-19 DIAGNOSIS — Z7982 Long term (current) use of aspirin: Secondary | ICD-10-CM | POA: Diagnosis not present

## 2016-03-19 DIAGNOSIS — J440 Chronic obstructive pulmonary disease with acute lower respiratory infection: Secondary | ICD-10-CM | POA: Diagnosis not present

## 2016-03-19 DIAGNOSIS — J441 Chronic obstructive pulmonary disease with (acute) exacerbation: Secondary | ICD-10-CM | POA: Diagnosis present

## 2016-03-19 DIAGNOSIS — I255 Ischemic cardiomyopathy: Secondary | ICD-10-CM | POA: Diagnosis not present

## 2016-03-19 DIAGNOSIS — R0602 Shortness of breath: Secondary | ICD-10-CM | POA: Diagnosis not present

## 2016-03-19 DIAGNOSIS — Z681 Body mass index (BMI) 19 or less, adult: Secondary | ICD-10-CM | POA: Diagnosis not present

## 2016-03-19 HISTORY — DX: Pneumonia, unspecified organism: J18.9

## 2016-03-19 LAB — TROPONIN I
Troponin I: <0.03 ng/mL (ref <0.03)
Troponin I: <0.03 ng/mL (ref <0.03)

## 2016-03-19 LAB — BASIC METABOLIC PANEL
ANION GAP: 11 (ref 5–15)
BUN: 13 mg/dL (ref 6–20)
CHLORIDE: 100 mmol/L — AB (ref 101–111)
CO2: 25 mmol/L (ref 22–32)
Calcium: 9.2 mg/dL (ref 8.9–10.3)
Creatinine, Ser: 0.99 mg/dL (ref 0.44–1.00)
GFR calc Af Amer: >60 mL/min (ref >60)
GFR, EST NON AFRICAN AMERICAN: 54 mL/min — AB (ref >60)
GLUCOSE: 168 mg/dL — AB (ref 65–99)
POTASSIUM: 3.6 mmol/L (ref 3.5–5.1)
Sodium: 136 mmol/L (ref 135–145)

## 2016-03-19 LAB — CBC
HCT: 33.1 % — ABNORMAL LOW (ref 36.0–46.0)
HEMATOCRIT: 34.9 % — AB (ref 36.0–46.0)
HEMOGLOBIN: 10.9 g/dL — AB (ref 12.0–15.0)
HEMOGLOBIN: 11.5 g/dL — AB (ref 12.0–15.0)
MCH: 31.1 pg (ref 26.0–34.0)
MCH: 31.3 pg (ref 26.0–34.0)
MCHC: 32.9 g/dL (ref 30.0–36.0)
MCHC: 33 g/dL (ref 30.0–36.0)
MCV: 94.6 fL (ref 78.0–100.0)
MCV: 94.8 fL (ref 78.0–100.0)
Platelets: 189 10*3/uL (ref 150–400)
Platelets: 217 10*3/uL (ref 150–400)
RBC: 3.5 MIL/uL — AB (ref 3.87–5.11)
RBC: 3.68 MIL/uL — ABNORMAL LOW (ref 3.87–5.11)
RDW: 13.6 % (ref 11.5–15.5)
RDW: 13.7 % (ref 11.5–15.5)
WBC: 5.7 10*3/uL (ref 4.0–10.5)
WBC: 4.7 10*3/uL (ref 4.0–10.5)

## 2016-03-19 LAB — CREATININE, SERUM
Creatinine, Ser: 0.98 mg/dL (ref 0.44–1.00)
GFR calc Af Amer: >60 mL/min (ref >60)
GFR calc non Af Amer: 55 mL/min — ABNORMAL LOW (ref >60)

## 2016-03-19 LAB — HIV ANTIBODY (ROUTINE TESTING W REFLEX): HIV SCREEN 4TH GENERATION: NONREACTIVE

## 2016-03-19 MED ORDER — FUROSEMIDE 40 MG PO TABS
40.0000 mg | ORAL_TABLET | Freq: Every day | ORAL | Status: DC
  Administered 2016-03-19 – 2016-03-21 (×3): 40 mg via ORAL
  Filled 2016-03-19 (×3): qty 1

## 2016-03-19 MED ORDER — CLOPIDOGREL BISULFATE 75 MG PO TABS
75.0000 mg | ORAL_TABLET | Freq: Every day | ORAL | Status: DC
  Administered 2016-03-19 – 2016-03-21 (×3): 75 mg via ORAL
  Filled 2016-03-19 (×3): qty 1

## 2016-03-19 MED ORDER — ASPIRIN EC 81 MG PO TBEC
81.0000 mg | DELAYED_RELEASE_TABLET | Freq: Every day | ORAL | Status: DC
  Administered 2016-03-19 – 2016-03-21 (×3): 81 mg via ORAL
  Filled 2016-03-19 (×3): qty 1

## 2016-03-19 MED ORDER — ONDANSETRON HCL 4 MG PO TABS
4.0000 mg | ORAL_TABLET | Freq: Four times a day (QID) | ORAL | Status: DC | PRN
Start: 2016-03-19 — End: 2016-03-21

## 2016-03-19 MED ORDER — ACETAMINOPHEN 325 MG PO TABS: 650.0000 mg | ORAL_TABLET | Freq: Four times a day (QID) | ORAL | Status: DC | PRN

## 2016-03-19 MED ORDER — LOSARTAN POTASSIUM 25 MG PO TABS
25.0000 mg | ORAL_TABLET | Freq: Every day | ORAL | Status: DC
  Administered 2016-03-19 – 2016-03-20 (×2): 25 mg via ORAL
  Filled 2016-03-19 (×3): qty 1

## 2016-03-19 MED ORDER — DEXTROSE 5 % IV SOLN
2.0000 g | INTRAVENOUS | Status: DC
  Administered 2016-03-19 – 2016-03-20 (×2): 2 g via INTRAVENOUS
  Filled 2016-03-19 (×3): qty 2

## 2016-03-19 MED ORDER — IPRATROPIUM-ALBUTEROL 0.5-2.5 (3) MG/3ML IN SOLN
3.0000 mL | RESPIRATORY_TRACT | Status: DC
  Administered 2016-03-19 (×4): 3 mL via RESPIRATORY_TRACT
  Filled 2016-03-19 (×4): qty 3

## 2016-03-19 MED ORDER — BUDESONIDE 0.25 MG/2ML IN SUSP
0.2500 mg | Freq: Two times a day (BID) | RESPIRATORY_TRACT | Status: DC
  Administered 2016-03-19 – 2016-03-21 (×5): 0.25 mg via RESPIRATORY_TRACT
  Filled 2016-03-19 (×5): qty 2

## 2016-03-19 MED ORDER — ENOXAPARIN SODIUM 40 MG/0.4ML ~~LOC~~ SOLN
40.0000 mg | Freq: Every day | SUBCUTANEOUS | Status: DC
  Administered 2016-03-19 – 2016-03-21 (×3): 40 mg via SUBCUTANEOUS
  Filled 2016-03-19 (×3): qty 0.4

## 2016-03-19 MED ORDER — ONDANSETRON HCL 4 MG/2ML IJ SOLN: 4.0000 mg | Freq: Four times a day (QID) | INTRAMUSCULAR | Status: DC | PRN

## 2016-03-19 MED ORDER — METHYLPREDNISOLONE SODIUM SUCC 40 MG IJ SOLR
40.0000 mg | Freq: Three times a day (TID) | INTRAMUSCULAR | Status: DC
  Administered 2016-03-19 – 2016-03-20 (×4): 40 mg via INTRAVENOUS
  Filled 2016-03-19 (×4): qty 1

## 2016-03-19 MED ORDER — ENSURE ENLIVE PO LIQD
237.0000 mL | Freq: Two times a day (BID) | ORAL | Status: DC
  Administered 2016-03-19 – 2016-03-21 (×5): 237 mL via ORAL

## 2016-03-19 MED ORDER — METHYLPREDNISOLONE SODIUM SUCC 40 MG IJ SOLR: 40.0000 mg | Freq: Every day | INTRAMUSCULAR | Status: DC

## 2016-03-19 MED ORDER — VITAMIN D 1000 UNITS PO TABS
2000.0000 [IU] | ORAL_TABLET | Freq: Every day | ORAL | Status: DC
  Administered 2016-03-19 – 2016-03-21 (×3): 2000 [IU] via ORAL
  Filled 2016-03-19 (×3): qty 2

## 2016-03-19 MED ORDER — LOTEPREDNOL ETABONATE 0.5 % OP SUSP
1.0000 [drp] | Freq: Every day | OPHTHALMIC | Status: DC
  Administered 2016-03-19 – 2016-03-21 (×3): 1 [drp] via OPHTHALMIC
  Filled 2016-03-19: qty 5

## 2016-03-19 MED ORDER — IPRATROPIUM-ALBUTEROL 0.5-2.5 (3) MG/3ML IN SOLN
3.0000 mL | Freq: Four times a day (QID) | RESPIRATORY_TRACT | Status: DC
  Administered 2016-03-20 – 2016-03-21 (×4): 3 mL via RESPIRATORY_TRACT
  Filled 2016-03-19 (×5): qty 3

## 2016-03-19 MED ORDER — SPIRONOLACTONE 25 MG PO TABS
12.5000 mg | ORAL_TABLET | Freq: Every day | ORAL | Status: DC
  Administered 2016-03-19 – 2016-03-21 (×3): 12.5 mg via ORAL
  Filled 2016-03-19 (×3): qty 1

## 2016-03-19 MED ORDER — SIMVASTATIN 40 MG PO TABS
40.0000 mg | ORAL_TABLET | Freq: Every day | ORAL | Status: DC
  Administered 2016-03-19 – 2016-03-20 (×2): 40 mg via ORAL
  Filled 2016-03-19 (×2): qty 1

## 2016-03-19 MED ORDER — SODIUM CHLORIDE 0.9 % IV SOLN: INTRAVENOUS | Status: DC

## 2016-03-19 MED ORDER — BISOPROLOL FUMARATE 5 MG PO TABS
2.5000 mg | ORAL_TABLET | Freq: Every day | ORAL | Status: DC
  Administered 2016-03-19 – 2016-03-21 (×3): 2.5 mg via ORAL
  Filled 2016-03-19 (×3): qty 1

## 2016-03-19 MED ORDER — VANCOMYCIN HCL 500 MG IV SOLR
500.0000 mg | Freq: Two times a day (BID) | INTRAVENOUS | Status: DC
  Administered 2016-03-19 – 2016-03-20 (×2): 500 mg via INTRAVENOUS
  Filled 2016-03-19 (×3): qty 500

## 2016-03-19 MED ORDER — DEXTROSE 5 % IV SOLN: 1.0000 g | Freq: Three times a day (TID) | INTRAVENOUS | Status: DC

## 2016-03-19 MED ORDER — ACETAMINOPHEN 650 MG RE SUPP: 650.0000 mg | Freq: Four times a day (QID) | RECTAL | Status: DC | PRN

## 2016-03-19 MED ORDER — ISOSORBIDE MONONITRATE ER 30 MG PO TB24
30.0000 mg | ORAL_TABLET | Freq: Every day | ORAL | Status: DC
  Administered 2016-03-19 – 2016-03-21 (×3): 30 mg via ORAL
  Filled 2016-03-19 (×3): qty 1

## 2016-03-19 MED ORDER — IPRATROPIUM-ALBUTEROL 0.5-2.5 (3) MG/3ML IN SOLN: 3.0000 mL | RESPIRATORY_TRACT | Status: DC | PRN

## 2016-03-19 MED ORDER — CEFEPIME HCL 1 G IJ SOLR
1.0000 g | INTRAMUSCULAR | Status: DC
  Filled 2016-03-19: qty 1

## 2016-03-19 MED ORDER — CALCIUM CARBONATE-VITAMIN D 500-200 MG-UNIT PO TABS
2.0000 | ORAL_TABLET | Freq: Every day | ORAL | Status: DC
Start: 2016-03-19 — End: 2016-03-21
  Administered 2016-03-19 – 2016-03-21 (×3): 2 via ORAL
  Filled 2016-03-19: qty 2
  Filled 2016-03-19 (×2): qty 1
  Filled 2016-03-19: qty 2
  Filled 2016-03-19: qty 1

## 2016-03-19 MED ORDER — CEFEPIME HCL 1 G IJ SOLR
1.0000 g | INTRAMUSCULAR | Status: AC
  Administered 2016-03-19: 1 g via INTRAVENOUS
  Filled 2016-03-19: qty 1

## 2016-03-19 MED ORDER — PANTOPRAZOLE SODIUM 40 MG PO TBEC
40.0000 mg | DELAYED_RELEASE_TABLET | Freq: Every day | ORAL | Status: DC
  Administered 2016-03-19 – 2016-03-21 (×3): 40 mg via ORAL
  Filled 2016-03-19 (×3): qty 1

## 2016-03-19 MED ORDER — NITROGLYCERIN 0.4 MG SL SUBL
0.4000 mg | SUBLINGUAL_TABLET | SUBLINGUAL | Status: DC | PRN
  Administered 2016-03-20: 0.4 mg via SUBLINGUAL
  Filled 2016-03-19: qty 1

## 2016-03-19 NOTE — Progress Notes (Signed)
Pharmacy Antibiotic Note  [name] is a 75 y.o. female admitted on 03/18/2016 with pneumonia.  Pharmacy has been consulted for Vancomycin and Cefepime dosing x 8 days.  Plan: Change Cefepime to 1gm IV q24h x 8 days Vancomycin 1gm now then '500mg'$  IV q12h x 8 days Will f/u micro data, renal function, and pt's clinical condition Vanc trough prn   Height: '5\' 7"'$  (170.2 cm) Weight: 114 lb 1.6 oz (51.8 kg) (scale b) IBW/kg (Calculated) : 61.6  Temp (24hrs), Avg:97.8 F (36.6 C), Min:97.8 F (36.6 C), Max:97.8 F (36.6 C)   Recent Labs Lab 03/18/16 2225 03/18/16 2228 03/18/16 2345  WBC 6.4  --   --   CREATININE 1.02* 0.90  --   LATICACIDVEN  --   --  1.50    Estimated Creatinine Clearance: 44.2 mL/min (by C-G formula based on SCr of 0.9 mg/dL).    Allergies  Allergen Reactions  . Actonel [Risedronate Sodium] Other (See Comments)    Headache  . Amlodipine Other (See Comments)    Pedal edema  . Fosamax [Alendronate Sodium] Other (See Comments)    Unable to tolerate  . Lisinopril Cough  . Pravastatin Other (See Comments)    Constipation.  . Codeine Rash    Antimicrobials this admission: 8/24 Cefepime >>  8/24 Vanc >>   Dose adjustments this admission: n/a  Microbiology results: 8/24 BCx x2:   Sputum:    Thank you for allowing pharmacy to be a part of this patient's care.  Sherlon Handing, PharmD, BCPS Clinical pharmacist, pager [phone] 03/19/2016 1:28 AM

## 2016-03-19 NOTE — Progress Notes (Signed)
Pharmacy Antibiotic Note  [name] is a 75 y.o. female admitted on 03/18/2016 with pneumonia.  Pharmacy has been consulted for Vancomycin and Cefepime.  Day #1 of abx for HCAP. Afebrile, WBC wnl. SCr stable, CrCl ~59m/min.  Plan: Change Cefepime to 2g IV Q24 Continue vancomycin '500mg'$  IV Q12 Monitor clinical picture, renal function, VT prn F/U C&S, abx deescalation / LOT  Consider stopping vancomycin today due to negative MRSA PCR?   Height: '5\' 7"'$  (170.2 cm) Weight: 114 lb 1.6 oz (51.8 kg) (scale b) IBW/kg (Calculated) : 61.6  Temp (24hrs), Avg:97.8 F (36.6 C), Min:97.7 F (36.5 C), Max:97.8 F (36.6 C)   Recent Labs Lab 03/18/16 2225 03/18/16 2228 03/18/16 2345 03/19/16 0226  WBC 6.4  --   --  5.7  CREATININE 1.02* 0.90  --  0.98  LATICACIDVEN  --   --  1.50  --     Estimated Creatinine Clearance: 40.6 mL/min (by C-G formula based on SCr of 0.98 mg/dL).    Allergies  Allergen Reactions  . Actonel [Risedronate Sodium] Other (See Comments)    Headache  . Amlodipine Other (See Comments)    Pedal edema  . Fosamax [Alendronate Sodium] Other (See Comments)    Unable to tolerate  . Lisinopril Cough  . Pravastatin Other (See Comments)    Constipation.  . Codeine Rash    Antimicrobials this admission: 8/24 Cefepime >>  8/24 Vanc >>   Dose adjustments this admission: n/a  Microbiology results: 8/24 BCx x2:  MRSA PCR negative  Thank you for allowing pharmacy to be a part of this patient's care.  NElenor Quinones PharmD, BCPS Clinical Pharmacist Pager [phone] 46268/24/2017 8:41 AM

## 2016-03-19 NOTE — Progress Notes (Signed)
Initial Nutrition Assessment  DOCUMENTATION CODES:   Non-severe (moderate) malnutrition in context of chronic illness, Underweight  INTERVENTION:  Provide Ensure Enlive po BID, each supplement provides 350 kcal and 20 grams of protein Encourage PO intake with protein-rich foods   NUTRITION DIAGNOSIS:   Malnutrition related to chronic illness, poor appetite as evidenced by severe depletion of muscle mass, moderate depletion of body fat.   GOAL:   Patient will meet greater than or equal to 90% of their needs   MONITOR:   PO intake, Supplement acceptance, Labs, Weight trends, I & O's, Skin  REASON FOR ASSESSMENT:   Malnutrition Screening Tool    ASSESSMENT:   75 y.o. female with CAD status post stenting and recent cardiac cath in 02/24/2016, ischemic cardiomyopathy last EF measured was 35%, COPD was recently started on Spiriva 3 days ago started developing worsening shortness of breath and wheezing last few days. Patient states her shortness of breath actually started after last admission but has worsened recently.   Pt reports having a poor appetite and eating less than usual for the past several months. She states that she usually weighs 120 lbs and has noticed her legs are thinner. She reports eating small meals 3 times per day; one meal may be a biscuit, or a bowl or cereal, or half a sandwich. She started drinking Ensure once daily 2 days ago. Weight history shows that patient has lost from 125 lbs to 111 lbs in the past year- 11% weight loss. She has severe muscle wasting and moderate fat wasting per nutrition-focused physical exam.  RD encouraged increasing calorie and protein intake via snacks and Ensure supplements 2 times per day.   Labs: low hemoglobin  Diet Order:  Diet Heart Room service appropriate? Yes; Fluid consistency: Thin; Fluid restriction: 1200 mL Fluid  Skin:  Reviewed, no issues  Last BM:  8/23  Height:   Ht Readings from Last 1 Encounters:   03/19/16 '5\' 7"'$  (1.702 m)    Weight:   Wt Readings from Last 1 Encounters:  03/19/16 114 lb 1.6 oz (51.8 kg)    Ideal Body Weight:  61.36 kg  BMI:  Body mass index is 17.87 kg/m.  Estimated Nutritional Needs:   Kcal:  1550-1750  Protein:  75-85 grams  Fluid:  1.5-1.7 L/day  EDUCATION NEEDS:   No education needs identified at this time  Scarlette Ar RD, LDN, CSP Inpatient Clinical Dietitian Pager: [phone] After Hours Pager: [phone]

## 2016-03-19 NOTE — Evaluation (Signed)
Physical Therapy Evaluation Patient Details Name: [name] MRN: [id number] DOB: [birth year] Today's Date: 03/19/2016   History of Present Illness  Pt is a pleasant 75 y/o female admitted for SOB and COPD exacerbation. Pt recently presenting to ED for SOB on 03/18/16 and was d/c home. PMH including but not limited to COPD, CAD, MI in 2010, CHF, CKD, CVA in 2013, HTN, lung cancer and macular degeneration.   Clinical Impression  Pt presented sitting OOB in recliner when PT entered room. Pt was awake and eager to participate in therapy session. Prior to admission, pt reported being independent with ADLs and ambulation. Pt limited by fatigue and SOB during session. Pt would continue to benefit from skilled physical therapy services at this time while admitted to address her below listed limitations in order to improve her overall safety and independence with functional mobility.      Follow Up Recommendations No PT follow up    Equipment Recommendations  None recommended by PT    Recommendations for Other Services       Precautions / Restrictions Precautions Precautions: Fall Restrictions Weight Bearing Restrictions: No      Mobility  Bed Mobility               General bed mobility comments: pt OOB sitting up in recliner when PT entered room  Transfers Overall transfer level: Needs assistance Equipment used: None Transfers: Sit to/from Stand Sit to Stand: Supervision            Ambulation/Gait Ambulation/Gait assistance: Supervision Ambulation Distance (Feet): 150 Feet Assistive device: None Gait Pattern/deviations: Step-through pattern;Decreased stride length Gait velocity: decreased      Stairs            Wheelchair Mobility    Modified Rankin (Stroke Patients Only)       Balance Overall balance assessment: Needs assistance Sitting-balance support: Feet supported;No upper extremity supported Sitting balance-Leahy Scale: Good     Standing  balance support: During functional activity;No upper extremity supported Standing balance-Leahy Scale: Fair                               Pertinent Vitals/Pain Pain Assessment: No/denies pain    Home Living Family/patient expects to be discharged to:: Private residence Living Arrangements: Alone Available Help at Discharge: Family;Available PRN/intermittently Type of Home: House Home Access: Level entry     Home Layout: One level Home Equipment: None      Prior Function Level of Independence: Independent               Hand Dominance        Extremity/Trunk Assessment   Upper Extremity Assessment: Overall WFL for tasks assessed           Lower Extremity Assessment: LLE deficits/detail;RLE deficits/detail RLE Deficits / Details: MMT revealed 4/5 for hip flex, hip abd/add, and knee flex; 5/5 for knee ext and ankle DF. Sensation grossly intact.  LLE Deficits / Details: MMT revealed 4/5 for hip flex, hip abd/add, and knee flex; 5/5 for knee ext and ankle DF. Sensation grossly intact.     Communication   Communication: No difficulties  Cognition Arousal/Alertness: Awake/alert Behavior During Therapy: WFL for tasks assessed/performed Overall Cognitive Status: Within Functional Limits for tasks assessed                      General Comments      Exercises  Assessment/Plan    PT Assessment Patient needs continued PT services  PT Diagnosis Generalized weakness   PT Problem List Decreased activity tolerance;Decreased balance;Decreased mobility;Decreased coordination  PT Treatment Interventions Gait training;Functional mobility training;Therapeutic activities;Therapeutic exercise;Balance training;Neuromuscular re-education;Patient/family education   PT Goals (Current goals can be found in the Care Plan section) Acute Rehab PT Goals Patient Stated Goal: to feel better PT Goal Formulation: With patient Time For Goal Achievement:  03/26/16 Potential to Achieve Goals: Good    Frequency Min 3X/week   Barriers to discharge        Co-evaluation               End of Session Equipment Utilized During Treatment: Gait belt Activity Tolerance: Patient tolerated treatment well Patient left: in chair;with call bell/phone within reach Nurse Communication: Mobility status    Functional Assessment Tool Used: clinical judgement Functional Limitation: Mobility: Walking and moving around Mobility: Walking and Moving Around Current Status [phone]): At least 1 percent but less than 20 percent impaired, limited or restricted Mobility: Walking and Moving Around Goal Status [phone]): 0 percent impaired, limited or restricted    Time: 7207-2182 PT Time Calculation (min) (ACUTE ONLY): 15 min   Charges:   PT Evaluation $PT Eval Moderate Complexity: 1 Procedure     PT G Codes:   PT G-Codes **NOT FOR INPATIENT CLASS** Functional Assessment Tool Used: clinical judgement Functional Limitation: Mobility: Walking and moving around Mobility: Walking and Moving Around Current Status ([id number]): At least 1 percent but less than 20 percent impaired, limited or restricted Mobility: Walking and Moving Around Goal Status [phone]): 0 percent impaired, limited or restricted    [employer] 03/19/2016, 5:30 PM

## 2016-03-19 NOTE — H&P (Signed)
History and Physical    [name] IOE:[id number] DOB: 1940/08/22 DOA: 03/18/2016  PCP: Ria Bush, MD  Patient coming from: Home.  Chief Complaint: Shortness of breath.  HPI: [name] is a 75 y.o. female with CAD status post stenting and recent cardiac cath in 02/24/2016, ischemic cardiomyopathy last EF measured was 35%, COPD was recently started on Spiriva 3 days ago started developing worsening shortness of breath and wheezing last few days. Patient states her shortness of breath actually started after last admission but has worsened recently. After taking the Spiriva which was recently started patient's wheezing worsened acutely with productive cough and had been coughing at least for one hour continuously. In the ER patient was found to be wheezing and chest x-ray shows possible infiltrates. After nebulizer treatment patient states her breathing has improved and at this time on my exam patient is not in distress. Patient was given antibiotics for possible healthcare associated pneumonia and admitted for further management COPD exacerbation and possible pneumonia. Denies any chest pain fever chills but has been having productive cough.   ED Course: Nebulizer and antibiotics and steroids were given.  Review of Systems: As per HPI, rest all negative.   Past Medical History:  Diagnosis Date  . Arthritis   . CAD (coronary artery disease) 03/2009   s/p MI  . Chronic combined systolic and diastolic heart failure ([place]) 06/17/2009   Qualifier: Diagnosis of  By: Burt Knack, MD, [place]burn Pert   . CKD (chronic kidney disease) stage 3, GFR 30-59 ml/min   . Emphysema/COPD ([place])   . Ex-smoker quit 2008  . GERD (gastroesophageal reflux disease)   . Helicobacter pylori gastritis 10/2007   treated  . History of CVA (cerebrovascular accident) without residual deficits 01/2012, 06/2012   R hemorrhagic MCA 01/2012 with remote lacunar infarct L putamen and IC, rpt 06/2012 acute multifocal R  MCA infarct with remote hemorrhagic strokes affecting L basal ganglia and periventricular white matter, full recovery  . HLD (hyperlipidemia)   . HTN (hypertension)   . Ischemic cardiomyopathy 02/27/2014  . Lung cancer ([place]) dx'd 07/2006   Lung CA, s/p resection, followed by Dr Earlie Server  . Macular degeneration   . Myocardial infarction [facility]) 03/2009   Acute myocardial infarction 2010 - treated with BMS of LCx. LVEF 50% with subsequent CHF  . Osteoporosis 09/2013   T -3.6 forearm 09/2013, T -4.5 forearm 04/2015    Past Surgical History:  Procedure Laterality Date  . BREAST BIOPSY Left   . CARDIAC CATHETERIZATION N/A 02/24/2016   Procedure: Left Heart Cath and Coronary Angiography;  Surgeon: Belva Crome, MD;  Location: [facility];  Service: Cardiovascular;  Laterality: N/A;  . CATARACT EXTRACTION Bilateral   . LUNG REMOVAL, PARTIAL Right 2008  . PARTIAL HYSTERECTOMY  1986   irregular periods, ovaries remain     reports that she quit smoking about 9 years ago. Her smoking use included Cigarettes. She has a 80.00 pack-year smoking history. She has never used smokeless tobacco. She reports that she does not drink alcohol or use drugs.  Allergies  Allergen Reactions  . Actonel [Risedronate Sodium] Other (See Comments)    Headache  . Amlodipine Other (See Comments)    Pedal edema  . Fosamax [Alendronate Sodium] Other (See Comments)    Unable to tolerate  . Lisinopril Cough  . Pravastatin Other (See Comments)    Constipation.  . Codeine Rash    Family History  Problem Relation Age of Onset  .  CAD Mother 68    MI  . Stroke Mother   . Hypertension Mother   . Heart attack Mother   . CAD Father 60  . Stroke Father   . Breast cancer Sister   . Diabetes Sister     Prior to Admission medications   Medication Sig Start Date End Date Taking? Authorizing Provider  albuterol (PROAIR HFA) 108 (90 BASE) MCG/ACT inhaler Inhale 2 puffs into the lungs every 4 (four) hours as needed  for wheezing or shortness of breath. 02/12/15  Yes Tammy S Parrett, NP  aspirin EC 81 MG tablet Take 1 tablet (81 mg total) by mouth daily. 02/26/16  Yes Arbutus Leas, NP  bisoprolol (ZEBETA) 5 MG tablet Take 0.5 tablets (2.5 mg total) by mouth daily. 03/13/16  Yes Brittainy Erie Noe, PA-C  Calcium Carbonate-Vitamin D (CALCIUM-VITAMIN D) 500-200 MG-UNIT per tablet Take 2 tablets by mouth daily.   Yes Historical Provider, MD  Cholecalciferol (VITAMIN D) 2000 UNITS CAPS Take 1 capsule by mouth daily.   Yes Historical Provider, MD  clopidogrel (PLAVIX) 75 MG tablet Take 1 tablet (75 mg total) by mouth daily. 02/26/16  Yes Arbutus Leas, NP  denosumab (PROLIA) 60 MG/ML SOLN injection Inject 60 mg into the skin every 6 (six) months. Administer in upper arm, thigh, or abdomen 06/13/15  Yes Ria Bush, MD  furosemide (LASIX) 40 MG tablet TAKE 1 TABLET BY MOUTH DAILY 05/30/15  Yes Sherren Mocha, MD  isosorbide mononitrate (IMDUR) 30 MG 24 hr tablet TAKE 1 TABLET BY MOUTH DAILY 11/19/15  Yes Sherren Mocha, MD  losartan (COZAAR) 50 MG tablet Take 0.5 tablets (25 mg total) by mouth daily. 03/16/16  Yes Ria Bush, MD  LOTEMAX 0.5 % ophthalmic suspension Place 1 drop into both eyes daily.  12/27/13  Yes Historical Provider, MD  nitroGLYCERIN (NITROSTAT) 0.4 MG SL tablet Place 1 tablet (0.4 mg total) under the tongue every 5 (five) minutes as needed for chest pain. 07/03/15  Yes Scott T Kathlen Mody, PA-C  pantoprazole (PROTONIX) 40 MG tablet Take 1 tablet (40 mg total) by mouth daily. 02/27/16  Yes Sherren Mocha, MD  simvastatin (ZOCOR) 40 MG tablet TAKE ONE TABLET BY MOUTH AT BEDTIME 05/14/15  Yes Sherren Mocha, MD  spironolactone (ALDACTONE) 25 MG tablet TAKE 1/2  TABLET BY MOUTH ONCE DAILY. 03/20/15  Yes Sherren Mocha, MD  tiotropium (SPIRIVA HANDIHALER) 18 MCG inhalation capsule Place 1 capsule (18 mcg total) into inhaler and inhale daily. 03/16/16  Yes Ria Bush, MD    Physical Exam: Vitals:    03/18/16 2231 03/18/16 2245 03/18/16 2330 03/19/16 0101  BP:  107/69 (!) 101/52 (!) 107/55  Pulse:  (!) 58 69 78  Resp:  '20 22 20  '$ Temp:    97.8 F (36.6 C)  TempSrc:    Oral  SpO2: 98% 100% 100% 97%  Weight:    114 lb 1.6 oz (51.8 kg)  Height:    '5\' 7"'$  (1.702 m)      Constitutional: Not in distress. Vitals:   03/18/16 2231 03/18/16 2245 03/18/16 2330 03/19/16 0101  BP:  107/69 (!) 101/52 (!) 107/55  Pulse:  (!) 58 69 78  Resp:  '20 22 20  '$ Temp:    97.8 F (36.6 C)  TempSrc:    Oral  SpO2: 98% 100% 100% 97%  Weight:    114 lb 1.6 oz (51.8 kg)  Height:    '5\' 7"'$  (1.702 m)   Eyes: Anicteric no pallor. ENMT: No  discharge from the ears eyes nose or mouth. Neck: No mass felt. No JVD appreciated. Respiratory: Mild expiratory wheeze heard no crepitations. Cardiovascular: S1-S2 heard. Abdomen: Soft nontender bowel sounds present. No guarding or rigidity. Musculoskeletal: No edema. Skin: No rash. Neurologic: Alert awake oriented to time place and person. Moves all extremities. Psychiatric: Appears normal.   Labs on Admission: I have personally reviewed following labs and imaging studies  CBC:  Recent Labs Lab 03/18/16 2225 03/18/16 2228  WBC 6.4  --   NEUTROABS 3.3  --   HGB 11.3* 11.6*  HCT 34.4* 34.0*  MCV 94.8  --   PLT 197  --    Basic Metabolic Panel:  Recent Labs Lab 03/18/16 2225 03/18/16 2228  NA 138 138  K 3.6 3.6  CL 103 99*  CO2 29  --   GLUCOSE 118* 116*  BUN 14 16  CREATININE 1.02* 0.90  CALCIUM 9.2  --    GFR: Estimated Creatinine Clearance: 44.2 mL/min (by C-G formula based on SCr of 0.9 mg/dL). Liver Function Tests:  Recent Labs Lab 03/18/16 2225  AST 47*  ALT 37  ALKPHOS 77  BILITOT 0.4  PROT 6.4*  ALBUMIN 3.1*   No results for input(s): LIPASE, AMYLASE in the last 168 hours. No results for input(s): AMMONIA in the last 168 hours. Coagulation Profile: No results for input(s): INR, PROTIME in the last 168 hours. Cardiac  Enzymes: No results for input(s): CKTOTAL, CKMB, CKMBINDEX, TROPONINI in the last 168 hours. BNP (last 3 results) No results for input(s): PROBNP in the last 8760 hours. HbA1C: No results for input(s): HGBA1C in the last 72 hours. CBG: No results for input(s): GLUCAP in the last 168 hours. Lipid Profile: No results for input(s): CHOL, HDL, LDLCALC, TRIG, CHOLHDL, LDLDIRECT in the last 72 hours. Thyroid Function Tests: No results for input(s): TSH, T4TOTAL, FREET4, T3FREE, THYROIDAB in the last 72 hours. Anemia Panel: No results for input(s): VITAMINB12, FOLATE, FERRITIN, TIBC, IRON, RETICCTPCT in the last 72 hours. Urine analysis:    Component Value Date/Time   COLORURINE YELLOW 05/05/2009 0353   APPEARANCEUR CLEAR 05/05/2009 0353   LABSPEC 1.023 05/05/2009 0353   PHURINE 5.0 05/05/2009 0353   GLUCOSEU NEGATIVE 05/05/2009 0353   HGBUR NEGATIVE 05/05/2009 0353   BILIRUBINUR NEGATIVE 05/05/2009 0353   Jud 05/05/2009 0353   PROTEINUR NEGATIVE 05/05/2009 0353   UROBILINOGEN 0.2 05/05/2009 0353   NITRITE NEGATIVE 05/05/2009 0353   LEUKOCYTESUR  05/05/2009 0353    NEGATIVE MICROSCOPIC NOT DONE ON URINES WITH NEGATIVE PROTEIN, BLOOD, LEUKOCYTES, NITRITE, OR GLUCOSE <1000 mg/dL.   Sepsis Labs: '@LABRCNTIP'$ (procalcitonin:4,lacticidven:4) )No results found for this or any previous visit (from the past 240 hour(s)).   Radiological Exams on Admission: Dg Chest Port 1 View  Result Date: 03/18/2016 CLINICAL DATA:  75 y/o  F; shortness of breath.  History of COPD. EXAM: PORTABLE CHEST 1 VIEW COMPARISON:  Chest CT 03/11/2016.  Chest radiograph 03/11/2016. FINDINGS: Stable cardiac silhouette given differences in positioning technique. Aortic arch calcifications. Nodular opacities in the right infrahilar region and right basilar patchy opacity increased in comparison with prior radiographs. IMPRESSION: Right basilar opacity increased from prior radiographs may represent developing  pneumonia or atelectasis given right lower lobe airway obstruction on prior CT. Electronically Signed   By: Kristine Garbe M.D.   On: 03/18/2016 22:46    EKG: Independently reviewed. Sinus bradycardia with PVCs.  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 Acute respiratory failure with hypoxia probably combination of COPD exacerbation and pneumonia - patient has been placed on antibiotics for healthcare associated pneumonia and on IV Solu-Medrol nebulizer and Pulmicort for COPD exacerbation. Since patient has history of lung cancer will get CT chest. 2. CAD status post stenting with recent cardiac cath showing distal LAD not amenable to PCI - patient was placed on Plavix and aspirin which will be continued along with beta blockers and statins. Denies any chest pain at this time. 3. Ischemic cardiomyopathy last EF measured was 35% during cardiac cath - appears euvolemic. Continue Lasix spironolactone and Imdur. Patient is also on Cozaar. 4. Hyperlipidemia on statins. 5. Chronic kidney disease stage III - creatinine appears to be at baseline. 6. History of lung cancer status post resection - see #1.   DVT prophylaxis: Lovenox. Code Status: Full code.  Family Communication: Discussed with patient.  Disposition Plan: Home.  Consults called: None.  Admission status: Observation. Telemetry.    Rise Patience MD [facility] Pager [phone].  If 7PM-7AM, please contact night-coverage [email or web address] Password [email or web address]  03/19/2016, 1:28 AM

## 2016-03-19 NOTE — Telephone Encounter (Signed)
PLEASE NOTE: All timestamps contained within this report are represented as [place] Standard Time. CONFIDENTIALTY NOTICE: This fax transmission is intended only for the addressee. It contains information that is legally privileged, confidential or otherwise protected from use or disclosure. If you are not the intended recipient, you are strictly prohibited from reviewing, disclosing, copying using or disseminating any of this information or taking any action in reliance on or regarding this information. If you have received this fax in error, please notify us immediately by telephone so that we can arrange for its return to [place]. Phone: [phone], Toll-Free: [phone], Fax: [phone] Page: 1 of 2 Call Id: [id number] [place] Patient Name: [name] Gender: Female DOB: [birth year] Age: 75 Y 3 M 8 D Return Phone Number: [phone] (Primary), [phone] (Secondary), [phone] (Alternate) Address: City/State/Zip: [place] Client [place] - Client Client Site [name] Physician Ria Bush - MD Contact Type Call Who Is Calling Patient / Member / Family / Caregiver Call Type Triage / Clinical Caller Name Linard Millers Return Phone Number [phone] (Alternate) Chief Complaint BREATHING - shortness of breath or sounds breathless Reason for Call Symptomatic / Request for [employer] states that her aunt took some medication and now she cannot breathe. Appointment Disposition EMR Appointment Not Necessary Info pasted into Epic No Translation No No Triage Reason Patient declined Nurse Assessment Nurse: Lillia Corporal, RN, Lenell Antu Date/Time (Eastern Time): 03/18/2016 8:28:08 PM Confirm and document reason for call. If symptomatic, describe symptoms. You must click the next button to save  text entered. ---Caller states that her aunt took some medication and now she cannot breathe. States patient was having difficulty breathing and took Spiriva to help with symptoms. States since then breathing has become more labored. States while waiting to speak with nurse family decided to call EMS, and help is on the way. Has the patient traveled out of the country within the last 30 days? ---Not Applicable Does the patient have any new or worsening symptoms? ---Yes Will a triage be completed? ---No Select reason for no triage. ---Patient declined Please document clinical information provided and list any resource used. ---911 in transit to patient's residence. Caller declines to proceed with triage, states patient can hardly breath. Guidelines Guideline Title Affirmed Question Affirmed Notes Nurse Date/Time (Eastern Time) Disp. Time Eilene Ghazi Time) Disposition Final User 03/18/2016 8:21:47 PM Send to Urgent Thana Ates 03/18/2016 8:30:26 PM Clinical Call Yes Lillia Corporal, RN, Lenell Antu PLEASE NOTE: All timestamps contained within this report are represented as [place] Standard Time. CONFIDENTIALTY NOTICE: This fax transmission is intended only for the addressee. It contains information that is legally privileged, confidential or otherwise protected from use or disclosure. If you are not the intended recipient, you are strictly prohibited from reviewing, disclosing, copying using or disseminating any of this information or taking any action in reliance on or regarding this information. If you have received this fax in error, please notify us immediately by telephone so that we can arrange for its return to [place]. Phone: [phone], Toll-Free: [phone], Fax: [phone] Page: 2 of 2 Call Id: [id number]

## 2016-03-19 NOTE — Progress Notes (Signed)
PROGRESS NOTE                                                                                                                                                                                                             Patient Demographics:    [name], is a 75 y.o. female, DOB - 1941/04/03, OMB:[id number]  Admit date - 03/18/2016   Admitting Physician Rise Patience, MD  Outpatient Primary MD for the patient is Ria Bush, MD  LOS - 0  Chief Complaint  Patient presents with  . Shortness of Breath       Brief Narrative   Patient admitted earlier today by my colleague, labs, imaging, chart was reviewed.   Subjective:    [name] today has, No headache, No chest pain, No abdominal pain - No Nausea, No new weakness tingling or numbness,reports cough, productive with yellow phlegm.   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   HACP - patient has been placed on antibiotics for healthcare associated pneumonia   COPD exacerbation - on IV Solu-Medrol nebulizer and Pulmicort for COPD exacerbation.   CAD status post stenting with recent cardiac cath showing distal LAD not amenable to PCI - patient was placed on Plavix and aspirin which will be continued along with beta blockers and statins. Denies any chest pain at this time.  Ischemic cardiomyopathy last EF measured was 35% during cardiac cath  - appears euvolemic. Continue Lasix spironolactone and  Imdur. Patient is also on Cozaar.  Hyperlipidemia  - on statins.  Chronic kidney disease stage III  - creatinine appears to be at baseline  History of lung cancer  - status post resection - follow on CT chest  Code Status : Full  Family Communication  : D/W patient  Disposition Plan  : Home when stable  Consults  :  None  Procedures  : None  DVT Prophylaxis  :  Lovenox   Lab Results  Component Value Date   PLT 217 03/19/2016    Antibiotics  :   Anti-infectives    Start     Dose/Rate Route Frequency Ordered Stop   03/19/16 2200  ceFEPIme (MAXIPIME) 1 g in dextrose 5 % 50 mL IVPB  Status:  Discontinued     1 g 100 mL/hr over 30 Minutes Intravenous Every 24 hours 03/19/16 0129 03/19/16 0839   03/19/16 2000  ceFEPIme (MAXIPIME) 2 g in dextrose 5 % 50 mL IVPB     2 g 100 mL/hr over 30 Minutes Intravenous Every 24 hours 03/19/16 0839 03/27/16 1959   03/19/16 1600  vancomycin (VANCOCIN) 500 mg in sodium chloride 0.9 % 100 mL IVPB     500 mg 100 mL/hr over 60 Minutes Intravenous Every 12 hours 03/19/16 0134 03/27/16 1559   03/19/16 0145  ceFEPIme (MAXIPIME) 1 g in dextrose 5 % 50 mL IVPB     1 g 100 mL/hr over 30 Minutes Intravenous NOW 03/19/16 0129 03/19/16 0216   03/19/16 0130  ceFEPIme (MAXIPIME) 1 g in dextrose 5 % 50 mL IVPB  Status:  Discontinued     1 g 100 mL/hr over 30 Minutes Intravenous Every 8 hours 03/19/16 0127 03/19/16 0129   03/18/16 2315  vancomycin (VANCOCIN) IVPB 1000 mg/200 mL premix     1,000 mg 200 mL/hr over 60 Minutes Intravenous  Once 03/18/16 2301 03/19/16 0136   03/18/16 2315  piperacillin-tazobactam (ZOSYN) IVPB 3.375 g     3.375 g 100 mL/hr over 30 Minutes Intravenous  Once 03/18/16 2301 03/19/16 0106        Objective:   Vitals:   03/19/16 0101 03/19/16 0632 03/19/16 0816 03/19/16 1033  BP: (!) 107/55 (!) 90/50  (!) 111/55  Pulse: 78 69  65  Resp: 20 20    Temp: 97.8 F (36.6 C) 97.7 F (36.5 C)    TempSrc: Oral Oral    SpO2:  97% 94% 94% 97%  Weight: 51.8 kg (114 lb 1.6 oz)     Height: '5\' 7"'$  (1.702 m)       Wt Readings from Last 3 Encounters:  03/19/16 51.8 kg (114 lb 1.6 oz)  03/16/16 52.3 kg (115 lb 4 oz)  03/12/16 51.7 kg (114 lb)     Intake/Output Summary (Last 24 hours) at 03/19/16 1105 Last data filed at 03/19/16 1000  Gross per 24 hour  Intake              480 ml  Output              300 ml  Net              180 ml     Physical Exam  Awake Alert, Oriented X 3,  [place].AT,PERRAL Supple Neck,No JVD,  Symmetrical Chest wall movement, Good air movement bilaterally, No wheezing RRR,No Gallops,Rubs or new Murmurs, No Parasternal Heave +ve B.Sounds, Abd Soft, No tenderness,  No Cyanosis, Clubbing or edema, No new Rash or bruise      Data Review:    CBC  Recent Labs Lab 03/18/16 2225 03/18/16 2228 03/19/16 0226 03/19/16 0808  WBC 6.4  --  5.7 4.7  HGB 11.3* 11.6* 10.9* 11.5*  HCT 34.4* 34.0* 33.1* 34.9*  PLT 197  --  189 217  MCV 94.8  --  94.6 94.8  MCH 31.1  --  31.1 31.3  MCHC 32.8  --  32.9 33.0  RDW 13.6  --  13.6 13.7  LYMPHSABS 2.0  --   --   --   MONOABS 0.5  --   --   --   EOSABS 0.6  --   --   --   BASOSABS 0.0  --   --   --     Chemistries   Recent Labs Lab 03/18/16 2225 03/18/16 2228 03/19/16 0226 03/19/16 0808  NA 138 138  --  136  K 3.6 3.6  --  3.6  CL 103 99*  --  100*  CO2 29  --   --  25  GLUCOSE 118* 116*  --  168*  BUN 14 16  --  13  CREATININE 1.02* 0.90 0.98 0.99  CALCIUM 9.2  --   --  9.2  AST 47*  --   --   --   ALT 37  --   --   --   ALKPHOS 77  --   --   --   BILITOT 0.4  --   --   --    ------------------------------------------------------------------------------------------------------------------ No results for input(s): CHOL, HDL, LDLCALC, TRIG, CHOLHDL, LDLDIRECT in the last 72 hours.  Lab Results  Component Value Date   HGBA1C 5.9 (H) 02/24/2016    ------------------------------------------------------------------------------------------------------------------ No results for input(s): TSH, T4TOTAL, T3FREE, THYROIDAB in the last 72 hours.  Invalid input(s): FREET3 ------------------------------------------------------------------------------------------------------------------ No results for input(s): VITAMINB12, FOLATE, FERRITIN, TIBC, IRON, RETICCTPCT in the last 72 hours.  Coagulation profile No results for input(s): INR, PROTIME in the last 168 hours.  No results for input(s): DDIMER in the last 72 hours.  Cardiac Enzymes  Recent Labs Lab 03/19/16 0226 03/19/16 0808  TROPONINI <0.03 <0.03   ------------------------------------------------------------------------------------------------------------------ No results found for: BNP  Inpatient Medications  Scheduled Meds: . aspirin EC  81 mg Oral Daily  . bisoprolol  2.5 mg Oral Daily  . budesonide (PULMICORT) nebulizer solution  0.25 mg Nebulization BID  . calcium-vitamin D  2 tablet Oral Q breakfast  . ceFEPime (MAXIPIME) IV  2 g Intravenous Q24H  . cholecalciferol  2,000 Units Oral Daily  . clopidogrel  75 mg Oral Daily  . enoxaparin (LOVENOX) injection  40 mg Subcutaneous Daily  . furosemide  40 mg Oral Daily  . ipratropium-albuterol  3 mL Nebulization Q4H  . isosorbide mononitrate  30 mg Oral Daily  . losartan  25 mg Oral Daily  . methylPREDNISolone (SOLU-MEDROL) injection  40 mg Intravenous Q8H  . pantoprazole  40 mg Oral Daily  . simvastatin  40 mg Oral QHS  . spironolactone  12.5 mg Oral Daily  . vancomycin  500 mg Intravenous Q12H   Continuous Infusions:  PRN Meds:.acetaminophen **OR** acetaminophen, ipratropium-albuterol, nitroGLYCERIN, ondansetron **OR** ondansetron (ZOFRAN) IV  Micro Results No results found for this or any previous visit (from the past 240 hour(s)).  Radiology Reports Dg Chest 2 View  Result Date: 03/11/2016 CLINICAL DATA:   Shortness of Breath EXAM: CHEST  2 VIEW COMPARISON:  11/18/2015 FINDINGS: Cardiac shadow is within normal limits. Emphysematous changes are again seen similar to that noted on prior CT. No focal infiltrate or sizable effusion is seen. Postsurgical changes are noted in the right hilum stable from the prior exam. Stable fullness at the surgical site on the right is noted. No acute bony abnormality is noted. IMPRESSION: No change from the prior exam. Electronically Signed   By: Inez Catalina M.D.   On: 03/11/2016  11:09   Ct Angio Chest Pe W And/or Wo Contrast  Result Date: 03/11/2016 CLINICAL DATA:  Acute onset of shortness of breath. Cough for 3 weeks. Myocardial infarction 2 weeks ago. EXAM: CT ANGIOGRAPHY CHEST WITH CONTRAST TECHNIQUE: Multidetector CT imaging of the chest was performed using the standard protocol during bolus administration of intravenous contrast. Multiplanar CT image reconstructions and MIPs were obtained to evaluate the vascular anatomy. CONTRAST:  100 cc Isovue 370 COMPARISON:  Chest x-ray dated 03/11/2016 and CT scan dated 11/18/2015 FINDINGS: No pulmonary emboli. There is new obstruction of two segmental bronchi to the right lower lobe adjacent to a surgical staple line best seen on images 51 through 58 of series 8 and images 68 through 77 of series 9 and images 88 through 99 of series 5. The Stable emphysema with scarring at the lung apices as well as is stable scarring in the superior segment of the left lower lobe posteriorly and at the right lung base posteriorly. Heart size is normal. No acute bone abnormality. Aortic atherosclerosis. Coronary artery calcifications. Review of the MIP images confirms the above findings. IMPRESSION: 1. No pulmonary emboli. 2. Soft tissue fills 2 segmental bronchi in the right lower lobe next to a surgical staple line at the site of prior partial right lower lobectomy. While this could represent secretions or scarring from radiation therapy, the possibly  of recurrent tumor should be considered. Electronically Signed   By: Lorriane Shire M.D.   On: 03/11/2016 13:41   Dg Chest Port 1 View  Result Date: 03/18/2016 CLINICAL DATA:  75 y/o  F; shortness of breath.  History of COPD. EXAM: PORTABLE CHEST 1 VIEW COMPARISON:  Chest CT 03/11/2016.  Chest radiograph 03/11/2016. FINDINGS: Stable cardiac silhouette given differences in positioning technique. Aortic arch calcifications. Nodular opacities in the right infrahilar region and right basilar patchy opacity increased in comparison with prior radiographs. IMPRESSION: Right basilar opacity increased from prior radiographs may represent developing pneumonia or atelectasis given right lower lobe airway obstruction on prior CT. Electronically Signed   By: Kristine Garbe M.D.   On: 03/18/2016 22:46   Azaiah Mello M.D on 03/19/2016 at 11:05 AM  Between 7am to 7pm - Pager - [phone]  After 7pm go to [email or web address] - password [facility]  [facility] -  Office  [phone]

## 2016-03-19 NOTE — Consult Note (Addendum)
   [facility] CM Inpatient Consult   03/19/2016  [name] [birth year] [id number]  Patient was screened for services through her eligibility with [employer].  She endorses Dr. Ria Bush as her primary care provider.  Met with the patient, brother [Darryl Moser] and son Noel Christmas at the bedside to explain [facility] Care Management services.  Patient is alert and oriented but her brother speaks of his concerns for her.  He states she has good days and bad days with her memory since her stroke.  Chart review reveals is a 75 y.o. female with CAD status post stenting and recent cardiac cath in 02/24/2016, ischemic cardiomyopathy last EF measured was 35%, COPD was recently started on Spiriva 3 days ago started developing worsening shortness of breath and wheezing last few days. Patient states her shortness of breath actually started after last admission but has worsened recently. After taking the Spiriva which was recently started patient's wheezing worsened acutely with productive cough and had been coughing at least for one hour continuously. In the ER patient was found to be wheezing and chest x-ray shows possible infiltrates.  Patient was given antibiotics for possible healthcare associated pneumonia and admitted for further management COPD exacerbation and possible pneumonia. Family states that the patient is waiting too long to call for help.  She has an alert system but she states, "I just don't know when the right time to use it.  I just don't want to use it and it's not the right time."  Brother states that the patient lives alone and feels that she needs to be thoroughly checked out because she is alone and is having trouble with low blood pressure and heart rate.  He states, "I feel that her low blood pressure and pulse issues have not been addressed and her medicines for her lungs need to be right and checked out before she goes back to being alone.  She will forget or has difficulty  deciding when things need to be checked out."  Patient and family said she was receiving rehab and wanted to know if a therapist would check her out.  Explained that this information will be share with the inpatient RNCM for further follow up and needs.  A brochure and contact information was accepted.  Patient states, "I don't want to sign up for anything just yet until I know where I am going."  Explained to her that care management does not interfere with or replace anything she needed. Brother  states, "well, some of the family need to be with her when people are talking to her about what is needed."  Information shared with inpatient RNCM.  Will follow up as needed.  Please contact for questions to:  Natividad Brood, RN BSN [facility] Liaison  [phone] business mobile phone Toll free office [phone]

## 2016-03-19 NOTE — Care Management Obs Status (Signed)
[place] NOTIFICATION   Patient Details  Name: [name] MRN: [id number] Date of Birth: [birth year]   Medicare Observation Status Notification Given:  Yes    Apolonio Schneiders, RN 03/19/2016, 7:05 PM

## 2016-03-19 NOTE — Progress Notes (Signed)
Pt complains of slight headache that is dull. Pt refused any pain medication at this time Eastin Swing A Archivist, RN

## 2016-03-19 NOTE — Telephone Encounter (Signed)
Per chart review tab pt was admitted to [facility] on 03/18/16.

## 2016-03-20 ENCOUNTER — Other Ambulatory Visit: Payer: Self-pay

## 2016-03-20 ENCOUNTER — Ambulatory Visit: Payer: PPO

## 2016-03-20 ENCOUNTER — Inpatient Hospital Stay (HOSPITAL_COMMUNITY): Payer: PPO

## 2016-03-20 ENCOUNTER — Telehealth: Payer: Self-pay

## 2016-03-20 DIAGNOSIS — J189 Pneumonia, unspecified organism: Secondary | ICD-10-CM | POA: Diagnosis not present

## 2016-03-20 DIAGNOSIS — I130 Hypertensive heart and chronic kidney disease with heart failure and stage 1 through stage 4 chronic kidney disease, or unspecified chronic kidney disease: Secondary | ICD-10-CM | POA: Diagnosis not present

## 2016-03-20 DIAGNOSIS — I2510 Atherosclerotic heart disease of native coronary artery without angina pectoris: Secondary | ICD-10-CM

## 2016-03-20 DIAGNOSIS — I5042 Chronic combined systolic (congestive) and diastolic (congestive) heart failure: Secondary | ICD-10-CM | POA: Diagnosis not present

## 2016-03-20 DIAGNOSIS — N183 Chronic kidney disease, stage 3 (moderate): Secondary | ICD-10-CM | POA: Diagnosis not present

## 2016-03-20 DIAGNOSIS — J9601 Acute respiratory failure with hypoxia: Secondary | ICD-10-CM | POA: Diagnosis not present

## 2016-03-20 DIAGNOSIS — I255 Ischemic cardiomyopathy: Secondary | ICD-10-CM | POA: Diagnosis not present

## 2016-03-20 DIAGNOSIS — J441 Chronic obstructive pulmonary disease with (acute) exacerbation: Secondary | ICD-10-CM | POA: Diagnosis not present

## 2016-03-20 DIAGNOSIS — J440 Chronic obstructive pulmonary disease with acute lower respiratory infection: Secondary | ICD-10-CM | POA: Diagnosis not present

## 2016-03-20 DIAGNOSIS — E440 Moderate protein-calorie malnutrition: Secondary | ICD-10-CM | POA: Diagnosis not present

## 2016-03-20 DIAGNOSIS — I959 Hypotension, unspecified: Secondary | ICD-10-CM | POA: Diagnosis not present

## 2016-03-20 DIAGNOSIS — Z681 Body mass index (BMI) 19 or less, adult: Secondary | ICD-10-CM | POA: Diagnosis not present

## 2016-03-20 LAB — ECHOCARDIOGRAM COMPLETE
Height: 67 in
WEIGHTICAEL: 1865.6 [oz_av]

## 2016-03-20 LAB — BASIC METABOLIC PANEL
Anion gap: 9 (ref 5–15)
BUN: 15 mg/dL (ref 6–20)
CALCIUM: 9.4 mg/dL (ref 8.9–10.3)
CHLORIDE: 97 mmol/L — AB (ref 101–111)
CO2: 28 mmol/L (ref 22–32)
CREATININE: 0.9 mg/dL (ref 0.44–1.00)
Glucose, Bld: 143 mg/dL — ABNORMAL HIGH (ref 65–99)
Potassium: 4 mmol/L (ref 3.5–5.1)
SODIUM: 134 mmol/L — AB (ref 135–145)

## 2016-03-20 MED ORDER — METHYLPREDNISOLONE SODIUM SUCC 40 MG IJ SOLR
40.0000 mg | Freq: Two times a day (BID) | INTRAMUSCULAR | Status: DC
  Administered 2016-03-20 – 2016-03-21 (×2): 40 mg via INTRAVENOUS
  Filled 2016-03-20 (×2): qty 1

## 2016-03-20 MED ORDER — DOCUSATE SODIUM 100 MG PO CAPS
100.0000 mg | ORAL_CAPSULE | Freq: Two times a day (BID) | ORAL | Status: DC | PRN
  Administered 2016-03-20: 100 mg via ORAL
  Filled 2016-03-20: qty 1

## 2016-03-20 NOTE — Telephone Encounter (Signed)
Reviewed chart. It seems like no discharge is being planned over next 1-2 days.  Recommend they discuss concerns with hospital doc's and ensure they get [facility] set up.  I will see her when they discharge her home.

## 2016-03-20 NOTE — Progress Notes (Signed)
Ms. Polivka' niece, Reola Calkins called with concern that her aunt needs to go to SNF for a couple of weeks of inpatient rehab prior to going home. Ms. Tarbell gave permission to speak her niece and I did so at patient's bedside. I contacted Dr. Waldron [facility] and informed him of above request.  He stated PT evaluation has been completed and Ms. Lampi does not qualify for neccessity of inpatient rehab and would have to pay out of pocket.  I will inform Reola Calkins @ [phone] of answer to her question.

## 2016-03-20 NOTE — Consult Note (Signed)
   [facility] CM Inpatient Consult   03/20/2016  Khori Underberg [place] 20-Sep-1940 [id number]  Call received from patient. HIPAA verified.  Told patient the writer  will come by her hospital room to follow up with questions.  Met with the patient who states she was concerned with going home and supposed to be discharged tomorrow.  Patient states she was not sure if she will be able to tolerate the outpatient cardiopulmonary rehab when going back.  She states, "I think I got my pneumonia from the rehab center and that Spiriva just caused it to flare up and cause my current problem."  Encouraged her to follow up with her pulmonologist regarding her medication and syptom concerns. Encouraged her to follow up with the inpatient RNCM regarding services she needs. Offered [facility] Care Management again. Patient states that her brother is planning to call about her needs. 2:30 pm: Call was received from patient's brother Scarlette Ar from [place] and HIPAA verified.  He states he wanted to speak again about his concerns of her being home alone.  Spoke wot inpatient Care Manager, Hassan Rowan, regarding potential needs for Home Health RN at discharge.  States patient will have a co-pay with [employer] plan.  Encouraged the brother to follow up with inpatient case management staff, as well for needs and concern.  Explained that [facility] is available for post follow up calls and a monthly home visit under her benefits at no cost.   4:40 pm: Spoke with patient again at the bedside.  She consents to [facility] Management for follow up. Consent form signed.  Services does not replace or interfere with needs arranaged by inpatient care management staff.  For questions, please contact:  Natividad Brood, RN BSN [facility] Liaison  [phone] business mobile phone Toll free office [phone]

## 2016-03-20 NOTE — Progress Notes (Signed)
Pt complaints of chest pain described as pulsating 7/10, BP 95/51 HR 74; NP on call notified of event and ordered to give Nitroglycerin 5 mg PRN for pain and 12 lead EKG; placed on O2 via [place] at 2L.  At 2126, Nitro 5 mg given sublingually, after 5 minutes, pt stated that her pain went down to 4/10; BP taken was 86/49 HR 74.  At 2145, pt. stated her pain is about 1/10 and feels much better and relaxed. Will continue to monitor.

## 2016-03-20 NOTE — Progress Notes (Signed)
Echocardiogram 2D Echocardiogram has been performed.  Aggie Cosier 03/20/2016, 10:45 AM

## 2016-03-20 NOTE — Progress Notes (Signed)
   03/20/16 1050  OT Visit Information  Last OT Received On 03/20/16  Reason Eval/Treat Not Completed Patient at procedure or test/ unavailable (having echo)  [facility], OTR/L  [phone] 03/20/2016

## 2016-03-20 NOTE — Telephone Encounter (Signed)
[name] left v/m (do not see DPR signed); pt is presently admitted to [facility]; recently pt has had heart attack, pneumonia. Almyra Free request cb to try to help pt; [facility] doctor wants to discharge pt and pt is not able to take care of herself. Please advise.

## 2016-03-20 NOTE — Telephone Encounter (Signed)
Received a call from family member to tell us that Sheriann has been admitted to hospital and will not be coming to class.  Reported that Winnie had trouble breathing at home, used an inhaler and breathing difficulties increased.  Called EMS and was consequently admitted.

## 2016-03-20 NOTE — Evaluation (Signed)
Occupational Therapy Evaluation Patient Details Name: [name] MRN: [id number] DOB: [birth year] Today's Date: 03/20/2016    History of Present Illness Pt is a pleasant 75 y/o female admitted for SOB and COPD exacerbation. PMH including but not limited to COPD, CAD, MI in 2010, STEMI 02/24/16, CHF, CKD, CVA in 2013, HTN, lung cancer and macular degeneration.    Clinical Impression   Pt admitted with above. She demonstrates the below listed deficits and will benefit from continued OT to maximize safety and independence with BADLs.  Pt presents to OT with generalized weakness and impaired balance.  She lives alone and had intermittent assist from family for IADLs PTA.  She currently requires supervision for ADLs and will progress to mod I fairly quickly.  She does, however, fatigue with ~25-30 mins standing activity with DOE 3/4.  She will require assist with IADLs, and would not recommend her driving at this time.   Long discussion with pt and son that she does not require the intensity of SNF level rehab.  May benefit from follow up PT for balance and vestibular deficits - have discussed with them, and they will further assess needs with possible recommendations for [facility] vs OP.  May benefit from [facility].  Pt ADAMANTLY denies falls - was asked 4 times during eval.  Will follow acutely.       Follow Up Recommendations  No OT follow up;Supervision - Intermittent    Equipment Recommendations  None recommended by OT    Recommendations for Other Services       Precautions / Restrictions Precautions Precautions: Fall      Mobility Bed Mobility                  Transfers Overall transfer level: Needs assistance Equipment used: None Transfers: Sit to/from Stand;Stand Pivot Transfers Sit to Stand: Supervision Stand pivot transfers: Supervision            Balance Overall balance assessment: Needs assistance Sitting-balance support: Feet supported Sitting balance-Leahy Scale:  Good     Standing balance support: During functional activity Standing balance-Leahy Scale: Fair Standing balance comment: relies on UE support when performing ADLs.  Was able to retrieve items from floor x 2 without LOB, but does have to keep eyes fixated on object as she reports she gets dizzy if she looks down                    [employer] Sit to Stand: Able to stand  independently using hands Standing Unsupported: Able to stand safely 2 minutes Sitting with Back Unsupported but Feet Supported on Floor or Stool: Able to sit safely and securely 2 minutes Stand to Sit: Controls descent by using hands Transfers: Able to transfer safely, minor use of hands Standing Unsupported with Eyes Closed: Able to stand 10 seconds safely Standing Ubsupported with Feet Together: Able to place feet together independently and stand for 1 minute with supervision From Standing, Reach Forward with Outstretched Arm: Can reach forward >12 cm safely (5") From Standing Position, Pick up Object from Floor: Able to pick up shoe, needs supervision Turn 360 Degrees: Able to turn 360 degrees safely in 4 seconds or less Standing Unsupported, One Foot in Front: Needs help to step but can hold 15 seconds Standing on One Leg: Tries to lift leg/unable to hold 3 seconds but remains standing independently        ADL Overall ADL's : Needs assistance/impaired Eating/Feeding: Independent   Grooming: Wash/dry  hands;Wash/dry face;Oral care;Brushing hair;Supervision/safety;Standing   Upper Body Bathing: Set up;Sitting   Lower Body Bathing: Supervison/ safety;Sit to/from stand   Upper Body Dressing : Set up;Sitting   Lower Body Dressing: Supervision/safety;Sit to/from stand   Toilet Transfer: Supervision/safety;Ambulation;Comfort height toilet;RW   Toileting- Clothing Manipulation and Hygiene: Supervision/safety;Sit to/from stand   Tub/ Shower Transfer: Tub  transfer;Supervision/safety;Ambulation Tub/Shower Transfer Details (indicate cue type and reason): Pt stabilizes with unilateral UE on wall/surface  Functional mobility during ADLs: Supervision/safety General ADL Comments: Pt able to simulate shower in standing with supervision and no LOB.  She does stabilize herself with UE when standing on one foot to bathe feet.  She was able to retrieve bag of clothing hanging in closet, don robe and hang clothing bag back in closet with supervision      Vision     Perception     Praxis      Pertinent Vitals/Pain Pain Assessment: Faces Faces Pain Scale: Hurts little more Pain Location: Rt chest pain - RN notified.   Pain Descriptors / Indicators: Cramping Pain Intervention(s): Monitored during session     Hand Dominance Right   Extremity/Trunk Assessment Upper Extremity Assessment Upper Extremity Assessment: Overall WFL for tasks assessed   Lower Extremity Assessment Lower Extremity Assessment: Defer to PT evaluation   Cervical / Trunk Assessment Cervical / Trunk Assessment: Normal   Communication Communication Communication: No difficulties   Cognition Arousal/Alertness: Awake/alert Behavior During Therapy: WFL for tasks assessed/performed Overall Cognitive Status: Within Functional Limits for tasks assessed                     General Comments       Exercises       Shoulder Instructions      Home Living Family/patient expects to be discharged to:: Private residence Living Arrangements: Alone Available Help at Discharge: Family;Available PRN/intermittently Type of Home: House Home Access: Level entry     Home Layout: One level     Bathroom Shower/Tub: Tub/shower unit;Curtain   Bathroom Toilet: Standard     Home Equipment: None          Prior Functioning/Environment Level of Independence: Needs assistance  Gait / Transfers Assistance Needed: Pt reports independent with ambulation.  She adamantly  denies falls, and was asked 4 different times during eval.  Family has reported to nursing they have found her on the floor x 2.   ADL's / Homemaking Assistance Needed: Pt reports her sister in law  washes her dishes about once/week, and that family has been grocery shopping for her.  She reports she has only driven x 1 since she had her MI, and that was to go to cardiac rehab.  She reports she is too fatigued to perform cleaning tasks         OT Diagnosis: Generalized weakness   OT Problem List: Decreased strength;Decreased activity tolerance;Impaired balance (sitting and/or standing)   OT Treatment/Interventions: Self-care/ADL training;Therapeutic exercise;DME and/or AE instruction;Therapeutic activities;Patient/family education    OT Goals(Current goals can be found in the care plan section) Acute Rehab OT Goals Patient Stated Goal: to be able to clean my house and go again  OT Goal Formulation: With patient Time For Goal Achievement: 04/03/16 Potential to Achieve Goals: Good ADL Goals Pt Will Perform Grooming: with modified independence;standing Pt Will Perform Upper Body Bathing: with modified independence;sitting;standing Pt Will Perform Lower Body Bathing: with modified independence;sit to/from stand Pt Will Perform Upper Body Dressing: with modified independence;sitting Pt Will Perform Lower  Body Dressing: with modified independence;sit to/from stand Pt Will Transfer to Toilet: with modified independence;ambulating;regular height toilet Pt Will Perform Toileting - Clothing Manipulation and hygiene: with modified independence;sit to/from stand Pt Will Perform Tub/Shower Transfer: Tub transfer;with modified independence;ambulating  OT Frequency: Min 2X/week   Barriers to D/C: Decreased caregiver support          Co-evaluation              End of Session    Activity Tolerance: Patient tolerated treatment well Patient left: in chair;with call bell/phone within  reach;with family/visitor present   Time: 5041-3643 OT Time Calculation (min): 41 min Charges:  OT General Charges $OT Visit: 1 Procedure OT Evaluation $OT Eval Moderate Complexity: 1 Procedure OT Treatments $Self Care/Home Management : 8-22 mins $Therapeutic Activity: 8-22 mins G-Codes:    Tobyn Osgood M 03/24/2016, 7:56 PM

## 2016-03-20 NOTE — Progress Notes (Signed)
Dr. Waldron Labs called and informed me  that he has ordered a Case Management consult with information about niece's request for another PT evaluation for discharge planning and destination for care once discharged. Almyra Free called and updated on recent plan of care.

## 2016-03-20 NOTE — Progress Notes (Addendum)
PROGRESS NOTE                                                                                                                                                                                                             Patient Demographics:    [name], is a 75 y.o. female, DOB - Jul 15, 1941, LKT:[id number]  Admit date - 03/18/2016   Admitting Physician Rise Patience, MD  Outpatient Primary MD for the patient is Ria Bush, MD  LOS - 1  Chief Complaint  Patient presents with  . Shortness of Breath       Brief Narrative   75 year old female with history of COPD, recent cardiac cath with 02/24/2016, presents with dyspnea, cough, workup significant for pneumonia and COPD exacerbation.   Subjective:    [name] today has, No headache, No chest pain, No abdominal pain - No Nausea, reports cough, productive with yellow phlegm.   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 (HCC)   COPD exacerbation (HCC)   Ischemic cardiomyopathy   COPD (chronic obstructive pulmonary disease) (HCC)   HCAP (healthcare-associated pneumonia)   Malnutrition of moderate degree  Acute respiratory failure with hypoxia  - probably combination of COPD exacerbation and pneumonia - Resolved, currently on room air   HACP - CT chest showing worsening lower lung pneumonia - Initially on IV vancomycin and cefepime for age CAP, MRSA negative, continue vancomycin, continue cefepime,  - follow on blood  cultures  COPD exacerbation - on IV Solu-Medrol nebulizer and Pulmicort for COPD exacerbation, improving, will decrease IV solumedrol, I think she can be transitioned to oral prednisone in 1-2 days.  CAD  - status post stenting with recent cardiac cath showing distal LAD not amenable to PCI -  patient was placed on Plavix and aspirin which will be continued along with beta blockers and statins. Denies any chest pain at this time. - Blood pressure on the lower side, which appears to be her baseline, she is asymptomatic  Ischemic cardiomyopathy last EF measured was 35% during cardiac cath  - appears euvolemic. Continue Lasix spironolactone and Imdur. Patient is also on Cozaar. - Patient  supposed to have repeat echo this Monday, she canceled it as an outpatient as she is in the hospital, we will perform it in the hospital.  Hyperlipidemia  - on statins.  Chronic kidney disease stage III  - creatinine appears to be at baseline  History of lung cancer  - status post resection - follow on CT chest  Code Status : Full  Family Communication  : D/W patient  Disposition Plan  : Home when stable, in 1-2 days  Consults  :  None  Procedures  : None  DVT Prophylaxis  :  Lovenox   Lab Results  Component Value Date   PLT 217 03/19/2016    Antibiotics  :   Anti-infectives    Start     Dose/Rate Route Frequency Ordered Stop   03/19/16 2200  ceFEPIme (MAXIPIME) 1 g in dextrose 5 % 50 mL IVPB  Status:  Discontinued     1 g 100 mL/hr over 30 Minutes Intravenous Every 24 hours 03/19/16 0129 03/19/16 0839   03/19/16 2000  ceFEPIme (MAXIPIME) 2 g in dextrose 5 % 50 mL IVPB     2 g 100 mL/hr over 30 Minutes Intravenous Every 24 hours 03/19/16 0839 03/27/16 1959   03/19/16 1600  vancomycin (VANCOCIN) 500 mg in sodium chloride 0.9 % 100 mL IVPB  Status:  Discontinued     500 mg 100 mL/hr over 60 Minutes Intravenous Every 12 hours 03/19/16 0134 03/20/16 0818   03/19/16 0145  ceFEPIme (MAXIPIME) 1 g in dextrose 5 % 50 mL IVPB     1 g 100 mL/hr over 30 Minutes Intravenous NOW 03/19/16 0129 03/19/16 0216   03/19/16 0130  ceFEPIme (MAXIPIME) 1 g in dextrose 5 % 50 mL IVPB  Status:  Discontinued     1 g 100 mL/hr over 30 Minutes Intravenous Every 8 hours 03/19/16 0127 03/19/16 0129    03/18/16 2315  vancomycin (VANCOCIN) IVPB 1000 mg/200 mL premix     1,000 mg 200 mL/hr over 60 Minutes Intravenous  Once 03/18/16 2301 03/19/16 0136   03/18/16 2315  piperacillin-tazobactam (ZOSYN) IVPB 3.375 g     3.375 g 100 mL/hr over 30 Minutes Intravenous  Once 03/18/16 2301 03/19/16 0106        Objective:   Vitals:   03/20/16 0415 03/20/16 0600 03/20/16 0848 03/20/16 0926  BP: (!) 80/42 (!) 91/48  (!) 103/57  Pulse:  63  68  Resp:    16  Temp:    97.5 F (36.4 C)  TempSrc:    Oral  SpO2:   96% 98%  Weight:      Height:        Wt Readings from Last 3 Encounters:  03/20/16 52.9 kg (116 lb 9.6 oz)  03/16/16 52.3 kg (115 lb 4 oz)  03/12/16 51.7 kg (114 lb)     Intake/Output Summary (Last 24 hours) at 03/20/16 1033 Last data filed at 03/20/16 1000  Gross per 24 hour  Intake             2050 ml  Output             1150 ml  Net              900 ml     Physical Exam  Awake Alert, Oriented X 3,  Energy.AT,PERRAL Supple Neck,No JVD,  Symmetrical Chest wall movement, Good air movement bilaterally, No wheezing RRR,No Gallops,Rubs or new Murmurs, No Parasternal Heave +ve B.Sounds, Abd Soft,  No tenderness,  No Cyanosis, Clubbing or edema, No new Rash or bruise      Data Review:    CBC  Recent Labs Lab 03/18/16 2225 03/18/16 2228 03/19/16 0226 03/19/16 0808  WBC 6.4  --  5.7 4.7  HGB 11.3* 11.6* 10.9* 11.5*  HCT 34.4* 34.0* 33.1* 34.9*  PLT 197  --  189 217  MCV 94.8  --  94.6 94.8  MCH 31.1  --  31.1 31.3  MCHC 32.8  --  32.9 33.0  RDW 13.6  --  13.6 13.7  LYMPHSABS 2.0  --   --   --   MONOABS 0.5  --   --   --   EOSABS 0.6  --   --   --   BASOSABS 0.0  --   --   --     Chemistries   Recent Labs Lab 03/18/16 2225 03/18/16 2228 03/19/16 0226 03/19/16 0808 03/20/16 0324  NA 138 138  --  136 134*  K 3.6 3.6  --  3.6 4.0  CL 103 99*  --  100* 97*  CO2 29  --   --  25 28  GLUCOSE 118* 116*  --  168* 143*  BUN 14 16  --  13 15  CREATININE 1.02*  0.90 0.98 0.99 0.90  CALCIUM 9.2  --   --  9.2 9.4  AST 47*  --   --   --   --   ALT 37  --   --   --   --   ALKPHOS 77  --   --   --   --   BILITOT 0.4  --   --   --   --    ------------------------------------------------------------------------------------------------------------------ No results for input(s): CHOL, HDL, LDLCALC, TRIG, CHOLHDL, LDLDIRECT in the last 72 hours.  Lab Results  Component Value Date   HGBA1C 5.9 (H) 02/24/2016   ------------------------------------------------------------------------------------------------------------------ No results for input(s): TSH, T4TOTAL, T3FREE, THYROIDAB in the last 72 hours.  Invalid input(s): FREET3 ------------------------------------------------------------------------------------------------------------------ No results for input(s): VITAMINB12, FOLATE, FERRITIN, TIBC, IRON, RETICCTPCT in the last 72 hours.  Coagulation profile No results for input(s): INR, PROTIME in the last 168 hours.  No results for input(s): DDIMER in the last 72 hours.  Cardiac Enzymes  Recent Labs Lab 03/19/16 0226 03/19/16 0808  TROPONINI <0.03 <0.03   ------------------------------------------------------------------------------------------------------------------ No results found for: BNP  Inpatient Medications  Scheduled Meds: . aspirin EC  81 mg Oral Daily  . bisoprolol  2.5 mg Oral Daily  . budesonide (PULMICORT) nebulizer solution  0.25 mg Nebulization BID  . calcium-vitamin D  2 tablet Oral Q breakfast  . ceFEPime (MAXIPIME) IV  2 g Intravenous Q24H  . cholecalciferol  2,000 Units Oral Daily  . clopidogrel  75 mg Oral Daily  . enoxaparin (LOVENOX) injection  40 mg Subcutaneous Daily  . feeding supplement (ENSURE ENLIVE)  237 mL Oral BID BM  . furosemide  40 mg Oral Daily  . ipratropium-albuterol  3 mL Nebulization QID  . isosorbide mononitrate  30 mg Oral Daily  . losartan  25 mg Oral Daily  . loteprednol  1 drop Both  Eyes Daily  . methylPREDNISolone (SOLU-MEDROL) injection  40 mg Intravenous Q12H  . pantoprazole  40 mg Oral Daily  . simvastatin  40 mg Oral QHS  . spironolactone  12.5 mg Oral Daily   Continuous Infusions:  PRN Meds:.acetaminophen **OR** acetaminophen, ipratropium-albuterol, nitroGLYCERIN, ondansetron **OR** ondansetron (ZOFRAN) IV  Micro Results  No results found for this or any previous visit (from the past 240 hour(s)).  Radiology Reports Dg Chest 2 View  Result Date: 03/11/2016 CLINICAL DATA:  Shortness of Breath EXAM: CHEST  2 VIEW COMPARISON:  11/18/2015 FINDINGS: Cardiac shadow is within normal limits. Emphysematous changes are again seen similar to that noted on prior CT. No focal infiltrate or sizable effusion is seen. Postsurgical changes are noted in the right hilum stable from the prior exam. Stable fullness at the surgical site on the right is noted. No acute bony abnormality is noted. IMPRESSION: No change from the prior exam. Electronically Signed   By: Inez Catalina M.D.   On: 03/11/2016 11:09   Ct Chest Wo Contrast  Result Date: 03/19/2016 CLINICAL DATA:  History of lung cancer. Opacity in lung seen on chest x-ray. EXAM: CT CHEST WITHOUT CONTRAST TECHNIQUE: Multidetector CT imaging of the chest was performed following the standard protocol without IV contrast. COMPARISON:  CT scan March 11, 2016 and chest x-ray March 18, 2016 FINDINGS: Cardiovascular: Coronary artery calcifications remain. The heart size is unchanged. No pericardial effusion. Atherosclerotic changes seen in the non aneurysmal thoracic aorta, stable. The pulmonary arteries are normal in caliber with no obvious abnormality. Mediastinum/Nodes: There is a small hiatal hernia. Mild distal wall thickening may be due to poor distention as it was not present 1 week ago. Shotty nodes in the mediastinum are more prominent in the interval including to the left of the trachea on series 2, image 53 and anterior to the  trachea on image 53. Given the 1 week interval, the increased prominence of nodes is likely reactive. Lungs/Pleura: Minimal pleural fluid is seen on the right. No left-sided pleural effusion. Emphysematous changes are again seen throughout the lungs. There is scarring in the apices which is stable. There is bronchial wall thickening in the right lung base. Opacification of right lower lobe bronchi has significantly improved in the interval with only minimal opacification remaining. There are patchy opacities in the right lung base, some of which are nodular. The bulk of opacity is seen posteriorly. The right basilar opacity is significantly worsened in the interval. Mild opacity posteriorly in the upper left lower lobe is stable. No other pulmonary nodules, masses, or infiltrates. Upper Abdomen: Evaluation of the upper abdomen is limited without contrast. Atherosclerotic change is seen in the upper abdominal aorta. Adrenal hyperplasia is seen with no adrenal mass noted. No acute abnormalities seen in the upper abdomen. Musculoskeletal: The bones are unchanged with no metastatic disease identified. IMPRESSION: 1. Worsening patchy opacities in the right lung base, some of which are nodular. Given the significant interval increase in 1 week, the findings are likely infectious. Recommend follow-up to resolution. The prominent nodes in the mediastinum are likely reactive. Recommend attention on follow-up. 2. Improved aeration of the right lower lobe bronchi. The previously seen opacification has almost completely resolved, suggesting the previous opacification was due to secretions rather than recurrence. 3. No other interval changes or acute abnormalities. Electronically Signed   By: Dorise Bullion III M.D   On: 03/19/2016 12:22   Ct Angio Chest Pe W And/or Wo Contrast  Result Date: 03/11/2016 CLINICAL DATA:  Acute onset of shortness of breath. Cough for 3 weeks. Myocardial infarction 2 weeks ago. EXAM: CT  ANGIOGRAPHY CHEST WITH CONTRAST TECHNIQUE: Multidetector CT imaging of the chest was performed using the standard protocol during bolus administration of intravenous contrast. Multiplanar CT image reconstructions and MIPs were obtained to evaluate the vascular anatomy.  CONTRAST:  100 cc Isovue 370 COMPARISON:  Chest x-ray dated 03/11/2016 and CT scan dated 11/18/2015 FINDINGS: No pulmonary emboli. There is new obstruction of two segmental bronchi to the right lower lobe adjacent to a surgical staple line best seen on images 51 through 58 of series 8 and images 68 through 77 of series 9 and images 88 through 99 of series 5. The Stable emphysema with scarring at the lung apices as well as is stable scarring in the superior segment of the left lower lobe posteriorly and at the right lung base posteriorly. Heart size is normal. No acute bone abnormality. Aortic atherosclerosis. Coronary artery calcifications. Review of the MIP images confirms the above findings. IMPRESSION: 1. No pulmonary emboli. 2. Soft tissue fills 2 segmental bronchi in the right lower lobe next to a surgical staple line at the site of prior partial right lower lobectomy. While this could represent secretions or scarring from radiation therapy, the possibly of recurrent tumor should be considered. Electronically Signed   By: Lorriane Shire M.D.   On: 03/11/2016 13:41   Dg Chest Port 1 View  Result Date: 03/18/2016 CLINICAL DATA:  75 y/o  F; shortness of breath.  History of COPD. EXAM: PORTABLE CHEST 1 VIEW COMPARISON:  Chest CT 03/11/2016.  Chest radiograph 03/11/2016. FINDINGS: Stable cardiac silhouette given differences in positioning technique. Aortic arch calcifications. Nodular opacities in the right infrahilar region and right basilar patchy opacity increased in comparison with prior radiographs. IMPRESSION: Right basilar opacity increased from prior radiographs may represent developing pneumonia or atelectasis given right lower lobe  airway obstruction on prior CT. Electronically Signed   By: Kristine Garbe M.D.   On: 03/18/2016 22:46   Mikeisha Lemonds M.D on 03/20/2016 at 10:33 AM  Between 7am to 7pm - Pager - [phone]  After 7pm go to [email or web address] - password [facility]  [facility] -  Office  [phone]

## 2016-03-20 NOTE — Progress Notes (Signed)
Patient declined the use of a bed alarm. Educated patient that it is for her safety. Stated that she knows to call for help before getting up. Will continue to monitor.  Celestia Khat, RN

## 2016-03-21 DIAGNOSIS — J441 Chronic obstructive pulmonary disease with (acute) exacerbation: Secondary | ICD-10-CM | POA: Diagnosis not present

## 2016-03-21 DIAGNOSIS — R269 Unspecified abnormalities of gait and mobility: Secondary | ICD-10-CM | POA: Diagnosis not present

## 2016-03-21 MED ORDER — LEVOFLOXACIN 500 MG PO TABS: 500.0000 mg | ORAL_TABLET | Freq: Every day | ORAL | 0 refills | Status: AC

## 2016-03-21 MED ORDER — LEVOFLOXACIN 500 MG PO TABS: 500.0000 mg | ORAL_TABLET | Freq: Every day | ORAL | 0 refills | Status: DC

## 2016-03-21 MED ORDER — METHYLPREDNISOLONE 4 MG PO TBPK: ORAL_TABLET | ORAL | 0 refills | Status: DC

## 2016-03-21 NOTE — Progress Notes (Signed)
PT Note Assessment completed. Treated for left BPPV today with good results.  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    Pt is agreeable.  Will follow acutely and full vestibular evaluation note to follow.  Thanks.  [place] [phone] (pager)

## 2016-03-21 NOTE — Progress Notes (Signed)
Patient is discharge to home accompanied by patient's son , son's  children and NT via wheelchair. Discharge instructions given . Patient verbalizes understanding. All personal belongings given. Telemetry box and IV removed prior to discharge and site in good condition.

## 2016-03-21 NOTE — Care Management Note (Signed)
Case Management Note  Patient Details  Name: [name] MRN: [id number] Date of Birth: 07/06/1941  Subjective/Objective:                  SOB and COPD exacerbation Action/Plan: Discharge planning Expected Discharge Date:  03/21/16               Expected Discharge Plan:  [place]  In-House Referral:     Discharge planning Services  CM Consult  Post Acute Care Choice:  Home Health Choice offered to:  Patient, Adult Children  DME Arranged:  3-N-1, Walker rolling with seat DME Agency:  [place]:  RN, PT, Nurse's Aide, Social Work [employer] Agency:  [facility]  Status of Service:  Completed, signed off  If discussed at [employer], dates discussed:    Additional Comments: CM met with pt in room to offer choice of home health agency.  Pt chooses [facility] to render HHPT/RN/aide/social worker.  Referral called to [facility] rep, Tiffany.  CM notified [facility] DME rep, Reggie to please deliver the rollator and 3n1 to room so pt can discharge.  No other CM needs were communicated. Dellie Catholic, RN 03/21/2016, 2:29 PM

## 2016-03-21 NOTE — Progress Notes (Signed)
Physical Therapy Treatment Patient Details Name: [name] MRN: [id number] DOB: 01-Nov-1940 Today's Date: 03/21/2016    History of Present Illness Pt is a pleasant 75 y/o female admitted for SOB and COPD exacerbation. PMH including but not limited to COPD, CAD, MI in 2010, STEMI 02/24/16, CHF, CKD, CVA in 2013, HTN, lung cancer and macular degeneration.     PT Comments    Pt admitted with above diagnosis. Pt currently with functional limitations due to vertigo deficits. Pt was able to ambulate in hall after treatment and felt less dizzy overall.  Will need outpt PT f/u to ensure that further vestibular therapy is not warranted.  Will folllow acutely while here.   Pt will benefit from skilled PT to increase their independence and safety with mobility to allow discharge to the venue listed below.    Follow Up Recommendations  Outpatient PT;Supervision - Intermittent (for vestibular rehab)     Equipment Recommendations  None recommended by PT    Recommendations for Other Services       Precautions / Restrictions Precautions Precautions: Fall Restrictions Weight Bearing Restrictions: No    Mobility  Bed Mobility Overal bed mobility: Independent             General bed mobility comments: pt OOB sitting up in recliner when PT entered room  Transfers Overall transfer level: Needs assistance Equipment used: None Transfers: Sit to/from Stand Sit to Stand: Supervision            Ambulation/Gait Ambulation/Gait assistance: Supervision Ambulation Distance (Feet): 200 Feet Assistive device: None Gait Pattern/deviations: Step-through pattern;Decreased stride length Gait velocity: decreased   General Gait Details: No LOB with min challenges to balance.  Pt does use compensation with ambulating as she does not turn head.    Stairs            Wheelchair Mobility    Modified Rankin (Stroke Patients Only)       Balance Overall balance assessment: Needs  assistance Sitting-balance support: No upper extremity supported;Feet supported Sitting balance-Leahy Scale: Good     Standing balance support: No upper extremity supported;During functional activity Standing balance-Leahy Scale: Fair Standing balance comment: can stand statiically without UE support.             High level balance activites: Direction changes;Sudden stops;Turns High Level Balance Comments: min guard for above without LOB.    Cognition Arousal/Alertness: Awake/alert Behavior During Therapy: WFL for tasks assessed/performed Overall Cognitive Status: Within Functional Limits for tasks assessed                      Exercises      General Comments General comments (skin integrity, edema, etc.): Pt tested positive for left BPPV. Treated with canalith repositioning maneuver with pt initially 0/10 dizzy to 4/10 dizzy with treatment and on departure from room 1/10.  Pt reports that usually when she moved head up she was dizzy and she already noted after the repositioning that this was better.       Pertinent Vitals/Pain Pain Assessment: No/denies pain  VSS    Home Living                      Prior Function            PT Goals (current goals can now be found in the care plan section) Progress towards PT goals: Progressing toward goals    Frequency  Min 3X/week    PT Plan  Discharge plan needs to be updated    Co-evaluation             End of Session Equipment Utilized During Treatment: Gait belt Activity Tolerance: Patient tolerated treatment well Patient left: in chair;with call bell/phone within reach     Time: 1036-1055 PT Time Calculation (min) (ACUTE ONLY): 19 min  Charges:  $Canalith Rep Proc: 8-22 mins                    G Codes:      Denice Paradise 2016/03/28, 2:26 PM Shavone Nevers,PT Acute Rehabilitation [phone] (pager)

## 2016-03-21 NOTE — Discharge Summary (Addendum)
Discharge Summary  [name] GEZ:[id number] DOB: 04/23/1941  PCP: Ria Bush, MD  Admit date: 03/18/2016 Discharge date: 03/21/2016   Recommendations for Outpatient Follow-up:  1. PCP 1-2 weeks 2. Cardiology within 4 weeks   Discharge Diagnoses:  Active Hospital Problems   Diagnosis Date Noted  . Malnutrition of moderate degree 03/20/2016  . COPD (chronic obstructive pulmonary disease) ([place]) 03/19/2016  . HCAP (healthcare-associated pneumonia) 03/19/2016  . Ischemic cardiomyopathy 02/27/2014  . COPD exacerbation ([place]) 10/26/2012  . 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 Chronic combined systolic and diastolic heart failure ([place]) 06/17/2009  . HLD (hyperlipidemia) 04/24/2009  . CAD, NATIVE VESSEL 04/24/2009  . Essential hypertension 04/23/2009    Resolved Hospital Problems   Diagnosis Date Noted Date Resolved  No resolved problems to display.    Discharge Condition: Stable   Diet recommendation: Cardiac   Vitals:   03/21/16 0530 03/21/16 0926  BP: (!) 90/42 (!) 91/50  Pulse: 70 (!) 59  Resp: 18   Temp: 97.6 F (36.4 C)     History of present illness:  75 year old female with history of COPD, recent cardiac cath with 02/24/2016, presents with dyspnea, cough, workup significant for pneumonia and COPD exacerbation.   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 She has done well, is now on room air and ready to be discharged home today to  complete a steroid taper as well as 5 more days of PO levaquin. She had some right sided chest pain, had Echo and EKG without acute changes or concerning findings. Chest pain correlates with location of her right lower lobe pneumonia. She also had some hypotension for which her Losartan is being held at discharge, critical cardiac medications such as Plavix, beta blocker and nitrates are being continued.  Discharge plan was discussed with patient as well as her son [name] over the phone, all questions were answered and they are in agreement with discharge home today.  After discussion with [name], spoke with patient's niece who expressed concern about her going to live at home. I explained that she is doing well now, and per our evaluation we may be limited in the home services we can order due to lack of skilled needs. However, I did speak with RN who will contact [facility]. Case manager will attempt to arrange home health services to include PT, Nursing and social work.  Patient's son is here in the hospital per nursing staff and is wanting to take his mother home.  Procedures:  ECHO   Consultations:  None   Discharge Exam: BP (!) 91/50   Pulse (!) 59   Temp 97.6 F (36.4 C) (Oral)   Resp 18   Ht '5\' 7"'$  (1.702 m)   Wt 54.1 kg (119 lb 4.8 oz) Comment: scale b  SpO2 100%   BMI 18.69 kg/m  General:  Alert, oriented, calm, in no acute distress  Eyes: pupils round and reactive to light and accomodation, clear sclerea Neck: supple, no masses, trachea mildline  Cardiovascular: RRR, no murmurs or rubs, no peripheral edema  Respiratory: clear to auscultation bilaterally, no wheezes, no crackles  Abdomen: soft, nontender, nondistended, normal bowel tones heard  Skin: dry, no rashes  Musculoskeletal: no joint effusions, normal range of motion  Psychiatric: appropriate affect, normal speech  Neurologic: extraocular muscles intact, clear speech, moving all extremities with intact sensorium     Discharge Instructions You were cared for by a hospitalist during your hospital stay. If you have any questions about your discharge medications or the care you received while you were in the hospital after you are discharged, you can call the unit and asked to speak with the hospitalist on call if the hospitalist that took care of you is not available. Once you are discharged, your primary care physician will handle any further medical issues. Please note that NO REFILLS for any discharge medications will be authorized once you are discharged, as it is imperative that you return to your primary care physician (or establish a relationship with a primary care physician if you do not have one) for your aftercare needs so that they can reassess your need for medications and monitor your lab values.  Discharge Instructions    AMB Referral to [facility]    Complete by:  As directed   Reason for consult:  Post hospital follow up- hospital re-admission call for [facility]   Diagnoses of:  COPD/ Pneumonia   Expected date of contact:  1-3 days (reserved for hospital discharges)   HTA patient:  Please assign to community nurse for transition of care calls and assess for home visits. Please assess for medication changes and needs.  Lives alone minimal support in community, did drive prior to recent hospitalization she states. Please contact brother Scarlette Ar [phone], if needed.  Questions please call:   Natividad Brood, RN BSN [facility]ia[facility]  [phone] business mobile phone Toll free office [phone]   Diet - low sodium heart healthy    Complete by:  As directed   Discharge instructions    Complete by:  As directed   Hold Aldactone for BP under 939 systolic.  Your COZAAR was held, please review continuation of this with cardiology or with PCP.   Increase activity slowly    Complete by:  As directed       Medication List    STOP taking these medications    losartan 50 MG tablet Commonly known as:  COZAAR     TAKE these medications   albuterol 108 (90 Base) MCG/ACT inhaler Commonly known as:  PROAIR HFA Inhale 2 puffs into the lungs every 4 (four) hours as needed for wheezing or shortness of breath.   aspirin EC 81 MG tablet Take 1 tablet (81 mg total) by mouth daily.   bisoprolol 5 MG tablet Commonly known as:  ZEBETA Take 0.5 tablets (2.5 mg total) by mouth daily.   calcium-vitamin D 500-200 MG-UNIT tablet Take 2 tablets by mouth daily.   clopidogrel 75 MG tablet Commonly known as:  PLAVIX Take 1 tablet (75 mg total) by mouth daily.   denosumab 60 MG/ML Soln injection Commonly known as:  PROLIA Inject 60 mg into the skin every 6 (six) months. Administer in upper arm, thigh, or abdomen   furosemide 40 MG tablet Commonly known as:  LASIX TAKE 1 TABLET BY MOUTH DAILY   isosorbide mononitrate 30 MG 24 hr tablet Commonly known as:  IMDUR TAKE 1 TABLET BY MOUTH DAILY   levofloxacin  500 MG tablet Commonly known as:  LEVAQUIN Take 1 tablet (500 mg total) by mouth daily.   LOTEMAX 0.5 % ophthalmic suspension Generic drug:  loteprednol Place 1 drop into both eyes daily.   methylPREDNISolone 4 MG Tbpk tablet Commonly known as:  MEDROL DOSEPAK Taper per package instructions.   nitroGLYCERIN 0.4 MG SL tablet Commonly known as:  NITROSTAT Place 1 tablet (0.4 mg total) under the tongue every 5 (five) minutes as needed for chest pain.   pantoprazole 40 MG tablet Commonly known as:  PROTONIX Take 1 tablet (40 mg total) by mouth daily.   simvastatin 40 MG tablet Commonly known as:  ZOCOR TAKE ONE TABLET BY MOUTH AT BEDTIME   spironolactone 25 MG tablet Commonly known as:  ALDACTONE TAKE 1/2  TABLET BY MOUTH ONCE DAILY.   tiotropium 18 MCG inhalation capsule Commonly known as:  SPIRIVA HANDIHALER Place 1 capsule (18 mcg total) into inhaler and inhale daily.   Vitamin D 2000 units Caps Take 1 capsule by mouth daily.       Allergies  Allergen Reactions  . Actonel [Risedronate Sodium] Other (See Comments)    Headache  . Amlodipine Other (See Comments)    Pedal edema  . Fosamax [Alendronate Sodium] Other (See Comments)    Unable to tolerate  . Lisinopril Cough  . Pravastatin Other (See Comments)    Constipation.  . Codeine Rash      The results of significant diagnostics from this hospitalization (including imaging, microbiology, ancillary and laboratory) are listed below for reference.    Significant Diagnostic Studies: Dg Chest 2 View  Result Date: 03/11/2016 CLINICAL DATA:  Shortness of Breath EXAM: CHEST  2 VIEW COMPARISON:  11/18/2015 FINDINGS: Cardiac shadow is within normal limits. Emphysematous changes are again seen similar to that noted on prior CT. No focal infiltrate or sizable effusion is seen. Postsurgical changes are noted in the right hilum stable from the prior exam. Stable fullness at the surgical site on the right is noted. No acute bony abnormality is noted. IMPRESSION: No change from the prior exam. Electronically Signed   By: Inez Catalina M.D.   On: 03/11/2016 11:09   Ct Chest Wo Contrast  Result Date: 03/19/2016 CLINICAL DATA:  History of lung cancer. Opacity in lung seen on chest x-ray. EXAM: CT CHEST WITHOUT CONTRAST TECHNIQUE: Multidetector CT imaging of the chest was performed following the standard protocol without IV contrast. COMPARISON:  CT scan March 11, 2016 and chest x-ray March 18, 2016 FINDINGS: Cardiovascular: Coronary artery calcifications remain. The heart size is unchanged. No pericardial effusion. Atherosclerotic changes seen in the non aneurysmal thoracic aorta, stable. The pulmonary arteries are normal in caliber with no obvious abnormality. Mediastinum/Nodes: There is a small hiatal hernia. Mild distal wall thickening may be due to poor distention as it was not present 1 week ago. Shotty nodes in the mediastinum are more prominent in the interval including to  the left of the trachea on series 2, image 53 and anterior to the trachea on image 53. Given the 1 week interval, the increased prominence of nodes is likely reactive. Lungs/Pleura: Minimal pleural fluid is seen on the right. No left-sided pleural effusion. Emphysematous changes are again seen throughout the lungs. There is scarring in the apices which is stable. There is bronchial wall thickening in the right lung base. Opacification of right lower lobe bronchi has significantly improved in the interval with only minimal opacification remaining. There are patchy opacities in the right lung base,  some of which are nodular. The bulk of opacity is seen posteriorly. The right basilar opacity is significantly worsened in the interval. Mild opacity posteriorly in the upper left lower lobe is stable. No other pulmonary nodules, masses, or infiltrates. Upper Abdomen: Evaluation of the upper abdomen is limited without contrast. Atherosclerotic change is seen in the upper abdominal aorta. Adrenal hyperplasia is seen with no adrenal mass noted. No acute abnormalities seen in the upper abdomen. Musculoskeletal: The bones are unchanged with no metastatic disease identified. IMPRESSION: 1. Worsening patchy opacities in the right lung base, some of which are nodular. Given the significant interval increase in 1 week, the findings are likely infectious. Recommend follow-up to resolution. The prominent nodes in the mediastinum are likely reactive. Recommend attention on follow-up. 2. Improved aeration of the right lower lobe bronchi. The previously seen opacification has almost completely resolved, suggesting the previous opacification was due to secretions rather than recurrence. 3. No other interval changes or acute abnormalities. Electronically Signed   By: Dorise Bullion III M.D   On: 03/19/2016 12:22   Ct Angio Chest Pe W And/or Wo Contrast  Result Date: 03/11/2016 CLINICAL DATA:  Acute onset of shortness of breath. Cough  for 3 weeks. Myocardial infarction 2 weeks ago. EXAM: CT ANGIOGRAPHY CHEST WITH CONTRAST TECHNIQUE: Multidetector CT imaging of the chest was performed using the standard protocol during bolus administration of intravenous contrast. Multiplanar CT image reconstructions and MIPs were obtained to evaluate the vascular anatomy. CONTRAST:  100 cc Isovue 370 COMPARISON:  Chest x-ray dated 03/11/2016 and CT scan dated 11/18/2015 FINDINGS: No pulmonary emboli. There is new obstruction of two segmental bronchi to the right lower lobe adjacent to a surgical staple line best seen on images 51 through 58 of series 8 and images 68 through 77 of series 9 and images 88 through 99 of series 5. The Stable emphysema with scarring at the lung apices as well as is stable scarring in the superior segment of the left lower lobe posteriorly and at the right lung base posteriorly. Heart size is normal. No acute bone abnormality. Aortic atherosclerosis. Coronary artery calcifications. Review of the MIP images confirms the above findings. IMPRESSION: 1. No pulmonary emboli. 2. Soft tissue fills 2 segmental bronchi in the right lower lobe next to a surgical staple line at the site of prior partial right lower lobectomy. While this could represent secretions or scarring from radiation therapy, the possibly of recurrent tumor should be considered. Electronically Signed   By: Lorriane Shire M.D.   On: 03/11/2016 13:41   Dg Chest Port 1 View  Result Date: 03/18/2016 CLINICAL DATA:  75 y/o  F; shortness of breath.  History of COPD. EXAM: PORTABLE CHEST 1 VIEW COMPARISON:  Chest CT 03/11/2016.  Chest radiograph 03/11/2016. FINDINGS: Stable cardiac silhouette given differences in positioning technique. Aortic arch calcifications. Nodular opacities in the right infrahilar region and right basilar patchy opacity increased in comparison with prior radiographs. IMPRESSION: Right basilar opacity increased from prior radiographs may represent  developing pneumonia or atelectasis given right lower lobe airway obstruction on prior CT. Electronically Signed   By: Kristine Garbe M.D.   On: 03/18/2016 22:46    Microbiology: Recent Results (from the past 240 hour(s))  Blood culture (routine x 2)     Status: None (Preliminary result)   Collection Time: 03/18/16 11:32 PM  Result Value Ref Range Status   Specimen Description BLOOD LEFT ANTECUBITAL  Final   Special Requests BOTTLES DRAWN AEROBIC AND  ANAEROBIC 5CC  Final   Culture NO GROWTH 1 DAY  Final   Report Status PENDING  Incomplete  Blood culture (routine x 2)     Status: None (Preliminary result)   Collection Time: 03/19/16 12:34 AM  Result Value Ref Range Status   Specimen Description BLOOD RIGHT ANTECUBITAL  Final   Special Requests BOTTLES DRAWN AEROBIC AND ANAEROBIC 5CC  Final   Culture NO GROWTH 1 DAY  Final   Report Status PENDING  Incomplete     Labs: Basic Metabolic Panel:  Recent Labs Lab 03/18/16 2225 03/18/16 2228 03/19/16 0226 03/19/16 0808 03/20/16 0324  NA 138 138  --  136 134*  K 3.6 3.6  --  3.6 4.0  CL 103 99*  --  100* 97*  CO2 29  --   --  25 28  GLUCOSE 118* 116*  --  168* 143*  BUN 14 16  --  13 15  CREATININE 1.02* 0.90 0.98 0.99 0.90  CALCIUM 9.2  --   --  9.2 9.4   Liver Function Tests:  Recent Labs Lab 03/18/16 2225  AST 47*  ALT 37  ALKPHOS 77  BILITOT 0.4  PROT 6.4*  ALBUMIN 3.1*   No results for input(s): LIPASE, AMYLASE in the last 168 hours. No results for input(s): AMMONIA in the last 168 hours. CBC:  Recent Labs Lab 03/18/16 2225 03/18/16 2228 03/19/16 0226 03/19/16 0808  WBC 6.4  --  5.7 4.7  NEUTROABS 3.3  --   --   --   HGB 11.3* 11.6* 10.9* 11.5*  HCT 34.4* 34.0* 33.1* 34.9*  MCV 94.8  --  94.6 94.8  PLT 197  --  189 217   Cardiac Enzymes:  Recent Labs Lab 03/19/16 0226 03/19/16 0808  TROPONINI <0.03 <0.03   BNP: BNP (last 3 results) No results for input(s): BNP in the last 8760  hours.  ProBNP (last 3 results) No results for input(s): PROBNP in the last 8760 hours.  CBG: No results for input(s): GLUCAP in the last 168 hours.  Time spent: 42 minutes were spent in preparing this discharge including medication reconciliation, counseling, and coordination of care.  Signed:  Rikayla Demmon [employer]  [facility] 03/21/2016, 10:41 AM

## 2016-03-22 ENCOUNTER — Other Ambulatory Visit: Payer: Self-pay | Admitting: Adult Health

## 2016-03-23 ENCOUNTER — Encounter: Payer: Self-pay | Admitting: Pulmonary Disease

## 2016-03-23 ENCOUNTER — Other Ambulatory Visit (HOSPITAL_COMMUNITY): Payer: PPO

## 2016-03-23 ENCOUNTER — Ambulatory Visit (INDEPENDENT_AMBULATORY_CARE_PROVIDER_SITE_OTHER)
Admission: RE | Admit: 2016-03-23 | Discharge: 2016-03-23 | Disposition: A | Discharge status: Home / Self Care | Payer: PPO | Source: Ambulatory Visit | Attending: Pulmonary Disease | Admitting: Pulmonary Disease

## 2016-03-23 ENCOUNTER — Ambulatory Visit (INDEPENDENT_AMBULATORY_CARE_PROVIDER_SITE_OTHER): Payer: PPO | Admitting: Pulmonary Disease

## 2016-03-23 ENCOUNTER — Other Ambulatory Visit: Payer: Self-pay

## 2016-03-23 ENCOUNTER — Ambulatory Visit: Payer: PPO | Admitting: Pulmonary Disease

## 2016-03-23 VITALS — BP 108/78 | HR 59 | Ht 66.0 in | Wt 116.8 lb

## 2016-03-23 DIAGNOSIS — J441 Chronic obstructive pulmonary disease with (acute) exacerbation: Secondary | ICD-10-CM

## 2016-03-23 DIAGNOSIS — J431 Panlobular emphysema: Secondary | ICD-10-CM | POA: Diagnosis not present

## 2016-03-23 DIAGNOSIS — J189 Pneumonia, unspecified organism: Secondary | ICD-10-CM | POA: Diagnosis not present

## 2016-03-23 DIAGNOSIS — R0602 Shortness of breath: Secondary | ICD-10-CM | POA: Diagnosis not present

## 2016-03-23 MED ORDER — BUDESONIDE 0.5 MG/2ML IN SUSP: 0.5000 mg | Freq: Two times a day (BID) | RESPIRATORY_TRACT | 5 refills | Status: DC

## 2016-03-23 MED ORDER — IPRATROPIUM-ALBUTEROL 0.5-2.5 (3) MG/3ML IN SOLN: RESPIRATORY_TRACT | 5 refills | Status: DC

## 2016-03-23 MED ORDER — FLUTICASONE FUROATE-VILANTEROL 100-25 MCG/INH IN AEPB: 1.0000 | INHALATION_SPRAY | Freq: Every day | RESPIRATORY_TRACT | 0 refills | Status: DC

## 2016-03-23 NOTE — Patient Instructions (Signed)
It was a pleasure taking care of you today!  You are diagnosed with Chronic Obstructive Pulmonary Disease or COPD.  COPD is a preventable and treatable disease that makes it difficult to empty air out of the lungs (airflow obstruction).  This can lead to shortness of breath.   Sometimes, when you have a lung infection, this can make your breathing worse, and will cause you to have a COPD flare-up or an acute exacerbation of COPD. Please call your primary care doctor or the office if you are having a COPD flare-up.   Smoking makes COPD worse.   Make sure you use your medications for COPD -- Maintenance medications :  Pulmicort nebulizer, 0.5 mg, 2x/day. Rinse your  mouth each time you use it.  Duoneb 4x/day and every 4 hours as needed for dyspnea.  Rescue medications: Albuterol 2 puffs every 4 hours as needed for shortness of breath.   Please rinse your mouth each time you use your maintenance medication.  Please call the office if you are having issues with your medications.  We will get a chest x-ray today. Continue your prednisone taper, Levaquin.  Return to clinic in 2-3 weeks with Dr. Lamonte Sakai or NP.

## 2016-03-23 NOTE — Assessment & Plan Note (Signed)
Patient recently treated for healthcare associated pneumonia. She had a chest CT scan early August which showed right lower lobe infiltrate. It appeared nodular. She had a repeat chest CT scan a week or so after that which showed more infiltrate at the right lower lobe. She was on broad-spectrum antibiotics and was discharged on Levaquin.  Currently, denies any fevers or chills or increased cough. She still has dyspnea which might be related to untreated COPD.  Plan for repeat chest x-ray today. If she has worsening infiltrate at the right lower lobe, she will need a repeat chest CT scan scan.

## 2016-03-23 NOTE — Patient Outreach (Signed)
Transition of care: Placed call to patient for initial outreach. No answer.  PLAN: Will attempt another outreach tomorrow.  Tomasa Rand, RN, BSN, CEN [facility] [employer] [phone]

## 2016-03-23 NOTE — Assessment & Plan Note (Signed)
Moderate COPD based on spirometry 08/2012. FEV1  1.47  65%, DLCO 51. Recent acute exacerbation of COPD. She felt better with nebulizers at the hospital. Since being discharged, she is not improved. Did not tolerate Symbicort. Finishing prednisone taper. Not at baseline.  Plan : 1. Start nebulizer medicines. Start Pulmicort, 0.5 mg twice a day. Rinse mouth each time using it. Start DuoNeb, 4 times a day and every 4 hours as needed. May also use albuterol 2 puffs every 4 hours as needed for dyspnea. We will order medicines and nebulizer today. We will try to expedite. Awaiting for nebulizer meds, she is to use Breo 1 puff daily. Told patient to hold off on the Breo once she has nebulizer medicines. 2. Finish off prednisone taper. I don't think she has flareup today. She is just taking longer than usual to get better. 3. If her breathing is not improved with the nebulizer medicines, may need repeat PFTs and assess the need for o2.  4. I extensively discussed plan with the patient and her brother.

## 2016-03-23 NOTE — Progress Notes (Signed)
Subjective:    Patient ID: [name], female    DOB: 07/19/1941, 75 y.o.   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  ROV 03/23/16  Patient is in the office is an urgent follow-up for dyspnea. She was admitted from August 23 until August 26 for acute dyspnea related to acute COPD exacerbation and pneumonia in the right lung. She improved and was discharged on by mouth Levaquin and prednisone taper.  Unfortunately, she has not significantly improved since being discharged. She is not on maintenance medicines for her COPD. She did not tolerate Symbicort. She felt better using nebulizers while she was admitted at the hospital. Finishing up Levaquin and prednisone taper. Not on oxygen.   Review of Systems  Constitutional: Negative.   HENT: Negative.   Eyes: Negative.   Respiratory: Positive for cough and shortness of breath.   Cardiovascular: Negative.   Gastrointestinal: Negative.   Endocrine: Negative.   Genitourinary: Negative.   Musculoskeletal: Negative.   Skin: Negative.   Allergic/Immunologic: Negative.   Neurological: Negative.   Hematological: Negative.   Psychiatric/Behavioral: Negative.   All other systems reviewed and are negative.  Past Medical History:  Diagnosis Date  . Arthritis   . CAD (coronary artery disease) 03/2009   s/p MI  . Chronic combined systolic and diastolic heart failure ([place]) 06/17/2009   Qualifier: Diagnosis of  By: Burt Knack, MD, Clayburn Pert   . CKD (chronic kidney disease) stage 3, GFR 30-59 ml/min   . Emphysema/COPD ([place])   . Ex-smoker quit 2008  . GERD (gastroesophageal reflux disease)   . Helicobacter pylori gastritis 10/2007   treated  . History of CVA (cerebrovascular accident) without residual deficits 01/2012, 06/2012   R hemorrhagic MCA 01/2012 with remote lacunar infarct L putamen and IC, rpt  06/2012 acute multifocal R MCA infarct with remote hemorrhagic strokes affecting L basal ganglia and periventricular white matter, full recovery  . HLD (hyperlipidemia)   . HTN (hypertension)   . Ischemic cardiomyopathy 02/27/2014  . Lung cancer ([place]) dx'd 07/2006   Lung CA, s/p resection, followed by Dr Earlie Server  . Macular degeneration   . Myocardial infarction [facility]) 03/2009   Acute myocardial infarction 2010 - treated with BMS of LCx. LVEF 50% with subsequent CHF  . Osteoporosis 09/2013   T -3.6 forearm 09/2013, T -4.5 forearm 04/2015     Family History  Problem Relation Age of Onset  . CAD Mother 64    MI  . Stroke Mother   . Hypertension Mother   . Heart attack Mother   . CAD Father 65  . Stroke Father   . Breast cancer Sister   . Diabetes Sister      Past Surgical History:  Procedure Laterality Date  . BREAST BIOPSY Left   . CARDIAC CATHETERIZATION N/A 02/24/2016   Procedure: Left Heart Cath and Coronary Angiography;  Surgeon: Belva Crome, MD;  Location: [place] [facility];  Service: Cardiovascular;  Laterality: N/A;  . CATARACT EXTRACTION Bilateral   . LUNG REMOVAL, PARTIAL Right 2008  . PARTIAL HYSTERECTOMY  1986   irregular periods, ovaries remain    Social History   Social History  . Marital status: Widowed    Spouse name: N/A  . Number of children: 1  . Years of education: N/A   Occupational History  . retired  insurance   Social History Main Topics  . Smoking status: Former Smoker    Packs/day: 2.00    Years: 40.00    Types: Cigarettes    Quit date: 07/27/2006  . Smokeless tobacco: Never Used  . Alcohol use No  . Drug use: No  . Sexual activity: No   Other Topics Concern  . Not on file   Social History Narrative   The patient is a widow and has one son and one dog.     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  She does not drink alcohol. 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   Ed:  HS   Activity: no regular exercise   Diet: some water, some fruits/vegetables    Lives in a one story home.      Allergies  Allergen Reactions  . Actonel [Risedronate Sodium] Other (See Comments)    Headache  . Amlodipine Other (See Comments)    Pedal edema  . Fosamax [Alendronate Sodium] Other (See Comments)    Unable to tolerate  . Lisinopril Cough  . Pravastatin Other (See Comments)    Constipation.  . Codeine Rash     Outpatient Medications Prior to Visit  Medication Sig Dispense Refill  . albuterol (PROAIR HFA) 108 (90 BASE) MCG/ACT inhaler Inhale 2 puffs into the lungs every 4 (four) hours as needed for wheezing or shortness of breath. 1 Inhaler 1  . aspirin EC 81 MG tablet Take 1 tablet (81 mg total) by mouth daily.    . bisoprolol (ZEBETA) 5 MG tablet Take 0.5 tablets (2.5 mg total) by mouth daily.    . Calcium Carbonate-Vitamin D (CALCIUM-VITAMIN D) 500-200 MG-UNIT per tablet Take 2 tablets by mouth daily.    . Cholecalciferol (VITAMIN D) 2000 UNITS CAPS Take 1 capsule by mouth daily.    . clopidogrel (PLAVIX) 75 MG tablet Take 1 tablet (75 mg total) by mouth daily. 30 tablet 12  . furosemide (LASIX) 40 MG tablet TAKE 1 TABLET BY MOUTH DAILY 30 tablet 11  . isosorbide mononitrate (IMDUR) 30 MG 24 hr tablet TAKE 1 TABLET BY MOUTH DAILY 30 tablet 3  . levofloxacin (LEVAQUIN) 500 MG tablet Take 1 tablet (500 mg total) by mouth daily. 5 tablet 0  . LOTEMAX 0.5 % ophthalmic suspension Place 1 drop into both eyes daily.     . methylPREDNISolone (MEDROL DOSEPAK) 4 MG TBPK tablet Taper per package instructions. 21 tablet 0  . nitroGLYCERIN (NITROSTAT) 0.4 MG SL tablet Place 1 tablet (0.4 mg total) under the tongue every 5 (five) minutes as needed for chest pain. 25 tablet 3  . pantoprazole (PROTONIX) 40 MG tablet Take 1 tablet (40 mg total) by mouth daily. 30 tablet 11  . simvastatin (ZOCOR) 40 MG tablet TAKE ONE TABLET BY MOUTH AT BEDTIME 30 tablet 11  . spironolactone  (ALDACTONE) 25 MG tablet TAKE 1/2  TABLET BY MOUTH ONCE DAILY. 30 tablet 6  . tiotropium (SPIRIVA HANDIHALER) 18 MCG inhalation capsule Place 1 capsule (18 mcg total) into inhaler and inhale daily. 30 capsule 12  . denosumab (PROLIA) 60 MG/ML SOLN injection Inject 60 mg into the skin every 6 (six) months. Administer in upper arm, thigh, or abdomen (Patient not taking: Reported on 03/23/2016) 60 mL 1   No facility-administered medications prior to visit.    Meds ordered this encounter  Medications  . budesonide (PULMICORT) 0.5 MG/2ML  nebulizer solution    Sig: Take 2 mLs (0.5 mg total) by nebulization 2 (two) times daily.    Dispense:  120 mL    Refill:  5  . ipratropium-albuterol (DUONEB) 0.5-2.5 (3) MG/3ML SOLN    Sig: Use 4 times a day and every 4 hrs. As needed    Dispense:  360 mL    Refill:  5         Objective:   Physical Exam Vitals:  Vitals:   03/23/16 1248  BP: 108/78  Pulse: (!) 59  SpO2: 97%  Weight: 116 lb 12.8 oz (53 kg)  Height: '5\' 6"'$  (1.676 m)    Constitutional/General:  Pleasant, well-nourished, well-developed, not in any distress,  Comfortably seating.  Well kempt  Body mass index is 18.85 kg/m. Wt Readings from Last 3 Encounters:  03/23/16 116 lb 12.8 oz (53 kg)  03/21/16 119 lb 4.8 oz (54.1 kg)  03/16/16 115 lb 4 oz (52.3 kg)    HEENT: Pupils equal and reactive to light and accommodation. Anicteric sclerae. Normal nasal mucosa.   No oral  lesions,  mouth clear,  oropharynx clear, no postnasal drip. (-) Oral thrush. No dental caries.  Airway - Mallampati class III  Neck: No masses. Midline trachea. No JVD, (-) LAD. (-) bruits appreciated.  Respiratory/Chest: Grossly normal chest. (-) deformity. (-) Accessory muscle use.  Symmetric expansion. (-) Tenderness on palpation.  Resonant on percussion.  Diminished BS on both lower lung zones. (-) , crackles, rhonchi.  Occasional wheezing.  (-) egophony  Cardiovascular: Regular rate and  rhythm,  heart sounds normal, no murmur or gallops, no peripheral edema  Gastrointestinal:  Normal bowel sounds. Soft, non-tender. No hepatosplenomegaly.  (-) masses.   Musculoskeletal:  Normal muscle tone. Normal gait.   Extremities: Grossly normal. (-) clubbing, cyanosis.  (-) edema  Skin: (-) rash,lesions seen.   Neurological/Psychiatric : alert, oriented to time, place, person. Normal mood and affect            Assessment & Plan:  COPD (chronic obstructive pulmonary disease) ([employer]) Moderate COPD based on spirometry 08/2012. FEV1  1.47  65%, DLCO 51. Recent acute exacerbation of COPD. She felt better with nebulizers at the hospital. Since being discharged, she is not improved. Did not tolerate Symbicort. Finishing prednisone taper. Not at baseline.  Plan : 1. Start nebulizer medicines. Start Pulmicort, 0.5 mg twice a day. Rinse mouth each time using it. Start DuoNeb, 4 times a day and every 4 hours as needed. May also use albuterol 2 puffs every 4 hours as needed for dyspnea. We will order medicines and nebulizer today. We will try to expedite. Awaiting for nebulizer meds, she is to use Breo 1 puff daily. Told patient to hold off on the Breo once she has nebulizer medicines. 2. Finish off prednisone taper. I don't think she has flareup today. She is just taking longer than usual to get better. 3. If her breathing is not improved with the nebulizer medicines, may need repeat PFTs and assess the need for o2.  4. I extensively discussed plan with the patient and her brother.  HCAP (healthcare-associated pneumonia) Patient recently treated for healthcare associated pneumonia. She had a chest CT scan early August which showed right lower lobe infiltrate. It appeared nodular. She had a repeat chest CT scan a week or so after that which showed more infiltrate at the right lower lobe. She was on broad-spectrum antibiotics and was discharged on Levaquin.  Currently, denies any fevers or  chills or  increased cough. She still has dyspnea which might be related to untreated COPD.  Plan for repeat chest x-ray today. If she has worsening infiltrate at the right lower lobe, she will need a repeat chest CT scan scan.    I personally reviewed previous images (Chest Xray, Chest Ct scan) done on this patient. I reviewed the reports on the images as well.     Return to clinic in 2-3 weeks with Dr. Lamonte Sakai or NP. We need to make sure that she is better.  I spent at least 25 minutes with the patient today and more than 50% was spent counseling the patient regarding disease and management and facilitating labs and medications.         Monica Becton, MD 03/23/2016, 1:39 PM [facility] Pager [phone] After 3 pm or if no answer, call [phone]

## 2016-03-24 ENCOUNTER — Other Ambulatory Visit: Payer: Self-pay

## 2016-03-24 ENCOUNTER — Telehealth: Payer: Self-pay | Admitting: Adult Health

## 2016-03-24 DIAGNOSIS — J441 Chronic obstructive pulmonary disease with (acute) exacerbation: Secondary | ICD-10-CM

## 2016-03-24 LAB — CULTURE, BLOOD (ROUTINE X 2)
Culture: NO GROWTH
Culture: NO GROWTH

## 2016-03-24 MED ORDER — BUDESONIDE 0.5 MG/2ML IN SUSP: 0.5000 mg | Freq: Two times a day (BID) | RESPIRATORY_TRACT | 5 refills | Status: DC

## 2016-03-24 MED ORDER — ALBUTEROL SULFATE HFA 108 (90 BASE) MCG/ACT IN AERS: 2.0000 | INHALATION_SPRAY | RESPIRATORY_TRACT | 1 refills | Status: DC | PRN

## 2016-03-24 MED ORDER — IPRATROPIUM-ALBUTEROL 0.5-2.5 (3) MG/3ML IN SOLN: RESPIRATORY_TRACT | 5 refills | Status: DC

## 2016-03-24 NOTE — Telephone Encounter (Signed)
Spoke with Amy w/ [facility] pharmacy. Pt is needing her proair refilled until she can get her neb meds straightneded. I have refilled this and nothing further needed

## 2016-03-24 NOTE — Telephone Encounter (Signed)
Called spoke with Lelon Frohlich at pharmacy. She states that dx code needs to be added to the duoneb and pulmicort prescription . I explained to her that I would send rx today. She voiced understanding and had no further needed. Both have been sent with dx code. Nothing further needed.

## 2016-03-24 NOTE — Patient Outreach (Signed)
Transition of Care Call: Placed call to patient. Patient answered. States that she has not yet heard from [facility] about her nebulizer machine or medications. Reports that her brother Scarlette Ar and niece take care of her medications.  Patient reports that she was seen by pulmonary yesterday and feels about the same. Reports that she has her inhalers and is using them as needed. Patient reports that she is unable to review her other medications but states that she has all her medications except the ones ordered yesterday.  Denies seeing home health nurse yet.   Placed call to primary MD office to assess nebulizer and medication concerns. Placed call to [facility] and left message for [facility] and requested a call back. Placed call to [facility] and was informed that RX has been sent to [facility] to be filled. [facility] and was informed that RX was missing diagnosis code and request was called to pulmonary. Placed call to patients brother Daryll Ermalene Postin and he states that he was going to buy a nebulizer machine from [employer]. Reports he is waiting to get [facility]. Placed call to [facility]. Informed that MD was sent request.  Call back from [facility] who reports patient was informed to pick up neb and copayment at the [place] office.  I inquired about nursing, physical therapy and social worker referral. Lenna Sciara reports that she will call me back. Informed patients brother of pending RX to be sent to pharmacy. Brother has confirmed that he has the nebulizer purchased.  Plan: Will call patient and or brother back tomorrow to inquire if medications were picked up and reassess needs. Also awaiting call back about home health services.   Tomasa Rand, RN, BSN, CEN [facility] [employer] [phone]

## 2016-03-24 NOTE — Telephone Encounter (Signed)
Spoke with [name]. Nothing has been set up at this point because she says insurance isn't wanting to cover anything. [facility] is supposed to be calling today to assist with things. If there are any issues, I have forwarded this to her case manager there to contact me directly.

## 2016-03-25 ENCOUNTER — Telehealth: Payer: Self-pay | Admitting: Adult Health

## 2016-03-25 ENCOUNTER — Other Ambulatory Visit: Payer: Self-pay

## 2016-03-25 DIAGNOSIS — Z7982 Long term (current) use of aspirin: Secondary | ICD-10-CM | POA: Diagnosis not present

## 2016-03-25 DIAGNOSIS — J189 Pneumonia, unspecified organism: Secondary | ICD-10-CM | POA: Diagnosis not present

## 2016-03-25 DIAGNOSIS — C3431 Malignant neoplasm of lower lobe, right bronchus or lung: Secondary | ICD-10-CM | POA: Diagnosis not present

## 2016-03-25 DIAGNOSIS — J441 Chronic obstructive pulmonary disease with (acute) exacerbation: Secondary | ICD-10-CM

## 2016-03-25 DIAGNOSIS — J440 Chronic obstructive pulmonary disease with acute lower respiratory infection: Secondary | ICD-10-CM | POA: Diagnosis not present

## 2016-03-25 DIAGNOSIS — E785 Hyperlipidemia, unspecified: Secondary | ICD-10-CM | POA: Diagnosis not present

## 2016-03-25 DIAGNOSIS — Z87891 Personal history of nicotine dependence: Secondary | ICD-10-CM | POA: Diagnosis not present

## 2016-03-25 DIAGNOSIS — I2510 Atherosclerotic heart disease of native coronary artery without angina pectoris: Secondary | ICD-10-CM | POA: Diagnosis not present

## 2016-03-25 DIAGNOSIS — I255 Ischemic cardiomyopathy: Secondary | ICD-10-CM | POA: Diagnosis not present

## 2016-03-25 DIAGNOSIS — Z7951 Long term (current) use of inhaled steroids: Secondary | ICD-10-CM | POA: Diagnosis not present

## 2016-03-25 DIAGNOSIS — E440 Moderate protein-calorie malnutrition: Secondary | ICD-10-CM | POA: Diagnosis not present

## 2016-03-25 DIAGNOSIS — Z7901 Long term (current) use of anticoagulants: Secondary | ICD-10-CM | POA: Diagnosis not present

## 2016-03-25 DIAGNOSIS — I5042 Chronic combined systolic (congestive) and diastolic (congestive) heart failure: Secondary | ICD-10-CM | POA: Diagnosis not present

## 2016-03-25 DIAGNOSIS — I110 Hypertensive heart disease with heart failure: Secondary | ICD-10-CM | POA: Diagnosis not present

## 2016-03-25 MED ORDER — BUDESONIDE 0.5 MG/2ML IN SUSP: 0.5000 mg | Freq: Two times a day (BID) | RESPIRATORY_TRACT | 5 refills | Status: DC

## 2016-03-25 MED ORDER — IPRATROPIUM-ALBUTEROL 0.5-2.5 (3) MG/3ML IN SOLN: RESPIRATORY_TRACT | 5 refills | Status: DC

## 2016-03-25 NOTE — Patient Outreach (Signed)
Care Coordination follow up: Received message from Dahlia Client who reports home health nursing is scheduled to see patient today to open case. Placed call to brother to inquire about RX. Brother states that prescription was never received by pharmacy. [facility] and confirmed [facility]ente Males.  Placed call to [facility].  Left message to get [facility] asap.  12:25 Placed call back to [facility] and she confirmed rx received and patients brother has picked up forms that patient needs to sign.   12:30 Placed call to brother who confirms that everything is set with prescriptions. Offered home visit for next week and patient and brother have accepted.  PLAN: Will see patient for initial home visit on 03/31/2016. Confirmed address.  Will send MD update.  Tomasa Rand, RN, BSN, CEN [facility] [employer] [phone]

## 2016-03-25 NOTE — Telephone Encounter (Signed)
[facility]ed [facility] with [facility]. Advised the budesonide and duoneb were sent to [place]. She reports they are unable to bill medicare part B and is needing the RX's faxed to them. I have done so. Nothing further needed

## 2016-03-26 ENCOUNTER — Telehealth: Payer: Self-pay | Admitting: Emergency Medicine

## 2016-03-26 DIAGNOSIS — R911 Solitary pulmonary nodule: Secondary | ICD-10-CM

## 2016-03-26 DIAGNOSIS — C3431 Malignant neoplasm of lower lobe, right bronchus or lung: Secondary | ICD-10-CM

## 2016-03-26 DIAGNOSIS — I5042 Chronic combined systolic (congestive) and diastolic (congestive) heart failure: Secondary | ICD-10-CM

## 2016-03-26 NOTE — Telephone Encounter (Signed)
Spoke with Amy with [facility]  She is asking for VO to have social worker come out and see if they can help pt  She also states that pt's family feels that she may need o2 in the home to help with episodes of SOB  I advised that we can have her come in for sooner f/u, or she can keep planned ov with TP 04/07/16 and we can eval for o2 needs then  She will ask family what they want and call for sooner appt if needed  Please advise on social worker order, thanks

## 2016-03-27 ENCOUNTER — Telehealth: Payer: Self-pay | Admitting: Emergency Medicine

## 2016-03-27 NOTE — Telephone Encounter (Signed)
Spoke with [facility] RN and she is at the home now.  She stated that the family and her wanted to clarify the neb tx.  This has been done and nothing further is needed.

## 2016-03-28 ENCOUNTER — Other Ambulatory Visit: Payer: Self-pay | Admitting: Cardiovascular Disease

## 2016-03-31 ENCOUNTER — Other Ambulatory Visit: Payer: Self-pay

## 2016-03-31 ENCOUNTER — Telehealth: Payer: Self-pay

## 2016-03-31 DIAGNOSIS — E440 Moderate protein-calorie malnutrition: Secondary | ICD-10-CM | POA: Diagnosis not present

## 2016-03-31 DIAGNOSIS — I5042 Chronic combined systolic (congestive) and diastolic (congestive) heart failure: Secondary | ICD-10-CM | POA: Diagnosis not present

## 2016-03-31 DIAGNOSIS — Z87891 Personal history of nicotine dependence: Secondary | ICD-10-CM | POA: Diagnosis not present

## 2016-03-31 DIAGNOSIS — E785 Hyperlipidemia, unspecified: Secondary | ICD-10-CM | POA: Diagnosis not present

## 2016-03-31 DIAGNOSIS — C3431 Malignant neoplasm of lower lobe, right bronchus or lung: Secondary | ICD-10-CM | POA: Diagnosis not present

## 2016-03-31 DIAGNOSIS — I2510 Atherosclerotic heart disease of native coronary artery without angina pectoris: Secondary | ICD-10-CM | POA: Diagnosis not present

## 2016-03-31 DIAGNOSIS — I110 Hypertensive heart disease with heart failure: Secondary | ICD-10-CM | POA: Diagnosis not present

## 2016-03-31 DIAGNOSIS — Z7951 Long term (current) use of inhaled steroids: Secondary | ICD-10-CM | POA: Diagnosis not present

## 2016-03-31 DIAGNOSIS — Z7982 Long term (current) use of aspirin: Secondary | ICD-10-CM | POA: Diagnosis not present

## 2016-03-31 DIAGNOSIS — Z7901 Long term (current) use of anticoagulants: Secondary | ICD-10-CM | POA: Diagnosis not present

## 2016-03-31 DIAGNOSIS — J189 Pneumonia, unspecified organism: Secondary | ICD-10-CM | POA: Diagnosis not present

## 2016-03-31 DIAGNOSIS — J441 Chronic obstructive pulmonary disease with (acute) exacerbation: Secondary | ICD-10-CM | POA: Diagnosis not present

## 2016-03-31 DIAGNOSIS — I255 Ischemic cardiomyopathy: Secondary | ICD-10-CM | POA: Diagnosis not present

## 2016-03-31 DIAGNOSIS — J440 Chronic obstructive pulmonary disease with acute lower respiratory infection: Secondary | ICD-10-CM | POA: Diagnosis not present

## 2016-03-31 NOTE — Telephone Encounter (Signed)
Recommend stop bisoprolol until seen tomorrow.

## 2016-03-31 NOTE — Telephone Encounter (Signed)
Spoke to pt and advised per Dr g. Pt verbally expressed understanding

## 2016-03-31 NOTE — Telephone Encounter (Signed)
She likely needs a full home health assessment to determine what her needs are. This will include equipment, nursing, aides, therapy and social work. Please make that referral for me thank you

## 2016-03-31 NOTE — Patient Outreach (Signed)
[facility]) Care Management   03/31/2016  [name] 07/26/1941 [id number]  [name] is an 75 y.o. female 10:30 am Arrived for home visit: Subjective: Patient reports to me that she is doing much better since she got her nebulizer. Reports that her breathing is much better. Reports that she was able to get out of the house over the weekend.  Patient reports that she has anxiety when she gets short of breath.   Patient reports that her niece manages all her medications. Patient reports that she is active with home health nurse and home health physical therapy. Patient reports that she has lost about 25 pounds because of not eating right before her last admission. Reports eating well now and trying to gain some weight. Reports drinking ensure one can per day.  Objective:   Vitals:   03/31/16 1043  BP: 108/60  Pulse: (!) 58  Resp: 20  SpO2: 96%  Weight: 113 lb (51.3 kg)  Height: 1.676 m ('5\' 6"'$ )    Review of Systems  Constitutional: Positive for weight loss.  HENT: Negative.   Eyes: Negative.   Respiratory: Positive for cough and sputum production.        White sputum, Occasional shortness of breath  Cardiovascular: Negative.   Gastrointestinal: Negative.   Genitourinary: Positive for frequency.  Musculoskeletal: Negative.   Skin: Negative.   Neurological: Positive for weakness.  Endo/Heme/Allergies: Bruises/bleeds easily.  Psychiatric/Behavioral: The patient is nervous/anxious.        Reports feeling anxious at times    Physical Exam  Constitutional: She is oriented to person, place, and time.  Thin appearing  Cardiovascular: Normal heart sounds and intact distal pulses.   Low heart rate. Patient reports this is normal for her  Respiratory: Effort normal and breath sounds normal. No respiratory distress. She has no wheezes.  Lungs clear today. Talking in complete sentences. No distress noted  GI: Soft. Bowel sounds are normal.  Musculoskeletal:  Normal range of motion. She exhibits no edema.  Neurological: She is alert and oriented to person, place, and time.  Year 2017, president. Trump,  Day of the week is Tuesday.  Skin: Skin is warm and dry.  Psychiatric: She has a normal mood and affect. Her behavior is normal. Judgment and thought content normal.    Encounter Medications:   Outpatient Encounter Prescriptions as of 03/31/2016  Medication Sig  . albuterol (PROAIR HFA) 108 (90 Base) MCG/ACT inhaler Inhale 2 puffs into the lungs every 4 (four) hours as needed for wheezing or shortness of breath.  Marland Kitchen aspirin EC 81 MG tablet Take 1 tablet (81 mg total) by mouth daily.  . bisoprolol (ZEBETA) 5 MG tablet Take 0.5 tablets (2.5 mg total) by mouth daily.  . budesonide (PULMICORT) 0.5 MG/2ML nebulizer solution Take 2 mLs (0.5 mg total) by nebulization 2 (two) times daily.  . Calcium Carbonate-Vitamin D (CALCIUM-VITAMIN D) 500-200 MG-UNIT per tablet Take 2 tablets by mouth daily.  . Cholecalciferol (VITAMIN D) 2000 UNITS CAPS Take 1 capsule by mouth daily.  . clopidogrel (PLAVIX) 75 MG tablet Take 1 tablet (75 mg total) by mouth daily.  Marland Kitchen denosumab (PROLIA) 60 MG/ML SOLN injection Inject 60 mg into the skin every 6 (six) months. Administer in upper arm, thigh, or abdomen  . furosemide (LASIX) 40 MG tablet TAKE 1 TABLET BY MOUTH DAILY  . ipratropium-albuterol (DUONEB) 0.5-2.5 (3) MG/3ML SOLN Use 4 times a day and every 4 hrs. As needed  . isosorbide mononitrate (  IMDUR) 30 MG 24 hr tablet TAKE 1 TABLET BY MOUTH DAILY  . LOTEMAX 0.5 % ophthalmic suspension Place 1 drop into both eyes daily.   . nitroGLYCERIN (NITROSTAT) 0.4 MG SL tablet Place 1 tablet (0.4 mg total) under the tongue every 5 (five) minutes as needed for chest pain.  . pantoprazole (PROTONIX) 40 MG tablet Take 1 tablet (40 mg total) by mouth daily.  . simvastatin (ZOCOR) 40 MG tablet TAKE ONE TABLET BY MOUTH AT BEDTIME  . spironolactone (ALDACTONE) 25 MG tablet TAKE 1/2  TABLET BY  MOUTH ONCE DAILY.  . fluticasone furoate-vilanterol (BREO ELLIPTA) 100-25 MCG/INH AEPB Inhale 1 puff into the lungs daily. (Patient not taking: Reported on 03/31/2016)  . methylPREDNISolone (MEDROL DOSEPAK) 4 MG TBPK tablet Taper per package instructions. (Patient not taking: Reported on 03/31/2016)  . tiotropium (SPIRIVA HANDIHALER) 18 MCG inhalation capsule Place 1 capsule (18 mcg total) into inhaler and inhale daily. (Patient not taking: Reported on 03/31/2016)   No facility-administered encounter medications on file as of 03/31/2016.     Functional Status:   In your present state of health, do you have any difficulty performing the following activities: 03/31/2016 03/19/2016  Hearing? N N  Vision? N N  Difficulty concentrating or making decisions? Y N  Walking or climbing stairs? N Y  Dressing or bathing? N N  Doing errands, shopping? Y N  Preparing Food and eating ? N -  Using the Toilet? N -  In the past six months, have you accidently leaked urine? N -  Do you have problems with loss of bowel control? N -  Managing your Medications? N -  Managing your Finances? N -  Housekeeping or managing your Housekeeping? N -  Some recent data might be hidden    Fall/Depression Screening:    PHQ 2/9 Scores 03/31/2016 03/09/2016 03/09/2016 01/08/2016 04/08/2015  PHQ - 2 Score 0 0 0 0 0  PHQ- 9 Score - 0 - - -   Fall Risk  03/31/2016 03/09/2016 02/07/2016 01/08/2016 11/14/2015  Falls in the past year? No No No No No  Risk for fall due to : - Medication side effect - - -   Assessment:  (1) reviewed [facility] program and transition of care. Patient able to present to me her welcome packet from hospital. Denies any changes needed to consent form.  Reviewed 24 hour call a nurse line. (2) recent weight loss. (3) reports breathing is improved. Patient with questions about rehab and need for use of incentive spirometer. (4) has all prescribed medications.   Plan:  (1) Provided my contact card. Reviewed with patient  that she would receive weekly telephone calls for follow up and to call sooner if needed. (2) will send nutritional screen to MD. Encouraged patient to eat frequent snacks. (3)Encouraged patient to discuss her concerns with primary MD tomorrow for follow up office visit. (4) Encouraged patient to use medications as prescribed.  Reviewed when to use her nebulizer. Patient voiced understanding.  Care planning and goal setting during home visit. Primary goal is to avoid readmission.  Next telephone follow up in 1 week. This note sent to MD.  [facility] CM Care Plan Problem One   Flowsheet Row Most Recent Value  Care Plan Problem One  Recent admission for pneumonia  Role Documenting the Problem One  Care Management [place] for Problem One  Active  [facility] Long Term Goal (31-90 days)  Patient will verbalize no readmissions to the hospital for COPD  in the next 31 days.  [facility] Long Term Goal Start Date  03/24/16  Interventions for Problem One Long Term Goal  Home visit completed. Reviewed call a nurse line. Reviewed questions for patient to inquire about at next office visit tomorrow.  [facility] CM Short Term Goal #1 (0-30 days)  Patient will verbalize following up with primary Md on 04/01/2016  [facility] CM Short Term Goal #1 Start Date  03/24/16  Interventions for Short Term Goal #1  Reviewed with patient questions to ask during office visiti.  [facility] CM Short Term Goal #2 (0-30 days)  Patient will verbalize having prescribed respiratory medications in the next 7 days.  [facility] CM Short Term Goal #2 Start Date  03/24/16  [facility] CM Short Term Goal #2 Met Date  03/31/16  Interventions for Short Term Goal #2  Phone calls to MD office, pharmacy, encouraged patient to speak with pharmacist  for questions about medications and nebulizer if needed.     Tomasa Rand, RN, BSN, CEN [facility] [employer] [phone]

## 2016-03-31 NOTE — Telephone Encounter (Signed)
[name]; Judeen Hammans saw pt earlier today BP ran from 88/51 - 99/55 and heart rate 45 -55. These are parameters [facility] has to notify physician. Pt has appt for hospital f/u with Dr Darnell Level on 04/01/16 at 9 AM.

## 2016-03-31 NOTE — Telephone Encounter (Signed)
LM for Amy/[facility] nurse

## 2016-04-01 ENCOUNTER — Telehealth: Payer: Self-pay | Admitting: Cardiovascular Disease

## 2016-04-01 ENCOUNTER — Ambulatory Visit (INDEPENDENT_AMBULATORY_CARE_PROVIDER_SITE_OTHER): Payer: PPO | Admitting: Family Medicine

## 2016-04-01 ENCOUNTER — Encounter: Payer: Self-pay | Admitting: Family Medicine

## 2016-04-01 ENCOUNTER — Encounter: Payer: PPO | Attending: Cardiovascular Disease

## 2016-04-01 VITALS — BP 104/60 | HR 47 | Temp 97.5°F | Ht 66.0 in | Wt 114.8 lb

## 2016-04-01 DIAGNOSIS — J431 Panlobular emphysema: Secondary | ICD-10-CM | POA: Diagnosis not present

## 2016-04-01 DIAGNOSIS — E440 Moderate protein-calorie malnutrition: Secondary | ICD-10-CM

## 2016-04-01 DIAGNOSIS — J441 Chronic obstructive pulmonary disease with (acute) exacerbation: Secondary | ICD-10-CM

## 2016-04-01 DIAGNOSIS — N183 Chronic kidney disease, stage 3 unspecified: Secondary | ICD-10-CM

## 2016-04-01 DIAGNOSIS — I2109 ST elevation (STEMI) myocardial infarction involving other coronary artery of anterior wall: Secondary | ICD-10-CM | POA: Insufficient documentation

## 2016-04-01 DIAGNOSIS — I255 Ischemic cardiomyopathy: Secondary | ICD-10-CM

## 2016-04-01 DIAGNOSIS — J189 Pneumonia, unspecified organism: Secondary | ICD-10-CM

## 2016-04-01 DIAGNOSIS — Z23 Encounter for immunization: Secondary | ICD-10-CM

## 2016-04-01 DIAGNOSIS — R001 Bradycardia, unspecified: Secondary | ICD-10-CM

## 2016-04-01 LAB — RENAL FUNCTION PANEL
Albumin: 3.9 g/dL (ref 3.5–5.2)
BUN: 20 mg/dL (ref 6–23)
CALCIUM: 9.4 mg/dL (ref 8.4–10.5)
CHLORIDE: 99 meq/L (ref 96–112)
CO2: 33 meq/L — AB (ref 19–32)
CREATININE: 0.8 mg/dL (ref 0.40–1.20)
GFR: 74.26 mL/min (ref >60.00)
Glucose, Bld: 83 mg/dL (ref 70–99)
PHOSPHORUS: 3.7 mg/dL (ref 2.3–4.6)
Potassium: 3.9 mEq/L (ref 3.5–5.1)
Sodium: 138 mEq/L (ref 135–145)

## 2016-04-01 NOTE — Telephone Encounter (Signed)
Patient is calling about scheduling an appointment, and letting Dr. Burt Knack know that Dr. Danise Mina stopped her Bisoprolol for now. Informed patient that Dr. Antionette Char nurse is out of the office this week, but she will call her when she returns. Will forward to Dr. Burt Knack so he is aware of the medication changes.

## 2016-04-01 NOTE — Assessment & Plan Note (Signed)
Completed levaquin course. Overall improved.

## 2016-04-01 NOTE — Progress Notes (Signed)
BP 104/60   Pulse (!) 47   Temp 97.5 F (36.4 C) (Oral)   Ht '5\' 6"'$  (1.676 m)   Wt 114 lb 12.8 oz (52.1 kg)   SpO2 97%   BMI 18.53 kg/m    CC: hosp f/u visit Subjective:    Patient ID: [name], female    DOB: 02-10-1941, 75 y.o.   MRN: [id number]  HPI: [name] is a 74 y.o. female presenting on 04/01/2016 for Hospitalization Follow-up; COPD; and Pneumonia   Here with SIL Ethyl.  Recent hospitalization for STEMI 02/26/2016. Rpt hospitalization for COPD exacerbation and HCAP 03/18/2016. Completed oral levaquin and prednisone course. Saw pulm in f/u last week.   Did not tolerate symbicort or spiriva. Started on nebulizers - pulmicort 0.'5mg'$  bid, duonebs QID, albuterol inhaler PRN. She is currently taking duoneb PRN. Overall feeling better. Denies fevers, chills, chest pain, palpitations, headaches. Mild dyspnea and cough.   Brings PT notes showing blood pressures from sitting 112/60 to standing 88/51 with dizziness endorsed.   Drinks 2 ensures and 4 glasses of water per day.   F/u with cardiology planned over next 2 weeks Burt Knack).  Has cardiac rehab planned but doesn't feel she can tolerate entire 2 hr session.   No f/u phone call completed.   Relevant past medical, surgical, family and social history reviewed and updated as indicated. Interim medical history since our last visit reviewed. Allergies and medications reviewed and updated. Current Outpatient Prescriptions on File Prior to Visit  Medication Sig  . albuterol (PROAIR HFA) 108 (90 Base) MCG/ACT inhaler Inhale 2 puffs into the lungs every 4 (four) hours as needed for wheezing or shortness of breath.  Marland Kitchen aspirin EC 81 MG tablet Take 1 tablet (81 mg total) by mouth daily.  . bisoprolol (ZEBETA) 5 MG tablet Take 0.5 tablets (2.5 mg total) by mouth daily.  . budesonide (PULMICORT) 0.5 MG/2ML nebulizer solution Take 2 mLs (0.5 mg total) by nebulization 2 (two) times daily.  . Calcium Carbonate-Vitamin D  (CALCIUM-VITAMIN D) 500-200 MG-UNIT per tablet Take 2 tablets by mouth daily.  . Cholecalciferol (VITAMIN D) 2000 UNITS CAPS Take 1 capsule by mouth daily.  . clopidogrel (PLAVIX) 75 MG tablet Take 1 tablet (75 mg total) by mouth daily.  Marland Kitchen denosumab (PROLIA) 60 MG/ML SOLN injection Inject 60 mg into the skin every 6 (six) months. Administer in upper arm, thigh, or abdomen  . ipratropium-albuterol (DUONEB) 0.5-2.5 (3) MG/3ML SOLN Use 4 times a day and every 4 hrs. As needed  . isosorbide mononitrate (IMDUR) 30 MG 24 hr tablet TAKE 1 TABLET BY MOUTH DAILY  . LOTEMAX 0.5 % ophthalmic suspension Place 1 drop into both eyes daily.   . nitroGLYCERIN (NITROSTAT) 0.4 MG SL tablet Place 1 tablet (0.4 mg total) under the tongue every 5 (five) minutes as needed for chest pain.  . pantoprazole (PROTONIX) 40 MG tablet Take 1 tablet (40 mg total) by mouth daily.  . simvastatin (ZOCOR) 40 MG tablet TAKE ONE TABLET BY MOUTH AT BEDTIME  . spironolactone (ALDACTONE) 25 MG tablet TAKE 1/2  TABLET BY MOUTH ONCE DAILY.   No current facility-administered medications on file prior to visit.     Review of Systems Per HPI unless specifically indicated in ROS section     Objective:    BP 104/60   Pulse (!) 47   Temp 97.5 F (36.4 C) (Oral)   Ht '5\' 6"'$  (1.676 m)   Wt 114 lb 12.8 oz (52.1  kg)   SpO2 97%   BMI 18.53 kg/m   Wt Readings from Last 3 Encounters:  04/01/16 114 lb 12.8 oz (52.1 kg)  03/31/16 113 lb (51.3 kg)  03/23/16 116 lb 12.8 oz (53 kg)    Physical Exam  Constitutional: She appears well-developed and well-nourished. No distress.  HENT:  Mouth/Throat: Oropharynx is clear and moist. No oropharyngeal exudate.  Eyes: Conjunctivae are normal. Pupils are equal, round, and reactive to light.  Cardiovascular: Normal rate, regular rhythm, normal heart sounds and intact distal pulses.   No murmur heard. Pulmonary/Chest: Effort normal and breath sounds normal. No respiratory distress. She has no  wheezes. She has no rales.  Musculoskeletal: She exhibits no edema.  Skin: Skin is warm and dry. No rash noted.  Psychiatric: She has a normal mood and affect.  Nursing note and vitals reviewed.     Assessment & Plan:   Problem List Items Addressed This Visit    CKD (chronic kidney disease) stage 3, GFR 30-59 ml/min   Relevant Orders   Renal Function Panel (Completed)   COPD (chronic obstructive pulmonary disease) ([place])    Tolerating and responding to nebulized treatments better. Continue pulmicort BID, duonebs.       COPD exacerbation ([place])    Completed levaquin/prednisone, overall improved.       HCAP (healthcare-associated pneumonia)    Completed levaquin course. Overall improved.       Ischemic cardiomyopathy    Not tolerating beta blocker due to hypotension - will hold for now.       Relevant Medications   furosemide (LASIX) 40 MG tablet   Malnutrition of moderate degree    Weight loss over last few months due to recent illnesses (STEMI then COPD exac/HCAP). Appetite improving, drinking 2 ensure daily. Check albumin today.       Symptomatic bradycardia - Primary    Carvedilol changed to bisoprolol for more selective B blockade, on low dose bisoprolol however ongoing hypotension and bradycardia. Will hold bisoprolol for now, decrease lasix to '20mg'$  daily. Will cc cardiology to be aware of change.       Other Visit Diagnoses    Need for influenza vaccination       Relevant Orders   Flu Vaccine QUAD 36+ mos IM (Completed)       Follow up plan: Return in about 3 months (around 07/01/2016) for follow up visit.  Ria Bush, MD

## 2016-04-01 NOTE — Telephone Encounter (Signed)
lmtcb X2 for Amy with [facility]

## 2016-04-01 NOTE — Assessment & Plan Note (Signed)
Weight loss over last few months due to recent illnesses (STEMI then COPD exac/HCAP). Appetite improving, drinking 2 ensure daily. Check albumin today.

## 2016-04-01 NOTE — Patient Instructions (Addendum)
Flu shot today. Labs today. Continue to hold bisoprolol for now, until you see Dr Copper. Decrease lasix to '20mg'$  once daily (1/2 tablet). Continue spironolactone 1/2 tablet and imdur '30mg'$  daily as up to now.  I'm glad you're doing well today.  Make sure you continue to stay well hydrated.

## 2016-04-01 NOTE — Assessment & Plan Note (Signed)
Tolerating and responding to nebulized treatments better. Continue pulmicort BID, duonebs.

## 2016-04-01 NOTE — Assessment & Plan Note (Signed)
Carvedilol changed to bisoprolol for more selective B blockade, on low dose bisoprolol however ongoing hypotension and bradycardia. Will hold bisoprolol for now, decrease lasix to '20mg'$  daily. Will cc cardiology to be aware of change.

## 2016-04-01 NOTE — Telephone Encounter (Signed)
thx

## 2016-04-01 NOTE — Progress Notes (Signed)
Pre visit review using our clinic review tool, if applicable. No additional management support is needed unless otherwise documented below in the visit note. 

## 2016-04-01 NOTE — Assessment & Plan Note (Signed)
Not tolerating beta blocker due to hypotension - will hold for now.

## 2016-04-01 NOTE — Telephone Encounter (Signed)
Follow Up:    She said that Lauren was working on her getting an appointment with Dr Burt Knack. She needs a medicine that only Dr Copper can put her back on.

## 2016-04-01 NOTE — Assessment & Plan Note (Signed)
Completed levaquin/prednisone, overall improved.

## 2016-04-02 ENCOUNTER — Telehealth: Payer: Self-pay

## 2016-04-02 NOTE — Telephone Encounter (Signed)
[name] called 9/6 and stated she is at home now but is still weak and cant attend [facility] this week but plans to return.

## 2016-04-02 NOTE — Telephone Encounter (Signed)
Called and spoke with Amy from [facility]---she stated that the pt is currently receiving the nurse aide and nursing care, all they needed is an order for the social worker.  This has been placed and nothing further is needed.

## 2016-04-05 ENCOUNTER — Observation Stay (HOSPITAL_COMMUNITY)
Admission: EM | Admit: 2016-04-05 | Discharge: 2016-04-06 | Disposition: A | Discharge status: Home / Self Care | Payer: PPO | Attending: Internal Medicine | Admitting: Internal Medicine

## 2016-04-05 ENCOUNTER — Encounter (HOSPITAL_COMMUNITY): Payer: Self-pay | Admitting: Emergency Medicine

## 2016-04-05 ENCOUNTER — Other Ambulatory Visit: Payer: Self-pay

## 2016-04-05 ENCOUNTER — Emergency Department (HOSPITAL_COMMUNITY): Payer: PPO

## 2016-04-05 DIAGNOSIS — I472 Ventricular tachycardia: Secondary | ICD-10-CM | POA: Insufficient documentation

## 2016-04-05 DIAGNOSIS — R05 Cough: Secondary | ICD-10-CM | POA: Diagnosis not present

## 2016-04-05 DIAGNOSIS — Z7983 Long term (current) use of bisphosphonates: Secondary | ICD-10-CM | POA: Diagnosis not present

## 2016-04-05 DIAGNOSIS — I252 Old myocardial infarction: Secondary | ICD-10-CM | POA: Insufficient documentation

## 2016-04-05 DIAGNOSIS — I2510 Atherosclerotic heart disease of native coronary artery without angina pectoris: Secondary | ICD-10-CM | POA: Diagnosis present

## 2016-04-05 DIAGNOSIS — E559 Vitamin D deficiency, unspecified: Secondary | ICD-10-CM | POA: Diagnosis not present

## 2016-04-05 DIAGNOSIS — R079 Chest pain, unspecified: Secondary | ICD-10-CM | POA: Diagnosis not present

## 2016-04-05 DIAGNOSIS — Z955 Presence of coronary angioplasty implant and graft: Secondary | ICD-10-CM | POA: Insufficient documentation

## 2016-04-05 DIAGNOSIS — Z8673 Personal history of transient ischemic attack (TIA), and cerebral infarction without residual deficits: Secondary | ICD-10-CM | POA: Diagnosis not present

## 2016-04-05 DIAGNOSIS — K219 Gastro-esophageal reflux disease without esophagitis: Secondary | ICD-10-CM | POA: Diagnosis not present

## 2016-04-05 DIAGNOSIS — J441 Chronic obstructive pulmonary disease with (acute) exacerbation: Secondary | ICD-10-CM | POA: Diagnosis present

## 2016-04-05 DIAGNOSIS — E785 Hyperlipidemia, unspecified: Secondary | ICD-10-CM | POA: Diagnosis not present

## 2016-04-05 DIAGNOSIS — I130 Hypertensive heart and chronic kidney disease with heart failure and stage 1 through stage 4 chronic kidney disease, or unspecified chronic kidney disease: Secondary | ICD-10-CM | POA: Diagnosis not present

## 2016-04-05 DIAGNOSIS — R0789 Other chest pain: Secondary | ICD-10-CM | POA: Diagnosis not present

## 2016-04-05 DIAGNOSIS — Z79899 Other long term (current) drug therapy: Secondary | ICD-10-CM | POA: Insufficient documentation

## 2016-04-05 DIAGNOSIS — E440 Moderate protein-calorie malnutrition: Secondary | ICD-10-CM | POA: Insufficient documentation

## 2016-04-05 DIAGNOSIS — H3530 Unspecified macular degeneration: Secondary | ICD-10-CM | POA: Diagnosis not present

## 2016-04-05 DIAGNOSIS — Z7951 Long term (current) use of inhaled steroids: Secondary | ICD-10-CM | POA: Insufficient documentation

## 2016-04-05 DIAGNOSIS — I5042 Chronic combined systolic (congestive) and diastolic (congestive) heart failure: Secondary | ICD-10-CM | POA: Insufficient documentation

## 2016-04-05 DIAGNOSIS — Z7902 Long term (current) use of antithrombotics/antiplatelets: Secondary | ICD-10-CM | POA: Diagnosis not present

## 2016-04-05 DIAGNOSIS — J449 Chronic obstructive pulmonary disease, unspecified: Secondary | ICD-10-CM | POA: Insufficient documentation

## 2016-04-05 DIAGNOSIS — Z681 Body mass index (BMI) 19 or less, adult: Secondary | ICD-10-CM | POA: Diagnosis not present

## 2016-04-05 DIAGNOSIS — J431 Panlobular emphysema: Secondary | ICD-10-CM

## 2016-04-05 DIAGNOSIS — Z85118 Personal history of other malignant neoplasm of bronchus and lung: Secondary | ICD-10-CM | POA: Insufficient documentation

## 2016-04-05 DIAGNOSIS — I255 Ischemic cardiomyopathy: Secondary | ICD-10-CM | POA: Diagnosis not present

## 2016-04-05 DIAGNOSIS — N183 Chronic kidney disease, stage 3 (moderate): Secondary | ICD-10-CM | POA: Insufficient documentation

## 2016-04-05 DIAGNOSIS — M810 Age-related osteoporosis without current pathological fracture: Secondary | ICD-10-CM | POA: Diagnosis present

## 2016-04-05 DIAGNOSIS — Z87891 Personal history of nicotine dependence: Secondary | ICD-10-CM | POA: Insufficient documentation

## 2016-04-05 DIAGNOSIS — I10 Essential (primary) hypertension: Secondary | ICD-10-CM | POA: Diagnosis present

## 2016-04-05 DIAGNOSIS — I5043 Acute on chronic combined systolic (congestive) and diastolic (congestive) heart failure: Secondary | ICD-10-CM | POA: Diagnosis present

## 2016-04-05 DIAGNOSIS — J189 Pneumonia, unspecified organism: Secondary | ICD-10-CM | POA: Diagnosis present

## 2016-04-05 DIAGNOSIS — R0602 Shortness of breath: Secondary | ICD-10-CM | POA: Diagnosis not present

## 2016-04-05 DIAGNOSIS — Z7982 Long term (current) use of aspirin: Secondary | ICD-10-CM | POA: Insufficient documentation

## 2016-04-05 DIAGNOSIS — C3431 Malignant neoplasm of lower lobe, right bronchus or lung: Secondary | ICD-10-CM | POA: Diagnosis present

## 2016-04-05 DIAGNOSIS — M1990 Unspecified osteoarthritis, unspecified site: Secondary | ICD-10-CM | POA: Diagnosis present

## 2016-04-05 LAB — BASIC METABOLIC PANEL
ANION GAP: 8 (ref 5–15)
BUN: 19 mg/dL (ref 6–20)
CO2: 25 mmol/L (ref 22–32)
Calcium: 9.2 mg/dL (ref 8.9–10.3)
Chloride: 107 mmol/L (ref 101–111)
Creatinine, Ser: 0.7 mg/dL (ref 0.44–1.00)
Glucose, Bld: 98 mg/dL (ref 65–99)
POTASSIUM: 4.1 mmol/L (ref 3.5–5.1)
SODIUM: 140 mmol/L (ref 135–145)

## 2016-04-05 LAB — CBC
HCT: 36.5 % (ref 36.0–46.0)
HEMOGLOBIN: 11.9 g/dL — AB (ref 12.0–15.0)
MCH: 31.1 pg (ref 26.0–34.0)
MCHC: 32.6 g/dL (ref 30.0–36.0)
MCV: 95.3 fL (ref 78.0–100.0)
PLATELETS: 216 10*3/uL (ref 150–400)
RBC: 3.83 MIL/uL — AB (ref 3.87–5.11)
RDW: 15.2 % (ref 11.5–15.5)
WBC: 6.2 10*3/uL (ref 4.0–10.5)

## 2016-04-05 LAB — TROPONIN I: Troponin I: <0.03 ng/mL (ref <0.03)

## 2016-04-05 LAB — I-STAT TROPONIN, ED
TROPONIN I, POC: 0.01 ng/mL (ref 0.00–0.08)
TROPONIN I, POC: 0.02 ng/mL (ref 0.00–0.08)

## 2016-04-05 MED ORDER — KETOROLAC TROMETHAMINE 15 MG/ML IJ SOLN
15.0000 mg | Freq: Four times a day (QID) | INTRAMUSCULAR | Status: DC | PRN
  Filled 2016-04-05: qty 1

## 2016-04-05 MED ORDER — BISACODYL 10 MG RE SUPP: 10.0000 mg | Freq: Every day | RECTAL | Status: DC | PRN

## 2016-04-05 MED ORDER — NITROGLYCERIN 0.4 MG SL SUBL: 0.4000 mg | SUBLINGUAL_TABLET | SUBLINGUAL | Status: DC | PRN

## 2016-04-05 MED ORDER — ENOXAPARIN SODIUM 40 MG/0.4ML ~~LOC~~ SOLN
40.0000 mg | SUBCUTANEOUS | Status: DC
  Administered 2016-04-05 – 2016-04-06 (×2): 40 mg via SUBCUTANEOUS
  Filled 2016-04-05 (×2): qty 0.4

## 2016-04-05 MED ORDER — PANTOPRAZOLE SODIUM 40 MG PO TBEC
40.0000 mg | DELAYED_RELEASE_TABLET | Freq: Every day | ORAL | Status: DC
  Administered 2016-04-05 – 2016-04-06 (×2): 40 mg via ORAL
  Filled 2016-04-05 (×2): qty 1

## 2016-04-05 MED ORDER — SODIUM CHLORIDE 0.9 % IV SOLN: INTRAVENOUS | Status: DC

## 2016-04-05 MED ORDER — SODIUM CHLORIDE 0.9% FLUSH
3.0000 mL | Freq: Two times a day (BID) | INTRAVENOUS | Status: DC
  Administered 2016-04-05 – 2016-04-06 (×3): 3 mL via INTRAVENOUS

## 2016-04-05 MED ORDER — ISOSORBIDE MONONITRATE ER 30 MG PO TB24
30.0000 mg | ORAL_TABLET | Freq: Every day | ORAL | Status: DC
  Administered 2016-04-05 – 2016-04-06 (×2): 30 mg via ORAL
  Filled 2016-04-05 (×2): qty 1

## 2016-04-05 MED ORDER — SENNOSIDES-DOCUSATE SODIUM 8.6-50 MG PO TABS
1.0000 | ORAL_TABLET | Freq: Every evening | ORAL | Status: DC | PRN
  Administered 2016-04-05: 1 via ORAL
  Filled 2016-04-05: qty 1

## 2016-04-05 MED ORDER — SPIRONOLACTONE 25 MG PO TABS
12.5000 mg | ORAL_TABLET | Freq: Every day | ORAL | Status: DC
  Administered 2016-04-05 – 2016-04-06 (×2): 12.5 mg via ORAL
  Filled 2016-04-05 (×2): qty 1

## 2016-04-05 MED ORDER — FUROSEMIDE 20 MG PO TABS
20.0000 mg | ORAL_TABLET | Freq: Every day | ORAL | Status: DC
Start: 2016-04-05 — End: 2016-04-06
  Administered 2016-04-05 – 2016-04-06 (×2): 20 mg via ORAL
  Filled 2016-04-05 (×2): qty 1

## 2016-04-05 MED ORDER — LOTEPREDNOL ETABONATE 0.5 % OP SUSP
1.0000 [drp] | Freq: Every day | OPHTHALMIC | Status: DC
  Administered 2016-04-05 – 2016-04-06 (×2): 1 [drp] via OPHTHALMIC
  Filled 2016-04-05: qty 5

## 2016-04-05 MED ORDER — BISOPROLOL FUMARATE 5 MG PO TABS
2.5000 mg | ORAL_TABLET | Freq: Every day | ORAL | Status: DC
  Administered 2016-04-05 – 2016-04-06 (×2): 2.5 mg via ORAL
  Filled 2016-04-05 (×2): qty 1

## 2016-04-05 MED ORDER — ASPIRIN 81 MG PO CHEW: 324.0000 mg | CHEWABLE_TABLET | Freq: Once | ORAL | Status: DC

## 2016-04-05 MED ORDER — ONDANSETRON HCL 4 MG/2ML IJ SOLN: 4.0000 mg | Freq: Four times a day (QID) | INTRAMUSCULAR | Status: DC | PRN

## 2016-04-05 MED ORDER — IPRATROPIUM-ALBUTEROL 0.5-2.5 (3) MG/3ML IN SOLN: 3.0000 mL | RESPIRATORY_TRACT | Status: DC | PRN

## 2016-04-05 MED ORDER — ASPIRIN EC 81 MG PO TBEC
81.0000 mg | DELAYED_RELEASE_TABLET | Freq: Every day | ORAL | Status: DC
Start: 2016-04-05 — End: 2016-04-06
  Administered 2016-04-05 – 2016-04-06 (×2): 81 mg via ORAL
  Filled 2016-04-05 (×2): qty 1

## 2016-04-05 MED ORDER — BUDESONIDE 0.5 MG/2ML IN SUSP
0.5000 mg | Freq: Two times a day (BID) | RESPIRATORY_TRACT | Status: DC
  Administered 2016-04-05 – 2016-04-06 (×3): 0.5 mg via RESPIRATORY_TRACT
  Filled 2016-04-05 (×3): qty 2

## 2016-04-05 MED ORDER — MAGNESIUM CITRATE PO SOLN: 1.0000 | Freq: Once | ORAL | Status: DC | PRN

## 2016-04-05 MED ORDER — ACETAMINOPHEN 325 MG PO TABS: 650.0000 mg | ORAL_TABLET | ORAL | Status: DC | PRN

## 2016-04-05 MED ORDER — CLOPIDOGREL BISULFATE 75 MG PO TABS
75.0000 mg | ORAL_TABLET | Freq: Every day | ORAL | Status: DC
  Administered 2016-04-05 – 2016-04-06 (×2): 75 mg via ORAL
  Filled 2016-04-05 (×2): qty 1

## 2016-04-05 MED ORDER — ENSURE ENLIVE PO LIQD
237.0000 mL | Freq: Two times a day (BID) | ORAL | Status: DC
  Administered 2016-04-05 – 2016-04-06 (×3): 237 mL via ORAL

## 2016-04-05 MED ORDER — TRAZODONE HCL 50 MG PO TABS: 25.0000 mg | ORAL_TABLET | Freq: Every evening | ORAL | Status: DC | PRN

## 2016-04-05 MED ORDER — GUAIFENESIN ER 600 MG PO TB12
600.0000 mg | ORAL_TABLET | Freq: Two times a day (BID) | ORAL | Status: DC
  Administered 2016-04-05 – 2016-04-06 (×3): 600 mg via ORAL
  Filled 2016-04-05 (×3): qty 1

## 2016-04-05 MED ORDER — SIMVASTATIN 40 MG PO TABS
40.0000 mg | ORAL_TABLET | Freq: Every day | ORAL | Status: DC
  Administered 2016-04-05: 40 mg via ORAL
  Filled 2016-04-05: qty 1

## 2016-04-05 NOTE — ED Notes (Signed)
Attempted Report 

## 2016-04-05 NOTE — H&P (Signed)
History and Physical    [name] GUR:[id number] DOB: [birth year] DOA: 04/05/2016   PCP: Ria Bush, MD   Patient coming from:  Home  Chief Complaint: Chest pain    HPI: [name] is a 75 y.o. 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 Julien Nordmann), GERD, CAD status post M I in 2010,status post stent, with last catheterization on 02/24/2016,with 65% prox LAD and 1st diagonal,and 100 % distal LAD, ischemic cardiomyopathy with ejection fraction 40-45%,and recent admission for COPD exacerbation and possible  pneumonia on 03/19/16, discharged on Levaquin, having finished her course of antibiotics about 3 days ago, presenting to the emergency department with acute onset of chest pain since last evening, a combining by left arm pain, nausea and diaphoresis. The patient has chronic shortness of breath, but these symptoms were not exacerbated. She controls them with nebulizers. She has nonproductive cough which she reports being present since last admission. The patient denies any fever chills or night sweats. The patient to call for nitroglycerin remains and asked baby aspirin, without significant relief. Will admit for observation.    ED Course:  BP 121/72   Pulse (!) 56   Temp 98 F (36.7 C) (Oral)   Resp 20   Ht '5\' 6"'$  (1.676 m)   Wt 51.3 kg (113 lb)   SpO2 95%   BMI 18.24 kg/m    Trop neg to date WBC 6.2 Hb 11.9 with nl platelets 216 CMP normal Cr 0.7 Glu 98 CXR Minimal right basilar opacity may reflect mild pneumonia,( appears resolving per evaluation by Dr. Daleen Bo). Findings of COPD  EKG showed T wave inversion in V5-V6, similar to prior EKG on 03/20/2016. Tn negative 0.01. Will monitor trend  Review of Systems: As per HPI otherwise 10 point review of systems negative.   Past Medical History:  Diagnosis Date  . Arthritis   . CAD (coronary artery  disease) 03/2009   s/p MI  . Chronic combined systolic and diastolic heart failure ([place]) 06/17/2009   Qualifier: Diagnosis of  By: Burt Knack, MD, Clayburn Pert   . CKD (chronic kidney disease) stage 3, GFR 30-59 ml/min   . Emphysema/COPD ([place])   . Ex-smoker quit 2008  . GERD (gastroesophageal reflux disease)   . Helicobacter pylori gastritis 10/2007   treated  . History of CVA (cerebrovascular accident) without residual deficits 01/2012, 06/2012   R hemorrhagic MCA 01/2012 with remote lacunar infarct L putamen and IC, rpt 06/2012 acute multifocal R MCA infarct with remote hemorrhagic strokes affecting L basal ganglia and periventricular white matter, full recovery  . HLD (hyperlipidemia)   . HTN (hypertension)   . Ischemic cardiomyopathy 02/27/2014  . Lung cancer ([place]) dx'd 07/2006   Lung CA, s/p resection, followed by Dr Earlie Server  . Macular degeneration   . Myocardial infarction [facility]) 03/2009   Acute myocardial infarction 2010 - treated with BMS of LCx. LVEF 50% with subsequent CHF  . Osteoporosis 09/2013   T -3.6 forearm 09/2013, T -4.5 forearm 04/2015  . Stroke [facility]     Past Surgical History:  Procedure Laterality Date  . BREAST BIOPSY Left   . CARDIAC CATHETERIZATION N/A 02/24/2016   Procedure: Left Heart Cath and Coronary Angiography;  Surgeon: Belva Crome, MD;  Location: [place];  Service: Cardiovascular;  Laterality: N/A;  . CATARACT EXTRACTION Bilateral   . CORONARY STENT  PLACEMENT    . EYE SURGERY    . LUNG REMOVAL, PARTIAL Right 2008  . PARTIAL HYSTERECTOMY  1986   irregular periods, ovaries remain    Social History Social History   Social History  . Marital status: Widowed    Spouse name: N/A  . Number of children: 1  . Years of education: N/A   Occupational History  . retired     Insurance underwriter   Social History Main Topics  . Smoking status: Former Smoker    Types: Cigarettes  . Smokeless tobacco: Never Used  . Alcohol use No  . Drug use: No  . Sexual  activity: No   Other Topics Concern  . Not on file   Social History Narrative   The patient is a widow and has one son and one dog.     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  She does not drink alcohol. 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   Ed: HS   Activity: no regular exercise   Diet: some water, some fruits/vegetables    Lives in a one story home.      Allergies  Allergen Reactions  . Actonel [Risedronate Sodium] Other (See Comments)    Headache  . Amlodipine Other (See Comments)    Pedal edema  . Fosamax [Alendronate Sodium] Other (See Comments)    Unable to tolerate  . Lisinopril Cough  . Pravastatin Other (See Comments)    Constipation.  . Codeine Rash    Family History  Problem Relation Age of Onset  . CAD Mother 6    MI  . Stroke Mother   . Hypertension Mother   . Heart attack Mother   . CAD Father 8  . Stroke Father   . Breast cancer Sister   . Diabetes Sister       Prior to Admission medications   Medication Sig Start Date End Date Taking? Authorizing Provider  albuterol (PROAIR HFA) 108 (90 Base) MCG/ACT inhaler Inhale 2 puffs into the lungs every 4 (four) hours as needed for wheezing or shortness of breath. 03/24/16   Jose Shirl Harris, MD  aspirin EC 81 MG tablet Take 1 tablet (81 mg total) by mouth daily. 02/26/16   Arbutus Leas, NP  bisoprolol (ZEBETA) 5 MG tablet Take 0.5 tablets (2.5 mg total) by mouth daily. 03/13/16   Brittainy M Simmons, PA-C  budesonide (PULMICORT) 0.5 MG/2ML nebulizer solution Take 2 mLs (0.5 mg total) by nebulization 2 (two) times daily. 03/25/16   Collene Gobble, MD  Calcium Carbonate-Vitamin D (CALCIUM-VITAMIN D) 500-200 MG-UNIT per tablet Take 2 tablets by mouth daily.    Historical Provider, MD  Cholecalciferol (VITAMIN D) 2000 UNITS CAPS Take 1 capsule by mouth daily.    Historical Provider, MD  clopidogrel (PLAVIX) 75 MG tablet Take 1 tablet (75 mg  total) by mouth daily. 02/26/16   Arbutus Leas, NP  denosumab (PROLIA) 60 MG/ML SOLN injection Inject 60 mg into the skin every 6 (six) months. Administer in upper arm, thigh, or abdomen 06/13/15   Ria Bush, MD  furosemide (LASIX) 40 MG tablet Take 0.5 tablets (20 mg total) by mouth daily. 04/01/16   Ria Bush, MD  ipratropium-albuterol (DUONEB) 0.5-2.5 (3) MG/3ML SOLN Use 4 times a day and every 4 hrs. As needed 03/25/16   Collene Gobble, MD  isosorbide mononitrate (IMDUR) 30 MG  24 hr tablet TAKE 1 TABLET BY MOUTH DAILY 03/31/16   Sherren Mocha, MD  LOTEMAX 0.5 % ophthalmic suspension Place 1 drop into both eyes daily.  12/27/13   Historical Provider, MD  nitroGLYCERIN (NITROSTAT) 0.4 MG SL tablet Place 1 tablet (0.4 mg total) under the tongue every 5 (five) minutes as needed for chest pain. 07/03/15   Liliane Shi, PA-C  pantoprazole (PROTONIX) 40 MG tablet Take 1 tablet (40 mg total) by mouth daily. 02/27/16   Sherren Mocha, MD  simvastatin (ZOCOR) 40 MG tablet TAKE ONE TABLET BY MOUTH AT BEDTIME 05/14/15   Sherren Mocha, MD  spironolactone (ALDACTONE) 25 MG tablet TAKE 1/2  TABLET BY MOUTH ONCE DAILY. 03/20/15   Sherren Mocha, MD    Physical Exam:    Vitals:   04/05/16 0715 04/05/16 0730 04/05/16 0745 04/05/16 0800  BP: 123/55 132/77 137/92 121/72  Pulse: (!) 56 62 (!) 55 (!) 56  Resp: '22 22 20 20  '$ Temp:      TempSrc:      SpO2: 95% 95% 93% 95%  Weight:      Height:           Constitutional: NAD, calm, comfortable, chronically il appearing   Vitals:   04/05/16 0715 04/05/16 0730 04/05/16 0745 04/05/16 0800  BP: 123/55 132/77 137/92 121/72  Pulse: (!) 56 62 (!) 55 (!) 56  Resp: '22 22 20 20  '$ Temp:      TempSrc:      SpO2: 95% 95% 93% 95%  Weight:      Height:       Eyes: PERRL, lids and conjunctivae normal ENMT: Mucous membranes are moist. Posterior pharynx clear of any exudate or lesions.Normal dentition.  Neck: normal, supple, no masses, no  thyromegaly Respiratory: decreased breath sounds at the right base, no wheezing, no crackles. Normal respiratory effort. No accessory muscle use.  Cardiovascular: Regular rate and rhythm, no murmurs / rubs / gallops. No extremity edema. 2+ pedal pulses. No carotid bruits.  Abdomen: no tenderness, no masses palpated. No hepatosplenomegaly. Bowel sounds positive.  Musculoskeletal: no clubbing / cyanosis. No joint deformity upper and lower extremities. Good ROM, no contractures. Normal muscle tone.  Skin: no rashes, lesions, ulcers.  Neurologic: CN 2-12 grossly intact. Sensation intact, DTR normal. Strength 5/5 in all 4.    Labs on Admission: I have personally reviewed following labs and imaging studies  CBC:  Recent Labs Lab 04/05/16 0520  WBC 6.2  HGB 11.9*  HCT 36.5  MCV 95.3  PLT 158    Basic Metabolic Panel:  Recent Labs Lab 04/01/16 1006 04/05/16 0520  NA 138 140  K 3.9 4.1  CL 99 107  CO2 33* 25  GLUCOSE 83 98  BUN 20 19  CREATININE 0.80 0.70  CALCIUM 9.4 9.2  PHOS 3.7  --     GFR: Estimated Creatinine Clearance: 49.2 mL/min (by C-G formula based on SCr of 0.8 mg/dL).  Liver Function Tests:  Recent Labs Lab 04/01/16 1006  ALBUMIN 3.9     Sepsis Labs: '@LABRCNTIP'$ (procalcitonin:4,lacticidven:4) )No results found for this or any previous visit (from the past 240 hour(s)).   Radiological Exams on Admission: Dg Chest 2 View  Result Date: 04/05/2016 CLINICAL DATA:  Acute onset of generalized chest pain and nausea. Shortness of breath. Cough and congestion. Initial encounter. EXAM: CHEST  2 VIEW COMPARISON:  Chest radiograph performed 03/23/2016 FINDINGS: The lungs are hyperexpanded, with flattening of the hemidiaphragms, compatible with COPD. Minimal right basilar  opacity may reflect mild pneumonia. There is no evidence of pleural effusion or pneumothorax. Nodular scarring is again noted at the left lung apex. The heart is normal in size; the mediastinal  contour is within normal limits. No acute osseous abnormalities are seen. IMPRESSION: Minimal right basilar opacity may reflect mild pneumonia. Findings of COPD. Electronically Signed   By: Garald Balding M.D.   On: 04/05/2016 06:07    EKG: Independently reviewed.  Assessment/Plan Active Problems:   Chest pain with moderate risk for cardiac etiology   HLD (hyperlipidemia)   Macular degeneration (senile) of retina   Essential hypertension   CAD, NATIVE VESSEL   Chronic combined systolic and diastolic heart failure (HCC)   GERD   Osteoarthritis   Cancer of lower lobe of right lung (HCC)   TIA (transient ischemic attack)   Ischemic cardiomyopathy   Osteoporosis   History of CVA (cerebrovascular accident) without residual deficits   COPD (chronic obstructive pulmonary disease) (H[facility])   HCAP (healthcare-associated pneumonia)   Chest pain  Chest pain syndrome/known CAD status post M I in 2010,status post stent  HEART score 5 . Troponin neg EKG showed T wave inversion in V5-V6, similar to prior EKG on 03/20/2016 CP mildly relieved by nitroglycerin, aspirin. Last cardiac cath on 7/31 65% prox LAD and 1st diagonal,and 100 % distal LAD, ischemic cardiomyopathy with ejection fraction 40-45% 2 D echo  8/25 with mild to mod reduced syst function, severe HK inferolateral myocardium, Gr2 DD. Admit to Telemetry/ Observation Chest pain order set Cycle troponins EKG in am continue ASA, O2 and NTG as needed Continue preadmission beta blocker and nitrate Statins  Cards has been phone consulted, recommending obs for now, and to call if Tn increase  Chronic combined systolic and diastolic CHF. P 2 D echo  8/25 with mild to mod reduced syst function, severe HK inferolateral myocardium, Gr2 DD. Appears compensated Continue home meds -monitor I/Os -daily weights -prn 02   Hypertension BP 137/92   Pulse (!) 55   Controlled Continue home anti-hypertensive medications   Chronic kidney disease stage  3, although currently EGFR 59, Cr 0.7  Lab Results  Component Value Date   CREATININE 0.70 04/05/2016   CREATININE 0.80 04/01/2016   CREATININE 0.90 03/20/2016  Continue to monitor  Repeat CMET in am   Hyperlipidemia Continue home statins   COPD without exacerbation in the setting of recent HCAP. recent admission for COPD exacerbation and possible pneumonia on 03/19/16 CXR with likely resolving infiltrates. Patient afebrile and WBC normal . Finished course of Levaquin several days ago.  Continue nebulizers as needed - Mucinex - O2 - CBC in am  GERD, no acute symptoms: Continue PPI  Macular degeneration Continue home drops    DVT prophylaxis: Lovenox Code Status:   Full     Family Communication:  Discussed with patient and her son Disposition Plan: Expect patient to be discharged to home after condition improves Consults called:   None  Admission status:Tele  Obs      Dewarren Ledbetter E, PA-C [facility]   If 7PM-7AM, please contact night-coverage [email or web address] Password [email or web address]  04/05/2016, 8:13 AM

## 2016-04-05 NOTE — ED Notes (Signed)
Patient transported to X-ray 

## 2016-04-05 NOTE — ED Triage Notes (Signed)
Pt. woke up this morning with central chest pain and left arm pain with SOB , nausea and diaphoresis , pt. took 4 NTG sl and ASA 324 mg prior to arrival . Her cardiologist is Dr. Burt Knack .

## 2016-04-05 NOTE — Progress Notes (Signed)
This is a no charge note  Pending admission per Dr. Tyrone Nine:  75 year old lady with past medical history of hypertension, hyperlipidemia, CAD, myocardial infarction, stroke, lung cancer, combined systolic and diastolic congestive heart failure with EF of 40-45 percent, who presents with chest pain, shortness of breath, left arm pain, also nausea and diaphoresis. Troponin negative. EKG showed T-wave inversion in V5-V6 which is similar to previous EKG on 03/20/16. Patient just had cardiac cath on 7/31, results as below. EDP dicussed with card, Dr. Toni Arthurs who recommended observation and tread troponin. Need to call back card if needed. Pt is accepted to tele for obs  L cardiac cath on 7/31 by Dr. Tamala Julian  Prox LAD lesion, 65 %stenosed.  Ost 1st Diag lesion, 65 %stenosed.  Prox Cx to Mid Cx lesion, 10 %stenosed.  The left ventricular ejection fraction is 35-45% by visual estimate.  LV end diastolic pressure is normal.  There is moderate left ventricular systolic dysfunction.  Dist LAD lesion, 100 %stenosed.   Ivor Costa, MD  [facility] Pager [phone]  If 7PM-7AM, please contact night-coverage [email or web address] Password [email or web address] 04/05/2016, 6:53 AM

## 2016-04-05 NOTE — ED Provider Notes (Signed)
[facility] DEPT Provider Note   CSN: [id number] Arrival date & time: 04/05/16  0504     History   Chief Complaint Chief Complaint  Patient presents with  . Chest Pain    HPI [name] is a 75 y.o. female.  75 yo F with a cc of left arm pain. Patient states this is how she presented with her MIs in the past. Symptoms started when she went to the bathroom this morning. Has had some waves of nausea with this. Denies any chest pain or shortness of breath. Was recently in the hospital and had a cardiac cath and 2 stents placed to the LAD. Patient states that her symptoms feel similar to that time. Had significant improvement with nitroglycerin and aspirin with EMS. Has now just a mild feeling of tightness in her left arm.   The history is provided by the patient.  Chest Pain   This is a new problem. The current episode started 1 to 2 hours ago. The problem occurs constantly. The problem has been gradually improving. Pain location: L arm. The pain is moderate. The quality of the pain is described as brief. The pain radiates to the left arm. Duration of episode(s) is 2 hours. Associated symptoms include nausea. Pertinent negatives include no dizziness, no fever, no headaches, no palpitations, no shortness of breath and no vomiting. She has tried nothing for the symptoms. The treatment provided no relief. Risk factors include being elderly.  Her past medical history is significant for CAD and MI.  Procedure history is positive for cardiac catheterization.    Past Medical History:  Diagnosis Date  . Arthritis   . CAD (coronary artery disease) 03/2009   s/p MI  . 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 CKD (chronic kidney disease) stage 3, GFR 30-59 ml/min   . Emphysema/COPD ([place])   . Ex-smoker quit 2008  . GERD (gastroesophageal reflux disease)   . Helicobacter pylori gastritis 10/2007   treated  . History of CVA (cerebrovascular accident) without residual deficits 01/2012, 06/2012   R hemorrhagic MCA 01/2012 with remote lacunar infarct L putamen and IC, rpt 06/2012 acute multifocal R MCA infarct with remote hemorrhagic strokes affecting L basal ganglia and periventricular white matter, full recovery  . HLD (hyperlipidemia)   . HTN (hypertension)   . Ischemic cardiomyopathy 02/27/2014  . Lung cancer ([place]) dx'd 07/2006   Lung CA, s/p resection, followed by Dr Earlie Server  . Macular degeneration   . Myocardial infarction [facility]) 03/2009   Acute myocardial infarction 2010 - treated with BMS of LCx. LVEF 50% with subsequent CHF  . Osteoporosis 09/2013   T -3.6 forearm 09/2013, T -4.5 forearm 04/2015  . Stroke [facility])     Patient Active Problem List   Diagnosis Date Noted  . Chest pain with moderate risk for cardiac etiology 04/05/2016  . Symptomatic bradycardia 04/01/2016  . Malnutrition of moderate degree 03/20/2016  . COPD (chronic obstructive pulmonary disease) ([place]) 03/19/2016  . HCAP (healthcare-associated pneumonia) 03/19/2016  . Lumbar pain with radiation down left leg 03/02/2016  . Underweight 10/07/2015  . Vertigo 04/22/2015  . Skin nodule 04/22/2015  . Medicare annual wellness visit, subsequent 04/08/2015  . Advanced care planning/counseling discussion 04/08/2015  . Vitamin D insufficiency 02/02/2015  . CKD (chronic kidney disease) stage 3, GFR 30-59 ml/min   . History of CVA (cerebrovascular accident) without residual deficits   .  Prediabetes   . Ischemic cardiomyopathy 02/27/2014  . Osteoporosis 09/24/2013  . COPD exacerbation ([place]) 10/26/2012  . Pulmonary nodule, left 10/26/2012  . TIA (transient ischemic attack) 07/08/2012  . Cancer of lower lobe of right lung ([place]) 04/21/2012  . Chronic combined systolic and diastolic heart failure ([place]) 06/17/2009  . HLD (hyperlipidemia) 04/24/2009  . CAD, NATIVE VESSEL 04/24/2009  . GERD 04/24/2009  . Macular degeneration  (senile) of retina 04/23/2009  . Essential hypertension 04/23/2009  . Osteoarthritis 04/23/2009    Past Surgical History:  Procedure Laterality Date  . BREAST BIOPSY Left   . CARDIAC CATHETERIZATION N/A 02/24/2016   Procedure: Left Heart Cath and Coronary Angiography;  Surgeon: Belva Crome, MD;  Location: [facility];  Service: Cardiovascular;  Laterality: N/A;  . CATARACT EXTRACTION Bilateral   . CORONARY STENT PLACEMENT    . EYE SURGERY    . LUNG REMOVAL, PARTIAL Right 2008  . PARTIAL HYSTERECTOMY  1986   irregular periods, ovaries remain    OB History    No data available       Home Medications    Prior to Admission medications   Medication Sig Start Date End Date Taking? Authorizing Provider  albuterol (PROAIR HFA) 108 (90 Base) MCG/ACT inhaler Inhale 2 puffs into the lungs every 4 (four) hours as needed for wheezing or shortness of breath. 03/24/16   Jose Shirl Harris, MD  aspirin EC 81 MG tablet Take 1 tablet (81 mg total) by mouth daily. 02/26/16   Arbutus Leas, NP  bisoprolol (ZEBETA) 5 MG tablet Take 0.5 tablets (2.5 mg total) by mouth daily. 03/13/16   Brittainy M Simmons, PA-C  budesonide (PULMICORT) 0.5 MG/2ML nebulizer solution Take 2 mLs (0.5 mg total) by nebulization 2 (two) times daily. 03/25/16   Collene Gobble, MD  Calcium Carbonate-Vitamin D (CALCIUM-VITAMIN D) 500-200 MG-UNIT per tablet Take 2 tablets by mouth daily.    Historical Provider, MD  Cholecalciferol (VITAMIN D) 2000 UNITS CAPS Take 1 capsule by mouth daily.    Historical Provider, MD  clopidogrel (PLAVIX) 75 MG tablet Take 1 tablet (75 mg total) by mouth daily. 02/26/16   Arbutus Leas, NP  denosumab (PROLIA) 60 MG/ML SOLN injection Inject 60 mg into the skin every 6 (six) months. Administer in upper arm, thigh, or abdomen 06/13/15   Ria Bush, MD  furosemide (LASIX) 40 MG tablet Take 0.5 tablets (20 mg total) by mouth daily. 04/01/16   Ria Bush, MD  ipratropium-albuterol (DUONEB)  0.5-2.5 (3) MG/3ML SOLN Use 4 times a day and every 4 hrs. As needed 03/25/16   Collene Gobble, MD  isosorbide mononitrate (IMDUR) 30 MG 24 hr tablet TAKE 1 TABLET BY MOUTH DAILY 03/31/16   Sherren Mocha, MD  LOTEMAX 0.5 % ophthalmic suspension Place 1 drop into both eyes daily.  12/27/13   Historical Provider, MD  nitroGLYCERIN (NITROSTAT) 0.4 MG SL tablet Place 1 tablet (0.4 mg total) under the tongue every 5 (five) minutes as needed for chest pain. 07/03/15   Liliane Shi, PA-C  pantoprazole (PROTONIX) 40 MG tablet Take 1 tablet (40 mg total) by mouth daily. 02/27/16   Sherren Mocha, MD  simvastatin (ZOCOR) 40 MG tablet TAKE ONE TABLET BY MOUTH AT BEDTIME 05/14/15   Sherren Mocha, MD  spironolactone (ALDACTONE) 25 MG tablet TAKE 1/2  TABLET BY MOUTH ONCE DAILY. 03/20/15   Sherren Mocha, MD    Family History Family History  Problem Relation Age of  Onset  . CAD Mother 61    MI  . Stroke Mother   . Hypertension Mother   . Heart attack Mother   . CAD Father 52  . Stroke Father   . Breast cancer Sister   . Diabetes Sister     Social History Social History  Substance Use Topics  . Smoking status: Former Smoker    Types: Cigarettes  . Smokeless tobacco: Never Used  . Alcohol use No     Allergies   Actonel [risedronate sodium]; Amlodipine; Fosamax [alendronate sodium]; Lisinopril; Pravastatin; and Codeine   Review of Systems Review of Systems  Constitutional: Negative for chills and fever.  HENT: Negative for congestion and rhinorrhea.   Eyes: Negative for redness and visual disturbance.  Respiratory: Negative for shortness of breath and wheezing.   Cardiovascular: Negative for chest pain and palpitations.  Gastrointestinal: Positive for nausea. Negative for vomiting.  Genitourinary: Negative for dysuria and urgency.  Musculoskeletal: Positive for myalgias. Negative for arthralgias.  Skin: Negative for pallor and wound.  Neurological: Negative for dizziness and headaches.      Physical Exam Updated Vital Signs BP 134/88   Pulse (!) 56   Temp 98 F (36.7 C) (Oral)   Resp 16   Ht '5\' 6"'$  (1.676 m)   Wt 113 lb (51.3 kg)   SpO2 95%   BMI 18.24 kg/m   Physical Exam  Constitutional: She is oriented to person, place, and time. She appears well-developed and well-nourished. No distress.  HENT:  Head: Normocephalic and atraumatic.  Eyes: EOM are normal. Pupils are equal, round, and reactive to light.  Neck: Normal range of motion. Neck supple.  Cardiovascular: Normal rate and regular rhythm.  Exam reveals no gallop and no friction rub.   No murmur heard. Pulmonary/Chest: Effort normal. She has no wheezes. She has no rales.  Abdominal: Soft. She exhibits no distension and no mass. There is no tenderness. There is no guarding.  Musculoskeletal: She exhibits no edema or tenderness.  Neurological: She is alert and oriented to person, place, and time.  Skin: Skin is warm and dry. She is not diaphoretic.  Psychiatric: She has a normal mood and affect. Her behavior is normal.  Nursing note and vitals reviewed.    ED Treatments / Results  Labs (all labs ordered are listed, but only abnormal results are displayed) Labs Reviewed  CBC - Abnormal; Notable for the following:       Result Value   RBC 3.83 (*)    Hemoglobin 11.9 (*)    All other components within normal limits  BASIC METABOLIC PANEL  I-STAT TROPOININ, ED    EKG  EKG Interpretation  Date/Time:  Sunday April 05 2016 05:05:47 EDT Ventricular Rate:  56 PR Interval:    QRS Duration: 96 QT Interval:  406 QTC Calculation: 392 R Axis:   83 Text Interpretation:  Sinus rhythm Multiple ventricular premature complexes Borderline right axis deviation Consider anterior infarct Repol abnrm suggests ischemia, diffuse leads st changes seen previously in lateral leads Otherwise no significant change Confirmed by Tyrone Nine MD, DANIEL [id number]) on 04/05/2016 5:09:59 AM       Radiology Dg Chest 2  View  Result Date: 04/05/2016 CLINICAL DATA:  Acute onset of generalized chest pain and nausea. Shortness of breath. Cough and congestion. Initial encounter. EXAM: CHEST  2 VIEW COMPARISON:  Chest radiograph performed 03/23/2016 FINDINGS: The lungs are hyperexpanded, with flattening of the hemidiaphragms, compatible with COPD. Minimal right basilar opacity may reflect mild  pneumonia. There is no evidence of pleural effusion or pneumothorax. Nodular scarring is again noted at the left lung apex. The heart is normal in size; the mediastinal contour is within normal limits. No acute osseous abnormalities are seen. IMPRESSION: Minimal right basilar opacity may reflect mild pneumonia. Findings of COPD. Electronically Signed   By: Garald Balding M.D.   On: 04/05/2016 06:07    Procedures Procedures (including critical care time)  Medications Ordered in ED Medications  nitroGLYCERIN (NITROSTAT) SL tablet 0.4 mg (not administered)     Initial Impression / Assessment and Plan / ED Course  I have reviewed the triage vital signs and the nursing notes.  Pertinent labs & imaging results that were available during my care of the patient were reviewed by me and considered in my medical decision making (see chart for details).  Clinical Course    75 yo F With a chief complaint of symptoms that feel like her prior MI. EKG with no significant changes will obtain a troponin chest x-ray and discuss with cardiology.  Discussed with Dr. Koleen Nimrod, who reviewed the chart and ecgs.  He does not current suspect this to be cardiac, but does think she would benefit from a obs stay with serial trop.  Consult hospitalist.   CXR with ? RLL pna, patient without cough above baseline, congestion.   The patients results and plan were reviewed and discussed.   Any x-rays performed were independently reviewed by myself.   Differential diagnosis were considered with the presenting HPI.  Medications  nitroGLYCERIN  (NITROSTAT) SL tablet 0.4 mg (not administered)    Vitals:   04/05/16 0508 04/05/16 0509 04/05/16 0515  BP: 133/77  134/88  Pulse: (!) 56  (!) 56  Resp: 16  16  Temp: 98 F (36.7 C)    TempSrc: Oral    SpO2: 96%  95%  Weight:  113 lb (51.3 kg)   Height:  '5\' 6"'$  (1.676 m)     Final diagnoses:  Chest pain with moderate risk for cardiac etiology    Admission/ observation were discussed with the admitting physician, patient and/or family and they are comfortable with the plan.    Final Clinical Impressions(s) / ED Diagnoses   Final diagnoses:  Chest pain with moderate risk for cardiac etiology    New Prescriptions New Prescriptions   No medications on file     Deno Etienne, DO 04/05/16 8811

## 2016-04-06 ENCOUNTER — Telehealth: Payer: Self-pay | Admitting: Adult Health

## 2016-04-06 ENCOUNTER — Encounter: Payer: Self-pay | Admitting: *Deleted

## 2016-04-06 ENCOUNTER — Encounter (HOSPITAL_COMMUNITY): Payer: Self-pay | Admitting: *Deleted

## 2016-04-06 ENCOUNTER — Other Ambulatory Visit: Payer: Self-pay

## 2016-04-06 DIAGNOSIS — E785 Hyperlipidemia, unspecified: Secondary | ICD-10-CM | POA: Diagnosis not present

## 2016-04-06 DIAGNOSIS — I255 Ischemic cardiomyopathy: Secondary | ICD-10-CM | POA: Diagnosis not present

## 2016-04-06 DIAGNOSIS — I2510 Atherosclerotic heart disease of native coronary artery without angina pectoris: Secondary | ICD-10-CM

## 2016-04-06 DIAGNOSIS — R072 Precordial pain: Secondary | ICD-10-CM | POA: Diagnosis not present

## 2016-04-06 DIAGNOSIS — I5042 Chronic combined systolic (congestive) and diastolic (congestive) heart failure: Secondary | ICD-10-CM | POA: Diagnosis not present

## 2016-04-06 DIAGNOSIS — M810 Age-related osteoporosis without current pathological fracture: Secondary | ICD-10-CM | POA: Diagnosis not present

## 2016-04-06 DIAGNOSIS — E440 Moderate protein-calorie malnutrition: Secondary | ICD-10-CM | POA: Diagnosis not present

## 2016-04-06 DIAGNOSIS — N183 Chronic kidney disease, stage 3 (moderate): Secondary | ICD-10-CM | POA: Diagnosis not present

## 2016-04-06 DIAGNOSIS — J449 Chronic obstructive pulmonary disease, unspecified: Secondary | ICD-10-CM | POA: Diagnosis not present

## 2016-04-06 DIAGNOSIS — I130 Hypertensive heart and chronic kidney disease with heart failure and stage 1 through stage 4 chronic kidney disease, or unspecified chronic kidney disease: Secondary | ICD-10-CM | POA: Diagnosis not present

## 2016-04-06 DIAGNOSIS — K219 Gastro-esophageal reflux disease without esophagitis: Secondary | ICD-10-CM | POA: Diagnosis not present

## 2016-04-06 DIAGNOSIS — R0789 Other chest pain: Secondary | ICD-10-CM

## 2016-04-06 DIAGNOSIS — H3530 Unspecified macular degeneration: Secondary | ICD-10-CM | POA: Diagnosis not present

## 2016-04-06 LAB — CBC
HCT: 34.7 % — ABNORMAL LOW (ref 36.0–46.0)
Hemoglobin: 11 g/dL — ABNORMAL LOW (ref 12.0–15.0)
MCH: 30.5 pg (ref 26.0–34.0)
MCHC: 31.7 g/dL (ref 30.0–36.0)
MCV: 96.1 fL (ref 78.0–100.0)
PLATELETS: 208 10*3/uL (ref 150–400)
RBC: 3.61 MIL/uL — ABNORMAL LOW (ref 3.87–5.11)
RDW: 15.2 % (ref 11.5–15.5)
WBC: 5.9 10*3/uL (ref 4.0–10.5)

## 2016-04-06 LAB — COMPREHENSIVE METABOLIC PANEL
ALBUMIN: 2.9 g/dL — AB (ref 3.5–5.0)
ALT: 17 U/L (ref 14–54)
ANION GAP: 4 — AB (ref 5–15)
AST: 18 U/L (ref 15–41)
Alkaline Phosphatase: 55 U/L (ref 38–126)
BUN: 14 mg/dL (ref 6–20)
CHLORIDE: 106 mmol/L (ref 101–111)
CO2: 27 mmol/L (ref 22–32)
Calcium: 8.8 mg/dL — ABNORMAL LOW (ref 8.9–10.3)
Creatinine, Ser: 0.8 mg/dL (ref 0.44–1.00)
Glucose, Bld: 98 mg/dL (ref 65–99)
POTASSIUM: 4 mmol/L (ref 3.5–5.1)
SODIUM: 137 mmol/L (ref 135–145)
Total Bilirubin: 0.6 mg/dL (ref 0.3–1.2)
Total Protein: 5.5 g/dL — ABNORMAL LOW (ref 6.5–8.1)

## 2016-04-06 NOTE — Discharge Instructions (Signed)
Follow with Primary MD Ria Bush, MD in 7 days   Get CBC, CMP, 2 view Chest X ray checked  by Primary MD or SNF MD in 5-7 days ( we routinely change or add medications that can affect your baseline labs and fluid status, therefore we recommend that you get the mentioned basic workup next visit with your PCP, your PCP may decide not to get them or add new tests based on their clinical decision)   Activity: As tolerated with Full fall precautions use walker/cane & assistance as needed   Disposition Home    Diet:   Heart Healthy.  For Heart failure patients - Check your Weight same time everyday, if you gain over 2 pounds, or you develop in leg swelling, experience more shortness of breath or chest pain, call your Primary MD immediately. Follow Cardiac Low Salt Diet and 1.5 lit/day fluid restriction.   On your next visit with your primary care physician please Get Medicines reviewed and adjusted.   Please request your Prim.MD to go over all Hospital Tests and Procedure/Radiological results at the follow up, please get all Hospital records sent to your Prim MD by signing hospital release before you go home.   If you experience worsening of your admission symptoms, develop shortness of breath, life threatening emergency, suicidal or homicidal thoughts you must seek medical attention immediately by calling 911 or calling your MD immediately  if symptoms less severe.  You Must read complete instructions/literature along with all the possible adverse reactions/side effects for all the Medicines you take and that have been prescribed to you. Take any new Medicines after you have completely understood and accpet all the possible adverse reactions/side effects.   Do not drive, operate heavy machinery, perform activities at heights, swimming or participation in water activities or provide baby sitting services if your were admitted for syncope or siezures until you have seen by Primary MD or a  Neurologist and advised to do so again.  Do not drive when taking Pain medications.    Do not take more than prescribed Pain, Sleep and Anxiety Medications  Special Instructions: If you have smoked or chewed Tobacco  in the last 2 yrs please stop smoking, stop any regular Alcohol  and or any Recreational drug use.  Wear Seat belts while driving.   Please note  You were cared for by a hospitalist during your hospital stay. If you have any questions about your discharge medications or the care you received while you were in the hospital after you are discharged, you can call the unit and asked to speak with the hospitalist on call if the hospitalist that took care of you is not available. Once you are discharged, your primary care physician will handle any further medical issues. Please note that NO REFILLS for any discharge medications will be authorized once you are discharged, as it is imperative that you return to your primary care physician (or establish a relationship with a primary care physician if you do not have one) for your aftercare needs so that they can reassess your need for medications and monitor your lab values.

## 2016-04-06 NOTE — Progress Notes (Signed)
Initial Nutrition Assessment  DOCUMENTATION CODES:   Non-severe (moderate) malnutrition in context of chronic illness  INTERVENTION:  Ensure Enlive po BID, each supplement provides 350 kcal and 20 grams of protein   NUTRITION DIAGNOSIS:   Malnutrition related to chronic illness as evidenced by moderate depletion of body fat, severe depletion of muscle mass.   GOAL:   Patient will meet greater than or equal to 90% of their needs   MONITOR:   PO intake, Supplement acceptance, Labs, Weight trends, I & O's, Skin  REASON FOR ASSESSMENT:   Malnutrition Screening Tool    ASSESSMENT:   75 y/o female with PMH of HTN, CAD, CHF, COPD, recent Pneumonia (treated with cefepime transitioned to levofloxacin at discharge) presented with left arm pain. Hospitalist is asked for observation r/o chest pain.   RD familiar with patient from previous admission at which time pt reported eating poorly with 3 small meals daily for the past several months. Pt states that recently she has been drinking Ensure at home, 1-2 times per day, and states that she has been focusing on eating more protein at meals. She reports early satiety with meals so she has been snacking as well. She reports eating better today, with 100% meal completion. Pt continues to have severe muscle wasting and moderate fat wasting.   Labs reviewed.   Diet Order:  Diet Heart Room service appropriate? Yes; Fluid consistency: Thin  Skin:  Reviewed, no issues  Last BM:  9/9  Height:   Ht Readings from Last 1 Encounters:  04/05/16 '5\' 6"'$  (1.676 m)    Weight:   Wt Readings from Last 1 Encounters:  04/06/16 115 lb 11.2 oz (52.5 kg)    Ideal Body Weight:  59.1 kg  BMI:  Body mass index is 18.67 kg/m.  Estimated Nutritional Needs:   Kcal:  1550-1750  Protein:  75-85 grams  Fluid:  1.5-1.7 L/day  EDUCATION NEEDS:   No education needs identified at this time  Scarlette Ar RD, LDN, CSP Inpatient Clinical  Dietitian Pager: [phone] After Hours Pager: [phone]

## 2016-04-06 NOTE — Discharge Summary (Signed)
[name] TIR:[id number] DOB: 30-Dec-1940 DOA: 04/05/2016  PCP: Ria Bush, MD  Admit date: 04/05/2016  Discharge date: 04/06/2016  Admitted From: Home   Disposition:  Home   Recommendations for Outpatient Follow-up:   Follow up with PCP in 1-2 weeks  PCP Please obtain BMP/CBC, 2 view CXR in 1week,  (see Discharge instructions)   PCP Please follow up on the following pending results: None   Home Health: None   Equipment/Devices: none  Consultations: cards Discharge Condition: Stable   CODE STATUS: Full   Diet Recommendation: Heart Healthy   Chief Complaint  Patient presents with  . Chest Pain     Brief history of present illness from the day of admission and additional interim summary     75 y/o female with PMH of HTN, CAD, CHF, COPD, recent Pneumonia (treated with cefepime transitioned to levofloxacin at discharge) presented with left arm pain. Hospitalist is asked for observation r/o chest pain. Atypical presentation, initial trop was negative. Patient is tender on palpation left arm. Will obtain serial trop/ecg. Cont aspirin, plavix, bb, statin, NTG, chest x ray: Minimal right basilar opacity likely residual infiltrate from recent pneumonia. Patient is afebrile, no leukocytosis, no productive cough. She was treated with iv cefepime, and completed levofloxacin as outpatient. Recommended to f/u with CXR in 3-4 weeks to document resolution.   Hospital issues addressed     1. Chest Pain H/O CAD recent Cath with 65% LAD lesion + 1st diag 65% lesion - Ruled out for MI, currently chest pain-free, continue Aspirin, Plavix, statin, nitroglycerin, oxygen, beta blocker and nitrates. EKG showed lateral T-wave inversions consistent with previous EKGs. Seen and cleared by Cards without any further  changes.  2. 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 Compensated from this standpoint, on beta blocker, Imdur along with home dose diuretics.  3. Dyslipidemia. On statin.  4. Repeated on PPI.  5. Macular degeneration. No acute issues. Continue home eyedrops.  6. COPD. No acute issues. She recently has finished treatment for pneumonia. No cough shortness of breath or wheezing. Supportive care only. Chest x-ray shows resolving pneumonia. She will require repeat 2 view chest x-ray in 7-10 days by PCP.   Discharge diagnosis     Active Problems:   HLD (hyperlipidemia)   Macular degeneration (senile) of retina   Essential hypertension   CAD, NATIVE VESSEL   Chronic combined systolic and diastolic heart failure (HCC)   GERD   Osteoarthritis   Cancer of lower lobe of right lung (HCC)   TIA (transient ischemic attack)   Ischemic cardiomyopathy   Osteoporosis   History of CVA (cerebrovascular accident) without residual deficits   COPD (chronic obstructive pulmonary disease) (HCC)   HCAP (healthcare-associated pneumonia)   Chest pain with moderate risk for cardiac etiology   Chest pain    Discharge instructions    Discharge Instructions    Diet - low sodium heart healthy    Complete by:  As directed   Discharge instructions    Complete by:  As directed   Follow with Primary MD Ria Bush, MD in 7 days   Get CBC, CMP, 2 view Chest X ray checked  by Primary MD or SNF MD in 5-7 days ( we routinely change or add medications that can affect your baseline labs and fluid status, therefore we recommend that you get the mentioned basic workup next visit with your PCP, your PCP may decide not to get them or add new tests based on their clinical decision)   Activity: As tolerated with Full fall precautions use walker/cane & assistance as needed   Disposition Home    Diet:   Heart Healthy.  For Heart failure patients - Check your Weight same time everyday,  if you gain over 2 pounds, or you develop in leg swelling, experience more shortness of breath or chest pain, call your Primary MD immediately. Follow Cardiac Low Salt Diet and 1.5 lit/day fluid restriction.   On your next visit with your primary care physician please Get Medicines reviewed and adjusted.   Please request your Prim.MD to go over all Hospital Tests and Procedure/Radiological results at the follow up, please get all Hospital records sent to your Prim MD by signing hospital release before you go home.   If you experience worsening of your admission symptoms, develop shortness of breath, life threatening emergency, suicidal or homicidal thoughts you must seek medical attention immediately by calling 911 or calling your MD immediately  if symptoms less severe.  You Must read complete instructions/literature along with all the possible adverse reactions/side effects for all the Medicines you take and that have been prescribed to you. Take any new Medicines after you have completely understood and accpet all the possible adverse reactions/side effects.   Do not drive, operate heavy machinery, perform activities at heights, swimming or participation in water activities or provide baby sitting services if your were admitted for syncope or siezures until you have seen by Primary MD or a Neurologist and advised to do so again.  Do not drive when taking Pain medications.    Do not take more than prescribed Pain, Sleep and Anxiety Medications  Special Instructions: If you have smoked or chewed Tobacco  in the last 2 yrs please stop smoking, stop any regular Alcohol  and or any Recreational drug use.  Wear Seat belts while driving.   Please note  You were cared for by a hospitalist during your hospital stay. If you have any questions about your discharge medications or the care you received while you were in the hospital after you are discharged, you can call the unit and asked to speak  with the hospitalist on call if the hospitalist that took care of you is not available. Once you are discharged, your primary care physician will handle any further medical issues. Please note that NO REFILLS for any discharge medications will be authorized once you are discharged, as it is imperative that you return to your primary care physician (or establish a relationship with a primary care physician if you do not have one) for your aftercare needs so that they can reassess your need for medications and monitor your lab values.   Increase activity slowly    Complete by:  As directed      Discharge Medications     Medication List    TAKE these medications   albuterol 108 (90 Base) MCG/ACT inhaler Commonly known as:  PROAIR HFA Inhale 2 puffs into the lungs every 4 (four) hours as needed  for wheezing or shortness of breath.   aspirin EC 81 MG tablet Take 1 tablet (81 mg total) by mouth daily.   bisoprolol 5 MG tablet Commonly known as:  ZEBETA Take 0.5 tablets (2.5 mg total) by mouth daily.   budesonide 0.5 MG/2ML nebulizer solution Commonly known as:  PULMICORT Take 2 mLs (0.5 mg total) by nebulization 2 (two) times daily.   calcium-vitamin D 500-200 MG-UNIT tablet Take 1 tablet by mouth 2 (two) times daily.   clopidogrel 75 MG tablet Commonly known as:  PLAVIX Take 1 tablet (75 mg total) by mouth daily.   denosumab 60 MG/ML Soln injection Commonly known as:  PROLIA Inject 60 mg into the skin every 6 (six) months. Administer in upper arm, thigh, or abdomen   furosemide 40 MG tablet Commonly known as:  LASIX Take 0.5 tablets (20 mg total) by mouth daily.   ipratropium-albuterol 0.5-2.5 (3) MG/3ML Soln Commonly known as:  DUONEB Use 4 times a day and every 4 hrs. As needed   isosorbide mononitrate 30 MG 24 hr tablet Commonly known as:  IMDUR TAKE 1 TABLET BY MOUTH DAILY   LOTEMAX 0.5 % ophthalmic suspension Generic drug:  loteprednol Place 1 drop into both eyes  daily.   nitroGLYCERIN 0.4 MG SL tablet Commonly known as:  NITROSTAT Place 1 tablet (0.4 mg total) under the tongue every 5 (five) minutes as needed for chest pain.   pantoprazole 40 MG tablet Commonly known as:  PROTONIX Take 1 tablet (40 mg total) by mouth daily.   simvastatin 40 MG tablet Commonly known as:  ZOCOR TAKE ONE TABLET BY MOUTH AT BEDTIME   spironolactone 25 MG tablet Commonly known as:  ALDACTONE TAKE 1/2  TABLET BY MOUTH ONCE DAILY.   Vitamin D 2000 units Caps Take 1 capsule by mouth daily.       Follow-up Information    Ria Bush, MD. Schedule an appointment as soon as possible for a visit in 1 week(s).   Specialty:  Family Medicine Contact information: [place] [address] Elizabeth III, MD. Schedule an appointment as soon as possible for a visit in 1 week(s).   Specialty:  Cardiology Contact information: [address] [place] [address] [phone]           Major procedures and Radiology Reports - PLEASE review detailed and final reports thoroughly  -      TTE 02-2016  Left ventricle: The cavity size was mildly dilated. Wallthickness was normal. Systolic function was mildly to moderatelyreduced. The estimated ejection fraction was in the range of 40%to 45%. Severe hypokinesis of the basal-midanterolateral andinferolateral myocardium; consistent with infarction in thedistribution of the left circumflex coronary artery. Features areconsistent with a pseudonormal left ventricular filling pattern,with concomitant abnormal relaxation and increased fillingpressure (grade 2 diastolic dysfunction). - Mitral valve: Calcified annulus. There was mild regurgitation. - Left atrium: The atrium was mildly dilated. - Pulmonary arteries: Systolic pressure was mildly increased. PA peak pressure: 45 mm Hg (S).   L cardiac cath on 7/31 by Dr. Tamala Julian   Prox LAD lesion,  65 %stenosed.  Ost 1st Diag lesion, 65 %stenosed.  Prox Cx to Mid Cx lesion, 10 %stenosed.  The left ventricular ejection fraction is 35-45% by visual estimate.  LV end diastolic pressure is normal.  There is moderate left ventricular systolic dysfunction.   Dist LAD lesion, 100 %stenosed.    Dg  Chest 2 View  Result Date: 04/05/2016 CLINICAL DATA:  Acute onset of generalized chest pain and nausea. Shortness of breath. Cough and congestion. Initial encounter. EXAM: CHEST  2 VIEW COMPARISON:  Chest radiograph performed 03/23/2016 FINDINGS: The lungs are hyperexpanded, with flattening of the hemidiaphragms, compatible with COPD. Minimal right basilar opacity may reflect mild pneumonia. There is no evidence of pleural effusion or pneumothorax. Nodular scarring is again noted at the left lung apex. The heart is normal in size; the mediastinal contour is within normal limits. No acute osseous abnormalities are seen. IMPRESSION: Minimal right basilar opacity may reflect mild pneumonia. Findings of COPD. Electronically Signed   By: Garald Balding M.D.   On: 04/05/2016 06:07   Dg Chest 2 View  Result Date: 03/23/2016 CLINICAL DATA:  COPD, coronary artery disease and previous MI, former smoker. Complaining of shortness of breath but no other symptoms today. EXAM: CHEST  2 VIEW COMPARISON:  Portable chest x-ray dated March 18, 2016. FINDINGS: The lungs are hyperinflated with hemidiaphragm flattening. There is no focal infiltrate. There is a trace of pleural fluid blunting the posterior costophrenic angles. The heart and pulmonary vascularity are normal. There are surgical clips in the mediastinum. There is calcification in the wall of the aortic arch. The bony thorax exhibits no acute abnormality. IMPRESSION: COPD. No CHF, pneumonia, nor other acute cardiopulmonary abnormality. Trace of pleural fluid blunting the posterior costophrenic angles bilaterally. Aortic atherosclerosis. Electronically Signed    By: David  Martinique M.D.   On: 03/23/2016 14:39   Dg Chest 2 View  Result Date: 03/11/2016 CLINICAL DATA:  Shortness of Breath EXAM: CHEST  2 VIEW COMPARISON:  11/18/2015 FINDINGS: Cardiac shadow is within normal limits. Emphysematous changes are again seen similar to that noted on prior CT. No focal infiltrate or sizable effusion is seen. Postsurgical changes are noted in the right hilum stable from the prior exam. Stable fullness at the surgical site on the right is noted. No acute bony abnormality is noted. IMPRESSION: No change from the prior exam. Electronically Signed   By: Inez Catalina M.D.   On: 03/11/2016 11:09   Ct Chest Wo Contrast  Result Date: 03/19/2016 CLINICAL DATA:  History of lung cancer. Opacity in lung seen on chest x-ray. EXAM: CT CHEST WITHOUT CONTRAST TECHNIQUE: Multidetector CT imaging of the chest was performed following the standard protocol without IV contrast. COMPARISON:  CT scan March 11, 2016 and chest x-ray March 18, 2016 FINDINGS: Cardiovascular: Coronary artery calcifications remain. The heart size is unchanged. No pericardial effusion. Atherosclerotic changes seen in the non aneurysmal thoracic aorta, stable. The pulmonary arteries are normal in caliber with no obvious abnormality. Mediastinum/Nodes: There is a small hiatal hernia. Mild distal wall thickening may be due to poor distention as it was not present 1 week ago. Shotty nodes in the mediastinum are more prominent in the interval including to the left of the trachea on series 2, image 53 and anterior to the trachea on image 53. Given the 1 week interval, the increased prominence of nodes is likely reactive. Lungs/Pleura: Minimal pleural fluid is seen on the right. No left-sided pleural effusion. Emphysematous changes are again seen throughout the lungs. There is scarring in the apices which is stable. There is bronchial wall thickening in the right lung base. Opacification of right lower lobe bronchi has  significantly improved in the interval with only minimal opacification remaining. There are patchy opacities in the right lung base, some of which are nodular. The bulk of  opacity is seen posteriorly. The right basilar opacity is significantly worsened in the interval. Mild opacity posteriorly in the upper left lower lobe is stable. No other pulmonary nodules, masses, or infiltrates. Upper Abdomen: Evaluation of the upper abdomen is limited without contrast. Atherosclerotic change is seen in the upper abdominal aorta. Adrenal hyperplasia is seen with no adrenal mass noted. No acute abnormalities seen in the upper abdomen. Musculoskeletal: The bones are unchanged with no metastatic disease identified. IMPRESSION: 1. Worsening patchy opacities in the right lung base, some of which are nodular. Given the significant interval increase in 1 week, the findings are likely infectious. Recommend follow-up to resolution. The prominent nodes in the mediastinum are likely reactive. Recommend attention on follow-up. 2. Improved aeration of the right lower lobe bronchi. The previously seen opacification has almost completely resolved, suggesting the previous opacification was due to secretions rather than recurrence. 3. No other interval changes or acute abnormalities. Electronically Signed   By: Dorise Bullion III M.D   On: 03/19/2016 12:22   Ct Angio Chest Pe W And/or Wo Contrast  Result Date: 03/11/2016 CLINICAL DATA:  Acute onset of shortness of breath. Cough for 3 weeks. Myocardial infarction 2 weeks ago. EXAM: CT ANGIOGRAPHY CHEST WITH CONTRAST TECHNIQUE: Multidetector CT imaging of the chest was performed using the standard protocol during bolus administration of intravenous contrast. Multiplanar CT image reconstructions and MIPs were obtained to evaluate the vascular anatomy. CONTRAST:  100 cc Isovue 370 COMPARISON:  Chest x-ray dated 03/11/2016 and CT scan dated 11/18/2015 FINDINGS: No pulmonary emboli. There is new  obstruction of two segmental bronchi to the right lower lobe adjacent to a surgical staple line best seen on images 51 through 58 of series 8 and images 68 through 77 of series 9 and images 88 through 99 of series 5. The Stable emphysema with scarring at the lung apices as well as is stable scarring in the superior segment of the left lower lobe posteriorly and at the right lung base posteriorly. Heart size is normal. No acute bone abnormality. Aortic atherosclerosis. Coronary artery calcifications. Review of the MIP images confirms the above findings. IMPRESSION: 1. No pulmonary emboli. 2. Soft tissue fills 2 segmental bronchi in the right lower lobe next to a surgical staple line at the site of prior partial right lower lobectomy. While this could represent secretions or scarring from radiation therapy, the possibly of recurrent tumor should be considered. Electronically Signed   By: Lorriane Shire M.D.   On: 03/11/2016 13:41   Dg Chest Port 1 View  Result Date: 03/18/2016 CLINICAL DATA:  75 y/o  F; shortness of breath.  History of COPD. EXAM: PORTABLE CHEST 1 VIEW COMPARISON:  Chest CT 03/11/2016.  Chest radiograph 03/11/2016. FINDINGS: Stable cardiac silhouette given differences in positioning technique. Aortic arch calcifications. Nodular opacities in the right infrahilar region and right basilar patchy opacity increased in comparison with prior radiographs. IMPRESSION: Right basilar opacity increased from prior radiographs may represent developing pneumonia or atelectasis given right lower lobe airway obstruction on prior CT. Electronically Signed   By: Kristine Garbe M.D.   On: 03/18/2016 22:46    Micro Results    No results found for this or any previous visit (from the past 240 hour(s)).  Today   Subjective    [name] today has no headache,no chest abdominal pain,no new weakness tingling or numbness, feels much better wants to go home today.    Objective   Blood pressure  120/62, pulse 62,  temperature 97.6 F (36.4 C), temperature source Oral, resp. rate 18, height '5\' 6"'$  (1.676 m), weight 52.5 kg (115 lb 11.2 oz), SpO2 96 %.   Intake/Output Summary (Last 24 hours) at 04/06/16 1758 Last data filed at 04/06/16 1605  Gross per 24 hour  Intake              720 ml  Output             1900 ml  Net            -1180 ml    Exam Awake Alert, Oriented x 3, No new F.N deficits, Normal affect [place].AT,PERRAL Supple Neck,No JVD, No cervical lymphadenopathy appriciated.  Symmetrical Chest wall movement, Good air movement bilaterally, CTAB RRR,No Gallops,Rubs or new Murmurs, No Parasternal Heave +ve B.Sounds, Abd Soft, Non tender, No organomegaly appriciated, No rebound -guarding or rigidity. No Cyanosis, Clubbing or edema, No new Rash or bruise   Data Review   CBC w Diff: Lab Results  Component Value Date   WBC 5.9 04/06/2016   HGB 11.0 (L) 04/06/2016   HGB 12.9 04/30/2014   HCT 34.7 (L) 04/06/2016   HCT 39.3 04/30/2014   PLT 208 04/06/2016   PLT 213 04/30/2014   LYMPHOPCT 31 03/18/2016   LYMPHOPCT 36.7 04/30/2014   MONOPCT 7 03/18/2016   MONOPCT 9.3 04/30/2014   EOSPCT 10 03/18/2016   EOSPCT 6.8 04/30/2014   BASOPCT 1 03/18/2016   BASOPCT 0.8 04/30/2014    CMP: Lab Results  Component Value Date   NA 137 04/06/2016   NA 139 04/30/2014   K 4.0 04/06/2016   K 4.0 04/30/2014   CL 106 04/06/2016   CL 102 04/18/2012   CO2 27 04/06/2016   CO2 27 04/30/2014   BUN 14 04/06/2016   BUN 20.7 04/30/2014   CREATININE 0.80 04/06/2016   CREATININE 0.81 10/17/2015   CREATININE 1.0 04/30/2014   PROT 5.5 (L) 04/06/2016   PROT 6.9 04/30/2014   ALBUMIN 2.9 (L) 04/06/2016   ALBUMIN 3.4 (L) 04/30/2014   BILITOT 0.6 04/06/2016   BILITOT 0.41 04/30/2014   ALKPHOS 55 04/06/2016   ALKPHOS 96 04/30/2014   AST 18 04/06/2016   AST 18 04/30/2014   ALT 17 04/06/2016   ALT 16 04/30/2014  .   Total Time in preparing paper work, data evaluation and todays exam -  35 minutes  Thurnell Lose M.D on 04/06/2016 at 5:58 PM  [facility]   Office  [phone]

## 2016-04-06 NOTE — Telephone Encounter (Signed)
Spoke with pt. She is currently admitted at [facility]. Pt has an appointment with TP tomorrow. She would like to reschedule this. OV has been rescheduled to 04/21/16 at 11:15am. Nothing further was needed.

## 2016-04-06 NOTE — Progress Notes (Signed)
PROGRESS NOTE                                                                                                                                                                                                             Patient Demographics:    [name], is a 75 y.o. female, DOB - Oct 13, 1940, BVQ:[id number]  Admit date - 04/05/2016   Admitting Physician Kinnie Feil, MD  Outpatient Primary MD for the patient is Ria Bush, MD  LOS - 0  Chief Complaint  Patient presents with  . Chest Pain       Brief Narrative    75 y/o female with PMH of HTN, CAD, CHF, COPD, recent Pneumonia (treated with cefepime transitioned to levofloxacin at discharge) presented with left arm pain. Hospitalist is asked for observation r/o chest pain. Atypical presentation, initial trop was negative. Patient is tender on palpation left arm. Will obtain serial trop/ecg. Cont aspirin, plavix, bb, statin, NTG, chest x ray: Minimal right basilar opacity likely residual infiltrate from recent pneumonia. Patient is afebrile, no leukocytosis, no productive cough. She was treated with iv cefepime, and completed levofloxacin as outpatient. Recommended to f/u with CXR in 3-4 weeks to document resolution.    Subjective:    [name] today has, No headache, No chest pain, No abdominal pain - No Nausea, No new weakness tingling or numbness, No Cough - SOB.   Assessment  & Plan :     1. Chest Pain H/O CAD recent Cath with 65% LAD lesion + 1st diag 65% lesion - Ruled out for MI, currently chest pain-free, continue Aspirin, Plavix, statin, nitroglycerin, oxygen, beta blocker and nitrates. EKG shows lateral T-wave inversions consistent with previous EKGs. Patient is complex will request cardiology to evaluate.  2. Chronic combined systolic and diastolic CHF recent EF 80%. Compensated from this standpoint, on beta blocker, Imdur along with home dose  diuretics.  3. Dyslipidemia. On statin.  4. Repeated on PPI.  5. Macular degeneration. No acute issues. Continue home eyedrops.  6. COPD. No acute issues. She recently has finished treatment for pneumonia. No cough shortness of breath or wheezing. Supportive care only. Chest x-ray shows resolving pneumonia. She will require repeat 2 view chest x-ray in 7-10 days by PCP.    Family Communication  :  none  Code Status :  Full  Diet : Heart Healthy  Disposition Plan  :  TBD  Consults  :  Cards  Procedures  :    TTE 02-2016  Left ventricle: The cavity size was mildly dilated. Wall thickness was normal. Systolic function was mildly to moderately reduced. The estimated ejection fraction was in the range of 40% to 45%. Severe hypokinesis of the basal-midanterolateral and inferolateral myocardium; consistent with infarction in the distribution of the left circumflex coronary artery. Features are consistent with a pseudonormal left ventricular filling pattern, with concomitant abnormal relaxation and increased filling pressure (grade 2 diastolic dysfunction). - Mitral valve: Calcified annulus. There was mild regurgitation. - Left atrium: The atrium was mildly dilated. - Pulmonary arteries: Systolic pressure was mildly increased. PA peak pressure: 45 mm Hg (S).   L cardiac cath on 7/31 by Dr. Tamala Julian   Prox LAD lesion, 65 %stenosed.  Ost 1st Diag lesion, 65 %stenosed.  Prox Cx to Mid Cx lesion, 10 %stenosed.  The left ventricular ejection fraction is 35-45% by visual estimate.  LV end diastolic pressure is normal.  There is moderate left ventricular systolic dysfunction.   Dist LAD lesion, 100 %stenosed.    DVT Prophylaxis  :  Lovenox    Lab Results  Component Value Date   PLT 208 04/06/2016    Inpatient Medications  Scheduled Meds: . aspirin EC  81 mg Oral Daily  . bisoprolol  2.5 mg Oral Daily  . budesonide  0.5 mg Nebulization BID  . clopidogrel  75 mg Oral Daily   . enoxaparin (LOVENOX) injection  40 mg Subcutaneous Q24H  . feeding supplement (ENSURE ENLIVE)  237 mL Oral BID BM  . furosemide  20 mg Oral Daily  . guaiFENesin  600 mg Oral BID  . isosorbide mononitrate  30 mg Oral Daily  . loteprednol  1 drop Both Eyes Daily  . pantoprazole  40 mg Oral Daily  . simvastatin  40 mg Oral QHS  . sodium chloride flush  3 mL Intravenous Q12H  . spironolactone  12.5 mg Oral Daily   Continuous Infusions:  PRN Meds:.acetaminophen, bisacodyl, ipratropium-albuterol, ketorolac, magnesium citrate, nitroGLYCERIN, ondansetron (ZOFRAN) IV, senna-docusate, traZODone  Antibiotics  :    Anti-infectives    None         Objective:   Vitals:   04/05/16 1200 04/05/16 2117 04/05/16 2205 04/06/16 0423  BP: (!) 97/54  98/60 (!) 115/59  Pulse: (!) 57  (!) 46 (!) 45  Resp: '20  18 17  '$ Temp: 98.2 F (36.8 C)  97 F (36.1 C) 97.4 F (36.3 C)  TempSrc: Oral  Oral Oral  SpO2: 97% 97% 97% 98%  Weight:    52.5 kg (115 lb 11.2 oz)  Height:        Wt Readings from Last 3 Encounters:  04/06/16 52.5 kg (115 lb 11.2 oz)  04/01/16 52.1 kg (114 lb 12.8 oz)  03/31/16 51.3 kg (113 lb)     Intake/Output Summary (Last 24 hours) at 04/06/16 1037 Last data filed at 04/06/16 0834  Gross per 24 hour  Intake              960 ml  Output              600 ml  Net              360 ml     Physical Exam  Awake Alert, Oriented X 3, No new F.N deficits, Normal affect [place].AT,PERRAL Supple Neck,No JVD, No cervical  lymphadenopathy appriciated.  Symmetrical Chest wall movement, Good air movement bilaterally, CTAB RRR,No Gallops,Rubs or new Murmurs, No Parasternal Heave +ve B.Sounds, Abd Soft, No tenderness, No organomegaly appriciated, No rebound - guarding or rigidity. No Cyanosis, Clubbing or edema, No new Rash or bruise       Data Review:    CBC  Recent Labs Lab 04/05/16 0520 04/06/16 0253  WBC 6.2 5.9  HGB 11.9* 11.0*  HCT 36.5 34.7*  PLT 216 208  MCV 95.3  96.1  MCH 31.1 30.5  MCHC 32.6 31.7  RDW 15.2 15.2    Chemistries   Recent Labs Lab 04/01/16 1006 04/05/16 0520 04/06/16 0253  NA 138 140 137  K 3.9 4.1 4.0  CL 99 107 106  CO2 33* 25 27  GLUCOSE 83 98 98  BUN '20 19 14  '$ CREATININE 0.80 0.70 0.80  CALCIUM 9.4 9.2 8.8*  AST  --   --  18  ALT  --   --  17  ALKPHOS  --   --  55  BILITOT  --   --  0.6   ------------------------------------------------------------------------------------------------------------------ No results for input(s): CHOL, HDL, LDLCALC, TRIG, CHOLHDL, LDLDIRECT in the last 72 hours.  Lab Results  Component Value Date   HGBA1C 5.9 (H) 02/24/2016   ------------------------------------------------------------------------------------------------------------------ No results for input(s): TSH, T4TOTAL, T3FREE, THYROIDAB in the last 72 hours.  Invalid input(s): FREET3 ------------------------------------------------------------------------------------------------------------------ No results for input(s): VITAMINB12, FOLATE, FERRITIN, TIBC, IRON, RETICCTPCT in the last 72 hours.  Coagulation profile No results for input(s): INR, PROTIME in the last 168 hours.  No results for input(s): DDIMER in the last 72 hours.  Cardiac Enzymes  Recent Labs Lab 04/05/16 0749 04/05/16 1123 04/05/16 1312  TROPONINI <0.03 <0.03 <0.03   ------------------------------------------------------------------------------------------------------------------ No results found for: BNP  Micro Results No results found for this or any previous visit (from the past 240 hour(s)).  Radiology Reports Dg Chest 2 View  Result Date: 04/05/2016 CLINICAL DATA:  Acute onset of generalized chest pain and nausea. Shortness of breath. Cough and congestion. Initial encounter. EXAM: CHEST  2 VIEW COMPARISON:  Chest radiograph performed 03/23/2016 FINDINGS: The lungs are hyperexpanded, with flattening of the hemidiaphragms, compatible  with COPD. Minimal right basilar opacity may reflect mild pneumonia. There is no evidence of pleural effusion or pneumothorax. Nodular scarring is again noted at the left lung apex. The heart is normal in size; the mediastinal contour is within normal limits. No acute osseous abnormalities are seen. IMPRESSION: Minimal right basilar opacity may reflect mild pneumonia. Findings of COPD. Electronically Signed   By: Garald Balding M.D.   On: 04/05/2016 06:07   Time Spent in minutes  30   SINGH,PRASHANT K M.D on 04/06/2016 at 10:37 AM  Between 7am to 7pm - Pager - [phone]  After 7pm go to [email or web address] - password [facility]  [facility] -  Office  [phone]

## 2016-04-06 NOTE — Telephone Encounter (Signed)
I saw in other notes that Prolia is being deferred for now, but I wanted to document this info for future use.    Per [place] at [place]. [place]ene[place] is approved for Prolia eff 03/24/2016-09/22/2016, approval #[id number].  W/out an OV Ms. Mazur will have an estimated responsibility of $195; w/an OV her estimated responsibility increases to $205.  Please make pt aware this is an estimate and we will not know an exact amt until insurance(s) has/have paid.  I have sent a copy of the summary of benefits to be scanned into pt's chart.    If pt cannot afford $195-$205 for her injection, please advise her to contact [employer] at [phone] and select option #1 to see if she qualifies for one of their assistance programs.  If she qualifies they will instruct her how to proceed.    Once pt recs injection, please let me know actual injection date so I can update the Prolia portal.  If you have any questions, please let me know.  Thank you!

## 2016-04-06 NOTE — Care Management Note (Signed)
Case Management Note  Patient Details  Name: Christie Viscomi Venturino MRN: [id number] Date of Birth: 1941/07/22  Subjective/Objective:    CM following for progression and d/c planning.                 Action/Plan: 04/06/2016 Noted that pt is active with [facility] notified of adm and order entered to resume services as needed. Will continue to follow.   Expected Discharge Date:  04/06/16               Expected Discharge Plan:  [place]  In-House Referral:  NA  Discharge planning Services  CM Consult  Post Acute Care Choice:  Home Health Choice offered to:  Patient  DME Arranged:  N/A DME Agency:  NA  HH Arranged:  RN, PT, Nurse's Aide, Social Work [employer] Agency:  [facility]  Status of Service:  Completed, signed off  If discussed at [employer], dates discussed:    Additional Comments:  Adron Bene, RN 04/06/2016, 11:37 AM

## 2016-04-06 NOTE — Consult Note (Addendum)
CARDIOLOGY CONSULT NOTE   Patient ID: [name] MRN: [id number] DOB/AGE: 10/12/1940 75 y.o.  Admit date: 04/05/2016  Requesting Physician: Dr. Lilia Argue Primary Physician:   Ria Bush, MD Primary Cardiologist:  Dr. Burt Knack Reason for Consultation:  L arm pain  HPI: [name] is a 75 y.o. female with a history of HTN, HLD, ischemic cardiomyopathy (EF 40-45%), CAD (BMS to circumflex in 2010); recent STEMI, COPD, CVA, CKD, history of lung cancer s/p R lobectomy and chronic combined S/D CHF who presented to [facility] on 04/05/16 with left arm pain similar to her recent sx with MI.  She presented to the ED on 02/24/16 with chest pain that awakened her from sleep. She called EMS and EKG showed ST elevation in lateral leads. Code STEMI was called and she was taken urgently to the cath lab. She was found to have a widely, previously placed circumflex stent. 65% proximal LAD stenosis that was nonobstructive as well as the 5% first diagonal lesion. This was also nonobstructive. Both lesions were unchanged compared to previous cardiac catheterization. She was found to have a new, very distal stenosis in her LAD which was felt to be the culprit vessel. This was 100% stenosis and a small tortuous segment. This was not amendable to PCI. Plavix was added to her medication regimen. She will continue dual antiplatelet therapy indefinitely. Her EF was 35% by cath, spironolactone was added and her Coreg was titrated up to 12.'5mg'$  BID. She had some periods of ventricular trigeminy during her hospitalization.   She was seen for post hospital follow up by Lyda Jester PA-C on 03/12/16. She had some wheezing felt to be related to Coreg and was switched to Bispprolol '5mg'$  daily. 2D ECHO was ordered which showed EF 40-45% with severe hypokinesis of the basal- midanterolateral and inferolateral myocardium, G2DD, mild MR, PA pressure 45.  She had a recent admission for COPD exacerbation and possible pneumonia  on 03/19/16, discharged on Levaquin and finished her course of antibiotics 4 days ago. She had some hypotension and Losartan was held at discharge and Bisoprolol decreased to 2.5 mg daily.   She was in her usual state of health until yesterday when she went to the bathroom and had left sided arm pain with nausea similar to her sx during her MI last month. No chest pain. She is now feeling quite well with no chest pain after 4SL NTG. She had chest pain for at least 2 hours. She has been up walking around her room with no sx. No LE edema, orthopnea or PND. No dizziness or syncope. No blood in her stool or urine. She is complaint with all her medications.   Past Medical History:  Diagnosis Date  . Arthritis   . CAD (coronary artery disease) 03/2009   s/p MI  . Chronic combined systolic and diastolic heart failure ([place]) 06/17/2009   Qualifier: Diagnosis of  By: Burt Knack, MD, Clayburn Pert   . CKD (chronic kidney disease) stage 3, GFR 30-59 ml/min   . Emphysema/COPD ([place])   . Ex-smoker quit 2008  . GERD (gastroesophageal reflux disease)   . Helicobacter pylori gastritis 10/2007   treated  . History of CVA (cerebrovascular accident) without residual deficits 01/2012, 06/2012   R hemorrhagic MCA 01/2012 with remote lacunar infarct L putamen and IC, rpt 06/2012 acute multifocal R MCA infarct with remote hemorrhagic strokes affecting L basal ganglia and periventricular white matter, full recovery  . HLD (hyperlipidemia)   . HTN (  hypertension)   . Ischemic cardiomyopathy 02/27/2014  . Lung cancer ([place]) dx'd 07/2006   Lung CA, s/p resection, followed by Dr Earlie Server  . Macular degeneration   . Myocardial infarction [facility]) 03/2009   Acute myocardial infarction 2010 - treated with BMS of LCx. LVEF 50% with subsequent CHF  . Osteoporosis 09/2013   T -3.6 forearm 09/2013, T -4.5 forearm 04/2015  . Stroke [facility])      Past Surgical History:  Procedure Laterality Date  . BREAST BIOPSY Left   . CARDIAC  CATHETERIZATION N/A 02/24/2016   Procedure: Left Heart Cath and Coronary Angiography;  Surgeon: Belva Crome, MD;  Location: [place] [facility];  Service: Cardiovascular;  Laterality: N/A;  . CATARACT EXTRACTION Bilateral   . CORONARY STENT PLACEMENT    . EYE SURGERY    . LUNG REMOVAL, PARTIAL Right 2008  . PARTIAL HYSTERECTOMY  1986   irregular periods, ovaries remain    Allergies  Allergen Reactions  . Actonel [Risedronate Sodium] Other (See Comments)    Headache  . Amlodipine Other (See Comments)    Pedal edema  . Fosamax [Alendronate Sodium] Other (See Comments)    Unable to tolerate  . Lisinopril Cough  . Pravastatin Other (See Comments)    Constipation.  . Codeine Rash    I have reviewed the patient's current medications . aspirin EC  81 mg Oral Daily  . bisoprolol  2.5 mg Oral Daily  . budesonide  0.5 mg Nebulization BID  . clopidogrel  75 mg Oral Daily  . enoxaparin (LOVENOX) injection  40 mg Subcutaneous Q24H  . feeding supplement (ENSURE ENLIVE)  237 mL Oral BID BM  . furosemide  20 mg Oral Daily  . guaiFENesin  600 mg Oral BID  . isosorbide mononitrate  30 mg Oral Daily  . loteprednol  1 drop Both Eyes Daily  . pantoprazole  40 mg Oral Daily  . simvastatin  40 mg Oral QHS  . sodium chloride flush  3 mL Intravenous Q12H  . spironolactone  12.5 mg Oral Daily     acetaminophen, bisacodyl, ipratropium-albuterol, ketorolac, magnesium citrate, nitroGLYCERIN, ondansetron (ZOFRAN) IV, senna-docusate, traZODone  Prior to Admission medications   Medication Sig Start Date End Date Taking? Authorizing Provider  albuterol (PROAIR HFA) 108 (90 Base) MCG/ACT inhaler Inhale 2 puffs into the lungs every 4 (four) hours as needed for wheezing or shortness of breath. 03/24/16  Yes Jose Shirl Harris, MD  aspirin EC 81 MG tablet Take 1 tablet (81 mg total) by mouth daily. 02/26/16  Yes Arbutus Leas, NP  bisoprolol (ZEBETA) 5 MG tablet Take 0.5 tablets (2.5 mg total) by mouth  daily. 03/13/16  Yes Brittainy M Simmons, PA-C  budesonide (PULMICORT) 0.5 MG/2ML nebulizer solution Take 2 mLs (0.5 mg total) by nebulization 2 (two) times daily. 03/25/16  Yes Collene Gobble, MD  Calcium Carbonate-Vitamin D (CALCIUM-VITAMIN D) 500-200 MG-UNIT per tablet Take 1 tablet by mouth 2 (two) times daily.    Yes Historical Provider, MD  Cholecalciferol (VITAMIN D) 2000 UNITS CAPS Take 1 capsule by mouth daily.   Yes Historical Provider, MD  clopidogrel (PLAVIX) 75 MG tablet Take 1 tablet (75 mg total) by mouth daily. 02/26/16  Yes Arbutus Leas, NP  denosumab (PROLIA) 60 MG/ML SOLN injection Inject 60 mg into the skin every 6 (six) months. Administer in upper arm, thigh, or abdomen 06/13/15  Yes Ria Bush, MD  furosemide (LASIX) 40 MG tablet Take 0.5 tablets (20  mg total) by mouth daily. 04/01/16  Yes Ria Bush, MD  ipratropium-albuterol (DUONEB) 0.5-2.5 (3) MG/3ML SOLN Use 4 times a day and every 4 hrs. As needed 03/25/16  Yes Collene Gobble, MD  isosorbide mononitrate (IMDUR) 30 MG 24 hr tablet TAKE 1 TABLET BY MOUTH DAILY 03/31/16  Yes Sherren Mocha, MD  LOTEMAX 0.5 % ophthalmic suspension Place 1 drop into both eyes daily.  12/27/13  Yes Historical Provider, MD  nitroGLYCERIN (NITROSTAT) 0.4 MG SL tablet Place 1 tablet (0.4 mg total) under the tongue every 5 (five) minutes as needed for chest pain. 07/03/15  Yes Scott T Kathlen Mody, PA-C  pantoprazole (PROTONIX) 40 MG tablet Take 1 tablet (40 mg total) by mouth daily. 02/27/16  Yes Sherren Mocha, MD  simvastatin (ZOCOR) 40 MG tablet TAKE ONE TABLET BY MOUTH AT BEDTIME 05/14/15  Yes Sherren Mocha, MD  spironolactone (ALDACTONE) 25 MG tablet TAKE 1/2  TABLET BY MOUTH ONCE DAILY. 03/20/15  Yes Sherren Mocha, MD     Social History   Social History  . Marital status: Widowed    Spouse name: N/A  . Number of children: 1  . Years of education: N/A   Occupational History  . retired     Insurance underwriter   Social History Main Topics  . Smoking  status: Former Smoker    Types: Cigarettes  . Smokeless tobacco: Never Used  . Alcohol use No  . Drug use: No  . Sexual activity: No   Other Topics Concern  . Not on file   Social History Narrative   The patient is a widow and has one son and one dog.     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  She does not drink alcohol. 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   Ed: HS   Activity: no regular exercise   Diet: some water, some fruits/vegetables    Lives in a one story home.     Family Status  Relation Status  . Mother Deceased  . Father Deceased  . Brother Alive  . Sister Alive  . Son Alive  . Maternal Grandmother Deceased  . Maternal Grandfather Deceased  . Paternal Grandmother Deceased  . Sister   . Sister    Family History  Problem Relation Age of Onset  . CAD Mother 100    MI  . Stroke Mother   . Hypertension Mother   . Heart attack Mother   . CAD Father 101  . Stroke Father   . Breast cancer Sister   . Diabetes Sister     ROS:  Full 14 point review of systems complete and found to be negative unless listed above.  Physical Exam: Blood pressure (!) 115/59, pulse (!) 45, temperature 97.4 F (36.3 C), temperature source Oral, resp. rate 17, height '5\' 6"'$  (1.676 m), weight 115 lb 11.2 oz (52.5 kg), SpO2 98 %.  General: Well developed, well nourished, female in no acute distress Head: Eyes PERRLA, No xanthomas.   Normocephalic and atraumatic, oropharynx without edema or exudate.  Lungs: CTAB Heart: HRRR S1 S2, no rub/gallop, Heart regular rate and rhythm with S1, S2  murmur. pulses are 2+ extrem.   Neck: No carotid bruits. No lymphadenopathy.  No JVD. Abdomen: Bowel sounds present, abdomen soft and non-tender without masses or hernias noted. Msk:  No spine or cva tenderness. No weakness, no joint deformities or effusions. Extremities: No  clubbing or cyanosis.  No LE edema.  Neuro: Alert and oriented X  3. No focal deficits noted. Psych:  Good affect, responds appropriately Skin: No rashes or lesions noted.  Labs:   Lab Results  Component Value Date   WBC 5.9 04/06/2016   HGB 11.0 (L) 04/06/2016   HCT 34.7 (L) 04/06/2016   MCV 96.1 04/06/2016   PLT 208 04/06/2016   No results for input(s): INR in the last 72 hours.  Recent Labs Lab 04/06/16 0253  NA 137  K 4.0  CL 106  CO2 27  BUN 14  CREATININE 0.80  CALCIUM 8.8*  PROT 5.5*  BILITOT 0.6  ALKPHOS 55  ALT 17  AST 18  GLUCOSE 98  ALBUMIN 2.9*   Magnesium  Date Value Ref Range Status  02/26/2016 2.0 1.7 - 2.4 mg/dL Final    Recent Labs  04/05/16 0749 04/05/16 1123 04/05/16 1312  TROPONINI <0.03 <0.03 <0.03    Recent Labs  04/05/16 0529 04/05/16 0752  TROPIPOC 0.01 0.02   Pro B Natriuretic peptide (BNP)  Date/Time Value Ref Range Status  03/29/2013 04:30 PM 271.0 (H) 0.0 - 100.0 pg/mL Final  08/16/2012 10:57 AM 510.0 (H) 0.0 - 100.0 pg/mL Final   Lab Results  Component Value Date   CHOL 99 02/24/2016   HDL 42 02/24/2016   LDLCALC 47 02/24/2016   TRIG 48 02/24/2016   Lab Results  Component Value Date   DDIMER 1.53 (H) 03/11/2016   Lipase  Date/Time Value Ref Range Status  04/07/2009 09:15 PM 26 11 - 59 U/L Final   TSH  Date/Time Value Ref Range Status  07/09/2012 01:34 PM 1.174 0.350 - 4.500 uIU/mL Final   No results found for: VITAMINB12, FOLATE, FERRITIN, TIBC, IRON, RETICCTPCT  ECG:  Sinus brady with TWI in V5-V6 similar to previous changes.   Radiology:  Dg Chest 2 View  Result Date: 04/05/2016 CLINICAL DATA:  Acute onset of generalized chest pain and nausea. Shortness of breath. Cough and congestion. Initial encounter. EXAM: CHEST  2 VIEW COMPARISON:  Chest radiograph performed 03/23/2016 FINDINGS: The lungs are hyperexpanded, with flattening of the hemidiaphragms, compatible with COPD. Minimal right basilar opacity may reflect mild pneumonia. There is no evidence of pleural effusion  or pneumothorax. Nodular scarring is again noted at the left lung apex. The heart is normal in size; the mediastinal contour is within normal limits. No acute osseous abnormalities are seen. IMPRESSION: Minimal right basilar opacity may reflect mild pneumonia. Findings of COPD. Electronically Signed   By: Garald Balding M.D.   On: 04/05/2016 06:07   2D ECHO: 03/20/2016 LV EF: 40% -   45% Study Conclusions - Left ventricle: The cavity size was mildly dilated. Wall   thickness was normal. Systolic function was mildly to moderately   reduced. The estimated ejection fraction was in the range of 40%   to 45%. Severe hypokinesis of the basal-midanterolateral and   inferolateral myocardium; consistent with infarction in the   distribution of the left circumflex coronary artery. Features are   consistent with a pseudonormal left ventricular filling pattern,   with concomitant abnormal relaxation and increased filling   pressure (grade 2 diastolic dysfunction). - Mitral valve: Calcified annulus. There was mild regurgitation. - Left atrium: The atrium was mildly dilated. - Pulmonary arteries: Systolic pressure was mildly increased. PA   peak pressure: 45 mm Hg (S).   ASSESSMENT AND PLAN:    Active Problems:   HLD (hyperlipidemia)   Macular degeneration (  senile) of retina   Essential hypertension   CAD, NATIVE VESSEL   Chronic combined systolic and diastolic heart failure (HCC)   GERD   Osteoarthritis   Cancer of lower lobe of right lung (HCC)   TIA (transient ischemic attack)   Ischemic cardiomyopathy   Osteoporosis   History of CVA (cerebrovascular accident) without residual deficits   COPD (chronic obstructive pulmonary disease) ([place])   HCAP (healthcare-associated pneumonia)   Chest pain with moderate risk for cardiac etiology   Chest pain  Chest pain: troponin neg x3. ECG with no acute ST or TW changes (TWI in V5-V6 similar to previous). She is now completely chest pain free after 4  SL NTG. She feels good today. I think we could possibly discharge her home with increased imdur '30mg'$ --> '60mg'$  daily vs outpatient stress testing. She has a very distal totally occluded LAD, in a small tortuous segment, not amendable to PCI, that could be causing her chest pain. With no objective signs of ischemia, I think we can try to titrate medical therapy for now. Dr. Tamala Julian to see  CAD: s/p BMS to LCx in 2010 and STEMI 01/2016 secondary to a very distal totally occluded LAD, in a small tortuous segment, not amendable to PCI. Also with 65% proximal LAD and a 65%1st Diagn, both nonosbstructive and unchanged from prior cath. Continue medical therapy with ASA + Plavix indefinitely, Imdur, and bisoprolol   Cardiomyopathy/ Chronic combined S/D CHF: EF 40-45% with severe hypokinesis of the basal- midanterolateral and inferolateral myocardium, G2DD, mild MR, PA pressure 45 by most recent echo. Continue BB, spironolactone and nitrate. ARB held during most recent admission 2/2 hypotension. Her volume status is stable with current dose of lasix.  HTN: BP currently well controlled   HLD: LDL 49 on most recent lipid panel. Continue statin   NSVT: she does have some a run of 3 and 5 beats of NSVT and frequent ventricular ectopy . Continue BB  Signed: Angelena Form, PA-C 04/06/2016 9:28 AM  Pager [phone]  Co-Sign MD The patient has been seen in conjunction with Nell Range, PAC. All aspects of care have been considered and discussed. The patient has been personally interviewed, examined, and all clinical data has been reviewed.   The patient is left arm discomfort has resolved. It has not recurred and 36 hours. She is ambulated without difficulty. There is no evidence of infarction.  I reviewed the digital images from her recent catheterization. This confirms very distal LAD occlusion. This should not be a problem that will cause pain.  Perhaps she had an episode of coronary spasm. In the  absence of marker elevation, no further evaluation is needed. I don't believe repeat catheterization is necessary. She needs to follow-up with her primary cardiologist, Dr. Burt Knack within the next 2-3 weeks.

## 2016-04-06 NOTE — Progress Notes (Signed)
Pt has orders to be discharged. Discharge instructions given and pt has no additional questions at this time. Medication regimen reviewed and pt educated. Pt verbalized understanding and has no additional questions. Telemetry box removed. IV removed and site in good condition. Pt stable and waiting for transportation.   Andruw Battie RN 

## 2016-04-06 NOTE — Care Management Note (Signed)
Case Management Note  Patient Details  Name: [name] MRN: [id number] Date of Birth: 1941-06-19  Subjective/Objective:    CM following for progression and d/c planning.                 Action/Plan: 04/06/2016 Noted that pt is active with [facility] for [place], [place], [place] aide, and Education officer, museum. This CM spoke with pt who wishes for these services to be resumed. Order to resume entered and [facility] notified of order.   Expected Discharge Date:  04/06/16               Expected Discharge Plan:  [place]  In-House Referral:  NA  Discharge planning Services  CM Consult  Post Acute Care Choice:  Home Health Choice offered to:  Patient  DME Arranged:  N/A DME Agency:  NA  HH Arranged:  RN, PT, Nurse's Aide, Social Work [employer] Agency:  [place]  Status of Service:  Completed, signed off  If discussed at [employer], dates discussed:    Additional Comments:  Adron Bene, RN 04/06/2016, 11:41 AM

## 2016-04-07 ENCOUNTER — Ambulatory Visit: Payer: PPO | Admitting: Adult Health

## 2016-04-07 DIAGNOSIS — J441 Chronic obstructive pulmonary disease with (acute) exacerbation: Secondary | ICD-10-CM | POA: Diagnosis not present

## 2016-04-07 DIAGNOSIS — J189 Pneumonia, unspecified organism: Secondary | ICD-10-CM | POA: Diagnosis not present

## 2016-04-07 DIAGNOSIS — C3431 Malignant neoplasm of lower lobe, right bronchus or lung: Secondary | ICD-10-CM | POA: Diagnosis not present

## 2016-04-07 DIAGNOSIS — E440 Moderate protein-calorie malnutrition: Secondary | ICD-10-CM | POA: Diagnosis not present

## 2016-04-07 DIAGNOSIS — I110 Hypertensive heart disease with heart failure: Secondary | ICD-10-CM | POA: Diagnosis not present

## 2016-04-07 DIAGNOSIS — I2510 Atherosclerotic heart disease of native coronary artery without angina pectoris: Secondary | ICD-10-CM | POA: Diagnosis not present

## 2016-04-07 DIAGNOSIS — I255 Ischemic cardiomyopathy: Secondary | ICD-10-CM | POA: Diagnosis not present

## 2016-04-07 DIAGNOSIS — E785 Hyperlipidemia, unspecified: Secondary | ICD-10-CM | POA: Diagnosis not present

## 2016-04-07 DIAGNOSIS — Z7982 Long term (current) use of aspirin: Secondary | ICD-10-CM | POA: Diagnosis not present

## 2016-04-07 DIAGNOSIS — Z7951 Long term (current) use of inhaled steroids: Secondary | ICD-10-CM | POA: Diagnosis not present

## 2016-04-07 DIAGNOSIS — Z7901 Long term (current) use of anticoagulants: Secondary | ICD-10-CM | POA: Diagnosis not present

## 2016-04-07 DIAGNOSIS — I5042 Chronic combined systolic (congestive) and diastolic (congestive) heart failure: Secondary | ICD-10-CM | POA: Diagnosis not present

## 2016-04-07 DIAGNOSIS — J440 Chronic obstructive pulmonary disease with acute lower respiratory infection: Secondary | ICD-10-CM | POA: Diagnosis not present

## 2016-04-07 DIAGNOSIS — Z87891 Personal history of nicotine dependence: Secondary | ICD-10-CM | POA: Diagnosis not present

## 2016-04-07 NOTE — Telephone Encounter (Signed)
I spoke with the pt and she is calling to verify her medications after her hospitalization.  She is confused about Isosorbide [place] and Spironolactone.  I reviewed her discharge instructions and verified medications. The pt feels fine today and BP was 119/73, pulse 46.  I verified multiple times with the pt that she has stopped Bisoprolol and she did last week as advised by PCP. I will contact the pt later this week to discuss a follow-up appointment next week with Dr Burt Knack.

## 2016-04-07 NOTE — Telephone Encounter (Signed)
Reviewed chart and the pt was recently admitted to the hospital 9/10-9/11/17.

## 2016-04-07 NOTE — Telephone Encounter (Signed)
New message   Pt verbalized that she is calling Rn back

## 2016-04-08 ENCOUNTER — Telehealth: Payer: Self-pay

## 2016-04-08 ENCOUNTER — Other Ambulatory Visit: Payer: Self-pay

## 2016-04-08 ENCOUNTER — Telehealth: Payer: Self-pay | Admitting: *Deleted

## 2016-04-08 ENCOUNTER — Ambulatory Visit: Payer: Self-pay

## 2016-04-08 ENCOUNTER — Encounter: Payer: Self-pay | Admitting: *Deleted

## 2016-04-08 DIAGNOSIS — I2102 ST elevation (STEMI) myocardial infarction involving left anterior descending coronary artery: Secondary | ICD-10-CM

## 2016-04-08 NOTE — Patient Outreach (Signed)
Transition of care:  Placed call to patient for transition of care.  Patient states that she went to the hospital with chest pain and was watched for observation. Reports all labs were negative and she was discharged the next day. Reports that she did not have a heart attack.  Patient reports that she is doing well. Reports her breathing is good.  Reports no weight loss.  Denies dizziness. Reports that home health is still active with her.  Reviewed discharge instructions:  Patient reports that she has good understanding of her instructions.  Reports she has all her medications.  Denies any new problems or concerns.  PLAN: Will continue weekly transition of care calls weekly.  Tomasa Rand, RN, BSN, CEN [facility] [employer] [phone]

## 2016-04-08 NOTE — Progress Notes (Signed)
Cardiac Individual Treatment Plan  Patient Details  Name: [name] MRN: [id number] Date of Birth: 1941/02/15 Referring Provider:   Flowsheet Row Cardiac Rehab from 03/09/2016 in [facility] Cardiac and Pulmonary Rehab  Referring Provider  Sherren Mocha MD      Initial Encounter Date:  Flowsheet Row Cardiac Rehab from 03/09/2016 in [facility] Cardiac and Pulmonary Rehab  Date  03/09/16  Referring Provider  Sherren Mocha MD      Visit Diagnosis: ST elevation myocardial infarction involving left anterior descending (LAD) coronary artery ([place])  Patient's Home Medications on Admission:  Current Outpatient Prescriptions:  .  albuterol (PROAIR HFA) 108 (90 Base) MCG/ACT inhaler, Inhale 2 puffs into the lungs every 4 (four) hours as needed for wheezing or shortness of breath., Disp: 1 Inhaler, Rfl: 1 .  aspirin EC 81 MG tablet, Take 1 tablet (81 mg total) by mouth daily., Disp: , Rfl:  .  budesonide (PULMICORT) 0.5 MG/2ML nebulizer solution, Take 2 mLs (0.5 mg total) by nebulization 2 (two) times daily., Disp: 120 mL, Rfl: 5 .  Calcium Carbonate-Vitamin D (CALCIUM-VITAMIN D) 500-200 MG-UNIT per tablet, Take 1 tablet by mouth 2 (two) times daily. , Disp: , Rfl:  .  Cholecalciferol (VITAMIN D) 2000 UNITS CAPS, Take 1 capsule by mouth daily., Disp: , Rfl:  .  clopidogrel (PLAVIX) 75 MG tablet, Take 1 tablet (75 mg total) by mouth daily., Disp: 30 tablet, Rfl: 12 .  denosumab (PROLIA) 60 MG/ML SOLN injection, Inject 60 mg into the skin every 6 (six) months. Administer in upper arm, thigh, or abdomen, Disp: 60 mL, Rfl: 1 .  furosemide (LASIX) 40 MG tablet, Take 0.5 tablets (20 mg total) by mouth daily., Disp: , Rfl:  .  ipratropium-albuterol (DUONEB) 0.5-2.5 (3) MG/3ML SOLN, Use 4 times a day and every 4 hrs. As needed, Disp: 360 mL, Rfl: 5 .  isosorbide mononitrate (IMDUR) 30 MG 24 hr tablet, TAKE 1 TABLET BY MOUTH DAILY, Disp: 30 tablet, Rfl: 1 .  LOTEMAX 0.5 % ophthalmic suspension, Place 1 drop  into both eyes daily. , Disp: , Rfl:  .  nitroGLYCERIN (NITROSTAT) 0.4 MG SL tablet, Place 1 tablet (0.4 mg total) under the tongue every 5 (five) minutes as needed for chest pain., Disp: 25 tablet, Rfl: 3 .  pantoprazole (PROTONIX) 40 MG tablet, Take 1 tablet (40 mg total) by mouth daily., Disp: 30 tablet, Rfl: 11 .  simvastatin (ZOCOR) 40 MG tablet, TAKE ONE TABLET BY MOUTH AT BEDTIME, Disp: 30 tablet, Rfl: 11 .  spironolactone (ALDACTONE) 25 MG tablet, TAKE 1/2  TABLET BY MOUTH ONCE DAILY., Disp: 30 tablet, Rfl: 6  Past Medical History: Past Medical History:  Diagnosis Date  . Arthritis   . CAD (coronary artery disease) 03/2009   s/p MI  . Chronic combined systolic and diastolic heart failure ([place]) 06/17/2009   Qualifier: Diagnosis of  By: Burt Knack, MD, Clayburn Pert   . CKD (chronic kidney disease) stage 3, GFR 30-59 ml/min   . Emphysema/COPD ([place])   . Ex-smoker quit 2008  . GERD (gastroesophageal reflux disease)   . Helicobacter pylori gastritis 10/2007   treated  . History of CVA (cerebrovascular accident) without residual deficits 01/2012, 06/2012   R hemorrhagic MCA 01/2012 with remote lacunar infarct L putamen and IC, rpt 06/2012 acute multifocal R MCA infarct with remote hemorrhagic strokes affecting L basal ganglia and periventricular white matter, full recovery  . HLD (hyperlipidemia)   . HTN (hypertension)   . Ischemic cardiomyopathy 02/27/2014  .  Lung cancer ([place]) dx'd 07/2006   Lung CA, s/p resection, followed by Dr Earlie Server  . Macular degeneration   . Myocardial infarction [facility]) 03/2009   Acute myocardial infarction 2010 - treated with BMS of LCx. LVEF 50% with subsequent CHF  . Osteoporosis 09/2013   T -3.6 forearm 09/2013, T -4.5 forearm 04/2015  . Stroke [facility])     Tobacco Use: History  Smoking Status  . Former Smoker  . Types: Cigarettes  Smokeless Tobacco  . Never Used    Labs: Recent Review Flowsheet Data    Labs for ITP Cardiac and Pulmonary Rehab Latest Ref  Rng & Units 03/28/2015 02/24/2016 02/24/2016 02/24/2016 03/18/2016   Cholestrol 0 - 200 mg/dL 140 113 99 - -   LDLCALC 0 - 99 mg/dL 71 49 47 - -   HDL >40 mg/dL 57.50 48 42 - -   Trlycerides <150 mg/dL 62.0 80 48 - -   Hemoglobin A1c 4.8 - 5.6 % 6.1 - 5.9(H) - -   PHART 7.350 - 7.400 - - - - -   PCO2ART 35.0 - 45.0 mmHg - - - - -   HCO3 20.0 - 24.0 mEq/L - - - - -   TCO2 0 - 100 mmol/L - - - 30 27   O2SAT % - - - - -       Exercise Target Goals:    Exercise Program Goal: Individual exercise prescription set with THRR, safety & activity barriers. Participant demonstrates ability to understand and report RPE using BORG scale, to self-measure pulse accurately, and to acknowledge the importance of the exercise prescription.  Exercise Prescription Goal: Starting with aerobic activity 30 plus minutes a day, 3 days per week for initial exercise prescription. Provide home exercise prescription and guidelines that participant acknowledges understanding prior to discharge.  Activity Barriers & Risk Stratification:     Activity Barriers & Cardiac Risk Stratification - 03/09/16 1355      Activity Barriers & Cardiac Risk Stratification   Activity Barriers Deconditioning;Shortness of Breath;Other (comment);Balance Concerns   Comments Sciatic nerve pain, occasional dizziness   Cardiac Risk Stratification High      6 Minute Walk:     6 Minute Walk    Row Name 03/09/16 1350         6 Minute Walk   Phase Initial     Distance 965 feet     Walk Time 6 minutes     # of Rest Breaks 0     MPH 1.83     METS 2.59     RPE 17     Perceived Dyspnea  3     VO2 Peak 9.06     Symptoms Yes (comment)     Comments Sciattic nerve pain, some SOB     Resting HR 58 bpm     Resting BP 130/74     Max Ex. HR 101 bpm     Max Ex. BP 134/74     2 Minute Post BP 128/64       Interval Oxygen   Interval Oxygen? Yes     6 Minute Oxygen Saturation % 95 %     6 Minute Liters of Oxygen 0 L  Room Air         Initial Exercise Prescription:     Initial Exercise Prescription - 03/09/16 1300      Date of Initial Exercise RX and Referring Provider   Date 03/09/16   Referring Provider Sherren Mocha MD  Treadmill   MPH 1.6   Grade 0.5   Minutes 15   METs 2.34     NuStep   Level 1  60-80 spm   Minutes 15   METs 2     Arm/Foot Ergometer   Level 1  25-40 rpm   Minutes 15   METs 2     Prescription Details   Frequency (times per week) 3   Duration Progress to 45 minutes of aerobic exercise without signs/symptoms of physical distress     Intensity   THRR 40-80% of Max Heartrate 93-128   Ratings of Perceived Exertion 11-15   Perceived Dyspnea 0-4     Progression   Progression Continue to progress workloads to maintain intensity without signs/symptoms of physical distress.     Resistance Training   Training Prescription Yes   Weight 2 lbs   Reps 10-15      Perform Capillary Blood Glucose checks as needed.  Exercise Prescription Changes:     Exercise Prescription Changes    Row Name 03/19/16 1300             Exercise Review   Progression Yes         Response to Exercise   Blood Pressure (Admit) 132/64       Blood Pressure (Exercise) 134/70       Blood Pressure (Exit) 96/60       Heart Rate (Admit) 83 bpm       Heart Rate (Exercise) 97 bpm       Heart Rate (Exit) 64 bpm         Progression   Progression Continue to progress workloads to maintain intensity without signs/symptoms of physical distress.       Average METs 2.44         Resistance Training   Training Prescription Yes       Weight 2       Reps 10-15         Interval Training   Interval Training No         Treadmill   MPH 1.4       Grade 0       Minutes 15         NuStep   Level 1       Minutes 15       METs 2         Arm/Foot Ergometer   Level 4       Minutes 15          Exercise Comments:     Exercise Comments    Row Name 03/09/16 1357 03/18/16 1719 03/19/16 1309  04/02/16 1107     Exercise Comments Ramie wants to be able to do what she wants and able to go walking again. First full day of exercise!  Patient was oriented to gym and equipment including functions, settings, policies, and procedures.  Patient's individual exercise prescription and treatment plan were reviewed.  All starting workloads were established based on the results of the 6 minute walk test done at initial orientation visit.  The plan for exercise progression was also introduced and progression will be customized based on patient's performance and goals.  Pt was dizzy coming off treadmill today, will continue to monitor. Ms Bangerter completed her first day of exercise today.  Equipment safety, infection control and exercise guidelines were reviewed. Ms Pearline Cables called and said she is at home now but is still weak and cant attend Heart  Track 9/6 or 7.       Discharge Exercise Prescription (Final Exercise Prescription Changes):     Exercise Prescription Changes - 03/19/16 1300      Exercise Review   Progression Yes     Response to Exercise   Blood Pressure (Admit) 132/64   Blood Pressure (Exercise) 134/70   Blood Pressure (Exit) 96/60   Heart Rate (Admit) 83 bpm   Heart Rate (Exercise) 97 bpm   Heart Rate (Exit) 64 bpm     Progression   Progression Continue to progress workloads to maintain intensity without signs/symptoms of physical distress.   Average METs 2.44     Resistance Training   Training Prescription Yes   Weight 2   Reps 10-15     Interval Training   Interval Training No     Treadmill   MPH 1.4   Grade 0   Minutes 15     NuStep   Level 1   Minutes 15   METs 2     Arm/Foot Ergometer   Level 4   Minutes 15      Nutrition:  Target Goals: Understanding of nutrition guidelines, daily intake of sodium '1500mg'$ , cholesterol '200mg'$ , calories 30% from fat and 7% or less from saturated fats, daily to have 5 or more servings of fruits and  vegetables.  Biometrics:     Pre Biometrics - 03/09/16 1356      Pre Biometrics   Height 5' 6.5" (1.689 m)   Weight 115 lb 6.4 oz (52.3 kg)   Waist Circumference 31 inches   Hip Circumference 37 inches   Waist to Hip Ratio 0.84 %   BMI (Calculated) 18.4   Single Leg Stand 4.12 seconds       Nutrition Therapy Plan and Nutrition Goals:     Nutrition Therapy & Goals - 03/19/16 0917      Nutrition Therapy   Diet low sodium, easy-to-chew diet for COPD, adeqate calories for weight gain   Protein (specify units) 7oz   Saturated Fats 12 max. grams   Fruits and Vegetables 5 servings/day   Sodium 1500 grams     Personal Nutrition Goals   Personal Goal #1 Eat something every 3 hours throughout the day   Personal Goal #2 Drink 1/2 - 1 bottle Ensure or Boost or other nutrition drink each day   Personal Goal #3 Eat soft foods like pudding, yogurt, mashed potatoes, lean ground or chopped meats   Comments Ms. Hodo reports poor appetite, and fatigue from chewing, some foods feel stuck in her throat after swallowing. Provided dietary tips for better breathing, and set goals to improve overall nutrition intake.      Intervention Plan   Intervention Nutrition handout(s) given to patient.   Expected Outcomes Short Term Goal: Understand basic principles of dietary content, such as calories, fat, sodium, cholesterol and nutrients.;Short Term Goal: A plan has been developed with personal nutrition goals set during dietitian appointment.;Long Term Goal: Adherence to prescribed nutrition plan.      Nutrition Discharge: Rate Your Plate Scores:     Nutrition Assessments - 03/09/16 1549      Rate Your Plate Scores   Pre Score 69   Pre Score % 76.6 %      Nutrition Goals Re-Evaluation:   Psychosocial: Target Goals: Acknowledge presence or absence of depression, maximize coping skills, provide positive support system. Participant is able to verbalize types and ability to use techniques  and skills needed for reducing stress  and depression.  Initial Review & Psychosocial Screening:     Initial Psych Review & Screening - 03/09/16 [place]? Yes   Comments Robyn has one son, 3 grandchildren, one brother and niece that are supportive.  Willow lists her brother as her emergency contact.  Nickole lives alone and has no church affiliation.  She was recently sitting with a patient, who has dementia.  This was just prior to her heart attack.  She described working with the demenia patient as stressful.  Hanny is not currently working.       Barriers   Psychosocial barriers to participate in program There are no identifiable barriers or psychosocial needs.     Screening Interventions   Interventions Encouraged to exercise;Program counselor consult      Quality of Life Scores:     Quality of Life - 03/09/16 1516      Quality of Life Scores   Health/Function Pre 16.97 %   Socioeconomic Pre 20.71 %   Psych/Spiritual Pre 19.93 %   Family Pre 19 %   GLOBAL Pre 18.65 %      PHQ-9: Recent Review Flowsheet Data    Depression screen [facility] 2/9 03/31/2016 03/09/2016 03/09/2016 01/08/2016 04/08/2015   Decreased Interest 0 0 0 0 0   Down, Depressed, Hopeless 0 0 0 0 0   PHQ - 2 Score 0 0 0 0 0   Altered sleeping - 0 - - -   Tired, decreased energy - 0 - - -   Change in appetite - 0 - - -   Feeling bad or failure about yourself  - 0 - - -   Trouble concentrating - 0 - - -   Moving slowly or fidgety/restless - 0 - - -   Suicidal thoughts - 0 - - -   PHQ-9 Score - 0 - - -   Difficult doing work/chores - Not difficult at all - - -      Psychosocial Evaluation and Intervention:   Psychosocial Re-Evaluation:   Vocational Rehabilitation: Provide vocational rehab assistance to qualifying candidates.   Vocational Rehab Evaluation & Intervention:     Vocational Rehab - 03/09/16 1522      Initial Vocational Rehab Evaluation & Intervention    Assessment shows need for Vocational Rehabilitation No      Education: Education Goals: Education classes will be provided on a weekly basis, covering required topics. Participant will state understanding/return demonstration of topics presented.  Learning Barriers/Preferences:     Learning Barriers/Preferences - 03/09/16 1523      Learning Barriers/Preferences   Learning Barriers Exercise Concerns   Learning Preferences Written Material      Education Topics: General Nutrition Guidelines/Fats and Fiber: -Group instruction provided by verbal, written material, models and posters to present the general guidelines for heart healthy nutrition. Gives an explanation and review of dietary fats and fiber.   Controlling Sodium/Reading Food Labels: -Group verbal and written material supporting the discussion of sodium use in heart healthy nutrition. Review and explanation with models, verbal and written materials for utilization of the food label.   Exercise Physiology & Risk Factors: - Group verbal and written instruction with models to review the exercise physiology of the cardiovascular system and associated critical values. Details cardiovascular disease risk factors and the goals associated with each risk factor.   Aerobic Exercise & Resistance Training: - Gives group verbal and written discussion on the health impact  of inactivity. On the components of aerobic and resistive training programs and the benefits of this training and how to safely progress through these programs.   Flexibility, Balance, General Exercise Guidelines: - Provides group verbal and written instruction on the benefits of flexibility and balance training programs. Provides general exercise guidelines with specific guidelines to those with heart or lung disease. Demonstration and skill practice provided.   Stress Management: - Provides group verbal and written instruction about the health risks of elevated  stress, cause of high stress, and healthy ways to reduce stress.   Depression: - Provides group verbal and written instruction on the correlation between heart/lung disease and depressed mood, treatment options, and the stigmas associated with seeking treatment.   Anatomy & Physiology of the Heart: - Group verbal and written instruction and models provide basic cardiac anatomy and physiology, with the coronary electrical and arterial systems. Review of: AMI, Angina, Valve disease, Heart Failure, Cardiac Arrhythmia, Pacemakers, and the ICD.   Cardiac Procedures: - Group verbal and written instruction and models to describe the testing methods done to diagnose heart disease. Reviews the outcomes of the test results. Describes the treatment choices: Medical Management, Angioplasty, or Coronary Bypass Surgery.   Cardiac Medications: - Group verbal and written instruction to review commonly prescribed medications for heart disease. Reviews the medication, class of the drug, and side effects. Includes the steps to properly store meds and maintain the prescription regimen.   Go Sex-Intimacy & Heart Disease, Get SMART - Goal Setting: - Group verbal and written instruction through game format to discuss heart disease and the return to sexual intimacy. Provides group verbal and written material to discuss and apply goal setting through the application of the S.M.A.R.T. Method.   Other Matters of the Heart: - Provides group verbal, written materials and models to describe Heart Failure, Angina, Valve Disease, and Diabetes in the realm of heart disease. Includes description of the disease process and treatment options available to the cardiac patient.   Exercise & Equipment Safety: - Individual verbal instruction and demonstration of equipment use and safety with use of the equipment. Flowsheet Row Cardiac Rehab from 03/09/2016 in [facility] Cardiac and Pulmonary Rehab  Date  03/09/16  Educator  D.  Joya Gaskins, RN  Instruction Review Code  1- partially meets, needs review/practice      Infection Prevention: - Provides verbal and written material to individual with discussion of infection control including proper hand washing and proper equipment cleaning during exercise session. Flowsheet Row Cardiac Rehab from 03/09/2016 in [facility] Cardiac and Pulmonary Rehab  Date  03/09/16  Educator  D. Joya Gaskins, RN  Instruction Review Code  2- meets goals/outcomes      Falls Prevention: - Provides verbal and written material to individual with discussion of falls prevention and safety. Flowsheet Row Cardiac Rehab from 03/09/2016 in [facility] Cardiac and Pulmonary Rehab  Date  03/09/16  Educator  D. Joya Gaskins, RN  Instruction Review Code  2- meets goals/outcomes      Diabetes: - Individual verbal and written instruction to review signs/symptoms of diabetes, desired ranges of glucose level fasting, after meals and with exercise. Advice that pre and post exercise glucose checks will be done for 3 sessions at entry of program.    Knowledge Questionnaire Score:     Knowledge Questionnaire Score - 03/09/16 1521      Knowledge Questionnaire Score   Pre Score 16/28      Core Components/Risk Factors/Patient Goals at Admission:     Personal  Goals and Risk Factors at Admission - 03/09/16 1527      Core Components/Risk Factors/Patient Goals on Admission    Weight Management Weight Gain  Mirabella does not want to lose any weight.  She would like to gain a few pounds.     Sedentary Yes   Intervention Provide advice, education, support and counseling about physical activity/exercise needs.;Develop an individualized exercise prescription for aerobic and resistive training based on initial evaluation findings, risk stratification, comorbidities and participant's personal goals.   Expected Outcomes Achievement of increased cardiorespiratory fitness and enhanced flexibility, muscular endurance and strength shown  through measurements of functional capacity and personal statement of participant.   Increase Strength and Stamina Yes   Intervention Provide advice, education, support and counseling about physical activity/exercise needs.;Develop an individualized exercise prescription for aerobic and resistive training based on initial evaluation findings, risk stratification, comorbidities and participant's personal goals.   Expected Outcomes Achievement of increased cardiorespiratory fitness and enhanced flexibility, muscular endurance and strength shown through measurements of functional capacity and personal statement of participant.   Improve shortness of breath with ADL's Yes   Intervention Provide education, individualized exercise plan and daily activity instruction to help decrease symptoms of SOB with activities of daily living.   Expected Outcomes Short Term: Achieves a reduction of symptoms when performing activities of daily living.   Hypertension Yes   Intervention Provide education on lifestyle modifcations including regular physical activity/exercise, weight management, moderate sodium restriction and increased consumption of fresh fruit, vegetables, and low fat dairy, alcohol moderation, and smoking cessation.;Monitor prescription use compliance.   Expected Outcomes Short Term: Continued assessment and intervention until BP is < 140/56m HG in hypertensive participants. < 130/882mHG in hypertensive participants with diabetes, heart failure or chronic kidney disease.;Long Term: Maintenance of blood pressure at goal levels.   Lipids Yes   Intervention Provide education and support for participant on nutrition & aerobic/resistive exercise along with prescribed medications to achieve LDL '70mg'$ , HDL >'40mg'$ .   Expected Outcomes Short Term: Participant states understanding of desired cholesterol values and is compliant with medications prescribed. Participant is following exercise prescription and nutrition  guidelines.;Long Term: Cholesterol controlled with medications as prescribed, with individualized exercise RX and with personalized nutrition plan. Value goals: LDL < '70mg'$ , HDL > 40 mg.      Core Components/Risk Factors/Patient Goals Review:    Core Components/Risk Factors/Patient Goals at Discharge (Final Review):    ITP Comments:     ITP Comments    Row Name 03/09/16 1533 03/11/16 0828 03/19/16 1310 04/02/16 1108 04/08/16 0701   ITP Comments WaMckayas a history of lung cancer.  She is SOB at times and stated right before her last heart attack she was very SOB.  WaAynsleeould like to increase her strength and stamina; decrease her SOB; maintain her current weight or gain a few pounds; control her blood pressure and lipids through exercise and the cardiac rehab program.  She denies any chest pain since her STEMI on 02/24/2016.   30 day review. Continue with ITP unless changes noted by Medical Director at signature of review. Ms Pettie completed her first day of exercise today.  Equipment safety, infection control and exercise guidelines were reviewed. Ms GrPearline Cablesalled and said she is at home now but is still weak and cant attend Heart Track 9/6 or 7. 30 day review. Continue with ITP unless changes noted by Medical Director at signature of review.      Comments:

## 2016-04-08 NOTE — Telephone Encounter (Signed)
[name] just called and said she is sorry but she can't attend Cardiac Rehab today since "her doctor wants to see her on Sept 18th. " I asked her what he wanted to see her for and [name] said he just wants to see me.

## 2016-04-08 NOTE — Telephone Encounter (Signed)
Transition Care Management Follow-up Telephone Call    Date discharged? 04/06/2016  How have you been since you were released from the hospital? [place]. Improving.   Any patient concerns? None at this time    Items Reviewed:  Medications reviewed: Yes  Allergies reviewed: Yes  Dietary changes reviewed: Yes  Referrals reviewed: Yes, cardiology and cardiac rehab   Functional Questionnaire:  Independent - I Dependent - D    Activities of Daily Living (ADLs):    Personal hygiene - I Dressing - I Eating - I Maintaining continence - I Transferring - I (uses walker for transfers and ambulation)  Independent Activities of Daily Living (iADLs): Basic communication skills - I Transportation - D (pt is on temporary driving restrictions) Meal preparation  - I Shopping - D (temporary) Housework - I  Managing medications - I  Managing personal finances - I   Confirmed importance and date/time of follow-up visits scheduled YES  Provider Appointment booked with PCP 04/16/16 @ 1200  Confirmed with patient if condition begins to worsen call PCP or go to the ER.  Patient was given the office number and encouraged to call back with question or concerns: YES

## 2016-04-09 NOTE — Telephone Encounter (Signed)
I spoke with the pt and have scheduled her to see Dr Burt Knack on 04/13/16.

## 2016-04-10 DIAGNOSIS — J441 Chronic obstructive pulmonary disease with (acute) exacerbation: Secondary | ICD-10-CM | POA: Diagnosis not present

## 2016-04-10 DIAGNOSIS — J440 Chronic obstructive pulmonary disease with acute lower respiratory infection: Secondary | ICD-10-CM | POA: Diagnosis not present

## 2016-04-10 DIAGNOSIS — I5042 Chronic combined systolic (congestive) and diastolic (congestive) heart failure: Secondary | ICD-10-CM | POA: Diagnosis not present

## 2016-04-10 DIAGNOSIS — Z87891 Personal history of nicotine dependence: Secondary | ICD-10-CM | POA: Diagnosis not present

## 2016-04-10 DIAGNOSIS — I255 Ischemic cardiomyopathy: Secondary | ICD-10-CM | POA: Diagnosis not present

## 2016-04-10 DIAGNOSIS — I110 Hypertensive heart disease with heart failure: Secondary | ICD-10-CM | POA: Diagnosis not present

## 2016-04-10 DIAGNOSIS — E785 Hyperlipidemia, unspecified: Secondary | ICD-10-CM | POA: Diagnosis not present

## 2016-04-10 DIAGNOSIS — I2510 Atherosclerotic heart disease of native coronary artery without angina pectoris: Secondary | ICD-10-CM | POA: Diagnosis not present

## 2016-04-10 DIAGNOSIS — J189 Pneumonia, unspecified organism: Secondary | ICD-10-CM | POA: Diagnosis not present

## 2016-04-10 DIAGNOSIS — C3431 Malignant neoplasm of lower lobe, right bronchus or lung: Secondary | ICD-10-CM | POA: Diagnosis not present

## 2016-04-10 DIAGNOSIS — E440 Moderate protein-calorie malnutrition: Secondary | ICD-10-CM | POA: Diagnosis not present

## 2016-04-10 DIAGNOSIS — Z7982 Long term (current) use of aspirin: Secondary | ICD-10-CM | POA: Diagnosis not present

## 2016-04-10 DIAGNOSIS — Z7951 Long term (current) use of inhaled steroids: Secondary | ICD-10-CM | POA: Diagnosis not present

## 2016-04-10 DIAGNOSIS — Z7901 Long term (current) use of anticoagulants: Secondary | ICD-10-CM | POA: Diagnosis not present

## 2016-04-11 ENCOUNTER — Other Ambulatory Visit: Payer: Self-pay | Admitting: Cardiovascular Disease

## 2016-04-13 ENCOUNTER — Ambulatory Visit (INDEPENDENT_AMBULATORY_CARE_PROVIDER_SITE_OTHER): Payer: PPO | Admitting: Cardiovascular Disease

## 2016-04-13 ENCOUNTER — Ambulatory Visit: Payer: PPO | Admitting: Physician Assistant

## 2016-04-13 VITALS — BP 140/60 | HR 64 | Ht 66.0 in | Wt 116.4 lb

## 2016-04-13 DIAGNOSIS — I10 Essential (primary) hypertension: Secondary | ICD-10-CM

## 2016-04-13 DIAGNOSIS — I5022 Chronic systolic (congestive) heart failure: Secondary | ICD-10-CM | POA: Diagnosis not present

## 2016-04-13 DIAGNOSIS — E785 Hyperlipidemia, unspecified: Secondary | ICD-10-CM | POA: Diagnosis not present

## 2016-04-13 NOTE — Patient Instructions (Signed)
Medication Instructions:  Your physician recommends that you continue on your current medications as directed. Please refer to the Current Medication list given to you today.  Labwork: Your physician recommends that you return for lab work in 8 WEEKS: BMP and BNP   Testing/Procedures: No new orders.   Follow-Up: Your physician recommends that you schedule a follow-up appointment in: [place] with Richardson Dopp PA-C  Your physician wants you to follow-up in: 6 MONTHS with Dr Burt Knack.  You will receive a reminder letter in the mail two months in advance. If you don't receive a letter, please call our office to schedule the follow-up appointment.   Any Other Special Instructions Will Be Listed Below (If Applicable).  Okay to STOP cardiac rehabilitation.      If you need a refill on your cardiac medications before your next appointment, please call your pharmacy.

## 2016-04-13 NOTE — Progress Notes (Signed)
Cardiology Office Note Date:  04/13/2016   ID:  [name], DOB May 05, 1941, MRN [id number]  PCP:  Jodi Bush, MD  Cardiologist:  Jodi Mocha, MD    Chief Complaint  Patient presents with  . Coronary Artery Disease     History of Present Illness: [name] is a 75 y.o. female who presents for hospital FU evaluation. The patient was recently evaluated for chest pain felt to be noncardiac. She has a history of coronary artery disease and chronic systolic heart failure. She initially presented with an acute MI in 2010 with left circumflex occlusion treated with primary PCI using a bare-metal stent. The patient also has a history of right MCA stroke in 2013. She's had non-small cell lung cancer and underwent curative resection several years ago. She also has COPD.  In July 2017 she presented with an anterior STEMI and was found to have apical occlusion of the LAD. Medical therapy was recommended. She returned in August with shortness of breath and chest pain but her symptoms were felt to be noncardiac.  She returns today with her son. She feels like doing better. She's had no recurrent chest pain. Her breathing is stable and chronic dyspnea unchanged at this time. She denies edema, orthopnea, or PND. 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 Feels like alleviating the stress is helped a lot.   Past Medical History:  Diagnosis Date  . Arthritis   . CAD (coronary artery disease) 03/2009   s/p MI  . Chronic combined systolic and diastolic heart failure ([place]) 06/17/2009   Qualifier: Diagnosis of  By: Burt Knack, MD, [place] Pert   . CKD (chronic kidney disease) stage 3, GFR 30-59 ml/min   . Emphysema/COPD ([place])   . Ex-smoker quit 2008  . GERD (gastroesophageal reflux disease)   . Helicobacter pylori gastritis 10/2007   treated  . History of CVA (cerebrovascular accident) without residual deficits  01/2012, 06/2012   R hemorrhagic MCA 01/2012 with remote lacunar infarct L putamen and IC, rpt 06/2012 acute multifocal R MCA infarct with remote hemorrhagic strokes affecting L basal ganglia and periventricular white matter, full recovery  . HLD (hyperlipidemia)   . HTN (hypertension)   . Ischemic cardiomyopathy 02/27/2014  . Lung cancer ([place]) dx'd 07/2006   Lung CA, s/p resection, followed by Dr Earlie Server  . Macular degeneration   . Myocardial infarction [facility]) 03/2009   Acute myocardial infarction 2010 - treated with BMS of LCx. LVEF 50% with subsequent CHF  . Osteoporosis 09/2013   T -3.6 forearm 09/2013, T -4.5 forearm 04/2015  . Stroke [facility])     Past Surgical History:  Procedure Laterality Date  . BREAST BIOPSY Left   . CARDIAC CATHETERIZATION N/A 02/24/2016   Procedure: Left Heart Cath and Coronary Angiography;  Surgeon: Belva Crome, MD;  Location: [facility];  Service: Cardiovascular;  Laterality: N/A;  . CATARACT EXTRACTION Bilateral   . CORONARY STENT PLACEMENT    . EYE SURGERY    . LUNG REMOVAL, PARTIAL Right 2008  . PARTIAL HYSTERECTOMY  1986   irregular periods, ovaries remain    Current Outpatient Prescriptions  Medication Sig Dispense Refill  . albuterol (PROAIR HFA) 108 (90 Base) MCG/ACT inhaler Inhale 2 puffs into the lungs every 4 (four) hours as needed for wheezing or shortness of breath. 1 Inhaler 1  . aspirin EC 81 MG tablet Take 1 tablet (81 mg  total) by mouth daily.    . budesonide (PULMICORT) 0.5 MG/2ML nebulizer solution Take 2 mLs (0.5 mg total) by nebulization 2 (two) times daily. 120 mL 5  . Calcium Carbonate-Vitamin D (CALCIUM-VITAMIN D) 500-200 MG-UNIT per tablet Take 1 tablet by mouth 2 (two) times daily.     . Cholecalciferol (VITAMIN D) 2000 UNITS CAPS Take 1 capsule by mouth daily.    . clopidogrel (PLAVIX) 75 MG tablet Take 1 tablet (75 mg total) by mouth daily. 30 tablet 12  . denosumab (PROLIA) 60 MG/ML SOLN injection Inject 60 mg into the skin  every 6 (six) months. Administer in upper arm, thigh, or abdomen 60 mL 1  . furosemide (LASIX) 40 MG tablet Take 0.5 tablets (20 mg total) by mouth daily.    Marland Kitchen ipratropium-albuterol (DUONEB) 0.5-2.5 (3) MG/3ML SOLN Take 3 mLs by nebulization 2 (two) times daily.    . isosorbide mononitrate (IMDUR) 30 MG 24 hr tablet TAKE 1 TABLET BY MOUTH DAILY 30 tablet 1  . LOTEMAX 0.5 % ophthalmic suspension Place 1 drop into both eyes daily.     . nitroGLYCERIN (NITROSTAT) 0.4 MG SL tablet Place 1 tablet (0.4 mg total) under the tongue every 5 (five) minutes as needed for chest pain. 25 tablet 3  . pantoprazole (PROTONIX) 40 MG tablet Take 1 tablet (40 mg total) by mouth daily. 30 tablet 11  . simvastatin (ZOCOR) 40 MG tablet TAKE ONE TABLET BY MOUTH AT BEDTIME 30 tablet 11  . spironolactone (ALDACTONE) 25 MG tablet TAKE 1/2  TABLET BY MOUTH ONCE DAILY. 30 tablet 6   No current facility-administered medications for this visit.     Allergies:   Actonel [risedronate sodium]; Amlodipine; Fosamax [alendronate sodium]; Lisinopril; Pravastatin; and Codeine   Social History:  The patient  reports that she has quit smoking. Her smoking use included Cigarettes. She has never used smokeless tobacco. She reports that she does not drink alcohol or use drugs.   Family History:  The patient's  family history includes Breast cancer in her sister; CAD (age of onset: 44) in her father and mother; Diabetes in her sister; Heart attack in her mother; Hypertension in her mother; Stroke in her father and mother.    ROS:  Please see the history of present illness.  Otherwise, review of systems is positive for nausea, anxiety.  All other systems are reviewed and negative.    PHYSICAL EXAM: VS:  BP 140/60   Pulse 64   Ht '5\' 6"'$  (1.676 m)   Wt 52.8 kg (116 lb 6.4 oz)   BMI 18.79 kg/m  , BMI Body mass index is 18.79 kg/m. GEN: Well nourished, well developed, in no acute distress  HEENT: normal  Neck: no JVD, no masses. No  carotid bruits Cardiac: RRR without murmur or gallop                Respiratory:  clear to auscultation bilaterally, normal work of breathing, decreased air movement bilaterally GI: soft, nontender, nondistended, + BS MS: no deformity or atrophy  Ext: no pretibial edema, pedal pulses 2+= bilaterally Skin: warm and dry, no rash Neuro:  Strength and sensation are intact Psych: euthymic mood, full affect  EKG:  EKG is not ordered today.  Recent Labs: 02/26/2016: Magnesium 2.0 04/06/2016: ALT 17; BUN 14; Creatinine, Ser 0.80; Hemoglobin 11.0; Platelets 208; Potassium 4.0; Sodium 137   Lipid Panel     Component Value Date/Time   CHOL 99 02/24/2016 0356   TRIG 48 02/24/2016  0356   HDL 42 02/24/2016 0356   CHOLHDL 2.4 02/24/2016 0356   VLDL 10 02/24/2016 0356   LDLCALC 47 02/24/2016 0356      Wt Readings from Last 3 Encounters:  04/13/16 52.8 kg (116 lb 6.4 oz)  04/08/16 51.7 kg (114 lb)  04/06/16 52.5 kg (115 lb 11.2 oz)     Cardiac Studies Reviewed: Cardiac Cath 02-24-2016: Conclusion     Prox LAD lesion, 65 %stenosed.  Ost 1st Diag lesion, 65 %stenosed.  Prox Cx to Mid Cx lesion, 10 %stenosed.  The left ventricular ejection fraction is 35-45% by visual estimate.  LV end diastolic pressure is normal.  There is moderate left ventricular systolic dysfunction.  Dist LAD lesion, 100 %stenosed.    Acute coronary syndrome with occlusion of distal LAD in a small tortuous segment.  Widely patent stent in the circumflex.  Eccentric 65% stenosis in the mid LAD beyond the first agonal. 60% first diagonal ostial stenosis. Both sites are unchanged from prior angiogram. New finding of very distal LAD total occlusion.  Dominant right coronary artery with luminal irregularities.  Moderate left ventricular systolic dysfunction with regional wall motion abnormality including new apical severe hypokinesis and suggestion of anterolateral wall akinesis/dyskinesis (old). Estimated  ejection fraction is 35%.  LVEDP is low normal.  RECOMMENDATIONS:   Serial cardiac markers  Add Plavix to medical regimen.  IV hydration  Risk factor modification  Anticoagulation   Echo 03-20-2016: Study Conclusions  - Left ventricle: The cavity size was mildly dilated. Wall   thickness was normal. Systolic function was mildly to moderately   reduced. The estimated ejection fraction was in the range of 40%   to 45%. Severe hypokinesis of the basal-midanterolateral and   inferolateral myocardium; consistent with infarction in the   distribution of the left circumflex coronary artery. Features are   consistent with a pseudonormal left ventricular filling pattern,   with concomitant abnormal relaxation and increased filling   pressure (grade 2 diastolic dysfunction). - Mitral valve: Calcified annulus. There was mild regurgitation. - Left atrium: The atrium was mildly dilated. - Pulmonary arteries: Systolic pressure was mildly increased. PA   peak pressure: 45 mm Hg (S).  ASSESSMENT AND PLAN: 1.  Chronic systolic heart failure, NYHA functional class II: Medications are reviewed and will be continued without change. The patient seems to be tolerating furosemide, isosorbide, and Spirinolactone. Recent echo reviewed. She's not been able to tolerate a beta blocker or ACE inhibitor well with her chronic lung disease. She's been tried on carvedilol and a cardioselective beta blocker but has not done well on either one.  2. Coronary artery disease, native vessel: She'll continue dual antiplatelet therapy with aspirin and Plavix and a statin drug with simvastatin. No angina at present.  3. Hypertension: Blood pressure is controlled  Current medicines are reviewed with the patient today.  The patient does not have concerns regarding medicines.  Labs/ tests ordered today include:  No orders of the defined types were placed in this encounter.   Disposition:   FU 6  months  Signed, Jodi Mocha, MD  04/13/2016 12:10 PM    [facility] [place], [employer], [place]  [address] Phone: [phone]; Fax: [phone]

## 2016-04-14 ENCOUNTER — Encounter: Payer: Self-pay | Admitting: *Deleted

## 2016-04-14 ENCOUNTER — Other Ambulatory Visit: Payer: Self-pay

## 2016-04-14 ENCOUNTER — Telehealth: Payer: Self-pay | Admitting: *Deleted

## 2016-04-14 DIAGNOSIS — I2102 ST elevation (STEMI) myocardial infarction involving left anterior descending coronary artery: Secondary | ICD-10-CM

## 2016-04-14 NOTE — Patient Outreach (Signed)
Transition of care call: Placed call to patient who answered. Reports that she is doing well. Reports no new problems or concerns. States that her weight has been 114-115 over the last week. Reports that she saw her cardiologist yesterday and everything was " fine" Reports no issues with breathing. Reports she continues to take her medications as prescribed.  PLAN: Will continue weekly transition of care calls. Reviewed with patient to call sooner if needed.  Tomasa Rand, RN, BSN, CEN [facility] [employer] [phone]

## 2016-04-14 NOTE — Telephone Encounter (Signed)
-----   Message from Magda Kiel, RN sent at 04/13/2016  4:33 PM EDT ----- Regarding: RE: DC cardiac rehab Thanks Ander Purpura will you let Banks know? She is scheduled at [facility]. I forwarded it to [place].  Have a good afternoon!  Verdis Frederickson    ----- Message ----- From: Barkley Boards, RN Sent: 04/13/2016   2:40 PM To: Magda Kiel, RN Subject: DC cardiac rehab                               Ronnald Nian,  I am not sure who to send this to but we are discontinuing this pt's cardiac rehab.  If you can just make the appropriate person aware.  Thank you, Ander Purpura

## 2016-04-14 NOTE — Progress Notes (Signed)
Cardiac Individual Treatment Plan  Patient Details  Name: [name] MRN: [id number] Date of Birth: 12/19/1940 Referring Provider:   Flowsheet Row Cardiac Rehab from 03/09/2016 in [facility] Cardiac and Pulmonary Rehab  Referring Provider  Sherren Mocha MD      Initial Encounter Date:  Flowsheet Row Cardiac Rehab from 03/09/2016 in [facility] Cardiac and Pulmonary Rehab  Date  03/09/16  Referring Provider  Sherren Mocha MD      Visit Diagnosis: ST elevation myocardial infarction involving left anterior descending (LAD) coronary artery ([place])  Patient's Home Medications on Admission:  Current Outpatient Prescriptions:  .  albuterol (PROAIR HFA) 108 (90 Base) MCG/ACT inhaler, Inhale 2 puffs into the lungs every 4 (four) hours as needed for wheezing or shortness of breath., Disp: 1 Inhaler, Rfl: 1 .  aspirin EC 81 MG tablet, Take 1 tablet (81 mg total) by mouth daily., Disp: , Rfl:  .  budesonide (PULMICORT) 0.5 MG/2ML nebulizer solution, Take 2 mLs (0.5 mg total) by nebulization 2 (two) times daily., Disp: 120 mL, Rfl: 5 .  Calcium Carbonate-Vitamin D (CALCIUM-VITAMIN D) 500-200 MG-UNIT per tablet, Take 1 tablet by mouth 2 (two) times daily. , Disp: , Rfl:  .  Cholecalciferol (VITAMIN D) 2000 UNITS CAPS, Take 1 capsule by mouth daily., Disp: , Rfl:  .  clopidogrel (PLAVIX) 75 MG tablet, Take 1 tablet (75 mg total) by mouth daily., Disp: 30 tablet, Rfl: 12 .  denosumab (PROLIA) 60 MG/ML SOLN injection, Inject 60 mg into the skin every 6 (six) months. Administer in upper arm, thigh, or abdomen, Disp: 60 mL, Rfl: 1 .  furosemide (LASIX) 40 MG tablet, Take 0.5 tablets (20 mg total) by mouth daily., Disp: , Rfl:  .  ipratropium-albuterol (DUONEB) 0.5-2.5 (3) MG/3ML SOLN, Take 3 mLs by nebulization 2 (two) times daily., Disp: , Rfl:  .  isosorbide mononitrate (IMDUR) 30 MG 24 hr tablet, TAKE 1 TABLET BY MOUTH DAILY, Disp: 30 tablet, Rfl: 1 .  LOTEMAX 0.5 % ophthalmic suspension, Place 1 drop into  both eyes daily. , Disp: , Rfl:  .  nitroGLYCERIN (NITROSTAT) 0.4 MG SL tablet, Place 1 tablet (0.4 mg total) under the tongue every 5 (five) minutes as needed for chest pain., Disp: 25 tablet, Rfl: 3 .  pantoprazole (PROTONIX) 40 MG tablet, Take 1 tablet (40 mg total) by mouth daily., Disp: 30 tablet, Rfl: 11 .  simvastatin (ZOCOR) 40 MG tablet, TAKE ONE TABLET BY MOUTH AT BEDTIME, Disp: 30 tablet, Rfl: 11 .  spironolactone (ALDACTONE) 25 MG tablet, TAKE 1/2 TABLET BY MOUTH ONCE DAILY, Disp: 30 tablet, Rfl: 11  Past Medical History: Past Medical History:  Diagnosis Date  . Arthritis   . CAD (coronary artery disease) 03/2009   s/p MI  . Chronic combined systolic and diastolic heart failure ([place]) 06/17/2009   Qualifier: Diagnosis of  By: Burt Knack, MD, Clayburn Pert   . CKD (chronic kidney disease) stage 3, GFR 30-59 ml/min   . Emphysema/COPD ([place])   . Ex-smoker quit 2008  . GERD (gastroesophageal reflux disease)   . Helicobacter pylori gastritis 10/2007   treated  . History of CVA (cerebrovascular accident) without residual deficits 01/2012, 06/2012   R hemorrhagic MCA 01/2012 with remote lacunar infarct L putamen and IC, rpt 06/2012 acute multifocal R MCA infarct with remote hemorrhagic strokes affecting L basal ganglia and periventricular white matter, full recovery  . HLD (hyperlipidemia)   . HTN (hypertension)   . Ischemic cardiomyopathy 02/27/2014  . Lung cancer ([place])  dx'd 07/2006   Lung CA, s/p resection, followed by Dr Earlie Server  . Macular degeneration   . Myocardial infarction [facility]) 03/2009   Acute myocardial infarction 2010 - treated with BMS of LCx. LVEF 50% with subsequent CHF  . Osteoporosis 09/2013   T -3.6 forearm 09/2013, T -4.5 forearm 04/2015  . Stroke [facility])     Tobacco Use: History  Smoking Status  . Former Smoker  . Types: Cigarettes  Smokeless Tobacco  . Never Used    Labs: Recent Review Flowsheet Data    Labs for ITP Cardiac and Pulmonary Rehab Latest Ref Rng &  Units 03/28/2015 02/24/2016 02/24/2016 02/24/2016 03/18/2016   Cholestrol 0 - 200 mg/dL 140 113 99 - -   LDLCALC 0 - 99 mg/dL 71 49 47 - -   HDL >40 mg/dL 57.50 48 42 - -   Trlycerides <150 mg/dL 62.0 80 48 - -   Hemoglobin A1c 4.8 - 5.6 % 6.1 - 5.9(H) - -   PHART 7.350 - 7.400 - - - - -   PCO2ART 35.0 - 45.0 mmHg - - - - -   HCO3 20.0 - 24.0 mEq/L - - - - -   TCO2 0 - 100 mmol/L - - - 30 27   O2SAT % - - - - -       Exercise Target Goals:    Exercise Program Goal: Individual exercise prescription set with THRR, safety & activity barriers. Participant demonstrates ability to understand and report RPE using BORG scale, to self-measure pulse accurately, and to acknowledge the importance of the exercise prescription.  Exercise Prescription Goal: Starting with aerobic activity 30 plus minutes a day, 3 days per week for initial exercise prescription. Provide home exercise prescription and guidelines that participant acknowledges understanding prior to discharge.  Activity Barriers & Risk Stratification:     Activity Barriers & Cardiac Risk Stratification - 03/09/16 1355      Activity Barriers & Cardiac Risk Stratification   Activity Barriers Deconditioning;Shortness of Breath;Other (comment);Balance Concerns   Comments Sciatic nerve pain, occasional dizziness   Cardiac Risk Stratification High      6 Minute Walk:     6 Minute Walk    Row Name 03/09/16 1350         6 Minute Walk   Phase Initial     Distance 965 feet     Walk Time 6 minutes     # of Rest Breaks 0     MPH 1.83     METS 2.59     RPE 17     Perceived Dyspnea  3     VO2 Peak 9.06     Symptoms Yes (comment)     Comments Sciattic nerve pain, some SOB     Resting HR 58 bpm     Resting BP 130/74     Max Ex. HR 101 bpm     Max Ex. BP 134/74     2 Minute Post BP 128/64       Interval Oxygen   Interval Oxygen? Yes     6 Minute Oxygen Saturation % 95 %     6 Minute Liters of Oxygen 0 L  Room Air         Initial Exercise Prescription:     Initial Exercise Prescription - 03/09/16 1300      Date of Initial Exercise RX and Referring Provider   Date 03/09/16   Referring Provider Sherren Mocha MD  Treadmill   MPH 1.6   Grade 0.5   Minutes 15   METs 2.34     NuStep   Level 1  60-80 spm   Minutes 15   METs 2     Arm/Foot Ergometer   Level 1  25-40 rpm   Minutes 15   METs 2     Prescription Details   Frequency (times per week) 3   Duration Progress to 45 minutes of aerobic exercise without signs/symptoms of physical distress     Intensity   THRR 40-80% of Max Heartrate 93-128   Ratings of Perceived Exertion 11-15   Perceived Dyspnea 0-4     Progression   Progression Continue to progress workloads to maintain intensity without signs/symptoms of physical distress.     Resistance Training   Training Prescription Yes   Weight 2 lbs   Reps 10-15      Perform Capillary Blood Glucose checks as needed.  Exercise Prescription Changes:     Exercise Prescription Changes    Row Name 03/19/16 1300             Exercise Review   Progression Yes         Response to Exercise   Blood Pressure (Admit) 132/64       Blood Pressure (Exercise) 134/70       Blood Pressure (Exit) 96/60       Heart Rate (Admit) 83 bpm       Heart Rate (Exercise) 97 bpm       Heart Rate (Exit) 64 bpm         Progression   Progression Continue to progress workloads to maintain intensity without signs/symptoms of physical distress.       Average METs 2.44         Resistance Training   Training Prescription Yes       Weight 2       Reps 10-15         Interval Training   Interval Training No         Treadmill   MPH 1.4       Grade 0       Minutes 15         NuStep   Level 1       Minutes 15       METs 2         Arm/Foot Ergometer   Level 4       Minutes 15          Exercise Comments:     Exercise Comments    Row Name 03/09/16 1357 03/18/16 1719 03/19/16 1309  04/02/16 1107     Exercise Comments Sylvanna wants to be able to do what she wants and able to go walking again. First full day of exercise!  Patient was oriented to gym and equipment including functions, settings, policies, and procedures.  Patient's individual exercise prescription and treatment plan were reviewed.  All starting workloads were established based on the results of the 6 minute walk test done at initial orientation visit.  The plan for exercise progression was also introduced and progression will be customized based on patient's performance and goals.  Pt was dizzy coming off treadmill today, will continue to monitor. Ms Gargan completed her first day of exercise today.  Equipment safety, infection control and exercise guidelines were reviewed. Ms Pearline Cables called and said she is at home now but is still weak and cant attend Heart  Track 9/6 or 7.       Discharge Exercise Prescription (Final Exercise Prescription Changes):     Exercise Prescription Changes - 03/19/16 1300      Exercise Review   Progression Yes     Response to Exercise   Blood Pressure (Admit) 132/64   Blood Pressure (Exercise) 134/70   Blood Pressure (Exit) 96/60   Heart Rate (Admit) 83 bpm   Heart Rate (Exercise) 97 bpm   Heart Rate (Exit) 64 bpm     Progression   Progression Continue to progress workloads to maintain intensity without signs/symptoms of physical distress.   Average METs 2.44     Resistance Training   Training Prescription Yes   Weight 2   Reps 10-15     Interval Training   Interval Training No     Treadmill   MPH 1.4   Grade 0   Minutes 15     NuStep   Level 1   Minutes 15   METs 2     Arm/Foot Ergometer   Level 4   Minutes 15      Nutrition:  Target Goals: Understanding of nutrition guidelines, daily intake of sodium '1500mg'$ , cholesterol '200mg'$ , calories 30% from fat and 7% or less from saturated fats, daily to have 5 or more servings of fruits and  vegetables.  Biometrics:     Pre Biometrics - 03/09/16 1356      Pre Biometrics   Height 5' 6.5" (1.689 m)   Weight 115 lb 6.4 oz (52.3 kg)   Waist Circumference 31 inches   Hip Circumference 37 inches   Waist to Hip Ratio 0.84 %   BMI (Calculated) 18.4   Single Leg Stand 4.12 seconds       Nutrition Therapy Plan and Nutrition Goals:     Nutrition Therapy & Goals - 03/19/16 0917      Nutrition Therapy   Diet low sodium, easy-to-chew diet for COPD, adeqate calories for weight gain   Protein (specify units) 7oz   Saturated Fats 12 max. grams   Fruits and Vegetables 5 servings/day   Sodium 1500 grams     Personal Nutrition Goals   Personal Goal #1 Eat something every 3 hours throughout the day   Personal Goal #2 Drink 1/2 - 1 bottle Ensure or Boost or other nutrition drink each day   Personal Goal #3 Eat soft foods like pudding, yogurt, mashed potatoes, lean ground or chopped meats   Comments Ms. Charney reports poor appetite, and fatigue from chewing, some foods feel stuck in her throat after swallowing. Provided dietary tips for better breathing, and set goals to improve overall nutrition intake.      Intervention Plan   Intervention Nutrition handout(s) given to patient.   Expected Outcomes Short Term Goal: Understand basic principles of dietary content, such as calories, fat, sodium, cholesterol and nutrients.;Short Term Goal: A plan has been developed with personal nutrition goals set during dietitian appointment.;Long Term Goal: Adherence to prescribed nutrition plan.      Nutrition Discharge: Rate Your Plate Scores:     Nutrition Assessments - 03/09/16 1549      Rate Your Plate Scores   Pre Score 69   Pre Score % 76.6 %      Nutrition Goals Re-Evaluation:   Psychosocial: Target Goals: Acknowledge presence or absence of depression, maximize coping skills, provide positive support system. Participant is able to verbalize types and ability to use techniques  and skills needed for reducing stress  and depression.  Initial Review & Psychosocial Screening:     Initial Psych Review & Screening - 03/09/16 [place]? Yes   Comments Kristie has one son, 3 grandchildren, one brother and niece that are supportive.  Leliana lists her brother as her emergency contact.  Jakala lives alone and has no church affiliation.  She was recently sitting with a patient, who has dementia.  This was just prior to her heart attack.  She described working with the demenia patient as stressful.  Jazmarie is not currently working.       Barriers   Psychosocial barriers to participate in program There are no identifiable barriers or psychosocial needs.     Screening Interventions   Interventions Encouraged to exercise;Program counselor consult      Quality of Life Scores:     Quality of Life - 03/09/16 1516      Quality of Life Scores   Health/Function Pre 16.97 %   Socioeconomic Pre 20.71 %   Psych/Spiritual Pre 19.93 %   Family Pre 19 %   GLOBAL Pre 18.65 %      PHQ-9: Recent Review Flowsheet Data    Depression screen [facility] 2/9 03/31/2016 03/09/2016 03/09/2016 01/08/2016 04/08/2015   Decreased Interest 0 0 0 0 0   Down, Depressed, Hopeless 0 0 0 0 0   PHQ - 2 Score 0 0 0 0 0   Altered sleeping - 0 - - -   Tired, decreased energy - 0 - - -   Change in appetite - 0 - - -   Feeling bad or failure about yourself  - 0 - - -   Trouble concentrating - 0 - - -   Moving slowly or fidgety/restless - 0 - - -   Suicidal thoughts - 0 - - -   PHQ-9 Score - 0 - - -   Difficult doing work/chores - Not difficult at all - - -      Psychosocial Evaluation and Intervention:   Psychosocial Re-Evaluation:     Psychosocial Re-Evaluation    Row Name 04/08/16 1016             Psychosocial Re-Evaluation   Interventions Encouraged to attend Cardiac Rehabilitation for the exercise       Comments Quinesha Pelster just called and said she is  sorry but she can't attend Cardiac Rehab today since "her doctor wants to see her on Sept 18th. " I asked her what he wanted to see her for and Starlit said he just wants to see me.           Vocational Rehabilitation: Provide vocational rehab assistance to qualifying candidates.   Vocational Rehab Evaluation & Intervention:     Vocational Rehab - 03/09/16 1522      Initial Vocational Rehab Evaluation & Intervention   Assessment shows need for Vocational Rehabilitation No      Education: Education Goals: Education classes will be provided on a weekly basis, covering required topics. Participant will state understanding/return demonstration of topics presented.  Learning Barriers/Preferences:     Learning Barriers/Preferences - 03/09/16 1523      Learning Barriers/Preferences   Learning Barriers Exercise Concerns   Learning Preferences Written Material      Education Topics: General Nutrition Guidelines/Fats and Fiber: -Group instruction provided by verbal, written material, models and posters to present the general guidelines for heart healthy nutrition. Gives an explanation and review of  dietary fats and fiber.   Controlling Sodium/Reading Food Labels: -Group verbal and written material supporting the discussion of sodium use in heart healthy nutrition. Review and explanation with models, verbal and written materials for utilization of the food label.   Exercise Physiology & Risk Factors: - Group verbal and written instruction with models to review the exercise physiology of the cardiovascular system and associated critical values. Details cardiovascular disease risk factors and the goals associated with each risk factor.   Aerobic Exercise & Resistance Training: - Gives group verbal and written discussion on the health impact of inactivity. On the components of aerobic and resistive training programs and the benefits of this training and how to safely progress through  these programs.   Flexibility, Balance, General Exercise Guidelines: - Provides group verbal and written instruction on the benefits of flexibility and balance training programs. Provides general exercise guidelines with specific guidelines to those with heart or lung disease. Demonstration and skill practice provided.   Stress Management: - Provides group verbal and written instruction about the health risks of elevated stress, cause of high stress, and healthy ways to reduce stress.   Depression: - Provides group verbal and written instruction on the correlation between heart/lung disease and depressed mood, treatment options, and the stigmas associated with seeking treatment.   Anatomy & Physiology of the Heart: - Group verbal and written instruction and models provide basic cardiac anatomy and physiology, with the coronary electrical and arterial systems. Review of: AMI, Angina, Valve disease, Heart Failure, Cardiac Arrhythmia, Pacemakers, and the ICD.   Cardiac Procedures: - Group verbal and written instruction and models to describe the testing methods done to diagnose heart disease. Reviews the outcomes of the test results. Describes the treatment choices: Medical Management, Angioplasty, or Coronary Bypass Surgery.   Cardiac Medications: - Group verbal and written instruction to review commonly prescribed medications for heart disease. Reviews the medication, class of the drug, and side effects. Includes the steps to properly store meds and maintain the prescription regimen.   Go Sex-Intimacy & Heart Disease, Get SMART - Goal Setting: - Group verbal and written instruction through game format to discuss heart disease and the return to sexual intimacy. Provides group verbal and written material to discuss and apply goal setting through the application of the S.M.A.R.T. Method.   Other Matters of the Heart: - Provides group verbal, written materials and models to describe Heart  Failure, Angina, Valve Disease, and Diabetes in the realm of heart disease. Includes description of the disease process and treatment options available to the cardiac patient.   Exercise & Equipment Safety: - Individual verbal instruction and demonstration of equipment use and safety with use of the equipment. Flowsheet Row Cardiac Rehab from 03/09/2016 in [facility] Cardiac and Pulmonary Rehab  Date  03/09/16  Educator  D. Joya Gaskins, RN  Instruction Review Code  1- partially meets, needs review/practice      Infection Prevention: - Provides verbal and written material to individual with discussion of infection control including proper hand washing and proper equipment cleaning during exercise session. Flowsheet Row Cardiac Rehab from 03/09/2016 in [facility] Cardiac and Pulmonary Rehab  Date  03/09/16  Educator  D. Joya Gaskins, RN  Instruction Review Code  2- meets goals/outcomes      Falls Prevention: - Provides verbal and written material to individual with discussion of falls prevention and safety. Flowsheet Row Cardiac Rehab from 03/09/2016 in [facility] Cardiac and Pulmonary Rehab  Date  03/09/16  Educator  D. Joya Gaskins, RN  Instruction Review Code  2- meets goals/outcomes      Diabetes: - Individual verbal and written instruction to review signs/symptoms of diabetes, desired ranges of glucose level fasting, after meals and with exercise. Advice that pre and post exercise glucose checks will be done for 3 sessions at entry of program.    Knowledge Questionnaire Score:     Knowledge Questionnaire Score - 03/09/16 1521      Knowledge Questionnaire Score   Pre Score 16/28      Core Components/Risk Factors/Patient Goals at Admission:     Personal Goals and Risk Factors at Admission - 03/09/16 1527      Core Components/Risk Factors/Patient Goals on Admission    Weight Management Weight Gain  Kirstie does not want to lose any weight.  She would like to gain a few pounds.     Sedentary Yes    Intervention Provide advice, education, support and counseling about physical activity/exercise needs.;Develop an individualized exercise prescription for aerobic and resistive training based on initial evaluation findings, risk stratification, comorbidities and participant's personal goals.   Expected Outcomes Achievement of increased cardiorespiratory fitness and enhanced flexibility, muscular endurance and strength shown through measurements of functional capacity and personal statement of participant.   Increase Strength and Stamina Yes   Intervention Provide advice, education, support and counseling about physical activity/exercise needs.;Develop an individualized exercise prescription for aerobic and resistive training based on initial evaluation findings, risk stratification, comorbidities and participant's personal goals.   Expected Outcomes Achievement of increased cardiorespiratory fitness and enhanced flexibility, muscular endurance and strength shown through measurements of functional capacity and personal statement of participant.   Improve shortness of breath with ADL's Yes   Intervention Provide education, individualized exercise plan and daily activity instruction to help decrease symptoms of SOB with activities of daily living.   Expected Outcomes Short Term: Achieves a reduction of symptoms when performing activities of daily living.   Hypertension Yes   Intervention Provide education on lifestyle modifcations including regular physical activity/exercise, weight management, moderate sodium restriction and increased consumption of fresh fruit, vegetables, and low fat dairy, alcohol moderation, and smoking cessation.;Monitor prescription use compliance.   Expected Outcomes Short Term: Continued assessment and intervention until BP is < 140/83m HG in hypertensive participants. < 130/833mHG in hypertensive participants with diabetes, heart failure or chronic kidney disease.;Long Term:  Maintenance of blood pressure at goal levels.   Lipids Yes   Intervention Provide education and support for participant on nutrition & aerobic/resistive exercise along with prescribed medications to achieve LDL '70mg'$ , HDL >'40mg'$ .   Expected Outcomes Short Term: Participant states understanding of desired cholesterol values and is compliant with medications prescribed. Participant is following exercise prescription and nutrition guidelines.;Long Term: Cholesterol controlled with medications as prescribed, with individualized exercise RX and with personalized nutrition plan. Value goals: LDL < '70mg'$ , HDL > 40 mg.      Core Components/Risk Factors/Patient Goals Review:    Core Components/Risk Factors/Patient Goals at Discharge (Final Review):    ITP Comments:     ITP Comments    Row Name 03/09/16 1533 03/11/16 0828 03/19/16 1310 04/02/16 1108 04/08/16 0701   ITP Comments Wa[place]as a history of lung cancer.  She is SOB at times and stated right before her last heart attack she was very SOB.  WaEmaleyould like to increase her strength and stamina; decrease her SOB; maintain her current weight or gain a few pounds; control her blood pressure and lipids through exercise and the cardiac rehab  program.  She denies any chest pain since her STEMI on 02/24/2016.   30 day review. Continue with ITP unless changes noted by Medical Director at signature of review. Ms Swindler completed her first day of exercise today.  Equipment safety, infection control and exercise guidelines were reviewed. Ms Pearline Cables called and said she is at home now but is still weak and cant attend Heart Track 9/6 or 7. 30 day review. Continue with ITP unless changes noted by Medical Director at signature of review.   [place] Name 04/08/16 1016 04/14/16 1456         ITP Comments Alyla Deleo just called and said she is sorry but she can't attend Cardiac Rehab today since "her doctor wants to see her on Sept 18th. " I asked her what he wanted to see  her for and Jenay said he just wants to see me.  Note from doctor to discharge pt from program.         Comments: Discharge ITP

## 2016-04-14 NOTE — Progress Notes (Signed)
Discharge Summary  Patient Details  Name: [name]: [id number] Date of Birth: [birth year] Referring Provider:   Flowsheet Row Cardiac Rehab from 03/09/2016 in [facility]  Referring Provider  Sherren Mocha MD       Number of Visits: 3  Reason for Discharge:  Early Exit:  Physician Advised to discharge  Smoking History:  History  Smoking Status  . Former Smoker  . Types: Cigarettes  Smokeless Tobacco  . Never Used    Diagnosis:  ST elevation myocardial infarction involving left anterior descending (LAD) coronary artery (HCC)  ADL UCSD:   Initial Exercise Prescription:     Initial Exercise Prescription - 03/09/16 1300      Date of Initial Exercise RX and Referring Provider   Date 03/09/16   Referring Provider Sherren Mocha MD     Treadmill   MPH 1.6   Grade 0.5   Minutes 15   METs 2.34     NuStep   Level 1  60-80 spm   Minutes 15   METs 2     Arm/Foot Ergometer   Level 1  25-40 rpm   Minutes 15   METs 2     Prescription Details   Frequency (times per week) 3   Duration Progress to 45 minutes of aerobic exercise without signs/symptoms of physical distress     Intensity   THRR 40-80% of Max Heartrate 93-128   Ratings of Perceived Exertion 11-15   Perceived Dyspnea 0-4     Progression   Progression Continue to progress workloads to maintain intensity without signs/symptoms of physical distress.     Resistance Training   Training Prescription Yes   Weight 2 lbs   Reps 10-15      Discharge Exercise Prescription (Final Exercise Prescription Changes):     Exercise Prescription Changes - 03/19/16 1300      Exercise Review   Progression Yes     Response to Exercise   Blood Pressure (Admit) 132/64   Blood Pressure (Exercise) 134/70   Blood Pressure (Exit) 96/60   Heart Rate (Admit) 83 bpm   Heart Rate (Exercise) 97 bpm   Heart Rate (Exit) 64 bpm     Progression   Progression Continue to progress  workloads to maintain intensity without signs/symptoms of physical distress.   Average METs 2.44     Resistance Training   Training Prescription Yes   Weight 2   Reps 10-15     Interval Training   Interval Training No     Treadmill   MPH 1.4   Grade 0   Minutes 15     NuStep   Level 1   Minutes 15   METs 2     Arm/Foot Ergometer   Level 4   Minutes 15      Functional Capacity:     6 Minute Walk    Row Name 03/09/16 1350         6 Minute Walk   Phase Initial     Distance 965 feet     Walk Time 6 minutes     # of Rest Breaks 0     MPH 1.83     METS 2.59     RPE 17     Perceived Dyspnea  3     VO2 Peak 9.06     Symptoms Yes (comment)     Comments Sciatt[place] nerve pain, some SOB     Resting HR 58 bpm  Resting BP 130/74     Max Ex. HR 101 bpm     Max Ex. BP 134/74     2 Minute Post BP 128/64       Interval Oxygen   Interval Oxygen? Yes     6 Minute Oxygen Saturation % 95 %     6 Minute Liters of Oxygen 0 L  Room Air        Psychological, QOL, Others - Outcomes: PHQ 2/9: Depression screen [facility] 2/9 03/31/2016 03/09/2016 03/09/2016 01/08/2016 04/08/2015  Decreased Interest 0 0 0 0 0  Down, Depressed, Hopeless 0 0 0 0 0  PHQ - 2 Score 0 0 0 0 0  Altered sleeping - 0 - - -  Tired, decreased energy - 0 - - -  Change in appetite - 0 - - -  Feeling bad or failure about yourself  - 0 - - -  Trouble concentrating - 0 - - -  Moving slowly or fidgety/restless - 0 - - -  Suicidal thoughts - 0 - - -  PHQ-9 Score - 0 - - -  Difficult doing work/chores - Not difficult at all - - -  Some recent data might be hidden    Quality of Life:     Quality of Life - 03/09/16 1516      Quality of Life Scores   Health/Function Pre 16.97 %   Socioeconomic Pre 20.71 %   Psych/Spiritual Pre 19.93 %   Family Pre 19 %   GLOBAL Pre 18.65 %      Personal Goals: Goals established at orientation with interventions provided to work toward goal.     Personal Goals and  Risk Factors at Admission - 03/09/16 1527      Core Components/Risk Factors/Patient Goals on Admission    Weight Management Weight Gain  Zanaria does not want to lose any weight.  She would like to gain a few pounds.     Sedentary Yes   Intervention Provide advice, education, support and counseling about physical activity/exercise needs.;Develop an individualized exercise prescription for aerobic and resistive training based on initial evaluation findings, risk stratification, comorbidities and participant's personal goals.   Expected Outcomes Achievement of increased cardiorespiratory fitness and enhanced flexibility, muscular endurance and strength shown through measurements of functional capacity and personal statement of participant.   Increase Strength and Stamina Yes   Intervention Provide advice, education, support and counseling about physical activity/exercise needs.;Develop an individualized exercise prescription for aerobic and resistive training based on initial evaluation findings, risk stratification, comorbidities and participant's personal goals.   Expected Outcomes Achievement of increased cardiorespiratory fitness and enhanced flexibility, muscular endurance and strength shown through measurements of functional capacity and personal statement of participant.   Improve shortness of breath with ADL's Yes   Intervention Provide education, individualized exercise plan and daily activity instruction to help decrease symptoms of SOB with activities of daily living.   Expected Outcomes Short Term: Achieves a reduction of symptoms when performing activities of daily living.   Hypertension Yes   Intervention Provide education on lifestyle modifcations including regular physical activity/exercise, weight management, moderate sodium restriction and increased consumption of fresh fruit, vegetables, and low fat dairy, alcohol moderation, and smoking cessation.;Monitor prescription use compliance.    Expected Outcomes Short Term: Continued assessment and intervention until BP is < 140/63m HG in hypertensive participants. < 130/881mHG in hypertensive participants with diabetes, heart failure or chronic kidney disease.;Long Term: Maintenance of blood pressure at goal levels.  Lipids Yes   Intervention Provide education and support for participant on nutrition & aerobic/resistive exercise along with prescribed medications to achieve LDL '70mg'$ , HDL >'40mg'$ .   Expected Outcomes Short Term: Participant states understanding of desired cholesterol values and is compliant with medications prescribed. Participant is following exercise prescription and nutrition guidelines.;Long Term: Cholesterol controlled with medications as prescribed, with individualized exercise RX and with personalized nutrition plan. Value goals: LDL < '70mg'$ , HDL > 40 mg.       Personal Goals Discharge:   Nutrition & Weight - Outcomes:     Pre Biometrics - 03/09/16 1356      Pre Biometrics   Height 5' 6.5" (1.689 m)   Weight 115 lb 6.4 oz (52.3 kg)   Waist Circumference 31 inches   Hip Circumference 37 inches   Waist to Hip Ratio 0.84 %   BMI (Calculated) 18.4   Single Leg Stand 4.12 seconds       Nutrition:     Nutrition Therapy & Goals - 03/19/16 0917      Nutrition Therapy   Diet low sodium, easy-to-chew diet for COPD, adeqate calories for weight gain   Protein (specify units) 7oz   Saturated Fats 12 max. grams   Fruits and Vegetables 5 servings/day   Sodium 1500 grams     Personal Nutrition Goals   Personal Goal #1 Eat something every 3 hours throughout the day   Personal Goal #2 Drink 1/2 - 1 bottle Ensure or Boost or other nutrition drink each day   Personal Goal #3 Eat soft foods like pudding, yogurt, mashed potatoes, lean ground or chopped meats   Comments Ms. Simmerman reports poor appetite, and fatigue from chewing, some foods feel stuck in her throat after swallowing. Provided dietary tips for  better breathing, and set goals to improve overall nutrition intake.      Intervention Plan   Intervention Nutrition handout(s) given to patient.   Expected Outcomes Short Term Goal: Understand basic principles of dietary content, such as calories, fat, sodium, cholesterol and nutrients.;Short Term Goal: A plan has been developed with personal nutrition goals set during dietitian appointment.;Long Term Goal: Adherence to prescribed nutrition plan.      Nutrition Discharge:     Nutrition Assessments - 03/09/16 1549      Rate Your Plate Scores   Pre Score 69   Pre Score % 76.6 %      Education Questionnaire Score:     Knowledge Questionnaire Score - 03/09/16 1521      Knowledge Questionnaire Score   Pre Score 16/28      Goals reviewed with patient; copy given to patient.

## 2016-04-16 ENCOUNTER — Ambulatory Visit: Payer: PPO

## 2016-04-16 ENCOUNTER — Encounter: Payer: Self-pay | Admitting: Cardiovascular Disease

## 2016-04-16 ENCOUNTER — Ambulatory Visit: Payer: PPO | Admitting: Family Medicine

## 2016-04-21 ENCOUNTER — Encounter: Payer: Self-pay | Admitting: Adult Health

## 2016-04-21 ENCOUNTER — Ambulatory Visit (INDEPENDENT_AMBULATORY_CARE_PROVIDER_SITE_OTHER): Payer: PPO | Admitting: Adult Health

## 2016-04-21 ENCOUNTER — Ambulatory Visit (INDEPENDENT_AMBULATORY_CARE_PROVIDER_SITE_OTHER)
Admission: RE | Admit: 2016-04-21 | Discharge: 2016-04-21 | Disposition: A | Discharge status: Home / Self Care | Payer: PPO | Source: Ambulatory Visit | Attending: Adult Health | Admitting: Adult Health

## 2016-04-21 VITALS — BP 122/70 | HR 64 | Ht 66.0 in | Wt 118.4 lb

## 2016-04-21 DIAGNOSIS — R05 Cough: Secondary | ICD-10-CM | POA: Diagnosis not present

## 2016-04-21 DIAGNOSIS — J431 Panlobular emphysema: Secondary | ICD-10-CM

## 2016-04-21 DIAGNOSIS — J189 Pneumonia, unspecified organism: Secondary | ICD-10-CM | POA: Diagnosis not present

## 2016-04-21 DIAGNOSIS — J441 Chronic obstructive pulmonary disease with (acute) exacerbation: Secondary | ICD-10-CM | POA: Diagnosis not present

## 2016-04-21 NOTE — Progress Notes (Signed)
Subjective:    Patient ID: [name], female    DOB: 1940-08-25, 75 y.o.   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  PET done on 05/31/15 showed > No hypermetabolic activity  in LUL RLL nodule low grade activity, containing subpleural fat. ?post inflammatory . LLL nodule low grade activity CT chest 10/2015 , no new nodules minimal increase in LLL nodule (1.1 x 0.6 cm, however as this nodule did NOT demonstrateincreased metabolic activity on PET scan performed 05/31/2015 and islocated in an area of chronic atelectasis (present since at leastthe 03/2013 examination CT chest 03/11/16 >neg PE CT  Chest 03/19/16 >worsening opacities in RLL , c/w infection .   04/21/2016 Follow up : GOLD B COPD , hx of Lung cancer (s/p RLL segmentectomy)  , lung nodules  Pt returns for 1 month follow up . She was seen last month after being in hospital for COPD flare , tx w/  Levaquin and Prednisone She is feeling better . Gets winded with walking. Has come daily cough but decreased, near her baseline.   Denies chest pain , orthopnea, edema or fever.  Does complain of nasal drainage, stuffy nose .  Using budesonide neb Twice daily  . Only uses Duoneb As needed  , maybe couple times a day.      Past Medical History:  Diagnosis Date  . Arthritis   . CAD (coronary artery disease) 03/2009   s/p MI  . Chronic combined systolic and diastolic heart failure ([place]) 06/17/2009   Qualifier: Diagnosis of  By: Burt Knack, MD, Clayburn Pert   . CKD (chronic kidney disease) stage 3, GFR 30-59 ml/min   . Emphysema/COPD ([place])   . Ex-smoker quit 2008  . GERD (gastroesophageal reflux disease)   . Helicobacter pylori gastritis 10/2007   treated  . History of CVA (cerebrovascular accident) without residual deficits  01/2012, 06/2012   R hemorrhagic MCA 01/2012 with remote lacunar infarct L putamen and IC, rpt 06/2012 acute multifocal R MCA infarct with remote hemorrhagic strokes affecting L basal ganglia and periventricular white matter, full recovery  . HLD (hyperlipidemia)   . HTN (hypertension)   . Ischemic cardiomyopathy 02/27/2014  . Lung cancer ([place]) dx'd 07/2006   Lung CA, s/p resection, followed by Dr Earlie Server  . Macular degeneration   . Myocardial infarction [facility]) 03/2009   Acute myocardial infarction 2010 - treated with BMS of LCx. LVEF 50% with subsequent CHF  . Osteoporosis 09/2013   T -3.6 forearm 09/2013, T -4.5 forearm 04/2015  . Stroke [facility])       Review of Systems Constitutional:   No  weight loss, night sweats,  Fevers, chills,  +fatigue, or  lassitude.  HEENT:   No headaches,  Difficulty swallowing,  Tooth/dental problems, or  Sore throat,                No sneezing, itching, ear ache, nasal congestion, post nasal drip,   CV:  No chest pain,  Orthopnea, PND, swelling in lower extremities, anasarca, dizziness, palpitations, syncope.   GI  No heartburn, indigestion, abdominal pain, nausea, vomiting, diarrhea, change in bowel habits, loss of appetite, bloody stools.   Resp:     No chest wall deformity  Skin: no rash or lesions.  GU: no  dysuria, change in color of urine, no urgency or frequency.  No flank pain, no hematuria   MS:  No joint pain or swelling.  No decreased range of motion.  No back pain.  Psych:  No change in mood or affect. No depression or anxiety.  No memory loss.         Objective:   Physical Exam   Vitals:   04/21/16 1147  BP: 122/70  Pulse: 64  SpO2: 97%  Weight: 118 lb 6.4 oz (53.7 kg)  Height: '5\' 6"'$  (1.676 m)    GEN: A/Ox3; pleasant , NAD, elderly    HEENT:  [place]/AT,  EACs-clear, TMs-wnl, NOSE-clear, THROAT-clear, no lesions, no postnasal drip or exudate noted.   NECK:  Supple w/ fair ROM; no JVD; normal carotid impulses w/o bruits; no  thyromegaly or nodules palpated; no lymphadenopathy.    RESP  Decreased BS in bases    . no accessory muscle use, no dullness to percussion  CARD:  RRR, no m/r/g  , no peripheral edema, pulses intact, no cyanosis or clubbing.  GI:   Soft & nt; nml bowel sounds; no organomegaly or masses detected.   Musco: Warm bil, no deformities or joint swelling noted.   Neuro: alert, no focal deficits noted.    Skin: Warm, no lesions or rashes  Monisha Siebel NP-C  [facility]

## 2016-04-21 NOTE — Progress Notes (Signed)
Called spoke with pt. Reviewed results and recs. Pt voiced understanding and had no further questions.

## 2016-04-21 NOTE — Patient Instructions (Addendum)
May try Claritin '10mg'$  At bedtime  As needed  Drainage.  Try Saline nasal rinses As needed   Try Nasacort 2 puffs daily As needed  Nasal stuffiness.  Continue on Budesonide Neb Twice daily  .  May use Duoneb every 4hrs as needed wheezing and shortness of breath .  follow up Dr. Lamonte Sakai  In 3 months and As needed   Please contact office for sooner follow up if symptoms do not improve or worsen or seek emergency care  Chest xray today .

## 2016-04-22 ENCOUNTER — Telehealth: Payer: Self-pay | Admitting: Family Medicine

## 2016-04-22 ENCOUNTER — Ambulatory Visit: Payer: PPO

## 2016-04-22 DIAGNOSIS — E440 Moderate protein-calorie malnutrition: Secondary | ICD-10-CM | POA: Diagnosis not present

## 2016-04-22 DIAGNOSIS — J440 Chronic obstructive pulmonary disease with acute lower respiratory infection: Secondary | ICD-10-CM | POA: Diagnosis not present

## 2016-04-22 DIAGNOSIS — I5042 Chronic combined systolic (congestive) and diastolic (congestive) heart failure: Secondary | ICD-10-CM | POA: Diagnosis not present

## 2016-04-22 DIAGNOSIS — Z7982 Long term (current) use of aspirin: Secondary | ICD-10-CM | POA: Diagnosis not present

## 2016-04-22 DIAGNOSIS — C3431 Malignant neoplasm of lower lobe, right bronchus or lung: Secondary | ICD-10-CM | POA: Diagnosis not present

## 2016-04-22 DIAGNOSIS — I255 Ischemic cardiomyopathy: Secondary | ICD-10-CM | POA: Diagnosis not present

## 2016-04-22 DIAGNOSIS — Z7901 Long term (current) use of anticoagulants: Secondary | ICD-10-CM | POA: Diagnosis not present

## 2016-04-22 DIAGNOSIS — E785 Hyperlipidemia, unspecified: Secondary | ICD-10-CM | POA: Diagnosis not present

## 2016-04-22 DIAGNOSIS — I110 Hypertensive heart disease with heart failure: Secondary | ICD-10-CM | POA: Diagnosis not present

## 2016-04-22 DIAGNOSIS — Z87891 Personal history of nicotine dependence: Secondary | ICD-10-CM | POA: Diagnosis not present

## 2016-04-22 DIAGNOSIS — J189 Pneumonia, unspecified organism: Secondary | ICD-10-CM | POA: Diagnosis not present

## 2016-04-22 DIAGNOSIS — I2510 Atherosclerotic heart disease of native coronary artery without angina pectoris: Secondary | ICD-10-CM | POA: Diagnosis not present

## 2016-04-22 DIAGNOSIS — Z7951 Long term (current) use of inhaled steroids: Secondary | ICD-10-CM | POA: Diagnosis not present

## 2016-04-22 DIAGNOSIS — J441 Chronic obstructive pulmonary disease with (acute) exacerbation: Secondary | ICD-10-CM | POA: Diagnosis not present

## 2016-04-22 NOTE — Assessment & Plan Note (Signed)
Recent COPD flare +/- PNA  Check cxr today , no further abx at this time  Clinically she is improving .

## 2016-04-22 NOTE — Telephone Encounter (Signed)
Message left advising patient.  

## 2016-04-22 NOTE — Assessment & Plan Note (Signed)
Recent flare now resolving   Plan  Patient Instructions  May try Claritin '10mg'$  At bedtime  As needed  Drainage.  Try Saline nasal rinses As needed   Try Nasacort 2 puffs daily As needed  Nasal stuffiness.  Continue on Budesonide Neb Twice daily  .  May use Duoneb every 4hrs as needed wheezing and shortness of breath .  follow up Dr. Lamonte Sakai  In 3 months and As needed   Please contact office for sooner follow up if symptoms do not improve or worsen or seek emergency care  Chest xray today .

## 2016-04-22 NOTE — Telephone Encounter (Signed)
Will discuss prolia injection and likely recheck labs at f/u 10/2. In interim, recommend increase protein as able, continue ensure regularly as prior.

## 2016-04-22 NOTE — Telephone Encounter (Signed)
-----   Message from Marchia Bond sent at 04/22/2016  8:13 AM EDT ----- Regarding: RE: ? about whether it's ok to give prolia inj tomorrow ( Dr. Synthia Innocent pt) Pt notified, and nurse visit canceled. She has a hosp f/u on 10/3 with Dr. Darnell Level, but I told her we would be in touch prior to that re: Prolia Inj.  Thanks Aniceto Boss ----- Message ----- From: Tonia Ghent, MD Sent: 04/21/2016   4:55 PM To: Marchia Bond, Ria Bush, MD Subject: RE: ? about whether it's ok to give prolia i#  Please call pt.  I would hold off on this for now, until PCP can see this.  She also had a noted drop in albumin.  I want to talk to G first.   Thanks.   Brigitte Pulse  ----- Message ----- From: Marchia Bond Sent: 04/21/2016   3:18 PM To: Tonia Ghent, MD Subject: ? about whether it's ok to give prolia inj t#  This pt is scheduled for a Prolia inj tomorrow. The pt is suppose to have normal calcium level within 30 days prior to inj. She had several nml readings within a 1 month period, but the most recent ( 2 weeks ago) was 8.8 which was down from 9.2 the day prior. We're not suppose to give it if the pt's calcium is abn. I know it's only .20 too low, but the fact that it decreased .40 in a day makes me really question it. Please advise  Thanks Aniceto Boss

## 2016-04-24 ENCOUNTER — Other Ambulatory Visit: Payer: Self-pay

## 2016-04-24 NOTE — Patient Outreach (Signed)
Final transition of care call/ case closure: Placed call to patient who reports that she is doing well. Reports that her breathing is doing "good" .  Reports that she is eating well and drinking between 1-2 ensure per day.  Denies any new problems or concerns.    Reviewed with patient that she has accomplished all of her transition of care goals. Including avoiding a readmission. Offered for patient to work with health coach however she states that she feels like she knows how to manage her care and when to call the doctor.  Reviewed case closure with patient and she is in agreement.   PLAN: Will close case. Goals met. Will send MD and patient case closure letters. Will notify case management assistant of case closure.  [facility] CM Care Plan Problem One   Flowsheet Row Most Recent Value  Care Plan Problem One  Recent admission for pneumonia  Role Documenting the Problem One  Care Management [place] for Problem One  Active  [facility] Long Term Goal (31-90 days)  Patient will verbalize no readmissions to the hospital for COPD in the next 31 days.  [facility] Long Term Goal Start Date  03/24/16  [facility] Long Term Goal Met Date  04/17/16  Interventions for Problem One Long Term Goal  Encouraged patient to continue to take medications as prescribed and call MD if needed.  [facility] CM Short Term Goal #1 (0-30 days)  Patient will verbalize following up with primary Md on 04/01/2016  [facility] CM Short Term Goal #1 Start Date  03/24/16  [facility] CM Short Term Goal #1 Met Date  04/08/16  Interventions for Short Term Goal #1  Reviewed with patient questions to ask during office visiti.  [facility] CM Short Term Goal #2 (0-30 days)  Patient will verbalize having prescribed respiratory medications in the next 7 days.  [facility] CM Short Term Goal #2 Start Date  03/24/16  [facility] CM Short Term Goal #2 Met Date  03/31/16  Interventions for Short Term Goal #2  Phone calls to MD office, pharmacy, encouraged patient to speak with pharmacist  for  questions about medications and nebulizer if needed.     Tomasa Rand, RN, BSN, CEN [facility] [employer] [phone]

## 2016-04-28 ENCOUNTER — Ambulatory Visit (INDEPENDENT_AMBULATORY_CARE_PROVIDER_SITE_OTHER): Payer: PPO | Admitting: Family Medicine

## 2016-04-28 ENCOUNTER — Encounter: Payer: Self-pay | Admitting: Family Medicine

## 2016-04-28 VITALS — BP 126/74 | HR 60 | Temp 97.6°F | Wt 118.0 lb

## 2016-04-28 DIAGNOSIS — M810 Age-related osteoporosis without current pathological fracture: Secondary | ICD-10-CM

## 2016-04-28 DIAGNOSIS — I5042 Chronic combined systolic (congestive) and diastolic (congestive) heart failure: Secondary | ICD-10-CM

## 2016-04-28 DIAGNOSIS — J431 Panlobular emphysema: Secondary | ICD-10-CM

## 2016-04-28 DIAGNOSIS — E440 Moderate protein-calorie malnutrition: Secondary | ICD-10-CM | POA: Diagnosis not present

## 2016-04-28 DIAGNOSIS — I10 Essential (primary) hypertension: Secondary | ICD-10-CM | POA: Diagnosis not present

## 2016-04-28 DIAGNOSIS — R636 Underweight: Secondary | ICD-10-CM

## 2016-04-28 DIAGNOSIS — I255 Ischemic cardiomyopathy: Secondary | ICD-10-CM

## 2016-04-28 LAB — COMPREHENSIVE METABOLIC PANEL
ALBUMIN: 3.9 g/dL (ref 3.5–5.2)
ALT: 14 U/L (ref 0–35)
AST: 18 U/L (ref 0–37)
Alkaline Phosphatase: 78 U/L (ref 39–117)
BUN: 15 mg/dL (ref 6–23)
CALCIUM: 10 mg/dL (ref 8.4–10.5)
CHLORIDE: 101 meq/L (ref 96–112)
CO2: 33 meq/L — AB (ref 19–32)
Creatinine, Ser: 0.87 mg/dL (ref 0.40–1.20)
GFR: 67.39 mL/min (ref >60.00)
Glucose, Bld: 90 mg/dL (ref 70–99)
POTASSIUM: 4.4 meq/L (ref 3.5–5.1)
Sodium: 140 mEq/L (ref 135–145)
Total Bilirubin: 0.5 mg/dL (ref 0.2–1.2)
Total Protein: 7.3 g/dL (ref 6.0–8.3)

## 2016-04-28 LAB — TSH: TSH: 1.54 u[IU]/mL (ref 0.35–4.50)

## 2016-04-28 LAB — CBC WITH DIFFERENTIAL/PLATELET
BASOS PCT: 0.7 % (ref 0.0–3.0)
Basophils Absolute: 0 10*3/uL (ref 0.0–0.1)
EOS PCT: 5.7 % — AB (ref 0.0–5.0)
Eosinophils Absolute: 0.4 10*3/uL (ref 0.0–0.7)
HEMATOCRIT: 42 % (ref 36.0–46.0)
HEMOGLOBIN: 14.3 g/dL (ref 12.0–15.0)
LYMPHS PCT: 27 % (ref 12.0–46.0)
Lymphs Abs: 1.7 10*3/uL (ref 0.7–4.0)
MCHC: 33.9 g/dL (ref 30.0–36.0)
MCV: 93.6 fl (ref 78.0–100.0)
MONOS PCT: 8.8 % (ref 3.0–12.0)
Monocytes Absolute: 0.6 10*3/uL (ref 0.1–1.0)
NEUTROS ABS: 3.7 10*3/uL (ref 1.4–7.7)
Neutrophils Relative %: 57.8 % (ref 43.0–77.0)
Platelets: 304 10*3/uL (ref 150.0–400.0)
RBC: 4.49 Mil/uL (ref 3.87–5.11)
RDW: 15.1 % (ref 11.5–15.5)
WBC: 6.3 10*3/uL (ref 4.0–10.5)

## 2016-04-28 NOTE — Assessment & Plan Note (Signed)
Unable to tolerate ACEI or beta blocker.

## 2016-04-28 NOTE — Progress Notes (Signed)
Pre visit review using our clinic review tool, if applicable. No additional management support is needed unless otherwise documented below in the visit note. 

## 2016-04-28 NOTE — Assessment & Plan Note (Signed)
Unable to tolerate ACEI or B blocker. [facility]

## 2016-04-28 NOTE — Progress Notes (Signed)
BP 126/74   Pulse 60   Temp 97.6 F (36.4 C) (Oral)   Wt 118 lb (53.5 kg)   BMI 19.05 kg/m    CC: 1 mo f/u visit Subjective:    Patient ID: [name], female    DOB: 10-19-1940, 75 y.o.   MRN: [id number]  HPI: [name] is a 75 y.o. female presenting on 04/28/2016 for Follow-up   Osteoporosis - recent prolia was held due to borderline low calcium score. Albumin was also low. She is regular with 2 ensure per day (1 daily in last few days) and 3 meals a day. Will repeat today.   Recent COPD exacerbation with PNA with hospitalization. Completed antibiotic course well. No longer needing albuterol rescue inhaler. Regular with pulmicort neb BID.   Chronic sCHF - stable on lasix, isosorbide, spironolactone. Has not tolerated ACEI or beta blocker. Seen by Dr Burt Knack. Recent hospitalization with chest pain, ruled out for acute MI. She has stopped cardiac rehab. [facility] [place].   Relevant past medical, surgical, family and social history reviewed and updated as indicated. Interim medical history since our last visit reviewed. Allergies and medications reviewed and updated. Current Outpatient Prescriptions on File Prior to Visit  Medication Sig  . albuterol (PROAIR HFA) 108 (90 Base) MCG/ACT inhaler Inhale 2 puffs into the lungs every 4 (four) hours as needed for wheezing or shortness of breath.  Marland Kitchen aspirin EC 81 MG tablet Take 1 tablet (81 mg total) by mouth daily.  . budesonide (PULMICORT) 0.5 MG/2ML nebulizer solution Take 2 mLs (0.5 mg total) by nebulization 2 (two) times daily.  . Calcium Carbonate-Vitamin D (CALCIUM-VITAMIN D) 500-200 MG-UNIT per tablet Take 1 tablet by mouth 2 (two) times daily.   . Cholecalciferol (VITAMIN D) 2000 UNITS CAPS Take 1 capsule by mouth daily.  . clopidogrel (PLAVIX) 75 MG tablet Take 1 tablet (75 mg total) by mouth daily.  Marland Kitchen denosumab (PROLIA) 60 MG/ML SOLN injection Inject 60 mg into the skin every 6 (six)  months. Administer in upper arm, thigh, or abdomen  . furosemide (LASIX) 40 MG tablet Take 0.5 tablets (20 mg total) by mouth daily.  Marland Kitchen ipratropium-albuterol (DUONEB) 0.5-2.5 (3) MG/3ML SOLN Take 3 mLs by nebulization 2 (two) times daily.  . isosorbide mononitrate (IMDUR) 30 MG 24 hr tablet TAKE 1 TABLET BY MOUTH DAILY  . LOTEMAX 0.5 % ophthalmic suspension Place 1 drop into both eyes daily.   . nitroGLYCERIN (NITROSTAT) 0.4 MG SL tablet Place 1 tablet (0.4 mg total) under the tongue every 5 (five) minutes as needed for chest pain.  . pantoprazole (PROTONIX) 40 MG tablet Take 1 tablet (40 mg total) by mouth daily.  . simvastatin (ZOCOR) 40 MG tablet TAKE ONE TABLET BY MOUTH AT BEDTIME  . spironolactone (ALDACTONE) 25 MG tablet TAKE 1/2 TABLET BY MOUTH ONCE DAILY   No current facility-administered medications on file prior to visit.     Review of Systems Per HPI unless specifically indicated in ROS section     Objective:    BP 126/74   Pulse 60   Temp 97.6 F (36.4 C) (Oral)   Wt 118 lb (53.5 kg)   BMI 19.05 kg/m   Wt Readings from Last 3 Encounters:  04/28/16 118 lb (53.5 kg)  04/24/16 115 lb (52.2 kg)  04/21/16 118 lb 6.4 oz (53.7 kg)   Physical Exam  Constitutional: She appears well-developed and well-nourished. No distress.  HENT:  Mouth/Throat: Oropharynx is clear and moist. No oropharyngeal exudate.  Cardiovascular: Normal rate, regular rhythm, normal heart sounds and intact distal pulses.   No murmur heard. Pulmonary/Chest: Effort normal and breath sounds normal. No respiratory distress. She has no wheezes. She has no rales.  coarse  Musculoskeletal: She exhibits no edema.  Skin: Skin is warm and dry. No rash noted.  Psychiatric: She has a normal mood and affect.  Nursing note and vitals reviewed.     Assessment & Plan:   Problem List Items Addressed This Visit    Chronic combined systolic and diastolic heart failure ([place])    Unable to tolerate ACEI or beta  blocker.      COPD (chronic obstructive pulmonary disease) (HCC)    Stable period on only pulmicort neb. Rare albuterol inh need.      Essential hypertension    Chronic, stable. Continue current regimen.      Ischemic cardiomyopathy    Unable to tolerate ACEI or B blocker. Planning [facility].      Malnutrition of moderate degree - Primary    Discussed meals and ensure use - currently at 1 ensure a day along with 3 regular meals. Check labs.  Anticipate recent hypocalcemia related to hypoalbuminemia and not true hypocalcemia as serum calcium corrects for albumin (corrected calcium 9.3).       Relevant Orders   TSH   Comprehensive metabolic panel   CBC with Differential/Platelet   Osteoporosis    Update Ca level to determine if we can restart prolia injection.       Relevant Orders   TSH   Underweight    Encouraged continued weight gain effort.       Other Visit Diagnoses   None.      Follow up plan: Return in about 2 months (around 06/28/2016) for medicare wellness visit.  Ria Bush, MD

## 2016-04-28 NOTE — Assessment & Plan Note (Signed)
Update Ca level to determine if we can restart prolia injection.

## 2016-04-28 NOTE — Assessment & Plan Note (Addendum)
Discussed meals and ensure use - currently at 1 ensure a day along with 3 regular meals. Check labs.  Anticipate recent hypocalcemia related to hypoalbuminemia and not true hypocalcemia as serum calcium corrects for albumin (corrected calcium 9.3).

## 2016-04-28 NOTE — Patient Instructions (Addendum)
You are doing well today.  Labs today - we will be in touch with results. Return as needed or in 2 months for medicare wellness visit

## 2016-04-28 NOTE — Assessment & Plan Note (Signed)
Stable period on only pulmicort neb. Rare albuterol inh need.

## 2016-04-28 NOTE — Assessment & Plan Note (Signed)
Chronic, stable. Continue current regimen. 

## 2016-04-28 NOTE — Assessment & Plan Note (Signed)
Encouraged continued weight gain effort.

## 2016-05-04 ENCOUNTER — Encounter: Payer: Self-pay | Admitting: *Deleted

## 2016-05-05 ENCOUNTER — Other Ambulatory Visit: Payer: PPO

## 2016-05-05 NOTE — Addendum Note (Signed)
Addended by: Royann Shivers A on: 05/05/2016 02:51 PM   Modules accepted: Orders

## 2016-05-05 NOTE — Addendum Note (Signed)
Addended by: Royann Shivers A on: 05/05/2016 03:04 PM   Modules accepted: Orders

## 2016-05-06 ENCOUNTER — Encounter: Payer: Self-pay | Admitting: *Deleted

## 2016-05-06 DIAGNOSIS — I2102 ST elevation (STEMI) myocardial infarction involving left anterior descending coronary artery: Secondary | ICD-10-CM

## 2016-05-06 NOTE — Progress Notes (Signed)
Cardiac Individual Treatment Plan  Patient Details  Name: [name] MRN: [id number] Date of Birth: [birth year] Referring Provider:   Flowsheet Row Cardiac Rehab from 03/09/2016 in [facility] Cardiac and Pulmonary Rehab  Referring Provider  Sherren Mocha MD      Initial Encounter Date:  Flowsheet Row Cardiac Rehab from 03/09/2016 in [facility] Cardiac and Pulmonary Rehab  Date  03/09/16  Referring Provider  Sherren Mocha MD      Visit Diagnosis: ST elevation myocardial infarction involving left anterior descending (LAD) coronary artery ([place])  Patient's Home Medications on Admission:  Current Outpatient Prescriptions:  .  albuterol (PROAIR HFA) 108 (90 Base) MCG/ACT inhaler, Inhale 2 puffs into the lungs every 4 (four) hours as needed for wheezing or shortness of breath., Disp: 1 Inhaler, Rfl: 1 .  aspirin EC 81 MG tablet, Take 1 tablet (81 mg total) by mouth daily., Disp: , Rfl:  .  budesonide (PULMICORT) 0.5 MG/2ML nebulizer solution, Take 2 mLs (0.5 mg total) by nebulization 2 (two) times daily., Disp: 120 mL, Rfl: 5 .  Calcium Carbonate-Vitamin D (CALCIUM-VITAMIN D) 500-200 MG-UNIT per tablet, Take 1 tablet by mouth 2 (two) times daily. , Disp: , Rfl:  .  Cholecalciferol (VITAMIN D) 2000 UNITS CAPS, Take 1 capsule by mouth daily., Disp: , Rfl:  .  clopidogrel (PLAVIX) 75 MG tablet, Take 1 tablet (75 mg total) by mouth daily., Disp: 30 tablet, Rfl: 12 .  denosumab (PROLIA) 60 MG/ML SOLN injection, Inject 60 mg into the skin every 6 (six) months. Administer in upper arm, thigh, or abdomen, Disp: 60 mL, Rfl: 1 .  furosemide (LASIX) 40 MG tablet, Take 0.5 tablets (20 mg total) by mouth daily., Disp: , Rfl:  .  ipratropium-albuterol (DUONEB) 0.5-2.5 (3) MG/3ML SOLN, Take 3 mLs by nebulization 2 (two) times daily., Disp: , Rfl:  .  isosorbide mononitrate (IMDUR) 30 MG 24 hr tablet, TAKE 1 TABLET BY MOUTH DAILY, Disp: 30 tablet, Rfl: 1 .  LOTEMAX 0.5 % ophthalmic suspension, Place 1 drop into  both eyes daily. , Disp: , Rfl:  .  nitroGLYCERIN (NITROSTAT) 0.4 MG SL tablet, Place 1 tablet (0.4 mg total) under the tongue every 5 (five) minutes as needed for chest pain., Disp: 25 tablet, Rfl: 3 .  pantoprazole (PROTONIX) 40 MG tablet, Take 1 tablet (40 mg total) by mouth daily., Disp: 30 tablet, Rfl: 11 .  simvastatin (ZOCOR) 40 MG tablet, TAKE ONE TABLET BY MOUTH AT BEDTIME, Disp: 30 tablet, Rfl: 11 .  spironolactone (ALDACTONE) 25 MG tablet, TAKE 1/2 TABLET BY MOUTH ONCE DAILY, Disp: 30 tablet, Rfl: 11  Past Medical History: Past Medical History:  Diagnosis Date  . Arthritis   . CAD (coronary artery disease) 03/2009   s/p MI  . Chronic combined systolic and diastolic heart failure ([place]) 06/17/2009   Qualifier: Diagnosis of  By: Burt Knack, MD, Clayburn Pert   . CKD (chronic kidney disease) stage 3, GFR 30-59 ml/min   . Emphysema/COPD ([place])   . Ex-smoker quit 2008  . GERD (gastroesophageal reflux disease)   . HCAP (healthcare-associated pneumonia) 03/19/2016  . Helicobacter pylori gastritis 10/2007   treated  . History of CVA (cerebrovascular accident) without residual deficits 01/2012, 06/2012   R hemorrhagic MCA 01/2012 with remote lacunar infarct L putamen and IC, rpt 06/2012 acute multifocal R MCA infarct with remote hemorrhagic strokes affecting L basal ganglia and periventricular white matter, full recovery  . HLD (hyperlipidemia)   . HTN (hypertension)   . Ischemic cardiomyopathy  02/27/2014  . Lung cancer ([place]) dx'd 07/2006   Lung CA, s/p resection, followed by Dr Earlie Server  . Macular degeneration   . Myocardial infarction 03/2009   Acute myocardial infarction 2010 - treated with BMS of LCx. LVEF 50% with subsequent CHF  . Osteoporosis 09/2013   T -3.6 forearm 09/2013, T -4.5 forearm 04/2015  . Stroke [facility])     Tobacco Use: History  Smoking Status  . Former Smoker  . Types: Cigarettes  Smokeless Tobacco  . Never Used    Labs: Recent Review Flowsheet Data    Labs for ITP  Cardiac and Pulmonary Rehab Latest Ref Rng & Units 03/28/2015 02/24/2016 02/24/2016 02/24/2016 03/18/2016   Cholestrol 0 - 200 mg/dL 140 113 99 - -   LDLCALC 0 - 99 mg/dL 71 49 47 - -   HDL >40 mg/dL 57.50 48 42 - -   Trlycerides <150 mg/dL 62.0 80 48 - -   Hemoglobin A1c 4.8 - 5.6 % 6.1 - 5.9(H) - -   PHART 7.350 - 7.400 - - - - -   PCO2ART 35.0 - 45.0 mmHg - - - - -   HCO3 20.0 - 24.0 mEq/L - - - - -   TCO2 0 - 100 mmol/L - - - 30 27   O2SAT % - - - - -       Exercise Target Goals:    Exercise Program Goal: Individual exercise prescription set with THRR, safety & activity barriers. Participant demonstrates ability to understand and report RPE using BORG scale, to self-measure pulse accurately, and to acknowledge the importance of the exercise prescription.  Exercise Prescription Goal: Starting with aerobic activity 30 plus minutes a day, 3 days per week for initial exercise prescription. Provide home exercise prescription and guidelines that participant acknowledges understanding prior to discharge.  Activity Barriers & Risk Stratification:     Activity Barriers & Cardiac Risk Stratification - 03/09/16 1355      Activity Barriers & Cardiac Risk Stratification   Activity Barriers Deconditioning;Shortness of Breath;Other (comment);Balance Concerns   Comments Sciatic nerve pain, occasional dizziness   Cardiac Risk Stratification High      6 Minute Walk:     6 Minute Walk    Row Name 03/09/16 1350         6 Minute Walk   Phase Initial     Distance 965 feet     Walk Time 6 minutes     # of Rest Breaks 0     MPH 1.83     METS 2.59     RPE 17     Perceived Dyspnea  3     VO2 Peak 9.06     Symptoms Yes (comment)     Comments Sciattic nerve pain, some SOB     Resting HR 58 bpm     Resting BP 130/74     Max Ex. HR 101 bpm     Max Ex. BP 134/74     2 Minute Post BP 128/64       Interval Oxygen   Interval Oxygen? Yes     6 Minute Oxygen Saturation % 95 %     6 Minute  Liters of Oxygen 0 L  Room Air        Initial Exercise Prescription:     Initial Exercise Prescription - 03/09/16 1300      Date of Initial Exercise RX and Referring Provider   Date 03/09/16   Referring Provider Sherren Mocha  MD     Treadmill   MPH 1.6   Grade 0.5   Minutes 15   METs 2.34     NuStep   Level 1  60-80 spm   Minutes 15   METs 2     Arm/Foot Ergometer   Level 1  25-40 rpm   Minutes 15   METs 2     Prescription Details   Frequency (times per week) 3   Duration Progress to 45 minutes of aerobic exercise without signs/symptoms of physical distress     Intensity   THRR 40-80% of Max Heartrate 93-128   Ratings of Perceived Exertion 11-15   Perceived Dyspnea 0-4     Progression   Progression Continue to progress workloads to maintain intensity without signs/symptoms of physical distress.     Resistance Training   Training Prescription Yes   Weight 2 lbs   Reps 10-15      Perform Capillary Blood Glucose checks as needed.  Exercise Prescription Changes:     Exercise Prescription Changes    Row Name 03/19/16 1300             Exercise Review   Progression Yes         Response to Exercise   Blood Pressure (Admit) 132/64       Blood Pressure (Exercise) 134/70       Blood Pressure (Exit) 96/60       Heart Rate (Admit) 83 bpm       Heart Rate (Exercise) 97 bpm       Heart Rate (Exit) 64 bpm         Progression   Progression Continue to progress workloads to maintain intensity without signs/symptoms of physical distress.       Average METs 2.44         Resistance Training   Training Prescription Yes       Weight 2       Reps 10-15         Interval Training   Interval Training No         Treadmill   MPH 1.4       Grade 0       Minutes 15         NuStep   Level 1       Minutes 15       METs 2         Arm/Foot Ergometer   Level 4       Minutes 15          Exercise Comments:     Exercise Comments    Row Name  03/09/16 1357 03/18/16 1719 03/19/16 1309 04/02/16 1107     Exercise Comments Deandre wants to be able to do what she wants and able to go walking again. First full day of exercise!  Patient was oriented to gym and equipment including functions, settings, policies, and procedures.  Patient's individual exercise prescription and treatment plan were reviewed.  All starting workloads were established based on the results of the 6 minute walk test done at initial orientation visit.  The plan for exercise progression was also introduced and progression will be customized based on patient's performance and goals.  Pt was dizzy coming off treadmill today, will continue to monitor. Ms Bundren completed her first day of exercise today.  Equipment safety, infection control and exercise guidelines were reviewed. Ms Pearline Cables called and said she is at home now but is still  weak and cant attend Heart Track 9/6 or 7.       Discharge Exercise Prescription (Final Exercise Prescription Changes):     Exercise Prescription Changes - 03/19/16 1300      Exercise Review   Progression Yes     Response to Exercise   Blood Pressure (Admit) 132/64   Blood Pressure (Exercise) 134/70   Blood Pressure (Exit) 96/60   Heart Rate (Admit) 83 bpm   Heart Rate (Exercise) 97 bpm   Heart Rate (Exit) 64 bpm     Progression   Progression Continue to progress workloads to maintain intensity without signs/symptoms of physical distress.   Average METs 2.44     Resistance Training   Training Prescription Yes   Weight 2   Reps 10-15     Interval Training   Interval Training No     Treadmill   MPH 1.4   Grade 0   Minutes 15     NuStep   Level 1   Minutes 15   METs 2     Arm/Foot Ergometer   Level 4   Minutes 15      Nutrition:  Target Goals: Understanding of nutrition guidelines, daily intake of sodium '1500mg'$ , cholesterol '200mg'$ , calories 30% from fat and 7% or less from saturated fats, daily to have 5 or more  servings of fruits and vegetables.  Biometrics:     Pre Biometrics - 03/09/16 1356      Pre Biometrics   Height 5' 6.5" (1.689 m)   Weight 115 lb 6.4 oz (52.3 kg)   Waist Circumference 31 inches   Hip Circumference 37 inches   Waist to Hip Ratio 0.84 %   BMI (Calculated) 18.4   Single Leg Stand 4.12 seconds       Nutrition Therapy Plan and Nutrition Goals:     Nutrition Therapy & Goals - 03/19/16 0917      Nutrition Therapy   Diet low sodium, easy-to-chew diet for COPD, adeqate calories for weight gain   Protein (specify units) 7oz   Saturated Fats 12 max. grams   Fruits and Vegetables 5 servings/day   Sodium 1500 grams     Personal Nutrition Goals   Personal Goal #1 Eat something every 3 hours throughout the day   Personal Goal #2 Drink 1/2 - 1 bottle Ensure or Boost or other nutrition drink each day   Personal Goal #3 Eat soft foods like pudding, yogurt, mashed potatoes, lean ground or chopped meats   Comments Ms. Sunderlin reports poor appetite, and fatigue from chewing, some foods feel stuck in her throat after swallowing. Provided dietary tips for better breathing, and set goals to improve overall nutrition intake.      Intervention Plan   Intervention Nutrition handout(s) given to patient.   Expected Outcomes Short Term Goal: Understand basic principles of dietary content, such as calories, fat, sodium, cholesterol and nutrients.;Short Term Goal: A plan has been developed with personal nutrition goals set during dietitian appointment.;Long Term Goal: Adherence to prescribed nutrition plan.      Nutrition Discharge: Rate Your Plate Scores:     Nutrition Assessments - 03/09/16 1549      Rate Your Plate Scores   Pre Score 69   Pre Score % 76.6 %      Nutrition Goals Re-Evaluation:   Psychosocial: Target Goals: Acknowledge presence or absence of depression, maximize coping skills, provide positive support system. Participant is able to verbalize types and  ability to use techniques and  skills needed for reducing stress and depression.  Initial Review & Psychosocial Screening:     Initial Psych Review & Screening - 03/09/16 [place]? Yes   Comments Amairany has one son, 3 grandchildren, one brother and niece that are supportive.  Dulcy lists her brother as her emergency contact.  Jillene lives alone and has no church affiliation.  She was recently sitting with a patient, who has dementia.  This was just prior to her heart attack.  She described working with the demenia patient as stressful.  Arden is not currently working.       Barriers   Psychosocial barriers to participate in program There are no identifiable barriers or psychosocial needs.     Screening Interventions   Interventions Encouraged to exercise;Program counselor consult      Quality of Life Scores:     Quality of Life - 03/09/16 1516      Quality of Life Scores   Health/Function Pre 16.97 %   Socioeconomic Pre 20.71 %   Psych/Spiritual Pre 19.93 %   Family Pre 19 %   GLOBAL Pre 18.65 %      PHQ-9: Recent Review Flowsheet Data    Depression screen [facility] 2/9 03/31/2016 03/09/2016 03/09/2016 01/08/2016 04/08/2015   Decreased Interest 0 0 0 0 0   Down, Depressed, Hopeless 0 0 0 0 0   PHQ - 2 Score 0 0 0 0 0   Altered sleeping - 0 - - -   Tired, decreased energy - 0 - - -   Change in appetite - 0 - - -   Feeling bad or failure about yourself  - 0 - - -   Trouble concentrating - 0 - - -   Moving slowly or fidgety/restless - 0 - - -   Suicidal thoughts - 0 - - -   PHQ-9 Score - 0 - - -   Difficult doing work/chores - Not difficult at all - - -      Psychosocial Evaluation and Intervention:   Psychosocial Re-Evaluation:     Psychosocial Re-Evaluation    Row Name 04/08/16 1016             Psychosocial Re-Evaluation   Interventions Encouraged to attend Cardiac Rehabilitation for the exercise       Comments Jacklynn Bara just  called and said she is sorry but she can't attend Cardiac Rehab today since "her doctor wants to see her on Sept 18th. " I asked her what he wanted to see her for and Noheli said he just wants to see me.           Vocational Rehabilitation: Provide vocational rehab assistance to qualifying candidates.   Vocational Rehab Evaluation & Intervention:     Vocational Rehab - 03/09/16 1522      Initial Vocational Rehab Evaluation & Intervention   Assessment shows need for Vocational Rehabilitation No      Education: Education Goals: Education classes will be provided on a weekly basis, covering required topics. Participant will state understanding/return demonstration of topics presented.  Learning Barriers/Preferences:     Learning Barriers/Preferences - 03/09/16 1523      Learning Barriers/Preferences   Learning Barriers Exercise Concerns   Learning Preferences Written Material      Education Topics: General Nutrition Guidelines/Fats and Fiber: -Group instruction provided by verbal, written material, models and posters to present the general guidelines for heart healthy nutrition. Gives  an explanation and review of dietary fats and fiber.   Controlling Sodium/Reading Food Labels: -Group verbal and written material supporting the discussion of sodium use in heart healthy nutrition. Review and explanation with models, verbal and written materials for utilization of the food label.   Exercise Physiology & Risk Factors: - Group verbal and written instruction with models to review the exercise physiology of the cardiovascular system and associated critical values. Details cardiovascular disease risk factors and the goals associated with each risk factor.   Aerobic Exercise & Resistance Training: - Gives group verbal and written discussion on the health impact of inactivity. On the components of aerobic and resistive training programs and the benefits of this training and how to  safely progress through these programs.   Flexibility, Balance, General Exercise Guidelines: - Provides group verbal and written instruction on the benefits of flexibility and balance training programs. Provides general exercise guidelines with specific guidelines to those with heart or lung disease. Demonstration and skill practice provided.   Stress Management: - Provides group verbal and written instruction about the health risks of elevated stress, cause of high stress, and healthy ways to reduce stress.   Depression: - Provides group verbal and written instruction on the correlation between heart/lung disease and depressed mood, treatment options, and the stigmas associated with seeking treatment.   Anatomy & Physiology of the Heart: - Group verbal and written instruction and models provide basic cardiac anatomy and physiology, with the coronary electrical and arterial systems. Review of: AMI, Angina, Valve disease, Heart Failure, Cardiac Arrhythmia, Pacemakers, and the ICD.   Cardiac Procedures: - Group verbal and written instruction and models to describe the testing methods done to diagnose heart disease. Reviews the outcomes of the test results. Describes the treatment choices: Medical Management, Angioplasty, or Coronary Bypass Surgery.   Cardiac Medications: - Group verbal and written instruction to review commonly prescribed medications for heart disease. Reviews the medication, class of the drug, and side effects. Includes the steps to properly store meds and maintain the prescription regimen.   Go Sex-Intimacy & Heart Disease, Get SMART - Goal Setting: - Group verbal and written instruction through game format to discuss heart disease and the return to sexual intimacy. Provides group verbal and written material to discuss and apply goal setting through the application of the S.M.A.R.T. Method.   Other Matters of the Heart: - Provides group verbal, written materials and  models to describe Heart Failure, Angina, Valve Disease, and Diabetes in the realm of heart disease. Includes description of the disease process and treatment options available to the cardiac patient.   Exercise & Equipment Safety: - Individual verbal instruction and demonstration of equipment use and safety with use of the equipment. Flowsheet Row Cardiac Rehab from 03/09/2016 in [facility] Cardiac and Pulmonary Rehab  Date  03/09/16  Educator  D. Joya Gaskins, RN  Instruction Review Code  1- partially meets, needs review/practice      Infection Prevention: - Provides verbal and written material to individual with discussion of infection control including proper hand washing and proper equipment cleaning during exercise session. Flowsheet Row Cardiac Rehab from 03/09/2016 in [facility] Cardiac and Pulmonary Rehab  Date  03/09/16  Educator  D. Joya Gaskins, RN  Instruction Review Code  2- meets goals/outcomes      Falls Prevention: - Provides verbal and written material to individual with discussion of falls prevention and safety. Flowsheet Row Cardiac Rehab from 03/09/2016 in [facility] Cardiac and Pulmonary Rehab  Date  03/09/16  Educator  D. Joya Gaskins, RN  Instruction Review Code  2- meets goals/outcomes      Diabetes: - Individual verbal and written instruction to review signs/symptoms of diabetes, desired ranges of glucose level fasting, after meals and with exercise. Advice that pre and post exercise glucose checks will be done for 3 sessions at entry of program.    Knowledge Questionnaire Score:     Knowledge Questionnaire Score - 03/09/16 1521      Knowledge Questionnaire Score   Pre Score 16/28      Core Components/Risk Factors/Patient Goals at Admission:     Personal Goals and Risk Factors at Admission - 03/09/16 1527      Core Components/Risk Factors/Patient Goals on Admission    Weight Management Weight Gain  Iceis does not want to lose any weight.  She would like to gain a few pounds.      Sedentary Yes   Intervention Provide advice, education, support and counseling about physical activity/exercise needs.;Develop an individualized exercise prescription for aerobic and resistive training based on initial evaluation findings, risk stratification, comorbidities and participant's personal goals.   Expected Outcomes Achievement of increased cardiorespiratory fitness and enhanced flexibility, muscular endurance and strength shown through measurements of functional capacity and personal statement of participant.   Increase Strength and Stamina Yes   Intervention Provide advice, education, support and counseling about physical activity/exercise needs.;Develop an individualized exercise prescription for aerobic and resistive training based on initial evaluation findings, risk stratification, comorbidities and participant's personal goals.   Expected Outcomes Achievement of increased cardiorespiratory fitness and enhanced flexibility, muscular endurance and strength shown through measurements of functional capacity and personal statement of participant.   Improve shortness of breath with ADL's Yes   Intervention Provide education, individualized exercise plan and daily activity instruction to help decrease symptoms of SOB with activities of daily living.   Expected Outcomes Short Term: Achieves a reduction of symptoms when performing activities of daily living.   Hypertension Yes   Intervention Provide education on lifestyle modifcations including regular physical activity/exercise, weight management, moderate sodium restriction and increased consumption of fresh fruit, vegetables, and low fat dairy, alcohol moderation, and smoking cessation.;Monitor prescription use compliance.   Expected Outcomes Short Term: Continued assessment and intervention until BP is < 140/15m HG in hypertensive participants. < 130/829mHG in hypertensive participants with diabetes, heart failure or chronic kidney  disease.;Long Term: Maintenance of blood pressure at goal levels.   Lipids Yes   Intervention Provide education and support for participant on nutrition & aerobic/resistive exercise along with prescribed medications to achieve LDL '70mg'$ , HDL >'40mg'$ .   Expected Outcomes Short Term: Participant states understanding of desired cholesterol values and is compliant with medications prescribed. Participant is following exercise prescription and nutrition guidelines.;Long Term: Cholesterol controlled with medications as prescribed, with individualized exercise RX and with personalized nutrition plan. Value goals: LDL < '70mg'$ , HDL > 40 mg.      Core Components/Risk Factors/Patient Goals Review:    Core Components/Risk Factors/Patient Goals at Discharge (Final Review):    ITP Comments:     ITP Comments    Row Name 03/09/16 1533 03/11/16 0828 03/19/16 1310 04/02/16 1108 04/08/16 0701   ITP Comments WaCarrollas a history of lung cancer.  She is SOB at times and stated right before her last heart attack she was very SOB.  WaEllysiaould like to increase her strength and stamina; decrease her SOB; maintain her current weight or gain a few pounds; control her blood pressure and lipids  through exercise and the cardiac rehab program.  She denies any chest pain since her STEMI on 02/24/2016.   30 day review. Continue with ITP unless changes noted by Medical Director at signature of review. Ms Dvorsky completed her first day of exercise today.  Equipment safety, infection control and exercise guidelines were reviewed. Ms Pearline Cables called and said she is at home now but is still weak and cant attend Heart Track 9/6 or 7. 30 day review. Continue with ITP unless changes noted by Medical Director at signature of review.   Kite Name 04/08/16 1016 04/14/16 1456 05/06/16 1159       ITP Comments Jeanee Bennis just called and said she is sorry but she can't attend Cardiac Rehab today since "her doctor wants to see her on Sept 18th. " I  asked her what he wanted to see her for and [place] said he just wants to see me.  Note from doctor to discharge pt from program. discharged        Comments:

## 2016-05-07 ENCOUNTER — Other Ambulatory Visit (INDEPENDENT_AMBULATORY_CARE_PROVIDER_SITE_OTHER): Payer: PPO

## 2016-05-07 ENCOUNTER — Encounter: Payer: Self-pay | Admitting: *Deleted

## 2016-05-07 DIAGNOSIS — R636 Underweight: Secondary | ICD-10-CM | POA: Diagnosis not present

## 2016-05-07 DIAGNOSIS — E440 Moderate protein-calorie malnutrition: Secondary | ICD-10-CM | POA: Diagnosis not present

## 2016-05-07 LAB — FECAL OCCULT BLOOD, GUAIAC: Fecal Occult Blood: NEGATIVE

## 2016-05-07 LAB — FECAL OCCULT BLOOD, IMMUNOCHEMICAL: Fecal Occult Bld: NEGATIVE

## 2016-05-08 ENCOUNTER — Ambulatory Visit (HOSPITAL_BASED_OUTPATIENT_CLINIC_OR_DEPARTMENT_OTHER): Payer: PPO | Admitting: Adult Health

## 2016-05-08 VITALS — BP 129/74 | HR 69 | Temp 98.0°F | Resp 18 | Ht 66.0 in | Wt 117.0 lb

## 2016-05-08 DIAGNOSIS — Z85118 Personal history of other malignant neoplasm of bronchus and lung: Secondary | ICD-10-CM

## 2016-05-08 DIAGNOSIS — K1370 Unspecified lesions of oral mucosa: Secondary | ICD-10-CM

## 2016-05-08 DIAGNOSIS — Z8673 Personal history of transient ischemic attack (TIA), and cerebral infarction without residual deficits: Secondary | ICD-10-CM

## 2016-05-08 DIAGNOSIS — R634 Abnormal weight loss: Secondary | ICD-10-CM

## 2016-05-08 DIAGNOSIS — I252 Old myocardial infarction: Secondary | ICD-10-CM

## 2016-05-08 DIAGNOSIS — C3431 Malignant neoplasm of lower lobe, right bronchus or lung: Secondary | ICD-10-CM

## 2016-05-11 ENCOUNTER — Encounter: Payer: Self-pay | Admitting: Adult Health

## 2016-05-11 ENCOUNTER — Telehealth: Payer: Self-pay | Admitting: Family Medicine

## 2016-05-11 NOTE — Telephone Encounter (Signed)
See lab result note from 10/3 - ok to reschedule prolia.

## 2016-05-11 NOTE — Telephone Encounter (Signed)
Are you still holding Ms. Petre' Prolia, or can it now be scheduled?  Thank you!

## 2016-05-11 NOTE — Progress Notes (Signed)
CLINIC:  Survivorship  REASON FOR VISIT:  Long-term survivorship surveillance visit for patient with history of lung cancer.    BRIEF ONCOLOGIC HISTORY:  (From Ned Card, NP's last note dated   INTERVAL HISTORY:  Ms. Gorka report to the cancer center for continued surveillance for her history of stage I lung cancer. She tells me that overall she feels pretty well. Since her visit here last year she did have a heart attack in 01/2016. She is followed closely by cardiology.  She also tells me that she has a history of having had several "small strokes." She continues to have chronic shortness of breath with exertion, as well as a productive cough. These symptoms are largely unchanged from previous. She continues to abstain from tobacco.  One of her big her concerns today is weight loss. She tells me that she has lost about 30 pounds. She is unsure of the timeframe of when she lost this weight. Upon further questioning she tells me that she weighed between 115-120 pounds back in 2014. She said she used to weigh in the 140s. She tells me her lowest weight was 111 pounds, in 01/2016 shortly after her heart attack. Her weight loss has been unintended. 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 She reports that she is consistently eating 3 times per day now, and is supplementing her diet with Ensure/Boost 2 times per day. She is committed to wanting to gain weight.  Health maintenance, she tells me she has artery had her flu shot for this year. She is due for her mammogram at the [facility] soon. She tells me she needs to call them to get the appointment scheduled for the mammogram.   ADDITIONAL REVIEW OF SYSTEMS:  Review of Systems  Constitutional: Negative.   HENT: Negative.         Dentures.  Eyes: Negative.   Respiratory: Positive for cough and shortness of breath.   Cardiovascular: Negative.  Negative for chest pain.  Gastrointestinal: Negative.   Genitourinary: Negative.   Musculoskeletal: Negative.   Skin: Negative.   Neurological: Negative.   Endo/Heme/Allergies: Negative.   Psychiatric/Behavioral: Negative.      PAST MEDICAL & SURGICAL HISTORY:  Past Medical History:  Diagnosis Date  . Arthritis   . CAD (coronary artery disease) 03/2009   s/p MI  . Chronic combined systolic and diastolic heart failure ([place]) 06/17/2009   Qualifier: Diagnosis of  By: Burt Knack, MD, Clayburn Pert   . CKD (chronic kidney disease) stage 3, GFR 30-59 ml/min   . Emphysema/COPD ([place])   . Ex-smoker quit 2008  . GERD (gastroesophageal reflux disease)   . HCAP (healthcare-associated pneumonia) 03/19/2016  . Helicobacter pylori gastritis 10/2007   treated  . History of CVA (cerebrovascular accident) without residual deficits 01/2012, 06/2012   R hemorrhagic MCA 01/2012 with remote lacunar infarct L putamen and IC, rpt 06/2012 acute multifocal R MCA infarct with remote hemorrhagic strokes affecting L basal ganglia and periventricular white matter, full recovery  . HLD (hyperlipidemia)   . HTN (hypertension)   . Ischemic cardiomyopathy 02/27/2014  . Lung cancer ([place]) dx'd 07/2006   Lung CA, s/p resection, followed by Dr Earlie Server  . Macular degeneration   . Myocardial infarction 03/2009   Acute myocardial  infarction 2010 - treated with BMS of LCx. LVEF 50% with subsequent CHF  . Osteoporosis 09/2013   T -3.6 forearm 09/2013, T -4.5 forearm 04/2015  . Stroke [facility])    Past Surgical History:  Procedure Laterality Date  . BREAST BIOPSY Left   . CARDIAC CATHETERIZATION N/A 02/24/2016   Procedure: Left Heart Cath and Coronary Angiography;  Surgeon: Belva Crome, MD;  Location: [facility];  Service: Cardiovascular;  Laterality: N/A;  . CATARACT EXTRACTION Bilateral   .  CORONARY STENT PLACEMENT    . EYE SURGERY    . LUNG REMOVAL, PARTIAL Right 2008  . PARTIAL HYSTERECTOMY  1986   irregular periods, ovaries remain     CURRENT MEDICATIONS:  Current Outpatient Prescriptions on File Prior to Visit  Medication Sig Dispense Refill  . aspirin EC 81 MG tablet Take 1 tablet (81 mg total) by mouth daily.    . budesonide (PULMICORT) 0.5 MG/2ML nebulizer solution Take 2 mLs (0.5 mg total) by nebulization 2 (two) times daily. 120 mL 5  . Calcium Carbonate-Vitamin D (CALCIUM-VITAMIN D) 500-200 MG-UNIT per tablet Take 1 tablet by mouth 2 (two) times daily.     . Cholecalciferol (VITAMIN D) 2000 UNITS CAPS Take 1 capsule by mouth daily.    . clopidogrel (PLAVIX) 75 MG tablet Take 1 tablet (75 mg total) by mouth daily. 30 tablet 12  . furosemide (LASIX) 40 MG tablet Take 0.5 tablets (20 mg total) by mouth daily.    Marland Kitchen ipratropium-albuterol (DUONEB) 0.5-2.5 (3) MG/3ML SOLN Take 3 mLs by nebulization 2 (two) times daily.    . isosorbide mononitrate (IMDUR) 30 MG 24 hr tablet TAKE 1 TABLET BY MOUTH DAILY 30 tablet 1  . LOTEMAX 0.5 % ophthalmic suspension Place 1 drop into both eyes daily.     . pantoprazole (PROTONIX) 40 MG tablet Take 1 tablet (40 mg total) by mouth daily. 30 tablet 11  . simvastatin (ZOCOR) 40 MG tablet TAKE ONE TABLET BY MOUTH AT BEDTIME 30 tablet 11  . spironolactone (ALDACTONE) 25 MG tablet TAKE 1/2 TABLET BY MOUTH ONCE DAILY 30 tablet 11  . albuterol (PROAIR HFA) 108 (90 Base) MCG/ACT inhaler Inhale 2 puffs into the lungs every 4 (four) hours as needed for wheezing or shortness of breath. (Patient not taking: Reported on 05/08/2016) 1 Inhaler 1  . denosumab (PROLIA) 60 MG/ML SOLN injection Inject 60 mg into the skin every 6 (six) months. Administer in upper arm, thigh, or abdomen (Patient not taking: Reported on 05/08/2016) 60 mL 1  . nitroGLYCERIN (NITROSTAT) 0.4 MG SL tablet Place 1 tablet (0.4 mg total) under the tongue every 5 (five) minutes as needed  for chest pain. (Patient not taking: Reported on 05/08/2016) 25 tablet 3   No current facility-administered medications on file prior to visit.     ALLERGIES:  Allergies  Allergen Reactions  . Actonel [Risedronate Sodium] Other (See Comments)    Headache  . Amlodipine Other (See Comments)    Pedal edema  . Fosamax [Alendronate Sodium] Other (See Comments)    Unable to tolerate  . Lisinopril Cough  . Pravastatin Other (See Comments)    Constipation.  . Codeine Rash    PHYSICAL EXAM:  Vitals:   05/08/16 1343  BP: 129/74  Pulse: 69  Resp: 18  Temp: 98 F (36.7 C)   Filed Weights   05/08/16 1343  Weight: 117 lb (53.1 kg)   Weight trends:  05/14/15: 124 lb 4.8 oz (56.382 kg) 06/03/15:  128 lb (58.1 kg) 09/05/15: 122 lb (55.3 kg) 01/08/16:119 lb (54 kg)   04/13/16: 116 lb 6.4 oz (52.8 kg)  General: Thin, chronically-ill appearing female in no acute distress. Unaccompanied today.  HEENT: Head is atraumatic and normocephalic.  Pupils equal and reactive to light. Conjunctivae clear without exudate.  Sclerae anicteric. Oral mucosa is pink and moist. Oropharynx is pink without lesions.  Right tip of tongue with sub-cm exophytic flesh-colored lesion; no firm palpable mass to this area, the base of tongue, or buccal mucosa.  Lymph: No cervical, supraclavicular, or supraclavicular lymphadenopathy noted on palpation.   Cardiovascular: Irregular rhythm.  Respiratory: Diminished breath sounds throughout. Breathing non-labored.    GI: Abdomen soft and round. No tenderness to palpation. Bowel sounds normoactive in 4 quadrants. No hepatosplenomegaly.  GU: Deferred.   Neuro: No focal deficits. Steady gait.   Psych: Normal mood and affect for situation. Extremities: No edema.  Skin: Warm and dry.    LABORATORY DATA:  None at this visit.  DIAGNOSTIC IMAGING:  Most recent CT chest: 03/19/16   PET scan: 05/31/15     ASSESSMENT & PLAN:  Ms. Rader is a pleasant 75 y.o. female with  history of Stage IA (T1aN0M0) NSCLC, adenocarcinoma, diagnosed in 05/2006; treated with right lower lobe superior segmentectomy with lymph node dissection under the care of Dr. Arlyce Dice on 08/15/2006. She has been without evidence of disease since that time. Patient presents to survivorship clinic today for routine surveillance as a long-term cancer survivor.   1. History of Stage I lung cancer: Ms. Finkel is radiographically without evidence of disease. She sees Dr. Lamonte Sakai, with pulmonology, it every 3 months. He monitors her lung disease at regular intervals with H&P and serial CT imaging.  We discussed that from a lung cancer standpoint, she is doing quite well, now nearly 10 years out from her lung cancer diagnosis.  I shared with her that this is very favorable, given her early stage lung cancer. Therefore, we discussed the option of having her "graduate" from follow-up here at the cancer center. I shared with her that I feel comfortable with this plan, as she has many specialists involved in her care who are seeing her regularly. She is having imaging quite often as well.  I informed her that, of course, she is welcome to return to the cancer center should she have any questions or new concerns in the future. She is agreeable to this plan, as she has several doctors' appointments pretty often, and is pleased to have one last visit here at the cancer center annually.  2. Weight loss, possibly secondary to recent cardiac event/stress: Ms. Wallick is beginning to gain a bit of weight, which is encouraging.  She tells me her lowest weight was about 111 lbs.  She now weighs about 117 lbs.  I reviewed her previous weights for the past year and her weight has largely been stable within about 5-7 lbs.  She is now eating 3 meals/day and is supplementing with Boost/Ensure 2x/day.  Her PCP is following her closely and helping to manage her malnutrition.  Her PET scan from 05/2015 is encouraging that her weight loss is not  related to possible metastatic disease.  Also, her most recent CT chest did not suggest evidence of malignancy as well.  Therefore, her weight loss is likely secondary to recent stressors.  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  Ms. Bruning also suffered a heart attack in 01/2016 and has a history of several TIAs. I encouraged her to continue her efforts to eat high protein/high calorie foods, as tolerated, to continue to gain weight.   3. Exophytic tongue lesion: She tells me she sees her dentist regularly. She does have a small exophytic lesion on the right tip of the tongue; there are no firm masses in this area. She does have a history of smoking, which certainly increases her risk for head and neck cancers. I encouraged her to follow-up with her dentist soon regarding this likely benign tongue lesion.  4. Health maintenance/Follow-up with dentist: Encouraged Ms. Salberg to maintain her follow-up with her PCP and other specialists. She did have her flu shot for this year.  Encouraged her to call and get her mammogram scheduled soon.   Dispo: -"Graduate" from follow-up here at the cancer center. She is welcome to return at any time in the future, as needed. I wished her well and encouraged her to call with any questions or concerns.    Mike Craze, NP [place] [phone]

## 2016-05-12 NOTE — Telephone Encounter (Signed)
I mailed her a letter advising it was ok to schedule. She has a nurse visit for injection scheduled for 05/14/16

## 2016-05-14 ENCOUNTER — Ambulatory Visit (INDEPENDENT_AMBULATORY_CARE_PROVIDER_SITE_OTHER): Payer: PPO

## 2016-05-14 DIAGNOSIS — M810 Age-related osteoporosis without current pathological fracture: Secondary | ICD-10-CM | POA: Diagnosis not present

## 2016-05-14 MED ORDER — DENOSUMAB 60 MG/ML ~~LOC~~ SOLN
60.0000 mg | Freq: Once | SUBCUTANEOUS | Status: AC
  Administered 2016-05-14: 60 mg via SUBCUTANEOUS

## 2016-05-24 ENCOUNTER — Other Ambulatory Visit: Payer: Self-pay | Admitting: Cardiovascular Disease

## 2016-06-01 ENCOUNTER — Other Ambulatory Visit: Payer: Self-pay | Admitting: Cardiovascular Disease

## 2016-06-01 MED ORDER — FUROSEMIDE 20 MG PO TABS: 20.0000 mg | ORAL_TABLET | Freq: Every day | ORAL | 3 refills | Status: DC

## 2016-06-01 NOTE — Telephone Encounter (Signed)
Refill request received from [facility] for Furosemide '40mg'$  one daily.  Per pt's chart the pt is taking '20mg'$  daily. I contacted the pt and confirmed that she is taking '20mg'$  daily.  The pt prefers to get the '20mg'$  tablet so she does not have to continue cutting her tablet.  New Rx sent to the pharmacy.

## 2016-06-02 ENCOUNTER — Telehealth: Payer: Self-pay | Admitting: Cardiovascular Disease

## 2016-06-02 MED ORDER — FUROSEMIDE 20 MG PO TABS: 20.0000 mg | ORAL_TABLET | Freq: Every day | ORAL | 3 refills | Status: DC

## 2016-06-02 NOTE — Telephone Encounter (Signed)
I spoke with the pt and she called to confirm what pharmacy the Furosemide was sent in to yesterday. I made her aware [facility]. The pt said she uses 2 different pharmacies and the Furosemide needs to be sent to [facility].  I updated the pt's chart and sent in a new Rx.

## 2016-06-02 NOTE — Telephone Encounter (Signed)
New message    Patient returning nurse called from yesterday

## 2016-06-05 ENCOUNTER — Telehealth: Payer: Self-pay

## 2016-06-05 NOTE — Telephone Encounter (Signed)
[name] said today at lunchtime pt noticed brownish phlegm with prod cough; No more SOB than usual. No wheezing and pt has not taken temp. Pt is using nebulizer and that helps. Pt scheduled appt at [facility] Sat clinic 06/06/16 at 10:15; if pt condition changes or worsens prior to appt pt will go to [facility] or ED. FYI to Dr Darnell Level.

## 2016-06-05 NOTE — Telephone Encounter (Signed)
Agree. Noted. Thanks.

## 2016-06-06 ENCOUNTER — Ambulatory Visit (INDEPENDENT_AMBULATORY_CARE_PROVIDER_SITE_OTHER): Payer: PPO | Admitting: Family Medicine

## 2016-06-06 ENCOUNTER — Encounter: Payer: Self-pay | Admitting: Family Medicine

## 2016-06-06 VITALS — BP 132/82 | HR 72 | Temp 97.8°F | Resp 16 | Ht 66.0 in | Wt 117.0 lb

## 2016-06-06 DIAGNOSIS — J441 Chronic obstructive pulmonary disease with (acute) exacerbation: Secondary | ICD-10-CM | POA: Diagnosis not present

## 2016-06-06 MED ORDER — DOXYCYCLINE HYCLATE 100 MG PO CAPS
100.0000 mg | ORAL_CAPSULE | Freq: Two times a day (BID) | ORAL | 0 refills | Status: DC
Start: 2016-06-06 — End: 2016-06-22

## 2016-06-06 NOTE — Progress Notes (Signed)
Pre visit review using our clinic review tool, if applicable. No additional management support is needed unless otherwise documented below in the visit note. 

## 2016-06-06 NOTE — Progress Notes (Signed)
Subjective:    Patient ID: [name], female    DOB: [birth year], 75 y.o.   MRN: [id number]  HPI Patient is a former smoker with known COPD who is seen with 2-3 day history of cough. She's had some productive cough with brownish sputum intermittently. No fever. May have some very mild wheezing intermittently. Has sore throat past couple days but not today. Cough is relatively mild. She states her breathing is "at baseline ". She uses budesonide nebulizer regularly and also has rescue inhaler which she has used past couple days. She is able to ambulate within the home without much difficulty. No nausea or vomiting.  Past Medical History:  Diagnosis Date  . Arthritis   . CAD (coronary artery disease) 03/2009   s/p MI  . Chronic combined systolic and diastolic heart failure ([place]) 06/17/2009   Qualifier: Diagnosis of  By: Burt Knack, MD, Clayburn Pert   . CKD (chronic kidney disease) stage 3, GFR 30-59 ml/min   . Emphysema/COPD ([place])   . Ex-smoker quit 2008  . GERD (gastroesophageal reflux disease)   . HCAP (healthcare-associated pneumonia) 03/19/2016  . Helicobacter pylori gastritis 10/2007   treated  . History of CVA (cerebrovascular accident) without residual deficits 01/2012, 06/2012   R hemorrhagic MCA 01/2012 with remote lacunar infarct L putamen and IC, rpt 06/2012 acute multifocal R MCA infarct with remote hemorrhagic strokes affecting L basal ganglia and periventricular white matter, full recovery  . HLD (hyperlipidemia)   . HTN (hypertension)   . Ischemic cardiomyopathy 02/27/2014  . Lung cancer ([place]) dx'd 07/2006   Lung CA, s/p resection, followed by Dr Earlie Server  . Macular degeneration   . Myocardial infarction 03/2009   Acute myocardial infarction 2010 - treated with BMS of LCx. LVEF 50% with subsequent CHF  . Osteoporosis 09/2013   T -3.6 forearm 09/2013, T -4.5 forearm 04/2015  . Stroke [facility])    Past Surgical History:  Procedure Laterality Date  . BREAST BIOPSY Left   . CARDIAC  CATHETERIZATION N/A 02/24/2016   Procedure: Left Heart Cath and Coronary Angiography;  Surgeon: Belva Crome, MD;  Location: [facility];  Service: Cardiovascular;  Laterality: N/A;  . CATARACT EXTRACTION Bilateral   . CORONARY STENT PLACEMENT    . EYE SURGERY    . LUNG REMOVAL, PARTIAL Right 2008  . PARTIAL HYSTERECTOMY  1986   irregular periods, ovaries remain    reports that she has quit smoking. Her smoking use included Cigarettes. She has never used smokeless tobacco. She reports that she does not drink alcohol or use drugs. family history includes Breast cancer in her sister; CAD (age of onset: 68) in her father and mother; Diabetes in her sister; Heart attack in her mother; Hypertension in her mother; Stroke in her father and mother. Allergies  Allergen Reactions  . Actonel [Risedronate Sodium] Other (See Comments)    Headache  . Amlodipine Other (See Comments)    Pedal edema  . Fosamax [Alendronate Sodium] Other (See Comments)    Unable to tolerate  . Lisinopril Cough  . Pravastatin Other (See Comments)    Constipation.  . Codeine Rash      Review of Systems  Constitutional: Negative for chills and fever.  Respiratory: Positive for cough.   Cardiovascular: Negative for chest pain and leg swelling.  Gastrointestinal: Negative for nausea and vomiting.  Psychiatric/Behavioral: Negative for confusion.       Objective:   Physical Exam  Constitutional: She appears well-developed and well-nourished.  HENT:  Mouth/Throat: Oropharynx is clear and moist.  Cardiovascular: Normal rate and regular rhythm.   Pulmonary/Chest:  Slightly diminished breath sounds throughout. No active wheezes. No rales. No respiratory distress. Pulse oximetry 95%  Musculoskeletal: She exhibits no edema.          Assessment & Plan:  Acute exacerbation of COPD. Patient is in no respiratory distress. Start doxycycline 100 mg twice a day for 10 days. Stay well-hydrated. Continue her usual  inhalers. Follow-up promptly for any fever or worsening symptoms.  We did not hear any wheezing at this time and decided not to add oral steroids.  Eulas Post MD [facility] at [facility]

## 2016-06-06 NOTE — Patient Instructions (Signed)
Chronic Obstructive Pulmonary Disease Chronic obstructive pulmonary disease (COPD) is a common lung condition in which airflow from the lungs is limited. COPD is a general term that can be used to describe many different lung problems that limit airflow, including both chronic bronchitis and emphysema. If you have COPD, your lung function will probably never return to normal, but there are measures you can take to improve lung function and make yourself feel better. CAUSES   Smoking (common).  Exposure to secondhand smoke.  Genetic problems.  Chronic inflammatory lung diseases or recurrent infections. SYMPTOMS  Shortness of breath, especially with physical activity.  Deep, persistent (chronic) cough with a large amount of thick mucus.  Wheezing.  Rapid breaths (tachypnea).  Gray or bluish discoloration (cyanosis) of the skin, especially in your fingers, toes, or lips.  Fatigue.  Weight loss.  Frequent infections or episodes when breathing symptoms become much worse (exacerbations).  Chest tightness. DIAGNOSIS Your health care provider will take a medical history and perform a physical examination to diagnose COPD. Additional tests for COPD may include:  Lung (pulmonary) function tests.  Chest X-ray.  CT scan.  Blood tests. TREATMENT  Treatment for COPD may include:  Inhaler and nebulizer medicines. These help manage the symptoms of COPD and make your breathing more comfortable.  Supplemental oxygen. Supplemental oxygen is only helpful if you have a low oxygen level in your blood.  Exercise and physical activity. These are beneficial for nearly all people with COPD.  Lung surgery or transplant.  Nutrition therapy to gain weight, if you are underweight.  Pulmonary rehabilitation. This may involve working with a team of health care providers and specialists, such as respiratory, occupational, and physical therapists. HOME CARE INSTRUCTIONS  Take all medicines  (inhaled or pills) as directed by your health care provider.  Avoid over-the-counter medicines or cough syrups that dry up your airway (such as antihistamines) and slow down the elimination of secretions unless instructed otherwise by your health care provider.  If you are a smoker, the most important thing that you can do is stop smoking. Continuing to smoke will cause further lung damage and breathing trouble. Ask your health care provider for help with quitting smoking. He or she can direct you to community resources or hospitals that provide support.  Avoid exposure to irritants such as smoke, chemicals, and fumes that aggravate your breathing.  Use oxygen therapy and pulmonary rehabilitation if directed by your health care provider. If you require home oxygen therapy, ask your health care provider whether you should purchase a pulse oximeter to measure your oxygen level at home.  Avoid contact with individuals who have a contagious illness.  Avoid extreme temperature and humidity changes.  Eat healthy foods. Eating smaller, more frequent meals and resting before meals may help you maintain your strength.  Stay active, but balance activity with periods of rest. Exercise and physical activity will help you maintain your ability to do things you want to do.  Preventing infection and hospitalization is very important when you have COPD. Make sure to receive all the vaccines your health care provider recommends, especially the pneumococcal and influenza vaccines. Ask your health care provider whether you need a pneumonia vaccine.  Learn and use relaxation techniques to manage stress.  Learn and use controlled breathing techniques as directed by your health care provider. Controlled breathing techniques include:  Pursed lip breathing. Start by breathing in (inhaling) through your nose for 1 second. Then, purse your lips as if you were   going to whistle and breathe out (exhale) through the  pursed lips for 2 seconds.  Diaphragmatic breathing. Start by putting one hand on your abdomen just above your waist. Inhale slowly through your nose. The hand on your abdomen should move out. Then purse your lips and exhale slowly. You should be able to feel the hand on your abdomen moving in as you exhale.  Learn and use controlled coughing to clear mucus from your lungs. Controlled coughing is a series of short, progressive coughs. The steps of controlled coughing are: 1. Lean your head slightly forward. 2. Breathe in deeply using diaphragmatic breathing. 3. Try to hold your breath for 3 seconds. 4. Keep your mouth slightly open while coughing twice. 5. Spit any mucus out into a tissue. 6. Rest and repeat the steps once or twice as needed. SEEK MEDICAL CARE IF:  You are coughing up more mucus than usual.  There is a change in the color or thickness of your mucus.  Your breathing is more labored than usual.  Your breathing is faster than usual. SEEK IMMEDIATE MEDICAL CARE IF:  You have shortness of breath while you are resting.  You have shortness of breath that prevents you from:  Being able to talk.  Performing your usual physical activities.  You have chest pain lasting longer than 5 minutes.  Your skin color is more cyanotic than usual.  You measure low oxygen saturations for longer than 5 minutes with a pulse oximeter. MAKE SURE YOU:  Understand these instructions.  Will watch your condition.  Will get help right away if you are not doing well or get worse.   This information is not intended to replace advice given to you by your health care provider. Make sure you discuss any questions you have with your health care provider.   Document Released: 04/22/2005 Document Revised: 08/03/2014 Document Reviewed: 03/09/2013 Elsevier Interactive Patient Education 2016 [employer] Inc.  

## 2016-06-07 ENCOUNTER — Other Ambulatory Visit: Payer: Self-pay | Admitting: Cardiovascular Disease

## 2016-06-08 ENCOUNTER — Ambulatory Visit
Admission: RE | Admit: 2016-06-08 | Discharge: 2016-06-08 | Disposition: A | Discharge status: Home / Self Care | Payer: PPO | Source: Ambulatory Visit | Attending: Family Medicine | Admitting: Family Medicine

## 2016-06-08 ENCOUNTER — Other Ambulatory Visit: Payer: PPO

## 2016-06-08 ENCOUNTER — Ambulatory Visit: Payer: PPO | Admitting: Physician Assistant

## 2016-06-08 DIAGNOSIS — Z1231 Encounter for screening mammogram for malignant neoplasm of breast: Secondary | ICD-10-CM | POA: Diagnosis not present

## 2016-06-09 LAB — HM MAMMOGRAPHY

## 2016-06-10 ENCOUNTER — Encounter: Payer: Self-pay | Admitting: *Deleted

## 2016-06-22 ENCOUNTER — Encounter: Payer: Self-pay | Admitting: Physician Assistant

## 2016-06-22 ENCOUNTER — Ambulatory Visit (INDEPENDENT_AMBULATORY_CARE_PROVIDER_SITE_OTHER): Payer: PPO | Admitting: Physician Assistant

## 2016-06-22 ENCOUNTER — Other Ambulatory Visit: Payer: PPO | Admitting: *Deleted

## 2016-06-22 ENCOUNTER — Encounter (INDEPENDENT_AMBULATORY_CARE_PROVIDER_SITE_OTHER): Payer: Self-pay

## 2016-06-22 ENCOUNTER — Ambulatory Visit (INDEPENDENT_AMBULATORY_CARE_PROVIDER_SITE_OTHER)
Admission: RE | Admit: 2016-06-22 | Discharge: 2016-06-22 | Disposition: A | Discharge status: Home / Self Care | Payer: PPO | Source: Ambulatory Visit | Attending: Emergency Medicine | Admitting: Emergency Medicine

## 2016-06-22 VITALS — BP 140/60 | HR 64 | Ht 66.0 in | Wt 118.8 lb

## 2016-06-22 DIAGNOSIS — I25119 Atherosclerotic heart disease of native coronary artery with unspecified angina pectoris: Secondary | ICD-10-CM | POA: Diagnosis not present

## 2016-06-22 DIAGNOSIS — R002 Palpitations: Secondary | ICD-10-CM

## 2016-06-22 DIAGNOSIS — E7800 Pure hypercholesterolemia, unspecified: Secondary | ICD-10-CM | POA: Diagnosis not present

## 2016-06-22 DIAGNOSIS — I5022 Chronic systolic (congestive) heart failure: Secondary | ICD-10-CM | POA: Diagnosis not present

## 2016-06-22 DIAGNOSIS — I10 Essential (primary) hypertension: Secondary | ICD-10-CM

## 2016-06-22 DIAGNOSIS — I5042 Chronic combined systolic (congestive) and diastolic (congestive) heart failure: Secondary | ICD-10-CM | POA: Diagnosis not present

## 2016-06-22 DIAGNOSIS — R911 Solitary pulmonary nodule: Secondary | ICD-10-CM

## 2016-06-22 DIAGNOSIS — J431 Panlobular emphysema: Secondary | ICD-10-CM

## 2016-06-22 DIAGNOSIS — J449 Chronic obstructive pulmonary disease, unspecified: Secondary | ICD-10-CM | POA: Diagnosis not present

## 2016-06-22 LAB — BASIC METABOLIC PANEL
BUN: 15 mg/dL (ref 7–25)
CALCIUM: 9.1 mg/dL (ref 8.6–10.4)
CO2: 31 mmol/L (ref 20–31)
Chloride: 102 mmol/L (ref 98–110)
Creat: 0.81 mg/dL (ref 0.60–0.93)
GLUCOSE: 71 mg/dL (ref 65–99)
POTASSIUM: 4.2 mmol/L (ref 3.5–5.3)
Sodium: 139 mmol/L (ref 135–146)

## 2016-06-22 LAB — BRAIN NATRIURETIC PEPTIDE: BRAIN NATRIURETIC PEPTIDE: 368.4 pg/mL — AB (ref <100)

## 2016-06-22 NOTE — Patient Instructions (Addendum)
Medication Instructions:  Your physician recommends that you continue on your current medications as directed. Please refer to the Current Medication list given to you today.  Labwork: NONE  Testing/Procedures: NONE  Follow-Up: 1. DR. Burt Knack 09/2016; OUR OFFICE WILL SEND OUT A LETTER A COUPLE OF MONTHS EARLY TO HAVE YOU CALL AND MAKE APPT.   Any Other Special Instructions Will Be Listed Below (If Applicable). 1. CALL IF YOUR PALPITATIONS CONTINUE 2. OK TO INCREASE YOUR NEBULIZER TO 3 TIMES A DAY; IF THIS HELPS; PLEASE CALL DR. Brock Ra OFFICE FOR REFILLS   If you need a refill on your cardiac medications before your next appointment, please call your pharmacy.

## 2016-06-22 NOTE — Progress Notes (Signed)
Cardiology Office Note:    Date:  06/22/2016   ID:  [name], DOB [birth year], MRN [id number]  PCP:  Ria Bush, MD  Cardiologist:  Dr. Sherren Mocha   Electrophysiologist:  N/a Pulmonologist: Dr. Lamonte Sakai  Referring MD: Ria Bush, MD   Chief Complaint  Patient presents with  . Follow-up    CAD, CHF    History of Present Illness:    [name] is a 75 y.o. female with a hx of CAD, systolic CHF.  She initially presented with an acute MI in 2010 with LCx occlusion tx with primary PCI with BMS.  She also has a hx of R MCA stroke in 2013, non-small cell lung CA s/p resection, COPD.  She presented in 7/17 with an anterior STEMI and was found to have an apical occlusion of the LAD.  This was treated medically.  She was admitted in 9/17 with chest discomfort. Cardiac markers were normal. She was seen by Dr. Tamala Julian, who did her cardiac catheterization in 7/17. No further workup was recommended. Medical therapy was continued.  Last seen by Dr. Burt Knack 9/17. She has not tolerated beta-blocker or ACE inhibitor given her chronic lung disease.  Follow-up was arranged today.  She is here today with her daughter. She notes fairly stable shortness of breath without significant change.  She does note chronic orthopnea and PND.  She denies edema.  She denies chest pain.  She denies syncope.    Prior CV studies that were reviewed today include:    Echo 03/20/16 EF 40-45, anterolateral and inferolateral hypokinesis, grade 2 diastolic dysfunction, MAC, mild MR, mild LAE, PASP 45  LHC 7/17 LAD proximal 65, distal 100, D1 ostial 65 LCx proximal stent patent with 10% ISR Acute coronary syndrome with occlusion of distal LAD in a small tortuous segment. Widely patent stent in the circumflex. Eccentric 65% stenosis in the mid LAD beyond the first agonal. 60% first diagonal ostial stenosis. Both sites are unchanged from prior angiogram. New finding of very distal LAD total  occlusion. Dominant right coronary artery with luminal irregularities. Moderate left ventricular systolic dysfunction with regional wall motion abnormality including new apical severe hypokinesis and suggestion of anterolateral wall akinesis/dyskinesis (old). Estimated ejection fraction is 35%.  LVEDP is low normal.  Echo (12/13):   EF 45-50%, posterior wall akinesis, lateral wall and inferior wall hypokinesis, mild MR  Carotid US (12/13):   No significant ICA stenosis  Nuclear (10/10):   EF 32%, inf-lat scar; no ischemia  LHC (10/10):   Inferolateral akinesis and dyskinesis, EF 40%, D1 30%, LAD 50-60%, mid CFX stent patent, proximal RCA 30%, mid RCA 50%   Past Medical History:  Diagnosis Date  . Arthritis   . CAD (coronary artery disease) 03/2009   s/p MI  . Chronic combined systolic and diastolic heart failure ([place]) 06/17/2009   Qualifier: Diagnosis of  By: Burt Knack, MD, Clayburn Pert   . CKD (chronic kidney disease) stage 3, GFR 30-59 ml/min   . Emphysema/COPD ([place])   . Ex-smoker quit 2008  . GERD (gastroesophageal reflux disease)   . HCAP (healthcare-associated pneumonia) 03/19/2016  . Helicobacter pylori gastritis 10/2007   treated  . History of CVA (cerebrovascular accident) without residual deficits 01/2012, 06/2012   R hemorrhagic MCA 01/2012 with remote lacunar infarct L putamen and IC, rpt 06/2012 acute multifocal R MCA infarct with remote hemorrhagic strokes affecting L basal ganglia and periventricular white matter, full recovery  . HLD (hyperlipidemia)   . HTN (hypertension)   .  Ischemic cardiomyopathy 02/27/2014  . Lung cancer ([place]) dx'd 07/2006   Lung CA, s/p resection, followed by Dr Earlie Server  . Macular degeneration   . Myocardial infarction 03/2009   Acute myocardial infarction 2010 - treated with BMS of LCx. LVEF 50% with subsequent CHF  . Osteoporosis 09/2013   T -3.6 forearm 09/2013, T -4.5 forearm 04/2015  . Stroke [facility])     Past Surgical History:  Procedure  Laterality Date  . BREAST BIOPSY Left   . CARDIAC CATHETERIZATION N/A 02/24/2016   Procedure: Left Heart Cath and Coronary Angiography;  Surgeon: Belva Crome, MD;  Location: [facility];  Service: Cardiovascular;  Laterality: N/A;  . CATARACT EXTRACTION Bilateral   . CORONARY STENT PLACEMENT    . EYE SURGERY    . LUNG REMOVAL, PARTIAL Right 2008  . PARTIAL HYSTERECTOMY  1986   irregular periods, ovaries remain    Current Medications: Current Meds  Medication Sig  . albuterol (PROAIR HFA) 108 (90 Base) MCG/ACT inhaler Inhale 2 puffs into the lungs every 4 (four) hours as needed for wheezing or shortness of breath.  Marland Kitchen aspirin EC 81 MG tablet Take 1 tablet (81 mg total) by mouth daily.  . budesonide (PULMICORT) 0.5 MG/2ML nebulizer solution Take 2 mLs (0.5 mg total) by nebulization 2 (two) times daily.  . Calcium Carbonate-Vitamin D (CALCIUM-VITAMIN D) 500-200 MG-UNIT per tablet Take 1 tablet by mouth 2 (two) times daily.   . Cholecalciferol (VITAMIN D) 2000 UNITS CAPS Take 1 capsule by mouth daily.  . clopidogrel (PLAVIX) 75 MG tablet Take 1 tablet (75 mg total) by mouth daily.  Marland Kitchen denosumab (PROLIA) 60 MG/ML SOLN injection Inject 60 mg into the skin every 6 (six) months. Administer in upper arm, thigh, or abdomen  . furosemide (LASIX) 20 MG tablet Take 1 tablet (20 mg total) by mouth daily.  Marland Kitchen ipratropium-albuterol (DUONEB) 0.5-2.5 (3) MG/3ML SOLN Take 3 mLs by nebulization 2 (two) times daily.  . isosorbide mononitrate (IMDUR) 30 MG 24 hr tablet TAKE 1 TABLET BY MOUTH DAILY  . LOTEMAX 0.5 % ophthalmic suspension Place 1 drop into both eyes daily.   . nitroGLYCERIN (NITROSTAT) 0.4 MG SL tablet Place 1 tablet (0.4 mg total) under the tongue every 5 (five) minutes as needed for chest pain.  . pantoprazole (PROTONIX) 40 MG tablet Take 1 tablet (40 mg total) by mouth daily.  . simvastatin (ZOCOR) 40 MG tablet TAKE ONE TABLET BY MOUTH EVERY NIGHT AT BEDTIME  . spironolactone (ALDACTONE)  25 MG tablet TAKE 1/2 TABLET BY MOUTH ONCE DAILY     Allergies:   Actonel [risedronate sodium]; Amlodipine; Fosamax [alendronate sodium]; Lisinopril; Pravastatin; and Codeine   Social History   Social History  . Marital status: Widowed    Spouse name: N/A  . Number of children: 1  . Years of education: N/A   Occupational History  . retired     Insurance underwriter   Social History Main Topics  . Smoking status: Former Smoker    Types: Cigarettes  . Smokeless tobacco: Never Used  . Alcohol use No  . Drug use: No  . Sexual activity: No   Other Topics Concern  . None   Social History Narrative   The patient is a widow and has one son and one dog.     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  She does not drink alcohol. 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   Ed: HS   Activity: no regular exercise   Diet: some water, some fruits/vegetables    Lives in a one story home.      Family History:  The patient's family history includes Breast cancer in her sister; CAD (age of onset: 78) in her father and mother; Diabetes in her sister; Heart attack in her mother; Hypertension in her mother; Stroke in her father and mother.   ROS:   Please see the history of present illness.    Review of Systems  Respiratory: Positive for cough.    All other systems reviewed and are negative.   EKGs/Labs/Other Test Reviewed:    EKG:  EKG is not ordered today.  The ekg ordered today demonstrates n/a  Recent Labs: 02/26/2016: Magnesium 2.0 04/28/2016: ALT 14; Hemoglobin 14.3; Platelets 304.0; TSH 1.54 06/22/2016: Brain Natriuretic Peptide 368.4; BUN 15; Creat 0.81; Potassium 4.2; Sodium 139   Recent Lipid Panel    Component Value Date/Time   CHOL 99 02/24/2016 0356   TRIG 48 02/24/2016 0356   HDL 42 02/24/2016 0356   CHOLHDL 2.4 02/24/2016 0356   VLDL 10 02/24/2016 0356   LDLCALC 47 02/24/2016 0356     Physical Exam:    VS:  BP  140/60   Pulse 64   Ht '5\' 6"'$  (1.676 m)   Wt 118 lb 12.8 oz (53.9 kg)   BMI 19.17 kg/m     Wt Readings from Last 3 Encounters:  06/22/16 118 lb 12.8 oz (53.9 kg)  06/06/16 117 lb (53.1 kg)  05/08/16 117 lb (53.1 kg)     Physical Exam  Constitutional: She is oriented to person, place, and time. She appears well-developed and well-nourished. No distress.  HENT:  Head: Normocephalic and atraumatic.  Eyes: No scleral icterus.  Neck: No JVD present.  Cardiovascular: Normal rate, regular rhythm and normal heart sounds.   No murmur heard. Pulmonary/Chest: Effort normal. She has no wheezes. She has no rales.  Abdominal: Soft. There is no tenderness.  Musculoskeletal: She exhibits no edema.  Neurological: She is alert and oriented to person, place, and time.  Skin: Skin is warm and dry.  Psychiatric: She has a normal mood and affect.    ASSESSMENT:    1. Atherosclerosis of native coronary artery of native heart with angina pectoris ([place])   2. Chronic combined systolic and diastolic heart failure ([place])   3. Essential hypertension   4. Pure hypercholesterolemia   5. Palpitations   6. Panlobular emphysema ([place])    PLAN:    In order of problems listed above:  1. CAD - s/p BMS to LCx in 2010 in the setting of acute myocardial infarction.  She had a subsequent anterior STEMI in 7/17 with occlusion of the apical LAD noted on LHC that was treated medically.  She has residual pLAD 65% and oD1 65% stenosis.  The LCx stent was patent.  She is doing well without recurrent angina.  Continue current Rx with ASA, Plavix, nitrates, statin.  2. Chronic combined systolic and diastolic CHF - She has an ischemic CM.  EF 40-45 with grade 2 diastolic dysfunction.  Her volume appears stable on current dose of Lasix and Spironolactone.  She has been intol of beta-blocker and ACE inhibitor in the past due to lung disease.  She is tolerating nitrates well.  We discussed adding low dose Hydralazine (10 mg  bid) to her regimen.  But, she prefers to hold off on  this for now.  As she does have episodes of what sounds like orthopnea and PND, I will obtain a BMET, BNP today.  If her BNP is significantly higher, I will increase her lasix.    3. HTN - BP is borderline today.  She notes more optimal BPs at home.  Continue to monitor.    4. HL - LDL in 7/17 was 47.  Continue statin.  5. Palpitations - She did note a rapid heart beat this AM. This was brief.  If she has a recurrence, would consider an Event Monitor.  6. Emphysema - I suspect her COPD/emphysema is playing a larger role in her dyspnea and night time symptoms than her CAD and CHF.  If her BNP is normal or not significantly elevated, I would recommend close follow up with her Pulmonologist.  I did suggest that she try increasing her nebulizers to Three times a day to see if this helps any of her symptoms.     Medication Adjustments/Labs and Tests Ordered: Current medicines are reviewed at length with the patient today.  Concerns regarding medicines are outlined above.  Medication changes, Labs and Tests ordered today are outlined in the Patient Instructions noted below. Patient Instructions  Medication Instructions:  Your physician recommends that you continue on your current medications as directed. Please refer to the Current Medication list given to you today.  Labwork: NONE  Testing/Procedures: NONE  Follow-Up: 1. DR. Burt Knack 09/2016; OUR OFFICE WILL SEND OUT A LETTER A COUPLE OF MONTHS EARLY TO HAVE YOU CALL AND MAKE APPT.   Any Other Special Instructions Will Be Listed Below (If Applicable). 1. CALL IF YOUR PALPITATIONS CONTINUE 2. OK TO INCREASE YOUR NEBULIZER TO 3 TIMES A DAY; IF THIS HELPS; PLEASE CALL DR. Brock Ra OFFICE FOR REFILLS   If you need a refill on your cardiac medications before your next appointment, please call your pharmacy.  Signed, Richardson Dopp, PA-C  06/22/2016 5:06 PM    [facility]  [facility], [place], [place]  [address] Phone: [phone]; Fax: [phone]

## 2016-06-23 ENCOUNTER — Other Ambulatory Visit: Payer: Self-pay

## 2016-06-23 DIAGNOSIS — I5042 Chronic combined systolic (congestive) and diastolic (congestive) heart failure: Secondary | ICD-10-CM

## 2016-06-25 ENCOUNTER — Telehealth: Payer: Self-pay | Admitting: Cardiovascular Disease

## 2016-06-25 NOTE — Telephone Encounter (Signed)
Pt's niece helps care for a her and told her her Lasix's was doubled at last visit, but she doesn't remember why? Niece calling to verfiy why is was doubled-pls call

## 2016-06-25 NOTE — Telephone Encounter (Signed)
I spoke with Almyra Free per DPR on file.  I made her aware of pt lab results, medication instructions and need for repeat lab work.

## 2016-06-26 HISTORY — PX: CORONARY STENT PLACEMENT: SHX1402

## 2016-06-29 ENCOUNTER — Telehealth: Payer: Self-pay | Admitting: Family Medicine

## 2016-06-29 ENCOUNTER — Ambulatory Visit (INDEPENDENT_AMBULATORY_CARE_PROVIDER_SITE_OTHER)
Admission: RE | Admit: 2016-06-29 | Discharge: 2016-06-29 | Disposition: A | Discharge status: Home / Self Care | Payer: PPO | Source: Ambulatory Visit | Attending: Family Medicine | Admitting: Family Medicine

## 2016-06-29 ENCOUNTER — Ambulatory Visit (INDEPENDENT_AMBULATORY_CARE_PROVIDER_SITE_OTHER): Payer: PPO | Admitting: Family Medicine

## 2016-06-29 ENCOUNTER — Encounter: Payer: Self-pay | Admitting: Family Medicine

## 2016-06-29 VITALS — BP 124/68 | HR 63 | Temp 97.4°F | Ht 66.0 in | Wt 114.5 lb

## 2016-06-29 DIAGNOSIS — R05 Cough: Secondary | ICD-10-CM

## 2016-06-29 DIAGNOSIS — R058 Other specified cough: Secondary | ICD-10-CM

## 2016-06-29 DIAGNOSIS — J441 Chronic obstructive pulmonary disease with (acute) exacerbation: Secondary | ICD-10-CM | POA: Diagnosis not present

## 2016-06-29 DIAGNOSIS — I5042 Chronic combined systolic (congestive) and diastolic (congestive) heart failure: Secondary | ICD-10-CM

## 2016-06-29 LAB — BASIC METABOLIC PANEL
BUN: 13 mg/dL (ref 7–25)
CALCIUM: 8.9 mg/dL (ref 8.6–10.4)
CHLORIDE: 99 mmol/L (ref 98–110)
CO2: 28 mmol/L (ref 20–31)
CREATININE: 0.75 mg/dL (ref 0.60–0.93)
Glucose, Bld: 94 mg/dL (ref 65–99)
Potassium: 4.2 mmol/L (ref 3.5–5.3)
SODIUM: 135 mmol/L (ref 135–146)

## 2016-06-29 MED ORDER — AZITHROMYCIN 250 MG PO TABS: ORAL_TABLET | ORAL | 0 refills | Status: DC

## 2016-06-29 NOTE — Telephone Encounter (Signed)
Pt notified of Dr. Marliss Coots comments/instructions on xray and pt verbalized understanding. Pt will get Rx and keep [place] updated. Rx sent to [facility] per pt request an Rx cancelled at [facility]

## 2016-06-29 NOTE — Assessment & Plan Note (Addendum)
In pt with hx of copd and lung resection Worse over the past wk Relatively clear exam - cxr ordered and pending rad review Low threshold to treat given hx of copd and pneumonia

## 2016-06-29 NOTE — Assessment & Plan Note (Signed)
Pt has pulmicort bid and duoneb or albuterol prn  She feels better than yesterday and her lung exam is relatively clear  Pending cxr result

## 2016-06-29 NOTE — Telephone Encounter (Signed)
Please let her know that her cxr showed some opacities (dense areas in R mid /lower lung) - this could be from infection or these could be the same areas noted on her recent CT scan   I am going to empirically cover her with zithromax just in case   Will also cc to her pulmonologist (she has f/u in Jan) as well as her pcp  Please alert me if symptoms worsen (esp reactive airways)  Or do not improve   I sent the abx to her pharmacy

## 2016-06-29 NOTE — Patient Instructions (Addendum)
I think you have an upper respiratory infection - but I want to get a chest xray today to make sure there is no early pneumonia  Lungs sound pretty clear today  You can try over the counter mucinex as needed to loosen congestion  Also make sure to drink more fluids/ water  We will contact you with results   Labs today for cardiology as well   Update if not starting to improve in a week or if worsening

## 2016-06-29 NOTE — Progress Notes (Signed)
Pre visit review using our clinic review tool, if applicable. No additional management support is needed unless otherwise documented below in the visit note. 

## 2016-06-29 NOTE — Telephone Encounter (Signed)
Patient Name: JOLYNE Alan DOB: 1940/12/06 Initial Comment aunt is having shortness of breath, soreness in chest and stomach to the touch, congestion, weak Nurse Assessment Nurse: Ronnald Ramp, RN, Miranda Date/Time (Eastern Time): 06/29/2016 8:16:07 AM Confirm and document reason for call. If symptomatic, describe symptoms. ---Caller states her aunt is having cough and SOB. Also with chest congestion all weekend. Today she is c/o tenderness in her chest and stomach. Does the patient have any new or worsening symptoms? ---Yes Will a triage be completed? ---Yes Related visit to physician within the last 2 weeks? ---No Does the PT have any chronic conditions? (i.e. diabetes, asthma, etc.) ---Yes List chronic conditions. ---COPD, CHF Is this a behavioral health or substance abuse call? ---No Guidelines Guideline Title Affirmed Question Affirmed Notes Cough - Acute Productive SEVERE coughing spells (e.g., whooping sound after coughing, vomiting after coughing) Final Disposition User See Physician within 24 Hours Ronnald Ramp, RN, Miranda Comments No appt avaiable with PCP, Appt scheduled for 1015 with Dr. Glori Bickers. Referrals REFERRED TO PCP OFFICE Disagree/Comply: Comply

## 2016-06-29 NOTE — Progress Notes (Signed)
Subjective:    Patient ID: [name], female    DOB: 19-Jan-1941, 75 y.o.   MRN: [id number]  HPI 75 yo pt of Dr Darnell Level, with hx of copd and lung resection in the past here for uri symptoms   Started on Sat felt malaise and weak in general- stayed in bed  A little better on Sunday (but coughing worse) - had a cough over the past week   Prod cough- brown in color  A little wheezing No fever   Some pnd / scratchy throat No facial pain or ear pain   Tends to get pneumonia easily  Is up to date with her imms   Sees Dr Lamonte Sakai for pulmonary Lung resection  Copd in the past  Non smoker   She uses nebulizer - albuterol  (had to use it over the weekend more than usual) , also has duo neb Also pulmicort - twice daily    Patient Active Problem List   Diagnosis Date Noted  . Productive cough 06/29/2016  . Symptomatic bradycardia 04/01/2016  . Malnutrition of moderate degree 03/20/2016  . COPD (chronic obstructive pulmonary disease) ([place]) 03/19/2016  . Lumbar pain with radiation down left leg 03/02/2016  . Underweight 10/07/2015  . Vertigo 04/22/2015  . Skin nodule 04/22/2015  . Medicare annual wellness visit, subsequent 04/08/2015  . Advanced care planning/counseling discussion 04/08/2015  . Vitamin D insufficiency 02/02/2015  . CKD (chronic kidney disease) stage 3, GFR 30-59 ml/min   . History of CVA (cerebrovascular accident) without residual deficits   . Prediabetes   . Ischemic cardiomyopathy 02/27/2014  . Osteoporosis 09/24/2013  . COPD exacerbation ([place]) 10/26/2012  . Pulmonary nodule, left 10/26/2012  . TIA (transient ischemic attack) 07/08/2012  . Cancer of lower lobe of right lung ([place]) 04/21/2012  . Chronic combined systolic and diastolic heart failure ([place]) 06/17/2009  . HLD (hyperlipidemia) 04/24/2009  . CAD, NATIVE VESSEL 04/24/2009  . GERD 04/24/2009  . Macular degeneration (senile) of retina 04/23/2009  . Essential hypertension 04/23/2009  . Osteoarthritis  04/23/2009   Past Medical History:  Diagnosis Date  . Arthritis   . CAD (coronary artery disease) 03/2009   s/p MI  . Chronic combined systolic and diastolic heart failure ([place]) 06/17/2009   Qualifier: Diagnosis of  By: Burt Knack, MD, Clayburn Pert   . CKD (chronic kidney disease) stage 3, GFR 30-59 ml/min   . Emphysema/COPD ([place])   . Ex-smoker quit 2008  . GERD (gastroesophageal reflux disease)   . HCAP (healthcare-associated pneumonia) 03/19/2016  . Helicobacter pylori gastritis 10/2007   treated  . History of CVA (cerebrovascular accident) without residual deficits 01/2012, 06/2012   R hemorrhagic MCA 01/2012 with remote lacunar infarct L putamen and IC, rpt 06/2012 acute multifocal R MCA infarct with remote hemorrhagic strokes affecting L basal ganglia and periventricular white matter, full recovery  . HLD (hyperlipidemia)   . HTN (hypertension)   . Ischemic cardiomyopathy 02/27/2014  . Lung cancer ([place]) dx'd 07/2006   Lung CA, s/p resection, followed by Dr Earlie Server  . Macular degeneration   . Myocardial infarction 03/2009   Acute myocardial infarction 2010 - treated with BMS of LCx. LVEF 50% with subsequent CHF  . Osteoporosis 09/2013   T -3.6 forearm 09/2013, T -4.5 forearm 04/2015  . Stroke [facility])    Past Surgical History:  Procedure Laterality Date  . BREAST BIOPSY Left   . CARDIAC CATHETERIZATION N/A 02/24/2016   Procedure: Left Heart Cath and Coronary Angiography;  Surgeon:  Belva Crome, MD;  Location: [place] [facility];  Service: Cardiovascular;  Laterality: N/A;  . CATARACT EXTRACTION Bilateral   . CORONARY STENT PLACEMENT    . EYE SURGERY    . LUNG REMOVAL, PARTIAL Right 2008  . PARTIAL HYSTERECTOMY  1986   irregular periods, ovaries remain   Social History  Substance Use Topics  . Smoking status: Former Smoker    Types: Cigarettes  . Smokeless tobacco: Never Used  . Alcohol use No   Family History  Problem Relation Age of Onset  . CAD Mother 28    MI  . Stroke  Mother   . Hypertension Mother   . Heart attack Mother   . CAD Father 62  . Stroke Father   . Breast cancer Sister   . Diabetes Sister    Allergies  Allergen Reactions  . Actonel [Risedronate Sodium] Other (See Comments)    Headache  . Amlodipine Other (See Comments)    Pedal edema  . Fosamax [Alendronate Sodium] Other (See Comments)    Unable to tolerate  . Lisinopril Cough  . Pravastatin Other (See Comments)    Constipation.  . Codeine Rash   Current Outpatient Prescriptions on File Prior to Visit  Medication Sig Dispense Refill  . albuterol (PROAIR HFA) 108 (90 Base) MCG/ACT inhaler Inhale 2 puffs into the lungs every 4 (four) hours as needed for wheezing or shortness of breath. 1 Inhaler 1  . aspirin EC 81 MG tablet Take 1 tablet (81 mg total) by mouth daily.    . budesonide (PULMICORT) 0.5 MG/2ML nebulizer solution Take 2 mLs (0.5 mg total) by nebulization 2 (two) times daily. 120 mL 5  . Calcium Carbonate-Vitamin D (CALCIUM-VITAMIN D) 500-200 MG-UNIT per tablet Take 1 tablet by mouth 2 (two) times daily.     . Cholecalciferol (VITAMIN D) 2000 UNITS CAPS Take 1 capsule by mouth daily.    . clopidogrel (PLAVIX) 75 MG tablet Take 1 tablet (75 mg total) by mouth daily. 30 tablet 12  . denosumab (PROLIA) 60 MG/ML SOLN injection Inject 60 mg into the skin every 6 (six) months. Administer in upper arm, thigh, or abdomen 60 mL 1  . furosemide (LASIX) 20 MG tablet Take 2 tablets by mouth daily on Monday, Wednesday and Friday and one tablet by mouth daily all other days 90 tablet 3  . ipratropium-albuterol (DUONEB) 0.5-2.5 (3) MG/3ML SOLN Take 3 mLs by nebulization 2 (two) times daily.    . isosorbide mononitrate (IMDUR) 30 MG 24 hr tablet TAKE 1 TABLET BY MOUTH DAILY 30 tablet 10  . LOTEMAX 0.5 % ophthalmic suspension Place 1 drop into both eyes daily.     . nitroGLYCERIN (NITROSTAT) 0.4 MG SL tablet Place 1 tablet (0.4 mg total) under the tongue every 5 (five) minutes as needed for  chest pain. 25 tablet 3  . pantoprazole (PROTONIX) 40 MG tablet Take 1 tablet (40 mg total) by mouth daily. 30 tablet 11  . simvastatin (ZOCOR) 40 MG tablet TAKE ONE TABLET BY MOUTH EVERY NIGHT AT BEDTIME 30 tablet 6  . spironolactone (ALDACTONE) 25 MG tablet TAKE 1/2 TABLET BY MOUTH ONCE DAILY 30 tablet 11   No current facility-administered medications on file prior to visit.     Review of Systems Review of Systems  Constitutional: Negative for fever, appetite change,  and unexpected weight change.  Eyes: Negative for pain and visual disturbance.  ENT pos for pnd / mild rhinorrhea/ neg for facial pain  Respiratory:  pos for cough and shortness of breath.  (she does have baseline sob) , pos for chest wall soreness from cough  Cardiovascular: Negative for cp or palpitations    Gastrointestinal: Negative for nausea, diarrhea and constipation.  Genitourinary: Negative for urgency and frequency.  Skin: Negative for pallor or rash   Neurological: Negative for weakness, light-headedness, numbness and headaches.  Hematological: Negative for adenopathy. Does not bruise/bleed easily.  Psychiatric/Behavioral: Negative for dysphoric mood. The patient is not nervous/anxious.         Objective:   Physical Exam  Constitutional: She appears well-developed and well-nourished. No distress.  Underweight chronically ill appearing female in good spirits   HENT:  Head: Normocephalic and atraumatic.  Right Ear: External ear normal.  Left Ear: External ear normal.  Mouth/Throat: Oropharynx is clear and moist. No oropharyngeal exudate.  Nares are boggy and mildly congested pnd -clear in throat  No sinus tenderness  TMs clear  Eyes: Conjunctivae and EOM are normal. Pupils are equal, round, and reactive to light. Right eye exhibits no discharge. Left eye exhibits no discharge.  Neck: Normal range of motion. Neck supple.  Cardiovascular: Normal rate and regular rhythm.   Pulmonary/Chest: Effort normal and  breath sounds normal. No respiratory distress. She has no wheezes. She has no rales. She exhibits tenderness.  Mild lower anterior cw tenderness  Diffusely distant bs Fair air exch No prolonged exp phase Dec bs R lung No rales or rhonchi Scant wheeze only on forced exp  Some upper airway sounds are resolved by cough  Lymphadenopathy:    She has no cervical adenopathy.  Neurological: She is alert. No cranial nerve deficit.  Skin: Skin is warm and dry. No rash noted. No pallor.  Psychiatric: She has a normal mood and affect.          Assessment & Plan:   Problem List Items Addressed This Visit      Cardiovascular and Mediastinum   Chronic combined systolic and diastolic heart failure (HCC)     Respiratory   COPD exacerbation ([place]) - Primary    Pt has pulmicort bid and duoneb or albuterol prn  She feels better than yesterday and her lung exam is relatively clear  Pending cxr result         Other   Productive cough    In pt with hx of copd and lung resection Worse over the past wk Relatively clear exam - cxr ordered and pending rad review Low threshold to treat given hx of copd and pneumonia       Relevant Orders   DG Chest 2 View (Completed)

## 2016-06-29 NOTE — Assessment & Plan Note (Signed)
Cardiology labs drawn here while pt is present at her request

## 2016-06-30 ENCOUNTER — Other Ambulatory Visit: Payer: PPO

## 2016-07-01 ENCOUNTER — Telehealth: Payer: Self-pay | Admitting: Internal Medicine

## 2016-07-01 ENCOUNTER — Telehealth: Payer: Self-pay | Admitting: Cardiovascular Disease

## 2016-07-01 NOTE — Telephone Encounter (Signed)
New message      Calling to get lab results.  Labs were drawn at [place].  Ok to call tomorrow

## 2016-07-01 NOTE — Telephone Encounter (Addendum)
error 

## 2016-07-01 NOTE — Telephone Encounter (Signed)
Patient's niece (DPR on file) called about patient's lab results (BMET) from 06/29/16. Results were normal. Niece wanted to know if patient should continue to take two '20mg'$  tablets of Furosemide on Monday, Wednesday and Friday and one '20mg'$  tablet all other days. Please advise.

## 2016-07-02 ENCOUNTER — Telehealth: Payer: Self-pay | Admitting: Emergency Medicine

## 2016-07-02 NOTE — Telephone Encounter (Signed)
Spoke with pt's niece, Almyra Free. States that pt was diagnosed with PNA on Monday by her PCP. She was given a Zpack. Pt feels like she is not improving, she is coughing up yellow/brown mucus. I advised pt's niece that pt would need an appointment as we have not seen her since 11/2015. Almyra Free states that she will speak to the pt and see if she would be up to coming for an appointment. Will await Julie's call back.

## 2016-07-02 NOTE — Telephone Encounter (Signed)
Patient's niece (DPR on file) and informed her that patient should continue on current dose of Lasix (40 mg Mon, Wed, Fri) and 20 mg all other days. Niece verbalized understanding and appreciated the call.

## 2016-07-02 NOTE — Telephone Encounter (Signed)
BMET from 12/4 (not sure why it did not come to me) with normal creatinine and potassium. She should continue on current dose of Lasix (40 mg Mon, Wed, Fri) and 20 mg all other days. Richardson Dopp, PA-C   07/02/2016 4:44 PM

## 2016-07-03 ENCOUNTER — Telehealth: Payer: Self-pay

## 2016-07-03 MED ORDER — AZITHROMYCIN 250 MG PO TABS: ORAL_TABLET | ORAL | 0 refills | Status: DC

## 2016-07-03 NOTE — Telephone Encounter (Signed)
Lm on julie's vm and requested a call back

## 2016-07-03 NOTE — Telephone Encounter (Signed)
Spoke to [name] who states she is mildly improving but is still having congestion with green mucus. She has used her rescue inhaler twice today already. Zpak resent to pharmacy

## 2016-07-03 NOTE — Telephone Encounter (Signed)
[name] pts niece (DPR signed) left v/m; pt was seen 06/29/16 with pneumonia; pt has finished abx and still coughing up yellowish brown phlegm. [name] request cb with what else to do. [facility].

## 2016-07-03 NOTE — Addendum Note (Signed)
Addended by: Modena Nunnery on: 07/03/2016 01:33 PM   Modules accepted: Orders

## 2016-07-03 NOTE — Telephone Encounter (Signed)
Repeat the zpak and update if no further improvement  Follow up if worse

## 2016-07-03 NOTE — Telephone Encounter (Signed)
Please ask if her symptoms are worse or improving at all?   Please send in another zpak for her  Ask how her breathing is, thanks

## 2016-07-07 NOTE — Telephone Encounter (Signed)
Almyra Free states that the patient was given another round of ZPak by PCP - will complete tomorrow.  Pt still having brown/yellow mucus when she coughs and is still having to use her rescue inhaler frequently and nebulizer. Requests an appointment with out office. Pt seems to be feeling better but her chest is not clearing.  Almyra Free requests that we call the patient to see if she wants to be seen today or tomorrow.   Spoke with patient, scheduled for appt 07/08/16 at 3:00pm with RB  Nothing further needed.

## 2016-07-07 NOTE — Telephone Encounter (Signed)
lmtcb for [name]. 

## 2016-07-07 NOTE — Telephone Encounter (Signed)
[place] calling back

## 2016-07-08 ENCOUNTER — Ambulatory Visit: Payer: PPO | Admitting: Emergency Medicine

## 2016-07-08 ENCOUNTER — Observation Stay (HOSPITAL_BASED_OUTPATIENT_CLINIC_OR_DEPARTMENT_OTHER): Payer: PPO

## 2016-07-08 ENCOUNTER — Encounter (HOSPITAL_COMMUNITY): Payer: Self-pay | Admitting: Emergency Medicine

## 2016-07-08 ENCOUNTER — Emergency Department (HOSPITAL_COMMUNITY): Payer: PPO

## 2016-07-08 ENCOUNTER — Inpatient Hospital Stay (HOSPITAL_COMMUNITY)
Admission: EM | Admit: 2016-07-08 | Discharge: 2016-07-10 | DRG: 246 | Disposition: A | Discharge status: Home / Self Care | Payer: PPO | Source: Ambulatory Visit | Attending: Cardiovascular Disease | Admitting: Cardiovascular Disease

## 2016-07-08 ENCOUNTER — Encounter (HOSPITAL_COMMUNITY)
Admission: EM | Disposition: A | Discharge status: Home / Self Care | Payer: Self-pay | Source: Ambulatory Visit | Attending: Cardiovascular Disease

## 2016-07-08 DIAGNOSIS — R079 Chest pain, unspecified: Secondary | ICD-10-CM | POA: Diagnosis not present

## 2016-07-08 DIAGNOSIS — Z8673 Personal history of transient ischemic attack (TIA), and cerebral infarction without residual deficits: Secondary | ICD-10-CM

## 2016-07-08 DIAGNOSIS — I255 Ischemic cardiomyopathy: Secondary | ICD-10-CM | POA: Diagnosis not present

## 2016-07-08 DIAGNOSIS — Z7982 Long term (current) use of aspirin: Secondary | ICD-10-CM

## 2016-07-08 DIAGNOSIS — H3530 Unspecified macular degeneration: Secondary | ICD-10-CM | POA: Diagnosis present

## 2016-07-08 DIAGNOSIS — I083 Combined rheumatic disorders of mitral, aortic and tricuspid valves: Secondary | ICD-10-CM | POA: Diagnosis not present

## 2016-07-08 DIAGNOSIS — I130 Hypertensive heart and chronic kidney disease with heart failure and stage 1 through stage 4 chronic kidney disease, or unspecified chronic kidney disease: Secondary | ICD-10-CM | POA: Diagnosis not present

## 2016-07-08 DIAGNOSIS — I2582 Chronic total occlusion of coronary artery: Secondary | ICD-10-CM | POA: Diagnosis present

## 2016-07-08 DIAGNOSIS — E559 Vitamin D deficiency, unspecified: Secondary | ICD-10-CM | POA: Diagnosis not present

## 2016-07-08 DIAGNOSIS — K219 Gastro-esophageal reflux disease without esophagitis: Secondary | ICD-10-CM | POA: Diagnosis not present

## 2016-07-08 DIAGNOSIS — Z885 Allergy status to narcotic agent status: Secondary | ICD-10-CM | POA: Diagnosis not present

## 2016-07-08 DIAGNOSIS — I214 Non-ST elevation (NSTEMI) myocardial infarction: Secondary | ICD-10-CM | POA: Diagnosis not present

## 2016-07-08 DIAGNOSIS — I5042 Chronic combined systolic (congestive) and diastolic (congestive) heart failure: Secondary | ICD-10-CM | POA: Diagnosis present

## 2016-07-08 DIAGNOSIS — Z79899 Other long term (current) drug therapy: Secondary | ICD-10-CM

## 2016-07-08 DIAGNOSIS — I110 Hypertensive heart disease with heart failure: Secondary | ICD-10-CM | POA: Diagnosis not present

## 2016-07-08 DIAGNOSIS — Z7902 Long term (current) use of antithrombotics/antiplatelets: Secondary | ICD-10-CM

## 2016-07-08 DIAGNOSIS — Z66 Do not resuscitate: Secondary | ICD-10-CM | POA: Diagnosis not present

## 2016-07-08 DIAGNOSIS — I2510 Atherosclerotic heart disease of native coronary artery without angina pectoris: Secondary | ICD-10-CM | POA: Diagnosis not present

## 2016-07-08 DIAGNOSIS — J189 Pneumonia, unspecified organism: Secondary | ICD-10-CM | POA: Diagnosis present

## 2016-07-08 DIAGNOSIS — Z85118 Personal history of other malignant neoplasm of bronchus and lung: Secondary | ICD-10-CM

## 2016-07-08 DIAGNOSIS — Z87891 Personal history of nicotine dependence: Secondary | ICD-10-CM

## 2016-07-08 DIAGNOSIS — I5043 Acute on chronic combined systolic (congestive) and diastolic (congestive) heart failure: Secondary | ICD-10-CM | POA: Diagnosis present

## 2016-07-08 DIAGNOSIS — N183 Chronic kidney disease, stage 3 (moderate): Secondary | ICD-10-CM | POA: Diagnosis not present

## 2016-07-08 DIAGNOSIS — Z8249 Family history of ischemic heart disease and other diseases of the circulatory system: Secondary | ICD-10-CM | POA: Diagnosis not present

## 2016-07-08 DIAGNOSIS — J440 Chronic obstructive pulmonary disease with acute lower respiratory infection: Secondary | ICD-10-CM | POA: Diagnosis not present

## 2016-07-08 DIAGNOSIS — I5023 Acute on chronic systolic (congestive) heart failure: Secondary | ICD-10-CM | POA: Diagnosis not present

## 2016-07-08 DIAGNOSIS — E785 Hyperlipidemia, unspecified: Secondary | ICD-10-CM | POA: Diagnosis not present

## 2016-07-08 DIAGNOSIS — M810 Age-related osteoporosis without current pathological fracture: Secondary | ICD-10-CM | POA: Diagnosis not present

## 2016-07-08 DIAGNOSIS — I249 Acute ischemic heart disease, unspecified: Secondary | ICD-10-CM | POA: Diagnosis not present

## 2016-07-08 DIAGNOSIS — I129 Hypertensive chronic kidney disease with stage 1 through stage 4 chronic kidney disease, or unspecified chronic kidney disease: Secondary | ICD-10-CM | POA: Diagnosis not present

## 2016-07-08 DIAGNOSIS — Z888 Allergy status to other drugs, medicaments and biological substances status: Secondary | ICD-10-CM

## 2016-07-08 DIAGNOSIS — Z955 Presence of coronary angioplasty implant and graft: Secondary | ICD-10-CM

## 2016-07-08 LAB — APTT: aPTT: 31 s (ref 24–36)

## 2016-07-08 LAB — CBC
HCT: 41.1 % (ref 36.0–46.0)
HCT: 41.3 % (ref 36.0–46.0)
HEMOGLOBIN: 13.6 g/dL (ref 12.0–15.0)
Hemoglobin: 14.1 g/dL (ref 12.0–15.0)
MCH: 31.5 pg (ref 26.0–34.0)
MCH: 30.8 pg (ref 26.0–34.0)
MCHC: 34.3 g/dL (ref 30.0–36.0)
MCHC: 32.9 g/dL (ref 30.0–36.0)
MCV: 91.9 fL (ref 78.0–100.0)
MCV: 93.4 fL (ref 78.0–100.0)
Platelets: 297 K/uL (ref 150–400)
Platelets: 302 10*3/uL (ref 150–400)
RBC: 4.47 MIL/uL (ref 3.87–5.11)
RBC: 4.42 MIL/uL (ref 3.87–5.11)
RDW: 13.3 % (ref 11.5–15.5)
RDW: 13.5 % (ref 11.5–15.5)
WBC: 8.3 K/uL (ref 4.0–10.5)
WBC: 7.3 10*3/uL (ref 4.0–10.5)

## 2016-07-08 LAB — ECHOCARDIOGRAM COMPLETE
E decel time: 236 msec
FS: 14 % — AB (ref 28–44)
HEIGHTINCHES: 66 in
IVS/LV PW RATIO, ED: 1.2
LA ID, A-P, ES: 38 mm
LA diam end sys: 38 mm
LA vol A4C: 54.1 ml
LA vol index: 33.8 mL/m2
LADIAMINDEX: 2.44 cm/m2
LAVOL: 52.7 mL
LDCA: 3.14 cm2
LVOTD: 20 mm
Lateral S' vel: 14.4 cm/s
MRPISAEROA: 0.08 cm2
MV Dec: 236
MV VTI: 176 cm
MV pk A vel: 68.4 m/s
MV pk E vel: 52.9 m/s
P 1/2 time: 719 ms
PW: 12.3 mm — AB (ref 0.6–1.1)
RV sys press: 39 mmHg
Reg peak vel: 243 cm/s
TR max vel: 243 cm/s
WEIGHTICAEL: 1785.6 [oz_av]

## 2016-07-08 LAB — COMPREHENSIVE METABOLIC PANEL
ALT: 21 U/L (ref 14–54)
AST: 22 U/L (ref 15–41)
Albumin: 3.6 g/dL (ref 3.5–5.0)
Alkaline Phosphatase: 67 U/L (ref 38–126)
Anion gap: 8 (ref 5–15)
BILIRUBIN TOTAL: 0.7 mg/dL (ref 0.3–1.2)
BUN: 12 mg/dL (ref 6–20)
CHLORIDE: 106 mmol/L (ref 101–111)
CO2: 26 mmol/L (ref 22–32)
CREATININE: 0.83 mg/dL (ref 0.44–1.00)
Calcium: 9.9 mg/dL (ref 8.9–10.3)
Glucose, Bld: 106 mg/dL — ABNORMAL HIGH (ref 65–99)
POTASSIUM: 4.4 mmol/L (ref 3.5–5.1)
Sodium: 140 mmol/L (ref 135–145)
TOTAL PROTEIN: 6.7 g/dL (ref 6.5–8.1)

## 2016-07-08 LAB — LIPID PANEL
Cholesterol: 139 mg/dL (ref 0–200)
HDL: 65 mg/dL
LDL Cholesterol: 60 mg/dL (ref 0–99)
Total CHOL/HDL Ratio: 2.1 ratio
Triglycerides: 71 mg/dL
VLDL: 14 mg/dL (ref 0–40)

## 2016-07-08 LAB — I-STAT CHEM 8, ED
BUN: 14 mg/dL (ref 6–20)
Calcium, Ion: 1.21 mmol/L (ref 1.15–1.40)
Chloride: 103 mmol/L (ref 101–111)
Creatinine, Ser: 0.8 mg/dL (ref 0.44–1.00)
Glucose, Bld: 105 mg/dL — ABNORMAL HIGH (ref 65–99)
HCT: 42 % (ref 36.0–46.0)
Hemoglobin: 14.3 g/dL (ref 12.0–15.0)
Potassium: 4.3 mmol/L (ref 3.5–5.1)
Sodium: 140 mmol/L (ref 135–145)
TCO2: 26 mmol/L (ref 0–100)

## 2016-07-08 LAB — PROTIME-INR
INR: 0.98
Prothrombin Time: 13 s (ref 11.4–15.2)

## 2016-07-08 LAB — DIFFERENTIAL
BASOS ABS: 0.1 10*3/uL (ref 0.0–0.1)
BASOS PCT: 1 %
EOS ABS: 0.8 10*3/uL — AB (ref 0.0–0.7)
EOS PCT: 9 %
LYMPHS ABS: 2.1 10*3/uL (ref 0.7–4.0)
Lymphocytes Relative: 25 %
Monocytes Absolute: 0.4 10*3/uL (ref 0.1–1.0)
Monocytes Relative: 5 %
NEUTROS PCT: 60 %
Neutro Abs: 5 10*3/uL (ref 1.7–7.7)

## 2016-07-08 LAB — CREATININE, SERUM
Creatinine, Ser: 0.93 mg/dL (ref 0.44–1.00)
GFR calc Af Amer: >60 mL/min (ref >60)
GFR, EST NON AFRICAN AMERICAN: 59 mL/min — AB (ref >60)

## 2016-07-08 LAB — TROPONIN I
Troponin I: 0.04 ng/mL (ref <0.03)
Troponin I: 0.04 ng/mL (ref <0.03)

## 2016-07-08 LAB — BRAIN NATRIURETIC PEPTIDE: B Natriuretic Peptide: 528.2 pg/mL — ABNORMAL HIGH (ref 0.0–100.0)

## 2016-07-08 SURGERY — LEFT HEART CATH AND CORONARY ANGIOGRAPHY
Anesthesia: LOCAL

## 2016-07-08 MED ORDER — NITROGLYCERIN 0.4 MG SL SUBL: 0.4000 mg | SUBLINGUAL_TABLET | SUBLINGUAL | Status: DC | PRN

## 2016-07-08 MED ORDER — SIMVASTATIN 20 MG PO TABS
40.0000 mg | ORAL_TABLET | Freq: Every day | ORAL | Status: DC
  Administered 2016-07-08 – 2016-07-09 (×2): 40 mg via ORAL
  Filled 2016-07-08: qty 2
  Filled 2016-07-08: qty 1

## 2016-07-08 MED ORDER — ACETAMINOPHEN 325 MG PO TABS: 650.0000 mg | ORAL_TABLET | ORAL | Status: DC | PRN

## 2016-07-08 MED ORDER — ENSURE ENLIVE PO LIQD
237.0000 mL | Freq: Two times a day (BID) | ORAL | Status: DC
  Administered 2016-07-10: 237 mL via ORAL
  Filled 2016-07-08 (×4): qty 237

## 2016-07-08 MED ORDER — ISOSORBIDE MONONITRATE ER 30 MG PO TB24
30.0000 mg | ORAL_TABLET | Freq: Every day | ORAL | Status: DC
  Administered 2016-07-09 – 2016-07-10 (×2): 30 mg via ORAL
  Filled 2016-07-08 (×2): qty 1

## 2016-07-08 MED ORDER — ALBUTEROL SULFATE (2.5 MG/3ML) 0.083% IN NEBU: 2.5000 mg | INHALATION_SOLUTION | RESPIRATORY_TRACT | Status: DC | PRN

## 2016-07-08 MED ORDER — HEPARIN SODIUM (PORCINE) 5000 UNIT/ML IJ SOLN
5000.0000 [IU] | Freq: Three times a day (TID) | INTRAMUSCULAR | Status: DC
  Administered 2016-07-08 – 2016-07-09 (×2): 5000 [IU] via SUBCUTANEOUS
  Filled 2016-07-08 (×2): qty 1

## 2016-07-08 MED ORDER — MORPHINE SULFATE (PF) 4 MG/ML IV SOLN
4.0000 mg | Freq: Once | INTRAVENOUS | Status: AC
  Administered 2016-07-08: 2 mg via INTRAVENOUS
  Filled 2016-07-08: qty 1

## 2016-07-08 MED ORDER — IPRATROPIUM-ALBUTEROL 0.5-2.5 (3) MG/3ML IN SOLN
3.0000 mL | Freq: Two times a day (BID) | RESPIRATORY_TRACT | Status: DC
  Administered 2016-07-08 – 2016-07-10 (×4): 3 mL via RESPIRATORY_TRACT
  Filled 2016-07-08 (×4): qty 3

## 2016-07-08 MED ORDER — PANTOPRAZOLE SODIUM 40 MG PO TBEC
40.0000 mg | DELAYED_RELEASE_TABLET | Freq: Every day | ORAL | Status: DC
  Administered 2016-07-09 – 2016-07-10 (×2): 40 mg via ORAL
  Filled 2016-07-08 (×2): qty 1

## 2016-07-08 MED ORDER — ASPIRIN EC 81 MG PO TBEC
81.0000 mg | DELAYED_RELEASE_TABLET | Freq: Every day | ORAL | Status: DC
  Administered 2016-07-09 – 2016-07-10 (×2): 81 mg via ORAL
  Filled 2016-07-08 (×2): qty 1

## 2016-07-08 MED ORDER — ASPIRIN EC 81 MG PO TBEC: 81.0000 mg | DELAYED_RELEASE_TABLET | Freq: Every day | ORAL | Status: DC

## 2016-07-08 MED ORDER — CLOPIDOGREL BISULFATE 75 MG PO TABS
75.0000 mg | ORAL_TABLET | Freq: Every day | ORAL | Status: DC
  Administered 2016-07-09 – 2016-07-10 (×2): 75 mg via ORAL
  Filled 2016-07-08 (×2): qty 1

## 2016-07-08 MED ORDER — BUDESONIDE 0.5 MG/2ML IN SUSP
0.5000 mg | Freq: Two times a day (BID) | RESPIRATORY_TRACT | Status: DC
  Administered 2016-07-08 – 2016-07-10 (×4): 0.5 mg via RESPIRATORY_TRACT
  Filled 2016-07-08 (×4): qty 2

## 2016-07-08 MED ORDER — PERFLUTREN LIPID MICROSPHERE
1.0000 mL | INTRAVENOUS | Status: AC | PRN
  Administered 2016-07-08: 2 mL via INTRAVENOUS
  Filled 2016-07-08: qty 10

## 2016-07-08 MED ORDER — ONDANSETRON HCL 4 MG/2ML IJ SOLN: 4.0000 mg | Freq: Four times a day (QID) | INTRAMUSCULAR | Status: DC | PRN

## 2016-07-08 MED ORDER — SPIRONOLACTONE 25 MG PO TABS
12.5000 mg | ORAL_TABLET | Freq: Every day | ORAL | Status: DC
  Administered 2016-07-09 – 2016-07-10 (×2): 12.5 mg via ORAL
  Filled 2016-07-08 (×2): qty 1

## 2016-07-08 MED ORDER — FUROSEMIDE 20 MG PO TABS
20.0000 mg | ORAL_TABLET | Freq: Every day | ORAL | Status: DC
  Administered 2016-07-09 – 2016-07-10 (×2): 20 mg via ORAL
  Filled 2016-07-08 (×2): qty 1

## 2016-07-08 MED ORDER — ALBUTEROL SULFATE (2.5 MG/3ML) 0.083% IN NEBU
2.5000 mg | INHALATION_SOLUTION | Freq: Once | RESPIRATORY_TRACT | Status: AC
  Administered 2016-07-08: 2.5 mg via RESPIRATORY_TRACT
  Filled 2016-07-08: qty 3

## 2016-07-08 MED ORDER — HEPARIN BOLUS VIA INFUSION
4000.0000 [IU] | Freq: Once | INTRAVENOUS | Status: AC
  Administered 2016-07-08: 4000 [IU] via INTRAVENOUS
  Filled 2016-07-08: qty 4000

## 2016-07-08 MED ORDER — NITROGLYCERIN 0.4 MG SL SUBL
0.4000 mg | SUBLINGUAL_TABLET | SUBLINGUAL | Status: DC | PRN
  Administered 2016-07-08: 0.4 mg via SUBLINGUAL
  Filled 2016-07-08: qty 1

## 2016-07-08 MED ORDER — HEPARIN (PORCINE) IN NACL 100-0.45 UNIT/ML-% IJ SOLN
600.0000 [IU]/h | INTRAMUSCULAR | Status: DC
  Administered 2016-07-08 (×2): 600 [IU]/h via INTRAVENOUS
  Filled 2016-07-08: qty 250

## 2016-07-08 NOTE — ED Notes (Signed)
PA with cardiology at bedside with pt. MD cooper to come and see pt as well.

## 2016-07-08 NOTE — Progress Notes (Signed)
Troponin elevated at 0.04. Pt has no chest pain. [facility] PA notified.  Ferdinand Lango, RN

## 2016-07-08 NOTE — Progress Notes (Signed)
  Echocardiogram 2D Echocardiogram with Definity has been performed.  Diamond Nickel 07/08/2016, 3:13 PM

## 2016-07-08 NOTE — ED Triage Notes (Signed)
Pt reports feeling a fluttering in her chest last night and then this am at 5am she had a constant pain in the center of her chest that also caused nausea and pt states she was crying. PT arrived to ed by [facility] as an activated STEMI. Cath lab team met pt in room and repeated ekg. Pt is not having pain on arrival to ED after 1 SL nitro tablet given to pt by ems. Pt also took 4 '81mg'$  asa PTA.

## 2016-07-08 NOTE — ED Provider Notes (Signed)
[facility] DEPT Provider Note   CSN: [id number] Arrival date & time: 07/08/16  0803     History   Chief Complaint Chief Complaint  Patient presents with  . Chest Pain    HPI [name] is a 75 y.o. female.  HPI Pt presents with intermittent left chest and left arm pain since last night. Hx of known CAD. No new cough or SOB. No  Fevers. Given ASA and nitro by EMS. Resolution of pain after second nitroglycerin. Presented as code STEMI activated by EMS    Past Medical History:  Diagnosis Date  . Arthritis   . CAD (coronary artery disease) 03/2009   s/p MI  . 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 CKD (chronic kidney disease) stage 3, GFR 30-59 ml/min   . Emphysema/COPD ([place])   . Ex-smoker quit 2008  . GERD (gastroesophageal reflux disease)   . HCAP (healthcare-associated pneumonia) 03/19/2016  . Helicobacter pylori gastritis 10/2007   treated  . History of CVA (cerebrovascular accident) without residual deficits 01/2012, 06/2012   R hemorrhagic MCA 01/2012 with remote lacunar infarct L putamen and IC, rpt 06/2012 acute multifocal R MCA infarct with remote hemorrhagic strokes affecting L basal ganglia and periventricular white matter, full recovery  . HLD (hyperlipidemia)   . HTN (hypertension)   . Ischemic cardiomyopathy 02/27/2014  . Lung cancer ([place]) dx'd 07/2006   Lung CA, s/p resection, followed by Dr Earlie Server  . Macular degeneration   . Myocardial infarction 03/2009   Acute myocardial infarction 2010 - treated with BMS of LCx. LVEF 50% with subsequent CHF  . Osteoporosis 09/2013   T -3.6 forearm 09/2013, T -4.5 forearm 04/2015  . Stroke [facility])     Patient Active Problem List   Diagnosis Date Noted  . Productive cough 06/29/2016  . Symptomatic bradycardia 04/01/2016  . Malnutrition of moderate degree 03/20/2016  . COPD (chronic obstructive pulmonary disease) ([place])  03/19/2016  . Lumbar pain with radiation down left leg 03/02/2016  . Underweight 10/07/2015  . Vertigo 04/22/2015  . Skin nodule 04/22/2015  . Medicare annual wellness visit, subsequent 04/08/2015  . Advanced care planning/counseling discussion 04/08/2015  . Vitamin D insufficiency 02/02/2015  . CKD (chronic kidney disease) stage 3, GFR 30-59 ml/min   . History of CVA (cerebrovascular accident) without residual deficits   . Prediabetes   . Ischemic cardiomyopathy 02/27/2014  . Osteoporosis 09/24/2013  . COPD exacerbation ([place]) 10/26/2012  . Pulmonary nodule, left 10/26/2012  . TIA (transient ischemic attack) 07/08/2012  . Cancer of lower lobe of right lung ([place]) 04/21/2012  . Chronic combined systolic and diastolic heart failure ([place]) 06/17/2009  . HLD (hyperlipidemia) 04/24/2009  . CAD, NATIVE VESSEL 04/24/2009  . GERD 04/24/2009  . Macular degeneration (senile) of retina 04/23/2009  . Essential hypertension 04/23/2009  . Osteoarthritis 04/23/2009    Past Surgical History:  Procedure Laterality Date  . BREAST BIOPSY Left   . CARDIAC CATHETERIZATION N/A 02/24/2016   Procedure: Left Heart Cath and Coronary Angiography;  Surgeon: Belva Crome, MD;  Location: [facility] CV LAB;  Service: Cardiovascular;  Laterality: N/A;  . CATARACT EXTRACTION Bilateral   . CORONARY STENT PLACEMENT    . EYE SURGERY    . LUNG REMOVAL, PARTIAL Right 2008  . PARTIAL HYSTERECTOMY  1986   irregular periods, ovaries remain    OB History    No data available  Home Medications    Prior to Admission medications   Medication Sig Start Date End Date Taking? Authorizing Provider  albuterol (PROAIR HFA) 108 (90 Base) MCG/ACT inhaler Inhale 2 puffs into the lungs every 4 (four) hours as needed for wheezing or shortness of breath. 03/24/16   Jose Shirl Harris, MD  aspirin EC 81 MG tablet Take 1 tablet (81 mg total) by mouth daily. 02/26/16   Arbutus Leas, NP  azithromycin (ZITHROMAX Z-PAK) 250  MG tablet Take 2 pills by mouth today and then 1 pill daily for 4 days 07/03/16   Abner Greenspan, MD  budesonide (PULMICORT) 0.5 MG/2ML nebulizer solution Take 2 mLs (0.5 mg total) by nebulization 2 (two) times daily. 03/25/16   Collene Gobble, MD  Calcium Carbonate-Vitamin D (CALCIUM-VITAMIN D) 500-200 MG-UNIT per tablet Take 1 tablet by mouth 2 (two) times daily.     Historical Provider, MD  Cholecalciferol (VITAMIN D) 2000 UNITS CAPS Take 1 capsule by mouth daily.    Historical Provider, MD  clopidogrel (PLAVIX) 75 MG tablet Take 1 tablet (75 mg total) by mouth daily. 02/26/16   Arbutus Leas, NP  denosumab (PROLIA) 60 MG/ML SOLN injection Inject 60 mg into the skin every 6 (six) months. Administer in upper arm, thigh, or abdomen 06/13/15   Ria Bush, MD  furosemide (LASIX) 20 MG tablet Take 2 tablets by mouth daily on Monday, Wednesday and Friday and one tablet by mouth daily all other days 06/23/16   Sherren Mocha, MD  ipratropium-albuterol (DUONEB) 0.5-2.5 (3) MG/3ML SOLN Take 3 mLs by nebulization 2 (two) times daily.    Historical Provider, MD  isosorbide mononitrate (IMDUR) 30 MG 24 hr tablet TAKE 1 TABLET BY MOUTH DAILY 05/25/16   Sherren Mocha, MD  LOTEMAX 0.5 % ophthalmic suspension Place 1 drop into both eyes daily.  12/27/13   Historical Provider, MD  nitroGLYCERIN (NITROSTAT) 0.4 MG SL tablet Place 1 tablet (0.4 mg total) under the tongue every 5 (five) minutes as needed for chest pain. 07/03/15   Liliane Shi, PA-C  pantoprazole (PROTONIX) 40 MG tablet Take 1 tablet (40 mg total) by mouth daily. 02/27/16   Sherren Mocha, MD  simvastatin (ZOCOR) 40 MG tablet TAKE ONE TABLET BY MOUTH EVERY NIGHT AT BEDTIME 06/08/16   Sherren Mocha, MD  spironolactone (ALDACTONE) 25 MG tablet TAKE 1/2 TABLET BY MOUTH ONCE DAILY 04/14/16   Sherren Mocha, MD    Family History Family History  Problem Relation Age of Onset  . CAD Mother 85    MI  . Stroke Mother   . Hypertension Mother   . Heart  attack Mother   . CAD Father 72  . Stroke Father   . Breast cancer Sister   . Diabetes Sister     Social History Social History  Substance Use Topics  . Smoking status: Former Smoker    Types: Cigarettes  . Smokeless tobacco: Never Used  . Alcohol use No     Allergies   Actonel [risedronate sodium]; Amlodipine; Fosamax [alendronate sodium]; Lisinopril; Pravastatin; and Codeine   Review of Systems Review of Systems  All other systems reviewed and are negative.    Physical Exam Updated Vital Signs There were no vitals taken for this visit.  Physical Exam  Constitutional: She is oriented to person, place, and time. She appears well-developed and well-nourished. No distress.  HENT:  Head: Normocephalic and atraumatic.  Eyes: EOM are normal.  Neck: Normal range of motion.  Cardiovascular: Normal rate, regular rhythm and normal heart sounds.   Pulmonary/Chest: Effort normal and breath sounds normal.  Abdominal: Soft. She exhibits no distension. There is no tenderness.  Musculoskeletal: Normal range of motion.  Neurological: She is alert and oriented to person, place, and time.  Skin: Skin is warm and dry.  Psychiatric: She has a normal mood and affect. Judgment normal.  Nursing note and vitals reviewed.    ED Treatments / Results  Labs (all labs ordered are listed, but only abnormal results are displayed) Labs Reviewed  DIFFERENTIAL - Abnormal; Notable for the following:       Result Value   Eosinophils Absolute 0.8 (*)    All other components within normal limits  COMPREHENSIVE METABOLIC PANEL - Abnormal; Notable for the following:    Glucose, Bld 106 (*)    All other components within normal limits  I-STAT CHEM 8, ED - Abnormal; Notable for the following:    Glucose, Bld 105 (*)    All other components within normal limits  CBC  PROTIME-INR  APTT  TROPONIN I  LIPID PANEL  HEPARIN LEVEL (UNFRACTIONATED)    EKG  EKG  Interpretation  Date/Time:  Wednesday July 08 2016 08:09:25 EST Ventricular Rate:  76 PR Interval:    QRS Duration: 89 QT Interval:  391 QTC Calculation: 440 R Axis:   91 Text Interpretation:  Sinus rhythm Anterior infarct, old Repol abnrm suggests ischemia, anterolateral nonspecific changes compared to earlier ecg Confirmed by Aleczander Fandino  MD, Lennette Bihari ([id number]) on 07/08/2016 8:22:41 AM       Radiology No results found.  Procedures Procedures (including critical care time)  Medications Ordered in ED Medications  heparin ADULT infusion 100 units/mL (25000 units/261m sodium chloride 0.45%) (600 Units/hr Intravenous New Bag/Given 07/08/16 0909)  nitroGLYCERIN (NITROSTAT) SL tablet 0.4 mg (0.4 mg Sublingual Given 07/08/16 0902)  heparin bolus via infusion 4,000 Units (4,000 Units Intravenous Bolus from Bag 07/08/16 0909)  morphine 4 MG/ML injection 4 mg (2 mg Intravenous Given 07/08/16 0911)     Initial Impression / Assessment and Plan / ED Course  I have reviewed the triage vital signs and the nursing notes.  Pertinent labs & imaging results that were available during my care of the patient were reviewed by me and considered in my medical decision making (see chart for details).  Clinical Course     Seen on arrival by myself and interventional cardiology. Resolution of pain. Will monitor in ER with plan for cath later today. No ST elevation noted on current EKG.  Patient be started on a heparin drip at this time.  We will continue to monitor for recurrent symptoms and pain.  Cardiology to admit with plan for heart catheter later today.  Final Clinical Impressions(s) / ED Diagnoses   Final diagnoses:  Acute coronary syndrome ([facility]    New Prescriptions New Prescriptions   No medications on file     KJola Schmidt MD 07/08/16 [phone]

## 2016-07-08 NOTE — ED Notes (Signed)
Called report to 3W.

## 2016-07-08 NOTE — ED Notes (Signed)
Cardiology at bedside. Repeat ekg done

## 2016-07-08 NOTE — ED Notes (Signed)
Pt states she "feels a little sob of breath" pt is having a NON productive cough that she has been having since her recent pneumonia. MD campos made aware. Verbal order for 2.5 mg albuterol breathing treatment.

## 2016-07-08 NOTE — Progress Notes (Signed)
Code STEMI activated by EMS. Pt with known CAD, Followed by Dr. Burt Knack. Last cath in July 2017 with patent Circumflex stent, moderate LAD disease, patent RCA with minimal disease. EKG transmitted by EMS with poor R wave progression in the precordial leads, LVH, hyperdynamic T waves with ST changes in the anterior leads. Appearance of LVH but different from prior EKG. Upon arrival to ED, pt pain free. EKG with no ST changes. I will ask our consult team to see her this am for admission. She may cardiac cath later today or if symptoms recur, on an urgent basis. CODE STEMI cancelled.   Lauree Chandler 07/08/2016 8:32 AM

## 2016-07-08 NOTE — Progress Notes (Signed)
ANTICOAGULATION CONSULT NOTE - Initial Consult  Pharmacy Consult for heparin Indication: chest pain/ACS  Allergies  Allergen Reactions  . Actonel [Risedronate Sodium] Other (See Comments)    Headache  . Amlodipine Other (See Comments)    Pedal edema  . Fosamax [Alendronate Sodium] Other (See Comments)    Unable to tolerate  . Lisinopril Cough  . Pravastatin Other (See Comments)    Constipation.  . Codeine Rash    Vital Signs:    Labs: No results for input(s): HGB, HCT, PLT, APTT, LABPROT, INR, HEPARINUNFRC, HEPRLOWMOCWT, CREATININE, CKTOTAL, CKMB, TROPONINI in the last 72 hours.  Estimated Creatinine Clearance: 49.8 mL/min (by C-G formula based on SCr of 0.75 mg/dL).   Medical History: Past Medical History:  Diagnosis Date  . Arthritis   . CAD (coronary artery disease) 03/2009   s/p MI  . Chronic combined systolic and diastolic heart failure ([place]) 06/17/2009   Qualifier: Diagnosis of  By: Burt Knack, MD, Clayburn Pert   . CKD (chronic kidney disease) stage 3, GFR 30-59 ml/min   . Emphysema/COPD ([place])   . Ex-smoker quit 2008  . GERD (gastroesophageal reflux disease)   . HCAP (healthcare-associated pneumonia) 03/19/2016  . Helicobacter pylori gastritis 10/2007   treated  . History of CVA (cerebrovascular accident) without residual deficits 01/2012, 06/2012   R hemorrhagic MCA 01/2012 with remote lacunar infarct L putamen and IC, rpt 06/2012 acute multifocal R MCA infarct with remote hemorrhagic strokes affecting L basal ganglia and periventricular white matter, full recovery  . HLD (hyperlipidemia)   . HTN (hypertension)   . Ischemic cardiomyopathy 02/27/2014  . Lung cancer ([place]) dx'd 07/2006   Lung CA, s/p resection, followed by Dr Earlie Server  . Macular degeneration   . Myocardial infarction 03/2009   Acute myocardial infarction 2010 - treated with BMS of LCx. LVEF 50% with subsequent CHF  . Osteoporosis 09/2013   T -3.6 forearm 09/2013, T -4.5 forearm 04/2015  . Stroke [facility])      Assessment: 75 yo female here with CP (code STEMI cancelled) to begin heparin for r/o ACS.   Goal of Therapy:  Heparin level 0.3-0.7 units/ml Monitor platelets by anticoagulation protocol: Yes   Plan:  -Heparin bolus 3000 units IV followed by 600 units/hr (~12 units/kg/hr) -Heparin level in 8 hours and daily wth CBC daily  Hildred Laser, Pharm D 07/08/2016 8:13 AM

## 2016-07-08 NOTE — ED Notes (Signed)
MD cooper at bedside.

## 2016-07-08 NOTE — H&P (Signed)
History & Physical    Patient ID: Janautica Netzley Zappone MRN: [id number], DOB/AGE: 1941-02-03   Admit date: 07/08/2016   Primary Physician: Ria Bush, MD Primary Cardiologist: Dr. Burt Knack  Patient Profile    75 yo female with PMH of CAD, HTN, HLD, GERD, CVA, chronic combined HF who presented to the ED with reports of left sided chest/arm pain, was initially called a STEMI but canceled.   Past Medical History    Past Medical History:  Diagnosis Date  . Arthritis   . CAD (coronary artery disease) 03/2009   s/p MI  . Chronic combined systolic and diastolic heart failure ([place]) 06/17/2009   Qualifier: Diagnosis of  By: Burt Knack, MD, Clayburn Pert   . CKD (chronic kidney disease) stage 3, GFR 30-59 ml/min   . Emphysema/COPD ([place])   . Ex-smoker quit 2008  . GERD (gastroesophageal reflux disease)   . HCAP (healthcare-associated pneumonia) 03/19/2016  . Helicobacter pylori gastritis 10/2007   treated  . History of CVA (cerebrovascular accident) without residual deficits 01/2012, 06/2012   R hemorrhagic MCA 01/2012 with remote lacunar infarct L putamen and IC, rpt 06/2012 acute multifocal R MCA infarct with remote hemorrhagic strokes affecting L basal ganglia and periventricular white matter, full recovery  . HLD (hyperlipidemia)   . HTN (hypertension)   . Ischemic cardiomyopathy 02/27/2014  . Lung cancer ([place]) dx'd 07/2006   Lung CA, s/p resection, followed by Dr Earlie Server  . Macular degeneration   . Myocardial infarction 03/2009   Acute myocardial infarction 2010 - treated with BMS of LCx. LVEF 50% with subsequent CHF  . Osteoporosis 09/2013   T -3.6 forearm 09/2013, T -4.5 forearm 04/2015  . Stroke [facility])     Past Surgical History:  Procedure Laterality Date  . BREAST BIOPSY Left   . CARDIAC CATHETERIZATION N/A 02/24/2016   Procedure: Left Heart Cath and Coronary Angiography;  Surgeon: Belva Crome, MD;  Location: [place] [facility];  Service: Cardiovascular;  Laterality: N/A;  .  CATARACT EXTRACTION Bilateral   . CORONARY STENT PLACEMENT    . EYE SURGERY    . LUNG REMOVAL, PARTIAL Right 2008  . PARTIAL HYSTERECTOMY  1986   irregular periods, ovaries remain     Allergies  Allergies  Allergen Reactions  . Actonel [Risedronate Sodium] Other (See Comments)    Headache  . Amlodipine Other (See Comments)    Pedal edema  . Fosamax [Alendronate Sodium] Other (See Comments)    Unable to tolerate  . Lisinopril Cough  . Pravastatin Other (See Comments)    Constipation.  . Codeine Rash    History of Present Illness    Mrs. Margerum is a 75 yo female with PMH of CAD, HTN, HLD, GERD, CVA, chronic combined HF and COPD/Lung Ca. She initially presented with an acute MI in 2010 with LCx occlusion tx with primary PCI with BMS.  She also has a hx of R MCA stroke in 2013, non-small cell lung CA s/p resection, COPD.  She presented in 7/17 with an anterior STEMI and was found to have an apical occlusion of the LAD.  This was treated medically.  She was admitted in 9/17 with chest discomfort. Cardiac markers were normal. She was seen by Dr. Tamala Julian, who did her cardiac catheterization in 7/17. No further workup was recommended. Medical therapy was continued.   She was seen in the office by Dr. Burt Knack in 9/17, and was at her baseline. Unable to tolerate BB or ACEi with her lung  disease. Seen by Richardson Dopp on 06/22/16, continued on medical therapy with ASA, Plavix, nitrates, and statin. BNP at that visit was slightly elevated.   Reports she saw her PCP recently and dx with PNA. Placed on Zpac for 5 days, but continued to have productive rust colored sputum. Called back and given additional 5 days of antibiotics. Tells me she has also been experiencing palpitations, and the feeling of her heart racing over the past couple of weeks. Called the office and planned to have a monitor placed. Reports yesterday around 830am she developed left sided chest pain with radiation down into her left  upper arm, forearm and hand. Lasted about 2 hours then resolved. States she was then awoken from sleep about 5am this morning with the same chest pain. Again had aching, throbbing pain into her left arm and hand. States this time her hand became numb. Did have the sensation of palpitations this morning. This lasted about an hours prompting her to call EMS. States she was dyspneic, dizzy and nauseated. She was given SL nitro with relief of chest pain, but arm still hurt. EKG done by EMS and Code STEMI called. EKG on arrival showed SR with ST changes in the anterior leads, with LVH that is different from previous EKGs. On arrival was pain free. Second EKG showed improved ST changes. She was seen by Dr. Angelena Form in the ED and STEMI cancelled. Cardiology to admit for further work up.   Home Medications    Prior to Admission medications   Medication Sig Start Date End Date Taking? Authorizing Provider  albuterol (PROAIR HFA) 108 (90 Base) MCG/ACT inhaler Inhale 2 puffs into the lungs every 4 (four) hours as needed for wheezing or shortness of breath. 03/24/16  Yes Jose Shirl Harris, MD  aspirin EC 81 MG tablet Take 1 tablet (81 mg total) by mouth daily. 02/26/16  Yes Arbutus Leas, NP  azithromycin (ZITHROMAX Z-PAK) 250 MG tablet Take 2 pills by mouth today and then 1 pill daily for 4 days 07/03/16  Yes Abner Greenspan, MD  budesonide (PULMICORT) 0.5 MG/2ML nebulizer solution Take 2 mLs (0.5 mg total) by nebulization 2 (two) times daily. 03/25/16  Yes Collene Gobble, MD  Calcium Carbonate-Vitamin D (CALCIUM-VITAMIN D) 500-200 MG-UNIT per tablet Take 1 tablet by mouth 2 (two) times daily.    Yes Historical Provider, MD  Cholecalciferol (VITAMIN D) 2000 UNITS CAPS Take 1 capsule by mouth daily.   Yes Historical Provider, MD  clopidogrel (PLAVIX) 75 MG tablet Take 1 tablet (75 mg total) by mouth daily. 02/26/16  Yes Arbutus Leas, NP  furosemide (LASIX) 20 MG tablet Take 2 tablets by mouth daily on Monday, Wednesday  and Friday and one tablet by mouth daily all other days 06/23/16  Yes Sherren Mocha, MD  ipratropium-albuterol (DUONEB) 0.5-2.5 (3) MG/3ML SOLN Take 3 mLs by nebulization 2 (two) times daily.   Yes Historical Provider, MD  isosorbide mononitrate (IMDUR) 30 MG 24 hr tablet TAKE 1 TABLET BY MOUTH DAILY 05/25/16  Yes Sherren Mocha, MD  LOTEMAX 0.5 % ophthalmic suspension Place 1 drop into both eyes daily.  12/27/13  Yes Historical Provider, MD  nitroGLYCERIN (NITROSTAT) 0.4 MG SL tablet Place 1 tablet (0.4 mg total) under the tongue every 5 (five) minutes as needed for chest pain. 07/03/15  Yes Scott T Kathlen Mody, PA-C  pantoprazole (PROTONIX) 40 MG tablet Take 1 tablet (40 mg total) by mouth daily. 02/27/16  Yes Sherren Mocha, MD  simvastatin (ZOCOR) 40 MG tablet TAKE ONE TABLET BY MOUTH EVERY NIGHT AT BEDTIME 06/08/16  Yes Sherren Mocha, MD  spironolactone (ALDACTONE) 25 MG tablet TAKE 1/2 TABLET BY MOUTH ONCE DAILY 04/14/16  Yes Sherren Mocha, MD  denosumab (PROLIA) 60 MG/ML SOLN injection Inject 60 mg into the skin every 6 (six) months. Administer in upper arm, thigh, or abdomen 06/13/15   Ria Bush, MD    Family History    Family History  Problem Relation Age of Onset  . CAD Mother 72    MI  . Stroke Mother   . Hypertension Mother   . Heart attack Mother   . CAD Father 27  . Stroke Father   . Breast cancer Sister   . Diabetes Sister     Social History    Social History   Social History  . Marital status: Widowed    Spouse name: N/A  . Number of children: 1  . Years of education: N/A   Occupational History  . retired     Insurance underwriter   Social History Main Topics  . Smoking status: Former Smoker    Types: Cigarettes  . Smokeless tobacco: Never Used  . Alcohol use No  . Drug use: No  . Sexual activity: No   Other Topics Concern  . Not on file   Social History Narrative   The patient is a widow and has one son and one dog.     Occ: she used to work in Scientist, research (medical) and used  to smoke a half a pack of cigarettes a day.  She does not drink alcohol. 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   Ed: HS   Activity: no regular exercise   Diet: some water, some fruits/vegetables    Lives in a one story home.      Review of Systems    General:  No chills, fever, night sweats or weight changes.  Cardiovascular: See HPI Dermatological: No rash, lesions/masses Respiratory: No cough, ++ dyspnea Urologic: No hematuria, dysuria Abdominal:   ++ nausea, vomiting, diarrhea, bright red blood per rectum, melena, or hematemesis Neurologic:  No visual changes, wkns, changes in mental status. All other systems reviewed and are otherwise negative except as noted above.  Physical Exam    Blood pressure 108/70, pulse 65, temperature 98.1 F (36.7 C), temperature source Oral, resp. rate 20, height '5\' 6"'$  (1.676 m), weight 114 lb (51.7 kg), SpO2 94 %.  General: Pleasant thin older female, NAD Psych: Normal affect. Neuro: Alert and oriented X 3. Moves all extremities spontaneously. HEENT: Normal  Neck: Supple without bruits or JVD. Lungs:  Resp regular and unlabored, Expiratory wheezing. Heart: RRR no s3, s4, or murmurs. Abdomen: Soft, non-tender, non-distended, BS + x 4.  Extremities: No clubbing, cyanosis or edema. DP/PT/Radials 2+ and equal bilaterally.  Labs    Troponin (Point of Care Test) No results for input(s): TROPIPOC in the last 72 hours.  Recent Labs  07/08/16 0800  TROPONINI <0.03   Lab Results  Component Value Date   WBC 8.3 07/08/2016   HGB 14.3 07/08/2016   HCT 42.0 07/08/2016   MCV 91.9 07/08/2016   PLT 297 07/08/2016    Recent Labs Lab 07/08/16 0800 07/08/16 0812  NA 140 140  K 4.4 4.3  CL 106 103  CO2 26  --   BUN 12 14  CREATININE 0.83 0.80  CALCIUM 9.9  --   PROT 6.7  --   BILITOT 0.7  --  ALKPHOS 67  --   ALT 21  --   AST 22  --   GLUCOSE 106* 105*   Lab Results  Component Value Date   CHOL 139 07/08/2016    HDL 65 07/08/2016   LDLCALC 60 07/08/2016   TRIG 71 07/08/2016   Lab Results  Component Value Date   DDIMER 1.53 (H) 03/11/2016     Radiology Studies    Dg Chest 2 View  Result Date: 06/29/2016 CLINICAL DATA:  History of COPD. Productive cough for over week. History of lung cancer post resection. EXAM: CHEST  2 VIEW COMPARISON:  CT of the chest 06/22/2016 FINDINGS: Cardiomediastinal silhouette is normal. Mediastinal contours appear intact. There are nodular opacities in the right middle and right lower lobe. Right pleural reflection is stable. Postsurgical changes in the hilum from prior lobectomy. Osseous structures are without acute abnormality. Soft tissues are grossly normal. IMPRESSION: Scattered nodular airspace opacities in the right middle and right lower lobe. These may represent infectious consolidation, however soft tissue pulmonary masses cannot be excluded either. Electronically Signed   By: Fidela Salisbury M.D.   On: 06/29/2016 12:44   Ct Chest Wo Contrast  Result Date: 06/22/2016 CLINICAL DATA:  COPD.  Six-month follow-up EXAM: CT CHEST WITHOUT CONTRAST TECHNIQUE: Multidetector CT imaging of the chest was performed following the standard protocol without IV contrast. COMPARISON:  Chest CT 03/19/2016 FINDINGS: Cardiovascular: Coronary artery calcification and aortic atherosclerotic calcification. Mediastinum/Nodes: No axillary or supraclavicular lymphadenopathy. Subcentimeter mediastinal lymph nodes are not changed. Lungs/Pleura: New bilobed nodule in new RIGHT middle lobe with individual nodules measuring 12 and 10 mm (image 128, series 3). Nodule in the posterior aspect of the RIGHT lower lobe along the pleural surface measures 19 mm (image 122, series 3) reduced from 28 mm. RIGHT infrahilar nodular thickening measuring 13 mm not changed from 13 mm (1, series 3). In LEFT lung, LEFT lower lobe nodule measuring 15 mm is unchanged. Extensive centrilobular emphysema the upper lobes  Upper Abdomen: Limited view of the liver, kidneys, pancreas are unremarkable. Normal adrenal glands. Musculoskeletal: No aggressive osseous lesion IMPRESSION: 1. Waxing and waning pulmonary nodules with new 2 new pulmonary nodules in the RIGHT middle lobe. Findings suggest infectious or inflammatory process. In patient with smoking history, Recommend follow-up CT in 3-6 months. 2. Stable mediastinal adenopathy is likely reactive. Electronically Signed   By: Suzy Bouchard M.D.   On: 06/22/2016 16:16   Dg Chest Portable 1 View  Result Date: 07/08/2016 CLINICAL DATA:  Chest pain EXAM: PORTABLE CHEST 1 VIEW COMPARISON:  June 29, 2016 chest radiograph and chest CT June 22, 2016 FINDINGS: Nodular opacities remain in the right base without appreciable change compared to recent studies. No new opacities are evident. Lungs appear somewhat hyperexpanded. Heart size and pulmonary vascularity are normal. There is atherosclerotic calcification aorta. No adenopathy. No bone lesions. No pneumothorax. IMPRESSION: Nodular opacities persist in the right base. Etiology uncertain; please see recommendations from recent chest CT report. No new opacity. Lungs somewhat hyperexpanded. Stable cardiac silhouette. There is aortic atherosclerosis. Electronically Signed   By: Lowella Grip III M.D.   On: 07/08/2016 08:33   Mm Digital Screening Bilateral  Result Date: 06/09/2016 CLINICAL DATA:  Screening. EXAM: DIGITAL SCREENING BILATERAL MAMMOGRAM WITH CAD COMPARISON:  Previous exam(s). ACR Breast Density Category b: There are scattered areas of fibroglandular density. FINDINGS: There are no findings suspicious for malignancy. Images were processed with CAD. IMPRESSION: No mammographic evidence of malignancy. A result letter of this  screening mammogram will be mailed directly to the patient. RECOMMENDATION: Screening mammogram in one year. (Code:SM-B-01Y) BI-RADS CATEGORY  1: Negative. Electronically Signed   By: Evangeline Dakin M.D.   On: 06/09/2016 17:07    ECG & Cardiac Imaging    EKG: SR with ST change, with large T waves in the anterior leads, with LVH but different from previous   Echo: 03/20/16  Study Conclusions  - Left ventricle: The cavity size was mildly dilated. Wall   thickness was normal. Systolic function was mildly to moderately   reduced. The estimated ejection fraction was in the range of 40%   to 45%. Severe hypokinesis of the basal-midanterolateral and   inferolateral myocardium; consistent with infarction in the   distribution of the left circumflex coronary artery. Features are   consistent with a pseudonormal left ventricular filling pattern,   with concomitant abnormal relaxation and increased filling   pressure (grade 2 diastolic dysfunction). - Mitral valve: Calcified annulus. There was mild regurgitation. - Left atrium: The atrium was mildly dilated. - Pulmonary arteries: Systolic pressure was mildly increased. PA   peak pressure: 45 mm Hg (S).  LHC: 02/24/16  Conclusion     Prox LAD lesion, 65 %stenosed.  Ost 1st Diag lesion, 65 %stenosed.  Prox Cx to Mid Cx lesion, 10 %stenosed.  The left ventricular ejection fraction is 35-45% by visual estimate.  LV end diastolic pressure is normal.  There is moderate left ventricular systolic dysfunction.  Dist LAD lesion, 100 %stenosed.    Acute coronary syndrome with occlusion of distal LAD in a small tortuous segment.  Widely patent stent in the circumflex.  Eccentric 65% stenosis in the mid LAD beyond the first agonal. 60% first diagonal ostial stenosis. Both sites are unchanged from prior angiogram. New finding of very distal LAD total occlusion.  Dominant right coronary artery with luminal irregularities.  Moderate left ventricular systolic dysfunction with regional wall motion abnormality including new apical severe hypokinesis and suggestion of anterolateral wall akinesis/dyskinesis (old). Estimated  ejection fraction is 35%.  LVEDP is low normal.  RECOMMENDATIONS:   Serial cardiac markers  Add Plavix to medical regimen.  IV hydration  Risk factor modification  Anticoagulation     Assessment & Plan    75 yo female with PMH of CAD, HTN, HLD, GERD, CVA, chronic combined HF who presented to the ED with reports of left sided chest/arm pain, was initially called a STEMI but canceled.   1. Chest pain: Reports this started yesterday with radiation into her left arm. Resolved after a couple of hours. Pain returned again this morning at 5am with left arm pain, and numbness. Improved with SL nitro, and resolved with morphine. EKG did show ST changes in anterior leads on arrival to the ED, but improved with repeat EKG. POC trop negx1. Placed on IV heparin. Last cath in 7/17 showed occlusion of distal LAD not amendable to PCI, with patent LCx stents, 65% mid LAD and 60% 1st diag.  -- Admit to telemetry -- cycle enzymes, may need repeat cath, but currently without pain. Given her last cath was 7/17 doubt any progression of disease.  -- stop IV heparin  2. Chronic combined HF: Appears euvolemic on exam. Last Echo 8/17 EF 40% with G2DD.  -- continue Imdur, spironolactone  3. HTN: Stable with current therapy  4. HLD: On simvastatin '40mg'$  daily  5. COPD/dyspnea: recently placed on antibiotics for suspected PNA. Completed course yesterday. No fever, no WBC --Continue home inhalers  --  last CT noted 2 new pulmonary nodules to right middle lobe. Followed by pulmonary.  -- check echo, and BNP  Signed, Reino Bellis, NP-C Pager [phone] 07/08/2016, 10:11 AM  Patient seen, examined. Available data reviewed. Agree with findings, assessment, and plan as outlined by Reino Bellis, NP-C. the patient is well-known to me. She has chronic dyspnea related to COPD. She presents with recurrent left upper arm discomfort with numbness extending into the hand. She had some pain at the left  lateral chest but no substernal discomfort. On exam, she is alert and oriented, somewhat frail-appearing elderly woman in no distress. JVP is normal, no carotid bruits, lungs are clear, heart is regular rate and rhythm without murmur, abdomen soft nontender, extremities without edema. I have reviewed her EKGs. Her initial presenting EKG demonstrated a left IVCD. An EKG just a few minutes later showed resolution of her conduction the leg and there are diffuse ST depressions. Her initial cardiac enzymes are negative. I personally reviewed her most recent cath and echo images. Her cardiac catheterization earlier this year demonstrated widely patent coronary arteries and a patent stent in the circumflex. There was a moderate mid to distal LAD stenosis and apical occlusion of the LAD, but these areas are in small diameter vessels. The patient's echo study showed no evidence of valvular disease in her LVEF is stable from previous studies.  With a recent heart catheterization as documented, would not anticipate repeat cath unless symptoms recur or enzymes increase. Will admit for overnight observation, cycle cardiac enzymes, and repeat an echocardiogram. Will reassess her clinical status in the morning. Will discontinue heparin and less her troponin becomes abnormal.  Sherren Mocha, M.D. 07/08/2016 12:35 PM

## 2016-07-09 ENCOUNTER — Encounter (HOSPITAL_COMMUNITY)
Admission: EM | Disposition: A | Discharge status: Home / Self Care | Payer: Self-pay | Source: Ambulatory Visit | Attending: Cardiovascular Disease

## 2016-07-09 DIAGNOSIS — K219 Gastro-esophageal reflux disease without esophagitis: Secondary | ICD-10-CM | POA: Diagnosis not present

## 2016-07-09 DIAGNOSIS — M810 Age-related osteoporosis without current pathological fracture: Secondary | ICD-10-CM | POA: Diagnosis not present

## 2016-07-09 DIAGNOSIS — I110 Hypertensive heart disease with heart failure: Secondary | ICD-10-CM | POA: Diagnosis not present

## 2016-07-09 DIAGNOSIS — I5043 Acute on chronic combined systolic (congestive) and diastolic (congestive) heart failure: Secondary | ICD-10-CM | POA: Diagnosis not present

## 2016-07-09 DIAGNOSIS — Z8249 Family history of ischemic heart disease and other diseases of the circulatory system: Secondary | ICD-10-CM | POA: Diagnosis not present

## 2016-07-09 DIAGNOSIS — I083 Combined rheumatic disorders of mitral, aortic and tricuspid valves: Secondary | ICD-10-CM | POA: Diagnosis not present

## 2016-07-09 DIAGNOSIS — N183 Chronic kidney disease, stage 3 (moderate): Secondary | ICD-10-CM | POA: Diagnosis not present

## 2016-07-09 DIAGNOSIS — Z8673 Personal history of transient ischemic attack (TIA), and cerebral infarction without residual deficits: Secondary | ICD-10-CM | POA: Diagnosis not present

## 2016-07-09 DIAGNOSIS — J189 Pneumonia, unspecified organism: Secondary | ICD-10-CM | POA: Diagnosis not present

## 2016-07-09 DIAGNOSIS — I5042 Chronic combined systolic (congestive) and diastolic (congestive) heart failure: Secondary | ICD-10-CM | POA: Diagnosis not present

## 2016-07-09 DIAGNOSIS — E559 Vitamin D deficiency, unspecified: Secondary | ICD-10-CM | POA: Diagnosis not present

## 2016-07-09 DIAGNOSIS — I5023 Acute on chronic systolic (congestive) heart failure: Secondary | ICD-10-CM

## 2016-07-09 DIAGNOSIS — I249 Acute ischemic heart disease, unspecified: Secondary | ICD-10-CM | POA: Diagnosis not present

## 2016-07-09 DIAGNOSIS — I214 Non-ST elevation (NSTEMI) myocardial infarction: Secondary | ICD-10-CM

## 2016-07-09 DIAGNOSIS — Z7902 Long term (current) use of antithrombotics/antiplatelets: Secondary | ICD-10-CM | POA: Diagnosis not present

## 2016-07-09 DIAGNOSIS — Z888 Allergy status to other drugs, medicaments and biological substances status: Secondary | ICD-10-CM | POA: Diagnosis not present

## 2016-07-09 DIAGNOSIS — H3530 Unspecified macular degeneration: Secondary | ICD-10-CM | POA: Diagnosis not present

## 2016-07-09 DIAGNOSIS — Z7982 Long term (current) use of aspirin: Secondary | ICD-10-CM | POA: Diagnosis not present

## 2016-07-09 DIAGNOSIS — I2510 Atherosclerotic heart disease of native coronary artery without angina pectoris: Secondary | ICD-10-CM | POA: Diagnosis not present

## 2016-07-09 DIAGNOSIS — E785 Hyperlipidemia, unspecified: Secondary | ICD-10-CM | POA: Diagnosis not present

## 2016-07-09 DIAGNOSIS — I255 Ischemic cardiomyopathy: Secondary | ICD-10-CM | POA: Diagnosis not present

## 2016-07-09 DIAGNOSIS — I130 Hypertensive heart and chronic kidney disease with heart failure and stage 1 through stage 4 chronic kidney disease, or unspecified chronic kidney disease: Secondary | ICD-10-CM | POA: Diagnosis not present

## 2016-07-09 DIAGNOSIS — Z79899 Other long term (current) drug therapy: Secondary | ICD-10-CM | POA: Diagnosis not present

## 2016-07-09 DIAGNOSIS — J440 Chronic obstructive pulmonary disease with acute lower respiratory infection: Secondary | ICD-10-CM | POA: Diagnosis not present

## 2016-07-09 DIAGNOSIS — R079 Chest pain, unspecified: Secondary | ICD-10-CM | POA: Diagnosis not present

## 2016-07-09 DIAGNOSIS — Z66 Do not resuscitate: Secondary | ICD-10-CM | POA: Diagnosis not present

## 2016-07-09 DIAGNOSIS — I2582 Chronic total occlusion of coronary artery: Secondary | ICD-10-CM | POA: Diagnosis not present

## 2016-07-09 DIAGNOSIS — Z885 Allergy status to narcotic agent status: Secondary | ICD-10-CM | POA: Diagnosis not present

## 2016-07-09 HISTORY — PX: CARDIAC CATHETERIZATION: SHX172

## 2016-07-09 LAB — BASIC METABOLIC PANEL
Anion gap: 8 (ref 5–15)
BUN: 13 mg/dL (ref 6–20)
CHLORIDE: 102 mmol/L (ref 101–111)
CO2: 28 mmol/L (ref 22–32)
CREATININE: 0.87 mg/dL (ref 0.44–1.00)
Calcium: 8.6 mg/dL — ABNORMAL LOW (ref 8.9–10.3)
GFR calc non Af Amer: >60 mL/min (ref >60)
GLUCOSE: 95 mg/dL (ref 65–99)
Potassium: 4 mmol/L (ref 3.5–5.1)
Sodium: 138 mmol/L (ref 135–145)

## 2016-07-09 LAB — POCT ACTIVATED CLOTTING TIME: ACTIVATED CLOTTING TIME: 450 s

## 2016-07-09 LAB — TROPONIN I: Troponin I: 0.09 ng/mL (ref <0.03)

## 2016-07-09 SURGERY — LEFT HEART CATH AND CORONARY ANGIOGRAPHY
Anesthesia: LOCAL

## 2016-07-09 MED ORDER — BIVALIRUDIN BOLUS VIA INFUSION - CUPID
INTRAVENOUS | Status: DC | PRN
  Administered 2016-07-09: 39 mg via INTRAVENOUS

## 2016-07-09 MED ORDER — LIDOCAINE HCL (PF) 1 % IJ SOLN
INTRAMUSCULAR | Status: AC
  Filled 2016-07-09: qty 30

## 2016-07-09 MED ORDER — BIVALIRUDIN 250 MG IV SOLR
INTRAVENOUS | Status: AC
  Filled 2016-07-09: qty 250

## 2016-07-09 MED ORDER — SODIUM CHLORIDE 0.9% FLUSH: 3.0000 mL | INTRAVENOUS | Status: DC | PRN

## 2016-07-09 MED ORDER — SODIUM CHLORIDE 0.9% FLUSH
3.0000 mL | Freq: Two times a day (BID) | INTRAVENOUS | Status: DC
  Administered 2016-07-09: 3 mL via INTRAVENOUS

## 2016-07-09 MED ORDER — BIVALIRUDIN 250 MG IV SOLR
INTRAVENOUS | Status: DC | PRN
  Administered 2016-07-09: 1.75 mg/kg/h via INTRAVENOUS

## 2016-07-09 MED ORDER — HEART ATTACK BOUNCING BOOK
Freq: Once | Status: AC
  Administered 2016-07-09: 20:00:00
  Filled 2016-07-09: qty 1

## 2016-07-09 MED ORDER — SODIUM CHLORIDE 0.9 % IV SOLN
INTRAVENOUS | Status: DC
Start: 2016-07-10 — End: 2016-07-09

## 2016-07-09 MED ORDER — HEPARIN (PORCINE) IN NACL 100-0.45 UNIT/ML-% IJ SOLN
600.0000 [IU]/h | INTRAMUSCULAR | Status: DC
  Administered 2016-07-09: 600 [IU]/h via INTRAVENOUS
  Filled 2016-07-09: qty 250

## 2016-07-09 MED ORDER — ANGIOPLASTY BOOK
Freq: Once | Status: AC
  Administered 2016-07-09: 20:00:00
  Filled 2016-07-09: qty 1

## 2016-07-09 MED ORDER — SODIUM CHLORIDE 0.9% FLUSH: 3.0000 mL | Freq: Two times a day (BID) | INTRAVENOUS | Status: DC

## 2016-07-09 MED ORDER — NITROGLYCERIN 1 MG/10 ML FOR IR/CATH LAB
INTRA_ARTERIAL | Status: AC
  Filled 2016-07-09: qty 10

## 2016-07-09 MED ORDER — HYDRALAZINE HCL 20 MG/ML IJ SOLN: 5.0000 mg | INTRAMUSCULAR | Status: AC | PRN

## 2016-07-09 MED ORDER — LABETALOL HCL 5 MG/ML IV SOLN: 10.0000 mg | INTRAVENOUS | Status: AC | PRN

## 2016-07-09 MED ORDER — FENTANYL CITRATE (PF) 100 MCG/2ML IJ SOLN
INTRAMUSCULAR | Status: DC | PRN
  Administered 2016-07-09: 25 ug via INTRAVENOUS

## 2016-07-09 MED ORDER — IOPAMIDOL (ISOVUE-370) INJECTION 76%
INTRAVENOUS | Status: AC
  Filled 2016-07-09: qty 50

## 2016-07-09 MED ORDER — MIDAZOLAM HCL 2 MG/2ML IJ SOLN
INTRAMUSCULAR | Status: DC | PRN
  Administered 2016-07-09: 1 mg via INTRAVENOUS

## 2016-07-09 MED ORDER — HEPARIN (PORCINE) IN NACL 2-0.9 UNIT/ML-% IJ SOLN
INTRAMUSCULAR | Status: AC
  Filled 2016-07-09: qty 1000

## 2016-07-09 MED ORDER — SODIUM CHLORIDE 0.9 % IV SOLN: 250.0000 mL | INTRAVENOUS | Status: DC | PRN

## 2016-07-09 MED ORDER — HEPARIN (PORCINE) IN NACL 2-0.9 UNIT/ML-% IJ SOLN
INTRAMUSCULAR | Status: DC | PRN
  Administered 2016-07-09: 1000 mL

## 2016-07-09 MED ORDER — SODIUM CHLORIDE 0.9 % WEIGHT BASED INFUSION
1.0000 mL/kg/h | INTRAVENOUS | Status: AC
  Administered 2016-07-09: 1 mL/kg/h via INTRAVENOUS

## 2016-07-09 MED ORDER — SODIUM CHLORIDE 0.9 % IV SOLN: INTRAVENOUS | Status: DC

## 2016-07-09 MED ORDER — FENTANYL CITRATE (PF) 100 MCG/2ML IJ SOLN
INTRAMUSCULAR | Status: AC
  Filled 2016-07-09: qty 2

## 2016-07-09 MED ORDER — MIDAZOLAM HCL 2 MG/2ML IJ SOLN
INTRAMUSCULAR | Status: AC
  Filled 2016-07-09: qty 2

## 2016-07-09 MED ORDER — LIDOCAINE HCL (PF) 1 % IJ SOLN
INTRAMUSCULAR | Status: DC | PRN
  Administered 2016-07-09: 9 mL via SUBCUTANEOUS

## 2016-07-09 MED ORDER — HEPARIN BOLUS VIA INFUSION
2000.0000 [IU] | Freq: Once | INTRAVENOUS | Status: AC
  Administered 2016-07-09: 2000 [IU] via INTRAVENOUS
  Filled 2016-07-09: qty 2000

## 2016-07-09 MED ORDER — ENOXAPARIN SODIUM 40 MG/0.4ML ~~LOC~~ SOLN
40.0000 mg | SUBCUTANEOUS | Status: DC
  Administered 2016-07-10: 40 mg via SUBCUTANEOUS
  Filled 2016-07-09: qty 0.4

## 2016-07-09 MED ORDER — IOPAMIDOL (ISOVUE-370) INJECTION 76%
INTRAVENOUS | Status: DC | PRN
  Administered 2016-07-09: 120 mL via INTRA_ARTERIAL

## 2016-07-09 SURGICAL SUPPLY — 17 items
BALLN MOZEC 2.50X9 (BALLOONS) ×2
BALLN ~~LOC~~ EMERGE MR 3.25X6 (BALLOONS) ×2
BALLOON MOZEC 2.50X9 (BALLOONS) IMPLANT
BALLOON ~~LOC~~ EMERGE MR 3.25X6 (BALLOONS) IMPLANT
CATH INFINITI 5FR MULTPACK ANG (CATHETERS) ×1 IMPLANT
CATH VISTA GUIDE 6FR XBLAD3.5 (CATHETERS) ×1 IMPLANT
KIT ENCORE 26 ADVANTAGE (KITS) ×2 IMPLANT
KIT HEART LEFT (KITS) ×2 IMPLANT
PACK CARDIAC CATHETERIZATION (CUSTOM PROCEDURE TRAY) ×2 IMPLANT
SHEATH PINNACLE 5F 10CM (SHEATH) ×1 IMPLANT
SHEATH PINNACLE 6F 10CM (SHEATH) ×1 IMPLANT
STENT PROMUS PREM MR 3.0X8 (Permanent Stent) ×1 IMPLANT
SYR MEDRAD MARK V 150ML (SYRINGE) ×2 IMPLANT
TRANSDUCER W/STOPCOCK (MISCELLANEOUS) ×2 IMPLANT
TUBING CIL FLEX 10 FLL-RA (TUBING) ×2 IMPLANT
WIRE ASAHI PROWATER 180CM (WIRE) ×1 IMPLANT
WIRE EMERALD 3MM-J .035X150CM (WIRE) ×1 IMPLANT

## 2016-07-09 NOTE — Interval H&P Note (Signed)
History and Physical Interval Note:  07/09/2016 1:36 PM  [name]  has presented today for surgery, with the diagnosis of nstemi  The various methods of treatment have been discussed with the patient and family. After consideration of risks, benefits and other options for treatment, the patient has consented to  Procedure(s): Left Heart Cath and Coronary Angiography (N/A) as a surgical intervention .  The patient's history has been reviewed, patient examined, no change in status, stable for surgery.  I have reviewed the patient's chart and labs.  Questions were answered to the patient's satisfaction.   Cath Lab Visit (complete for each Cath Lab visit)  Clinical Evaluation Leading to the Procedure:   ACS: Yes.    Non-ACS:    Anginal Classification: CCS IV  Anti-ischemic medical therapy: Minimal Therapy (1 class of medications)  Non-Invasive Test Results: No non-invasive testing performed  Prior CABG: No previous CABG        [facility] 07/09/2016 1:36 PM

## 2016-07-09 NOTE — Progress Notes (Signed)
Subjective:  The patient had another episode of left arm pain overnight. States this lasted about 5 minutes and resolved spontaneously. Shortness of breath is unchanged. Continues to have a cough.  Objective:  Vital Signs in the last 24 hours: Temp:  [97.6 F (36.4 C)-98.3 F (36.8 C)] 98.3 F (36.8 C) (12/14 0432) Pulse Rate:  [61-74] 67 (12/14 0432) Resp:  [16-25] 20 (12/14 0432) BP: (90-123)/(63-79) 107/63 (12/14 0432) SpO2:  [88 %-99 %] 96 % (12/14 0823) Weight:  [111 lb 9.6 oz (50.6 kg)-114 lb 11.2 oz (52 kg)] 114 lb 11.2 oz (52 kg) (12/14 0235)  Intake/Output from previous day: 12/13 0701 - 12/14 0700 In: 498.1 [P.O.:480; I.V.:18.1] Out: 300 [Urine:300]  Physical Exam: Pt is alert and oriented, NAD HEENT: normal Neck: JVP - normal Lungs: Rhonchi bilaterally CV: RRR without murmur or gallop Abd: soft, NT, Positive BS, no hepatomegaly Ext: no C/C/E, distal pulses intact and equal Skin: warm/dry no rash   Lab Results:  Recent Labs  07/08/16 0800 07/08/16 0812 07/08/16 1324  WBC 8.3  --  7.3  HGB 14.1 14.3 13.6  PLT 297  --  302    Recent Labs  07/08/16 0800 07/08/16 0812 07/08/16 1324 07/09/16 0204  NA 140 140  --  138  K 4.4 4.3  --  4.0  CL 106 103  --  102  CO2 26  --   --  28  GLUCOSE 106* 105*  --  95  BUN 12 14  --  13  CREATININE 0.83 0.80 0.93 0.87    Recent Labs  07/08/16 1827 07/09/16 0204  TROPONINI 0.04* 0.09*    Cardiac Studies: 2D Echo: Study Conclusions  - Left ventricle: Septal inferior and inferior lateral wall   hypokinesis The cavity size was moderately dilated. Wall   thickness was increased in a pattern of mild LVH. Systolic   function was moderately to severely reduced. The estimated   ejection fraction was in the range of 30% to 35%. - Aortic valve: There was mild regurgitation. - Mitral valve: There was moderate regurgitation. - Left atrium: The atrium was mildly dilated. - Right atrium: The atrium was  mildly dilated. - Atrial septum: No defect or patent foramen ovale was identified. - Tricuspid valve: There was moderate regurgitation. - Pulmonary arteries: PA peak pressure: 39 mm Hg (S).  Tele: Sinus rhythm with PVCs and intermittent IVCD  Assessment/Plan:  1. Non-ST elevation MI: Patient with recurrent left upper arm discomfort last night. Troponin is now elevated, albeit mild at 0.09. With known CAD, recurrent symptoms, and progressive LV systolic dysfunction, recommend cardiac catheterization and possible PCI. Will start IV heparin. The patient is treated with dual antiplatelet therapy (aspirin and Plavix), long-acting nitrate, and a statin drug. She has not been able to tolerate beta blockers.  I have reviewed the risks, indications, and alternatives to cardiac catheterization, possible angioplasty, and stenting with the patient. Risks include but are not limited to bleeding, infection, vascular injury, stroke, myocardial infection, arrhythmia, kidney injury, radiation-related injury in the case of prolonged fluoroscopy use, emergency cardiac surgery, and death. The patient understands the risks of serious complication is 1-2 in [id number] with diagnostic cardiac cath and 1-2% or less with angioplasty/stenting.   2. Acute on chronic systolic heart failure: Continue low-dose Lasix and Aldactone. Will measure LVEDP and the Cath Lab today. May try her on a low dose of losartan, but would wait until after angiography.  3. Hyperlipidemia: Continue simvastatin.  4.  CODE STATUS: Lengthy discussion with the patient this morning. While we will continue diagnostic and therapeutic interventions, the patient does not wish to be resuscitated if something catastrophic were to occur. She specifically would not want CPR, defibrillation, or mechanical ventilation. Will change her CODE STATUS to DO NOT RESUSCITATE.   Sherren Mocha, M.D. 07/09/2016, 8:50 AM

## 2016-07-09 NOTE — H&P (View-Only) (Signed)
Subjective:  The patient had another episode of left arm pain overnight. States this lasted about 5 minutes and resolved spontaneously. Shortness of breath is unchanged. Continues to have a cough.  Objective:  Vital Signs in the last 24 hours: Temp:  [97.6 F (36.4 C)-98.3 F (36.8 C)] 98.3 F (36.8 C) (12/14 0432) Pulse Rate:  [61-74] 67 (12/14 0432) Resp:  [16-25] 20 (12/14 0432) BP: (90-123)/(63-79) 107/63 (12/14 0432) SpO2:  [88 %-99 %] 96 % (12/14 0823) Weight:  [111 lb 9.6 oz (50.6 kg)-114 lb 11.2 oz (52 kg)] 114 lb 11.2 oz (52 kg) (12/14 0235)  Intake/Output from previous day: 12/13 0701 - 12/14 0700 In: 498.1 [P.O.:480; I.V.:18.1] Out: 300 [Urine:300]  Physical Exam: Pt is alert and oriented, NAD HEENT: normal Neck: JVP - normal Lungs: Rhonchi bilaterally CV: RRR without murmur or gallop Abd: soft, NT, Positive BS, no hepatomegaly Ext: no C/C/E, distal pulses intact and equal Skin: warm/dry no rash   Lab Results:  Recent Labs  07/08/16 0800 07/08/16 0812 07/08/16 1324  WBC 8.3  --  7.3  HGB 14.1 14.3 13.6  PLT 297  --  302    Recent Labs  07/08/16 0800 07/08/16 0812 07/08/16 1324 07/09/16 0204  NA 140 140  --  138  K 4.4 4.3  --  4.0  CL 106 103  --  102  CO2 26  --   --  28  GLUCOSE 106* 105*  --  95  BUN 12 14  --  13  CREATININE 0.83 0.80 0.93 0.87    Recent Labs  07/08/16 1827 07/09/16 0204  TROPONINI 0.04* 0.09*    Cardiac Studies: 2D Echo: Study Conclusions  - Left ventricle: Septal inferior and inferior lateral wall   hypokinesis The cavity size was moderately dilated. Wall   thickness was increased in a pattern of mild LVH. Systolic   function was moderately to severely reduced. The estimated   ejection fraction was in the range of 30% to 35%. - Aortic valve: There was mild regurgitation. - Mitral valve: There was moderate regurgitation. - Left atrium: The atrium was mildly dilated. - Right atrium: The atrium was  mildly dilated. - Atrial septum: No defect or patent foramen ovale was identified. - Tricuspid valve: There was moderate regurgitation. - Pulmonary arteries: PA peak pressure: 39 mm Hg (S).  Tele: Sinus rhythm with PVCs and intermittent IVCD  Assessment/Plan:  1. Non-ST elevation MI: Patient with recurrent left upper arm discomfort last night. Troponin is now elevated, albeit mild at 0.09. With known CAD, recurrent symptoms, and progressive LV systolic dysfunction, recommend cardiac catheterization and possible PCI. Will start IV heparin. The patient is treated with dual antiplatelet therapy (aspirin and Plavix), long-acting nitrate, and a statin drug. She has not been able to tolerate beta blockers.  I have reviewed the risks, indications, and alternatives to cardiac catheterization, possible angioplasty, and stenting with the patient. Risks include but are not limited to bleeding, infection, vascular injury, stroke, myocardial infection, arrhythmia, kidney injury, radiation-related injury in the case of prolonged fluoroscopy use, emergency cardiac surgery, and death. The patient understands the risks of serious complication is 1-2 in 2725 with diagnostic cardiac cath and 1-2% or less with angioplasty/stenting.   2. Acute on chronic systolic heart failure: Continue low-dose Lasix and Aldactone. Will measure LVEDP and the Cath Lab today. May try her on a low dose of losartan, but would wait until after angiography.  3. Hyperlipidemia: Continue simvastatin.  4.  CODE STATUS: Lengthy discussion with the patient this morning. While we will continue diagnostic and therapeutic interventions, the patient does not wish to be resuscitated if something catastrophic were to occur. She specifically would not want CPR, defibrillation, or mechanical ventilation. Will change her CODE STATUS to DO NOT RESUSCITATE.   Sherren Mocha, M.D. 07/09/2016, 8:50 AM

## 2016-07-09 NOTE — Progress Notes (Signed)
Pt troponin elevated to 0.09 denies chest pain or discomfort, pt wanting to discuss changing code status to DNR , PA notified.  Edward Qualia RN

## 2016-07-09 NOTE — Progress Notes (Signed)
ANTICOAGULATION CONSULT NOTE - Initial Consult  Pharmacy Consult for heparin Indication: chest pain/ACS  Allergies  Allergen Reactions  . Actonel [Risedronate Sodium] Other (See Comments)    Headache  . Amlodipine Other (See Comments)    Pedal edema  . Fosamax [Alendronate Sodium] Other (See Comments)    Unable to tolerate  . Lisinopril Cough  . Pravastatin Other (See Comments)    Constipation.  . Codeine Rash    Vital Signs: Temp: 98.3 F (36.8 C) (12/14 0432) Temp Source: Oral (12/14 0432) BP: 107/63 (12/14 0432) Pulse Rate: 67 (12/14 0432)  Labs:  Recent Labs  07/08/16 0800 07/08/16 0812 07/08/16 1324 07/08/16 1827 07/09/16 0204  HGB 14.1 14.3 13.6  --   --   HCT 41.1 42.0 41.3  --   --   PLT 297  --  302  --   --   APTT 31  --   --   --   --   LABPROT 13.0  --   --   --   --   INR 0.98  --   --   --   --   CREATININE 0.83 0.80 0.93  --  0.87  TROPONINI <0.03  --  0.04* 0.04* 0.09*    Estimated Creatinine Clearance: 45.9 mL/min (by C-G formula based on SCr of 0.87 mg/dL).   Medical History: Past Medical History:  Diagnosis Date  . Arthritis   . CAD (coronary artery disease) 03/2009   s/p MI  . Chronic combined systolic and diastolic heart failure ([place]) 06/17/2009   Qualifier: Diagnosis of  By: Burt Knack, MD, Clayburn Pert   . CKD (chronic kidney disease) stage 3, GFR 30-59 ml/min   . Emphysema/COPD ([place])   . Ex-smoker quit 2008  . GERD (gastroesophageal reflux disease)   . HCAP (healthcare-associated pneumonia) 03/19/2016  . Helicobacter pylori gastritis 10/2007   treated  . History of CVA (cerebrovascular accident) without residual deficits 01/2012, 06/2012   R hemorrhagic MCA 01/2012 with remote lacunar infarct L putamen and IC, rpt 06/2012 acute multifocal R MCA infarct with remote hemorrhagic strokes affecting L basal ganglia and periventricular white matter, full recovery  . HLD (hyperlipidemia)   . HTN (hypertension)   . Ischemic cardiomyopathy  02/27/2014  . Lung cancer ([place]) dx'd 07/2006   Lung CA, s/p resection, followed by Dr Earlie Server  . Macular degeneration   . Myocardial infarction 03/2009   Acute myocardial infarction 2010 - treated with BMS of LCx. LVEF 50% with subsequent CHF  . Osteoporosis 09/2013   T -3.6 forearm 09/2013, T -4.5 forearm 04/2015  . Stroke [facility])     Assessment: 75 yo female presented 12/13 with CP.  Initial plan was no intervention.  However CP reccured 12/14 and troponin is trending up.  Plan to initiate heparin and cardiac cath this afternoon.  Pt did receive heparin 5000 units SQ at 0630 today for VTE prophylaxis.  Will give reduced bolus dose.  Goal of Therapy:  Heparin level 0.3-0.7 units/ml Monitor platelets by anticoagulation protocol: Yes   Plan:  -Heparin bolus 2000 units IV followed by 600 units/hr (~12 units/kg/hr) -Will not order follow-up levels at this time as cath is tentatively scheduled for 1250 today.  [employer], Pharm.D., BCPS Clinical Pharmacist Pager [phone] 07/09/2016 10:19 AM

## 2016-07-09 NOTE — Progress Notes (Signed)
Initial Nutrition Assessment  DOCUMENTATION CODES:   Underweight, Severe malnutrition in context of chronic illness  INTERVENTION:    Boost Plus PO TID, each supplement provides 360 kcal and 14 gm protein  NUTRITION DIAGNOSIS:   Malnutrition related to chronic illness as evidenced by severe depletion of muscle mass, severe depletion of body fat.  GOAL:   Patient will meet greater than or equal to 90% of their needs  MONITOR:   PO intake, Supplement acceptance, Weight trends, Labs, I & O's  REASON FOR ASSESSMENT:   Malnutrition Screening Tool    ASSESSMENT:   75 yo female with PMH of CAD, HTN, HLD, GERD, CVA, chronic combined HF who presented to the ED with reports of left sided chest/arm pain, was initially called a STEMI but canceled.  Wt Readings from Last 25 Encounters:  07/09/16 114 lb 11.2 oz (52 kg)  06/29/16 114 lb 8 oz (51.9 kg)  06/22/16 118 lb 12.8 oz (53.9 kg)  06/06/16 117 lb (53.1 kg)  05/08/16 117 lb (53.1 kg)  04/28/16 118 lb (53.5 kg)  04/24/16 115 lb (52.2 kg)  04/21/16 118 lb 6.4 oz (53.7 kg)  04/14/16 115 lb (52.2 kg)  04/13/16 116 lb 6.4 oz (52.8 kg)  04/08/16 114 lb (51.7 kg)  04/06/16 115 lb 11.2 oz (52.5 kg)  04/01/16 114 lb 12.8 oz (52.1 kg)  03/31/16 113 lb (51.3 kg)  03/23/16 116 lb 12.8 oz (53 kg)  03/21/16 119 lb 4.8 oz (54.1 kg)  03/16/16 115 lb 4 oz (52.3 kg)  03/12/16 114 lb (51.7 kg)  03/11/16 111 lb 6.4 oz (50.5 kg)  03/09/16 115 lb 6.4 oz (52.3 kg)  03/02/16 114 lb 12 oz (52.1 kg)  02/26/16 112 lb 14.4 oz (51.2 kg)  02/07/16 116 lb 2 oz (52.7 kg)  01/08/16 119 lb (54 kg)  12/10/15 119 lb (54 kg)   Patient reports that she weighed 140 lbs in July. From review of usual weights above, patient has not lost a significant amount of weight in the past 6-7 months. She is chronically underweight with BMI=18.1. Nutrition-Focused physical exam completed. Findings are severe fat depletion, severe muscle depletion, and no edema.   Patient enjoys chocolate Boost supplements, drinks 2 per day at home. Labs reviewed. Medications reviewed and include Lasix, Spironolactone.  Diet Order:  Diet NPO time specified  Skin:  Reviewed, no issues  Last BM:  12/12  Height:   Ht Readings from Last 1 Encounters:  07/08/16 '5\' 6"'$  (1.676 m)    Weight:   Wt Readings from Last 1 Encounters:  07/09/16 114 lb 11.2 oz (52 kg)    Ideal Body Weight:  59.1 kg  BMI:  Body mass index is 18.51 kg/m.  Estimated Nutritional Needs:   Kcal:  1600-1800  Protein:  75-85 gm  Fluid:  1.6 L  EDUCATION NEEDS:   Education needs addressed  Molli Barrows, [place], [place], [place] Pager [phone] After Hours Pager [phone]

## 2016-07-09 NOTE — Progress Notes (Signed)
Site area: right groin  Site Prior to Removal:  Level 0  Pressure Applied For 20 MINUTES    Minutes Beginning at 1720  Manual:   Yes.    Patient Status During Pull:  stable  Post Pull Groin Site:  Level 0  Post Pull Instructions Given:  Yes.    Post Pull Pulses Present:  Yes.    Dressing Applied:  Yes.    Comments:  Checked at 37 with no change in assessment

## 2016-07-10 ENCOUNTER — Encounter (HOSPITAL_COMMUNITY): Payer: Self-pay | Admitting: Cardiology

## 2016-07-10 ENCOUNTER — Telehealth: Payer: Self-pay | Admitting: Physician Assistant

## 2016-07-10 LAB — BASIC METABOLIC PANEL
Anion gap: 7 (ref 5–15)
BUN: 11 mg/dL (ref 6–20)
CALCIUM: 8.4 mg/dL — AB (ref 8.9–10.3)
CO2: 26 mmol/L (ref 22–32)
CREATININE: 0.76 mg/dL (ref 0.44–1.00)
Chloride: 106 mmol/L (ref 101–111)
GFR calc non Af Amer: >60 mL/min (ref >60)
GLUCOSE: 96 mg/dL (ref 65–99)
Potassium: 3.9 mmol/L (ref 3.5–5.1)
Sodium: 139 mmol/L (ref 135–145)

## 2016-07-10 LAB — CBC
HCT: 36.5 % (ref 36.0–46.0)
Hemoglobin: 12.1 g/dL (ref 12.0–15.0)
MCH: 30.4 pg (ref 26.0–34.0)
MCHC: 33.2 g/dL (ref 30.0–36.0)
MCV: 91.7 fL (ref 78.0–100.0)
PLATELETS: 255 10*3/uL (ref 150–400)
RBC: 3.98 MIL/uL (ref 3.87–5.11)
RDW: 13.2 % (ref 11.5–15.5)
WBC: 7.9 10*3/uL (ref 4.0–10.5)

## 2016-07-10 MED ORDER — LOSARTAN POTASSIUM 25 MG PO TABS: 12.5000 mg | ORAL_TABLET | Freq: Every day | ORAL | 6 refills | Status: DC

## 2016-07-10 MED ORDER — LOSARTAN POTASSIUM 25 MG PO TABS
12.5000 mg | ORAL_TABLET | Freq: Every day | ORAL | Status: DC
  Administered 2016-07-10: 12.5 mg via ORAL
  Filled 2016-07-10: qty 0.5

## 2016-07-10 MED FILL — Nitroglycerin IV Soln 100 MCG/ML in D5W: INTRA_ARTERIAL | Qty: 10 | Status: AC

## 2016-07-10 NOTE — Care Management Note (Signed)
Case Management Note  Patient Details  Name: [name] MRN: [id number] Date of Birth: 10/05/1940  Subjective/Objective:     S/p coronary stent intervention, will be on plavix, NCM will follow for dc needs.               Action/Plan:   Expected Discharge Date:                  Expected Discharge Plan:  Home/Self Care  In-House Referral:     Discharge planning Services  CM Consult  Post Acute Care Choice:    Choice offered to:     DME Arranged:    DME Agency:     HH Arranged:    HH Agency:     Status of Service:  Completed, signed off  If discussed at [employer] of Stay Meetings, dates discussed:    Additional Comments:  Zenon Mayo, RN 07/10/2016, 10:14 AM

## 2016-07-10 NOTE — Telephone Encounter (Signed)
New message      TCM appt on 07-17-16 with Vin----per Vin.

## 2016-07-10 NOTE — Progress Notes (Signed)
Patient Name: [name] Date of Encounter: 07/10/2016  Primary Cardiologist: Dr. Tyrell Antonio Problem List     Active Problems:   Chest pain with moderate risk of acute coronary syndrome   NSTEMI (non-ST elevated myocardial infarction) (HCC)    Subjective   Feeling well. No chest pain, sob or palpitations.   Inpatient Medications    Scheduled Meds: . aspirin EC  81 mg Oral Daily  . budesonide  0.5 mg Nebulization BID  . clopidogrel  75 mg Oral Daily  . enoxaparin (LOVENOX) injection  40 mg Subcutaneous Q24H  . feeding supplement (ENSURE ENLIVE)  237 mL Oral BID BM  . furosemide  20 mg Oral Daily  . ipratropium-albuterol  3 mL Nebulization BID  . isosorbide mononitrate  30 mg Oral Daily  . pantoprazole  40 mg Oral Daily  . simvastatin  40 mg Oral QHS  . sodium chloride flush  3 mL Intravenous Q12H  . spironolactone  12.5 mg Oral Daily   Continuous Infusions:  PRN Meds: sodium chloride, acetaminophen, albuterol, nitroGLYCERIN, nitroGLYCERIN, ondansetron (ZOFRAN) IV, sodium chloride flush   Vital Signs    Vitals:   07/09/16 1918 07/09/16 2000 07/10/16 0421 07/10/16 0821  BP: (!) 117/50  (!) 120/99 114/63  Pulse: 72 (!) 53 73 76  Resp: (!) '23 16 20 20  '$ Temp: 97.5 F (36.4 C)  97.1 F (36.2 C) 97.7 F (36.5 C)  TempSrc: Oral  Axillary Oral  SpO2: 94% 95% 97% 97%  Weight:   121 lb 14.6 oz (55.3 kg)   Height:        Intake/Output Summary (Last 24 hours) at 07/10/16 1024 Last data filed at 07/10/16 0100  Gross per 24 hour  Intake            989.6 ml  Output              650 ml  Net            339.6 ml   Filed Weights   07/08/16 1309 07/09/16 0235 07/10/16 0421  Weight: 111 lb 9.6 oz (50.6 kg) 114 lb 11.2 oz (52 kg) 121 lb 14.6 oz (55.3 kg)    Physical Exam   GEN: Well nourished, well developed, in no acute distress.  HEENT: Grossly normal.  Neck: Supple, no JVD, carotid bruits, or masses. Cardiac: RRR, no murmurs, rubs, or gallops. No  clubbing, cyanosis, edema.  Radials/DP/PT 2+ and equal bilaterally. L groin without hematoma.  Respiratory:  Respirations regular and unlabored, clear to auscultation bilaterally. GI: Soft, nontender, nondistended, BS + x 4. MS: no deformity or atrophy. Skin: warm and dry, no rash. Neuro:  Strength and sensation are intact. Psych: AAOx3.  Normal affect.  Labs    CBC  Recent Labs  07/08/16 0800  07/08/16 1324 07/10/16 0251  WBC 8.3  --  7.3 7.9  NEUTROABS 5.0  --   --   --   HGB 14.1  < > 13.6 12.1  HCT 41.1  < > 41.3 36.5  MCV 91.9  --  93.4 91.7  PLT 297  --  302 255  < > = values in this interval not displayed. Basic Metabolic Panel  Recent Labs  07/09/16 0204 07/10/16 0251  NA 138 139  K 4.0 3.9  CL 102 106  CO2 28 26  GLUCOSE 95 96  BUN 13 11  CREATININE 0.87 0.76  CALCIUM 8.6* 8.4*   Liver Function Tests  Recent Labs  07/08/16 0800  AST 22  ALT 21  ALKPHOS 67  BILITOT 0.7  PROT 6.7  ALBUMIN 3.6   No results for input(s): LIPASE, AMYLASE in the last 72 hours. Cardiac Enzymes  Recent Labs  07/08/16 1324 07/08/16 1827 07/09/16 0204  TROPONINI 0.04* 0.04* 0.09*   BNP Invalid input(s): POCBNP D-Dimer No results for input(s): DDIMER in the last 72 hours. Hemoglobin A1C No results for input(s): HGBA1C in the last 72 hours. Fasting Lipid Panel  Recent Labs  07/08/16 0800  CHOL 139  HDL 65  LDLCALC 60  TRIG 71  CHOLHDL 2.1   Thyroid Function Tests No results for input(s): TSH, T4TOTAL, T3FREE, THYROIDAB in the last 72 hours.  Invalid input(s): FREET3  Telemetry    Sinus rhythm with PVCs and ventricular trigemny - Personally Reviewed  ECG    N/A  Radiology    No results found.  Cardiac Studies   Coronary Stent Intervention  Left Heart Cath and Coronary Angiography  Conclusion     There is severe left ventricular systolic dysfunction.  LV end diastolic pressure is normal.  The left ventricular ejection fraction is  25-35% by visual estimate.  Prox Cx to Mid Cx lesion, 10 %stenosed.  Dist LAD lesion, 100 %stenosed.  Ost 1st Diag lesion, 80 %stenosed.  Prox LAD lesion, 75 %stenosed.  A STENT PROMUS PREM MR 3.0X8 drug eluting stent was successfully placed.  Post intervention, there is a 0% residual stenosis.   1. Single vessel obstructive CAD. The stent in the LCx is still patent. There is occlusion of the very distal LAD that is old. There is a hazy ruptured plaque in the mid LAD that is felt to be the culprit lesion. The first diagonal has a moderate ostial stenosis 2. Severe LV dysfunction with normal LVEDP 3. Successful stenting of the mid LAD with DES.  Plan: continue DAPT, medical management of residual disease.    Echo 07/08/16 LV EF: 30% -   35%  ------------------------------------------------------------------- Indications:      Chest pain 786.51.  ------------------------------------------------------------------- History:   PMH:  Emphysema. Ischemic cardiomyopathy.  Coronary artery disease.  Congestive heart failure.  Stroke.  PMH: Myocardial infarction.  Risk factors:  Hypertension. Dyslipidemia.   ------------------------------------------------------------------- Study Conclusions  - Left ventricle: Septal inferior and inferior lateral wall   hypokinesis The cavity size was moderately dilated. Wall   thickness was increased in a pattern of mild LVH. Systolic   function was moderately to severely reduced. The estimated   ejection fraction was in the range of 30% to 35%. - Aortic valve: There was mild regurgitation. - Mitral valve: There was moderate regurgitation. - Left atrium: The atrium was mildly dilated. - Right atrium: The atrium was mildly dilated. - Atrial septum: No defect or patent foramen ovale was identified. - Tricuspid valve: There was moderate regurgitation. - Pulmonary arteries: PA peak pressure: 39 mm Hg (S).   Patient Profile     75 yo female  with PMH of CAD, HTN, HLD, GERD, CVA, chronic combined HF who presented to the ED with reports of left sided chest/arm pain, was initially called a STEMI but canceled.   Assessment & Plan   1. NSTEMI - Cath showed 75% pro LAD with hazy ruptured plaque s/p PTCA and DES. 100% chronic occluded dist LADl 80% ost 1st diag. Patent Lcx stent, 10% mid Lcx. EF of 25-35%. Continue DAPT and medical management of residual disease.   2. ICM/ acute on chronic systolic CHF - EF  this admission showed 30-35%. Normal LVEDP by cath. Net I & O was 684.7 during admission. She is euvolemic.  - Continue lasix, spironolactone. She has not been able to tolerate beta blockers. SCr normal. Will add Losartan 12.'5mg'$  qd. Hx of cough on lisinopril. She is coughing due to COPD, she will f/u with PCP.  - After discussion with Dr. Burt Knack changed her code status to DNR.   3. HLD - Continue statin  Jarrett Soho, PA  07/10/2016, 10:24 AM   See DC summary Sherren Mocha 07/10/2016 3:56 PM

## 2016-07-10 NOTE — Progress Notes (Signed)
CARDIAC REHAB PHASE I   PRE:  Rate/Rhythm: 73 trigeminy  BP:  Sitting: 114/63        SaO2: 96 RA  MODE:  Ambulation: 350 ft   POST:  Rate/Rhythm: 93 trigeminy  BP:  Sitting: 132/50         SaO2: 97 RA  Pt ambulated 350 ft on RA, handheld assist, steady gait, tolerated well.  Pt c/o mild DOE (states this is her baseline), denies cp, dizziness, declined rest stop. Completed MI/stent education.  Reviewed risk factors, MI/stent book, anti-platelet therapy, stent card, activity restrictions, ntg, exercise, CHF booklet and zone tool, daily weights, sodium restrictions, heart healthy diet, and phase 2 cardiac rehab. Pt verbalized understanding, able to perform teach back. Pt agrees to phase 2 cardiac rehab referral, will send to [facility] per pt request. Pt states she would like to change her code status to DNR and would like to update her living will, RN notified. Pt to recliner after walk, call bell within reach.   [place], RN, BSN 07/10/2016 9:05 AM

## 2016-07-10 NOTE — Discharge Summary (Signed)
Discharge Summary    Patient ID: [name],  MRN: [id number], DOB/AGE: August 13, 1940 75 y.o.  Admit date: 07/08/2016 Discharge date: 07/10/2016  Primary Care Provider: Ria Bush Primary Cardiologist: Dr. Burt Knack  Discharge Diagnoses    Active Problems:   Chest pain with moderate risk of acute coronary syndrome   NSTEMI (non-ST elevated myocardial infarction) ([place])   Recovering from CAP  Allergies Allergies  Allergen Reactions  . Actonel [Risedronate Sodium] Other (See Comments)    Headache  . Amlodipine Other (See Comments)    Pedal edema  . Fosamax [Alendronate Sodium] Other (See Comments)    Unable to tolerate  . Lisinopril Cough  . Pravastatin Other (See Comments)    Constipation.  . Codeine Rash    Diagnostic Studies/Procedures    Coronary Stent Intervention  Left Heart Cath and Coronary Angiography  Conclusion     There is severe left ventricular systolic dysfunction.  LV end diastolic pressure is normal.  The left ventricular ejection fraction is 25-35% by visual estimate.  Prox Cx to Mid Cx lesion, 10 %stenosed.  Dist LAD lesion, 100 %stenosed.  Ost 1st Diag lesion, 80 %stenosed.  Prox LAD lesion, 75 %stenosed.  A STENT PROMUS PREM MR 3.0X8 drug eluting stent was successfully placed.  Post intervention, there is a 0% residual stenosis.  1. Single vessel obstructive CAD. The stent in the LCx is still patent. There is occlusion of the very distal LAD that is old. There is a hazy ruptured plaque in the mid LAD that is felt to be the culprit lesion. The first diagonal has a moderate ostial stenosis 2. Severe LV dysfunction with normal LVEDP 3. Successful stenting of the mid LAD with DES.  Plan: continue DAPT, medical management of residual disease.    Echo 07/08/16 LV EF: 30% - 35%  ------------------------------------------------------------------- Indications: Chest pain  786.51.  ------------------------------------------------------------------- History: PMH: Emphysema. Ischemic cardiomyopathy. Coronary artery disease. Congestive heart failure. Stroke. PMH: Myocardial infarction. Risk factors: Hypertension. Dyslipidemia.  ------------------------------------------------------------------- Study Conclusions  - Left ventricle: Septal inferior and inferior lateral wall hypokinesis The cavity size was moderately dilated. Wall thickness was increased in a pattern of mild LVH. Systolic function was moderately to severely reduced. The estimated ejection fraction was in the range of 30% to 35%. - Aortic valve: There was mild regurgitation. - Mitral valve: There was moderate regurgitation. - Left atrium: The atrium was mildly dilated. - Right atrium: The atrium was mildly dilated. - Atrial septum: No defect or patent foramen ovale was identified. - Tricuspid valve: There was moderate regurgitation. - Pulmonary arteries: PA peak pressure: 39 mm Hg (S).    History of Present Illness     75 yo female with PMH of CAD, HTN, HLD, GERD, CVA, chronic combined HF and COPD/Lung [place] presented to the ED with reports of left sided chest/arm pain, was initially called a STEMI but canceled.   She initially presented with an acute MI in 2010 with LCx occlusion tx with primary PCI with BMS. She also has a hx of R MCA strokein 2013, non-small cell lung CA s/p resection, COPD. She presented in 7/17 with an anterior STEMI and was found to have an apical occlusion of the LAD. This was treated medically. She was admitted in 9/17 with chest discomfort. Cardiac markers were normal. She was seen by Dr. Tamala Julian, who did her cardiac catheterization in7/17. No further workup was recommended. Medical therapy was continued.   She was seen in the office by Dr.  Lameshia Hypolite in 9/17, and was at her baseline. Unable to tolerate BB or ACEi with her lung disease. Seen by  Richardson Dopp on 06/22/16, continued on medical therapy with ASA, Plavix, nitrates, and statin. BNP at that visit was slightly elevated.   Reports she saw her PCP recently and dx with PNA. Placed on Zpac for 5 days, but continued to have productive rust colored sputum. Called back and given additional 5 days of antibiotics. She has also been experiencing palpitations, and the feeling of her heart racing over the past couple of weeks. Called the office and planned to have a monitor placed. Around 830am 07/07/16 she developed left sided chest pain with radiation down into her left upper arm, forearm and hand. Lasted about 2 hours then resolved. States she was then awoken from sleep about 5am this morning with the same chest pain. Again had aching, throbbing pain into her left arm and hand. States this time her hand became numb. Did have the sensation of palpitations this morning. This lasted about an hours prompting her to call EMS. States she was dyspneic, dizzy and nauseated. She was given SL nitro with relief of chest pain, but arm still hurt. EKG done by EMS and Code STEMI called. EKG on arrival showed SR with ST changes in the anterior leads, with LVH that is different from previous EKGs. On arrival was pain free. Second EKG showed improved ST changes. She was seen by Dr. Angelena Form in the ED and STEMI cancelled. Cardiology to admit for further work up.   Hospital Course     Consultants: None  1. NSTEMI The patient was admitted for r/o. Troponin trend 0.04-->0.04--> 0.09. The patient had another episode of left arm pain overnight. This lasted for 5 mins and then spontaneously resolved. With known CAD, recurrent symptoms, and progressive LV systolic dysfunction, recommend cardiac catheterization and possible PCI. Started on IV heparin. Cath showed 75% pro LAD with hazy ruptured plaque s/p PTCA and DES. 100% chronic occluded dist LADl 80% ost 1st diag. Patent Lcx stent, 10% mid Lcx. EF of 25-35%. Continue  DAPT and medical management of residual disease   2. ICM/ Acute on chronic systolic CHF - BNP was 010. EF this admission showed 30-35%. Normal LVEDP by cath. Net I & O was 684.7 during admission. She is euvolemic.  - Continue lasix, spironolactone. She has not been able to tolerate beta blockers. SCr normal. Added Losartan 12.'5mg'$  qd. Hx of cough on lisinopril. She is coughing due to COPD.lung ca and recently treated for CAP, she will f/u with PCP.  - After discussion with Dr. Burt Knack changed her code status to DNR.   3. HLD - Continue statin  The patient has been seen by Dr. Burt Knack  today and deemed ready for discharge home. All follow-up appointments have been scheduled. Discharge medications are listed below.    Discharge Vitals Blood pressure 114/63, pulse 76, temperature 97.7 F (36.5 C), temperature source Oral, resp. rate 20, height '5\' 6"'$  (1.676 m), weight 121 lb 14.6 oz (55.3 kg), SpO2 97 %.  Filed Weights   07/08/16 1309 07/09/16 0235 07/10/16 0421  Weight: 111 lb 9.6 oz (50.6 kg) 114 lb 11.2 oz (52 kg) 121 lb 14.6 oz (55.3 kg)    Labs & Radiologic Studies     CBC  Recent Labs  07/08/16 0800  07/08/16 1324 07/10/16 0251  WBC 8.3  --  7.3 7.9  NEUTROABS 5.0  --   --   --   HGB  14.1  < > 13.6 12.1  HCT 41.1  < > 41.3 36.5  MCV 91.9  --  93.4 91.7  PLT 297  --  302 255  < > = values in this interval not displayed. Basic Metabolic Panel  Recent Labs  07/09/16 0204 07/10/16 0251  NA 138 139  K 4.0 3.9  CL 102 106  CO2 28 26  GLUCOSE 95 96  BUN 13 11  CREATININE 0.87 0.76  CALCIUM 8.6* 8.4*   Liver Function Tests  Recent Labs  07/08/16 0800  AST 22  ALT 21  ALKPHOS 67  BILITOT 0.7  PROT 6.7  ALBUMIN 3.6   No results for input(s): LIPASE, AMYLASE in the last 72 hours. Cardiac Enzymes  Recent Labs  07/08/16 1324 07/08/16 1827 07/09/16 0204  TROPONINI 0.04* 0.04* 0.09*   BNP Invalid input(s): POCBNP D-Dimer No results for input(s): DDIMER in  the last 72 hours. Hemoglobin A1C No results for input(s): HGBA1C in the last 72 hours. Fasting Lipid Panel  Recent Labs  07/08/16 0800  CHOL 139  HDL 65  LDLCALC 60  TRIG 71  CHOLHDL 2.1   Thyroid Function Tests No results for input(s): TSH, T4TOTAL, T3FREE, THYROIDAB in the last 72 hours.  Invalid input(s): FREET3  Dg Chest 2 View  Result Date: 06/29/2016 CLINICAL DATA:  History of COPD. Productive cough for over week. History of lung cancer post resection. EXAM: CHEST  2 VIEW COMPARISON:  CT of the chest 06/22/2016 FINDINGS: Cardiomediastinal silhouette is normal. Mediastinal contours appear intact. There are nodular opacities in the right middle and right lower lobe. Right pleural reflection is stable. Postsurgical changes in the hilum from prior lobectomy. Osseous structures are without acute abnormality. Soft tissues are grossly normal. IMPRESSION: Scattered nodular airspace opacities in the right middle and right lower lobe. These may represent infectious consolidation, however soft tissue pulmonary masses cannot be excluded either. Electronically Signed   By: Fidela Salisbury M.D.   On: 06/29/2016 12:44   Ct Chest Wo Contrast  Result Date: 06/22/2016 CLINICAL DATA:  COPD.  Six-month follow-up EXAM: CT CHEST WITHOUT CONTRAST TECHNIQUE: Multidetector CT imaging of the chest was performed following the standard protocol without IV contrast. COMPARISON:  Chest CT 03/19/2016 FINDINGS: Cardiovascular: Coronary artery calcification and aortic atherosclerotic calcification. Mediastinum/Nodes: No axillary or supraclavicular lymphadenopathy. Subcentimeter mediastinal lymph nodes are not changed. Lungs/Pleura: New bilobed nodule in new RIGHT middle lobe with individual nodules measuring 12 and 10 mm (image 128, series 3). Nodule in the posterior aspect of the RIGHT lower lobe along the pleural surface measures 19 mm (image 122, series 3) reduced from 28 mm. RIGHT infrahilar nodular  thickening measuring 13 mm not changed from 13 mm (1, series 3). In LEFT lung, LEFT lower lobe nodule measuring 15 mm is unchanged. Extensive centrilobular emphysema the upper lobes Upper Abdomen: Limited view of the liver, kidneys, pancreas are unremarkable. Normal adrenal glands. Musculoskeletal: No aggressive osseous lesion IMPRESSION: 1. Waxing and waning pulmonary nodules with new 2 new pulmonary nodules in the RIGHT middle lobe. Findings suggest infectious or inflammatory process. In patient with smoking history, Recommend follow-up CT in 3-6 months. 2. Stable mediastinal adenopathy is likely reactive. Electronically Signed   By: Suzy Bouchard M.D.   On: 06/22/2016 16:16   Dg Chest Portable 1 View  Result Date: 07/08/2016 CLINICAL DATA:  Chest pain EXAM: PORTABLE CHEST 1 VIEW COMPARISON:  June 29, 2016 chest radiograph and chest CT June 22, 2016 FINDINGS: Nodular opacities remain  in the right base without appreciable change compared to recent studies. No new opacities are evident. Lungs appear somewhat hyperexpanded. Heart size and pulmonary vascularity are normal. There is atherosclerotic calcification aorta. No adenopathy. No bone lesions. No pneumothorax. IMPRESSION: Nodular opacities persist in the right base. Etiology uncertain; please see recommendations from recent chest CT report. No new opacity. Lungs somewhat hyperexpanded. Stable cardiac silhouette. There is aortic atherosclerosis. Electronically Signed   By: Lowella Grip III M.D.   On: 07/08/2016 08:33    Disposition   Pt is being discharged home today in good condition.  Follow-up Plans & Appointments    Follow-up Information    [place], [place]. Go on 07/17/2016.   Specialty:  Cardiology Why:  '@10am'$  for TCM, please arrive 15 minutes early Contact information: [address] [place] [address] [phone]        Ria Bush, MD. Schedule an appointment as soon as possible for a  visit in 3 day(s).   Specialty:  Family Medicine Why:  for post hospital and cough - still recevering for pneumonia Contact information: [place] [address] [phone]          Discharge Instructions    AMB Referral to Cardiac Rehabilitation - Phase II    Complete by:  As directed    Diagnosis:  NSTEMI   Amb Referral to Cardiac Rehabilitation    Complete by:  As directed    Diagnosis:   NSTEMI Coronary Stents     Diet - low sodium heart healthy    Complete by:  As directed    Discharge instructions    Complete by:  As directed    NO HEAVY LIFTING (>10lbs) X 2 WEEKS. NO SEXUAL ACTIVITY X 2 WEEKS. NO DRIVING X 1 WEEK. NO SOAKING BATHS, HOT TUBS, POOLS, ETC., X 7 DAYS.   Increase activity slowly    Complete by:  As directed       Discharge Medications   Current Discharge Medication List    START taking these medications   Details  losartan (COZAAR) 25 MG tablet Take 0.5 tablets (12.5 mg total) by mouth daily. Qty: 30 tablet, Refills: 6      CONTINUE these medications which have NOT CHANGED   Details  albuterol (PROAIR HFA) 108 (90 Base) MCG/ACT inhaler Inhale 2 puffs into the lungs every 4 (four) hours as needed for wheezing or shortness of breath. Qty: 1 Inhaler, Refills: 1    aspirin EC 81 MG tablet Take 1 tablet (81 mg total) by mouth daily.    budesonide (PULMICORT) 0.5 MG/2ML nebulizer solution Take 2 mLs (0.5 mg total) by nebulization 2 (two) times daily. Qty: 120 mL, Refills: 5   Associated Diagnoses: COPD exacerbation (HCC)    Calcium Carbonate-Vitamin D (CALCIUM-VITAMIN D) 500-200 MG-UNIT per tablet Take 1 tablet by mouth 2 (two) times daily.     Cholecalciferol (VITAMIN D) 2000 UNITS CAPS Take 1 capsule by mouth daily.    clopidogrel (PLAVIX) 75 MG tablet Take 1 tablet (75 mg total) by mouth daily. Qty: 30 tablet, Refills: 12    furosemide (LASIX) 20 MG tablet Take 2 tablets by mouth daily on Monday, Wednesday and Friday  and one tablet by mouth daily all other days Qty: 90 tablet, Refills: 3   Associated Diagnoses: Chronic combined systolic and diastolic heart failure (HCC)    ipratropium-albuterol (DUONEB) 0.5-2.5 (3) MG/3ML SOLN Take 3 mLs by nebulization 2 (two) times daily.  isosorbide mononitrate (IMDUR) 30 MG 24 hr tablet TAKE 1 TABLET BY MOUTH DAILY Qty: 30 tablet, Refills: 10    LOTEMAX 0.5 % ophthalmic suspension Place 1 drop into both eyes daily.     nitroGLYCERIN (NITROSTAT) 0.4 MG SL tablet Place 1 tablet (0.4 mg total) under the tongue every 5 (five) minutes as needed for chest pain. Qty: 25 tablet, Refills: 3   Associated Diagnoses: Atherosclerosis of native coronary artery of native heart without angina pectoris    pantoprazole (PROTONIX) 40 MG tablet Take 1 tablet (40 mg total) by mouth daily. Qty: 30 tablet, Refills: 11    simvastatin (ZOCOR) 40 MG tablet TAKE ONE TABLET BY MOUTH EVERY NIGHT AT BEDTIME Qty: 30 tablet, Refills: 6    spironolactone (ALDACTONE) 25 MG tablet TAKE 1/2 TABLET BY MOUTH ONCE DAILY Qty: 30 tablet, Refills: 11    denosumab (PROLIA) 60 MG/ML SOLN injection Inject 60 mg into the skin every 6 (six) months. Administer in upper arm, thigh, or abdomen Qty: 60 mL, Refills: 1      STOP taking these medications     azithromycin (ZITHROMAX Z-PAK) 250 MG tablet          Aspirin prescribed at discharge?  Yes High Intensity Statin Prescribed? (Lipitor 40-'80mg'$  or Crestor 20-'40mg'$ ): Yes Beta Blocker Prescribed? Yes For EF 45% or less, Was ACEI/ARB Prescribed? Yes ADP Receptor Inhibitor Prescribed? (i.e. Plavix etc.-Includes Medically Managed Patients): Yes For EF <40%, Aldosterone Inhibitor Prescribed? Yes Was EF assessed during THIS hospitalization? Yes Was Cardiac Rehab II ordered? (Included Medically managed Patients): Yes   Outstanding Labs/Studies   BMET during TCM  Duration of Discharge Encounter   Greater than 30 minutes including physician  time.  Signed, Bhagat,Bhavinkumar PA-C 07/10/2016, 10:59 AM  Patient seen, examined. Available data reviewed. Agree with findings, assessment, and plan as outlined by Robbie Lis, PA-C. The patient is independently interviewed and examined. Exam reveals a frail, elderly woman in no distress. JVP is normal. Lungs with diffuse rhonchi. Heart is regular rate and rhythm. Right groin site is clear. There is no peripheral edema.  I reviewed the patient's cardiac catheterization films from yesterday. She underwent successful PCI of the mid LAD. She's had no recurrent left arm discomfort. I think she is stable for hospital discharge. Her medical regimen is reviewed and will be continued with the addition of losartan 12.5 mg at bedtime to treat her chronic systolic heart failure. Her LVEDP was only 5 mmHg by cardiac catheterization. I don't think we can push her diuretics much further. She will continue on a low-dose of furosemide and Aldactone. She will continue on long-term dual antiplatelet therapy with aspirin and Plavix.  Sherren Mocha, M.D. 07/10/2016 11:29 AM

## 2016-07-11 ENCOUNTER — Encounter: Payer: Self-pay | Admitting: Family Medicine

## 2016-07-13 ENCOUNTER — Other Ambulatory Visit: Payer: PPO

## 2016-07-13 ENCOUNTER — Ambulatory Visit: Payer: PPO

## 2016-07-13 NOTE — Telephone Encounter (Signed)
Patient contacted regarding discharge from [facility] on 07/10/16.  Patient understands to follow up with provider Ferol Luz, PA on Friday 12/22 at 10:00 am at [facility]. Patient understands discharge instructions? Yes Patient understands medications and regiment? Yes Patient understands to bring all medications to this visit? Yes  Patient states everything was explained to her so that she could easily understand and she is very [employer] attorney.  She thanked me for the call.

## 2016-07-15 ENCOUNTER — Encounter: Payer: PPO | Admitting: Family Medicine

## 2016-07-16 NOTE — Progress Notes (Signed)
Cardiology Office Note    Date:  07/17/2016   ID:  [name], DOB 1941-06-27, MRN [id number]  PCP:  Ria Bush, MD  Cardiologist:  Dr. Burt Knack  Chief Complaint: Hospital follow up for NSTEMI  History of Present Illness:   [name] is a 75 y.o. female CAD, HTN, HLD, GERD, CVA, chronic combined HF, COPD/Lung Ca and recent admission for NSTEMI  who presented for follow up.   She initially presented with an acute MI in 2010 with LCx occlusion tx with primary PCI with BMS. She also has a hx of R MCA strokein 2013, non-small cell lung CA s/p resection, COPD. She presented in 7/17 with an anterior STEMI and was found to have an apical occlusion of the LAD. This was treated medically. She was admitted in 9/17 with chest discomfort. Cardiac markers were normal. She was seen by Dr. Tamala Julian, who did her cardiac catheterization in7/17. No further workup was recommended. Medical therapy was continued. Unable to tolerate BB or ACEi with her lung disease.  Admitted 07/08/16-07/10/16 for NSTEMI. Troponin trend 0.04-->0.04--> 0.09. The patient had another episode of left arm pain overnight. This lasted for 5 mins and then spontaneously resolved. Another episode of cp during admission, started on IV heparin.  Cath showed 75% pro LAD with hazy ruptured plaque s/p PTCA and DES. 100% chronic occluded dist LADl 80% ost 1st diag. Patent Lcx stent, 10% mid Lcx. EF of 25-35%. Continue DAPT and medical management of residual disease. BNP was 528. EF this admission showed 30-35%. Normal LVEDP by cath. Net I &O was 684.7 during admission. Added Losartan 12.'5mg'$  qd. Hx of cough on lisinopril. She is coughing due to COPD.lung ca and recently treated for CAP,  Here today for follow up. No further chest pain or dyspnea. Still coughing with brown phlegm. She does not want to f/u with PCP. Has appointment with pulmonologist 08/01/15. The patient denies nausea, vomiting, fever, chest pain, palpitations,  shortness of breath, orthopnea, PND, dizziness, syncope, abdominal pain, hematochezia, melena, lower extremity edema.   Past Medical History:  Diagnosis Date  . Arthritis   . CAD (coronary artery disease) 03/2009   s/p MI  . Chronic combined systolic and diastolic heart failure ([place]) 06/17/2009   Qualifier: Diagnosis of  By: Burt Knack, MD, [place] Pert   . CKD (chronic kidney disease) stage 3, GFR 30-59 ml/min   . Emphysema/COPD ([place])   . Ex-smoker quit 2008  . GERD (gastroesophageal reflux disease)   . HCAP (healthcare-associated pneumonia) 03/19/2016  . Helicobacter pylori gastritis 10/2007   treated  . History of CVA (cerebrovascular accident) without residual deficits 01/2012, 06/2012   R hemorrhagic MCA 01/2012 with remote lacunar infarct L putamen and IC, rpt 06/2012 acute multifocal R MCA infarct with remote hemorrhagic strokes affecting L basal ganglia and periventricular white matter, full recovery  . HLD (hyperlipidemia)   . HTN (hypertension)   . Ischemic cardiomyopathy 02/27/2014  . Lung cancer ([place]) dx'd 07/2006   Lung CA, s/p resection, followed by Dr Earlie Server  . Macular degeneration   . Myocardial infarction 03/2009   Acute myocardial infarction 2010 - treated with BMS of LCx. LVEF 50% with subsequent CHF  . Osteoporosis 09/2013   T -3.6 forearm 09/2013, T -4.5 forearm 04/2015  . Stroke [facility])     Past Surgical History:  Procedure Laterality Date  . BREAST BIOPSY Left   . CARDIAC CATHETERIZATION N/A 02/24/2016   Procedure: Left Heart Cath and Coronary Angiography;  Surgeon: Mallie Mussel  Nicholes Stairs, MD;  Location: [place] [facility];  Service: Cardiovascular;  Laterality: N/A;  . CARDIAC CATHETERIZATION N/A 07/09/2016   Procedure: Left Heart Cath and Coronary Angiography;  Surgeon: Peter M Martinique, MD;  Location: [place] [facility];  Service: Cardiovascular;  Laterality: N/A;  . CARDIAC CATHETERIZATION N/A 07/09/2016   Procedure: Coronary Stent Intervention;  Surgeon: Peter M Martinique, MD;   Location: [place] [facility];  Service: Cardiovascular;  Laterality: N/A;  . CATARACT EXTRACTION Bilateral   . CORONARY STENT PLACEMENT  06/2016   DES to mid LAD  . EYE SURGERY    . LUNG REMOVAL, PARTIAL Right 2008  . PARTIAL HYSTERECTOMY  1986   irregular periods, ovaries remain    Current Medications: Prior to Admission medications   Medication Sig Start Date End Date Taking? Authorizing Provider  albuterol (PROAIR HFA) 108 (90 Base) MCG/ACT inhaler Inhale 2 puffs into the lungs every 4 (four) hours as needed for wheezing or shortness of breath. 03/24/16   Jose Shirl Harris, MD  aspirin EC 81 MG tablet Take 1 tablet (81 mg total) by mouth daily. 02/26/16   Arbutus Leas, NP  budesonide (PULMICORT) 0.5 MG/2ML nebulizer solution Take 2 mLs (0.5 mg total) by nebulization 2 (two) times daily. 03/25/16   Collene Gobble, MD  Calcium Carbonate-Vitamin D (CALCIUM-VITAMIN D) 500-200 MG-UNIT per tablet Take 1 tablet by mouth 2 (two) times daily.     Historical Provider, MD  Cholecalciferol (VITAMIN D) 2000 UNITS CAPS Take 1 capsule by mouth daily.    Historical Provider, MD  clopidogrel (PLAVIX) 75 MG tablet Take 1 tablet (75 mg total) by mouth daily. 02/26/16   Arbutus Leas, NP  denosumab (PROLIA) 60 MG/ML SOLN injection Inject 60 mg into the skin every 6 (six) months. Administer in upper arm, thigh, or abdomen 06/13/15   Ria Bush, MD  furosemide (LASIX) 20 MG tablet Take 2 tablets by mouth daily on Monday, Wednesday and Friday and one tablet by mouth daily all other days 06/23/16   Sherren Mocha, MD  ipratropium-albuterol (DUONEB) 0.5-2.5 (3) MG/3ML SOLN Take 3 mLs by nebulization 2 (two) times daily.    Historical Provider, MD  isosorbide mononitrate (IMDUR) 30 MG 24 hr tablet TAKE 1 TABLET BY MOUTH DAILY 05/25/16   Sherren Mocha, MD  losartan (COZAAR) 25 MG tablet Take 0.5 tablets (12.5 mg total) by mouth daily. 07/10/16   Sevilla Murtagh, PA  LOTEMAX 0.5 % ophthalmic suspension Place 1  drop into both eyes daily.  12/27/13   Historical Provider, MD  nitroGLYCERIN (NITROSTAT) 0.4 MG SL tablet Place 1 tablet (0.4 mg total) under the tongue every 5 (five) minutes as needed for chest pain. 07/03/15   Liliane Shi, PA-C  pantoprazole (PROTONIX) 40 MG tablet Take 1 tablet (40 mg total) by mouth daily. 02/27/16   Sherren Mocha, MD  simvastatin (ZOCOR) 40 MG tablet TAKE ONE TABLET BY MOUTH EVERY NIGHT AT BEDTIME 06/08/16   Sherren Mocha, MD  spironolactone (ALDACTONE) 25 MG tablet TAKE 1/2 TABLET BY MOUTH ONCE DAILY 04/14/16   Sherren Mocha, MD    Allergies:   Actonel [risedronate sodium]; Amlodipine; Fosamax [alendronate sodium]; Lisinopril; Pravastatin; and Codeine   Social History   Social History  . Marital status: Widowed    Spouse name: N/A  . Number of children: 1  . Years of education: N/A   Occupational History  . retired     Insurance underwriter   Social History Main Topics  .  Smoking status: Former Smoker    Types: Cigarettes  . Smokeless tobacco: Never Used  . Alcohol use No  . Drug use: No  . Sexual activity: No   Other Topics Concern  . Not on file   Social History Narrative   The patient is a widow and has one son and one dog.     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  She does not drink alcohol. 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   Ed: HS   Activity: no regular exercise   Diet: some water, some fruits/vegetables    Lives in a one story home.      Family History:  The patient's family history includes Breast cancer in her sister; CAD (age of onset: 40) in her father and mother; Diabetes in her sister; Heart attack in her mother; Hypertension in her mother; Stroke in her father and mother.   ROS:   Please see the history of present illness.    ROS All other systems reviewed and are negative.   PHYSICAL EXAM:   VS:  BP 116/70   Pulse 64   Resp 18   Wt 116 lb (52.6 kg)   SpO2 96%   BMI  18.72 kg/m    GEN: Well nourished, well developed, in no acute distress  HEENT: normal  Neck: no JVD, carotid bruits, or masses Cardiac: RRR; no murmurs, rubs, or gallops,no edema. R groin cath site stable Respiratory:  clear to auscultation bilaterally, normal work of breathing GI: soft, nontender, nondistended, + BS MS: no deformity or atrophy  Skin: warm and dry, no rash Neuro:  Alert and Oriented x 3, Strength and sensation are intact Psych: euthymic mood, full affect  Wt Readings from Last 3 Encounters:  07/17/16 116 lb (52.6 kg)  07/10/16 121 lb 14.6 oz (55.3 kg)  06/29/16 114 lb 8 oz (51.9 kg)      Studies/Labs Reviewed:   EKG:  EKG is ordered today.  The ekg ordered today demonstrates NSR with non specific T wave changes in lateral leads.   Recent Labs: 02/26/2016: Magnesium 2.0 04/28/2016: TSH 1.54 07/08/2016: ALT 21; B Natriuretic Peptide 528.2 07/10/2016: BUN 11; Creatinine, Ser 0.76; Hemoglobin 12.1; Platelets 255; Potassium 3.9; Sodium 139   Lipid Panel    Component Value Date/Time   CHOL 139 07/08/2016 0800   TRIG 71 07/08/2016 0800   HDL 65 07/08/2016 0800   CHOLHDL 2.1 07/08/2016 0800   VLDL 14 07/08/2016 0800   LDLCALC 60 07/08/2016 0800    Additional studies/ records that were reviewed today include:   Coronary Stent Intervention 07/09/16  Left Heart Cath and Coronary Angiography  Conclusion     There is severe left ventricular systolic dysfunction.  LV end diastolic pressure is normal.  The left ventricular ejection fraction is 25-35% by visual estimate.  Prox Cx to Mid Cx lesion, 10 %stenosed.  Dist LAD lesion, 100 %stenosed.  Ost 1st Diag lesion, 80 %stenosed.  Prox LAD lesion, 75 %stenosed.  A STENT PROMUS PREM MR 3.0X8 drug eluting stent was successfully placed.  Post intervention, there is a 0% residual stenosis.  1. Single vessel obstructive CAD. The stent in the LCx is still patent. There is occlusion of the very distal LAD  that is old. There is a hazy ruptured plaque in the mid LAD that is felt to be the culprit lesion. The first diagonal has  a moderate ostial stenosis 2. Severe LV dysfunction with normal LVEDP 3. Successful stenting of the mid LAD with DES.  Plan: continue DAPT, medical management of residual disease.    Echo 07/08/16 LV EF: 30% - 35%  ------------------------------------------------------------------- Indications: Chest pain 786.51.  ------------------------------------------------------------------- History: PMH: Emphysema. Ischemic cardiomyopathy. Coronary artery disease. Congestive heart failure. Stroke. PMH: Myocardial infarction. Risk factors: Hypertension. Dyslipidemia.  ------------------------------------------------------------------- Study Conclusions  - Left ventricle: Septal inferior and inferior lateral wall hypokinesis The cavity size was moderately dilated. Wall thickness was increased in a pattern of mild LVH. Systolic function was moderately to severely reduced. The estimated ejection fraction was in the range of 30% to 35%. - Aortic valve: There was mild regurgitation. - Mitral valve: There was moderate regurgitation. - Left atrium: The atrium was mildly dilated. - Right atrium: The atrium was mildly dilated. - Atrial septum: No defect or patent foramen ovale was identified. - Tricuspid valve: There was moderate regurgitation. - Pulmonary arteries: PA peak pressure: 39 mm Hg (S).   ASSESSMENT & PLAN:    1. CAD s/p BMS to LCx 2010 and DEs to pro LAD 06/2016 - She has residual disease on recent cath as noted above. Tx with medical therapy. She is now DNR. Continue ASA, Plavix and statin.    2. ICM/ Acute on chronic systolic CHF - EF of 37-48%. Normal LVEDP by cath. Continue lasix, spironolactone and Losartan (hx of cough on lisinopril).   She has not been able to tolerate beta blockers. Euvolemic.   3. Recent CAP with hx of  Lung cancer - Still coughing with brown phlegm. Cough is stable. F/u with pulmonologist as schedule. Seems not related to Losartan.   4. HTN - Stable and well controlled.   5. HLD - 07/08/2016: Cholesterol 139; HDL 65; LDL Cholesterol 60; Triglycerides 71; VLDL 14  - Continue statin.   Medication Adjustments/Labs and Tests Ordered: Current medicines are reviewed at length with the patient today.  Concerns regarding medicines are outlined above.  Medication changes, Labs and Tests ordered today are listed in the Patient Instructions below. There are no Patient Instructions on file for this visit.   Jarrett Soho, [place]  07/17/2016 10:27 AM    [facility] [place], [place], [place]  [address] Phone: [phone]; Fax: [phone]

## 2016-07-17 ENCOUNTER — Ambulatory Visit (INDEPENDENT_AMBULATORY_CARE_PROVIDER_SITE_OTHER): Payer: PPO | Admitting: Physician Assistant

## 2016-07-17 VITALS — BP 116/70 | HR 64 | Resp 18 | Wt 116.0 lb

## 2016-07-17 DIAGNOSIS — I25119 Atherosclerotic heart disease of native coronary artery with unspecified angina pectoris: Secondary | ICD-10-CM

## 2016-07-17 DIAGNOSIS — I10 Essential (primary) hypertension: Secondary | ICD-10-CM | POA: Diagnosis not present

## 2016-07-17 DIAGNOSIS — R05 Cough: Secondary | ICD-10-CM

## 2016-07-17 DIAGNOSIS — I5042 Chronic combined systolic (congestive) and diastolic (congestive) heart failure: Secondary | ICD-10-CM

## 2016-07-17 DIAGNOSIS — R059 Cough, unspecified: Secondary | ICD-10-CM

## 2016-07-17 DIAGNOSIS — E785 Hyperlipidemia, unspecified: Secondary | ICD-10-CM | POA: Diagnosis not present

## 2016-07-17 NOTE — Patient Instructions (Signed)
Medication Instructions:  Your physician recommends that you continue on your current medications as directed. Please refer to the Current Medication list given to you today.   Labwork: None ordered  Testing/Procedures: None ordered  Follow-Up: Your physician recommends that you schedule a follow-up appointment in: 3 [place]   Any Other Special Instructions Will Be Listed Below (If Applicable).     If you need a refill on your cardiac medications before your next appointment, please call your pharmacy.

## 2016-07-30 ENCOUNTER — Telehealth: Payer: Self-pay

## 2016-07-30 ENCOUNTER — Ambulatory Visit: Payer: PPO | Admitting: Emergency Medicine

## 2016-07-30 NOTE — Telephone Encounter (Signed)
Pt is trying to get assistance for prolia; pt wants to know dx for prolia; osteoporosis with code M81.0. Pt voiced understanding.

## 2016-07-31 ENCOUNTER — Ambulatory Visit (INDEPENDENT_AMBULATORY_CARE_PROVIDER_SITE_OTHER): Payer: PPO | Admitting: Emergency Medicine

## 2016-07-31 ENCOUNTER — Encounter: Payer: Self-pay | Admitting: Emergency Medicine

## 2016-07-31 VITALS — BP 108/72 | HR 77 | Ht 66.0 in | Wt 116.8 lb

## 2016-07-31 DIAGNOSIS — J309 Allergic rhinitis, unspecified: Secondary | ICD-10-CM | POA: Insufficient documentation

## 2016-07-31 DIAGNOSIS — R911 Solitary pulmonary nodule: Secondary | ICD-10-CM | POA: Diagnosis not present

## 2016-07-31 MED ORDER — FLUTICASONE PROPIONATE 50 MCG/ACT NA SUSP: 2.0000 | Freq: Every day | NASAL | 5 refills | Status: DC

## 2016-07-31 MED ORDER — IPRATROPIUM-ALBUTEROL 0.5-2.5 (3) MG/3ML IN SOLN: 3.0000 mL | Freq: Three times a day (TID) | RESPIRATORY_TRACT | 5 refills | Status: DC

## 2016-07-31 MED ORDER — ALBUTEROL SULFATE (2.5 MG/3ML) 0.083% IN NEBU: 2.5000 mg | INHALATION_SOLUTION | RESPIRATORY_TRACT | 5 refills | Status: DC | PRN

## 2016-07-31 NOTE — Assessment & Plan Note (Signed)
Start fluticasone nasal spray to see if she benefits.

## 2016-07-31 NOTE — Assessment & Plan Note (Signed)
We will restart using DuoNeb on a schedule 3 times a day.  Please continue to use budesonide nebulized twice a day  You may use albuterol nebulized (or an inhaler) as needed for shortness of breath.

## 2016-07-31 NOTE — Patient Instructions (Addendum)
We will restart using DuoNeb on a schedule 3 times a day.  Please continue to use budesonide nebulized twice a day  You may use albuterol nebulized (or an inhaler) as needed for shortness of breath.  We will start fluticasone nasal spray 2 sprays each nostril daily.  We will repeat your CT scan in May 2018.                                                                            Follow with Dr Lamonte Sakai in 3 months or sooner if you have any problems.

## 2016-07-31 NOTE — Progress Notes (Signed)
Subjective:    Patient ID: [name], female    DOB: 11/14/1940, 76 y.o.   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 She has been having dyspnea for > 1 year, some cough that is productive of white mucous. She has hoarse voice frequently, not necessarily related to cough. Not clear that it relates in time to symbicort.    ROV 11/27/15 -- patient with a history of COPD, adenoCA of the lung, and pulmonary nodules that we have followed by CT scan of the chest. She had a PET scan in November 2016 as above. Her most recent CT scan of the chest was 11/18/15 that I personally reviewed today. Her left lower lobe superior segmental nodule may have changed slightly in size and shape but appears to be associated with evolving atx. Also, it was not hypermetabolic on PET from 22/29.  She has albuterol to use prn, uses it a few times a week.   ROV 07/31/16 -- This follow-up visit for patient with a history of COPD, lobe superior segmentectomy for adenocarcinoma of the lung in 2008.  We have been following a left lower lobe superior segmental nodule by serial CT scans. PET negative in November 2016. Repeat CT scan of the chest in November 2017 showed that the nodule is stable. There were some additional right middle lobe nodular opacities noted likely most consistent with chronic infectious process. She has been seen and treated for acute exacerbations twice since our last visit.  She is on budesonide bid, duoneb bid  Review of Systems     As per history of present illness    Objective:   Physical Exam Vitals:   07/31/16 0947  BP: 108/72  Pulse: 77  SpO2: 98%  Weight: 116 lb 12.8 oz (53 kg)  Height: '5\' 6"'$  (1.676 m)    Gen: Pleasant, elderly in no distress,  normal affect  ENT: No lesions,  mouth clear,  oropharynx clear, no postnasal drip, hoarse voice  Neck: No JVD, no  TMG, no carotid bruits  Lungs: No use of accessory muscles, very distant, clear without rales or rhonchi  Cardiovascular: RRR, heart sounds normal, no murmur or gallops, no peripheral edema  Musculoskeletal: No deformities, no cyanosis or clubbing  Neuro: alert, non focal  Skin: Warm, no lesions or rashes   11/18/15 --   COMPARISON: PET-CT - 05/31/2015; chest CT - 05/22/2015; 04/30/2014; 04/11/2013  FINDINGS: Mediastinum/Lymph Nodes: Scattered mediastinal lymph nodes are numerous though individually not enlarged by size criteria with index pretracheal lymph node measuring 0.8 cm in greatest short axis diameter (image 55, series 2). No bulky mediastinal, hilar axillary lymphadenopathy on this noncontrast examination.  Normal heart size. Coronary artery calcifications. A stent is seen within the proximal aspect of the left circumflex artery. No pericardial effusion.  Scattered calcified atherosclerotic plaque within a normal caliber thoracic aorta. Conventional configuration of the aortic arch.  Lungs/Pleura: Slightly spiculated nodule within the subpleural aspect the right lower lobe is grossly unchanged compared to the 05/22/2015 examination, measuring approximately 1.9 x 0.8 cm (image 124, series 3) with slight differences attributable to scan thickness.  Nodular opacity within the left lung apex is unchanged, measuring 1.8 x 0.9 cm (image 11, series 3, previously, 1.7 x 0.8 cm when compared to the 04/11/2013 examination.  Mixed solid and ground-glass nodule within the superior segment of the left lower  lobe has minimally increased in size in the interval, currently measuring approximately 1.5 x 0.6 cm (axial image 69, series 3), previously, 1.1 x 0.6 cm with more nodular component about its anterior and superior aspect (best seen on sagittal image 104, series 6), with slight differences potentially attributable to slice selection.  Stable postsurgical change  of the right lower lobe with grossly unchanged vascular crowding about the right hilum (representative image 99, series 3).  Advanced mixed centrilobular and paraseptal emphysematous change with apical predominance. No new focal airspace opacities. No pleural effusion or pneumothorax. The remaining pulmonary airways are widely patent.  Upper abdomen: There is unchanged thickening of the left adrenal gland without discrete nodule. Otherwise, normal noncontrast appearance of the upper abdomen.  Musculoskeletal: No acute or aggressive osseous abnormalities mild scoliotic curvature of the thoracic spine, convex to the left. Normal appearance of the thyroid gland.  IMPRESSION: 1. No new pulmonary nodules. 2. The ill-defined mixed solid and ground-glass nodule within the superior segment of the left lower lobe has very minimally increased in size in the interval, currently measuring 1.5 x 0.6 cm, previously, 1.1 x 0.6 cm, however as this nodule did NOT demonstrate increased metabolic activity on PET scan performed 05/31/2015 and is located in an area of chronic atelectasis (present since at least the 03/2013 examination) and while favored to represent an area evolving scar, a follow-up chest CT in 6 months is recommended to ensure continued stability. 3. Remaining bilateral pulmonary nodules are unchanged with slight differences attributable to scan slice selection. 4. Similar findings of advanced mixed centrilobular and paraseptal emphysematous change without superimposed acute cardiopulmonary disease. 5. Atherosclerosis including coronary artery calcifications.       Assessment & Plan:  COPD (chronic obstructive pulmonary disease) ([place]) We will restart using DuoNeb on a schedule 3 times a day.  Please continue to use budesonide nebulized twice a day  You may use albuterol nebulized (or an inhaler) as needed for shortness of breath.   Cancer of lower lobe of right lung  ([place]) Left lower lobe nodule appears to be stable. Inconsistent with recurrent malignancy. She does have 2 new right middle lobe nodules that will need to be followed. Repeat her CT scan of the chest in 11/2016  Allergic rhinitis Start fluticasone nasal spray to see if she benefits.  Baltazar Apo, MD, PhD 07/31/2016, 10:19 AM [facility] [phone] or if no answer [phone]

## 2016-07-31 NOTE — Assessment & Plan Note (Signed)
Left lower lobe nodule appears to be stable. Inconsistent with recurrent malignancy. She does have 2 new right middle lobe nodules that will need to be followed. Repeat her CT scan of the chest in 11/2016

## 2016-08-03 ENCOUNTER — Telehealth: Payer: Self-pay | Admitting: Family Medicine

## 2016-08-03 NOTE — Telephone Encounter (Signed)
Pt received bill for prolia, pt is stating that she shoul;d not owe this bill. Pt request cb. Thank you  cb number is [phone]

## 2016-08-07 ENCOUNTER — Telehealth: Payer: Self-pay | Admitting: Emergency Medicine

## 2016-08-07 MED ORDER — DOXYCYCLINE HYCLATE 100 MG PO TABS: 100.0000 mg | ORAL_TABLET | Freq: Two times a day (BID) | ORAL | 0 refills | Status: DC

## 2016-08-07 NOTE — Telephone Encounter (Signed)
OK to use OTC decongestant like tylenol cold & flu Would treat her with doxycycline '100mg'$  bid x 7 days.  If no better next week will need to be seen w a CXR

## 2016-08-07 NOTE — Telephone Encounter (Signed)
Spoke with pt's niece, who states pt has a prod cough with thick yellow mucus, sore throat, post nasal drip X1d Pt denies any fever, chills or sweats X1d Pt's niece is concerned about pt developing PNA again.  Pt hasn't taken any OTC medication.   RB please advise. Thanks.

## 2016-08-07 NOTE — Telephone Encounter (Signed)
Pt's niece is aware of RB's recommendations. Rx sent to preferred pharmacy. Nothing further needed.

## 2016-08-10 NOTE — Telephone Encounter (Signed)
[name], you will have to handle this b/c I do not have [name] PAN info.  The green folder that was w/the binder has some copies of pt's various assistance info.  You will need to email the info to [email or web address] and they will take care of filing it to the correct party.  If there's not a copy of her approval letter/card in that folder, then you will need to ask her to bring in a copy.  I do not see a copy in her chart anywhere.  Thank you.

## 2016-08-18 ENCOUNTER — Telehealth: Payer: Self-pay | Admitting: Family Medicine

## 2016-08-18 NOTE — Telephone Encounter (Signed)
Pt called, about bill.  I told her we have escalated the problem to office manager and person handling prolia, and that she would get a call by the end of the day tomorrow.  cb number for pt is [phone]

## 2016-08-18 NOTE — Telephone Encounter (Signed)
error 

## 2016-08-19 NOTE — Telephone Encounter (Signed)
Spoke to pt and advised her that I am currently investigating her PAN program. I am unfamiliar with the program and are awaiting a call from [place] with [employer] to return my call. Advised pt I will contact her once info is received.

## 2016-08-21 NOTE — Telephone Encounter (Signed)
Spoke to pt and advised her per Lenord Carbo program for assistance is no longer in effect. Advised pt she will need to locate her approval letter as it is not in the folder or her chart. Pt states she will look for it and contact office back if found

## 2016-08-21 NOTE — Telephone Encounter (Signed)
Patient called and asked for Waynetta to call her back about [employer].  Patient can be reached at [phone].

## 2016-08-27 NOTE — Telephone Encounter (Signed)
Patient asked for [name] to call her back about [employer].  Patient can be reached at [phone].

## 2016-09-02 NOTE — Telephone Encounter (Signed)
Spoke to pt who states she received a call from prolia indicating that she has qualified for the new program, and will not have to pay anything out of pocket. Advised pt that a prolia rep is coming on Friday, and I will discuss her benefits at that time.

## 2016-09-04 NOTE — Telephone Encounter (Signed)
Spoke to pt and advised per Joycelyn Schmid, she will need to contact the number on the bill she received regarding the amt she owes. Pt received assistance from the PAN program, to cover her injection, but that plan is no longer in effect. Pt has now qualified for a new assistance program, which allows $500, appx $250 per injection. Per Joycelyn Schmid, when pt is due in April, we are to submit the Prolia form, as we normally do. We will then receive a notification that the pt will owe a self pay balance. The injection is to then be given. Once pt receives the bill, she will need to contact the new assistance program and complete a benefits reimbursement form in order for the bill to be paid.

## 2016-09-10 DIAGNOSIS — H26493 Other secondary cataract, bilateral: Secondary | ICD-10-CM | POA: Diagnosis not present

## 2016-09-10 DIAGNOSIS — H1851 Endothelial corneal dystrophy: Secondary | ICD-10-CM | POA: Diagnosis not present

## 2016-09-18 ENCOUNTER — Other Ambulatory Visit: Payer: Self-pay | Admitting: Emergency Medicine

## 2016-09-18 ENCOUNTER — Telehealth: Payer: Self-pay | Admitting: Emergency Medicine

## 2016-09-18 DIAGNOSIS — J441 Chronic obstructive pulmonary disease with (acute) exacerbation: Secondary | ICD-10-CM

## 2016-09-18 MED ORDER — BUDESONIDE 0.5 MG/2ML IN SUSP: 0.5000 mg | Freq: Two times a day (BID) | RESPIRATORY_TRACT | 5 refills | Status: DC

## 2016-09-18 NOTE — Telephone Encounter (Signed)
Called and spoke to pt's niece, Almyra Free. Pt is needing a refill of Budesonide, rx sent to preferred pharmacy. Almyra Free verbalized understanding and denied any further questions or concerns at this time.

## 2016-09-28 ENCOUNTER — Telehealth: Payer: Self-pay | Admitting: *Deleted

## 2016-09-28 NOTE — Telephone Encounter (Signed)
Information has been submitted to pts insurance for verification of benefits. Awaiting response for coverage  

## 2016-09-29 ENCOUNTER — Other Ambulatory Visit: Payer: Self-pay

## 2016-09-29 ENCOUNTER — Telehealth: Payer: Self-pay | Admitting: Cardiovascular Disease

## 2016-09-29 DIAGNOSIS — I5042 Chronic combined systolic (congestive) and diastolic (congestive) heart failure: Secondary | ICD-10-CM

## 2016-09-29 MED ORDER — FUROSEMIDE 20 MG PO TABS: ORAL_TABLET | ORAL | 6 refills | Status: DC

## 2016-09-29 NOTE — Telephone Encounter (Signed)
I spoke with Amy at the pharmacy and made her aware of updated instructions on pt's Furosemide.  She asked that I send in a new Rx to update their system.  Furosemide was e prescribed.

## 2016-09-29 NOTE — Telephone Encounter (Signed)
New message     [name] from [facility] is calling because there is a discrepancy on her furosemide prescription. She needs current directions. Pt states they are different than what they have on last prescription from Dr. Burt Knack.

## 2016-10-07 ENCOUNTER — Telehealth: Payer: Self-pay | Admitting: Family Medicine

## 2016-10-07 NOTE — Telephone Encounter (Signed)
Pt called and is requesting call back about prolia.  Please call [phone] Thanks

## 2016-10-07 NOTE — Telephone Encounter (Signed)
Patient called to get an update on Prolia.  Patient can be reached at [phone].

## 2016-10-07 NOTE — Telephone Encounter (Signed)
Spoke to pt and reviewed 2/9 phone note regarding prolia benefits. Advised pt I will contact her back to schedule Ca lab once benefits have been confirmed.

## 2016-10-07 NOTE — Telephone Encounter (Signed)
See additional phone note from 10/07/16

## 2016-10-22 NOTE — Telephone Encounter (Signed)
Verification of benefits have been processed and an approval has been received for pts prolia injection. Pts estimated cost are appx $215 without an OV and $225 with. This is only an estimate and cannot be confirmed until benefits are paid. Please advise pt and schedule if needed. If scheduled, once the injection is received, pls contact me back with the date it was received so that I am able to update prolia folder. Thanks  If agreeable, pls schedule Ca lab and injection after 11/13/2016

## 2016-10-22 NOTE — Telephone Encounter (Signed)
Spoke with patient and scheduled appt 10/29/16 for labs and 11/24/16 for injection.

## 2016-10-29 ENCOUNTER — Other Ambulatory Visit (INDEPENDENT_AMBULATORY_CARE_PROVIDER_SITE_OTHER): Payer: PPO

## 2016-10-29 DIAGNOSIS — M816 Localized osteoporosis [Lequesne]: Secondary | ICD-10-CM | POA: Diagnosis not present

## 2016-10-29 LAB — CALCIUM: Calcium: 9.8 mg/dL (ref 8.4–10.5)

## 2016-11-02 ENCOUNTER — Encounter: Payer: Self-pay | Admitting: *Deleted

## 2016-11-03 ENCOUNTER — Ambulatory Visit (INDEPENDENT_AMBULATORY_CARE_PROVIDER_SITE_OTHER): Payer: PPO | Admitting: Emergency Medicine

## 2016-11-03 ENCOUNTER — Encounter: Payer: Self-pay | Admitting: Emergency Medicine

## 2016-11-03 DIAGNOSIS — R918 Other nonspecific abnormal finding of lung field: Secondary | ICD-10-CM | POA: Diagnosis not present

## 2016-11-03 DIAGNOSIS — J431 Panlobular emphysema: Secondary | ICD-10-CM

## 2016-11-03 DIAGNOSIS — J301 Allergic rhinitis due to pollen: Secondary | ICD-10-CM | POA: Diagnosis not present

## 2016-11-03 NOTE — Assessment & Plan Note (Signed)
Stop budesonide (Pulmicort) nebs for now Continue your DuoNeb three times a day on schedule through the mask.  Follow with Dr Lamonte Sakai in May to review your CT scan and medicines, or sooner if you have any problems.

## 2016-11-03 NOTE — Progress Notes (Signed)
Subjective:    Patient ID: [name], female    DOB: [birth year], 76 y.o.   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 She has been having dyspnea for > 1 year, some cough that is productive of white mucous. She has hoarse voice frequently, not necessarily related to cough. Not clear that it relates in time to symbicort.    ROV 11/27/15 -- patient with a history of COPD, adenoCA of the lung, and pulmonary nodules that we have followed by CT scan of the chest. She had a PET scan in November 2016 as above. Her most recent CT scan of the chest was 11/18/15 that I personally reviewed today. Her left lower lobe superior segmental nodule may have changed slightly in size and shape but appears to be associated with evolving atx. Also, it was not hypermetabolic on PET from 02/77.  She has albuterol to use prn, uses it a few times a week.   ROV 07/31/16 -- This follow-up visit for patient with a history of COPD, lobe superior segmentectomy for adenocarcinoma of the lung in 2008.  We have been following a left lower lobe superior segmental nodule by serial CT scans. PET negative in November 2016. Repeat CT scan of the chest in November 2017 showed that the nodule is stable. There were some additional right middle lobe nodular opacities noted likely most consistent with chronic infectious process. She has been seen and treated for acute exacerbations twice since our last visit.  She is on budesonide bid, duoneb bid  ROV 11/03/16 -- Pt with COPD, adenoCA s/p RLL sup seg resection 2008. Have been following a LLL nodule by serial Ct scan, last was 11/17.  She is on budesonide nebs bid, DuoNeb tid. She is on flonase qd. She is having hoarseness again, more problematic last few weeks. ? Allergy season.   Review of Systems     As per history of present illness    Objective:   Physical  Exam Vitals:   11/03/16 1451  BP: 106/64  Pulse: 70  SpO2: 97%  Weight: 125 lb (56.7 kg)  Height: '5\' 6"'$  (1.676 m)    Gen: Pleasant, elderly in no distress,  normal affect  ENT: No lesions,  mouth clear,  oropharynx clear, no postnasal drip, hoarse voice  Neck: No JVD, no TMG, no carotid bruits  Lungs: No use of accessory muscles, very distant, clear without rales or rhonchi  Cardiovascular: RRR, heart sounds normal, no murmur or gallops, no peripheral edema  Musculoskeletal: No deformities, no cyanosis or clubbing  Neuro: alert, non focal  Skin: Warm, no lesions or rashes   11/18/15 --   COMPARISON: PET-CT - 05/31/2015; chest CT - 05/22/2015; 04/30/2014; 04/11/2013  FINDINGS: Mediastinum/Lymph Nodes: Scattered mediastinal lymph nodes are numerous though individually not enlarged by size criteria with index pretracheal lymph node measuring 0.8 cm in greatest short axis diameter (image 55, series 2). No bulky mediastinal, hilar axillary lymphadenopathy on this noncontrast examination.  Normal heart size. Coronary artery calcifications. A stent is seen within the proximal aspect of the left circumflex artery. No pericardial effusion.  Scattered calcified atherosclerotic plaque within a normal caliber thoracic aorta. Conventional configuration of the aortic arch.  Lungs/Pleura: Slightly spiculated nodule within the subpleural aspect the right lower lobe is grossly unchanged compared to the 05/22/2015 examination, measuring approximately 1.9 x 0.8 cm (image 124,  series 3) with slight differences attributable to scan thickness.  Nodular opacity within the left lung apex is unchanged, measuring 1.8 x 0.9 cm (image 11, series 3, previously, 1.7 x 0.8 cm when compared to the 04/11/2013 examination.  Mixed solid and ground-glass nodule within the superior segment of the left lower lobe has minimally increased in size in the interval, currently measuring  approximately 1.5 x 0.6 cm (axial image 69, series 3), previously, 1.1 x 0.6 cm with more nodular component about its anterior and superior aspect (best seen on sagittal image 104, series 6), with slight differences potentially attributable to slice selection.  Stable postsurgical change of the right lower lobe with grossly unchanged vascular crowding about the right hilum (representative image 99, series 3).  Advanced mixed centrilobular and paraseptal emphysematous change with apical predominance. No new focal airspace opacities. No pleural effusion or pneumothorax. The remaining pulmonary airways are widely patent.  Upper abdomen: There is unchanged thickening of the left adrenal gland without discrete nodule. Otherwise, normal noncontrast appearance of the upper abdomen.  Musculoskeletal: No acute or aggressive osseous abnormalities mild scoliotic curvature of the thoracic spine, convex to the left. Normal appearance of the thyroid gland.  IMPRESSION: 1. No new pulmonary nodules. 2. The ill-defined mixed solid and ground-glass nodule within the superior segment of the left lower lobe has very minimally increased in size in the interval, currently measuring 1.5 x 0.6 cm, previously, 1.1 x 0.6 cm, however as this nodule did NOT demonstrate increased metabolic activity on PET scan performed 05/31/2015 and is located in an area of chronic atelectasis (present since at least the 03/2013 examination) and while favored to represent an area evolving scar, a follow-up chest CT in 6 months is recommended to ensure continued stability. 3. Remaining bilateral pulmonary nodules are unchanged with slight differences attributable to scan slice selection. 4. Similar findings of advanced mixed centrilobular and paraseptal emphysematous change without superimposed acute cardiopulmonary disease. 5. Atherosclerosis including coronary artery calcifications.       Assessment & Plan:   COPD (chronic obstructive pulmonary disease) (HCC) Stop budesonide (Pulmicort) nebs for now Continue your DuoNeb three times a day on schedule through the mask.  Follow with Dr Lamonte Sakai in May to review your CT scan and medicines, or sooner if you have any problems.  Pulmonary nodules Repeat Ct  Chest without contrast in May 2018  Allergic rhinitis Please start loratadine '10mg'$  daily at least through the allergy season.  Continue your flonase as you have been taking it/   Baltazar Apo, MD, PhD 11/03/2016, 3:30 PM [facility] [phone] or if no answer [phone]

## 2016-11-03 NOTE — Assessment & Plan Note (Signed)
Please start loratadine '10mg'$  daily at least through the allergy season.  Continue your flonase as you have been taking it/

## 2016-11-03 NOTE — Patient Instructions (Addendum)
Please start loratadine '10mg'$  daily at least through the allergy season.  Continue your flonase as you have been taking it/  Stop budesonide (Pulmicort) nebs for now Continue your DuoNeb three times a day on schedule through the mask.  Get your CT chest in May 2018, no contrast to follow your pulmonary nodules.  Follow with Dr Lamonte Sakai in May to review your CT scan and medicines, or sooner if you have any problems.

## 2016-11-03 NOTE — Assessment & Plan Note (Signed)
Repeat Ct  Chest without contrast in May 2018

## 2016-11-11 NOTE — Telephone Encounter (Signed)
Patient's niece,Julie Shepherd, called.  She needs more information about the grant for Prolia. Almyra Free can be reached at  [phone].

## 2016-11-12 NOTE — Telephone Encounter (Signed)
Spoke to pts niece Almyra Free and discussed pts assistance program. Pt will proceed with injection

## 2016-11-16 ENCOUNTER — Ambulatory Visit (INDEPENDENT_AMBULATORY_CARE_PROVIDER_SITE_OTHER): Payer: PPO | Admitting: Cardiovascular Disease

## 2016-11-16 ENCOUNTER — Encounter: Payer: Self-pay | Admitting: Cardiovascular Disease

## 2016-11-16 DIAGNOSIS — I5042 Chronic combined systolic (congestive) and diastolic (congestive) heart failure: Secondary | ICD-10-CM | POA: Diagnosis not present

## 2016-11-16 MED ORDER — FUROSEMIDE 20 MG PO TABS: 20.0000 mg | ORAL_TABLET | Freq: Every day | ORAL | 8 refills | Status: DC

## 2016-11-16 NOTE — Progress Notes (Signed)
Cardiology Office Note Date:  11/16/2016   ID:  [name], DOB [birth year], MRN [id number]  PCP:  Ria Bush, MD  Cardiologist:  Sherren Mocha, MD    Chief Complaint  Patient presents with  . Follow-up     History of Present Illness: [name] is a 76 y.o. female who presents for Follow-up of coronary artery disease and chronic systolic heart failure.  She initially presented with an acute MI in 2010 with LCx occlusion tx with primary PCI with BMS. She also has a hx of R MCA strokein 2013, non-small cell lung CA s/p resection, COPD. She presented in 7/17 with an anterior STEMI and was found to have an apical occlusion of the LAD. This was treated medically. She was admitted in 9/17 with chest discomfort. Cardiac markers were normal. She was seen by Dr. Tamala Julian, who did her cardiac catheterization in7/17. No further workup was recommended. Medical therapy was continued. Unable to tolerate BB or ACEi with her lung disease. The patient was readmitted in December 2017 with acute coronary syndrome and ruled in for non-STEMI. She underwent cardiac catheterization demonstrating a ruptured plaque in the LAD and she was treated with PCI without complication. LVEF is severely reduced. Her LVEDP was normal. Medications were adjusted. The patient presents today for follow-up evaluation.  She reports no recurrent chest pain or left arm pain. Continues to be short of breath. She has recently seen Dr. Lamonte Sakai. No orthopnea, PND, heart palpitations, or leg swelling.   Past Medical History:  Diagnosis Date  . Arthritis   . CAD (coronary artery disease) 03/2009   s/p MI  . Chronic combined systolic and diastolic heart failure ([place]) 06/17/2009   Qualifier: Diagnosis of  By: Burt Knack, MD, [place]burn Pert   . CKD (chronic kidney disease) stage 3, GFR 30-59 ml/min   . Emphysema/COPD ([place])   . Ex-smoker quit 2008  . GERD (gastroesophageal reflux disease)   . HCAP (healthcare-associated  pneumonia) 03/19/2016  . Helicobacter pylori gastritis 10/2007   treated  . History of CVA (cerebrovascular accident) without residual deficits 01/2012, 06/2012   R hemorrhagic MCA 01/2012 with remote lacunar infarct L putamen and IC, rpt 06/2012 acute multifocal R MCA infarct with remote hemorrhagic strokes affecting L basal ganglia and periventricular white matter, full recovery  . HLD (hyperlipidemia)   . HTN (hypertension)   . Ischemic cardiomyopathy 02/27/2014  . Lung cancer ([place]) dx'd 07/2006   Lung CA, s/p resection, followed by Dr Earlie Server  . Macular degeneration   . Myocardial infarction [facility]) 03/2009   Acute myocardial infarction 2010 - treated with BMS of LCx. LVEF 50% with subsequent CHF  . Osteoporosis 09/2013   T -3.6 forearm 09/2013, T -4.5 forearm 04/2015  . Stroke [facility])     Past Surgical History:  Procedure Laterality Date  . BREAST BIOPSY Left   . CARDIAC CATHETERIZATION N/A 02/24/2016   Procedure: Left Heart Cath and Coronary Angiography;  Surgeon: Belva Crome, MD;  Location: [facility];  Service: Cardiovascular;  Laterality: N/A;  . CARDIAC CATHETERIZATION N/A 07/09/2016   Procedure: Left Heart Cath and Coronary Angiography;  Surgeon: Peter M Martinique, MD;  Location: [facility];  Service: Cardiovascular;  Laterality: N/A;  . CARDIAC CATHETERIZATION N/A 07/09/2016   Procedure: Coronary Stent Intervention;  Surgeon: Peter M Martinique, MD;  Location: [facility];  Service: Cardiovascular;  Laterality: N/A;  . CATARACT EXTRACTION Bilateral   . CORONARY STENT PLACEMENT  06/2016  DES to mid LAD  . EYE SURGERY    . LUNG REMOVAL, PARTIAL Right 2008  . PARTIAL HYSTERECTOMY  1986   irregular periods, ovaries remain    Current Outpatient Prescriptions  Medication Sig Dispense Refill  . albuterol (PROAIR HFA) 108 (90 Base) MCG/ACT inhaler Inhale 2 puffs into the lungs every 4 (four) hours as needed for wheezing or shortness of breath. 1 Inhaler 1  . albuterol  (PROVENTIL) (2.5 MG/3ML) 0.083% nebulizer solution Take 3 mLs (2.5 mg total) by nebulization every 4 (four) hours as needed for wheezing or shortness of breath. 150 mL 5  . aspirin EC 81 MG tablet Take 1 tablet (81 mg total) by mouth daily.    . budesonide (PULMICORT) 0.5 MG/2ML nebulizer solution Take 2 mLs (0.5 mg total) by nebulization 2 (two) times daily. 120 mL 5  . Calcium Carbonate-Vitamin D (CALCIUM-VITAMIN D) 500-200 MG-UNIT per tablet Take 1 tablet by mouth 2 (two) times daily.     . Cholecalciferol (VITAMIN D) 2000 UNITS CAPS Take 1 capsule by mouth daily.    . clopidogrel (PLAVIX) 75 MG tablet Take 1 tablet (75 mg total) by mouth daily. 30 tablet 12  . denosumab (PROLIA) 60 MG/ML SOLN injection Inject 60 mg into the skin every 6 (six) months. Administer in upper arm, thigh, or abdomen 60 mL 1  . fluticasone (FLONASE) 50 MCG/ACT nasal spray Place 2 sprays into both nostrils daily. 16 g 5  . furosemide (LASIX) 20 MG tablet Take 2 tablets by mouth daily on Monday, Wednesday and Friday and one tablet by mouth daily all other days 42 tablet 6  . isosorbide mononitrate (IMDUR) 30 MG 24 hr tablet TAKE 1 TABLET BY MOUTH DAILY 30 tablet 10  . loratadine (CLARITIN) 10 MG tablet Take 10 mg by mouth daily.    Marland Kitchen losartan (COZAAR) 25 MG tablet Take 0.5 tablets (12.5 mg total) by mouth daily. 30 tablet 6  . LOTEMAX 0.5 % ophthalmic suspension Place 1 drop into both eyes daily.     . nitroGLYCERIN (NITROSTAT) 0.4 MG SL tablet Place 1 tablet (0.4 mg total) under the tongue every 5 (five) minutes as needed for chest pain. 25 tablet 3  . pantoprazole (PROTONIX) 40 MG tablet Take 1 tablet (40 mg total) by mouth daily. 30 tablet 11  . simvastatin (ZOCOR) 40 MG tablet TAKE ONE TABLET BY MOUTH EVERY NIGHT AT BEDTIME 30 tablet 6  . spironolactone (ALDACTONE) 25 MG tablet TAKE 1/2 TABLET BY MOUTH ONCE DAILY 30 tablet 11   No current facility-administered medications for this visit.     Allergies:   Actonel  [risedronate sodium]; Amlodipine; Fosamax [alendronate sodium]; Lisinopril; Pravastatin; and Codeine   Social History:  The patient  reports that she has quit smoking. Her smoking use included Cigarettes. She has never used smokeless tobacco. She reports that she does not drink alcohol or use drugs.   Family History:  The patient's  family history includes Breast cancer in her sister; CAD (age of onset: 70) in her father and mother; Diabetes in her sister; Heart attack in her mother; Hypertension in her mother; Stroke in her father and mother.    ROS:  Please see the history of present illness.  Otherwise, review of systems is positive for Easy bruising, hoarseness.  All other systems are reviewed and negative.    PHYSICAL EXAM: VS:  BP 120/70   Pulse 62   Ht '5\' 6"'$  (1.676 m)   Wt 127 lb 1.9 oz (  57.7 kg)   BMI 20.52 kg/m  , BMI Body mass index is 20.52 kg/m. GEN: pleasant elderly woman, mild shortness of breath with conversation HEENT: normal  Neck: no JVD, no masses. No carotid bruits Cardiac: RRR without murmur or gallop                Respiratory:  Decreased air movement throughout GI: soft, nontender, nondistended, + BS MS: no deformity or atrophy  Ext: no pretibial edema, pedal pulses 2+= bilaterally Skin: warm and dry, no rash Neuro:  Strength and sensation are intact Psych: euthymic mood, full affect  EKG:  EKG is not ordered today.  Recent Labs: 02/26/2016: Magnesium 2.0 04/28/2016: TSH 1.54 07/08/2016: ALT 21; B Natriuretic Peptide 528.2 07/10/2016: BUN 11; Creatinine, Ser 0.76; Hemoglobin 12.1; Platelets 255; Potassium 3.9; Sodium 139   Lipid Panel     Component Value Date/Time   CHOL 139 07/08/2016 0800   TRIG 71 07/08/2016 0800   HDL 65 07/08/2016 0800   CHOLHDL 2.1 07/08/2016 0800   VLDL 14 07/08/2016 0800   LDLCALC 60 07/08/2016 0800      Wt Readings from Last 3 Encounters:  11/16/16 127 lb 1.9 oz (57.7 kg)  11/03/16 125 lb (56.7 kg)  07/31/16 116 lb  12.8 oz (53 kg)     Cardiac Studies Reviewed: Cardiac Cath 07-09-2016: Conclusion     There is severe left ventricular systolic dysfunction.  LV end diastolic pressure is normal.  The left ventricular ejection fraction is 25-35% by visual estimate.  Prox Cx to Mid Cx lesion, 10 %stenosed.  Dist LAD lesion, 100 %stenosed.  Ost 1st Diag lesion, 80 %stenosed.  Prox LAD lesion, 75 %stenosed.  A STENT PROMUS PREM MR 3.0X8 drug eluting stent was successfully placed.  Post intervention, there is a 0% residual stenosis.   1. Single vessel obstructive CAD. The stent in the LCx is still patent. There is occlusion of the very distal LAD that is old. There is a hazy ruptured plaque in the mid LAD that is felt to be the culprit lesion. The first diagonal has a moderate ostial stenosis 2. Severe LV dysfunction with normal LVEDP 3. Successful stenting of the mid LAD with DES.  Plan: continue DAPT, medical management of residual disease.      ASSESSMENT AND PLAN: 1.  Chronic systolic heart failure, NYHA 2-3 symptoms. Likely multifactorial with chronic lung disease also contributing. Will reduce furosemide to 20 mg daily. Continue spironolactone 25 mg, one half daily. She tolerates a very low dose of losartan. Has not tolerated beta blocker Rx. BP generally runs quite low and it has been difficult to titrate medical Rx over time.   2. CAD, native vessel, with angina: She continues on dual antiplatelet therapy with aspirin and clopidogrel as well as isosorbide for antianginal therapy.  3. Hypertension: Blood pressure controlled on current medical program  4. Hyperlipidemia: Most recent lipids reviewed with LDL cholesterol 60. Continue simvastatin 40 mg.  Current medicines are reviewed with the patient today.  The patient does not have concerns regarding medicines.  Labs/ tests ordered today include:  No orders of the defined types were placed in this encounter.   Disposition:   FU 6  months  Signed, Sherren Mocha, MD  11/16/2016 3:26 PM    [address], [place], [place]  [id number] Phone: [phone]; Fax: [phone]

## 2016-11-16 NOTE — Patient Instructions (Signed)
Medication Instructions:  Your physician has recommended you make the following change in your medication:  1. CHANGE Furosemide dosage to '20mg'$  daily  Labwork: No new orders.   Testing/Procedures: No new orders.   Follow-Up: Your physician wants you to follow-up in: 6 MONTHS with Dr Burt Knack.  You will receive a reminder letter in the mail two months in advance. If you don't receive a letter, please call our office to schedule the follow-up appointment.   Any Other Special Instructions Will Be Listed Below (If Applicable).     If you need a refill on your cardiac medications before your next appointment, please call your pharmacy.

## 2016-11-20 ENCOUNTER — Telehealth: Payer: Self-pay | Admitting: Cardiovascular Disease

## 2016-11-20 DIAGNOSIS — I5042 Chronic combined systolic (congestive) and diastolic (congestive) heart failure: Secondary | ICD-10-CM

## 2016-11-20 NOTE — Telephone Encounter (Signed)
Returned call to [place], not on [place].  Spoke with patient on phone and given permission to speak with her about her concerns.    States they were in to see Dr. Burt Knack on 4/23 and her lasix was decreased to '20mg'$  daily and is now experiencing some SOB.  Reports increase in weight by 2 lbs since visit, no increase in swelling.  Wondering is she can go back to previous dosing of lasix, '40mg'$  every other day. Advised ok to take extra dose today and Sunday (as previously prescribed) continue to monitor and would reach out to Dr. Burt Knack for further recommendations.   Verbalized understanding.  Aware of the 24/7 line to call if needed.

## 2016-11-20 NOTE — Telephone Encounter (Signed)
New Message    Pt c/o medication issue:  1. Name of Medication: furosemide (LASIX) 20 MG tablet  2. How are you currently taking this medication (dosage and times per day)? As prescribed  3. Are you having a reaction (difficulty breathing--STAT)? Some SOB  4. What is your medication issue? Lasix was decreased, and pt is starting to have SOB, wants to know if she can go back to the dosage she was taking before. Requesting call back

## 2016-11-21 ENCOUNTER — Other Ambulatory Visit: Payer: Self-pay | Admitting: Family Medicine

## 2016-11-21 DIAGNOSIS — R7303 Prediabetes: Secondary | ICD-10-CM

## 2016-11-21 DIAGNOSIS — E559 Vitamin D deficiency, unspecified: Secondary | ICD-10-CM

## 2016-11-21 DIAGNOSIS — E785 Hyperlipidemia, unspecified: Secondary | ICD-10-CM

## 2016-11-21 DIAGNOSIS — N183 Chronic kidney disease, stage 3 unspecified: Secondary | ICD-10-CM

## 2016-11-22 NOTE — Telephone Encounter (Signed)
Chart reviewed. Would go back to furosemide 40 mg daily

## 2016-11-23 ENCOUNTER — Telehealth: Payer: Self-pay | Admitting: Cardiovascular Disease

## 2016-11-23 DIAGNOSIS — I5042 Chronic combined systolic (congestive) and diastolic (congestive) heart failure: Secondary | ICD-10-CM

## 2016-11-23 MED ORDER — FUROSEMIDE 20 MG PO TABS: ORAL_TABLET | ORAL | 3 refills | Status: DC

## 2016-11-23 MED ORDER — FUROSEMIDE 20 MG PO TABS: ORAL_TABLET | ORAL | 8 refills | Status: DC

## 2016-11-23 NOTE — Telephone Encounter (Signed)
Pt's medication was refilled and sent to pt's pharmacy as requested. Confirmation received.

## 2016-11-23 NOTE — Telephone Encounter (Signed)
I spoke with the pt and her previous dose prior to appointment was Furosemide '40mg'$  on Monday, Wednesday and Friday and '20mg'$  all other days.  The pt did take extra Furosemide over the weekend and had improvement in abdominal swelling.  The pt will resume her normal regimen today and I will update her chart. The pt will contact the office if she has symptoms of SOB, swelling and weight gain. Pt agreed with plan.

## 2016-11-23 NOTE — Telephone Encounter (Signed)
New message        *STAT* If patient is at the pharmacy, call can be transferred to refill team.   1. Which medications need to be refilled? (please list name of each medication and dose if known)  Furosemide '20mg'$  2. Which pharmacy/location (including street and city if local pharmacy) is medication to be sent to? [place].  Medication was called in to wrong pharmacy 3. Do they need a 30 day or 90 day supply? 90 day if possible. Please change records to have [place] as [facility].

## 2016-11-24 ENCOUNTER — Ambulatory Visit: Payer: PPO

## 2016-11-25 ENCOUNTER — Ambulatory Visit (INDEPENDENT_AMBULATORY_CARE_PROVIDER_SITE_OTHER): Payer: PPO

## 2016-11-25 VITALS — BP 112/60 | HR 41 | Temp 97.9°F | Ht 65.75 in | Wt 123.8 lb

## 2016-11-25 DIAGNOSIS — E785 Hyperlipidemia, unspecified: Secondary | ICD-10-CM

## 2016-11-25 DIAGNOSIS — R7303 Prediabetes: Secondary | ICD-10-CM | POA: Diagnosis not present

## 2016-11-25 DIAGNOSIS — E559 Vitamin D deficiency, unspecified: Secondary | ICD-10-CM

## 2016-11-25 DIAGNOSIS — N183 Chronic kidney disease, stage 3 unspecified: Secondary | ICD-10-CM

## 2016-11-25 DIAGNOSIS — Z0000 Encounter for general adult medical examination without abnormal findings: Secondary | ICD-10-CM | POA: Diagnosis not present

## 2016-11-25 LAB — RENAL FUNCTION PANEL
Albumin: 4.1 g/dL (ref 3.5–5.2)
BUN: 22 mg/dL (ref 6–23)
CO2: 29 mEq/L (ref 19–32)
CREATININE: 1.08 mg/dL (ref 0.40–1.20)
Calcium: 9.6 mg/dL (ref 8.4–10.5)
Chloride: 103 mEq/L (ref 96–112)
GFR: 52.43 mL/min — AB (ref >60.00)
GLUCOSE: 82 mg/dL (ref 70–99)
PHOSPHORUS: 3.2 mg/dL (ref 2.3–4.6)
Potassium: 4 mEq/L (ref 3.5–5.1)
Sodium: 138 mEq/L (ref 135–145)

## 2016-11-25 LAB — HEMOGLOBIN A1C: HEMOGLOBIN A1C: 6.1 % (ref 4.6–6.5)

## 2016-11-25 LAB — VITAMIN D 25 HYDROXY (VIT D DEFICIENCY, FRACTURES): VITD: 81.46 ng/mL (ref 30.00–100.00)

## 2016-11-25 LAB — LDL CHOLESTEROL, DIRECT: LDL DIRECT: 50 mg/dL

## 2016-11-25 NOTE — Progress Notes (Signed)
Pre visit review using our clinic review tool, if applicable. No additional management support is needed unless otherwise documented below in the visit note. 

## 2016-11-25 NOTE — Progress Notes (Signed)
Subjective:   [name] is a 76 y.o. female who presents for Medicare Annual (Subsequent) preventive examination.  Review of Systems:  N/A Cardiac Risk Factors include: advanced age (>26mn, >>50women);dyslipidemia;hypertension     Objective:     Vitals: BP 112/60 (BP Location: Right Arm, Patient Position: Sitting, Cuff Size: Normal)   Pulse (!) 41   Temp 97.9 F (36.6 C) (Oral)   Ht 5' 5.75" (1.67 m) Comment: no shoes  Wt 123 lb 12 oz (56.1 kg)   SpO2 96%   BMI 20.13 kg/m   Body mass index is 20.13 kg/m.   Tobacco History  Smoking Status  . Former Smoker  . Types: Cigarettes  Smokeless Tobacco  . Never Used     Counseling given: No   Past Medical History:  Diagnosis Date  . Arthritis   . CAD (coronary artery disease) 03/2009   s/p MI  . [place] systolic [place] diastolic [place]   Qualifier: Diagnosis of  By: CBurt Knack MD, MClayburn Pert  . CKD (chronic kidney disease) stage 3, GFR 30-59 ml/min   . Emphysema/COPD ([place]   . Ex-smoker quit 2008  . GERD (gastroesophageal reflux disease)   . HCAP (healthcare-associated pneumonia) 03/19/2016  . Helicobacter pylori gastritis 10/2007   treated  . History of CVA (cerebrovascular accident) without residual deficits 01/2012, 06/2012   R hemorrhagic MCA 01/2012 with remote lacunar infarct L putamen and IC, rpt 06/2012 acute multifocal R MCA infarct with remote hemorrhagic strokes affecting L basal ganglia and periventricular white matter, full recovery  . HLD (hyperlipidemia)   . HTN (hypertension)   . Ischemic cardiomyopathy 02/27/2014  . Lung cancer ([facility] dx'd 07/2006   Lung CA, s/p resection, followed by Dr MEarlie Server . Macular degeneration   . Myocardial infarction ([facility] 03/2009   Acute myocardial infarction 2010 - treated with BMS of LCx. LVEF 50% with subsequent CHF  . Osteoporosis 09/2013   T -3.6 forearm 09/2013, T -4.5 forearm 04/2015  . Stroke ([facility]    Past Surgical History:    Procedure Laterality Date  . BREAST BIOPSY Left   . CARDIAC CATHETERIZATION N/A 02/24/2016   Procedure: Left Heart Cath and Coronary Angiography;  Surgeon: HBelva Crome MD;  [place] [facility];  Service: Cardiovascular;  Laterality: N/A;  . CARDIAC CATHETERIZATION N/A 07/09/2016   Procedure: Left Heart Cath and Coronary Angiography;  Surgeon: Peter M JMartinique MD;  [place];  Service: Cardiovascular;  Laterality: N/A;  . CARDIAC CATHETERIZATION N/A 07/09/2016   Procedure: Coronary Stent Intervention;  Surgeon: Peter M JMartinique MD;  [place];  Service: Cardiovascular;  Laterality: N/A;  . CATARACT EXTRACTION Bilateral   . CORONARY STENT PLACEMENT  06/2016   DES to mid LAD  . EYE SURGERY    . LUNG REMOVAL, PARTIAL Right 2008  . PARTIAL HYSTERECTOMY  1986   irregular periods, ovaries remain   Family History  Problem Relation Age of Onset  . CAD Mother 675   MI  . Stroke Mother   . Hypertension Mother   . Heart attack Mother   . CAD Father 619 . Stroke Father   . Breast cancer Sister   . Diabetes Sister    History  Sexual Activity  . Sexual activity: No    Outpatient Encounter Prescriptions as of 11/25/2016  Medication Sig  . albuterol (PROAIR HFA) 108 (90 Base) MCG/ACT inhaler Inhale 2 puffs into  the lungs every 4 (four) hours as needed for wheezing or shortness of breath.  Marland Kitchen albuterol (PROVENTIL) (2.5 MG/3ML) 0.083% nebulizer solution Take 3 mLs (2.5 mg total) by nebulization every 4 (four) hours as needed for wheezing or shortness of breath.  Marland Kitchen aspirin EC 81 MG tablet Take 1 tablet (81 mg total) by mouth daily.  . Calcium Carbonate-Vitamin D (CALCIUM-VITAMIN D) 500-200 MG-UNIT per tablet Take 1 tablet by mouth 2 (two) times daily.   . Cholecalciferol (VITAMIN D) 2000 UNITS CAPS Take 1 capsule by mouth daily.  . clopidogrel (PLAVIX) 75 MG tablet Take 1 tablet (75 mg total) by mouth daily.  Marland Kitchen denosumab (PROLIA) 60 MG/ML SOLN  injection Inject 60 mg into the skin every 6 (six) months. Administer in upper arm, thigh, or abdomen  . fluticasone (FLONASE) 50 MCG/ACT nasal spray Place 2 sprays into both nostrils daily.  . furosemide (LASIX) 20 MG tablet Take 2 tablets by mouth on Monday, Wednesday and Friday. One tablet by mouth all other days.  . isosorbide mononitrate (IMDUR) 30 MG 24 hr tablet TAKE 1 TABLET BY MOUTH DAILY  . loratadine (CLARITIN) 10 MG tablet Take 10 mg by mouth daily.  Marland Kitchen losartan (COZAAR) 25 MG tablet Take 0.5 tablets (12.5 mg total) by mouth daily.  Marland Kitchen LOTEMAX 0.5 % ophthalmic suspension Place 1 drop into both eyes daily.   . nitroGLYCERIN (NITROSTAT) 0.4 MG SL tablet Place 1 tablet (0.4 mg total) under the tongue every 5 (five) minutes as needed for chest pain.  . pantoprazole (PROTONIX) 40 MG tablet Take 1 tablet (40 mg total) by mouth daily.  . simvastatin (ZOCOR) 40 MG tablet TAKE ONE TABLET BY MOUTH EVERY NIGHT AT BEDTIME  . spironolactone (ALDACTONE) 25 MG tablet TAKE 1/2 TABLET BY MOUTH ONCE DAILY  . [DISCONTINUED] budesonide (PULMICORT) 0.5 MG/2ML nebulizer solution Take 2 mLs (0.5 mg total) by nebulization 2 (two) times daily.   No facility-administered encounter medications on file as of 11/25/2016.     Activities of Daily Living In your present state of health, do you have any difficulty performing the following activities: 11/25/2016 07/08/2016  Hearing? N N  Vision? Y N  Difficulty concentrating or making decisions? N Y  Walking or climbing stairs? N N  Dressing or bathing? N N  Doing errands, shopping? N N  Preparing Food and eating ? N -  Using the Toilet? N -  In the past six months, have you accidently leaked urine? N -  Do you have problems with loss of bowel control? N -  Managing your Medications? N -  Managing your Finances? N -  Housekeeping or managing your Housekeeping? N -  Some recent data might be hidden    Patient Care Team: Ria Bush, MD as PCP - General  (Family Medicine)    Assessment:     Hearing Screening   '125Hz'$  '250Hz'$  '500Hz'$  '1000Hz'$  '2000Hz'$  '3000Hz'$  '4000Hz'$  '6000Hz'$  '8000Hz'$   Right ear:   40 40 40  40    Left ear:   0 40 0  0    Vision Screening Comments: Last vision exam in Aug 2017    Exercise Activities and Dietary recommendations Current Exercise Habits: The patient does not participate in regular exercise at present, Exercise limited by: None identified  Goals    . Increase physical activity          Starting 01/08/2016, I will continue to walk for 30 min daily.     . Increase physical activity  Starting 11/25/2016, I will continue to walk at least 15 min daily.      Fall Risk Fall Risk  11/25/2016 03/31/2016 03/09/2016 02/07/2016 01/08/2016  Falls in the past year? No No No No No  Risk for fall due to : - - Medication side effect - -   Depression Screen PHQ 2/9 Scores 11/25/2016 03/31/2016 03/09/2016 03/09/2016  PHQ - 2 Score 0 0 0 0  PHQ- 9 Score - - 0 -     Cognitive Function MMSE - Mini Mental State Exam 11/25/2016 01/08/2016  Orientation to time 5 5  Orientation to Place 5 5  Registration 3 3  Attention/ Calculation 0 0  Recall 3 3  Language- name 2 objects 0 0  Language- repeat 1 1  Language- follow 3 step command 3 3  Language- read & follow direction 0 0  Write a sentence 0 0  Copy design 0 0  Total score 20 20       PLEASE NOTE: A Mini-Cog screen was completed. Maximum score is 20. A value of 0 denotes this part of Folstein MMSE was not completed or the patient failed this part of the Mini-Cog screening.   Mini-Cog Screening Orientation to Time - Max 5 pts Orientation to Place - Max 5 pts Registration - Max 3 pts Recall - Max 3 pts Language Repeat - Max 1 pts Language Follow 3 Step Command - Max 3 pts   Immunization History  Administered Date(s) Administered  . Influenza Split 03/29/2012, 05/08/2013  . Influenza,inj,Quad PF,36+ Mos 02/24/2014, 04/08/2015, 04/01/2016  . Pneumococcal Conjugate-13  05/08/2015  . Pneumococcal Polysaccharide-23 07/27/2009  . Zoster 07/28/2011   Screening Tests Health Maintenance  Topic Date Due  . DTaP/Tdap/Td (1 - Tdap) 07/26/2022 (Originally 12/10/1959)  . INFLUENZA VACCINE  02/24/2017  . COLON CANCER SCREENING ANNUAL FOBT  05/07/2017  . TETANUS/TDAP  07/08/2022  . DEXA SCAN  Completed  . PNA vac Low Risk Adult  Completed      Plan:     I have personally reviewed and addressed the Medicare Annual Wellness questionnaire and have noted the following in the patient's chart:  A. Medical and social history B. Use of alcohol, tobacco or illicit drugs  C. Current medications and supplements D. Functional ability and status E.  Nutritional status F.  Physical activity G. Advance directives H. List of other physicians I.  Hospitalizations, surgeries, and ER visits in previous 12 months J.  [place] to include hearing, vision, cognitive, depression L. Referrals and appointments - none  In addition, I have reviewed and discussed with patient certain preventive protocols, quality metrics, and best practice recommendations. A written personalized care plan for preventive services as well as general preventive health recommendations were provided to patient.  See attached scanned questionnaire for additional information.   Signed,   Lindell Noe, MHA, BS, LPN Health Coach

## 2016-11-25 NOTE — Progress Notes (Signed)
I reviewed health advisor's note, was available for consultation, and agree with documentation and plan.  

## 2016-11-25 NOTE — Patient Instructions (Signed)
[name] , Thank you for taking time to come for your Medicare Wellness Visit. I appreciate your ongoing commitment to your health goals. Please review the following plan we discussed and let me know if I can assist you in the future.   These are the goals we discussed: Goals                  . Increase physical activity          Starting 11/25/2016, I will continue to walk at least 15 min daily.       This is a list of the screening recommended for you and due dates:  Health Maintenance  Topic Date Due  . DTaP/Tdap/Td vaccine (1 - Tdap) 07/26/2022*  . Flu Shot  02/24/2017  . Stool Blood Test  05/07/2017  . Tetanus Vaccine  07/08/2022  . DEXA scan (bone density measurement)  Completed  . Pneumonia vaccines  Completed  *Topic was postponed. The date shown is not the original due date.   Preventive Care for Adults  A healthy lifestyle and preventive care can promote health and wellness. Preventive health guidelines for adults include the following key practices.  . A routine yearly physical is a good way to check with your health care provider about your health and preventive screening. It is a chance to share any concerns and updates on your health and to receive a thorough exam.  . Visit your dentist for a routine exam and preventive care every 6 months. Brush your teeth twice a day and floss once a day. Good oral hygiene prevents tooth decay and gum disease.  . The frequency of eye exams is based on your age, health, family medical history, use  of contact lenses, and other factors. Follow your health care provider's ecommendations for frequency of eye exams.  . Eat a healthy diet. Foods like vegetables, fruits, whole grains, low-fat dairy products, and lean protein foods contain the nutrients you need without too many calories. Decrease your intake of foods high in solid fats, added sugars, and salt. Eat the right amount of calories for you. Get information about a proper diet  from your health care provider, if necessary.  . Regular physical exercise is one of the most important things you can do for your health. Most adults should get at least 150 minutes of moderate-intensity exercise (any activity that increases your heart rate and causes you to sweat) each week. In addition, most adults need muscle-strengthening exercises on 2 or more days a week.  Silver Sneakers may be a benefit available to you. To determine eligibility, you may visit the website: www.silversneakers.com or contact program at 803-863-2154 Mon-Fri between 8AM-8PM.   . Maintain a healthy weight. The body mass index (BMI) is a screening tool to identify possible weight problems. It provides an estimate of body fat based on height and weight. Your health care provider can find your BMI and can help you achieve or maintain a healthy weight.   For adults 20 years and older: ? A BMI below 18.5 is considered underweight. ? A BMI of 18.5 to 24.9 is normal. ? A BMI of 25 to 29.9 is considered overweight. ? A BMI of 30 and above is considered obese.   . Maintain normal blood lipids and cholesterol levels by exercising and minimizing your intake of saturated fat. Eat a balanced diet with plenty of fruit and vegetables. Blood tests for lipids and cholesterol should begin at age 23 and be  repeated every 5 years. If your lipid or cholesterol levels are high, you are over 50, or you are at high risk for heart disease, you may need your cholesterol levels checked more frequently. Ongoing high lipid and cholesterol levels should be treated with medicines if diet and exercise are not working.  . If you smoke, find out from your health care provider how to quit. If you do not use tobacco, please do not start.  . If you choose to drink alcohol, please do not consume more than 2 drinks per day. One drink is considered to be 12 ounces (355 mL) of beer, 5 ounces (148 mL) of wine, or 1.5 ounces (44 mL) of liquor.  .  If you are 39-4 years old, ask your health care provider if you should take aspirin to prevent strokes.  . Use sunscreen. Apply sunscreen liberally and repeatedly throughout the day. You should seek shade when your shadow is shorter than you. Protect yourself by wearing long sleeves, pants, a wide-brimmed hat, and sunglasses year round, whenever you are outdoors.  . Once a month, do a whole body skin exam, using a mirror to look at the skin on your back. Tell your health care provider of new moles, moles that have irregular borders, moles that are larger than a pencil eraser, or moles that have changed in shape or color.

## 2016-11-25 NOTE — Progress Notes (Signed)
PCP notes:   Health maintenance:  No gaps identified.   Abnormal screenings:   Hearing - failed  Patient concerns:   None  Nurse concerns:  None  Next PCP appt:   12/07/16 @ 1400

## 2016-11-30 ENCOUNTER — Ambulatory Visit
Admission: RE | Admit: 2016-11-30 | Discharge: 2016-11-30 | Disposition: A | Discharge status: Home / Self Care | Payer: PPO | Source: Ambulatory Visit | Attending: Emergency Medicine | Admitting: Emergency Medicine

## 2016-11-30 DIAGNOSIS — R911 Solitary pulmonary nodule: Secondary | ICD-10-CM | POA: Diagnosis not present

## 2016-11-30 DIAGNOSIS — R918 Other nonspecific abnormal finding of lung field: Secondary | ICD-10-CM | POA: Diagnosis not present

## 2016-11-30 DIAGNOSIS — I2510 Atherosclerotic heart disease of native coronary artery without angina pectoris: Secondary | ICD-10-CM | POA: Insufficient documentation

## 2016-11-30 DIAGNOSIS — I700 Atherosclerosis of aorta: Secondary | ICD-10-CM | POA: Insufficient documentation

## 2016-11-30 DIAGNOSIS — J449 Chronic obstructive pulmonary disease, unspecified: Secondary | ICD-10-CM | POA: Insufficient documentation

## 2016-12-04 ENCOUNTER — Telehealth: Payer: Self-pay | Admitting: Cardiovascular Disease

## 2016-12-04 NOTE — Telephone Encounter (Signed)
nEW MESSAGE  Pt c/o medication issue:  1. Name of Medication: lasix   2. How are you currently taking this medication (dosage and times per day)? '20mg'$    3. Are you having a reaction (difficulty breathing--STAT)? Swelling   4. What is your medication issue? Pt niece would like to know since pt medication has been changed previously would it be ok to change the dosage to stop the swelling. Please call back to discuss

## 2016-12-04 NOTE — Telephone Encounter (Signed)
I left a message for Almyra Free to call. I advised if the patient is having issues over the weekend, to please call the office # and she will be directed to the physician on call, otherwise, please call back on Monday.

## 2016-12-05 NOTE — Telephone Encounter (Signed)
Niece called to state that patient is gaining weight when she only takes one lasix a day. She is on alternating doses of 40 mg one day and 20 mg one day. Weight and edema occur with lower dose. I advised that she stay on lasix 40 mg daily for the next two days, and then call office on Monday. She will need an appointment or triage evaluation.

## 2016-12-07 ENCOUNTER — Ambulatory Visit (INDEPENDENT_AMBULATORY_CARE_PROVIDER_SITE_OTHER): Payer: PPO | Admitting: Family Medicine

## 2016-12-07 ENCOUNTER — Telehealth: Payer: Self-pay | Admitting: Cardiovascular Disease

## 2016-12-07 ENCOUNTER — Encounter: Payer: Self-pay | Admitting: Family Medicine

## 2016-12-07 VITALS — BP 106/70 | HR 63 | Temp 97.8°F | Ht 65.75 in | Wt 118.5 lb

## 2016-12-07 DIAGNOSIS — I10 Essential (primary) hypertension: Secondary | ICD-10-CM

## 2016-12-07 DIAGNOSIS — Z66 Do not resuscitate: Secondary | ICD-10-CM | POA: Insufficient documentation

## 2016-12-07 DIAGNOSIS — I5042 Chronic combined systolic (congestive) and diastolic (congestive) heart failure: Secondary | ICD-10-CM

## 2016-12-07 DIAGNOSIS — I255 Ischemic cardiomyopathy: Secondary | ICD-10-CM

## 2016-12-07 DIAGNOSIS — N183 Chronic kidney disease, stage 3 unspecified: Secondary | ICD-10-CM

## 2016-12-07 DIAGNOSIS — E785 Hyperlipidemia, unspecified: Secondary | ICD-10-CM

## 2016-12-07 DIAGNOSIS — Z7189 Other specified counseling: Secondary | ICD-10-CM

## 2016-12-07 DIAGNOSIS — E559 Vitamin D deficiency, unspecified: Secondary | ICD-10-CM

## 2016-12-07 DIAGNOSIS — I2510 Atherosclerotic heart disease of native coronary artery without angina pectoris: Secondary | ICD-10-CM

## 2016-12-07 DIAGNOSIS — M810 Age-related osteoporosis without current pathological fracture: Secondary | ICD-10-CM

## 2016-12-07 DIAGNOSIS — R7303 Prediabetes: Secondary | ICD-10-CM

## 2016-12-07 DIAGNOSIS — Z0000 Encounter for general adult medical examination without abnormal findings: Secondary | ICD-10-CM | POA: Diagnosis not present

## 2016-12-07 DIAGNOSIS — J431 Panlobular emphysema: Secondary | ICD-10-CM

## 2016-12-07 MED ORDER — DENOSUMAB 60 MG/ML ~~LOC~~ SOLN
60.0000 mg | Freq: Once | SUBCUTANEOUS | Status: AC
  Administered 2016-12-07: 60 mg via SUBCUTANEOUS

## 2016-12-07 NOTE — Assessment & Plan Note (Signed)
Chronic, severe. Appreciate pulm care of patient.

## 2016-12-07 NOTE — Assessment & Plan Note (Signed)
Preventative protocols reviewed and updated unless pt declined. Discussed healthy diet and lifestyle.  

## 2016-12-07 NOTE — Assessment & Plan Note (Signed)
Chronic, stable. Continue lasix dose anticipate will need '40mg'$  MWF and '20mg'$  on other days. Pt awaiting cards response.

## 2016-12-07 NOTE — Progress Notes (Signed)
BP 106/70 (BP Location: Right Arm, Patient Position: Sitting, Cuff Size: Normal)   Pulse 63   Temp 97.8 F (36.6 C) (Oral)   Ht 5' 5.75" (1.67 m)   Wt 118 lb 8 oz (53.8 kg)   SpO2 94%   BMI 19.27 kg/m    CC: CPE Subjective:    Patient ID: [name], female    DOB: [birth year], 76 y.o.   MRN: [id number]  HPI: [name] is a 76 y.o. female presenting on 12/07/2016 for Annual Exam   Saw Katha Cabal last week for medicare wellness visit. Note reviewed.   Has appt tomorrow with pulm Dr Lamonte Sakai.  Pedal edema and chest congestion over the past week, this improved with increased lasix dose to '40mg'$  daily. Current dose is '40mg'$  MWF, '20mg'$  other days.   Preventative: Colon cancer screening - does not want colonoscopy - yearly stool kits normal last 04/2016, will rpt today. H/o lung cancer - s/p resection 2008, followed by Dr Earlie Server.  Breast cancer screening - mammo 05/2016 birads 1 Well woman exam - s/p partial hysterectomy for irregular periods, ovaries remain. Denies any concerns or skin changes or pelvic fullness.  DEXA scan - 04/2015 osteoporosis T -3.4 (femur), -3.6 (forearm). Currently on prolia. Actonel caused bone pain. Takes calcium/vit d x2 and vit D 2000 x1. Activity limited by dyspnea.  Flu shot yearly Tetanus shot - unsure Pneumovax 2011, prevnar 2016 Zostavax - 2013  Shingrix - will check with pharmacy about shingrix.  Advanced directive discussion - Son or niece are HCPOA. She desires DNR. Will bring [place] copy.  Seat belt use discussed Sunscreen use discussed, no changing moles on skin Quit smoking 2004 Alcohol - none  The patient is a widow and has one son and one dog.  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 She does not drink alcohol.  Ed: HS Activity: no regular exercise - activity limited by dyspnea Diet: some water, fruits/vegetables daily  Relevant past medical, surgical, family and social history reviewed and updated as  indicated. Interim medical history since our last visit reviewed. Allergies and medications reviewed and updated. Outpatient Medications Prior to Visit  Medication Sig Dispense Refill  . albuterol (PROAIR HFA) 108 (90 Base) MCG/ACT inhaler Inhale 2 puffs into the lungs every 4 (four) hours as needed for wheezing or shortness of breath. 1 Inhaler 1  . albuterol (PROVENTIL) (2.5 MG/3ML) 0.083% nebulizer solution Take 3 mLs (2.5 mg total) by nebulization every 4 (four) hours as needed for wheezing or shortness of breath. 150 mL 5  . aspirin EC 81 MG tablet Take 1 tablet (81 mg total) by mouth daily.    . Calcium Carbonate-Vitamin D (CALCIUM-VITAMIN D) 500-200 MG-UNIT per tablet Take 1 tablet by mouth 2 (two) times daily.     . Cholecalciferol (VITAMIN D) 2000 UNITS CAPS Take 1 capsule by mouth daily.    . clopidogrel (PLAVIX) 75 MG tablet Take 1 tablet (75 mg total) by mouth daily. 30 tablet 12  . denosumab (PROLIA) 60 MG/ML SOLN injection Inject 60 mg into the skin every 6 (six) months. Administer in upper arm, thigh, or abdomen 60 mL 1  . fluticasone (FLONASE) 50 MCG/ACT nasal spray Place 2 sprays into both nostrils daily. 16 g 5  . furosemide (LASIX) 20 MG tablet Take 2 tablets by mouth on Monday, Wednesday and Friday. One tablet by mouth all other days. 135 tablet 3  . isosorbide  mononitrate (IMDUR) 30 MG 24 hr tablet TAKE 1 TABLET BY MOUTH DAILY 30 tablet 10  . loratadine (CLARITIN) 10 MG tablet Take 10 mg by mouth daily.    Marland Kitchen losartan (COZAAR) 25 MG tablet Take 0.5 tablets (12.5 mg total) by mouth daily. 30 tablet 6  . LOTEMAX 0.5 % ophthalmic suspension Place 1 drop into both eyes daily.     . nitroGLYCERIN (NITROSTAT) 0.4 MG SL tablet Place 1 tablet (0.4 mg total) under the tongue every 5 (five) minutes as needed for chest pain. 25 tablet 3  . pantoprazole (PROTONIX) 40 MG tablet Take 1 tablet (40 mg total) by mouth daily. 30 tablet 11  . simvastatin (ZOCOR) 40 MG tablet TAKE ONE TABLET BY  MOUTH EVERY NIGHT AT BEDTIME 30 tablet 6  . spironolactone (ALDACTONE) 25 MG tablet TAKE 1/2 TABLET BY MOUTH ONCE DAILY 30 tablet 11   No facility-administered medications prior to visit.      Per HPI unless specifically indicated in ROS section below Review of Systems  Constitutional: Negative for activity change, appetite change, chills, fatigue, fever and unexpected weight change.  HENT: Negative for hearing loss.   Eyes: Negative for visual disturbance.  Respiratory: Positive for cough, chest tightness, shortness of breath and wheezing.   Cardiovascular: Positive for leg swelling. Negative for chest pain and palpitations.  Gastrointestinal: Negative for abdominal distention, abdominal pain, blood in stool, constipation, diarrhea, nausea and vomiting.  Genitourinary: Negative for difficulty urinating and hematuria.  Musculoskeletal: Negative for arthralgias, myalgias and neck pain.  Skin: Negative for rash.  Neurological: Positive for dizziness. Negative for seizures, syncope and headaches.  Hematological: Negative for adenopathy. Bruises/bleeds easily.  Psychiatric/Behavioral: Negative for dysphoric mood. The patient is not nervous/anxious.        Objective:    BP 106/70 (BP Location: Right Arm, Patient Position: Sitting, Cuff Size: Normal)   Pulse 63   Temp 97.8 F (36.6 C) (Oral)   Ht 5' 5.75" (1.67 m)   Wt 118 lb 8 oz (53.8 kg)   SpO2 94%   BMI 19.27 kg/m   Wt Readings from Last 3 Encounters:  12/07/16 118 lb 8 oz (53.8 kg)  11/25/16 123 lb 12 oz (56.1 kg)  11/16/16 127 lb 1.9 oz (57.7 kg)    Physical Exam  Constitutional: She is oriented to person, place, and time. She appears well-developed and well-nourished. No distress.  HENT:  Head: Normocephalic and atraumatic.  Right Ear: Hearing, tympanic membrane, external ear and ear canal normal.  Left Ear: Hearing, tympanic membrane, external ear and ear canal normal.  Nose: Nose normal.  Mouth/Throat: Uvula is  midline, oropharynx is clear and moist and mucous membranes are normal. No oropharyngeal exudate, posterior oropharyngeal edema or posterior oropharyngeal erythema.  Eyes: Conjunctivae and EOM are normal. Pupils are equal, round, and reactive to light. No scleral icterus.  Neck: Normal range of motion. Neck supple. Carotid bruit is not present. No thyromegaly present.  Cardiovascular: Normal rate, regular rhythm, normal heart sounds and intact distal pulses.   No murmur heard. Pulses:      Radial pulses are 2+ on the right side, and 2+ on the left side.  Pulmonary/Chest: Effort normal and breath sounds normal. No respiratory distress. She has no wheezes. She has no rales.  Abdominal: Soft. Bowel sounds are normal. She exhibits no distension and no mass. There is no tenderness. There is no rebound and no guarding.  Musculoskeletal: Normal range of motion. She exhibits no edema.  Lymphadenopathy:  She has no cervical adenopathy.  Neurological: She is alert and oriented to person, place, and time.  CN grossly intact, station and gait intact  Skin: Skin is warm and dry. No rash noted.  Psychiatric: She has a normal mood and affect. Her behavior is normal. Judgment and thought content normal.  Nursing note and vitals reviewed.  Results for orders placed or performed in visit on 11/25/16  Hemoglobin A1c  Result Value Ref Range   Hgb A1c MFr Bld 6.1 4.6 - 6.5 %  VITAMIN D 25 Hydroxy (Vit-D Deficiency, Fractures)  Result Value Ref Range   VITD 81.46 30.00 - 100.00 ng/mL  Renal function panel  Result Value Ref Range   Sodium 138 135 - 145 mEq/L   Potassium 4.0 3.5 - 5.1 mEq/L   Chloride 103 96 - 112 mEq/L   CO2 29 19 - 32 mEq/L   Calcium 9.6 8.4 - 10.5 mg/dL   Albumin 4.1 3.5 - 5.2 g/dL   BUN 22 6 - 23 mg/dL   Creatinine, Ser 1.08 0.40 - 1.20 mg/dL   Glucose, Bld 82 70 - 99 mg/dL   Phosphorus 3.2 2.3 - 4.6 mg/dL   GFR 52.43 (L) >60.00 mL/min  LDL Cholesterol, Direct  Result Value Ref  Range   Direct LDL 50.0 mg/dL      Assessment & Plan:   Problem List Items Addressed This Visit    Advanced care planning/counseling discussion    Advanced directive discussion - Son or niece are HCPOA. She desires DNR. Will bring [place] copy.       CAD, NATIVE VESSEL    Appreciate cards care of patient.       Chronic combined systolic and diastolic heart failure (HCC)    Chronic, stable. Continue lasix dose anticipate will need '40mg'$  MWF and '20mg'$  on other days. Pt awaiting cards response.       CKD (chronic kidney disease) stage 3, GFR 30-59 ml/min    Mild      COPD (chronic obstructive pulmonary disease) (HCC)    Chronic, severe. Appreciate pulm care of patient.       DNR (do not resuscitate)    Reviewed with patient that this is still desired. It is so desired, DNR form was filled out and handed to patient today to keep visible at home.       Essential hypertension    Chronic, a bit low. Will need to monitor on higher lasix dose. Encouraged good hydration status.      Health maintenance examination - Primary    Preventative protocols reviewed and updated unless pt declined. Discussed healthy diet and lifestyle.       HLD (hyperlipidemia)    Chronic, stable. Continue current regimen.      Ischemic cardiomyopathy    Appreciate cards care of patient.       Osteoporosis    prolia shot today. Pt tolerating well. Discussed will be due for rpt DEXA 04/2017.       Prediabetes    Reviewed with patient, encouraged continued close monitoring of sugars in diet.       Vitamin D insufficiency    Continue vit d replacement          Follow up plan: Return in about 6 months (around 06/09/2017) for follow up visit.  Ria Bush, MD

## 2016-12-07 NOTE — Telephone Encounter (Signed)
pt's niece calling per over the weekend issue, pt was told to increase Lasix to two on Sat (already takes two on Fridays) two on Sun and two today, she is to go back to one tomorrow-told to call today for appt or triage-she is doing well in this dose-wants to know if appt needed or can stay on this increased dose? Also has PCP appt today at 2p-if they need to do anything and Pulmonary tomorrow-pls advise Reola Calkins [phone]

## 2016-12-07 NOTE — Assessment & Plan Note (Signed)
Reviewed with patient, encouraged continued close monitoring of sugars in diet.

## 2016-12-07 NOTE — Assessment & Plan Note (Signed)
prolia shot today. Pt tolerating well. Discussed will be due for rpt DEXA 04/2017.

## 2016-12-07 NOTE — Telephone Encounter (Signed)
Follow up    Pt niece is calling to follow up about this.

## 2016-12-07 NOTE — Assessment & Plan Note (Signed)
Mild.

## 2016-12-07 NOTE — Assessment & Plan Note (Signed)
Continue vit d replacement

## 2016-12-07 NOTE — Progress Notes (Signed)
Pre visit review using our clinic review tool, if applicable. No additional management support is needed unless otherwise documented below in the visit note. 

## 2016-12-07 NOTE — Assessment & Plan Note (Signed)
Reviewed with patient that this is still desired. It is so desired, DNR form was filled out and handed to patient today to keep visible at home.

## 2016-12-07 NOTE — Assessment & Plan Note (Signed)
Appreciate cards care of patient.

## 2016-12-07 NOTE — Telephone Encounter (Signed)
Agree with plan as outlined.

## 2016-12-07 NOTE — Telephone Encounter (Signed)
I spoke with Almyra Free about the pt and advised that I reviewed PCP note from today and on call note from the weekend.  The pt's weight has decreased by almost 10 lbs in less than a month.  Per Almyra Free the pt complains of no appetite and food not having a taste.  The pt eats very little.  Per OV note the pt has leg edema, cough, chest tightness, SOB and wheezing.  The pt is scheduled to see Dr Lamonte Sakai tomorrow.  At this time I advised Almyra Free to have the pt continue taking Lasix '40mg'$  daily.  The pt's BP was on the lower side of normal and per Almyra Free the pt did complain of dizziness yesterday.  I asked Almyra Free to have the pt hold off on taking her losartan tomorrow morning so that we can see how her BP responds. We will await assessment from Dr Lamonte Sakai and I will forward this message to Dr Burt Knack to review and make further recommendations.

## 2016-12-07 NOTE — Assessment & Plan Note (Addendum)
Chronic, stable. Continue current regimen. 

## 2016-12-07 NOTE — Assessment & Plan Note (Signed)
Chronic, a bit low. Will need to monitor on higher lasix dose. Encouraged good hydration status.

## 2016-12-07 NOTE — Patient Instructions (Addendum)
Pass by lab to pick up stool kit. Check with pharmacy about shingrix new 2 shot shingles series.  Bring me copy of your advanced directive.  DNR form provided today.  prolia shot today. You are doing well today  Return as needed or in 6 months for follow up visit.   Health Maintenance, Female Adopting a healthy lifestyle and getting preventive care can go a long way to promote health and wellness. Talk with your health care provider about what schedule of regular examinations is right for you. This is a good chance for you to check in with your provider about disease prevention and staying healthy. In between checkups, there are plenty of things you can do on your own. Experts have done a lot of research about which lifestyle changes and preventive measures are most likely to keep you healthy. Ask your health care provider for more information. Weight and diet Eat a healthy diet  Be sure to include plenty of vegetables, fruits, low-fat dairy products, and lean protein.  Do not eat a lot of foods high in solid fats, added sugars, or salt.  Get regular exercise. This is one of the most important things you can do for your health.  Most adults should exercise for at least 150 minutes each week. The exercise should increase your heart rate and make you sweat (moderate-intensity exercise).  Most adults should also do strengthening exercises at least twice a week. This is in addition to the moderate-intensity exercise. Maintain a healthy weight  Body mass index (BMI) is a measurement that can be used to identify possible weight problems. It estimates body fat based on height and weight. Your health care provider can help determine your BMI and help you achieve or maintain a healthy weight.  For females 32 years of age and older:  A BMI below 18.5 is considered underweight.  A BMI of 18.5 to 24.9 is normal.  A BMI of 25 to 29.9 is considered overweight.  A BMI of 30 and above is  considered obese. Watch levels of cholesterol and blood lipids  You should start having your blood tested for lipids and cholesterol at 76 years of age, then have this test every 5 years.  You may need to have your cholesterol levels checked more often if:  Your lipid or cholesterol levels are high.  You are older than 76 years of age.  You are at high risk for heart disease. Cancer screening Lung Cancer  Lung cancer screening is recommended for adults 70-41 years old who are at high risk for lung cancer because of a history of smoking.  A yearly low-dose CT scan of the lungs is recommended for people who:  Currently smoke.  Have quit within the past 15 years.  Have at least a 30-pack-year history of smoking. A pack year is smoking an average of one pack of cigarettes a day for 1 year.  Yearly screening should continue until it has been 15 years since you quit.  Yearly screening should stop if you develop a health problem that would prevent you from having lung cancer treatment. Breast Cancer  Practice breast self-awareness. This means understanding how your breasts normally appear and feel.  It also means doing regular breast self-exams. Let your health care provider know about any changes, no matter how small.  If you are in your 20s or 30s, you should have a clinical breast exam (CBE) by a health care provider every 1-3 years as part of a  regular health exam.  If you are 40 or older, have a CBE every year. Also consider having a breast X-ray (mammogram) every year.  If you have a family history of breast cancer, talk to your health care provider about genetic screening.  If you are at high risk for breast cancer, talk to your health care provider about having an MRI and a mammogram every year.  Breast cancer gene (BRCA) assessment is recommended for women who have family members with BRCA-related cancers. BRCA-related cancers  include:  Breast.  Ovarian.  Tubal.  Peritoneal cancers.  Results of the assessment will determine the need for genetic counseling and BRCA1 and BRCA2 testing. Cervical Cancer  Your health care provider may recommend that you be screened regularly for cancer of the pelvic organs (ovaries, uterus, and vagina). This screening involves a pelvic examination, including checking for microscopic changes to the surface of your cervix (Pap test). You may be encouraged to have this screening done every 3 years, beginning at age 21.  For women ages 30-65, health care providers may recommend pelvic exams and Pap testing every 3 years, or they may recommend the Pap and pelvic exam, combined with testing for human papilloma virus (HPV), every 5 years. Some types of HPV increase your risk of cervical cancer. Testing for HPV may also be done on women of any age with unclear Pap test results.  Other health care providers may not recommend any screening for nonpregnant women who are considered low risk for pelvic cancer and who do not have symptoms. Ask your health care provider if a screening pelvic exam is right for you.  If you have had past treatment for cervical cancer or a condition that could lead to cancer, you need Pap tests and screening for cancer for at least 20 years after your treatment. If Pap tests have been discontinued, your risk factors (such as having a new sexual partner) need to be reassessed to determine if screening should resume. Some women have medical problems that increase the chance of getting cervical cancer. In these cases, your health care provider may recommend more frequent screening and Pap tests. Colorectal Cancer  This type of cancer can be detected and often prevented.  Routine colorectal cancer screening usually begins at 76 years of age and continues through 75 years of age.  Your health care provider may recommend screening at an earlier age if you have risk factors  for colon cancer.  Your health care provider may also recommend using home test kits to check for hidden blood in the stool.  A small camera at the end of a tube can be used to examine your colon directly (sigmoidoscopy or colonoscopy). This is done to check for the earliest forms of colorectal cancer.  Routine screening usually begins at age 50.  Direct examination of the colon should be repeated every 5-10 years through 75 years of age. However, you may need to be screened more often if early forms of precancerous polyps or small growths are found. Skin Cancer  Check your skin from head to toe regularly.  Tell your health care provider about any new moles or changes in moles, especially if there is a change in a mole's shape or color.  Also tell your health care provider if you have a mole that is larger than the size of a pencil eraser.  Always use sunscreen. Apply sunscreen liberally and repeatedly throughout the day.  Protect yourself by wearing long sleeves, pants, a wide-brimmed   hat, and sunglasses whenever you are outside. Heart disease, diabetes, and high blood pressure  High blood pressure causes heart disease and increases the risk of stroke. High blood pressure is more likely to develop in:  People who have blood pressure in the high end of the normal range (130-139/85-89 mm Hg).  People who are overweight or obese.  People who are African American.  If you are 67-43 years of age, have your blood pressure checked every 3-5 years. If you are 53 years of age or older, have your blood pressure checked every year. You should have your blood pressure measured twice-once when you are at a hospital or clinic, and once when you are not at a hospital or clinic. Record the average of the two measurements. To check your blood pressure when you are not at a hospital or clinic, you can use:  An automated blood pressure machine at a pharmacy.  A home blood pressure monitor.  If you  are between 54 years and 75 years old, ask your health care provider if you should take aspirin to prevent strokes.  Have regular diabetes screenings. This involves taking a blood sample to check your fasting blood sugar level.  If you are at a normal weight and have a low risk for diabetes, have this test once every three years after 76 years of age.  If you are overweight and have a high risk for diabetes, consider being tested at a younger age or more often. Preventing infection Hepatitis B  If you have a higher risk for hepatitis B, you should be screened for this virus. You are considered at high risk for hepatitis B if:  You were born in a country where hepatitis B is common. Ask your health care provider which countries are considered high risk.  Your parents were born in a high-risk country, and you have not been immunized against hepatitis B (hepatitis B vaccine).  You have HIV or AIDS.  You use needles to inject street drugs.  You live with someone who has hepatitis B.  You have had sex with someone who has hepatitis B.  You get hemodialysis treatment.  You take certain medicines for conditions, including cancer, organ transplantation, and autoimmune conditions. Hepatitis C  Blood testing is recommended for:  Everyone born from [birth year] through 1965.  Anyone with known risk factors for hepatitis C. Sexually transmitted infections (STIs)  You should be screened for sexually transmitted infections (STIs) including gonorrhea and chlamydia if:  You are sexually active and are younger than 76 years of age.  You are older than 76 years of age and your health care provider tells you that you are at risk for this type of infection.  Your sexual activity has changed since you were last screened and you are at an increased risk for chlamydia or gonorrhea. Ask your health care provider if you are at risk.  If you do not have HIV, but are at risk, it may be recommended that you  take a prescription medicine daily to prevent HIV infection. This is called pre-exposure prophylaxis (PrEP). You are considered at risk if:  You are sexually active and do not regularly use condoms or know the HIV status of your partner(s).  You take drugs by injection.  You are sexually active with a partner who has HIV. Talk with your health care provider about whether you are at high risk of being infected with HIV. If you choose to begin PrEP, you should first  be tested for HIV. You should then be tested every 3 months for as long as you are taking PrEP. Pregnancy  If you are premenopausal and you may become pregnant, ask your health care provider about preconception counseling.  If you may become pregnant, take 400 to 800 micrograms (mcg) of folic acid every day.  If you want to prevent pregnancy, talk to your health care provider about birth control (contraception). Osteoporosis and menopause  Osteoporosis is a disease in which the bones lose minerals and strength with aging. This can result in serious bone fractures. Your risk for osteoporosis can be identified using a bone density scan.  If you are 36 years of age or older, or if you are at risk for osteoporosis and fractures, ask your health care provider if you should be screened.  Ask your health care provider whether you should take a calcium or vitamin D supplement to lower your risk for osteoporosis.  Menopause may have certain physical symptoms and risks.  Hormone replacement therapy may reduce some of these symptoms and risks. Talk to your health care provider about whether hormone replacement therapy is right for you. Follow these instructions at home:  Schedule regular health, dental, and eye exams.  Stay current with your immunizations.  Do not use any tobacco products including cigarettes, chewing tobacco, or electronic cigarettes.  If you are pregnant, do not drink alcohol.  If you are breastfeeding, limit  how much and how often you drink alcohol.  Limit alcohol intake to no more than 1 drink per day for nonpregnant women. One drink equals 12 ounces of beer, 5 ounces of wine, or 1 ounces of hard liquor.  Do not use street drugs.  Do not share needles.  Ask your health care provider for help if you need support or information about quitting drugs.  Tell your health care provider if you often feel depressed.  Tell your health care provider if you have ever been abused or do not feel safe at home. This information is not intended to replace advice given to you by your health care provider. Make sure you discuss any questions you have with your health care provider. Document Released: 01/26/2011 Document Revised: 12/19/2015 Document Reviewed: 04/16/2015 Elsevier Interactive Patient Education  2017 [employer].

## 2016-12-07 NOTE — Addendum Note (Signed)
Addended by: Tammi Sou on: 12/07/2016 03:06 PM   Modules accepted: Orders

## 2016-12-07 NOTE — Assessment & Plan Note (Signed)
Advanced directive discussion - Son or niece are HCPOA. She desires DNR. Will bring us copy.  

## 2016-12-08 ENCOUNTER — Ambulatory Visit (INDEPENDENT_AMBULATORY_CARE_PROVIDER_SITE_OTHER): Payer: PPO | Admitting: Emergency Medicine

## 2016-12-08 ENCOUNTER — Encounter: Payer: Self-pay | Admitting: Emergency Medicine

## 2016-12-08 DIAGNOSIS — J301 Allergic rhinitis due to pollen: Secondary | ICD-10-CM | POA: Diagnosis not present

## 2016-12-08 DIAGNOSIS — J431 Panlobular emphysema: Secondary | ICD-10-CM

## 2016-12-08 DIAGNOSIS — J209 Acute bronchitis, unspecified: Secondary | ICD-10-CM | POA: Diagnosis not present

## 2016-12-08 DIAGNOSIS — R918 Other nonspecific abnormal finding of lung field: Secondary | ICD-10-CM | POA: Diagnosis not present

## 2016-12-08 MED ORDER — AZITHROMYCIN 250 MG PO TABS: ORAL_TABLET | ORAL | 0 refills | Status: DC

## 2016-12-08 NOTE — Addendum Note (Signed)
Addended by: Jannette Spanner on: 12/08/2016 03:40 PM   Modules accepted: Orders

## 2016-12-08 NOTE — Telephone Encounter (Signed)
Follow up    [name] is calling about pt.

## 2016-12-08 NOTE — Telephone Encounter (Signed)
I think it would be best to keep her on furosemide 40 mg daily as long as she tolerates. Agree with plan as outlined. thanks

## 2016-12-08 NOTE — Addendum Note (Signed)
Addended by: Jannette Spanner on: 12/08/2016 03:45 PM   Modules accepted: Orders

## 2016-12-08 NOTE — Progress Notes (Signed)
Subjective:    Patient ID: [name], female    DOB: Jul 20, 1941, 76 y.o.   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 She has been having dyspnea for > 1 year, some cough that is productive of white mucous. She has hoarse voice frequently, not necessarily related to cough. Not clear that it relates in time to symbicort.    ROV 11/27/15 -- patient with a history of COPD, adenoCA of the lung, and pulmonary nodules that we have followed by CT scan of the chest. She had a PET scan in November 2016 as above. Her most recent CT scan of the chest was 11/18/15 that I personally reviewed today. Her left lower lobe superior segmental nodule may have changed slightly in size and shape but appears to be associated with evolving atx. Also, it was not hypermetabolic on PET from 12/52.  She has albuterol to use prn, uses it a few times a week.   ROV 07/31/16 -- This follow-up visit for patient with a history of COPD, lobe superior segmentectomy for adenocarcinoma of the lung in 2008.  We have been following a left lower lobe superior segmental nodule by serial CT scans. PET negative in November 2016. Repeat CT scan of the chest in November 2017 showed that the nodule is stable. There were some additional right middle lobe nodular opacities noted likely most consistent with chronic infectious process. She has been seen and treated for acute exacerbations twice since our last visit.  She is on budesonide bid, duoneb bid  ROV 11/03/16 -- Pt with COPD, adenoCA s/p RLL sup seg resection 2008. Have been following a LLL nodule by serial Ct scan, last was 11/17.  She is on budesonide nebs bid, DuoNeb tid. She is on flonase qd. She is having hoarseness again, more problematic last few weeks. ? Allergy season.   ROV 12/08/16 -- This follow-up visit for 76 year old woman with a history of  adenocarcinoma of the lung status post right lower lobe superior segmental resection in 2008, COPD, a stable left lower lobe nodule that we have followed by serial CT scans of the chest. She underwent a repeat CT scan on 11/30/16 that I have personally reviewed. This shows a persistent peripheral right lower lobe nodular lesion and a slowly enlarging left lower lobe posterior nodule, now measuring approximately 16 x 22 mm.  She reports some hoarse voice, more mucous. She is using DuoNeb tid, + prn albuterol. She is coughing up dark mucous, brown.  Review of Systems     As per history of present illness    Objective:   Physical Exam Vitals:   12/08/16 1449  BP: 140/90  Pulse: 77  SpO2: 96%  Weight: 120 lb 12.8 oz (54.8 kg)  Height: '5\' 6"'$  (1.676 m)    Gen: Pleasant, elderly in no distress,  normal affect  ENT: No lesions,  mouth clear,  oropharynx clear, no postnasal drip, hoarse voice  Neck: No JVD, no TMG, no carotid bruits  Lungs: No use of accessory muscles, very distant, clear without rales or rhonchi  Cardiovascular: RRR, heart sounds normal, no murmur or gallops, no peripheral edema  Musculoskeletal: No deformities, no cyanosis or clubbing  Neuro: alert, non focal  Skin: Warm, no lesions or rashes   CT chest 11/30/16 --  COMPARISON:  Chest CT 06/22/2016.  FINDINGS: Cardiovascular: Heart size is normal. There is no significant pericardial fluid, thickening or pericardial calcification. There is aortic atherosclerosis, as well as atherosclerosis of the great vessels of the mediastinum and the coronary arteries, including calcified atherosclerotic plaque in the left main, left anterior descending, left circumflex and  right coronary arteries. Left circumflex coronary artery stent.  Mediastinum/Nodes: No pathologically enlarged mediastinal or hilar lymph nodes. Please note that accurate exclusion of hilar adenopathy is limited on noncontrast CT scans. Esophagus is unremarkable in appearance. No axillary lymphadenopathy.  Lungs/Pleura: Several nodular appearing areas of architectural distortion are again noted in the lungs bilaterally. The largest and most concerning of these is in the superior segment of the left lower lobe (axial image 62 of series 3) which currently measures 1.6 x 2.2 cm (previously 1.5 cm on 06/22/2016), and in the posterior aspect of the right lower lobe (image 110 of series 3) measuring 2.5 x 1.0 cm (previously 1.9 cm). Previously noted nodular areas of architectural distortion in the right middle lobe have resolved compared to the prior study, indicative of a benign infectious or inflammatory etiology on the prior study. Nodular areas of pleural-parenchymal thickening are again noted in the apices of the lungs bilaterally, most compatible with chronic post infectious or inflammatory scarring. Suture line in the right lower lobe, compatible with prior wedge resection. No acute consolidative airspace disease. No pleural effusions. Mild diffuse bronchial wall thickening with moderate centrilobular and paraseptal emphysema.  Upper Abdomen: Aortic atherosclerosis.  Musculoskeletal: There are no aggressive appearing lytic or blastic lesions noted in the visualized portions of the skeleton.  IMPRESSION: 1. Resolution of previously noted right middle lobe pulmonary nodules, indicative of a benign infectious or inflammatory etiology on the prior study. 2. Slow progressive enlargement of 2 nodules in the superior segment of the left lower lobe and in the periphery of the right lower lobe when compared to numerous prior examinations. These findings remain concerning for  potential neoplasm. Further evaluation with PET-CT is suggested in the near future. 3. Diffuse bronchial wall thickening with moderate centrilobular and paraseptal emphysema; imaging findings compatible with the reported clinical history of COPD. 4. Aortic atherosclerosis, in addition to left main and 3 vessel coronary artery disease. Assessment for potential risk factor modification, dietary therapy or pharmacologic therapy may be warranted, if clinically indicated. 5. Additional incidental findings, as above      Assessment & Plan:  COPD (chronic obstructive pulmonary disease) ([place]) Please continue your DuoNeb three times a day.  Use albuterol nebulizer as needed for shortness of breath.  Please continue your loratadine and flonase as you are taking them  Please take azithromycin until  completely gone Follow with Dr Lamonte Sakai next available after the PET scan to review  Pulmonary nodules Increase in size of the left lower lobe nodule, right lower lobe nodule appears to be the most part stable. I believe we need to repeat her PET scan. She likely needs a needle biopsy of the left lower lobe lesion.  Your CT scan showed that your left pulmonary nodule has increased slightly in size. The right nodule has not changed significantly. We will perform a repeat PET scan to assess the nodules, assist [place] with our next steps.   Acute bronchitis Purulent sputum. I will treat her with azithromycin. Continue her current bronchodilators  Allergic rhinitis Continue current allergy regimen.  Baltazar Apo, MD, PhD 12/08/2016, 3:35 PM [facility] [phone] or if no answer [phone]

## 2016-12-08 NOTE — Assessment & Plan Note (Signed)
Purulent sputum. I will treat her with azithromycin. Continue her current bronchodilators

## 2016-12-08 NOTE — Patient Instructions (Addendum)
Your CT scan showed that your left pulmonary nodule has increased slightly in size. The right nodule has not changed significantly. We will perform a repeat PET scan to assess the nodules, assist [place] with our next steps.  Please continue your DuoNeb three times a day.  Use albuterol nebulizer as needed for shortness of breath.  Please continue your loratadine and flonase as you are taking them  Please take azithromycin until completely gone Follow with Dr Lamonte Sakai next available after the PET scan to review

## 2016-12-08 NOTE — Assessment & Plan Note (Signed)
Please continue your DuoNeb three times a day.  Use albuterol nebulizer as needed for shortness of breath.  Please continue your loratadine and flonase as you are taking them  Please take azithromycin until completely gone Follow with Dr Lamonte Sakai next available after the PET scan to review

## 2016-12-08 NOTE — Telephone Encounter (Signed)
I spoke with Almyra Free and reviewed the note from Dr Lamonte Sakai.  BP today was 140/90, weight stable at 120 lbs and no edema noted. He diagnosed pt with bronchitis and azithromycin was started. At this time the pt will continue furosemide '40mg'$  daily, monitor BP at home and if BP is greater than 140/90 tomorrow then pt can take losartan. I will touch base with Almyra Free on Thursday to see how the pt is feeling.  I will forward this message to Dr Burt Knack for review and to determine if new maintenance dose of Furosemide should be '40mg'$  daily.

## 2016-12-08 NOTE — Addendum Note (Signed)
Addended by: Jannette Spanner on: 12/08/2016 03:47 PM   Modules accepted: Orders

## 2016-12-08 NOTE — Assessment & Plan Note (Addendum)
Continue current allergy regimen 

## 2016-12-08 NOTE — Assessment & Plan Note (Signed)
Increase in size of the left lower lobe nodule, right lower lobe nodule appears to be the most part stable. I believe we need to repeat her PET scan. She likely needs a needle biopsy of the left lower lobe lesion.  Your CT scan showed that your left pulmonary nodule has increased slightly in size. The right nodule has not changed significantly. We will perform a repeat PET scan to assess the nodules, assist [place] with our next steps.

## 2016-12-10 MED ORDER — FUROSEMIDE 20 MG PO TABS: ORAL_TABLET | ORAL | 3 refills | Status: DC

## 2016-12-10 NOTE — Telephone Encounter (Signed)
I spoke with Almyra Free and the pt does not have any swelling.  BP yesterday was 98/64 and 118/59, today 110/63 and the pt complains of weakness but SOB is improving.  Per Almyra Free the pt has not been drinking very much water and I reminded her that the pt needs to drink fluids through out the day to remain well hydrated. The pt will continue Furosemide '40mg'$  daily and will hold Losartan due to BP less than 140/90. Almyra Free is unsure of the pt's weight today.  I advised that the pt could be taking to much Furosemide which can make her weak and cause low BP. If the pt's weight continues to decrease then the pt can go back to Furosemide '20mg'$  daily except '40mg'$  M, W and F. Julie verbalized understanding and will contact the office with any additional questions or concerns.

## 2016-12-12 NOTE — Telephone Encounter (Signed)
Agree with instructions as outlined

## 2016-12-14 ENCOUNTER — Telehealth: Payer: Self-pay | Admitting: Emergency Medicine

## 2016-12-14 MED ORDER — LEVOFLOXACIN 500 MG PO TABS: 500.0000 mg | ORAL_TABLET | Freq: Every day | ORAL | 0 refills | Status: DC

## 2016-12-14 NOTE — Telephone Encounter (Signed)
Call in Levaquin 500 mg daily for 7 days.

## 2016-12-14 NOTE — Telephone Encounter (Signed)
Called spoke with patient's niece Almyra Free and advised of PM's recommendations Also recommended pt take probiotic like Align while taking the levaquin Almyra Free voiced her understanding - will contact the office if symptoms do not improve or if they worsen Rx already sent to pharmacy by PM Will sign and forward to [place] just as an [place]

## 2016-12-14 NOTE — Telephone Encounter (Signed)
Spoke with patient Niece Almyra Free - Completed Zpak abx on 12/12/16 and is still having brown nasty mucus.  Increased cough. Not sure if SOB is worse than usual.  Pt having to use her albuterol nebulizer to get some relief- patient rarely uses this.   Instructions 12/08/16 Your CT scan showed that your left pulmonary nodule has increased slightly in size. The right nodule has not changed significantly. We will perform a repeat PET scan to assess the nodules, assist [place] with our next steps.  Please continue your DuoNeb three times a day.  Use albuterol nebulizer as needed for shortness of breath.  Please continue your loratadine and flonase as you are taking them  Please take azithromycin until completely gone Follow with Dr Lamonte Sakai next available after the PET scan to review   Please advise Dr Vaughan Browner as Dr Lamonte Sakai is not available. Thanks.

## 2016-12-22 ENCOUNTER — Encounter
Admission: RE | Admit: 2016-12-22 | Discharge: 2016-12-22 | Disposition: A | Discharge status: Home / Self Care | Payer: PPO | Source: Ambulatory Visit | Attending: Emergency Medicine | Admitting: Emergency Medicine

## 2016-12-22 DIAGNOSIS — R918 Other nonspecific abnormal finding of lung field: Secondary | ICD-10-CM

## 2016-12-22 DIAGNOSIS — R928 Other abnormal and inconclusive findings on diagnostic imaging of breast: Secondary | ICD-10-CM | POA: Diagnosis not present

## 2016-12-22 LAB — GLUCOSE, CAPILLARY: Glucose-Capillary: 102 mg/dL — ABNORMAL HIGH (ref 65–99)

## 2016-12-22 MED ORDER — FLUDEOXYGLUCOSE F - 18 (FDG) INJECTION
11.7660 | Freq: Once | INTRAVENOUS | Status: AC | PRN
  Administered 2016-12-22: 11.766 via INTRAVENOUS

## 2016-12-26 DIAGNOSIS — T63484A Toxic effect of venom of other arthropod, undetermined, initial encounter: Secondary | ICD-10-CM | POA: Diagnosis not present

## 2016-12-28 ENCOUNTER — Ambulatory Visit (INDEPENDENT_AMBULATORY_CARE_PROVIDER_SITE_OTHER): Payer: PPO | Admitting: Emergency Medicine

## 2016-12-28 ENCOUNTER — Encounter: Payer: Self-pay | Admitting: Emergency Medicine

## 2016-12-28 VITALS — BP 104/64 | HR 67 | Ht 66.0 in | Wt 124.2 lb

## 2016-12-28 DIAGNOSIS — R911 Solitary pulmonary nodule: Secondary | ICD-10-CM | POA: Diagnosis not present

## 2016-12-28 MED ORDER — AEROCHAMBER MV MISC: 0 refills | Status: AC

## 2016-12-28 NOTE — Assessment & Plan Note (Signed)
Continue albuterol as needed. We will give her a spacer to help with delivery.

## 2016-12-28 NOTE — Progress Notes (Signed)
Subjective:    Patient ID: [name], female    DOB: [birth year], 76 y.o.   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 She has been having dyspnea for > 1 year, some cough that is productive of white mucous. She has hoarse voice frequently, not necessarily related to cough. Not clear that it relates in time to symbicort.    ROV 11/27/15 -- patient with a history of COPD, adenoCA of the lung, and pulmonary nodules that we have followed by CT scan of the chest. She had a PET scan in November 2016 as above. Her most recent CT scan of the chest was 11/18/15 that I personally reviewed today. Her left lower lobe superior segmental nodule may have changed slightly in size and shape but appears to be associated with evolving atx. Also, it was not hypermetabolic on PET from 00/92.  She has albuterol to use prn, uses it a few times a week.   ROV 07/31/16 -- This follow-up visit for patient with a history of COPD, lobe superior segmentectomy for adenocarcinoma of the lung in 2008.  We have been following a left lower lobe superior segmental nodule by serial CT scans. PET negative in November 2016. Repeat CT scan of the chest in November 2017 showed that the nodule is stable. There were some additional right middle lobe nodular opacities noted likely most consistent with chronic infectious process. She has been seen and treated for acute exacerbations twice since our last visit.  She is on budesonide bid, duoneb bid  ROV 11/03/16 -- Pt with COPD, adenoCA s/p RLL sup seg resection 2008. Have been following a LLL nodule by serial Ct scan, last was 11/17.  She is on budesonide nebs bid, DuoNeb tid. She is on flonase qd. She is having hoarseness again, more problematic last few weeks. ? Allergy season.   ROV 12/08/16 -- This follow-up visit for 77 year old woman with a history of  adenocarcinoma of the lung status post right lower lobe superior segmental resection in 2008, COPD, a stable left lower lobe nodule that we have followed by serial CT scans of the chest. She underwent a repeat CT scan on 11/30/16 that I have personally reviewed. This shows a persistent peripheral right lower lobe nodular lesion and a slowly enlarging left lower lobe posterior nodule, now measuring approximately 16 x 22 mm.  She reports some hoarse voice, more mucous. She is using DuoNeb tid, + prn albuterol. She is coughing up dark mucous, brown.        ROV 12/28/16 --   patient has a history of right lower lobe superior segmental resection in 2008 for adenocarcinoma of the lung. Also history of COPD. We have been following pulmonary nodules. On her most recent CT scan 11/30/16 there was an enlargement in her left lower lobe posterior nodule. This prompted [place] to perform a PET scan that was done on 12/22/16 that I have personally reviewed. This shows a slight increase in moderate hypermetabolism in both the left lower lobe and right lower lobe nodules corresponding with a very slow increase in size since 2016. Some mild hypermetabolism noted in the mediastinal lymph nodes as well as a right axillary node that is hypermetabolic, unclear significance.  Review of Systems     As per history of present illness    Objective:   Physical Exam Vitals:   12/28/16 1555  BP: 104/64  Pulse: 67  SpO2: 95%  Weight: 124 lb 3.2 oz (56.3 kg)  Height: 5\' 6"  (1.676 m)    Gen: Pleasant, elderly in no distress,  normal affect  ENT: No lesions,  mouth clear,  oropharynx clear, no postnasal drip, hoarse voice  Neck: No JVD, no TMG, no carotid bruits  Lungs: No use of accessory muscles, very distant, clear without  rales or rhonchi  Cardiovascular: RRR, heart sounds normal, no murmur or gallops, no peripheral edema  Musculoskeletal: No deformities, no cyanosis or clubbing  Neuro: alert, non focal  Skin: Warm, no lesions or rashes   CT chest 11/30/16 --  COMPARISON:  Chest CT 06/22/2016.  FINDINGS: Cardiovascular: Heart size is normal. There is no significant pericardial fluid, thickening or pericardial calcification. There is aortic atherosclerosis, as well as atherosclerosis of the great vessels of the mediastinum and the coronary arteries, including calcified atherosclerotic plaque in the left main, left anterior descending, left circumflex and right coronary arteries. Left circumflex coronary artery stent.  Mediastinum/Nodes: No pathologically enlarged mediastinal or hilar lymph nodes. Please note that accurate exclusion of hilar adenopathy is limited on noncontrast CT scans. Esophagus is unremarkable in appearance. No axillary lymphadenopathy.  Lungs/Pleura: Several nodular appearing areas of architectural distortion are again noted in the lungs bilaterally. The largest and most concerning of these is in the superior segment of the left lower lobe (axial image 62 of series 3) which currently measures 1.6 x 2.2 cm (previously 1.5 cm on 06/22/2016), and in the posterior aspect of the right lower lobe (image 110 of series 3) measuring 2.5 x 1.0 cm (previously 1.9 cm). Previously noted nodular areas of architectural distortion in the right middle lobe have resolved compared to the prior study, indicative of a benign infectious or inflammatory etiology on the prior study. Nodular areas of pleural-parenchymal thickening are again noted in the apices of the lungs bilaterally, most compatible with chronic post infectious or inflammatory scarring. Suture line in the right lower lobe, compatible with prior wedge resection. No acute consolidative airspace disease. No pleural effusions. Mild  diffuse bronchial wall thickening with moderate centrilobular and paraseptal emphysema.  Upper Abdomen: Aortic atherosclerosis.  Musculoskeletal: There are no aggressive appearing lytic or blastic lesions noted in the visualized portions of the skeleton.  IMPRESSION: 1. Resolution of previously noted right middle lobe pulmonary nodules, indicative of a benign infectious or inflammatory etiology on the prior study. 2. Slow progressive enlargement of 2 nodules in the superior segment of the left lower lobe and in the periphery of the right lower lobe when compared to numerous prior examinations. These findings remain concerning for potential neoplasm. Further evaluation with PET-CT is suggested in the near future. 3. Diffuse bronchial wall thickening with moderate centrilobular and paraseptal emphysema; imaging findings compatible with the reported clinical history of COPD. 4. Aortic atherosclerosis, in addition to left main and 3 vessel coronary artery disease. Assessment for potential risk factor modification, dietary therapy or pharmacologic therapy may be warranted, if clinically indicated. 5. Additional incidental findings, as above      Assessment & Plan:  COPD (chronic obstructive pulmonary disease) (HCC) Continue albuterol as needed. We will give her a spacer to help with delivery.   Pulmonary nodules Enlargement of both her right lower lobe and left lower lobe nodules, both have moderate hypermetabolism on  recent PET scan. The left lower lobe groundglass nodule appears to be the most concerning. We will arrange for needle biopsy of this lesion. Question significance of her hyper-metabolic right axillary nodule.   Baltazar Apo, MD, PhD 12/28/2016, 4:34 PM [facility] [phone] or if no answer [phone]

## 2016-12-28 NOTE — Assessment & Plan Note (Signed)
Enlargement of both her right lower lobe and left lower lobe nodules, both have moderate hypermetabolism on recent PET scan. The left lower lobe groundglass nodule appears to be the most concerning. We will arrange for needle biopsy of this lesion. Question significance of her hyper-metabolic right axillary nodule.

## 2016-12-28 NOTE — Patient Instructions (Addendum)
We will arrange for a needle biopsy of your left lower lobe lung nodule.  Take albuterol 2 puffs up to every 4 hours if needed for shortness of breath.  Follow with Dr Lamonte Sakai next available.

## 2016-12-31 ENCOUNTER — Telehealth: Payer: Self-pay | Admitting: Cardiovascular Disease

## 2016-12-31 NOTE — Telephone Encounter (Signed)
New message    [name] is calling asking for a call back. She said it is about pt medication.

## 2016-12-31 NOTE — Telephone Encounter (Signed)
I spoke with Almyra Free and the pt is scheduled for a needle biopsy of lung next Tuesday.  The pt has been advised to begin holding ASA and plavix starting tomorrow in preparation for procedure. Almyra Free contacted our office because the pt was advised to take her cardiac medications the morning of procedure but Almyra Free wanted to clarify exactly what she needs to take. I instructed her to have the pt take Furosemide, Isosorbide [place] and Spironolactone.

## 2017-01-01 NOTE — Telephone Encounter (Signed)
Dr Burt Knack is aware that pt will be holding ASA and Plavix for needle biopsy, this is okay.

## 2017-01-04 ENCOUNTER — Other Ambulatory Visit: Payer: Self-pay | Admitting: Radiology

## 2017-01-04 ENCOUNTER — Other Ambulatory Visit: Payer: Self-pay | Admitting: General Surgery

## 2017-01-04 ENCOUNTER — Telehealth: Payer: Self-pay | Admitting: Emergency Medicine

## 2017-01-04 NOTE — Telephone Encounter (Signed)
Spoke with ilene one of the radiology nurses at [facility] who stated that it would be ok for pt to use her nebulizer medication. Spoke with Almyra Free and informed her of this. She had no further questions at this time.

## 2017-01-05 ENCOUNTER — Other Ambulatory Visit: Payer: Self-pay | Admitting: Emergency Medicine

## 2017-01-05 ENCOUNTER — Encounter (HOSPITAL_COMMUNITY): Payer: Self-pay

## 2017-01-05 ENCOUNTER — Ambulatory Visit (HOSPITAL_COMMUNITY)
Admission: RE | Admit: 2017-01-05 | Discharge: 2017-01-05 | Disposition: A | Discharge status: Home / Self Care | Payer: PPO | Source: Ambulatory Visit | Attending: Emergency Medicine | Admitting: Emergency Medicine

## 2017-01-05 DIAGNOSIS — I2510 Atherosclerotic heart disease of native coronary artery without angina pectoris: Secondary | ICD-10-CM | POA: Insufficient documentation

## 2017-01-05 DIAGNOSIS — K219 Gastro-esophageal reflux disease without esophagitis: Secondary | ICD-10-CM | POA: Diagnosis not present

## 2017-01-05 DIAGNOSIS — Z85118 Personal history of other malignant neoplasm of bronchus and lung: Secondary | ICD-10-CM | POA: Diagnosis not present

## 2017-01-05 DIAGNOSIS — E785 Hyperlipidemia, unspecified: Secondary | ICD-10-CM | POA: Diagnosis not present

## 2017-01-05 DIAGNOSIS — Z8249 Family history of ischemic heart disease and other diseases of the circulatory system: Secondary | ICD-10-CM | POA: Diagnosis not present

## 2017-01-05 DIAGNOSIS — I252 Old myocardial infarction: Secondary | ICD-10-CM | POA: Diagnosis not present

## 2017-01-05 DIAGNOSIS — R918 Other nonspecific abnormal finding of lung field: Secondary | ICD-10-CM | POA: Diagnosis not present

## 2017-01-05 DIAGNOSIS — Z888 Allergy status to other drugs, medicaments and biological substances status: Secondary | ICD-10-CM | POA: Diagnosis not present

## 2017-01-05 DIAGNOSIS — M810 Age-related osteoporosis without current pathological fracture: Secondary | ICD-10-CM | POA: Diagnosis not present

## 2017-01-05 DIAGNOSIS — Z79899 Other long term (current) drug therapy: Secondary | ICD-10-CM | POA: Insufficient documentation

## 2017-01-05 DIAGNOSIS — Z885 Allergy status to narcotic agent status: Secondary | ICD-10-CM | POA: Diagnosis not present

## 2017-01-05 DIAGNOSIS — Z7902 Long term (current) use of antithrombotics/antiplatelets: Secondary | ICD-10-CM | POA: Diagnosis not present

## 2017-01-05 DIAGNOSIS — Z87891 Personal history of nicotine dependence: Secondary | ICD-10-CM | POA: Diagnosis not present

## 2017-01-05 DIAGNOSIS — I130 Hypertensive heart and chronic kidney disease with heart failure and stage 1 through stage 4 chronic kidney disease, or unspecified chronic kidney disease: Secondary | ICD-10-CM | POA: Diagnosis not present

## 2017-01-05 DIAGNOSIS — J439 Emphysema, unspecified: Secondary | ICD-10-CM | POA: Diagnosis not present

## 2017-01-05 DIAGNOSIS — H3530 Unspecified macular degeneration: Secondary | ICD-10-CM | POA: Insufficient documentation

## 2017-01-05 DIAGNOSIS — R911 Solitary pulmonary nodule: Secondary | ICD-10-CM

## 2017-01-05 DIAGNOSIS — Z823 Family history of stroke: Secondary | ICD-10-CM | POA: Diagnosis not present

## 2017-01-05 DIAGNOSIS — Z7982 Long term (current) use of aspirin: Secondary | ICD-10-CM | POA: Diagnosis not present

## 2017-01-05 DIAGNOSIS — Z833 Family history of diabetes mellitus: Secondary | ICD-10-CM | POA: Insufficient documentation

## 2017-01-05 DIAGNOSIS — N183 Chronic kidney disease, stage 3 (moderate): Secondary | ICD-10-CM | POA: Insufficient documentation

## 2017-01-05 DIAGNOSIS — Z803 Family history of malignant neoplasm of breast: Secondary | ICD-10-CM | POA: Insufficient documentation

## 2017-01-05 DIAGNOSIS — Z7951 Long term (current) use of inhaled steroids: Secondary | ICD-10-CM | POA: Insufficient documentation

## 2017-01-05 DIAGNOSIS — I5042 Chronic combined systolic (congestive) and diastolic (congestive) heart failure: Secondary | ICD-10-CM | POA: Diagnosis not present

## 2017-01-05 DIAGNOSIS — Z8673 Personal history of transient ischemic attack (TIA), and cerebral infarction without residual deficits: Secondary | ICD-10-CM | POA: Insufficient documentation

## 2017-01-05 LAB — APTT: APTT: 29 s (ref 24–36)

## 2017-01-05 LAB — CBC
HCT: 44.3 % (ref 36.0–46.0)
HEMOGLOBIN: 14.9 g/dL (ref 12.0–15.0)
MCH: 32.1 pg (ref 26.0–34.0)
MCHC: 33.6 g/dL (ref 30.0–36.0)
MCV: 95.5 fL (ref 78.0–100.0)
PLATELETS: 240 10*3/uL (ref 150–400)
RBC: 4.64 MIL/uL (ref 3.87–5.11)
RDW: 13.4 % (ref 11.5–15.5)
WBC: 7.4 10*3/uL (ref 4.0–10.5)

## 2017-01-05 LAB — PROTIME-INR
INR: 0.93
PROTHROMBIN TIME: 12.4 s (ref 11.4–15.2)

## 2017-01-05 MED ORDER — SODIUM CHLORIDE 0.9 % IV SOLN
INTRAVENOUS | Status: DC | PRN
  Administered 2017-01-05: 75 mL/h via INTRAVENOUS

## 2017-01-05 MED ORDER — SODIUM CHLORIDE 0.9 % IV SOLN: INTRAVENOUS | Status: DC

## 2017-01-05 MED ORDER — MIDAZOLAM HCL 2 MG/2ML IJ SOLN
INTRAMUSCULAR | Status: AC
  Filled 2017-01-05: qty 4

## 2017-01-05 MED ORDER — FENTANYL CITRATE (PF) 100 MCG/2ML IJ SOLN
INTRAMUSCULAR | Status: AC
  Filled 2017-01-05: qty 4

## 2017-01-05 MED ORDER — LIDOCAINE HCL 1 % IJ SOLN
INTRAMUSCULAR | Status: AC
  Filled 2017-01-05: qty 20

## 2017-01-05 NOTE — H&P (Signed)
Chief Complaint: Patient was seen in consultation today for left lung mass biopsy at the request of Byrum,Robert S  Referring Physician(s): Baltazar Apo S  Supervising Physician: Corrie Mckusick  Patient Status: [facility]  History of Present Illness: [name] is a 76 y.o. female   Hx CAD/MI; COPD; CHF; CVA  Hx adenocarcinoma Right segmentectomy 2008 Has followed with Dr Lamonte Sakai Known small nodules in RLL and LLL New enlargement and PET + 12/22/16: IMPRESSION: Gradual mild increase in size and FDG uptake of bilateral lower lobe pulmonary nodules compared to previous studies dating back to in 2016. Differential diagnosis includes low-grade bronchogenic carcinomas and postinflammatory scarring. Sub-cm lymph nodes in right axilla, right hilum, and mediastinum with low-grade metabolic activity. Differential diagnosis includes reactive etiology, metastatic disease, and lymphoproliferative disorder. Stable emphysema and right lower lobe postop changes.  Scheduled now for LLL enlarging nodule per Dr Lamonte Sakai request Last dose Plavix 12/30/16   Past Medical History:  Diagnosis Date  . Arthritis   . CAD (coronary artery disease) 03/2009   s/p MI  . Chronic combined systolic and diastolic heart failure ([place]) 06/17/2009   Qualifier: Diagnosis of  By: Burt Knack, MD, Clayburn Pert   . CKD (chronic kidney disease) stage 3, GFR 30-59 ml/min   . Emphysema/COPD ([place])   . Ex-smoker quit 2008  . GERD (gastroesophageal reflux disease)   . HCAP (healthcare-associated pneumonia) 03/19/2016  . Helicobacter pylori gastritis 10/2007   treated  . History of CVA (cerebrovascular accident) without residual deficits 01/2012, 06/2012   R hemorrhagic MCA 01/2012 with remote lacunar infarct L putamen and IC, rpt 06/2012 acute multifocal R MCA infarct with remote hemorrhagic strokes affecting L basal ganglia and periventricular white matter, full recovery  . HLD (hyperlipidemia)   . HTN  (hypertension)   . Ischemic cardiomyopathy 02/27/2014  . Lung cancer ([place]) dx'd 07/2006   Lung CA, s/p resection, followed by Dr Earlie Server  . Macular degeneration   . Myocardial infarction [facility]) 03/2009   Acute myocardial infarction 2010 - treated with BMS of LCx. LVEF 50% with subsequent CHF  . Osteoporosis 09/2013   T -3.6 forearm 09/2013, T -4.5 forearm 04/2015  . Stroke [facility])     Past Surgical History:  Procedure Laterality Date  . BREAST BIOPSY Left   . CARDIAC CATHETERIZATION N/A 02/24/2016   Procedure: Left Heart Cath and Coronary Angiography;  Surgeon: Belva Crome, MD;  Location: [place] CV LAB;  Service: Cardiovascular;  Laterality: N/A;  . CARDIAC CATHETERIZATION N/A 07/09/2016   Procedure: Left Heart Cath and Coronary Angiography;  Surgeon: Peter M Martinique, MD;  Location: [place] CV LAB;  Service: Cardiovascular;  Laterality: N/A;  . CARDIAC CATHETERIZATION N/A 07/09/2016   Procedure: Coronary Stent Intervention;  Surgeon: Peter M Martinique, MD;  Location: [place] CV LAB;  Service: Cardiovascular;  Laterality: N/A;  . CATARACT EXTRACTION Bilateral   . CORONARY STENT PLACEMENT  06/2016   DES to mid LAD  . EYE SURGERY    . LUNG REMOVAL, PARTIAL Right 2008  . PARTIAL HYSTERECTOMY  1986   irregular periods, ovaries remain    Allergies: Actonel [risedronate sodium]; Amlodipine; Fosamax [alendronate sodium]; Lisinopril; Pravastatin; and Codeine  Medications: Prior to Admission medications   Medication Sig Start Date End Date Taking? Authorizing Provider  albuterol (PROAIR HFA) 108 (90 Base) MCG/ACT inhaler Inhale 2 puffs into the lungs every 4 (four) hours as needed for wheezing or shortness of breath. 03/24/16  Yes [place], Grayson  A, MD  albuterol (PROVENTIL) (2.5 MG/3ML) 0.083% nebulizer solution Take 3 mLs (2.5 mg total) by nebulization every 4 (four) hours as needed for wheezing or shortness of breath. 07/31/16  Yes Collene Gobble, MD  aspirin EC 81 MG tablet Take  1 tablet (81 mg total) by mouth daily. 02/26/16  Yes Arbutus Leas, NP  Calcium Carbonate-Vitamin D (CALCIUM-VITAMIN D) 500-200 MG-UNIT per tablet Take 1 tablet by mouth 2 (two) times daily.    Yes [provider]  Cholecalciferol (VITAMIN D) 2000 UNITS CAPS Take 1 capsule by mouth daily.   Yes [provider]  clopidogrel (PLAVIX) 75 MG tablet Take 1 tablet (75 mg total) by mouth daily. 02/26/16  Yes Arbutus Leas, NP  denosumab (PROLIA) 60 MG/ML SOLN injection Inject 60 mg into the skin every 6 (six) months. Administer in upper arm, thigh, or abdomen 06/13/15  Yes Ria Bush, MD  fluticasone [facility]) 50 MCG/ACT nasal spray Place 2 sprays into both nostrils daily. 07/31/16  Yes Collene Gobble, MD  isosorbide mononitrate (IMDUR) 30 MG 24 hr tablet TAKE 1 TABLET BY MOUTH DAILY 05/25/16  Yes Sherren Mocha, MD  loratadine (CLARITIN) 10 MG tablet Take 10 mg by mouth daily.   Yes [provider]  losartan (COZAAR) 25 MG tablet Take 0.5 tablets (12.5 mg total) by mouth daily. 07/10/16  Yes Bhagat, Bhavinkumar, PA  LOTEMAX 0.5 % ophthalmic suspension Place 1 drop into both eyes daily.  12/27/13  Yes [provider]  pantoprazole (PROTONIX) 40 MG tablet Take 1 tablet (40 mg total) by mouth daily. 02/27/16  Yes Sherren Mocha, MD  simvastatin (ZOCOR) 40 MG tablet TAKE ONE TABLET BY MOUTH EVERY NIGHT AT BEDTIME 06/08/16  Yes Sherren Mocha, MD  Spacer/Aero-Holding Chambers (AEROCHAMBER MV) inhaler Use as instructed 12/28/16  Yes Collene Gobble, MD  spironolactone (ALDACTONE) 25 MG tablet TAKE 1/2 TABLET BY MOUTH ONCE DAILY 04/14/16  Yes Sherren Mocha, MD  furosemide (LASIX) 20 MG tablet Take 2 tablets by mouth daily Patient not taking: Reported on 12/31/2016 12/10/16   Sherren Mocha, MD  nitroGLYCERIN (NITROSTAT) 0.4 MG SL tablet Place 1 tablet (0.4 mg total) under the tongue every 5 (five) minutes as needed for chest pain. 07/03/15   Liliane Shi, PA-C     Family  History  Problem Relation Age of Onset  . CAD Mother 79       MI  . Stroke Mother   . Hypertension Mother   . Heart attack Mother   . CAD Father 57  . Stroke Father   . Breast cancer Sister   . Diabetes Sister     Social History   Social History  . Marital status: Widowed    Spouse name: N/A  . Number of children: 1  . Years of education: N/A   Occupational History  . retired     Insurance underwriter   Social History Main Topics  . Smoking status: Former Smoker    Types: Cigarettes    Quit date: 07/27/2002  . Smokeless tobacco: Never Used  . Alcohol use No  . Drug use: No  . Sexual activity: No   Other Topics Concern  . None   Social History Narrative   The patient is a widow and has one son and one dog.     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  She does not drink alcohol. 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   Ed: HS   Activity: no regular exercise   Diet: some water, some fruits/vegetables    Lives in a one story home.     Review of Systems: A 12 point ROS discussed and pertinent positives are indicated in the HPI above.  All other systems are negative.  Review of Systems  Constitutional: Positive for fatigue. Negative for activity change and fever.  Respiratory: Positive for cough and shortness of breath.   Neurological: Positive for weakness.  Psychiatric/Behavioral: Negative for behavioral problems and confusion.    Vital Signs: BP (!) 146/99 (BP Location: Right Arm)   Pulse 60   Temp 97.5 F (36.4 C) (Oral)   Ht 5\' 6"  (1.676 m)   Wt 120 lb (54.4 kg)   SpO2 97%   BMI 19.37 kg/m   Physical Exam  Constitutional: She is oriented to person, place, and time.  Cardiovascular: Normal rate.   Irregular rhythm  Pulmonary/Chest: Effort normal and breath sounds normal. She has no wheezes.  Abdominal: Soft. Bowel sounds are normal. There is no tenderness.  Musculoskeletal: Normal range of motion.    Neurological: She is alert and oriented to person, place, and time.  Skin: Skin is warm and dry.  Psychiatric: She has a normal mood and affect. Her behavior is normal. Judgment and thought content normal.  Nursing note and vitals reviewed.   Mallampati Score:  MD Evaluation Airway: WNL Heart: WNL Abdomen: WNL Chest/ Lungs: WNL ASA  Classification: 3 Mallampati/Airway Score: One  Imaging: Nm Pet Image Restag (ps) Skull Base To Thigh  Result Date: 12/22/2016 CLINICAL DATA:  Subsequent treatment strategy for right lung carcinoma and indeterminate bilateral pulmonary nodules. EXAM: NUCLEAR MEDICINE PET SKULL BASE TO THIGH TECHNIQUE: 11.8 mCi F-18 FDG was injected intravenously. Full-ring PET imaging was performed from the skull base to thigh after the radiotracer. CT data was obtained and used for attenuation correction and anatomic localization. FASTING BLOOD GLUCOSE:  Value: 102 mg/dl COMPARISON:  Prior chest CTs dating back to 05/22/2015, and PET-CT 05/31/2015 FINDINGS: NECK No hypermetabolic lymph nodes in the neck. CHEST New 9 mm hypermetabolic lymph node is seen in the right axilla on image 73 75/3, which has SUV max of 4.3. Sub-cm lymph nodes and right paratracheal, AP window, and right hilar regions show mild FDG uptake, with SUV max in right hilum measuring 3.8. Pleural-based nodule in posterior right lower lobe currently measures 2.5 x 0.8 cm on image 123/3, which shows mild increase in size compared with 1.4 x 0.7 cm on 05/22/2015 chest CT. This has SUV max of 3.3 compared with 2.1 on prior 2016 PET. Irregular pulmonary nodule in the superior segment of the left lower lobe currently measures 2.0 x 1.3 cm on image 83/3, compared to 1.1 x 1.1 cm on 05/22/2015 CT. This has SUV max of 2.5 and current study compared to 1.6 on prior 2016 PET. Stable mild to moderate centrilobular emphysema and postoperative changes in central right lower lobe. Aortic and coronary artery atherosclerosis.  ABDOMEN/PELVIS No abnormal hypermetabolic activity within the liver, pancreas, adrenal glands, or spleen. No hypermetabolic lymph nodes in the abdomen or pelvis. Previous hysterectomy again noted.  Aortic atherosclerosis. SKELETON No focal hypermetabolic activity to suggest skeletal metastasis. IMPRESSION: Gradual mild increase in size and FDG uptake of bilateral lower lobe pulmonary nodules compared to previous studies dating back to in 2016. Differential diagnosis includes low-grade bronchogenic carcinomas and postinflammatory scarring. Sub-cm lymph nodes in right axilla, right  hilum, and mediastinum with low-grade metabolic activity. Differential diagnosis includes reactive etiology, metastatic disease, and lymphoproliferative disorder. Stable emphysema and right lower lobe postop changes. Electronically Signed   By: Earle Gell M.D.   On: 12/22/2016 15:39    Labs:  CBC:  Recent Labs  07/08/16 0800 07/08/16 0812 07/08/16 1324 07/10/16 0251 01/05/17 0923  WBC 8.3  --  7.3 7.9 7.4  HGB 14.1 14.3 13.6 12.1 14.9  HCT 41.1 42.0 41.3 36.5 44.3  PLT 297  --  302 255 240    COAGS:  Recent Labs  02/24/16 0335 02/24/16 0356 07/08/16 0800 01/05/17 0923  INR 1.04 1.26 0.98 0.93  APTT 33 >200* 31 29    BMP:  Recent Labs  07/08/16 0800 07/08/16 0812 07/08/16 1324 07/09/16 0204 07/10/16 0251 10/29/16 1134 11/25/16 1131  NA 140 140  --  138 139  --  138  K 4.4 4.3  --  4.0 3.9  --  4.0  CL 106 103  --  102 106  --  103  CO2 26  --   --  28 26  --  29  GLUCOSE 106* 105*  --  95 96  --  82  BUN 12 14  --  13 11  --  22  CALCIUM 9.9  --   --  8.6* 8.4* 9.8 9.6  CREATININE 0.83 0.80 0.93 0.87 0.76  --  1.08  GFRNONAA >60  --  59* >60 >60  --   --   GFRAA >60  --  >60 >60 >60  --   --     LIVER FUNCTION TESTS:  Recent Labs  03/18/16 2225  04/06/16 0253 04/28/16 0939 07/08/16 0800 11/25/16 1131  BILITOT 0.4  --  0.6 0.5 0.7  --   AST 47*  --  18 18 22   --   ALT 37  --   17 14 21   --   ALKPHOS 77  --  55 78 67  --   PROT 6.4*  --  5.5* 7.3 6.7  --   ALBUMIN 3.1*  < > 2.9* 3.9 3.6 4.1  < > = values in this interval not displayed.  TUMOR MARKERS: No results for input(s): AFPTM, CEA, CA199, CHROMGRNA in the last 8760 hours.  Assessment and Plan:  Known Hx adenocarcinoma 2008 RLL segmentectomy 2008 Follows with Dr Lamonte Sakai New enlarging +PET LLL nodule Scheduled for biopsy of same Risks and Benefits discussed with the patient including, but not limited to bleeding, hemoptysis, respiratory failure requiring intubation, infection, pneumothorax requiring chest tube placement, stroke from air embolism or even death. All of the patient's questions were answered, patient is agreeable to proceed. Consent signed and in chart.  Thank you for this interesting consult.  I greatly enjoyed meeting [name] and look forward to participating in their care.  A copy of this report was sent to the requesting provider on this date.  Electronically Signed: Lavonia Drafts, PA-C 01/05/2017, 10:10 AM   I spent a total of  30 Minutes   in face to face in clinical consultation, greater than 50% of which was counseling/coordinating care for left lung mass bx

## 2017-01-05 NOTE — Sedation Documentation (Signed)
Dr Jacqualyn Posey at bedside.  Procedure explained, questions answered.

## 2017-01-05 NOTE — Sedation Documentation (Signed)
Dr Earleen Newport has reviewed images, calling Dr Lamonte Sakai.  Pt comfortable on table, aware of delay.

## 2017-01-05 NOTE — Sedation Documentation (Addendum)
Moved to CT, positioned on table with left side down.  Pt tolerating well, reports unable to lie flat on back.  Head elevated on 2 pillows.  O2 placed 2l/[place].  Sat 100%.

## 2017-01-05 NOTE — Sedation Documentation (Signed)
Procedure cancelled due to decreased size of lesion.

## 2017-01-05 NOTE — Procedures (Signed)
Interventional Radiology Procedure Note  Patient presents for left posterior lung biopsy.    Scout images show that the nodule is smaller/retracting, with less nodular component over time.  Page to referring physician.    Discussed findings with the patient.  Will defer biopsy for now.  Will discuss with Dr. Lamonte Sakai.    Please call with questions/concerns.  [phone]  Signed,  Dulcy Fanny Earleen Newport, DO

## 2017-01-11 ENCOUNTER — Telehealth: Payer: Self-pay | Admitting: Emergency Medicine

## 2017-01-11 NOTE — Telephone Encounter (Addendum)
Spoke with pt's neice, Almyra Free. She states that the pt did not have her CT biopsy done last week. States that the physician who was to perform the biopsy did not see what RB saw on the scan and told them that he was not comfortable doing the biopsy until she spoke with RB first.  RB - please advise. Thanks.

## 2017-01-13 NOTE — Telephone Encounter (Signed)
Spoke with pt's niece, Almyra Free. She is aware of RB's response. Nothing further was needed.

## 2017-01-13 NOTE — Telephone Encounter (Signed)
Let her know that I reviewed the IR notes. The scout CT scan showed that her L nodule was smaller than on our study. This is good news and argues against a lung cancer. He held off on the biopsy since a decrease in size made it less likely that the pulmonary nodule was malignancy. We can decide at her next visit how we want to follow the nodules w scans as we go forward.

## 2017-02-15 ENCOUNTER — Telehealth: Payer: Self-pay | Admitting: Family Medicine

## 2017-02-15 NOTE — Telephone Encounter (Signed)
Please call patient back about Prolia.

## 2017-02-16 NOTE — Telephone Encounter (Signed)
Please call pt back about prolia

## 2017-02-16 NOTE — Telephone Encounter (Signed)
Spoke to pt and provided prolia CPT code for completion of her grant paperwork

## 2017-02-25 ENCOUNTER — Other Ambulatory Visit: Payer: Self-pay | Admitting: Cardiovascular Disease

## 2017-02-26 ENCOUNTER — Other Ambulatory Visit: Payer: Self-pay | Admitting: *Deleted

## 2017-02-26 MED ORDER — SIMVASTATIN 40 MG PO TABS: 40.0000 mg | ORAL_TABLET | Freq: Every day | ORAL | 6 refills | Status: DC

## 2017-03-01 ENCOUNTER — Other Ambulatory Visit: Payer: Self-pay

## 2017-03-01 MED ORDER — PANTOPRAZOLE SODIUM 40 MG PO TBEC: 40.0000 mg | DELAYED_RELEASE_TABLET | Freq: Every day | ORAL | 7 refills | Status: DC

## 2017-03-09 ENCOUNTER — Telehealth: Payer: Self-pay | Admitting: Emergency Medicine

## 2017-03-09 MED ORDER — AZITHROMYCIN 250 MG PO TABS: ORAL_TABLET | ORAL | 0 refills | Status: DC

## 2017-03-09 MED ORDER — PREDNISONE 10 MG PO TABS: ORAL_TABLET | ORAL | 0 refills | Status: DC

## 2017-03-09 NOTE — Telephone Encounter (Signed)
Patient request to speak with someone before the office close for the day.

## 2017-03-09 NOTE — Telephone Encounter (Signed)
Pt aware of rec's per RB.  Rxs sent as directed. Nothing further needed.

## 2017-03-09 NOTE — Telephone Encounter (Signed)
Will send to Dr Lamonte Sakai again for recommendations.  Pt aware that he has been paged again.

## 2017-03-09 NOTE — Telephone Encounter (Signed)
Have her take pred taper > Take 40mg  daily for 3 days, then 30mg  daily for 3 days, then 20mg  daily for 3 days, then 10mg  daily for 3 days, then stop Azithromycin Z pack If she is having breathing distress then she needs to go be evaluated.

## 2017-03-09 NOTE — Telephone Encounter (Signed)
Pt having increased cough, hoarseness and nasal congestion Pt states that she does have some congestion in her chest but not a lot. Pt states that she started with the cold symptoms about 1 week ago.  Pt used Mucous Relief DM Cough, pt states that this made her very dizzy and she had to stop taking it.  Uses nebulizer medication TID -- not helping much. Pt not sure if O2 levels are low.  Pt breathing rapidly on the phone trying to catch breath in between sentences.   Please advise Dr Lamonte Sakai. Thanks.

## 2017-03-24 ENCOUNTER — Encounter: Payer: Self-pay | Admitting: Emergency Medicine

## 2017-03-24 ENCOUNTER — Ambulatory Visit (INDEPENDENT_AMBULATORY_CARE_PROVIDER_SITE_OTHER): Payer: PPO | Admitting: Emergency Medicine

## 2017-03-24 VITALS — BP 124/82 | HR 71 | Ht 66.0 in | Wt 128.0 lb

## 2017-03-24 DIAGNOSIS — R918 Other nonspecific abnormal finding of lung field: Secondary | ICD-10-CM

## 2017-03-24 DIAGNOSIS — R001 Bradycardia, unspecified: Secondary | ICD-10-CM

## 2017-03-24 NOTE — Patient Instructions (Signed)
Please finish prednisone as planned Please continue DuoNeb 3 times a day. Please use albuterol as needed for shortness of breath We will refer you to see cardiology sooner than your originally scheduled appointment given the slow heart rate and symptoms that you have observed We will repeat a CT scan of the chest without contrast in December 2018 to follow pulmonary nodules Follow with Dr Lamonte Sakai in December following your CT scan to review the results

## 2017-03-24 NOTE — Assessment & Plan Note (Signed)
With a recent exacerbation. She has responded to prednisone plus azithromycin. She will complete the prednisone today. Continue DuoNeb 3 times a day, albuterol as needed

## 2017-03-24 NOTE — Progress Notes (Signed)
Subjective:    Patient ID: Michal Strzelecki [place], female    DOB: 05-19-1941, 76 y.o.   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 She has been having dyspnea for > 1 year, some cough that is productive of white mucous. She has hoarse voice frequently, not necessarily related to cough. Not clear that it relates in time to symbicort.    ROV 11/27/15 -- patient with a history of COPD, adenoCA of the lung, and pulmonary nodules that we have followed by CT scan of the chest. She had a PET scan in November 2016 as above. Her most recent CT scan of the chest was 11/18/15 that I personally reviewed today. Her left lower lobe superior segmental nodule may have changed slightly in size and shape but appears to be associated with evolving atx. Also, it was not hypermetabolic on PET from 24/26.  She has albuterol to use prn, uses it a few times a week.   ROV 07/31/16 -- This follow-up visit for patient with a history of COPD, lobe superior segmentectomy for adenocarcinoma of the lung in 2008.  We have been following a left lower lobe superior segmental nodule by serial CT scans. PET negative in November 2016. Repeat CT scan of the chest in November 2017 showed that the nodule is stable. There were some additional right middle lobe nodular opacities noted likely most consistent with chronic infectious process. She has been seen and treated for acute exacerbations twice since our last visit.  She is on budesonide bid, duoneb bid  ROV 11/03/16 -- Pt with COPD, adenoCA s/p RLL sup seg resection 2008. Have been following a LLL nodule by serial Ct scan, last was 11/17.  She is on budesonide nebs bid, DuoNeb tid. She is on flonase qd. She is having hoarseness again, more problematic last few weeks. ? Allergy season.   ROV 12/08/16 -- This follow-up visit for 76 year old woman with a history of  adenocarcinoma of the lung status post right lower lobe superior segmental resection in 2008, COPD, a stable left lower lobe nodule that we have followed by serial CT scans of the chest. She underwent a repeat CT scan on 11/30/16 that I have personally reviewed. This shows a persistent peripheral right lower lobe nodular lesion and a slowly enlarging left lower lobe posterior nodule, now measuring approximately 16 x 22 mm.  She reports some hoarse voice, more mucous. She is using DuoNeb tid, + prn albuterol. She is coughing up dark mucous, brown.        ROV 12/28/16 --   patient has a history of right lower lobe superior segmental resection in 2008 for adenocarcinoma of the lung. Also history of COPD. We have been following pulmonary nodules. On her most recent CT scan 11/30/16 there was an enlargement in her left lower lobe posterior nodule. This prompted [place] to perform a PET scan that was done on 12/22/16 that I have personally reviewed. This shows a slight increase in moderate hypermetabolism in both the left lower lobe and right lower lobe nodules corresponding with a very slow increase in size since 2016. Some mild hypermetabolism noted in the mediastinal lymph nodes as well as a right axillary node that is hypermetabolic, unclear significance.                        ROV 03/24/17 --  follow-up visit for patient with a history of COPD, right lower lobe superior segmental adenocarcinoma status post resection. We have been following pulmonary nodules, had planned for a needle biopsy 01/05/17 but at this time the nodular lesion was smaller and less prominent. She has been dealing with URI sx, a flare that was treated with pred + azithro. She is taking albuterol / ipratropium nebs tid.                 She also tells me that she has observed symptomatic bradycardia - in the am when she wakes, sometimes HR is in 30's. This correlates with times that she feels bad - fatigued, weak, dyspneic. She does have a history of  coronary disease,  no rhythm disturbance to my knowledge.  She is not on any rate control medications.                                                                          Review of Systems     As per history of present illness    Objective:   Physical Exam Vitals:   03/24/17 1616  BP: 124/82  Pulse: 71  SpO2: 95%  Weight: 128 lb (58.1 kg)  Height: 5\' 6"  (1.676 m)    Gen: Pleasant, elderly in no distress,  normal affect  ENT: No lesions,  mouth clear,  oropharynx clear, no postnasal drip, hoarse voice  Neck: No JVD, no TMG, no carotid bruits  Lungs: No use of accessory muscles, very distant, clear without rales or rhonchi  Cardiovascular: RRR, heart sounds normal, no murmur or gallops, no peripheral edema  Musculoskeletal: No deformities, no cyanosis or clubbing  Neuro: alert, non focal  Skin: Warm, no lesions or rashes   CT chest 11/30/16 --  COMPARISON:  Chest CT 06/22/2016.  FINDINGS: Cardiovascular: Heart size is normal. There is no significant pericardial fluid, thickening or pericardial calcification. There is aortic atherosclerosis, as well as atherosclerosis of the great vessels of the mediastinum and the coronary arteries, including calcified atherosclerotic plaque in the left main, left anterior descending, left circumflex and right coronary arteries. Left circumflex coronary artery stent.  Mediastinum/Nodes: No pathologically enlarged mediastinal or hilar lymph nodes. Please note that accurate exclusion of hilar adenopathy is limited on noncontrast CT scans. Esophagus is unremarkable in appearance. No axillary lymphadenopathy.  Lungs/Pleura: Several nodular appearing areas of architectural distortion are again noted in the lungs bilaterally. The largest and most concerning of these is in the superior segment of the left lower lobe (axial image 62 of series 3) which currently measures 1.6 x 2.2 cm (previously 1.5 cm on 06/22/2016), and in the  posterior aspect of the right lower lobe (image 110 of series 3) measuring 2.5 x 1.0 cm (previously 1.9 cm). Previously noted nodular areas of architectural distortion in the right middle lobe have resolved compared to the prior study, indicative of a benign infectious or inflammatory etiology on the prior study. Nodular areas of pleural-parenchymal thickening are again noted in the apices of the lungs bilaterally, most compatible with chronic post infectious or inflammatory scarring. Suture line in the right lower lobe, compatible with prior wedge resection. No acute consolidative airspace disease. No pleural effusions. Mild diffuse bronchial  wall thickening with moderate centrilobular and paraseptal emphysema.  Upper Abdomen: Aortic atherosclerosis.  Musculoskeletal: There are no aggressive appearing lytic or blastic lesions noted in the visualized portions of the skeleton.  IMPRESSION: 1. Resolution of previously noted right middle lobe pulmonary nodules, indicative of a benign infectious or inflammatory etiology on the prior study. 2. Slow progressive enlargement of 2 nodules in the superior segment of the left lower lobe and in the periphery of the right lower lobe when compared to numerous prior examinations. These findings remain concerning for potential neoplasm. Further evaluation with PET-CT is suggested in the near future. 3. Diffuse bronchial wall thickening with moderate centrilobular and paraseptal emphysema; imaging findings compatible with the reported clinical history of COPD. 4. Aortic atherosclerosis, in addition to left main and 3 vessel coronary artery disease. Assessment for potential risk factor modification, dietary therapy or pharmacologic therapy may be warranted, if clinically indicated. 5. Additional incidental findings, as above      Assessment & Plan:  COPD (chronic obstructive pulmonary disease) ([place]) With a recent exacerbation. She has  responded to prednisone plus azithromycin. She will complete the prednisone today. Continue DuoNeb 3 times a day, albuterol as needed  Cancer of lower lobe of right lung ([place]) Pulmonary nodules were not biopsied as they were smaller when she went for her interventional radiology eval, we will repeat her CT scan of the chest in December 2018 to look for interval change.  Baltazar Apo, MD, PhD 03/24/2017, 4:54 PM [facility] [phone] or if no answer [phone]

## 2017-03-24 NOTE — Assessment & Plan Note (Signed)
Pulmonary nodules were not biopsied as they were smaller when she went for her interventional radiology eval, we will repeat her CT scan of the chest in December 2018 to look for interval change.

## 2017-03-31 ENCOUNTER — Telehealth: Payer: Self-pay | Admitting: Family Medicine

## 2017-03-31 NOTE — Telephone Encounter (Signed)
Pt called to see if a ck was received $215.24 sent by [facility]. She said it was sent our office in [place]. She would like a cb.

## 2017-04-01 ENCOUNTER — Other Ambulatory Visit: Payer: Self-pay | Admitting: Emergency Medicine

## 2017-04-01 ENCOUNTER — Telehealth: Payer: Self-pay | Admitting: Cardiovascular Disease

## 2017-04-01 DIAGNOSIS — J441 Chronic obstructive pulmonary disease with (acute) exacerbation: Secondary | ICD-10-CM

## 2017-04-01 MED ORDER — CLOPIDOGREL BISULFATE 75 MG PO TABS: 75.0000 mg | ORAL_TABLET | Freq: Every day | ORAL | 11 refills | Status: DC

## 2017-04-01 NOTE — Telephone Encounter (Signed)
Called patient to follow up. Patient states that she has been having low HRs in the mornings when she first gets up for the past 2 weeks. She states that they have been 37-49. [place] states that her BP has been 100-120s/70s. Patient states that she has been having some dizziness but denies having any other symptoms and states that she feels fine and she is not sure what the big deal is. Patient was seen by Dr. Lamonte Sakai on 8/29 (notes in Epic). HR at that visit was 71, but she was advised to be seen by cardiology sooner than her originally scheduled appointment for further evaluation. Patient is not taking a BB. Patient was already scheduled for an appointment on 04/06/17. Offered patient appointment tomorrow and she states that she cannot come until next week. Patient advised to let us know if her symptoms worsen. Patient verbalized understanding.

## 2017-04-01 NOTE — Telephone Encounter (Signed)
New message    Pt pulse has been running low for a few days. Her doctor sent another referral to get her a sooner appt with Dr. Burt Knack. She said on 8/27-40 8/29-37 today-49. She said she has been feeling dizzy. appt schedule with Vin Bhagat on 04/06/17 at 11am

## 2017-04-01 NOTE — Telephone Encounter (Signed)
New message    Pt is completely out   *STAT* If patient is at the pharmacy, call can be transferred to refill team.   1. Which medications need to be refilled? (please list name of each medication and dose if known)  clopidogrel (PLAVIX) 75 MG tablet Take 1 tablet (75 mg total) by mouth daily     2. Which pharmacy/location (including street and city if local pharmacy) is medication to be sent to? [facility] [place]  3. Do they need a 30 day or 90 day supply?  [place]

## 2017-04-02 ENCOUNTER — Ambulatory Visit: Payer: PPO | Admitting: Physician Assistant

## 2017-04-02 DIAGNOSIS — H26493 Other secondary cataract, bilateral: Secondary | ICD-10-CM | POA: Diagnosis not present

## 2017-04-02 NOTE — Progress Notes (Signed)
Cardiology Office Note    Date:  04/06/2017   ID:  [name], DOB 1941/01/23, MRN [id number]  PCP:  Ria Bush, MD  Cardiologist:  Dr. Burt Knack   Chief Complaint: Low HR & Dizziness  History of Present Illness:   [name] is a 76 y.o. female with past medical history of CAD, chronic systolic heart failure/ICM , CKD stage III, Small cell lung CA s/p resection, COPD/emphysema, hypertension, hyperlipidemia, CVA and GERD presents for evaluation of low heart rate and dizziness.  She initially presented with an acute MI in 2010 with LCx occlusion tx with primary PCI with BMS. She also has a hx of R MCA strokein 2013. She presented in 7/17 with an anterior STEMI and was found to have an apical occlusion of the LAD. This was treated medically. Unable to tolerate BB or ACEi with her lung disease. The patient was readmitted in December 2017 with acute coronary syndrome and ruled in for non-STEMI. She underwent cardiac catheterization demonstrating a ruptured plaque in the LAD and she was treated with PCI without complication. LVEF is severely reduced. Her LVEDP was normal. Medications were adjusted.  She was doing well on cardiac stand point when last seen by Dr. Burt Knack 10/2016. Recently treated with prednisone and antibiotic for COPD exacerbation 02/2017.   Recently noted low HR to 37-49 with normal BP in morning with dizziness. Patient has noted intermittent dizziness and no pulse for the past 3-4 weeks. She is also fatigued and tired. She is unable to tell that of she is feeling poor when her pulse is slow. Intermittent lower extremity edema. Shortness of breath has been stable. No chest pain, palpitation, orthopnea, PND, syncope or melena. Low sodium diet.  Past Medical History:  Diagnosis Date  . Arthritis   . CAD (coronary artery disease) 03/2009   s/p MI  . Chronic combined systolic and diastolic heart failure ([place]) 06/17/2009   Qualifier: Diagnosis of  By: Burt Knack, MD,  Clayburn Pert   . CKD (chronic kidney disease) stage 3, GFR 30-59 ml/min   . Emphysema/COPD ([place])   . Ex-smoker quit 2008  . GERD (gastroesophageal reflux disease)   . HCAP (healthcare-associated pneumonia) 03/19/2016  . Helicobacter pylori gastritis 10/2007   treated  . History of CVA (cerebrovascular accident) without residual deficits 01/2012, 06/2012   R hemorrhagic MCA 01/2012 with remote lacunar infarct L putamen and IC, rpt 06/2012 acute multifocal R MCA infarct with remote hemorrhagic strokes affecting L basal ganglia and periventricular white matter, full recovery  . HLD (hyperlipidemia)   . HTN (hypertension)   . Ischemic cardiomyopathy 02/27/2014  . Lung cancer ([place]) dx'd 07/2006   Lung CA, s/p resection, followed by Dr Earlie Server  . Macular degeneration   . Myocardial infarction [facility]) 03/2009   Acute myocardial infarction 2010 - treated with BMS of LCx. LVEF 50% with subsequent CHF  . Osteoporosis 09/2013   T -3.6 forearm 09/2013, T -4.5 forearm 04/2015  . Stroke [facility])     Past Surgical History:  Procedure Laterality Date  . BREAST BIOPSY Left   . CARDIAC CATHETERIZATION N/A 02/24/2016   Procedure: Left Heart Cath and Coronary Angiography;  Surgeon: Belva Crome, MD;  Location: [place] [facility];  Service: Cardiovascular;  Laterality: N/A;  . CARDIAC CATHETERIZATION N/A 07/09/2016   Procedure: Left Heart Cath and Coronary Angiography;  Surgeon: Peter M Martinique, MD;  Location: [place] [facility];  Service: Cardiovascular;  Laterality: N/A;  . CARDIAC CATHETERIZATION N/A 07/09/2016  Procedure: Coronary Stent Intervention;  Surgeon: Peter M Martinique, MD;  Location: [place] [facility];  Service: Cardiovascular;  Laterality: N/A;  . CATARACT EXTRACTION Bilateral   . CORONARY STENT PLACEMENT  06/2016   DES to mid LAD  . EYE SURGERY    . LUNG REMOVAL, PARTIAL Right 2008  . PARTIAL HYSTERECTOMY  1986   irregular periods, ovaries remain    Current Medications: Prior to Admission  medications   Medication Sig Start Date End Date Taking? Authorizing Provider  albuterol (PROAIR HFA) 108 (90 Base) MCG/ACT inhaler Inhale 2 puffs into the lungs every 4 (four) hours as needed for wheezing or shortness of breath. 03/24/16   de Dios, [place] A, MD  albuterol (PROVENTIL) (2.5 MG/3ML) 0.083% nebulizer solution Take 3 mLs (2.5 mg total) by nebulization every 4 (four) hours as needed for wheezing or shortness of breath. 07/31/16   Collene Gobble, MD  aspirin EC 81 MG tablet Take 1 tablet (81 mg total) by mouth daily. 02/26/16   Arbutus Leas, NP  Calcium Carbonate-Vitamin D (CALCIUM-VITAMIN D) 500-200 MG-UNIT per tablet Take 1 tablet by mouth 2 (two) times daily.     [provider]  Cholecalciferol (VITAMIN D) 2000 UNITS CAPS Take 1 capsule by mouth daily.    [provider]  clopidogrel (PLAVIX) 75 MG tablet Take 1 tablet (75 mg total) by mouth daily. 04/01/17   Sherren Mocha, MD  denosumab (PROLIA) 60 MG/ML SOLN injection Inject 60 mg into the skin every 6 (six) months. Administer in upper arm, thigh, or abdomen 06/13/15   Ria Bush, MD  fluticasone [facility]) 50 MCG/ACT nasal spray Place 2 sprays into both nostrils daily. 07/31/16   Collene Gobble, MD  furosemide (LASIX) 20 MG tablet Take 2 tablets by mouth daily 12/10/16   Sherren Mocha, MD  ipratropium-albuterol (DUONEB) 0.5-2.5 (3) MG/3ML SOLN INHALE 1 VIAL VIA NEBULIZER 4 TIMES DAILY AND EVERY 4 HOURS AS NEEDED 04/02/17   Collene Gobble, MD  isosorbide mononitrate (IMDUR) 30 MG 24 hr tablet TAKE 1 TABLET BY MOUTH DAILY 05/25/16   Sherren Mocha, MD  loratadine (CLARITIN) 10 MG tablet Take 10 mg by mouth daily.    [provider]  losartan (COZAAR) 25 MG tablet Take 0.5 tablets (12.5 mg total) by mouth daily. 07/10/16   Alvy Alsop, PA  LOTEMAX 0.5 % ophthalmic suspension Place 1 drop into both eyes daily.  12/27/13   [provider]  nitroGLYCERIN (NITROSTAT) 0.4 MG SL tablet Place  1 tablet (0.4 mg total) under the tongue every 5 (five) minutes as needed for chest pain. 07/03/15   Richardson Dopp T, PA-C  pantoprazole (PROTONIX) 40 MG tablet Take 1 tablet (40 mg total) by mouth daily. 03/01/17   Sherren Mocha, MD  simvastatin (ZOCOR) 40 MG tablet Take 1 tablet (40 mg total) by mouth at bedtime. 02/26/17   Sherren Mocha, MD  Spacer/Aero-Holding Chambers (AEROCHAMBER MV) inhaler Use as instructed 12/28/16   Collene Gobble, MD  spironolactone (ALDACTONE) 25 MG tablet TAKE 1/2 TABLET BY MOUTH ONCE DAILY 04/14/16   Sherren Mocha, MD    Allergies:   Actonel [risedronate sodium]; Amlodipine; Fosamax [alendronate sodium]; Lisinopril; Pravastatin; and Codeine   Social History   Social History  . Marital status: Widowed    Spouse name: N/A  . Number of children: 1  . Years of education: N/A   Occupational History  . retired     Insurance underwriter   Social History Main Topics  .  Smoking status: Former Smoker    Types: Cigarettes    Quit date: 07/27/2002  . Smokeless tobacco: Never Used  . Alcohol use No  . Drug use: No  . Sexual activity: No   Other Topics Concern  . None   Social History Narrative   The patient is a widow and has one son and one dog.     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  She does not drink alcohol. 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   Ed: HS   Activity: no regular exercise   Diet: some water, some fruits/vegetables    Lives in a one story home.      Family History:  The patient's family history includes Breast cancer in her sister; CAD (age of onset: 48) in her father and mother; Diabetes in her sister; Heart attack in her mother; Hypertension in her mother; Stroke in her father and mother.   ROS:   Please see the history of present illness.    ROS All other systems reviewed and are negative.   PHYSICAL EXAM:   VS:  BP 128/85   Pulse 74   Ht 5\' 6"  (1.676 m)   Wt 126 lb 6.4 oz  (57.3 kg)   SpO2 97%   BMI 20.40 kg/m    GEN: Well nourished, well developed, in no acute distress  HEENT: normal  Neck: no JVD, carotid bruits, or masses Cardiac:RRR; no murmurs, rubs, or gallops,no edema  Respiratory:  Diminished breath sound throughout  GI: soft, nontender, nondistended, + BS MS: no deformity or atrophy  Skin: warm and dry, no rash Neuro:  Alert and Oriented x 3, Strength and sensation are intact Psych: euthymic mood, full affect  Wt Readings from Last 3 Encounters:  04/06/17 126 lb 6.4 oz (57.3 kg)  03/24/17 128 lb (58.1 kg)  01/05/17 120 lb (54.4 kg)      Studies/Labs Reviewed:   EKG:  EKG is ordered today.  The ekg ordered today demonstrates Sinus rhythm at rate of 74 bpm with PACs and PVCs. Nonspecific ST changes.  Recent Labs: 04/28/2016: TSH 1.54 07/08/2016: ALT 21; B Natriuretic Peptide 528.2 11/25/2016: BUN 22; Creatinine, Ser 1.08; Potassium 4.0; Sodium 138 01/05/2017: Hemoglobin 14.9; Platelets 240   Lipid Panel    Component Value Date/Time   CHOL 139 07/08/2016 0800   TRIG 71 07/08/2016 0800   HDL 65 07/08/2016 0800   CHOLHDL 2.1 07/08/2016 0800   VLDL 14 07/08/2016 0800   LDLCALC 60 07/08/2016 0800   LDLDIRECT 50.0 11/25/2016 1131    Additional studies/ records that were reviewed today include:   Echocardiogram: 06/2016 Study Conclusions  - Left ventricle: Septal inferior and inferior lateral wall   hypokinesis The cavity size was moderately dilated. Wall   thickness was increased in a pattern of mild LVH. Systolic   function was moderately to severely reduced. The estimated   ejection fraction was in the range of 30% to 35%. - Aortic valve: There was mild regurgitation. - Mitral valve: There was moderate regurgitation. - Left atrium: The atrium was mildly dilated. - Right atrium: The atrium was mildly dilated. - Atrial septum: No defect or patent foramen ovale was identified. - Tricuspid valve: There was moderate  regurgitation. - Pulmonary arteries: PA peak pressure: 39 mm Hg (S).   Cardiac Catheterization: 07/09/16 Coronary Stent Intervention  Left Heart Cath and Coronary Angiography  Conclusion  There is severe left ventricular systolic dysfunction.  LV end diastolic pressure is normal.  The left ventricular ejection fraction is 25-35% by visual estimate.  Prox Cx to Mid Cx lesion, 10 %stenosed.  Dist LAD lesion, 100 %stenosed.  Ost 1st Diag lesion, 80 %stenosed.  Prox LAD lesion, 75 %stenosed.  A STENT PROMUS PREM MR 3.0X8 drug eluting stent was successfully placed.  Post intervention, there is a 0% residual stenosis.   1. Single vessel obstructive CAD. The stent in the LCx is still patent. There is occlusion of the very distal LAD that is old. There is a hazy ruptured plaque in the mid LAD that is felt to be the culprit lesion. The first diagonal has a moderate ostial stenosis 2. Severe LV dysfunction with normal LVEDP 3. Successful stenting of the mid LAD with DES.  Plan: continue DAPT, medical management of residual disease.     ASSESSMENT & PLAN:    1. CAD s/p BMS to LCx 2010 and DES to pro LAD 06/2016 - She has residual disease on recent cath as noted above. Tx with medical therapy. No chest pain. Continue ASA, Plavix and statin.   2. ICM/ Acute on chronic systolic CHF - EF of 00-86%. Normal LVEDP by cath. Continue lasix 40mg  qd and spironolactone 25mg  qd. Marland Kitchen Hx of cough on lisinopril. Lostartan discontinued due to hypotension in past.  She has not been able to tolerate beta blockers. Euvolemic on exam. Compliant with low sodium diet. Advised to take extra 20 mg of Lasix for intermittent edema. BP stable now. Consider restarting Losartan during follow up.   3.COPD with hx of Lung cancer - Recently treated with antibiotics and prednisone for COPD exacerbation. Followed by pulmonologist.  4. HTN - Stable and well controlled.   5. HLD - 07/08/2016: Cholesterol  139; HDL 65; LDL Cholesterol 60; Triglycerides 71; VLDL 14 - Continue statin.   6. Bradycardia with dizziness - Noted during pulmonologist appointment. Her pulse and blood pressure is stable today. Noted intermittent bradycardic in 40s at home with poor filling of fatigue, tiredness and dizziness. Concern regarding intermittent bradycardia/conduction disease abnormality. Advised to get vital signs when feeling poor. Get a 48-hour monitor. She is not on any AV blocking agent. She can not lay down for orthostatic vitals due to chronic back and lung issue. Go to ER if worsening of symptoms.     Medication Adjustments/Labs and Tests Ordered: Current medicines are reviewed at length with the patient today.  Concerns regarding medicines are outlined above.  Medication changes, Labs and Tests ordered today are listed in the Patient Instructions below. Patient Instructions  Medication Instructions:    Your physician recommends that you continue on your current medications as directed. Please refer to the Current Medication list given to you today.   If you need a refill on your cardiac medications before your next appointment, please call your pharmacy.  Labwork: NONE ORDERED  TODAY    Testing/Procedures: Your physician has recommended that you wear a holter monitor. Holter monitors are medical devices that record the heart's electrical activity. Doctors most often use these monitors to diagnose arrhythmias. Arrhythmias are problems with the speed or rhythm of the heartbeat. The monitor is a small, portable device. You can wear one while you do your normal daily activities. This is usually used to diagnose what is causing palpitations/syncope (passing out).     Follow-Up: WITH DR Burt Knack AS PLANNED    Any Other Special Instructions Will Be Listed Below (If  Applicable).                                                                                                                                                   Jarrett Soho, PA  04/06/2017 12:23 PM    [facility] [address], [place], [place]  [address] Phone: [phone]; Fax: [phone]

## 2017-04-02 NOTE — Telephone Encounter (Signed)
Lm on pts vm and informed her we have received funds

## 2017-04-06 ENCOUNTER — Encounter: Payer: Self-pay | Admitting: Physician Assistant

## 2017-04-06 ENCOUNTER — Ambulatory Visit (INDEPENDENT_AMBULATORY_CARE_PROVIDER_SITE_OTHER): Payer: PPO | Admitting: Physician Assistant

## 2017-04-06 VITALS — BP 128/85 | HR 74 | Ht 66.0 in | Wt 126.4 lb

## 2017-04-06 DIAGNOSIS — E785 Hyperlipidemia, unspecified: Secondary | ICD-10-CM | POA: Diagnosis not present

## 2017-04-06 DIAGNOSIS — R42 Dizziness and giddiness: Secondary | ICD-10-CM

## 2017-04-06 DIAGNOSIS — I10 Essential (primary) hypertension: Secondary | ICD-10-CM

## 2017-04-06 DIAGNOSIS — R001 Bradycardia, unspecified: Secondary | ICD-10-CM

## 2017-04-06 DIAGNOSIS — I5022 Chronic systolic (congestive) heart failure: Secondary | ICD-10-CM

## 2017-04-06 DIAGNOSIS — I2510 Atherosclerotic heart disease of native coronary artery without angina pectoris: Secondary | ICD-10-CM | POA: Diagnosis not present

## 2017-04-06 NOTE — Patient Instructions (Signed)
Medication Instructions:    Your physician recommends that you continue on your current medications as directed. Please refer to the Current Medication list given to you today.   If you need a refill on your cardiac medications before your next appointment, please call your pharmacy.  Labwork: NONE ORDERED  TODAY    Testing/Procedures: Your physician has recommended that you wear a holter monitor. Holter monitors are medical devices that record the heart's electrical activity. Doctors most often use these monitors to diagnose arrhythmias. Arrhythmias are problems with the speed or rhythm of the heartbeat. The monitor is a small, portable device. You can wear one while you do your normal daily activities. This is usually used to diagnose what is causing palpitations/syncope (passing out).     Follow-Up: WITH DR Burt Knack AS PLANNED    Any Other Special Instructions Will Be Listed Below (If Applicable).                                                                                                                                                  \

## 2017-04-07 ENCOUNTER — Ambulatory Visit (INDEPENDENT_AMBULATORY_CARE_PROVIDER_SITE_OTHER): Payer: PPO

## 2017-04-07 DIAGNOSIS — R42 Dizziness and giddiness: Secondary | ICD-10-CM

## 2017-04-13 ENCOUNTER — Other Ambulatory Visit: Payer: Self-pay | Admitting: Emergency Medicine

## 2017-04-15 ENCOUNTER — Other Ambulatory Visit: Payer: Self-pay | Admitting: Cardiovascular Disease

## 2017-04-15 ENCOUNTER — Ambulatory Visit (INDEPENDENT_AMBULATORY_CARE_PROVIDER_SITE_OTHER): Payer: PPO | Admitting: Cardiovascular Disease

## 2017-04-15 DIAGNOSIS — I5042 Chronic combined systolic (congestive) and diastolic (congestive) heart failure: Secondary | ICD-10-CM

## 2017-04-15 MED ORDER — FUROSEMIDE 40 MG PO TABS: 40.0000 mg | ORAL_TABLET | Freq: Every day | ORAL | 11 refills | Status: DC

## 2017-04-15 NOTE — Progress Notes (Signed)
Cardiology Office Note Date:  04/16/2017   ID:  [name], DOB 08/08/1940, MRN [id number]  PCP:  Ria Bush, MD  Cardiologist:  Sherren Mocha, MD    Chief Complaint  Patient presents with  . Low Pulse     History of Present Illness: [name] is a 76 y.o. female who presents for follow-up of bradycardia. Her history is outlined in a recent office note 04/06/2017. A holter monitor was placed to evaluate for fatigue and low heart rate and she returns today to review results and treatment plan. She denies any change in her symptoms since placement of the monitor. Unfortunately the monitor has not yet been processed so I don't have the results at the time of my evaluation.    Past Medical History:  Diagnosis Date  . Arthritis   . CAD (coronary artery disease) 03/2009   s/p MI  . Chronic combined systolic and diastolic heart failure ([place]) 06/17/2009   Qualifier: Diagnosis of  By: Burt Knack, MD, Clayburn Pert   . CKD (chronic kidney disease) stage 3, GFR 30-59 ml/min   . Emphysema/COPD ([place])   . Ex-smoker quit 2008  . GERD (gastroesophageal reflux disease)   . HCAP (healthcare-associated pneumonia) 03/19/2016  . Helicobacter pylori gastritis 10/2007   treated  . History of CVA (cerebrovascular accident) without residual deficits 01/2012, 06/2012   R hemorrhagic MCA 01/2012 with remote lacunar infarct L putamen and IC, rpt 06/2012 acute multifocal R MCA infarct with remote hemorrhagic strokes affecting L basal ganglia and periventricular white matter, full recovery  . HLD (hyperlipidemia)   . HTN (hypertension)   . Ischemic cardiomyopathy 02/27/2014  . Lung cancer ([place]) dx'd 07/2006   Lung CA, s/p resection, followed by Dr Earlie Server  . Macular degeneration   . Myocardial infarction [facility]) 03/2009   Acute myocardial infarction 2010 - treated with BMS of LCx. LVEF 50% with subsequent CHF  . Osteoporosis 09/2013   T -3.6 forearm 09/2013, T -4.5 forearm 04/2015  . Stroke [facility])      Past Surgical History:  Procedure Laterality Date  . BREAST BIOPSY Left   . CARDIAC CATHETERIZATION N/A 02/24/2016   Procedure: Left Heart Cath and Coronary Angiography;  Surgeon: Belva Crome, MD;  Location: [place] [facility];  Service: Cardiovascular;  Laterality: N/A;  . CARDIAC CATHETERIZATION N/A 07/09/2016   Procedure: Left Heart Cath and Coronary Angiography;  Surgeon: Peter M Martinique, MD;  Location: [place] [facility];  Service: Cardiovascular;  Laterality: N/A;  . CARDIAC CATHETERIZATION N/A 07/09/2016   Procedure: Coronary Stent Intervention;  Surgeon: Peter M Martinique, MD;  Location: [place] [facility];  Service: Cardiovascular;  Laterality: N/A;  . CATARACT EXTRACTION Bilateral   . CORONARY STENT PLACEMENT  06/2016   DES to mid LAD  . EYE SURGERY    . LUNG REMOVAL, PARTIAL Right 2008  . PARTIAL HYSTERECTOMY  1986   irregular periods, ovaries remain    Current Outpatient Prescriptions  Medication Sig Dispense Refill  . ipratropium-albuterol (DUONEB) 0.5-2.5 (3) MG/3ML SOLN Take 3 mLs by nebulization every 4 (four) hours as needed (wheezing or shortness of breath).    Marland Kitchen albuterol (PROAIR HFA) 108 (90 Base) MCG/ACT inhaler Inhale 2 puffs into the lungs every 4 (four) hours as needed for wheezing or shortness of breath. 1 Inhaler 1  . albuterol (PROVENTIL) (2.5 MG/3ML) 0.083% nebulizer solution Take 3 mLs (2.5 mg total) by nebulization every 4 (four) hours as needed for wheezing or shortness of  breath. 150 mL 5  . aspirin EC 81 MG tablet Take 1 tablet (81 mg total) by mouth daily.    . Calcium Carbonate-Vitamin D (CALCIUM-VITAMIN D) 500-200 MG-UNIT per tablet Take 1 tablet by mouth 2 (two) times daily.     . Cholecalciferol (VITAMIN D) 2000 UNITS CAPS Take 1 capsule by mouth daily.    . clopidogrel (PLAVIX) 75 MG tablet Take 1 tablet (75 mg total) by mouth daily. 30 tablet 11  . denosumab (PROLIA) 60 MG/ML SOLN injection Inject 60 mg into the skin every 6 (six) months.  Administer in upper arm, thigh, or abdomen 60 mL 1  . fluticasone (FLONASE) 50 MCG/ACT nasal spray INSTILL TWO SPRAYS INTO BOTH NOSTRILS DAILY. 16 g 5  . furosemide (LASIX) 40 MG tablet Take 1 tablet (40 mg total) by mouth daily. Take an additional 1/2 tablet daily for swelling. 40 tablet 11  . isosorbide mononitrate (IMDUR) 30 MG 24 hr tablet TAKE 1 TABLET BY MOUTH DAILY 30 tablet 10  . loratadine (CLARITIN) 10 MG tablet Take 10 mg by mouth daily.    Marland Kitchen LOTEMAX 0.5 % ophthalmic suspension Place 1 drop into both eyes daily.     . nitroGLYCERIN (NITROSTAT) 0.4 MG SL tablet Place 1 tablet (0.4 mg total) under the tongue every 5 (five) minutes as needed for chest pain. 25 tablet 3  . pantoprazole (PROTONIX) 40 MG tablet Take 1 tablet (40 mg total) by mouth daily. 30 tablet 7  . simvastatin (ZOCOR) 40 MG tablet Take 1 tablet (40 mg total) by mouth at bedtime. 30 tablet 6  . Spacer/Aero-Holding Chambers (AEROCHAMBER MV) inhaler Use as instructed 1 each 0  . spironolactone (ALDACTONE) 25 MG tablet TAKE 1/2 TABLET BY MOUTH ONCE DAILY 30 tablet 11   No current facility-administered medications for this visit.     Allergies:   Actonel [risedronate sodium]; Amlodipine; Fosamax [alendronate sodium]; Lisinopril; Pravastatin; and Codeine   Social History:  The patient  reports that she quit smoking about 14 years ago. Her smoking use included Cigarettes. She has never used smokeless tobacco. She reports that she does not drink alcohol or use drugs.   Family History:  The patient's  family history includes Breast cancer in her sister; CAD (age of onset: 65) in her father and mother; Diabetes in her sister; Heart attack in her mother; Hypertension in her mother; Stroke in her father and mother.   PHYSICAL EXAM: VS:  There were no vitals taken for this visit. , BMI There is no height or weight on file to calculate BMI. GEN: Frail elderly woman, in no acute distress  HEENT: normal  Neck: no JVD, no masses.   Cardiac: RRR without murmur or gallop                Respiratory:  Diminished air movement bilaterally GI: soft, nontender, nondistended, + BS MS: no deformity or atrophy  Ext: trace bilateral pretibial edema Skin: warm and dry, no rash Neuro:  Strength and sensation are intact Psych: euthymic mood, full affect  EKG:  EKG is not ordered today.  Recent Labs: 04/28/2016: TSH 1.54 07/08/2016: ALT 21; B Natriuretic Peptide 528.2 11/25/2016: BUN 22; Creatinine, Ser 1.08; Potassium 4.0; Sodium 138 01/05/2017: Hemoglobin 14.9; Platelets 240   Lipid Panel     Component Value Date/Time   CHOL 139 07/08/2016 0800   TRIG 71 07/08/2016 0800   HDL 65 07/08/2016 0800   CHOLHDL 2.1 07/08/2016 0800   VLDL 14 07/08/2016 0800  LDLCALC 60 07/08/2016 0800   LDLDIRECT 50.0 11/25/2016 1131      Wt Readings from Last 3 Encounters:  04/06/17 57.3 kg (126 lb 6.4 oz)  03/24/17 58.1 kg (128 lb)  01/05/17 54.4 kg (120 lb)    ASSESSMENT AND PLAN: Bradycardia with dizziness/fatigue. Hx of hypotension with medical therapy for CHF, currently on minimal Rx. Holter monitor not yet processed. Heart rate regular and 70 bpm today. I advised her I will contact her when monitor is processed and interpreted. No changes are made today to her medications. No charge is submitted for today's visit.   Current medicines are reviewed with the patient today.  The patient does not have concerns regarding medicines.  Labs/ tests ordered today include:   Orders Placed This Encounter  Procedures  . EKG 12-Lead    Disposition:   FU as planned  Signed, Sherren Mocha, MD  04/16/2017 7:05 AM    [facility] [address], [place], [place]  [address] Phone: [phone]; Fax: [phone]

## 2017-04-15 NOTE — Patient Instructions (Addendum)

## 2017-04-16 ENCOUNTER — Encounter: Payer: Self-pay | Admitting: Cardiovascular Disease

## 2017-04-20 ENCOUNTER — Ambulatory Visit
Admission: RE | Admit: 2017-04-20 | Discharge: 2017-04-20 | Disposition: A | Discharge status: Home / Self Care | Payer: PPO | Source: Ambulatory Visit | Attending: Cardiovascular Disease | Admitting: Cardiovascular Disease

## 2017-04-20 DIAGNOSIS — R42 Dizziness and giddiness: Secondary | ICD-10-CM | POA: Diagnosis not present

## 2017-04-26 ENCOUNTER — Telehealth: Payer: Self-pay | Admitting: Cardiovascular Disease

## 2017-04-26 NOTE — Telephone Encounter (Signed)
Informed patient that results have not been sent to Nursing, but reviewed overview: Study Highlights   The basic rhythm is sinus.  There are no significant bradyarrhythmias or pathologic pauses > 3 seconds There are frequent PVC's (22%), as well as short ventricular runs No atrial fibrillation or sustained tachyarrhythmias   Recommended patient try heart healthy Coricidin products for OTC decongestant. Instructed her to call PCP if symptoms do not get better.

## 2017-04-26 NOTE — Telephone Encounter (Signed)
New message     Pt niece calling for results for holter monitor and would also like you to know pt is having a lot of congestion in her chest.

## 2017-04-27 ENCOUNTER — Other Ambulatory Visit (INDEPENDENT_AMBULATORY_CARE_PROVIDER_SITE_OTHER): Payer: PPO

## 2017-04-27 ENCOUNTER — Other Ambulatory Visit: Payer: PPO

## 2017-04-27 ENCOUNTER — Telehealth: Payer: Self-pay | Admitting: Emergency Medicine

## 2017-04-27 DIAGNOSIS — I5042 Chronic combined systolic (congestive) and diastolic (congestive) heart failure: Secondary | ICD-10-CM

## 2017-04-27 LAB — FECAL OCCULT BLOOD, IMMUNOCHEMICAL: FECAL OCCULT BLD: NEGATIVE

## 2017-04-27 LAB — FECAL OCCULT BLOOD, GUAIAC: Fecal Occult Blood: NEGATIVE

## 2017-04-27 MED ORDER — PREDNISONE 10 MG PO TABS: ORAL_TABLET | ORAL | 0 refills | Status: DC

## 2017-04-27 MED ORDER — AZITHROMYCIN 250 MG PO TABS: ORAL_TABLET | ORAL | 0 refills | Status: DC

## 2017-04-27 NOTE — Telephone Encounter (Signed)
Ok, Zpack #1 , tad  Prednisone 10mg  4 tabs for 2 days, then 3 tabs for 2 days, 2 tabs for 2 days, then 1 tab for 2 days, then stop #20 , No refills.  Mucinex DM As needed   If not improving will need ov  Please contact office for sooner follow up if symptoms do not improve or worsen or seek emergency care

## 2017-04-27 NOTE — Telephone Encounter (Signed)
Spoke with pt,   Spoke with Almyra Free, she states pt has chest congestion, unable to cough up phlegm but when she does its brown. Denies fever and has been using albuterol and rescue inhaler everyday for the last 4-5 days. When this happened the last time, RB called in Pred and ABX and it helped.   On 03/09/2017 she received Zpak and Pred taper according to chart. Please advise.    [place]

## 2017-04-27 NOTE — Telephone Encounter (Signed)
Spoke with niece Almyra Free, aware of recs.  rx's sent to preferred pharmacy.  Nothing further needed.

## 2017-04-28 ENCOUNTER — Encounter: Payer: Self-pay | Admitting: Family Medicine

## 2017-04-28 ENCOUNTER — Ambulatory Visit: Payer: PPO | Admitting: Cardiovascular Disease

## 2017-04-29 ENCOUNTER — Other Ambulatory Visit: Payer: Self-pay | Admitting: Cardiovascular Disease

## 2017-05-03 ENCOUNTER — Telehealth: Payer: Self-pay | Admitting: Emergency Medicine

## 2017-05-03 ENCOUNTER — Ambulatory Visit (INDEPENDENT_AMBULATORY_CARE_PROVIDER_SITE_OTHER)
Admission: RE | Admit: 2017-05-03 | Discharge: 2017-05-03 | Disposition: A | Discharge status: Home / Self Care | Payer: PPO | Source: Ambulatory Visit | Attending: Emergency Medicine | Admitting: Emergency Medicine

## 2017-05-03 ENCOUNTER — Ambulatory Visit (INDEPENDENT_AMBULATORY_CARE_PROVIDER_SITE_OTHER): Payer: PPO | Admitting: Emergency Medicine

## 2017-05-03 ENCOUNTER — Encounter: Payer: Self-pay | Admitting: Emergency Medicine

## 2017-05-03 VITALS — BP 146/92 | HR 80 | Ht 66.0 in | Wt 129.0 lb

## 2017-05-03 DIAGNOSIS — R05 Cough: Secondary | ICD-10-CM | POA: Diagnosis not present

## 2017-05-03 DIAGNOSIS — R0600 Dyspnea, unspecified: Secondary | ICD-10-CM | POA: Diagnosis not present

## 2017-05-03 MED ORDER — DOXYCYCLINE HYCLATE 100 MG PO TABS: 100.0000 mg | ORAL_TABLET | Freq: Two times a day (BID) | ORAL | 0 refills | Status: DC

## 2017-05-03 NOTE — Progress Notes (Signed)
Subjective:    Patient ID: [name], female    DOB: 20-Dec-1940, 76 y.o.   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 Also history of COPD. We have been following pulmonary nodules. On her most recent CT scan 11/30/16 there was an enlargement in her left lower lobe posterior nodule. This prompted [place] to perform a PET scan that was done on 12/22/16 that I have personally reviewed. This shows a slight increase in moderate hypermetabolism in both the left lower lobe and right lower lobe nodules corresponding with a very slow increase in size since 2016. Some mild hypermetabolism noted in the mediastinal lymph nodes as well as a right axillary node that is hypermetabolic, unclear significance.                        ROV 03/24/17 -- follow-up visit for patient with a history of COPD, right lower lobe superior segmental adenocarcinoma status post resection. We have been following pulmonary nodules, had planned for a needle biopsy 01/05/17 but at this time the nodular lesion was smaller and less prominent. She has been dealing with URI sx, a flare that was treated with pred + azithro. She is taking albuterol / ipratropium nebs tid.                 She also tells me that she has observed symptomatic bradycardia - in the am when she wakes, sometimes HR is in 30's. This correlates with times that she feels bad - fatigued, weak, dyspneic. She does have a history of coronary disease,  no rhythm disturbance to my knowledge.  She is not on any rate control medications.          ROV 05/03/17 -- Patient has a history of COPD, right lower lobe superior segmental resection for adenocarcinoma, pulmonary nodule that we have followed with serial CT scans of the chest, smaller on her most recent attempt at needle biopsy.  She has had more dyspnea for the last week, some cough that was productive, now less so. She has been  tachypneic. She is on DuoNeb 3x a day.   Chest x-ray today hyperinflated with prominent vascular markings, large pulmonary artery versus lobe infiltrate                                                     Review of Systems     As per history of present illness    Objective:   Physical Exam Vitals:   05/03/17 1426 05/03/17 1429  BP:  (!) 146/92  Pulse:  80  SpO2:  96%  Weight: 129 lb (58.5 kg)   Height: 5\' 6"  (1.676 m)     Gen: Anxious, elderly woman, in mild resp distress  ENT: No lesions,  mouth clear,  oropharynx clear, no postnasal drip, weak hoarse voice  Neck: No JVD, no stridor  Lungs: tachypneic, small breaths but no wheezing. She is able to force an expiration  Cardiovascular: RRR, heart sounds normal, no murmur or gallops, no peripheral edema  Musculoskeletal: No deformities, no cyanosis or clubbing  Neuro: alert, non focal  Skin: Warm, no lesions or rashes   CT chest 11/30/16 --  COMPARISON:  Chest CT 06/22/2016.  FINDINGS: Cardiovascular: Heart size is normal. There is no significant pericardial fluid, thickening or pericardial calcification. There is aortic atherosclerosis, as well as atherosclerosis of the great vessels of the mediastinum and the coronary arteries, including calcified atherosclerotic plaque in the left main, left anterior descending, left circumflex and right coronary arteries. Left circumflex coronary artery stent.  Mediastinum/Nodes: No pathologically enlarged mediastinal or hilar lymph nodes. Please note that accurate exclusion of hilar adenopathy is limited on noncontrast CT scans. Esophagus is unremarkable in appearance. No axillary lymphadenopathy.  Lungs/Pleura: Several nodular appearing areas of architectural distortion are again noted in the lungs bilaterally. The largest and most concerning of these is in the superior segment of the left lower lobe (axial image 62 of series 3) which currently measures 1.6 x 2.2 cm  (previously 1.5 cm on 06/22/2016), and in the posterior aspect of the right lower lobe (image 110 of series 3) measuring 2.5 x 1.0 cm (previously 1.9 cm). Previously noted nodular areas of architectural distortion in the right middle lobe have resolved compared to the prior study, indicative of a benign infectious or inflammatory etiology on the prior study. Nodular areas of pleural-parenchymal thickening are again noted in the apices of the lungs bilaterally, most compatible with chronic post infectious or inflammatory scarring. Suture line in the right lower lobe, compatible with prior wedge resection. No acute consolidative airspace disease. No pleural effusions. Mild diffuse bronchial wall thickening with moderate centrilobular and paraseptal emphysema.  Upper Abdomen: Aortic atherosclerosis.  Musculoskeletal: There are no aggressive appearing lytic or blastic lesions noted in the visualized portions of the skeleton.  IMPRESSION: 1. Resolution of previously noted right middle lobe pulmonary nodules, indicative of a benign infectious or inflammatory etiology on the prior study. 2. Slow progressive enlargement of 2 nodules in the superior segment of the left lower lobe and in the periphery of the right lower lobe when compared to numerous prior examinations. These findings remain concerning for potential neoplasm. Further evaluation with PET-CT is suggested in the near future. 3. Diffuse bronchial wall thickening with moderate centrilobular and paraseptal emphysema; imaging findings compatible with the reported clinical history of COPD. 4. Aortic atherosclerosis, in addition to left main and 3 vessel coronary artery disease. Assessment for potential risk factor modification, dietary therapy or pharmacologic therapy may be warranted, if clinically indicated. 5. Additional incidental findings, as above      Assessment & Plan:  Dyspnea Significant dyspnea, tachypnea even at  rest. This is in absence of any wheezing or crackles. Her chest x-ray is reassuring. Question whether there may be a component of anxiety, panic here. This is especially true given her failure to respond to recent prednisone taper. I think it would be reasonable to try and maximize her therapy, treat for an exacerbation, but in absence of improvement I believe we need to explore other possible contributing. We will eval for occult hypoxemia today.   Baltazar Apo, MD, PhD 05/03/2017, 3:36 PM [facility] [phone] or if no answer [phone]

## 2017-05-03 NOTE — Assessment & Plan Note (Signed)
Significant dyspnea, tachypnea even at rest. This is in absence of any wheezing or crackles. Her chest x-ray is reassuring. Question whether there may be a component of anxiety, panic here. This is especially true given her failure to respond to recent prednisone taper. I think it would be reasonable to try and maximize her therapy, treat for an exacerbation, but in absence of improvement I believe we need to explore other possible contributing. We will eval for occult hypoxemia today.

## 2017-05-03 NOTE — Telephone Encounter (Signed)
Melvenia Needles, NP      04/27/17 4:58 PM  Note    Ok, Zpack #1 , tad  Prednisone 10mg  4 tabs for 2 days, then 3 tabs for 2 days, 2 tabs for 2 days, then 1 tab for 2 days, then stop #20 , No refills.  Mucinex DM As needed   If not improving will need ov  Please contact office for sooner follow up if symptoms do not improve or worsen or seek emergency care       Spoke with pt, she states she will come in but Ai advised her to go to the ED because she seemed like it was hard to talk and breathe on the phone. She stated she would call back after she spoke to her niece to see what time she is a she can take her. Will await a call back.

## 2017-05-03 NOTE — Telephone Encounter (Signed)
Spoke with pt's niece, Almyra Free. Advised her that she would really need to take to the pt to the ED. Almyra Free verbalized understanding. Nothing further was needed.

## 2017-05-03 NOTE — Telephone Encounter (Signed)
Spoke with Thayer Headings, her daughter and she states pt will not go to the ED. RB has a 2:30 available but she doesn't want to wait that long. RB can we squeeze her in this AM. It will take them 30 minutes to get here.

## 2017-05-03 NOTE — Patient Instructions (Addendum)
Please use your DuoNeb four times a day  Take prednisone as directed until completely gone Take doxycycline until completely gone.  Take albuterol 2 puffs up to every 4 hours if needed for shortness of breath.  Walking oximetry today on room air Follow with APP in 2 weeks Follow with Dr Lamonte Sakai in 1 month or next available.

## 2017-05-03 NOTE — Telephone Encounter (Signed)
Spoke with Thayer Headings. She has been scheduled for 230 this afternoon with RB. Nothing else needed at time of call.

## 2017-05-03 NOTE — Telephone Encounter (Signed)
Spoke with Ria Comment. Patient is have Doxy. Will go ahead and send this in to pharmacy.

## 2017-05-13 ENCOUNTER — Telehealth: Payer: Self-pay | Admitting: *Deleted

## 2017-05-13 NOTE — Telephone Encounter (Signed)
Information has been submitted to pts insurance for verification of benefits. Awaiting response for coverage  

## 2017-05-17 ENCOUNTER — Encounter: Payer: Self-pay | Admitting: Adult Health

## 2017-05-17 ENCOUNTER — Ambulatory Visit (INDEPENDENT_AMBULATORY_CARE_PROVIDER_SITE_OTHER): Payer: PPO | Admitting: Adult Health

## 2017-05-17 DIAGNOSIS — R918 Other nonspecific abnormal finding of lung field: Secondary | ICD-10-CM

## 2017-05-17 DIAGNOSIS — J431 Panlobular emphysema: Secondary | ICD-10-CM

## 2017-05-17 NOTE — Assessment & Plan Note (Signed)
Recent flare now resolving   Plan  Patient Instructions  Continue on DuoNeb 4 times daily. Follow up for CT chest in December as planned Follow Dr. Lamonte Sakai in 1 month as planned and as needed

## 2017-05-17 NOTE — Progress Notes (Signed)
@Patient  ID: [name], female    DOB: Apr 19, 1941, 76 y.o.   MRN: [id number]  Chief Complaint  Patient presents with  . Follow-up    COPD     Referring provider: Ria Bush, MD  HPI: 76 year old female followed for COPD. Patient has known pulmonary nodules . History of adenocarcinoma of the lung status post right lower lobe superior segmental resection in 2008  TEST  CT chest 11/2016 >Resolution of previously noted right middle lobe pulmonary nodules, indicative of a benign infectious or inflammatory etiology on the prior study. 2. Slow progressive enlargement of 2 nodules in the superior segment of the left lower lobe and in the periphery of the right lower lobe, Emphysema   PET 11/2016 >Gradual mild increase in size and FDG uptake of bilateral lower lobe pulmonary nodules compared to previous studies dating back to in 2016. Sub-cm lymph nodes in right axilla, right hilum, and mediastinum with low-grade metabolic activity  CT chest 12/2016 >CT biopsy targeted LLL nodule was smaller , bx was defferred.   05/17/2017 Follow up : COPD , Lung nodules.  Patient returns for a two-week follow-up. Patient was seen last visit with a COPD exacerbation. She was treated with doxycycline and a prednisone taper. Patient is feeling better. Cough and congestion is decreased. Breathing is returning to baseline .  Remains on Duoneb Four times a day .   She has known pulmonary nodules that have been followed serially. PET showed 11/2016 low grade activity . She was set up for CT guided bx in June but LLL nodule was smaller and CT biopsy was deffered. She has follow up CT chest in 06/2017 .  Hx of Lung cancer in 2008 s/p resection.    Allergies  Allergen Reactions  . Actonel [Risedronate Sodium] Other (See Comments)    Headache  . Amlodipine Other (See Comments)    Pedal edema  . Fosamax [Alendronate Sodium] Other (See Comments)    Unable to tolerate  . Lisinopril Cough  .  Pravastatin Other (See Comments)    Constipation.  . Codeine Rash    Immunization History  Administered Date(s) Administered  . Influenza Split 03/29/2012, 05/08/2013  . Influenza, High Dose Seasonal PF 04/06/2017  . Influenza,inj,Quad PF,6+ Mos 02/24/2014, 04/08/2015, 04/01/2016  . Pneumococcal Conjugate-13 05/08/2015  . Pneumococcal Polysaccharide-23 07/27/2009  . Zoster 07/28/2011    Past Medical History:  Diagnosis Date  . Arthritis   . CAD (coronary artery disease) 03/2009   s/p MI  . Chronic combined systolic and diastolic heart failure ([place]) 06/17/2009   Qualifier: Diagnosis of  By: Burt Knack, MD, [place] Pert   . CKD (chronic kidney disease) stage 3, GFR 30-59 ml/min (HCC)   . Emphysema/COPD ([place])   . Ex-smoker quit 2008  . GERD (gastroesophageal reflux disease)   . HCAP (healthcare-associated pneumonia) 03/19/2016  . Helicobacter pylori gastritis 10/2007   treated  . History of CVA (cerebrovascular accident) without residual deficits 01/2012, 06/2012   R hemorrhagic MCA 01/2012 with remote lacunar infarct L putamen and IC, rpt 06/2012 acute multifocal R MCA infarct with remote hemorrhagic strokes affecting L basal ganglia and periventricular white matter, full recovery  . HLD (hyperlipidemia)   . HTN (hypertension)   . Ischemic cardiomyopathy 02/27/2014  . Lung cancer ([place]) dx'd 07/2006   Lung CA, s/p resection, followed by Dr Earlie Server  . Macular degeneration   . Myocardial infarction [facility]) 03/2009   Acute myocardial infarction 2010 - treated with BMS of LCx. LVEF 50% with  subsequent CHF  . Osteoporosis 09/2013   T -3.6 forearm 09/2013, T -4.5 forearm 04/2015  . Stroke [facility])     Tobacco History: History  Smoking Status  . Former Smoker  . Types: Cigarettes  . Quit date: 07/27/2002  Smokeless Tobacco  . Never Used   Counseling given: Not Answered   Outpatient Encounter Prescriptions as of 05/17/2017  Medication Sig  . albuterol (PROAIR HFA) 108 (90 Base) MCG/ACT  inhaler Inhale 2 puffs into the lungs every 4 (four) hours as needed for wheezing or shortness of breath.  Marland Kitchen albuterol (PROVENTIL) (2.5 MG/3ML) 0.083% nebulizer solution Take 3 mLs (2.5 mg total) by nebulization every 4 (four) hours as needed for wheezing or shortness of breath.  Marland Kitchen aspirin EC 81 MG tablet Take 1 tablet (81 mg total) by mouth daily.  . Calcium Carbonate-Vitamin D (CALCIUM-VITAMIN D) 500-200 MG-UNIT per tablet Take 1 tablet by mouth 2 (two) times daily.   . Cholecalciferol (VITAMIN D) 2000 UNITS CAPS Take 1 capsule by mouth daily.  . clopidogrel (PLAVIX) 75 MG tablet Take 1 tablet (75 mg total) by mouth daily.  Marland Kitchen denosumab (PROLIA) 60 MG/ML SOLN injection Inject 60 mg into the skin every 6 (six) months. Administer in upper arm, thigh, or abdomen  . fluticasone (FLONASE) 50 MCG/ACT nasal spray INSTILL TWO SPRAYS INTO BOTH NOSTRILS DAILY.  . furosemide (LASIX) 40 MG tablet Take 1 tablet (40 mg total) by mouth daily. Take an additional 1/2 tablet daily for swelling.  Marland Kitchen ipratropium-albuterol (DUONEB) 0.5-2.5 (3) MG/3ML SOLN Take 3 mLs by nebulization every 4 (four) hours as needed (wheezing or shortness of breath).  . isosorbide mononitrate (IMDUR) 30 MG 24 hr tablet TAKE 1 TABLET BY MOUTH DAILY  . loratadine (CLARITIN) 10 MG tablet Take 10 mg by mouth daily.  Marland Kitchen LOTEMAX 0.5 % ophthalmic suspension Place 1 drop into both eyes daily.   . nitroGLYCERIN (NITROSTAT) 0.4 MG SL tablet Place 1 tablet (0.4 mg total) under the tongue every 5 (five) minutes as needed for chest pain.  . pantoprazole (PROTONIX) 40 MG tablet Take 1 tablet (40 mg total) by mouth daily.  . simvastatin (ZOCOR) 40 MG tablet Take 1 tablet (40 mg total) by mouth at bedtime.  Marland Kitchen Spacer/Aero-Holding Chambers (AEROCHAMBER MV) inhaler Use as instructed  . spironolactone (ALDACTONE) 25 MG tablet TAKE 1/2 TABLET BY MOUTH EVERY DAY.  . [DISCONTINUED] azithromycin (ZITHROMAX) 250 MG tablet Take 2 today, then 1 daily until gone.  (Patient not taking: Reported on 05/17/2017)  . [DISCONTINUED] doxycycline (VIBRA-TABS) 100 MG tablet Take 1 tablet (100 mg total) by mouth 2 (two) times daily. (Patient not taking: Reported on 05/17/2017)  . [DISCONTINUED] predniSONE (DELTASONE) 10 MG tablet 40mg X2 days, 30mg  X2 days, 20mg  X2 days, 10mg X2 days, then stop. (Patient not taking: Reported on 05/17/2017)   No facility-administered encounter medications on file as of 05/17/2017.      Review of Systems  Constitutional:   No  weight loss, night sweats,  Fevers, chills, fatigue, or  lassitude.  HEENT:   No headaches,  Difficulty swallowing,  Tooth/dental problems, or  Sore throat,                No sneezing, itching, ear ache, nasal congestion, post nasal drip,   CV:  No chest pain,  Orthopnea, PND, swelling in lower extremities, anasarca, dizziness, palpitations, syncope.   GI  No heartburn, indigestion, abdominal pain, nausea, vomiting, diarrhea, change in bowel habits, loss of appetite, bloody stools.  Resp:   No chest wall deformity  Skin: no rash or lesions.  GU: no dysuria, change in color of urine, no urgency or frequency.  No flank pain, no hematuria   MS:  No joint pain or swelling.  No decreased range of motion.  No back pain.    Physical Exam  BP 124/62 (BP Location: Right Arm, Cuff Size: Normal)   Pulse 76   Ht 5\' 6"  (1.676 m)   Wt 129 lb 9.6 oz (58.8 kg)   SpO2 97%   BMI 20.92 kg/m   GEN: A/Ox3; pleasant , NAD , elderly    HEENT:  [place]/AT,  EACs-clear, TMs-wnl, NOSE-clear, THROAT-clear, no lesions, no postnasal drip or exudate noted.   NECK:  Supple w/ fair ROM; no JVD; normal carotid impulses w/o bruits; no thyromegaly or nodules palpated; no lymphadenopathy.    RESP  Decreased BS in bases , . no accessory muscle use, no dullness to percussion  CARD:  RRR, no m/r/g, no peripheral edema, pulses intact, no cyanosis or clubbing.  GI:   Soft & nt; nml bowel sounds; no organomegaly or masses detected.    Musco: Warm bil, no deformities or joint swelling noted.   Neuro: alert, no focal deficits noted.    Skin: Warm, no lesions or rashes    Lab Results:  CBC  BMET Imaging: Dg Chest 2 View  Result Date: 05/03/2017 CLINICAL DATA:  Sob x 3 days, cough x 1 wk , HTN, x-smoker, hx of lt lung ca, MI, cardiac cath, EXAM: CHEST  2 VIEW COMPARISON:  07/08/2016.  Chest CT, 01/05/2017. FINDINGS: The cardiac silhouette is mildly enlarged. No mediastinal or hilar masses. No convincing adenopathy. Right hilar surgical vascular clips and right perihilar pulmonary anastomosis staples reflect previous right lung surgery. This is also stable. No mediastinal or hilar masses.  No convincing adenopathy. Lungs are hyperexpanded. There are prominent bronchovascular markings. Mild scarring is noted in the left apex. These findings are stable. No evidence of pneumonia or pulmonary edema. No pleural effusion or pneumothorax. The skeletal structures are intact. IMPRESSION: No acute cardiopulmonary disease. Electronically Signed   By: Lajean Manes M.D.   On: 05/03/2017 15:16     Assessment & Plan:   COPD (chronic obstructive pulmonary disease) ([place]) Recent flare now resolving   Plan  Patient Instructions  Continue on DuoNeb 4 times daily. Follow up for CT chest in December as planned Follow Dr. Lamonte Sakai in 1 month as planned and as needed    Pulmonary nodules Lung nodules - will need to continue to follow serially on CT chest   Plan  Patient Instructions  Continue on DuoNeb 4 times daily. Follow up for CT chest in December as planned Follow Dr. Lamonte Sakai in 1 month as planned and as needed       Rexene Edison, NP 05/17/2017

## 2017-05-17 NOTE — Patient Instructions (Signed)
Continue on DuoNeb 4 times daily. Follow up for CT chest in December as planned Follow Dr. Lamonte Sakai in 1 month as planned and as needed

## 2017-05-17 NOTE — Assessment & Plan Note (Signed)
Lung nodules - will need to continue to follow serially on CT chest   Plan  Patient Instructions  Continue on DuoNeb 4 times daily. Follow up for CT chest in December as planned Follow Dr. Lamonte Sakai in 1 month as planned and as needed

## 2017-06-03 ENCOUNTER — Ambulatory Visit: Payer: PPO | Admitting: Family Medicine

## 2017-06-09 ENCOUNTER — Ambulatory Visit: Payer: PPO | Admitting: Family Medicine

## 2017-06-09 ENCOUNTER — Encounter: Payer: Self-pay | Admitting: Family Medicine

## 2017-06-09 VITALS — BP 122/62 | HR 78 | Temp 97.8°F | Wt 130.0 lb

## 2017-06-09 DIAGNOSIS — E559 Vitamin D deficiency, unspecified: Secondary | ICD-10-CM | POA: Diagnosis not present

## 2017-06-09 DIAGNOSIS — J069 Acute upper respiratory infection, unspecified: Secondary | ICD-10-CM

## 2017-06-09 DIAGNOSIS — M810 Age-related osteoporosis without current pathological fracture: Secondary | ICD-10-CM

## 2017-06-09 LAB — CALCIUM: CALCIUM: 9.6 mg/dL (ref 8.4–10.5)

## 2017-06-09 NOTE — Assessment & Plan Note (Signed)
Discussed with patient and [name] - pending approval from insurance. She needs injection prior to 06/29/2017 to be covered by her grant. Check Ca today in anticipation of upcoming prolia.

## 2017-06-09 NOTE — Telephone Encounter (Signed)
Its not back as of yet. I inquired about it on 11/9 and it is still in process by her insurance co

## 2017-06-09 NOTE — Assessment & Plan Note (Signed)
URI with cough - anticipate viral given short duration. Not consistent with COPD exacerbation. Supportive care reviewed. rec add nasal saline.

## 2017-06-09 NOTE — Telephone Encounter (Signed)
Where are we on prolia? Patient in office today.

## 2017-06-09 NOTE — Assessment & Plan Note (Signed)
Continue vitamin D replacement.

## 2017-06-09 NOTE — Patient Instructions (Addendum)
Labs today to check calcium  I do think you have a viral respiratory infection - make sure you stay well hydrated and continue flonase and duonebs. May try nasal saline as well.  Return to see me as needed or in 6 months for medicare wellness visit and physical.

## 2017-06-09 NOTE — Progress Notes (Signed)
BP 122/62 (BP Location: Left Arm, Patient Position: Sitting, Cuff Size: Normal)   Pulse 78   Temp 97.8 F (36.6 C) (Oral)   Wt 130 lb (59 kg)   SpO2 95%   BMI 20.98 kg/m    CC: 6 mo f/u visit Subjective:    Patient ID: [name], female    DOB: Jan 25, 1941, 76 y.o.   MRN: [id number]  HPI: [name] is a 76 y.o. female presenting on 06/09/2017 for 6 mo follow-up   Here with niece [name] today  (mother of Franki Monte)   1d h/o "cold" progressively worsening dyspnea associated with dry cough and head congestion without sinus pain. No fevers/chills, ST, or body aches. She has received flu shot year.   Pulm nodules followed by pulmonology, planned f/u CT 06/2017.  Osteoporosis - on prolia last injection 11/2016. Pending insurance approval prior to next injection.   She took 2 nitro a few nights ago for left hand numbness and back pain. No chest pain at that time.   Relevant past medical, surgical, family and social history reviewed and updated as indicated. Interim medical history since our last visit reviewed. Allergies and medications reviewed and updated. Outpatient Medications Prior to Visit  Medication Sig Dispense Refill  . albuterol (PROAIR HFA) 108 (90 Base) MCG/ACT inhaler Inhale 2 puffs into the lungs every 4 (four) hours as needed for wheezing or shortness of breath. 1 Inhaler 1  . albuterol (PROVENTIL) (2.5 MG/3ML) 0.083% nebulizer solution Take 3 mLs (2.5 mg total) by nebulization every 4 (four) hours as needed for wheezing or shortness of breath. 150 mL 5  . aspirin EC 81 MG tablet Take 1 tablet (81 mg total) by mouth daily.    . Calcium Carbonate-Vitamin D (CALCIUM-VITAMIN D) 500-200 MG-UNIT per tablet Take 1 tablet by mouth 2 (two) times daily.     . Cholecalciferol (VITAMIN D) 2000 UNITS CAPS Take 1 capsule by mouth daily.    . clopidogrel (PLAVIX) 75 MG tablet Take 1 tablet (75 mg total) by mouth daily. 30 tablet 11  . denosumab (PROLIA) 60 MG/ML SOLN  injection Inject 60 mg into the skin every 6 (six) months. Administer in upper arm, thigh, or abdomen 60 mL 1  . fluticasone (FLONASE) 50 MCG/ACT nasal spray INSTILL TWO SPRAYS INTO BOTH NOSTRILS DAILY. 16 g 5  . furosemide (LASIX) 40 MG tablet Take 1 tablet (40 mg total) by mouth daily. Take an additional 1/2 tablet daily for swelling. 40 tablet 11  . ipratropium-albuterol (DUONEB) 0.5-2.5 (3) MG/3ML SOLN Take 3 mLs by nebulization every 4 (four) hours as needed (wheezing or shortness of breath).    . isosorbide mononitrate (IMDUR) 30 MG 24 hr tablet TAKE 1 TABLET BY MOUTH DAILY 30 tablet 10  . loratadine (CLARITIN) 10 MG tablet Take 10 mg by mouth daily.    Marland Kitchen LOTEMAX 0.5 % ophthalmic suspension Place 1 drop into both eyes daily.     . nitroGLYCERIN (NITROSTAT) 0.4 MG SL tablet Place 1 tablet (0.4 mg total) under the tongue every 5 (five) minutes as needed for chest pain. 25 tablet 3  . pantoprazole (PROTONIX) 40 MG tablet Take 1 tablet (40 mg total) by mouth daily. 30 tablet 7  . simvastatin (ZOCOR) 40 MG tablet Take 1 tablet (40 mg total) by mouth at bedtime. 30 tablet 6  . Spacer/Aero-Holding Chambers (AEROCHAMBER MV) inhaler Use as instructed 1 each 0  . spironolactone (ALDACTONE) 25 MG tablet TAKE 1/2 TABLET BY MOUTH  EVERY DAY. 45 tablet 3   No facility-administered medications prior to visit.      Per HPI unless specifically indicated in ROS section below Review of Systems     Objective:    BP 122/62 (BP Location: Left Arm, Patient Position: Sitting, Cuff Size: Normal)   Pulse 78   Temp 97.8 F (36.6 C) (Oral)   Wt 130 lb (59 kg)   SpO2 95%   BMI 20.98 kg/m   Wt Readings from Last 3 Encounters:  06/09/17 130 lb (59 kg)  05/17/17 129 lb 9.6 oz (58.8 kg)  05/03/17 129 lb (58.5 kg)    Physical Exam  Constitutional: She appears well-developed and well-nourished. No distress.  HENT:  Head: Normocephalic and atraumatic.  Right Ear: Hearing, tympanic membrane, external ear and  ear canal normal.  Left Ear: Hearing, tympanic membrane, external ear and ear canal normal.  Nose: No mucosal edema or rhinorrhea. Right sinus exhibits no maxillary sinus tenderness and no frontal sinus tenderness. Left sinus exhibits no maxillary sinus tenderness and no frontal sinus tenderness.  Mouth/Throat: Uvula is midline, oropharynx is clear and moist and mucous membranes are normal. No oropharyngeal exudate, posterior oropharyngeal edema, posterior oropharyngeal erythema or tonsillar abscesses.  Eyes: Conjunctivae and EOM are normal. Pupils are equal, round, and reactive to light. No scleral icterus.  Neck: Normal range of motion. Neck supple.  Cardiovascular: Normal rate, regular rhythm, normal heart sounds and intact distal pulses.  No murmur heard. Pulmonary/Chest: Effort normal and breath sounds normal. No respiratory distress. She has no wheezes. She has no rales.  Shallow breathing but lungs overall clear  Lymphadenopathy:    She has no cervical adenopathy.  Skin: Skin is warm and dry. No rash noted.  Nursing note and vitals reviewed.  Results for orders placed or performed in visit on 04/28/17  Fecal Occult Blood, Guaiac  Result Value Ref Range   Fecal Occult Blood Negative       Assessment & Plan:   Problem List Items Addressed This Visit    Osteoporosis - Primary    Discussed with patient and [name] - pending approval from insurance. She needs injection prior to 06/29/2017 to be covered by her grant. Check Ca today in anticipation of upcoming prolia.       Relevant Orders   Calcium   URI (upper respiratory infection)    URI with cough - anticipate viral given short duration. Not consistent with COPD exacerbation. Supportive care reviewed. rec add nasal saline.       Vitamin D insufficiency    Continue vitamin D replacement.           Follow up plan: Return in about 6 months (around 12/07/2017) for annual exam, prior fasting for blood work, medicare wellness  visit.  Ria Bush, MD

## 2017-06-10 ENCOUNTER — Encounter: Payer: Self-pay | Admitting: Emergency Medicine

## 2017-06-10 ENCOUNTER — Ambulatory Visit: Payer: PPO | Admitting: Emergency Medicine

## 2017-06-10 DIAGNOSIS — R918 Other nonspecific abnormal finding of lung field: Secondary | ICD-10-CM | POA: Diagnosis not present

## 2017-06-10 DIAGNOSIS — J301 Allergic rhinitis due to pollen: Secondary | ICD-10-CM

## 2017-06-10 DIAGNOSIS — C3431 Malignant neoplasm of lower lobe, right bronchus or lung: Secondary | ICD-10-CM

## 2017-06-10 DIAGNOSIS — J431 Panlobular emphysema: Secondary | ICD-10-CM | POA: Diagnosis not present

## 2017-06-10 NOTE — Assessment & Plan Note (Signed)
Appears to be better compensated now on DuoNeb 4 times a day.  Minimal albuterol use.  No evidence of an acute exacerbation at this time but she does have some nasal congestion, possible URI symptoms.  She is at risk for flaring.  I explained to her that she needs to call us if she develops symptoms consistent with a COPD flare.  Flu Shot and Prevnar, and Pneumovax are up-to-date

## 2017-06-10 NOTE — Assessment & Plan Note (Signed)
No clinical evidence of recurrence.  We are following pulmonary nodules by CT scan as mentioned above.

## 2017-06-10 NOTE — Assessment & Plan Note (Signed)
Most recent CT scan was in preparation for needle biopsy.  Her nodule was smaller so this was deferred.  Follow-up scan is planned for early December.  We will call her with the results.

## 2017-06-10 NOTE — Progress Notes (Signed)
Subjective:    Patient ID: [name], female    DOB: [birth year], 76 y.o.   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 Also history of COPD. We have been following pulmonary nodules. On her most recent CT scan 11/30/16 there was an enlargement in her left lower lobe posterior nodule. This prompted [place] to perform a PET scan that was done on 12/22/16 that I have personally reviewed. This shows a slight increase in moderate hypermetabolism in both the left lower lobe and right lower lobe nodules corresponding with a very slow increase in size since 2016. Some mild hypermetabolism noted in the mediastinal lymph nodes as well as a right axillary node that is hypermetabolic, unclear significance.                        ROV 03/24/17 -- follow-up visit for patient with a history of COPD, right lower lobe superior segmental adenocarcinoma status post resection. We have been following pulmonary nodules, had planned for a needle biopsy 01/05/17 but at this time the nodular lesion was smaller and less prominent. She has been dealing with URI sx, a flare that was treated with pred + azithro. She is taking albuterol / ipratropium nebs tid.                 She also tells me that she has observed symptomatic bradycardia - in the am when she wakes, sometimes HR is in 30's. This correlates with times that she feels bad - fatigued, weak, dyspneic. She does have a history of coronary disease,  no rhythm disturbance to my knowledge.  She is not on any rate control medications.          ROV 05/03/17 -- Patient has a history of COPD, right lower lobe superior segmental resection for adenocarcinoma, pulmonary nodule that we have followed with serial CT scans of the chest, smaller on her most recent attempt at needle biopsy.  She has had more dyspnea for the last week, some cough that was productive, now less so. She has been  tachypneic. She is on DuoNeb 3x a day.   Chest x-ray today hyperinflated with prominent vascular markings, large pulmonary artery versus lobe infiltrate  ROV 06/10/17 --76 year old woman with a history of COPD and a right lower lobe superior segmental segmentectomy for adenocarcinoma of the lung.  She has pulmonary nodules that we have followed with serial CT scans, most recent showed an index nodule that had decreased in size so biopsy was deferred.  Her bronchodilators currently include DuoNeb 4x a day. She has had some nasal congestion, hoarse voice last 2-3 days. She has albuterol HFA, uses rarely.  She is on loratadine prn, uses flonase daily. Flu shot is up to date.   Review of Systems     As per history of present illness    Objective:   Physical Exam Vitals:   06/10/17 1045  BP: 124/78  Pulse: 75  SpO2: 94%  Weight: 132 lb (59.9 kg)  Height: 5\' 6"  (1.676 m)    Gen: Anxious, elderly woman, in mild resp distress  ENT: No lesions,  mouth clear,  oropharynx clear, no postnasal drip, weak hoarse voice  Neck: No JVD, no stridor  Lungs: tachypneic, small breaths but no wheezing. She is able to force an expiration  Cardiovascular: RRR, heart sounds normal, no murmur  or gallops, no peripheral edema  Musculoskeletal: No deformities, no cyanosis or clubbing  Neuro: alert, non focal  Skin: Warm, no lesions or rashes   CT chest 11/30/16 --  COMPARISON:  Chest CT 06/22/2016.  FINDINGS: Cardiovascular: Heart size is normal. There is no significant pericardial fluid, thickening or pericardial calcification. There is aortic atherosclerosis, as well as atherosclerosis of the great vessels of the mediastinum and the coronary arteries, including calcified atherosclerotic plaque in the left main, left anterior descending, left circumflex and right coronary arteries. Left circumflex coronary artery stent.  Mediastinum/Nodes: No pathologically enlarged mediastinal or hilar lymph  nodes. Please note that accurate exclusion of hilar adenopathy is limited on noncontrast CT scans. Esophagus is unremarkable in appearance. No axillary lymphadenopathy.  Lungs/Pleura: Several nodular appearing areas of architectural distortion are again noted in the lungs bilaterally. The largest and most concerning of these is in the superior segment of the left lower lobe (axial image 62 of series 3) which currently measures 1.6 x 2.2 cm (previously 1.5 cm on 06/22/2016), and in the posterior aspect of the right lower lobe (image 110 of series 3) measuring 2.5 x 1.0 cm (previously 1.9 cm). Previously noted nodular areas of architectural distortion in the right middle lobe have resolved compared to the prior study, indicative of a benign infectious or inflammatory etiology on the prior study. Nodular areas of pleural-parenchymal thickening are again noted in the apices of the lungs bilaterally, most compatible with chronic post infectious or inflammatory scarring. Suture line in the right lower lobe, compatible with prior wedge resection. No acute consolidative airspace disease. No pleural effusions. Mild diffuse bronchial wall thickening with moderate centrilobular and paraseptal emphysema.  Upper Abdomen: Aortic atherosclerosis.  Musculoskeletal: There are no aggressive appearing lytic or blastic lesions noted in the visualized portions of the skeleton.  IMPRESSION: 1. Resolution of previously noted right middle lobe pulmonary nodules, indicative of a benign infectious or inflammatory etiology on the prior study. 2. Slow progressive enlargement of 2 nodules in the superior segment of the left lower lobe and in the periphery of the right lower lobe when compared to numerous prior examinations. These findings remain concerning for potential neoplasm. Further evaluation with PET-CT is suggested in the near future. 3. Diffuse bronchial wall thickening with moderate centrilobular  and paraseptal emphysema; imaging findings compatible with the reported clinical history of COPD. 4. Aortic atherosclerosis, in addition to left main and 3 vessel coronary artery disease. Assessment for potential risk factor modification, dietary therapy or pharmacologic therapy may be warranted, if clinically indicated. 5. Additional incidental findings, as above      Assessment & Plan:  Pulmonary nodules Most recent CT scan was in preparation for needle biopsy.  Her nodule was smaller so this was deferred.  Follow-up scan is planned for early December.  We will call her with the results.  Cancer of lower lobe of right lung ([place]) No clinical evidence of recurrence.  We are following pulmonary nodules by CT scan as mentioned above.  COPD (chronic obstructive pulmonary disease) (H[facility]) Appears to be better compensated now on DuoNeb 4 times a day.  Minimal albuterol use.  No evidence of an acute exacerbation at this time but she does have some nasal congestion, possible URI symptoms.  She is at risk for flaring.  I explained to her that she needs to call us if she develops symptoms consistent with a COPD flare.  Flu Shot and Prevnar, and Pneumovax are up-to-date  Allergic rhinitis Continue fluticasone nasal  spray.  Restart loratadine during the spring allergy season  Baltazar Apo, MD, PhD 06/10/2017, 11:16 AM [id number] [phone] or if no answer [phone]

## 2017-06-10 NOTE — Patient Instructions (Signed)
Please continue to use DuoNeb 4 times a day on a schedule. Use albuterol 2 puffs if needed for shortness of breath. Continue Flonase nasal spray every day Restart your loratadine (Claritin) in the spring.  You will need to use this medicine during the spring and fall months. Flu shot and pneumonia shots are up-to-date. Get your CT scan in December as planned.  We will call you with the results. Follow with Dr Lamonte Sakai in 4 months or sooner if you have any problems.

## 2017-06-10 NOTE — Assessment & Plan Note (Signed)
Continue fluticasone nasal spray.  Restart loratadine during the spring allergy season

## 2017-06-21 ENCOUNTER — Other Ambulatory Visit: Payer: Self-pay | Admitting: Family Medicine

## 2017-06-21 ENCOUNTER — Telehealth: Payer: Self-pay

## 2017-06-21 DIAGNOSIS — Z1231 Encounter for screening mammogram for malignant neoplasm of breast: Secondary | ICD-10-CM

## 2017-06-21 NOTE — Telephone Encounter (Signed)
Copied from [place] [phone]. Topic: Appointment Scheduling - Prior Auth Required for Appointment >> Jun 21, 2017  1:24 PM [name] wrote: No appointment has been scheduled. Patient is requesting prolia appointment. Per scheduling protocol, this appointment requires a prior authorization prior to scheduling. Pt states she needs it by 06/29/17. Cb number is [phone]   Route to department's PEC pool.

## 2017-06-22 NOTE — Telephone Encounter (Signed)
Verification of benefits have been processed and an approval has been received for pts prolia injection. Pts estimated cost are appx $210. This is only an estimate and cannot be confirmed until benefits are paid. Please advise pt and schedule if needed. If scheduled, once the injection is received, pls contact me back with the date it was received so that I am able to update prolia folder. thanks

## 2017-06-22 NOTE — Telephone Encounter (Signed)
Spoke to pt and scheduled prolia injection.

## 2017-06-22 NOTE — Telephone Encounter (Signed)
Spoke to pt and advised. Prolia injection scheduled.

## 2017-06-29 ENCOUNTER — Ambulatory Visit (INDEPENDENT_AMBULATORY_CARE_PROVIDER_SITE_OTHER): Payer: PPO | Admitting: *Deleted

## 2017-06-29 ENCOUNTER — Encounter: Payer: Self-pay | Admitting: *Deleted

## 2017-06-29 DIAGNOSIS — M810 Age-related osteoporosis without current pathological fracture: Secondary | ICD-10-CM | POA: Diagnosis not present

## 2017-06-29 MED ORDER — DENOSUMAB 60 MG/ML ~~LOC~~ SOLN
60.0000 mg | Freq: Once | SUBCUTANEOUS | Status: AC
  Administered 2017-06-29: 60 mg via SUBCUTANEOUS

## 2017-07-02 ENCOUNTER — Ambulatory Visit: Payer: PPO

## 2017-07-02 ENCOUNTER — Ambulatory Visit
Admission: RE | Admit: 2017-07-02 | Discharge: 2017-07-02 | Disposition: A | Discharge status: Home / Self Care | Payer: PPO | Source: Ambulatory Visit | Attending: Emergency Medicine | Admitting: Emergency Medicine

## 2017-07-02 DIAGNOSIS — R918 Other nonspecific abnormal finding of lung field: Secondary | ICD-10-CM | POA: Diagnosis not present

## 2017-07-02 DIAGNOSIS — J439 Emphysema, unspecified: Secondary | ICD-10-CM | POA: Diagnosis not present

## 2017-07-02 DIAGNOSIS — I2510 Atherosclerotic heart disease of native coronary artery without angina pectoris: Secondary | ICD-10-CM | POA: Diagnosis not present

## 2017-07-02 DIAGNOSIS — I700 Atherosclerosis of aorta: Secondary | ICD-10-CM | POA: Diagnosis not present

## 2017-07-06 ENCOUNTER — Encounter (HOSPITAL_COMMUNITY): Payer: Self-pay | Admitting: Emergency Medicine

## 2017-07-06 ENCOUNTER — Inpatient Hospital Stay (HOSPITAL_COMMUNITY)
Admission: EM | Admit: 2017-07-06 | Discharge: 2017-07-09 | DRG: 250 | Disposition: A | Discharge status: Home / Self Care | Payer: PPO | Attending: Cardiovascular Disease | Admitting: Cardiovascular Disease

## 2017-07-06 ENCOUNTER — Encounter (HOSPITAL_COMMUNITY)
Admission: EM | Disposition: A | Discharge status: Home / Self Care | Payer: Self-pay | Source: Home / Self Care | Attending: Cardiovascular Disease

## 2017-07-06 DIAGNOSIS — Z7982 Long term (current) use of aspirin: Secondary | ICD-10-CM

## 2017-07-06 DIAGNOSIS — I959 Hypotension, unspecified: Secondary | ICD-10-CM | POA: Diagnosis not present

## 2017-07-06 DIAGNOSIS — I2510 Atherosclerotic heart disease of native coronary artery without angina pectoris: Secondary | ICD-10-CM | POA: Diagnosis not present

## 2017-07-06 DIAGNOSIS — I5043 Acute on chronic combined systolic (congestive) and diastolic (congestive) heart failure: Secondary | ICD-10-CM | POA: Diagnosis present

## 2017-07-06 DIAGNOSIS — R079 Chest pain, unspecified: Secondary | ICD-10-CM | POA: Diagnosis not present

## 2017-07-06 DIAGNOSIS — Z85118 Personal history of other malignant neoplasm of bronchus and lung: Secondary | ICD-10-CM | POA: Diagnosis not present

## 2017-07-06 DIAGNOSIS — E785 Hyperlipidemia, unspecified: Secondary | ICD-10-CM | POA: Diagnosis not present

## 2017-07-06 DIAGNOSIS — Z823 Family history of stroke: Secondary | ICD-10-CM

## 2017-07-06 DIAGNOSIS — K219 Gastro-esophageal reflux disease without esophagitis: Secondary | ICD-10-CM | POA: Diagnosis not present

## 2017-07-06 DIAGNOSIS — I5042 Chronic combined systolic (congestive) and diastolic (congestive) heart failure: Secondary | ICD-10-CM

## 2017-07-06 DIAGNOSIS — I2111 ST elevation (STEMI) myocardial infarction involving right coronary artery: Secondary | ICD-10-CM | POA: Diagnosis not present

## 2017-07-06 DIAGNOSIS — J449 Chronic obstructive pulmonary disease, unspecified: Secondary | ICD-10-CM | POA: Diagnosis present

## 2017-07-06 DIAGNOSIS — J431 Panlobular emphysema: Secondary | ICD-10-CM

## 2017-07-06 DIAGNOSIS — I493 Ventricular premature depolarization: Secondary | ICD-10-CM | POA: Diagnosis not present

## 2017-07-06 DIAGNOSIS — I2119 ST elevation (STEMI) myocardial infarction involving other coronary artery of inferior wall: Principal | ICD-10-CM | POA: Diagnosis present

## 2017-07-06 DIAGNOSIS — H3530 Unspecified macular degeneration: Secondary | ICD-10-CM | POA: Diagnosis present

## 2017-07-06 DIAGNOSIS — I214 Non-ST elevation (NSTEMI) myocardial infarction: Secondary | ICD-10-CM | POA: Diagnosis not present

## 2017-07-06 DIAGNOSIS — Z7951 Long term (current) use of inhaled steroids: Secondary | ICD-10-CM

## 2017-07-06 DIAGNOSIS — Z833 Family history of diabetes mellitus: Secondary | ICD-10-CM

## 2017-07-06 DIAGNOSIS — Z79899 Other long term (current) drug therapy: Secondary | ICD-10-CM | POA: Diagnosis not present

## 2017-07-06 DIAGNOSIS — R0989 Other specified symptoms and signs involving the circulatory and respiratory systems: Secondary | ICD-10-CM | POA: Diagnosis not present

## 2017-07-06 DIAGNOSIS — I255 Ischemic cardiomyopathy: Secondary | ICD-10-CM | POA: Diagnosis present

## 2017-07-06 DIAGNOSIS — I213 ST elevation (STEMI) myocardial infarction of unspecified site: Secondary | ICD-10-CM

## 2017-07-06 DIAGNOSIS — I361 Nonrheumatic tricuspid (valve) insufficiency: Secondary | ICD-10-CM | POA: Diagnosis not present

## 2017-07-06 DIAGNOSIS — Z8249 Family history of ischemic heart disease and other diseases of the circulatory system: Secondary | ICD-10-CM

## 2017-07-06 DIAGNOSIS — Z7902 Long term (current) use of antithrombotics/antiplatelets: Secondary | ICD-10-CM

## 2017-07-06 DIAGNOSIS — M810 Age-related osteoporosis without current pathological fracture: Secondary | ICD-10-CM | POA: Diagnosis present

## 2017-07-06 DIAGNOSIS — I252 Old myocardial infarction: Secondary | ICD-10-CM | POA: Diagnosis not present

## 2017-07-06 DIAGNOSIS — Z955 Presence of coronary angioplasty implant and graft: Secondary | ICD-10-CM

## 2017-07-06 DIAGNOSIS — I130 Hypertensive heart and chronic kidney disease with heart failure and stage 1 through stage 4 chronic kidney disease, or unspecified chronic kidney disease: Secondary | ICD-10-CM | POA: Diagnosis present

## 2017-07-06 DIAGNOSIS — Z8673 Personal history of transient ischemic attack (TIA), and cerebral infarction without residual deficits: Secondary | ICD-10-CM | POA: Diagnosis not present

## 2017-07-06 DIAGNOSIS — Z87891 Personal history of nicotine dependence: Secondary | ICD-10-CM | POA: Diagnosis not present

## 2017-07-06 DIAGNOSIS — I219 Acute myocardial infarction, unspecified: Secondary | ICD-10-CM | POA: Diagnosis present

## 2017-07-06 DIAGNOSIS — N183 Chronic kidney disease, stage 3 (moderate): Secondary | ICD-10-CM | POA: Diagnosis present

## 2017-07-06 HISTORY — PX: LEFT HEART CATH AND CORONARY ANGIOGRAPHY: CATH118249

## 2017-07-06 HISTORY — PX: CORONARY STENT INTERVENTION: CATH118234

## 2017-07-06 HISTORY — PX: CORONARY/GRAFT ACUTE MI REVASCULARIZATION: CATH118305

## 2017-07-06 LAB — CBC WITH DIFFERENTIAL/PLATELET
BASOS ABS: 0.1 10*3/uL (ref 0.0–0.1)
BASOS PCT: 1 %
EOS ABS: 1.1 10*3/uL — AB (ref 0.0–0.7)
Eosinophils Relative: 12 %
HCT: 41.8 % (ref 36.0–46.0)
HEMOGLOBIN: 14.5 g/dL (ref 12.0–15.0)
Lymphocytes Relative: 25 %
Lymphs Abs: 2.4 10*3/uL (ref 0.7–4.0)
MCH: 32.4 pg (ref 26.0–34.0)
MCHC: 34.7 g/dL (ref 30.0–36.0)
MCV: 93.5 fL (ref 78.0–100.0)
Monocytes Absolute: 0.5 10*3/uL (ref 0.1–1.0)
Monocytes Relative: 5 %
NEUTROS PCT: 57 %
Neutro Abs: 5.6 10*3/uL (ref 1.7–7.7)
Platelets: 287 10*3/uL (ref 150–400)
RBC: 4.47 MIL/uL (ref 3.87–5.11)
RDW: 13.5 % (ref 11.5–15.5)
WBC: 9.7 10*3/uL (ref 4.0–10.5)

## 2017-07-06 LAB — POCT I-STAT, CHEM 8
BUN: 22 mg/dL — AB (ref 6–20)
CHLORIDE: 103 mmol/L (ref 101–111)
CREATININE: 0.9 mg/dL (ref 0.44–1.00)
Calcium, Ion: 1.15 mmol/L (ref 1.15–1.40)
GLUCOSE: 191 mg/dL — AB (ref 65–99)
HCT: 43 % (ref 36.0–46.0)
Hemoglobin: 14.6 g/dL (ref 12.0–15.0)
POTASSIUM: 4.3 mmol/L (ref 3.5–5.1)
Sodium: 137 mmol/L (ref 135–145)
TCO2: 23 mmol/L (ref 22–32)

## 2017-07-06 LAB — POCT I-STAT TROPONIN I: Troponin i, poc: 0.08 ng/mL (ref 0.00–0.08)

## 2017-07-06 LAB — PROTIME-INR
INR: 0.98
PROTHROMBIN TIME: 12.9 s (ref 11.4–15.2)

## 2017-07-06 SURGERY — CORONARY/GRAFT ACUTE MI REVASCULARIZATION
Anesthesia: LOCAL

## 2017-07-06 MED ORDER — ONDANSETRON HCL 4 MG/2ML IJ SOLN
INTRAMUSCULAR | Status: AC
  Filled 2017-07-06: qty 2

## 2017-07-06 MED ORDER — HEPARIN (PORCINE) IN NACL 2-0.9 UNIT/ML-% IJ SOLN
INTRAMUSCULAR | Status: AC
  Filled 2017-07-06: qty 1000

## 2017-07-06 MED ORDER — IOPAMIDOL (ISOVUE-370) INJECTION 76%
INTRAVENOUS | Status: AC
  Filled 2017-07-06: qty 125

## 2017-07-06 MED ORDER — LIDOCAINE HCL (PF) 1 % IJ SOLN
INTRAMUSCULAR | Status: DC | PRN
  Administered 2017-07-06: 15 mL

## 2017-07-06 MED ORDER — LIDOCAINE HCL (PF) 1 % IJ SOLN
INTRAMUSCULAR | Status: AC
  Filled 2017-07-06: qty 30

## 2017-07-06 MED ORDER — DIPHENHYDRAMINE HCL 50 MG/ML IJ SOLN
25.0000 mg | Freq: Once | INTRAMUSCULAR | Status: DC
  Filled 2017-07-06: qty 1

## 2017-07-06 MED ORDER — HEPARIN BOLUS VIA INFUSION
4000.0000 [IU] | Freq: Once | INTRAVENOUS | Status: AC
Start: 2017-07-06 — End: 2017-07-06
  Administered 2017-07-06: 4000 [IU] via INTRAVENOUS

## 2017-07-06 MED ORDER — HEPARIN SOD (PORK) LOCK FLUSH 100 UNIT/ML IV SOLN: 4000.0000 [IU] | Freq: Once | INTRAVENOUS | Status: DC

## 2017-07-06 MED ORDER — METOCLOPRAMIDE HCL 5 MG/ML IJ SOLN
10.0000 mg | Freq: Once | INTRAMUSCULAR | Status: DC
  Filled 2017-07-06: qty 2

## 2017-07-06 MED ORDER — ONDANSETRON HCL 4 MG/2ML IJ SOLN
INTRAMUSCULAR | Status: DC | PRN
  Administered 2017-07-06: 4 mg via INTRAVENOUS

## 2017-07-06 SURGICAL SUPPLY — 12 items
BALLN SAPPHIRE 2.0X12 (BALLOONS) ×2
BALLOON SAPPHIRE 2.0X12 (BALLOONS) IMPLANT
CATH INFINITI 5FR MULTPACK ANG (CATHETERS) ×1 IMPLANT
CATH VISTA GUIDE 6FR JR4 (CATHETERS) ×1 IMPLANT
KIT ENCORE 26 ADVANTAGE (KITS) ×3 IMPLANT
KIT HEART LEFT (KITS) ×2 IMPLANT
PACK CARDIAC CATHETERIZATION (CUSTOM PROCEDURE TRAY) ×2 IMPLANT
SHEATH PINNACLE 6F 10CM (SHEATH) ×1 IMPLANT
TRANSDUCER W/STOPCOCK (MISCELLANEOUS) ×2 IMPLANT
TUBING CIL FLEX 10 FLL-RA (TUBING) ×2 IMPLANT
WIRE ASAHI PROWATER 180CM (WIRE) ×1 IMPLANT
WIRE EMERALD 3MM-J .035X150CM (WIRE) ×1 IMPLANT

## 2017-07-06 NOTE — ED Notes (Signed)
Transported to Cath Lab .

## 2017-07-06 NOTE — ED Triage Notes (Signed)
Patient arrived with EMS from home central chest pain radiating to left arm onset this evening , received ASA 314 mg po , 4 NTG sl , Zofran 4 mg IV and Morphine 4 mg IV . Rates pain 7/10 at arrival .

## 2017-07-06 NOTE — H&P (Signed)
Cardiology Admission History and Physical:   Patient ID: [name]; MRN: [id number]; DOB: 1941/01/04   Admission date: 07/06/2017  Primary Care Provider: Ria Bush, MD Primary Cardiologist: No primary care provider on file.  Dr Talbot Grumbling Primary Electrophysiologist:    Chief Complaint:  CHEST PAIN, NAUSEA  Patient Profile:   [name] is a 76 y.o. female with a history of  Ischemic cardiomyopathy,   with past medical history of CAD, chronic systolic heart failure/ICM , CKD stage III, Small cell lung CA s/p resection, COPD/emphysema, hypertension, hyperlipidemia, CVA and GERD presents for evaluation of low heart rate and dizziness.  H/o  acute MI in 2010 with LCx occlusion tx with primary PCI with BMS.   She also has a hx of R MCA strokein 2013.   She presented in 7/17 with an anterior STEMI and was found to have an apical occlusion of the LAD. This was treated medically. Unable to tolerate BB or ACEi with her lung disease.The patient was readmitted in December 2017 with acute coronary syndrome and ruled in for non-STEMI. She underwent cardiac catheterization demonstrating a ruptured plaque in the LAD and she was treated with PCI without complication. LVEF is severely reduced. Her LVEDP was normal. Medications were adjusted.  She was doing well on cardiac stand point when last seen by Dr. Burt Knack 10/2016. Recently treated with prednisone and antibiotic for COPD exacerbation 02/2017.   Recently noted low HR to 37-49 with normal BP in morning with dizziness. Patient has noted intermittent dizziness and no pulse for the past 3-4 weeks. She is also fatigued and tired. She is unable to tell that of she is feeling poor when her pulse is slow. Intermittent lower extremity edema. Shortness of breath has been stable. No chest pain, palpitation, orthopnea, PND, syncope or melena. Low sodium diet.   History of Present Illness:   [name]  Started having chest pain   Today Patient arrived with EMS from home central chest pain radiating to left arm onset this evening , received ASA 314 mg po , 4 NTG sl , Zofran 4 mg IV and Morphine 4 mg IV . Rates pain 7/10 at arrival  Chest pain at rest, 8/10, radiating to left arm , squeezing  In  Nature , Slight  Difficulty in breathing  Upon lying  Down.  , NYHA  Class II/III   Past Medical History:  Diagnosis Date  . Arthritis   . CAD (coronary artery disease) 03/2009   s/p MI  . Chronic combined systolic and diastolic heart failure ([place]) 06/17/2009   Qualifier: Diagnosis of  By: Burt Knack, MD, [place] Pert   . CKD (chronic kidney disease) stage 3, GFR 30-59 ml/min (HCC)   . Emphysema/COPD ([place])   . Ex-smoker quit 2008  . GERD (gastroesophageal reflux disease)   . HCAP (healthcare-associated pneumonia) 03/19/2016  . Helicobacter pylori gastritis 10/2007   treated  . History of CVA (cerebrovascular accident) without residual deficits 01/2012, 06/2012   R hemorrhagic MCA 01/2012 with remote lacunar infarct L putamen and IC, rpt 06/2012 acute multifocal R MCA infarct with remote hemorrhagic strokes affecting L basal ganglia and periventricular white matter, full recovery  . HLD (hyperlipidemia)   . HTN (hypertension)   . Ischemic cardiomyopathy 02/27/2014  . Lung cancer ([place]) dx'd 07/2006   Lung CA, s/p resection, followed by Dr Earlie Server  . Macular degeneration   . Myocardial infarction [facility]) 03/2009   Acute myocardial infarction 2010 - treated with BMS of LCx. LVEF  50% with subsequent CHF  . Osteoporosis 09/2013   T -3.6 forearm 09/2013, T -4.5 forearm 04/2015  . Stroke [facility])     Past Surgical History:  Procedure Laterality Date  . BREAST BIOPSY Left   . CARDIAC CATHETERIZATION N/A 02/24/2016   Procedure: Left Heart Cath and Coronary Angiography;  Surgeon: Belva Crome, MD;  Location: [place] CV LAB;  Service: Cardiovascular;  Laterality: N/A;  . CARDIAC CATHETERIZATION N/A 07/09/2016   Procedure: Left Heart  Cath and Coronary Angiography;  Surgeon: Peter M Martinique, MD;  Location: [place] CV LAB;  Service: Cardiovascular;  Laterality: N/A;  . CARDIAC CATHETERIZATION N/A 07/09/2016   Procedure: Coronary Stent Intervention;  Surgeon: Peter M Martinique, MD;  Location: [place] CV LAB;  Service: Cardiovascular;  Laterality: N/A;  . CATARACT EXTRACTION Bilateral   . CORONARY STENT PLACEMENT  06/2016   DES to mid LAD  . EYE SURGERY    . LUNG REMOVAL, PARTIAL Right 2008  . PARTIAL HYSTERECTOMY  1986   irregular periods, ovaries remain     Medications Prior to Admission: Prior to Admission medications   Medication Sig Start Date End Date Taking? Authorizing Provider  albuterol (PROAIR HFA) 108 (90 Base) MCG/ACT inhaler Inhale 2 puffs into the lungs every 4 (four) hours as needed for wheezing or shortness of breath. 03/24/16   de Dios, [place] A, MD  albuterol (PROVENTIL) (2.5 MG/3ML) 0.083% nebulizer solution Take 3 mLs (2.5 mg total) by nebulization every 4 (four) hours as needed for wheezing or shortness of breath. 07/31/16   Collene Gobble, MD  aspirin EC 81 MG tablet Take 1 tablet (81 mg total) by mouth daily. 02/26/16   Arbutus Leas, NP  Calcium Carbonate-Vitamin D (CALCIUM-VITAMIN D) 500-200 MG-UNIT per tablet Take 1 tablet by mouth 2 (two) times daily.     [provider]  Cholecalciferol (VITAMIN D) 2000 UNITS CAPS Take 1 capsule by mouth daily.    [provider]  clopidogrel (PLAVIX) 75 MG tablet Take 1 tablet (75 mg total) by mouth daily. 04/01/17   Sherren Mocha, MD  denosumab (PROLIA) 60 MG/ML SOLN injection Inject 60 mg into the skin every 6 (six) months. Administer in upper arm, thigh, or abdomen 06/13/15   Ria Bush, MD  fluticasone [facility]) 50 MCG/ACT nasal spray INSTILL TWO SPRAYS INTO BOTH NOSTRILS DAILY. 04/13/17   Collene Gobble, MD  furosemide (LASIX) 40 MG tablet Take 1 tablet (40 mg total) by mouth daily. Take an additional 1/2 tablet daily for  swelling. 04/15/17 04/10/18  Sherren Mocha, MD  ipratropium-albuterol (DUONEB) 0.5-2.5 (3) MG/3ML SOLN Take 3 mLs by nebulization every 4 (four) hours as needed (wheezing or shortness of breath).    [provider]  isosorbide mononitrate (IMDUR) 30 MG 24 hr tablet TAKE 1 TABLET BY MOUTH DAILY 04/30/17   Sherren Mocha, MD  loratadine (CLARITIN) 10 MG tablet Take 10 mg by mouth daily.    [provider]  LOTEMAX 0.5 % ophthalmic suspension Place 1 drop into both eyes daily.  12/27/13   [provider]  nitroGLYCERIN (NITROSTAT) 0.4 MG SL tablet Place 1 tablet (0.4 mg total) under the tongue every 5 (five) minutes as needed for chest pain. 07/03/15   Richardson Dopp T, PA-C  pantoprazole (PROTONIX) 40 MG tablet Take 1 tablet (40 mg total) by mouth daily. 03/01/17   Sherren Mocha, MD  simvastatin (ZOCOR) 40 MG tablet Take 1 tablet (40 mg total) by mouth at bedtime.  02/26/17   Sherren Mocha, MD  Spacer/Aero-Holding Chambers (AEROCHAMBER MV) inhaler Use as instructed 12/28/16   Collene Gobble, MD  spironolactone (ALDACTONE) 25 MG tablet TAKE 1/2 TABLET BY MOUTH EVERY DAY. 04/16/17   Sherren Mocha, MD     Allergies:    Allergies  Allergen Reactions  . Actonel [Risedronate Sodium] Other (See Comments)    Headache  . Amlodipine Other (See Comments)    Pedal edema  . Fosamax [Alendronate Sodium] Other (See Comments)    Unable to tolerate  . Lisinopril Cough  . Pravastatin Other (See Comments)    Constipation.  . Codeine Rash    Social History:   Social History   Socioeconomic History  . Marital status: Widowed    Spouse name: Not on file  . Number of children: 1  . Years of education: Not on file  . Highest education level: Not on file  Social Needs  . Financial resource strain: Not on file  . Food insecurity - worry: Not on file  . Food insecurity - inability: Not on file  . Transportation needs - medical: Not on file  . Transportation needs - non-medical: Not  on file  Occupational History  . Occupation: retired    Comment: insurance  Tobacco Use  . Smoking status: Former Smoker    Types: Cigarettes    Last attempt to quit: 07/27/2002    Years since quitting: 14.9  . Smokeless tobacco: Never Used  Substance and Sexual Activity  . Alcohol use: No    Alcohol/week: 0.0 oz  . Drug use: No  . Sexual activity: No  Other Topics Concern  . Not on file  Social History Narrative   The patient is a widow and has one son and one dog.     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  She does not drink alcohol. 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   Ed: HS   Activity: no regular exercise   Diet: some water, some fruits/vegetables    Lives in a one story home.     Family History:   The patient's family history includes Breast cancer in her sister; CAD (age of onset: 56) in her father and mother; Diabetes in her sister; Heart attack in her mother; Hypertension in her mother; Stroke in her father and mother.    ROS:  Please see the history of present illness. CHEST PAIN, NAUSEA All other ROS reviewed and negative.     Physical Exam/Data:   Vitals:   07/06/17 2326 07/06/17 2327 07/06/17 2352  BP: 134/90    Pulse: 94    Resp: 19    Temp: 97.8 F (36.6 C)    TempSrc: Temporal    SpO2: 96%  96%  Weight:  125 lb (56.7 kg)   Height:  5\' 6"  (1.676 m)    No intake or output data in the 24 hours ending 07/06/17 2355 Filed Weights   07/06/17 2327  Weight: 125 lb (56.7 kg)   Body mass index is 20.18 kg/m.  VS:  BP 128/85   Pulse 74   Ht 5\' 6"  (1.676 m)   Wt 126 lb 6.4 oz (57.3 kg)   SpO2 97%   BMI 20.40 kg/m    GEN: Well nourished, well developed, in no acute distress  HEENT: normal  Neck: no JVD, carotid bruits, or masses Cardiac:RRR; no murmurs, rubs, or gallops,no  edema  Respiratory:  Diminished breath sound throughout  GI: soft, nontender, nondistended, + BS MS: no  deformity or atrophy  Skin: warm and dry, no rash Neuro:  Alert and Oriented x 3, Strength and sensation are intact Psych: euthymic mood, full affect    EKG:  The ECG that was done was personally reviewed and demonstrates INFERIOR STEMI, IVCD, PVC  Relevant CV Studies:  Additional studies/ records that were reviewed today include:   Echocardiogram: 06/2016 Study Conclusions  - Left ventricle: Septal inferior and inferior lateral wall hypokinesis The cavity size was moderately dilated. Wall thickness was increased in a pattern of mild LVH. Systolic function was moderately to severely reduced. The estimated ejection fraction was in the range of 30% to 35%. - Aortic valve: There was mild regurgitation. - Mitral valve: There was moderate regurgitation. - Left atrium: The atrium was mildly dilated. - Right atrium: The atrium was mildly dilated. - Atrial septum: No defect or patent foramen ovale was identified. - Tricuspid valve: There was moderate regurgitation. - Pulmonary arteries: PA peak pressure: 39 mm Hg (S).   Cardiac Catheterization: 07/09/16 Coronary Stent Intervention  Left Heart Cath and Coronary Angiography  Conclusion     There is severe left ventricular systolic dysfunction.  LV end diastolic pressure is normal.  The left ventricular ejection fraction is 25-35% by visual estimate.  Prox Cx to Mid Cx lesion, 10 %stenosed.  Dist LAD lesion, 100 %stenosed.  Ost 1st Diag lesion, 80 %stenosed.  Prox LAD lesion, 75 %stenosed.  A STENT PROMUS PREM MR 3.0X8 drug eluting stent was successfully placed.  Post intervention, there is a 0% residual stenosis.  1. Single vessel obstructive CAD. The stent in the LCx is still patent. There is occlusion of the very distal LAD that is old. There is a hazy ruptured plaque in the mid LAD that is felt to be the culprit lesion. The first diagonal has a moderate ostial stenosis 2. Severe LV dysfunction with  normal LVEDP 3. Successful stenting of the mid LAD with DES.  Plan: continue DAPT, medical management of residual disease.     ASSESSMENT & PLAN:       Laboratory Data:  Chemistry Recent Labs  Lab 07/06/17 2342  NA 137  K 4.3  CL 103  GLUCOSE 191*  BUN 22*  CREATININE 0.90    No results for input(s): PROT, ALBUMIN, AST, ALT, ALKPHOS, BILITOT in the last 168 hours. Hematology Recent Labs  Lab 07/06/17 2336 07/06/17 2342  WBC 9.7  --   RBC 4.47  --   HGB 14.5 14.6  HCT 41.8 43.0  MCV 93.5  --   MCH 32.4  --   MCHC 34.7  --   RDW 13.5  --   PLT 287  --    Cardiac EnzymesNo results for input(s): TROPONINI in the last 168 hours.  Recent Labs  Lab 07/06/17 2340  TROPIPOC 0.08    BNPNo results for input(s): BNP, PROBNP in the last 168 hours.  DDimer No results for input(s): DDIMER in the last 168 hours.  Radiology/Studies:  No results found.  Assessment and Plan:   1. Inferior STEMI       EMERGENT  TRANSFER TO CATH LAB, Heparin IV  Bolus  Given in ER, CATH LAB  TEAM ACTIVATED.      ASA, nitro , Hep IV  Bolus  Given ,      IV  reglan given  With h/o  Nausea by ER, ASAX4, Oxygen  And  Respiratory  supprt  Requested.      RECENT  CAD s/p BMS to LCx 2010 and DES to pro LAD 06/2016.  - She has residual disease on recent cath as noted above.  Patient  Already  With DAPT with medical therapy. Plavix and statin.   D/W Dr Valla Leaver cards on call.  2. ICM/ Acute on chronic systolic CHF. - EF of 40-76%. Normal LVEDP by cath. Continue lasix 40mg  qd and spironolactone 25mg  qd. . AWAIT  LABS  And  Will also  Cautiously  Diurese, in lieu of contrast  Use. Hx of cough on lisinopril. Lostartan discontinued due to hypotension in past. She has not been able to tolerate beta blockers. Euvolemic on exam. Compliant with low sodium diet. Advised to take extra 20 mg of Lasix for intermittent edema. BP stable now. Consider restarting Losartan during follow up.     3.COPD with hx of Lung cancer - Recently treated with antibiotics and prednisone for COPD exacerbation. Followed by pulmonologist.  4. HTN - Stable and well controlled.   5. HLD - 07/08/2016: Cholesterol 139; HDL 65; LDL Cholesterol 60; Triglycerides 71; VLDL 14 - Continue statin.   6. Bradycardia with dizziness - Noted during pulmonologist appointment. Her pulse and blood pressure is stable today. Noted intermittent bradycardic in 40s at home with poor filling of fatigue, tiredness and dizziness. Concern regarding intermittent bradycardia/conduction disease abnormality. Advised to get vital signs when feeling poor. Get a 48-hour monitor. She is not on any AV blocking agent. She can not lay down for orthostatic vitals due to chronic back and lung issue. Go to ER if worsening of symptoms.  Severity of Illness: The appropriate patient status for this patient is INPATIENT. Inpatient status is judged to be reasonable and necessary in order to provide the required intensity of service to ensure the patient's safety. The patient's presenting symptoms, physical exam findings, and initial radiographic and laboratory data in the context of their chronic comorbidities is felt to place them at high risk for further clinical deterioration. Furthermore, it is not anticipated that the patient will be medically stable for discharge from the hospital within 2 midnights of admission. The following factors support the patient status of inpatient.   " The patient's presenting symptoms include  Severe chest pain, dyspnea, nausea. " The worrisome physical exam findings include  abnl  Lung  sounds. " The initial radiographic and laboratory data are worrisome because of  Inferior STEMI " The chronic co-morbidities include   CAD , HTN ,    * I certify that at the point of admission it is my clinical judgment that the patient will require inpatient hospital care spanning beyond 2 midnights from the point of  admission due to high intensity of service, high risk for further deterioration and high frequency of surveillance required.*    For questions or updates, please contact [place] Please consult [email or web address] for contact info under Cardiology/STEMI.    Signed, Johna Sheriff, MD  07/06/2017 11:55 PM

## 2017-07-06 NOTE — ED Provider Notes (Addendum)
[facility] EMERGENCY DEPARTMENT Provider Note   CSN: [id number] Arrival date & time: 07/06/17  2319     History   Chief Complaint Chief Complaint  Patient presents with  . Chest Pain    HPI [name] is a 76 y.o. female.  The history is provided by the patient and the EMS personnel.  Chest Pain    She had onset about 8 PM of sharp midsternal chest pain without radiation.  There is associated nausea and vomiting.  She has underlying COPD and cannot state if there is any significant change in her baseline dyspnea.  She denies diaphoresis.  Pain was rated at 9/10.  EMS was called and has given her aspirin and nitroglycerin which reduced the pain to 7/10.  She was also given morphine.  She does have history of having had coronary stent replaced.  Other significant past history includes chronic kidney disease, combined systolic and diastolic heart failure, GERD, hypertension, stroke, lung cancer.  Past Medical History:  Diagnosis Date  . Arthritis   . CAD (coronary artery disease) 03/2009   s/p MI  . Chronic combined systolic and diastolic heart failure ([place]) 06/17/2009   Qualifier: Diagnosis of  By: Burt Knack, MD, Clayburn Pert   . CKD (chronic kidney disease) stage 3, GFR 30-59 ml/min (HCC)   . Emphysema/COPD ([place])   . Ex-smoker quit 2008  . GERD (gastroesophageal reflux disease)   . HCAP (healthcare-associated pneumonia) 03/19/2016  . Helicobacter pylori gastritis 10/2007   treated  . History of CVA (cerebrovascular accident) without residual deficits 01/2012, 06/2012   R hemorrhagic MCA 01/2012 with remote lacunar infarct L putamen and IC, rpt 06/2012 acute multifocal R MCA infarct with remote hemorrhagic strokes affecting L basal ganglia and periventricular white matter, full recovery  . HLD (hyperlipidemia)   . HTN (hypertension)   . Ischemic cardiomyopathy 02/27/2014  . Lung cancer ([place]) dx'd 07/2006   Lung CA, s/p resection, followed by Dr Earlie Server  .  Macular degeneration   . Myocardial infarction [facility]) 03/2009   Acute myocardial infarction 2010 - treated with BMS of LCx. LVEF 50% with subsequent CHF  . Osteoporosis 09/2013   T -3.6 forearm 09/2013, T -4.5 forearm 04/2015  . Stroke [facility])     Patient Active Problem List   Diagnosis Date Noted  . URI (upper respiratory infection) 06/09/2017  . Dyspnea 05/03/2017  . Acute bronchitis 12/08/2016  . DNR (do not resuscitate) 12/07/2016  . Allergic rhinitis 07/31/2016  . NSTEMI (non-ST elevated myocardial infarction) ([place])   . Chest pain with moderate risk of acute coronary syndrome 07/08/2016  . Productive cough 06/29/2016  . Symptomatic bradycardia 04/01/2016  . Malnutrition of moderate degree 03/20/2016  . COPD (chronic obstructive pulmonary disease) ([place]) 03/19/2016  . Lumbar pain with radiation down left leg 03/02/2016  . Underweight 10/07/2015  . Vertigo 04/22/2015  . Skin nodule 04/22/2015  . Health maintenance examination 04/08/2015  . Advanced care planning/counseling discussion 04/08/2015  . Vitamin D insufficiency 02/02/2015  . CKD (chronic kidney disease) stage 3, GFR 30-59 ml/min (HCC)   . History of CVA (cerebrovascular accident) without residual deficits   . Prediabetes   . Ischemic cardiomyopathy 02/27/2014  . Osteoporosis 09/24/2013  . Pulmonary nodules 10/26/2012  . TIA (transient ischemic attack) 07/08/2012  . Cancer of lower lobe of right lung ([place]) 04/21/2012  . Chronic combined systolic and diastolic heart failure ([place]) 06/17/2009  . HLD (hyperlipidemia) 04/24/2009  . CAD, NATIVE VESSEL 04/24/2009  .  GERD 04/24/2009  . Macular degeneration (senile) of retina 04/23/2009  . Essential hypertension 04/23/2009  . Osteoarthritis 04/23/2009    Past Surgical History:  Procedure Laterality Date  . BREAST BIOPSY Left   . CARDIAC CATHETERIZATION N/A 02/24/2016   Procedure: Left Heart Cath and Coronary Angiography;  Surgeon: Belva Crome, MD;  Location: [facility]  CV LAB;  Service: Cardiovascular;  Laterality: N/A;  . CARDIAC CATHETERIZATION N/A 07/09/2016   Procedure: Left Heart Cath and Coronary Angiography;  Surgeon: Peter M Martinique, MD;  Location: [facility] CV LAB;  Service: Cardiovascular;  Laterality: N/A;  . CARDIAC CATHETERIZATION N/A 07/09/2016   Procedure: Coronary Stent Intervention;  Surgeon: Peter M Martinique, MD;  Location: [facility] CV LAB;  Service: Cardiovascular;  Laterality: N/A;  . CATARACT EXTRACTION Bilateral   . CORONARY STENT PLACEMENT  06/2016   DES to mid LAD  . EYE SURGERY    . LUNG REMOVAL, PARTIAL Right 2008  . PARTIAL HYSTERECTOMY  1986   irregular periods, ovaries remain    OB History    No data available       Home Medications    Prior to Admission medications   Medication Sig Start Date End Date Taking? Authorizing Provider  albuterol (PROAIR HFA) 108 (90 Base) MCG/ACT inhaler Inhale 2 puffs into the lungs every 4 (four) hours as needed for wheezing or shortness of breath. 03/24/16   de Dios, Kotlik A, MD  albuterol (PROVENTIL) (2.5 MG/3ML) 0.083% nebulizer solution Take 3 mLs (2.5 mg total) by nebulization every 4 (four) hours as needed for wheezing or shortness of breath. 07/31/16   Collene Gobble, MD  aspirin EC 81 MG tablet Take 1 tablet (81 mg total) by mouth daily. 02/26/16   Arbutus Leas, NP  Calcium Carbonate-Vitamin D (CALCIUM-VITAMIN D) 500-200 MG-UNIT per tablet Take 1 tablet by mouth 2 (two) times daily.     [provider]  Cholecalciferol (VITAMIN D) 2000 UNITS CAPS Take 1 capsule by mouth daily.    [provider]  clopidogrel (PLAVIX) 75 MG tablet Take 1 tablet (75 mg total) by mouth daily. 04/01/17   Sherren Mocha, MD  denosumab (PROLIA) 60 MG/ML SOLN injection Inject 60 mg into the skin every 6 (six) months. Administer in upper arm, thigh, or abdomen 06/13/15   Ria Bush, MD  fluticasone [facility]) 50 MCG/ACT nasal spray INSTILL TWO SPRAYS INTO BOTH NOSTRILS DAILY.  04/13/17   Collene Gobble, MD  furosemide (LASIX) 40 MG tablet Take 1 tablet (40 mg total) by mouth daily. Take an additional 1/2 tablet daily for swelling. 04/15/17 04/10/18  Sherren Mocha, MD  ipratropium-albuterol (DUONEB) 0.5-2.5 (3) MG/3ML SOLN Take 3 mLs by nebulization every 4 (four) hours as needed (wheezing or shortness of breath).    [provider]  isosorbide mononitrate (IMDUR) 30 MG 24 hr tablet TAKE 1 TABLET BY MOUTH DAILY 04/30/17   Sherren Mocha, MD  loratadine (CLARITIN) 10 MG tablet Take 10 mg by mouth daily.    [provider]  LOTEMAX 0.5 % ophthalmic suspension Place 1 drop into both eyes daily.  12/27/13   [provider]  nitroGLYCERIN (NITROSTAT) 0.4 MG SL tablet Place 1 tablet (0.4 mg total) under the tongue every 5 (five) minutes as needed for chest pain. 07/03/15   Richardson Dopp T, PA-C  pantoprazole (PROTONIX) 40 MG tablet Take 1 tablet (40 mg total) by mouth daily. 03/01/17   Sherren Mocha, MD  simvastatin (ZOCOR) 40 MG tablet  Take 1 tablet (40 mg total) by mouth at bedtime. 02/26/17   Sherren Mocha, MD  Spacer/Aero-Holding Chambers (AEROCHAMBER MV) inhaler Use as instructed 12/28/16   Collene Gobble, MD  spironolactone (ALDACTONE) 25 MG tablet TAKE 1/2 TABLET BY MOUTH EVERY DAY. 04/16/17   Sherren Mocha, MD    Family History Family History  Problem Relation Age of Onset  . CAD Mother 48       MI  . Stroke Mother   . Hypertension Mother   . Heart attack Mother   . CAD Father 58  . Stroke Father   . Breast cancer Sister   . Diabetes Sister     Social History Social History   Tobacco Use  . Smoking status: Former Smoker    Types: Cigarettes    Last attempt to quit: 07/27/2002    Years since quitting: 14.9  . Smokeless tobacco: Never Used  Substance Use Topics  . Alcohol use: No    Alcohol/week: 0.0 oz  . Drug use: No     Allergies   Actonel [risedronate sodium]; Amlodipine; Fosamax [alendronate sodium]; Lisinopril;  Pravastatin; and Codeine   Review of Systems Review of Systems  Cardiovascular: Positive for chest pain.  All other systems reviewed and are negative.    Physical Exam Updated Vital Signs BP 134/90 (BP Location: Left Arm)   Pulse 94   Temp 97.8 F (36.6 C) (Temporal)   Resp 19   Ht 5\' 6"  (1.676 m)   Wt 56.7 kg (125 lb)   SpO2 96%   BMI 20.18 kg/m   Physical Exam  Nursing note and vitals reviewed.  76 year old female, appears uncomfortable, and nervously handling emesis bag, but in no acute distress. Vital signs are normal. Oxygen saturation is 96%, which is normal. Head is normocephalic and atraumatic. PERRLA, EOMI. Oropharynx is clear. Neck is nontender and supple without adenopathy or JVD. Back is nontender and there is no CVA tenderness. Lungs have faint expiratory wheezes without rales or rhonchi. Chest is nontender. Heart has regular rate and rhythm without murmur. Abdomen is soft, flat, nontender without masses or hepatosplenomegaly and peristalsis is normoactive. Extremities have no cyanosis or edema, full range of motion is present. Skin is warm and dry without rash. Neurologic: Mental status is normal, cranial nerves are intact, there are no motor or sensory deficits.  ED Treatments / Results  Labs (all labs ordered are listed, but only abnormal results are displayed) Labs Reviewed  CBC WITH DIFFERENTIAL/PLATELET - Abnormal; Notable for the following components:      Result Value   Eosinophils Absolute 1.1 (*)    All other components within normal limits  BASIC METABOLIC PANEL - Abnormal; Notable for the following components:   Sodium 134 (*)    Glucose, Bld 188 (*)    BUN 22 (*)    Creatinine, Ser 1.07 (*)    Calcium 8.8 (*)    GFR calc non Af Amer 49 (*)    GFR calc Af Amer 57 (*)    All other components within normal limits  BRAIN NATRIURETIC PEPTIDE - Abnormal; Notable for the following components:   B Natriuretic Peptide 539.4 (*)    All other  components within normal limits  POCT I-STAT, CHEM 8 - Abnormal; Notable for the following components:   BUN 22 (*)    Glucose, Bld 191 (*)    All other components within normal limits  PROTIME-INR  TROPONIN I  TROPONIN I  TROPONIN I  POCT I-STAT TROPONIN I  I-STAT TROPONIN, ED  I-STAT CHEM 8, ED    EKG  EKG Interpretation  Date/Time:  Tuesday July 06 2017 23:26:04 EST Ventricular Rate:  94 PR Interval:    QRS Duration: 151 QT Interval:  410 QTC Calculation: 513 R Axis:   104 Text Interpretation:  Sinus rhythm Premature ventricular complexes Nonspecific intraventricular conduction delay Inferolateral infarct, age indeterminate Inferior infarct, acute (RCA) Baseline wander in lead(s) II III aVF When compared with ECG of 07/10/2016, ** ** ACUTE MI / STEMI ** ** is now present Confirmed by Delora Fuel ([id number]) on 07/06/2017 11:32:03 PM      Procedures Procedures (including critical care time) CRITICAL CARE Performed by: Delora Fuel Total critical care time: 35 minutes Critical care time was exclusive of separately billable procedures and treating other patients. Critical care was necessary to treat or prevent imminent or life-threatening deterioration. Critical care was time spent personally by me on the following activities: development of treatment plan with patient and/or surrogate as well as nursing, discussions with consultants, evaluation of patient's response to treatment, examination of patient, obtaining history from patient or surrogate, ordering and performing treatments and interventions, ordering and review of laboratory studies, ordering and review of radiographic studies, pulse oximetry and re-evaluation of patient's condition.  Medications Ordered in ED Medications  heparin bolus via infusion 4,000 Units (not administered)  metoCLOPramide (REGLAN) injection 10 mg (not administered)  diphenhydrAMINE (BENADRYL) injection 25 mg (not administered)  heparin lock  flush 100 unit/mL (not administered)     Initial Impression / Assessment and Plan / ED Course  I have reviewed the triage vital signs and the nursing notes.  Pertinent lab results that were available during my care of the patient were reviewed by me and considered in my medical decision making (see chart for details).  STEMI involving inferior leads.  Cardiology fellow Dr. Teena Dunk was present and evaluated the patient with me.  She will be taken emergently to the catheterization lab for emergent PCI.  Review of past records confirms STEMI with stent placement in July 2017, and admission for non-STEMI in December 2017.  Final Clinical Impressions(s) / ED Diagnoses   Final diagnoses:  ST elevation myocardial infarction (STEMI), unspecified artery [facility])    ED Discharge Orders    None       Delora Fuel, MD 56/38/93 [id number]    Delora Fuel, MD 87/68/11 [phone]

## 2017-07-06 NOTE — ED Provider Notes (Signed)
Pre-hospital EKGs reviewed. Chronic LBBB. Did not advise Code STEMI but apparently paged out anyways.    Virgel Manifold, MD 07/06/17 [phone]

## 2017-07-06 NOTE — ED Notes (Signed)
Patient evaluated by Dr. Roxanne Mins and Dr. Teena Dunk ( cardiologist) .

## 2017-07-07 ENCOUNTER — Other Ambulatory Visit: Payer: Self-pay

## 2017-07-07 ENCOUNTER — Encounter (HOSPITAL_COMMUNITY): Payer: Self-pay

## 2017-07-07 ENCOUNTER — Telehealth: Payer: Self-pay

## 2017-07-07 ENCOUNTER — Inpatient Hospital Stay (HOSPITAL_COMMUNITY): Payer: PPO

## 2017-07-07 DIAGNOSIS — I2111 ST elevation (STEMI) myocardial infarction involving right coronary artery: Secondary | ICD-10-CM

## 2017-07-07 DIAGNOSIS — I2119 ST elevation (STEMI) myocardial infarction involving other coronary artery of inferior wall: Principal | ICD-10-CM

## 2017-07-07 DIAGNOSIS — I361 Nonrheumatic tricuspid (valve) insufficiency: Secondary | ICD-10-CM

## 2017-07-07 DIAGNOSIS — I493 Ventricular premature depolarization: Secondary | ICD-10-CM

## 2017-07-07 DIAGNOSIS — I219 Acute myocardial infarction, unspecified: Secondary | ICD-10-CM | POA: Diagnosis present

## 2017-07-07 DIAGNOSIS — I255 Ischemic cardiomyopathy: Secondary | ICD-10-CM

## 2017-07-07 DIAGNOSIS — I2510 Atherosclerotic heart disease of native coronary artery without angina pectoris: Secondary | ICD-10-CM

## 2017-07-07 LAB — TROPONIN I
TROPONIN I: 0.65 ng/mL — AB (ref <0.03)
TROPONIN I: 0.78 ng/mL — AB (ref <0.03)
TROPONIN I: 3.71 ng/mL — AB (ref <0.03)

## 2017-07-07 LAB — BASIC METABOLIC PANEL
ANION GAP: 9 (ref 5–15)
Anion gap: 7 (ref 5–15)
BUN: 20 mg/dL (ref 6–20)
BUN: 22 mg/dL — ABNORMAL HIGH (ref 6–20)
CHLORIDE: 104 mmol/L (ref 101–111)
CO2: 22 mmol/L (ref 22–32)
CO2: 24 mmol/L (ref 22–32)
CREATININE: 0.85 mg/dL (ref 0.44–1.00)
Calcium: 8.2 mg/dL — ABNORMAL LOW (ref 8.9–10.3)
Calcium: 8.8 mg/dL — ABNORMAL LOW (ref 8.9–10.3)
Chloride: 101 mmol/L (ref 101–111)
Creatinine, Ser: 1.07 mg/dL — ABNORMAL HIGH (ref 0.44–1.00)
GFR calc Af Amer: 57 mL/min — ABNORMAL LOW (ref >60)
GFR, EST NON AFRICAN AMERICAN: 49 mL/min — AB (ref >60)
Glucose, Bld: 132 mg/dL — ABNORMAL HIGH (ref 65–99)
Glucose, Bld: 188 mg/dL — ABNORMAL HIGH (ref 65–99)
POTASSIUM: 4.3 mmol/L (ref 3.5–5.1)
Potassium: 4.5 mmol/L (ref 3.5–5.1)
SODIUM: 133 mmol/L — AB (ref 135–145)
SODIUM: 134 mmol/L — AB (ref 135–145)

## 2017-07-07 LAB — POCT ACTIVATED CLOTTING TIME
ACTIVATED CLOTTING TIME: 169 s
ACTIVATED CLOTTING TIME: 483 s

## 2017-07-07 LAB — CBC
HCT: 39.1 % (ref 36.0–46.0)
Hemoglobin: 13.2 g/dL (ref 12.0–15.0)
MCH: 31.4 pg (ref 26.0–34.0)
MCHC: 33.8 g/dL (ref 30.0–36.0)
MCV: 93.1 fL (ref 78.0–100.0)
PLATELETS: 253 10*3/uL (ref 150–400)
RBC: 4.2 MIL/uL (ref 3.87–5.11)
RDW: 13.5 % (ref 11.5–15.5)
WBC: 7.9 10*3/uL (ref 4.0–10.5)

## 2017-07-07 LAB — ECHOCARDIOGRAM COMPLETE
HEIGHTINCHES: 66 in
Weight: 1996.49 oz

## 2017-07-07 LAB — MRSA PCR SCREENING: MRSA BY PCR: NEGATIVE

## 2017-07-07 LAB — HEPARIN LEVEL (UNFRACTIONATED): HEPARIN UNFRACTIONATED: 0.22 [IU]/mL — AB (ref 0.30–0.70)

## 2017-07-07 LAB — BRAIN NATRIURETIC PEPTIDE: B NATRIURETIC PEPTIDE 5: 539.4 pg/mL — AB (ref 0.0–100.0)

## 2017-07-07 MED ORDER — ISOSORBIDE MONONITRATE ER 30 MG PO TB24
30.0000 mg | ORAL_TABLET | Freq: Every day | ORAL | Status: DC
  Administered 2017-07-07 – 2017-07-09 (×3): 30 mg via ORAL
  Filled 2017-07-07 (×3): qty 1

## 2017-07-07 MED ORDER — BIVALIRUDIN TRIFLUOROACETATE 250 MG IV SOLR
INTRAVENOUS | Status: AC
  Filled 2017-07-07: qty 250

## 2017-07-07 MED ORDER — ASPIRIN 81 MG PO CHEW: 81.0000 mg | CHEWABLE_TABLET | Freq: Every day | ORAL | Status: DC

## 2017-07-07 MED ORDER — CARVEDILOL 3.125 MG PO TABS
3.1250 mg | ORAL_TABLET | Freq: Two times a day (BID) | ORAL | Status: DC
  Administered 2017-07-07 – 2017-07-09 (×4): 3.125 mg via ORAL
  Filled 2017-07-07 (×4): qty 1

## 2017-07-07 MED ORDER — ONDANSETRON HCL 4 MG/2ML IJ SOLN: 4.0000 mg | Freq: Four times a day (QID) | INTRAMUSCULAR | Status: DC | PRN

## 2017-07-07 MED ORDER — BIVALIRUDIN BOLUS VIA INFUSION - CUPID
INTRAVENOUS | Status: DC | PRN
  Administered 2017-07-07: 42.525 mg via INTRAVENOUS

## 2017-07-07 MED ORDER — BIVALIRUDIN TRIFLUOROACETATE 250 MG IV SOLR
INTRAVENOUS | Status: AC | PRN
  Administered 2017-07-07: 1.75 mg/kg/h via INTRAVENOUS

## 2017-07-07 MED ORDER — NITROGLYCERIN 0.4 MG SL SUBL: 0.4000 mg | SUBLINGUAL_TABLET | SUBLINGUAL | Status: DC | PRN

## 2017-07-07 MED ORDER — ACETAMINOPHEN 325 MG PO TABS: 650.0000 mg | ORAL_TABLET | ORAL | Status: DC | PRN

## 2017-07-07 MED ORDER — ATROPINE SULFATE 1 MG/10ML IJ SOSY
PREFILLED_SYRINGE | INTRAMUSCULAR | Status: AC
  Filled 2017-07-07: qty 10

## 2017-07-07 MED ORDER — VITAMIN D 1000 UNITS PO TABS
2000.0000 [IU] | ORAL_TABLET | Freq: Every day | ORAL | Status: DC
  Administered 2017-07-07 – 2017-07-09 (×3): 2000 [IU] via ORAL
  Filled 2017-07-07 (×3): qty 2

## 2017-07-07 MED ORDER — HYDRALAZINE HCL 20 MG/ML IJ SOLN: 5.0000 mg | INTRAMUSCULAR | Status: AC | PRN

## 2017-07-07 MED ORDER — IOPAMIDOL (ISOVUE-370) INJECTION 76%
INTRAVENOUS | Status: AC
  Filled 2017-07-07: qty 50

## 2017-07-07 MED ORDER — SODIUM CHLORIDE 0.9 % IV SOLN
INTRAVENOUS | Status: AC
  Administered 2017-07-07: 02:00:00 via INTRAVENOUS

## 2017-07-07 MED ORDER — SIMVASTATIN 40 MG PO TABS
40.0000 mg | ORAL_TABLET | Freq: Every day | ORAL | Status: DC
  Administered 2017-07-07 – 2017-07-08 (×3): 40 mg via ORAL
  Filled 2017-07-07 (×3): qty 1

## 2017-07-07 MED ORDER — ASPIRIN EC 81 MG PO TBEC: 81.0000 mg | DELAYED_RELEASE_TABLET | Freq: Every day | ORAL | Status: DC

## 2017-07-07 MED ORDER — ASPIRIN 81 MG PO CHEW: 324.0000 mg | CHEWABLE_TABLET | Freq: Once | ORAL | Status: DC

## 2017-07-07 MED ORDER — SODIUM CHLORIDE 0.9 % IV SOLN
INTRAVENOUS | Status: AC | PRN
  Administered 2017-07-06: 10 mL/h via INTRAVENOUS

## 2017-07-07 MED ORDER — CLOPIDOGREL BISULFATE 75 MG PO TABS
75.0000 mg | ORAL_TABLET | Freq: Every day | ORAL | Status: DC
  Administered 2017-07-07: 75 mg via ORAL
  Filled 2017-07-07: qty 1

## 2017-07-07 MED ORDER — SPIRONOLACTONE 12.5 MG HALF TABLET
12.5000 mg | ORAL_TABLET | Freq: Two times a day (BID) | ORAL | Status: DC
  Administered 2017-07-07 – 2017-07-09 (×4): 12.5 mg via ORAL
  Filled 2017-07-07 (×6): qty 1

## 2017-07-07 MED ORDER — ALBUTEROL SULFATE (2.5 MG/3ML) 0.083% IN NEBU: 2.5000 mg | INHALATION_SOLUTION | RESPIRATORY_TRACT | Status: DC | PRN

## 2017-07-07 MED ORDER — LABETALOL HCL 5 MG/ML IV SOLN: 10.0000 mg | INTRAVENOUS | Status: AC | PRN

## 2017-07-07 MED ORDER — ASPIRIN EC 81 MG PO TBEC
81.0000 mg | DELAYED_RELEASE_TABLET | Freq: Every day | ORAL | Status: DC
  Administered 2017-07-07 – 2017-07-09 (×3): 81 mg via ORAL
  Filled 2017-07-07 (×3): qty 1

## 2017-07-07 MED ORDER — ONDANSETRON HCL 4 MG/2ML IJ SOLN
4.0000 mg | Freq: Four times a day (QID) | INTRAMUSCULAR | Status: DC | PRN
  Administered 2017-07-07 (×2): 4 mg via INTRAVENOUS
  Filled 2017-07-07 (×2): qty 2

## 2017-07-07 MED ORDER — IPRATROPIUM-ALBUTEROL 0.5-2.5 (3) MG/3ML IN SOLN
3.0000 mL | Freq: Three times a day (TID) | RESPIRATORY_TRACT | Status: DC
  Administered 2017-07-07 (×2): 3 mL via RESPIRATORY_TRACT
  Filled 2017-07-07 (×2): qty 3

## 2017-07-07 MED ORDER — SODIUM CHLORIDE 0.9 % IV SOLN: 250.0000 mL | INTRAVENOUS | Status: DC | PRN

## 2017-07-07 MED ORDER — PANTOPRAZOLE SODIUM 40 MG PO TBEC
40.0000 mg | DELAYED_RELEASE_TABLET | Freq: Every day | ORAL | Status: DC
  Administered 2017-07-07 – 2017-07-09 (×3): 40 mg via ORAL
  Filled 2017-07-07 (×3): qty 1

## 2017-07-07 MED ORDER — CLOPIDOGREL BISULFATE 75 MG PO TABS: 75.0000 mg | ORAL_TABLET | Freq: Every day | ORAL | Status: DC

## 2017-07-07 MED ORDER — IPRATROPIUM-ALBUTEROL 0.5-2.5 (3) MG/3ML IN SOLN
3.0000 mL | RESPIRATORY_TRACT | Status: DC | PRN
  Administered 2017-07-07: 3 mL via RESPIRATORY_TRACT
  Filled 2017-07-07: qty 3

## 2017-07-07 MED ORDER — IOPAMIDOL (ISOVUE-370) INJECTION 76%
INTRAVENOUS | Status: DC | PRN
  Administered 2017-07-07: 115 mL via INTRAVENOUS

## 2017-07-07 MED ORDER — SODIUM CHLORIDE 0.9 % IV SOLN: INTRAVENOUS | Status: DC

## 2017-07-07 MED ORDER — ORAL CARE MOUTH RINSE
15.0000 mL | Freq: Two times a day (BID) | OROMUCOSAL | Status: DC
  Administered 2017-07-09: 15 mL via OROMUCOSAL

## 2017-07-07 MED ORDER — ASPIRIN 81 MG PO CHEW: 324.0000 mg | CHEWABLE_TABLET | ORAL | Status: DC

## 2017-07-07 MED ORDER — ASPIRIN 300 MG RE SUPP: 300.0000 mg | RECTAL | Status: DC

## 2017-07-07 MED ORDER — SODIUM CHLORIDE 0.9% FLUSH
3.0000 mL | Freq: Two times a day (BID) | INTRAVENOUS | Status: DC
  Administered 2017-07-07 – 2017-07-09 (×4): 3 mL via INTRAVENOUS

## 2017-07-07 MED ORDER — TICAGRELOR 90 MG PO TABS
90.0000 mg | ORAL_TABLET | Freq: Two times a day (BID) | ORAL | Status: DC
  Administered 2017-07-08 – 2017-07-09 (×4): 90 mg via ORAL
  Filled 2017-07-07 (×4): qty 1

## 2017-07-07 MED ORDER — HEPARIN (PORCINE) IN NACL 100-0.45 UNIT/ML-% IJ SOLN
850.0000 [IU]/h | INTRAMUSCULAR | Status: AC
  Administered 2017-07-07: 750 [IU]/h via INTRAVENOUS
  Filled 2017-07-07 (×2): qty 250

## 2017-07-07 MED ORDER — LOTEPREDNOL ETABONATE 0.5 % OP SUSP
1.0000 [drp] | Freq: Every day | OPHTHALMIC | Status: DC
  Administered 2017-07-07 – 2017-07-09 (×3): 1 [drp] via OPHTHALMIC
  Filled 2017-07-07: qty 5

## 2017-07-07 MED ORDER — HEPARIN (PORCINE) IN NACL 2-0.9 UNIT/ML-% IJ SOLN
INTRAMUSCULAR | Status: AC | PRN
  Administered 2017-07-07: 1000 mL

## 2017-07-07 MED ORDER — SODIUM CHLORIDE 0.9% FLUSH
3.0000 mL | INTRAVENOUS | Status: DC | PRN
  Administered 2017-07-07: 3 mL via INTRAVENOUS
  Filled 2017-07-07: qty 3

## 2017-07-07 MED ORDER — TICAGRELOR 90 MG PO TABS
180.0000 mg | ORAL_TABLET | Freq: Once | ORAL | Status: AC
  Administered 2017-07-07: 180 mg via ORAL
  Filled 2017-07-07: qty 2

## 2017-07-07 MED ORDER — SPIRONOLACTONE 25 MG PO TABS
12.5000 mg | ORAL_TABLET | Freq: Every day | ORAL | Status: DC
  Administered 2017-07-07: 12.5 mg via ORAL
  Filled 2017-07-07: qty 1
  Filled 2017-07-07: qty 0.5

## 2017-07-07 MED ORDER — HEPARIN SODIUM (PORCINE) 5000 UNIT/ML IJ SOLN: 60.0000 [IU]/kg | Freq: Once | INTRAMUSCULAR | Status: DC

## 2017-07-07 MED ORDER — FUROSEMIDE 40 MG PO TABS
40.0000 mg | ORAL_TABLET | Freq: Every day | ORAL | Status: DC
  Administered 2017-07-07 – 2017-07-08 (×2): 40 mg via ORAL
  Filled 2017-07-07 (×2): qty 1

## 2017-07-07 NOTE — Progress Notes (Signed)
#  4. LAUREN @ [employer] # [phone] OPT--2    BRILINTA 90 MG BID    COVER- YES  CO-PAY- $ 45.00  TIER- 3 DRUG  PRIOR APPROVAL- NO  DEDUCTIBLE -MET    PREFERRED PHARMACY : [facility]

## 2017-07-07 NOTE — Progress Notes (Signed)
[facility] for Heparin Indication: chest pain/ACS  Allergies  Allergen Reactions  . Actonel [Risedronate Sodium] Other (See Comments)    Headache  . Amlodipine Other (See Comments)    Pedal edema  . Fosamax [Alendronate Sodium] Other (See Comments)    Unable to tolerate  . Lisinopril Cough  . Pravastatin Other (See Comments)    Constipation.  . Codeine Rash    Patient Measurements: Height: 5\' 6"  (167.6 cm) Weight: 124 lb 12.5 oz (56.6 kg) IBW/kg (Calculated) : 59.3   Labs: Recent Labs    07/06/17 2336 07/06/17 2342 07/07/17 0228 07/07/17 0325 07/07/17 1242 07/07/17 1628  HGB 14.5 14.6  --  13.2  --   --   HCT 41.8 43.0  --  39.1  --   --   PLT 287  --   --  253  --   --   LABPROT 12.9  --   --   --   --   --   INR 0.98  --   --   --   --   --   HEPARINUNFRC  --   --   --   --   --  0.22*  CREATININE 1.07* 0.90  --  0.85  --   --   TROPONINI  --   --  0.65* 0.78* 3.71*  --     Estimated Creatinine Clearance: 50.3 mL/min (by C-G formula based on SCr of 0.85 mg/dL).      Assessment: 76 y.o. female admitted with STEMI s/p cath, for heparin x 24 hours Initial heparin level = 0.22  Goal of Therapy:  Heparin level 0.3-0.7 units/ml Monitor platelets by anticoagulation protocol: Yes   Plan:  Increase heparin to 850 units / hr Follow up AM level  Thank you Anette Guarneri, PharmD [phone]  07/07/2017,5:29 PM

## 2017-07-07 NOTE — Progress Notes (Signed)
RN attempted to to call [name] to return her call. Family member did not answer the phone despite multiple attempts. Will make incoming RN aware.   [name] E [name], [place]

## 2017-07-07 NOTE — Care Management Note (Addendum)
Case Management Note  Patient Details  Name: [name] MRN: [id number] Date of Birth: 1941/01/17  Subjective/Objective:   Form home alone,  pta indep with rollator, s/p coronary stent, will be on plavix and asa. NCM notified that patient is being changed to [facility].  NCM awaiting benefit check for brilinta.  NCM gave patient the 30 day savings brilinta card, and her co pay amt of 45.00.  [facility] pharmacy does not have in stock informed patient to take [facility].                   Action/Plan: NCM awaiting benefit check for brilinta.    Expected Discharge Date:                  Expected Discharge Plan:  Home/Self Care  In-House Referral:     Discharge planning Services  CM Consult  Post Acute Care Choice:    Choice offered to:     DME Arranged:    DME Agency:     HH Arranged:    HH Agency:     Status of Service:  In process, will continue to follow  If discussed at Long Length of Stay Meetings, dates discussed:    Additional Comments:  Zenon Mayo, RN 07/07/2017, 11:06 AM

## 2017-07-07 NOTE — Progress Notes (Signed)
ANTICOAGULATION CONSULT NOTE - Initial Consult  Pharmacy Consult for Heparin Indication: chest pain/ACS  Allergies  Allergen Reactions  . Actonel [Risedronate Sodium] Other (See Comments)    Headache  . Amlodipine Other (See Comments)    Pedal edema  . Fosamax [Alendronate Sodium] Other (See Comments)    Unable to tolerate  . Lisinopril Cough  . Pravastatin Other (See Comments)    Constipation.  . Codeine Rash    Patient Measurements: Height: 5\' 6"  (167.6 cm) Weight: 124 lb 12.5 oz (56.6 kg) IBW/kg (Calculated) : 59.3  Vital Signs: Temp: 97.6 F (36.4 C) (12/12 0338) Temp Source: Oral (12/12 0338) BP: 105/55 (12/12 0445) Pulse Rate: 78 (12/12 0445)  Labs: Recent Labs    07/06/17 2336 07/06/17 2342 07/07/17 0228 07/07/17 0325  HGB 14.5 14.6  --  13.2  HCT 41.8 43.0  --  39.1  PLT 287  --   --  253  LABPROT 12.9  --   --   --   INR 0.98  --   --   --   CREATININE 1.07* 0.90  --  0.85  TROPONINI  --   --  0.65* 0.78*    Estimated Creatinine Clearance: 50.3 mL/min (by C-G formula based on SCr of 0.85 mg/dL).   Medical History: Past Medical History:  Diagnosis Date  . Arthritis   . CAD (coronary artery disease) 03/2009   s/p MI  . Chronic combined systolic and diastolic heart failure ([place]) 06/17/2009   Qualifier: Diagnosis of  By: Burt Knack, MD, Clayburn Pert   . CKD (chronic kidney disease) stage 3, GFR 30-59 ml/min (HCC)   . Emphysema/COPD ([place])   . Ex-smoker quit 2008  . GERD (gastroesophageal reflux disease)   . HCAP (healthcare-associated pneumonia) 03/19/2016  . Helicobacter pylori gastritis 10/2007   treated  . History of CVA (cerebrovascular accident) without residual deficits 01/2012, 06/2012   R hemorrhagic MCA 01/2012 with remote lacunar infarct L putamen and IC, rpt 06/2012 acute multifocal R MCA infarct with remote hemorrhagic strokes affecting L basal ganglia and periventricular white matter, full recovery  . HLD (hyperlipidemia)   . HTN  (hypertension)   . Ischemic cardiomyopathy 02/27/2014  . Lung cancer ([place]) dx'd 07/2006   Lung CA, s/p resection, followed by Dr Earlie Server  . Macular degeneration   . Myocardial infarction [facility]) 03/2009   Acute myocardial infarction 2010 - treated with BMS of LCx. LVEF 50% with subsequent CHF  . Osteoporosis 09/2013   T -3.6 forearm 09/2013, T -4.5 forearm 04/2015  . Stroke [facility])     Medications:  Medications Prior to Admission  Medication Sig Dispense Refill Last Dose  . albuterol (PROAIR HFA) 108 (90 Base) MCG/ACT inhaler Inhale 2 puffs into the lungs every 4 (four) hours as needed for wheezing or shortness of breath. 1 Inhaler 1 Taking  . albuterol (PROVENTIL) (2.5 MG/3ML) 0.083% nebulizer solution Take 3 mLs (2.5 mg total) by nebulization every 4 (four) hours as needed for wheezing or shortness of breath. 150 mL 5 Taking  . aspirin EC 81 MG tablet Take 1 tablet (81 mg total) by mouth daily.   Taking  . Calcium Carbonate-Vitamin D (CALCIUM-VITAMIN D) 500-200 MG-UNIT per tablet Take 1 tablet by mouth 2 (two) times daily.    Taking  . Cholecalciferol (VITAMIN D) 2000 UNITS CAPS Take 1 capsule by mouth daily.   Taking  . clopidogrel (PLAVIX) 75 MG tablet Take 1 tablet (75 mg total) by mouth daily. 30 tablet 11 Taking  .  denosumab (PROLIA) 60 MG/ML SOLN injection Inject 60 mg into the skin every 6 (six) months. Administer in upper arm, thigh, or abdomen 60 mL 1 Taking  . fluticasone (FLONASE) 50 MCG/ACT nasal spray INSTILL TWO SPRAYS INTO BOTH NOSTRILS DAILY. 16 g 5 Taking  . furosemide (LASIX) 40 MG tablet Take 1 tablet (40 mg total) by mouth daily. Take an additional 1/2 tablet daily for swelling. 40 tablet 11 Taking  . ipratropium-albuterol (DUONEB) 0.5-2.5 (3) MG/3ML SOLN Take 3 mLs by nebulization every 4 (four) hours as needed (wheezing or shortness of breath).   Taking  . isosorbide mononitrate (IMDUR) 30 MG 24 hr tablet TAKE 1 TABLET BY MOUTH DAILY 30 tablet 10 Taking  . loratadine  (CLARITIN) 10 MG tablet Take 10 mg by mouth daily.   Taking  . LOTEMAX 0.5 % ophthalmic suspension Place 1 drop into both eyes daily.    Taking  . nitroGLYCERIN (NITROSTAT) 0.4 MG SL tablet Place 1 tablet (0.4 mg total) under the tongue every 5 (five) minutes as needed for chest pain. 25 tablet 3 Taking  . pantoprazole (PROTONIX) 40 MG tablet Take 1 tablet (40 mg total) by mouth daily. 30 tablet 7 Taking  . simvastatin (ZOCOR) 40 MG tablet Take 1 tablet (40 mg total) by mouth at bedtime. 30 tablet 6 Taking  . Spacer/Aero-Holding Chambers (AEROCHAMBER MV) inhaler Use as instructed 1 each 0 Taking  . spironolactone (ALDACTONE) 25 MG tablet TAKE 1/2 TABLET BY MOUTH EVERY DAY. 45 tablet 3 Taking    Assessment: 76 y.o. female admitted with STEMI s/p cath, for heparin x 24 hours  Goal of Therapy:  Heparin level 0.3-0.7 units/ml Monitor platelets by anticoagulation protocol: Yes   Plan:  Start heparin 750 units/hr at 0830 Check heparin level in 8 hours.   Judye Lorino, [place]onson Curb 07/07/2017,5:31 AM

## 2017-07-07 NOTE — Progress Notes (Signed)
CARDIAC REHAB PHASE I   PRE:  Rate/Rhythm: 87 SR PVCs  BP:  Supine:   Sitting: 114/75  Standing:    SaO2: 92% 1L  87% RA  MODE:  Ambulation: chair ft   POST:  Rate/Rhythm: 91 SR PVCs  BP:  Supine:   Sitting: 124/73  Standing:    SaO2: 94% 1L 1100-1147 Pt assessed by Dr Claiborne Billings. Assisted pt to recliner after she finished her breakfast. Right leg a little sore from groin site. Helped her get comfortable in recliner. Tried RA for just a minute and desat. Put back on oxygen. Pt does not have oxygen at home. Gave MI booklet and heart healthy diet for her to read. Reviewed NTG use and MI restrictions. Pt has attempted to attend CRP 2 [place] in past and stated it was too much. Made referral to [facility] but pt does not want to attend again as she feels she cannot tolerate.  Has only been walking mainly in house. Pt will need to see case manager since being switched to [place]. Will follow up tomorrow for increase in activity. Having PVCs today so only to recliner.   Graylon Good, RN BSN  07/07/2017 11:43 AM

## 2017-07-07 NOTE — Progress Notes (Signed)
Cardiologist on call notified about elevated troponin.  RN was asked not to call if troponin was elevated.   Will continue to monitor.  Jaser Fullen E Reola Mosher, [place]

## 2017-07-07 NOTE — Progress Notes (Signed)
  Echocardiogram 2D Echocardiogram has been performed.  [name] 07/07/2017, 2:22 PM

## 2017-07-07 NOTE — Progress Notes (Signed)
Progress Note  Patient Name: [name] Date of Encounter: 07/07/2017  Primary Cardiologist: Burt Knack  Subjective   No recurrent chest pain  Inpatient Medications    Scheduled Meds: . aspirin EC  81 mg Oral Daily  . cholecalciferol  2,000 Units Oral Daily  . clopidogrel  75 mg Oral Daily  . diphenhydrAMINE  25 mg Intravenous Once  . furosemide  40 mg Oral Daily  . ipratropium-albuterol  3 mL Nebulization TID  . isosorbide mononitrate  30 mg Oral Daily  . loteprednol  1 drop Both Eyes Daily  . [START ON 07/08/2017] mouth rinse  15 mL Mouth Rinse BID  . metoCLOPramide (REGLAN) injection  10 mg Intravenous Once  . pantoprazole  40 mg Oral Daily  . simvastatin  40 mg Oral QHS  . sodium chloride flush  3 mL Intravenous Q12H  . spironolactone  12.5 mg Oral Daily   Continuous Infusions: . sodium chloride    . sodium chloride    . heparin 750 Units/hr (07/07/17 1000)   PRN Meds: sodium chloride, acetaminophen, albuterol, nitroGLYCERIN, ondansetron (ZOFRAN) IV, sodium chloride flush   Vital Signs    Vitals:   07/07/17 0800 07/07/17 0843 07/07/17 0900 07/07/17 1000  BP:   111/71 (!) 107/58  Pulse: 80  84 79  Resp: 16  (!) 25 (!) 21  Temp:  (!) 96.7 F (35.9 C)    TempSrc:  Axillary    SpO2: 96% 98% 93% 96%  Weight:      Height:        Intake/Output Summary (Last 24 hours) at 07/07/2017 1105 Last data filed at 07/07/2017 1000 Gross per 24 hour  Intake 519.83 ml  Output -  Net 519.83 ml    I/O since admission:  -[place]   07/06/17 2327 07/07/17 0200  Weight: 125 lb (56.7 kg) 124 lb 12.5 oz (56.6 kg)    Telemetry    Sinus 89 - Personally Reviewed  ECG    ECG (independently read by me): NSR at 76 with PVCs, LBBB  Physical Exam   BP (!) 107/58   Pulse 79   Temp (!) 96.7 F (35.9 C) (Axillary)   Resp (!) 21   Ht 5\' 6"  (1.676 m)   Wt 124 lb 12.5 oz (56.6 kg)   SpO2 96%   BMI 20.14 kg/m  General: Alert, oriented, no distress.    Skin: normal turgor, no rashes, warm and dry HEENT: Normocephalic, atraumatic. Pupils equal round and reactive to light; sclera anicteric; extraocular muscles intact;  Nose without nasal septal hypertrophy Mouth/Parynx benign; Mallinpatti scale 2 Neck: No JVD, no carotid bruits; normal carotid upstroke Lungs: clear to ausculatation and percussion; no wheezing or rales Chest wall: without tenderness to palpitation Heart: PMI not displaced, RRR, s1 s2 normal, 1/6 systolic murmur, no diastolic murmur, no rubs, gallops, thrills, or heaves Abdomen: soft, nontender; no hepatosplenomehaly, BS+; abdominal aorta nontender and not dilated by palpation. Back: no CVA tenderness Pulses 2+; R groin cath site stable Musculoskeletal: full range of motion, normal strength, no joint deformities Extremities: no clubbing cyanosis or edema, Homan's sign negative  Neurologic: grossly nonfocal; Cranial nerves grossly wnl Psychologic: Normal mood and affect   Labs    Chemistry Recent Labs  Lab 07/06/17 2336 07/06/17 2342 07/07/17 0325  NA 134* 137 133*  K 4.3 4.3 4.5  CL 101 103 104  CO2 24  --  22  GLUCOSE 188* 191* 132*  BUN 22* 22* 20  CREATININE 1.07* 0.90 0.85  CALCIUM 8.8*  --  8.2*  GFRNONAA 49*  --  >60  GFRAA 57*  --  >60  ANIONGAP 9  --  7     Hematology Recent Labs  Lab 07/06/17 2336 07/06/17 2342 07/07/17 0325  WBC 9.7  --  7.9  RBC 4.47  --  4.20  HGB 14.5 14.6 13.2  HCT 41.8 43.0 39.1  MCV 93.5  --  93.1  MCH 32.4  --  31.4  MCHC 34.7  --  33.8  RDW 13.5  --  13.5  PLT 287  --  253    Cardiac Enzymes Recent Labs  Lab 07/07/17 0228 07/07/17 0325  TROPONINI 0.65* 0.78*    Recent Labs  Lab 07/06/17 2340  TROPIPOC 0.08     BNP Recent Labs  Lab 07/06/17 2336  BNP 539.4*     DDimer No results for input(s): DDIMER in the last 168 hours.   Lipid Panel     Component Value Date/Time   CHOL 139 07/08/2016 0800   TRIG 71 07/08/2016 0800   HDL 65  07/08/2016 0800   CHOLHDL 2.1 07/08/2016 0800   VLDL 14 07/08/2016 0800   LDLCALC 60 07/08/2016 0800   LDLDIRECT 50.0 11/25/2016 1131    Radiology    Dg Chest Port 1 View  Result Date: 07/07/2017 CLINICAL DATA:  Acute onset of shortness of breath. EXAM: PORTABLE CHEST 1 VIEW COMPARISON:  Chest radiograph performed 05/03/2017, and CT of the chest performed 07/02/2017 FINDINGS: The lungs are well-aerated. Vascular congestion is noted. Increased interstitial markings raise concern for pulmonary edema. There is no evidence of pleural effusion or pneumothorax. Known pulmonary nodules are not well characterized on radiograph. The cardiomediastinal silhouette is borderline enlarged. No acute osseous abnormalities are seen. IMPRESSION: Vascular congestion and borderline cardiomegaly. Increased interstitial markings raise concern for pulmonary edema. Electronically Signed   By: Garald Balding M.D.   On: 07/07/2017 02:38    Cardiac Studies   Conclusion     Dist LAD lesion is 100% stenosed.  Ost 1st Diag lesion is 80% stenosed.  Previously placed Prox Cx to Mid Cx stent (unknown type) is widely patent.  RPDA lesion is 100% stenosed.  Prox LAD-1 lesion is 40% stenosed.  Previously placed Prox LAD-2 drug eluting stent is widely patent.  Balloon angioplasty was performed.  Post intervention, there is a 100% residual stenosis.   IMPRESSION: Acute inferior STEMI related to occlusion of the distal apical PDA. She had unsuccessful POBA. She was pain-free within the procedure. LVEDP was mildly elevated at 27. She is on Lasix and spironolactone. We'll continue to her medically. We will tentatively echocardiogram and cycling her enzymes.           Patient Profile     [name] is a 76 year old frail-appearing Caucasian female patient of Dr. Antionette Char with a history of hypertension, hyperlipidemia, COPD and ischemic cardiomyopathy. She had a circumflex is acute myocardial infarction 2010  and a stent placed at that time. She had a proximal LAD is placed by Dr. Martinique in December of last year. Her EF is in the 25% range. She developed chest pain 8:00 pm on 07/06/2017 and presented to the emergency room where her EKG showed inferior ST segment elevation. She was evaluated because of ongoing chest pain and was brought emergently to the Cath Lab for angiography and intervention.  Assessment & Plan    1. 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  Will MI while pt on plavix, will transition to [place] for more aggressive antiplatelet Rx.  Trop 0.78  2. Ischemic cardiomyopathy  EF 30 - 35%.  BNP 539.   With frequent PVC will add carvedilol 3.125 mg bid; try resuming ARB as BP allows with potential plan for future entresto. Change spironolactone to 12.5 mg bid.  Will repeat echo.  3. HLD: Last LDL 50;  Re-check;  Consider changing simva to atorvastatin or rosuvastatin.    4. COPD with hx of lung Ca.    Signed, Troy Sine, MD, [facility] 07/07/2017, 11:05 AM

## 2017-07-08 LAB — LIPID PANEL
Cholesterol: 129 mg/dL (ref 0–200)
HDL: 66 mg/dL (ref >40)
LDL CALC: 54 mg/dL (ref 0–99)
TRIGLYCERIDES: 47 mg/dL (ref <150)
Total CHOL/HDL Ratio: 2 RATIO
VLDL: 9 mg/dL (ref 0–40)

## 2017-07-08 LAB — CBC
HEMATOCRIT: 37 % (ref 36.0–46.0)
Hemoglobin: 12.6 g/dL (ref 12.0–15.0)
MCH: 31.7 pg (ref 26.0–34.0)
MCHC: 34.1 g/dL (ref 30.0–36.0)
MCV: 93.2 fL (ref 78.0–100.0)
Platelets: 240 10*3/uL (ref 150–400)
RBC: 3.97 MIL/uL (ref 3.87–5.11)
RDW: 13.5 % (ref 11.5–15.5)
WBC: 8.8 10*3/uL (ref 4.0–10.5)

## 2017-07-08 LAB — HEPARIN LEVEL (UNFRACTIONATED): HEPARIN UNFRACTIONATED: 0.36 [IU]/mL (ref 0.30–0.70)

## 2017-07-08 MED ORDER — FUROSEMIDE 20 MG PO TABS
20.0000 mg | ORAL_TABLET | Freq: Every day | ORAL | Status: DC
  Administered 2017-07-09: 20 mg via ORAL
  Filled 2017-07-08: qty 1

## 2017-07-08 MED ORDER — IPRATROPIUM-ALBUTEROL 0.5-2.5 (3) MG/3ML IN SOLN
3.0000 mL | Freq: Four times a day (QID) | RESPIRATORY_TRACT | Status: DC
  Administered 2017-07-08 – 2017-07-09 (×8): 3 mL via RESPIRATORY_TRACT
  Filled 2017-07-08 (×8): qty 3

## 2017-07-08 NOTE — Progress Notes (Signed)
CARDIAC REHAB PHASE I   PRE:  Rate/Rhythm: 70 SR  BP:  Supine:   Sitting: 97/69  Standing:    SaO2: 96%RA  MODE:  Ambulation: 60 ft   POST:  Rate/Rhythm: 88 SR PVCs  BP:  Supine:   Sitting: 84/66  Standing:    SaO2: 98%RA 1100-1132 Pt walked 60 ft on RA with gait belt use, rolling walker and asst x2 with slow steady gait and no c/o CP or dizziness. Pt has read heart healthy diet. Discussed brilinta and importance of taking. Gave TV and guide to watch ed videos. To recliner with call bell. Did well on RA.    Graylon Good, RN BSN  07/08/2017 11:28 AM

## 2017-07-08 NOTE — Progress Notes (Signed)
ANTICOAGULATION CONSULT NOTE - Follow Up Consult  Pharmacy Consult for heparin Indication: STEMI  Labs: Recent Labs    07/06/17 2336 07/06/17 2342 07/07/17 0228 07/07/17 0325 07/07/17 1242 07/07/17 1628 07/08/17 0350  HGB 14.5 14.6  --  13.2  --   --  12.6  HCT 41.8 43.0  --  39.1  --   --  37.0  PLT 287  --   --  253  --   --  240  LABPROT 12.9  --   --   --   --   --   --   INR 0.98  --   --   --   --   --   --   HEPARINUNFRC  --   --   --   --   --  0.22* 0.36  CREATININE 1.07* 0.90  --  0.85  --   --   --   TROPONINI  --   --  0.65* 0.78* 3.71*  --   --     Assessment/Plan:  76yo female therapeutic on heparin after rate change. Will continue gtt at current rate until off after 24h of therapy.   Wynona Neat, PharmD, BCPS  07/08/2017,5:13 AM

## 2017-07-08 NOTE — Progress Notes (Signed)
Progress Note  Patient Name: [name] Date of Encounter: 07/08/2017  Primary Cardiologist: Burt Knack  Subjective   No chest pain  Inpatient Medications    Scheduled Meds: . aspirin EC  81 mg Oral Daily  . carvedilol  3.125 mg Oral BID WC  . cholecalciferol  2,000 Units Oral Daily  . diphenhydrAMINE  25 mg Intravenous Once  . furosemide  40 mg Oral Daily  . ipratropium-albuterol  3 mL Nebulization QID  . isosorbide mononitrate  30 mg Oral Daily  . loteprednol  1 drop Both Eyes Daily  . mouth rinse  15 mL Mouth Rinse BID  . pantoprazole  40 mg Oral Daily  . simvastatin  40 mg Oral QHS  . sodium chloride flush  3 mL Intravenous Q12H  . spironolactone  12.5 mg Oral BID  . ticagrelor  90 mg Oral BID   Continuous Infusions: . sodium chloride    . sodium chloride     PRN Meds: sodium chloride, acetaminophen, albuterol, nitroGLYCERIN, ondansetron (ZOFRAN) IV, sodium chloride flush   Vital Signs    Vitals:   07/08/17 0846 07/08/17 0900 07/08/17 0910 07/08/17 1203  BP: 103/72 (!) 119/99    Pulse: 85 83    Resp: 20 19    Temp: 98.7 F (37.1 C)     TempSrc: Oral     SpO2: 96% 97% 94% 95%  Weight:      Height:        Intake/Output Summary (Last 24 hours) at 07/08/2017 1211 Last data filed at 07/08/2017 0800 Gross per 24 hour  Intake 163.95 ml  Output 900 ml  Net -736.05 ml    I/O since admission:  -201  Filed Weights   07/06/17 2327 07/07/17 0200 07/08/17 0450  Weight: 125 lb (56.7 kg) 124 lb 12.5 oz (56.6 kg) 127 lb 10.3 oz (57.9 kg)    Telemetry    Sinus 89 - Personally Reviewed  ECG    ECG (independently read by me): NSR at 76 with PVCs, LBBB  Physical Exam   BP (!) 119/99   Pulse 83   Temp 98.7 F (37.1 C) (Oral)   Resp 19   Ht 5\' 6"  (1.676 m)   Wt 127 lb 10.3 oz (57.9 kg)   SpO2 95%   BMI 20.60 kg/m  General: Alert, oriented, no distress.  Skin: normal turgor, no rashes, warm and dry HEENT: Normocephalic, atraumatic. Pupils equal  round and reactive to light; sclera anicteric; extraocular muscles intact;  Nose without nasal septal hypertrophy Mouth/Parynx benign; Mallinpatti scale 2 Neck: No JVD, no carotid bruits; normal carotid upstroke Lungs: clear to ausculatation and percussion; no wheezing or rales Chest wall: without tenderness to palpitation Heart: PMI not displaced, RRR, s1 s2 normal, 1/6 systolic murmur, no diastolic murmur, no rubs, gallops, thrills, or heaves Abdomen: soft, nontender; no hepatosplenomehaly, BS+; abdominal aorta nontender and not dilated by palpation. Back: no CVA tenderness Pulses 2+ Musculoskeletal: full range of motion, normal strength, no joint deformities Extremities: no clubbing cyanosis or edema, Homan's sign negative  Neurologic: grossly nonfocal; Cranial nerves grossly wnl Psychologic: Normal mood and affect   Labs    Chemistry Recent Labs  Lab 07/06/17 2336 07/06/17 2342 07/07/17 0325  NA 134* 137 133*  K 4.3 4.3 4.5  CL 101 103 104  CO2 24  --  22  GLUCOSE 188* 191* 132*  BUN 22* 22* 20  CREATININE 1.07* 0.90 0.85  CALCIUM 8.8*  --  8.2*  GFRNONAA  49*  --  >60  GFRAA 57*  --  >60  ANIONGAP 9  --  7     Hematology Recent Labs  Lab 07/06/17 2336 07/06/17 2342 07/07/17 0325 07/08/17 0350  WBC 9.7  --  7.9 8.8  RBC 4.47  --  4.20 3.97  HGB 14.5 14.6 13.2 12.6  HCT 41.8 43.0 39.1 37.0  MCV 93.5  --  93.1 93.2  MCH 32.4  --  31.4 31.7  MCHC 34.7  --  33.8 34.1  RDW 13.5  --  13.5 13.5  PLT 287  --  253 240    Cardiac Enzymes Recent Labs  Lab 07/07/17 0228 07/07/17 0325 07/07/17 1242  TROPONINI 0.65* 0.78* 3.71*    Recent Labs  Lab 07/06/17 2340  TROPIPOC 0.08     BNP Recent Labs  Lab 07/06/17 2336  BNP 539.4*     DDimer No results for input(s): DDIMER in the last 168 hours.   Lipid Panel     Component Value Date/Time   CHOL 129 07/08/2017 0350   TRIG 47 07/08/2017 0350   HDL 66 07/08/2017 0350   CHOLHDL 2.0 07/08/2017 0350    VLDL 9 07/08/2017 0350   LDLCALC 54 07/08/2017 0350   LDLDIRECT 50.0 11/25/2016 1131    Radiology    Dg Chest Port 1 View  Result Date: 07/07/2017 CLINICAL DATA:  Acute onset of shortness of breath. EXAM: PORTABLE CHEST 1 VIEW COMPARISON:  Chest radiograph performed 05/03/2017, and CT of the chest performed 07/02/2017 FINDINGS: The lungs are well-aerated. Vascular congestion is noted. Increased interstitial markings raise concern for pulmonary edema. There is no evidence of pleural effusion or pneumothorax. Known pulmonary nodules are not well characterized on radiograph. The cardiomediastinal silhouette is borderline enlarged. No acute osseous abnormalities are seen. IMPRESSION: Vascular congestion and borderline cardiomegaly. Increased interstitial markings raise concern for pulmonary edema. Electronically Signed   By: Garald Balding M.D.   On: 07/07/2017 02:38    Cardiac Studies   Conclusion     Dist LAD lesion is 100% stenosed.  Ost 1st Diag lesion is 80% stenosed.  Previously placed Prox Cx to Mid Cx stent (unknown type) is widely patent.  RPDA lesion is 100% stenosed.  Prox LAD-1 lesion is 40% stenosed.  Previously placed Prox LAD-2 drug eluting stent is widely patent.  Balloon angioplasty was performed.  Post intervention, there is a 100% residual stenosis.   IMPRESSION: Acute inferior STEMI related to occlusion of the distal apical PDA. She had unsuccessful POBA. She was pain-free within the procedure. LVEDP was mildly elevated at 27. She is on Lasix and spironolactone. We'll continue to her medically. We will tentatively echocardiogram and cycling her enzymes.         ------------------------------------------------------------------- ECHO Study Conclusions  - Left ventricle: The cavity size was mildly dilated. Wall   thickness was normal. Systolic function was moderately to   severely reduced. The estimated ejection fraction was in the   range of 30% to 35%.  Global hypokinesis with severe inferior wall   hypokinesis to akinesis. The study is not technically sufficient   to allow evaluation of LV diastolic function. - Aortic valve: Poorly visualized. There was no stenosis. Trace to   mild regurgitation. - Mitral valve: Mildly thickened leaflets . Dilated annulus with   posterior leaflet tethering. Mild to moderate regurgitation. - Left atrium: Moderately dilated. - Tricuspid valve: There was moderate regurgitation. - Pulmonary arteries: PA peak pressure: 40 mm Hg (S). - Inferior  vena cava: The vessel was dilated. The respirophasic   diameter changes were blunted (< 50%), consistent with elevated   central venous pressure.  Impressions:  - Compared to a prior study in 06/2016, there are no significant   changes.   Patient Profile     Ms. Chay is a 76 year old frail-appearing Caucasian female patient of Dr. Antionette Char with a history of hypertension, hyperlipidemia, COPD and ischemic cardiomyopathy. She had a circumflex is acute myocardial infarction 2010 and a stent placed at that time. She had a proximal LAD is placed by Dr. [place] in December of last year. Her EF is in the 25% range. She developed chest pain 8:00 pm on 07/06/2017 and presented to the emergency room where her EKG showed inferior ST segment elevation. She was evaluated because of ongoing chest pain and was brought emergently to the Cath Lab for angiography and intervention.  Assessment & Plan    1. Day 2 s/p Inferior STEMI 2/2/ PDA occlusion with unsuccessful POBA; old distal LAD occlusion.  Had MI while pt on plavix, therefore,  transitioned to [place] for more aggressive antiplatelet Rx.  Trop 0.78 >> 3.71.  2. Ischemic cardiomyopathy  EF 30 - 35%.  BNP 539.   With frequent PVC  carvedilol 3.125 mg bid was added yesterday;  Occasional PVCs persist.  BP is low precluding increase or initiation of ARB today, but should resume when BP allows with potential plan for future  entresto. On spironolactone  12.5 mg bid. Will decrease lasix to 20 mg.  No significant change in echo c/w 12/17 study.  3. HLD: Last LDL 50;  Re-check 62 yesterday.     4. COPD with hx of lung Ca.    Signed, Troy Sine, MD, [facility] 07/08/2017, 12:11 PM

## 2017-07-09 LAB — BASIC METABOLIC PANEL
ANION GAP: 9 (ref 5–15)
BUN: 15 mg/dL (ref 6–20)
CHLORIDE: 104 mmol/L (ref 101–111)
CO2: 23 mmol/L (ref 22–32)
CREATININE: 0.98 mg/dL (ref 0.44–1.00)
Calcium: 8.2 mg/dL — ABNORMAL LOW (ref 8.9–10.3)
GFR calc non Af Amer: 55 mL/min — ABNORMAL LOW (ref >60)
Glucose, Bld: 104 mg/dL — ABNORMAL HIGH (ref 65–99)
Potassium: 3.5 mmol/L (ref 3.5–5.1)
SODIUM: 136 mmol/L (ref 135–145)

## 2017-07-09 LAB — GLUCOSE, CAPILLARY: GLUCOSE-CAPILLARY: 99 mg/dL (ref 65–99)

## 2017-07-09 LAB — BRAIN NATRIURETIC PEPTIDE: B Natriuretic Peptide: 460 pg/mL — ABNORMAL HIGH (ref 0.0–100.0)

## 2017-07-09 MED ORDER — SPIRONOLACTONE 25 MG PO TABS: 25.0000 mg | ORAL_TABLET | Freq: Every day | ORAL | 3 refills | Status: DC

## 2017-07-09 MED ORDER — TICAGRELOR 90 MG PO TABS: 90.0000 mg | ORAL_TABLET | Freq: Two times a day (BID) | ORAL | 11 refills | Status: DC

## 2017-07-09 MED ORDER — FUROSEMIDE 20 MG PO TABS: 20.0000 mg | ORAL_TABLET | Freq: Every day | ORAL | 11 refills | Status: DC

## 2017-07-09 MED ORDER — CARVEDILOL 3.125 MG PO TABS: 3.1250 mg | ORAL_TABLET | Freq: Two times a day (BID) | ORAL | 6 refills | Status: DC

## 2017-07-09 MED ORDER — TICAGRELOR 90 MG PO TABS: 90.0000 mg | ORAL_TABLET | Freq: Two times a day (BID) | ORAL | 0 refills | Status: DC

## 2017-07-09 NOTE — Plan of Care (Signed)
Pt. Able to ambulate to restroom with standby assistance. Bed alarm on. Pt. Uses call light to request assistance.

## 2017-07-09 NOTE — Discharge Summary (Signed)
Discharge Summary    Patient ID: [name],  MRN: [id number], DOB/AGE: 1941/05/26 76 y.o.  Admit date: 07/06/2017 Discharge date: 07/09/2017  Primary Care Provider: Ria Bush Primary Cardiologist: Dr. Burt Knack  Discharge Diagnoses    Active Problems:   STEMI (ST elevation myocardial infarction) [facility])   Acute myocardial infarction [facility])   History of Present Illness     76 yo female with PMH of CAD (s/p BMS to LCx in 2010, DES to mid-LAD in 06/2016 with medical treatment of known distal LAD stenosis), HTN, HLD, COPD, small cell lung cancer (s/p resection), and prior CVA who presented to [facility] ED on 07/06/2017 for evaluation of chest pain, found to have a STEMI.   Patient was doing well from a cardiac standpoint until 3-4 weeks ago when she noted intermittent dizziness and bradycardia (HR 37-49). On 12/11 in the evening, patient noted central, squeezing chest pain radiating to left arm associated with difficulty breathing, therefore EMS was activated and patient was transported to the ED. CP persisted upon arrival and EKG showed ST elevation and was transported to the cath lab for emergent catheterization.     Hospital Course     Consultants: None   1. Inferior STEMI/CAD - patient has known CAD having undergone stenting of LCx and mid-LAD in the past. Admitted with recurrent chest pain and EKG showing ST Elevation. Catheterization showed known 100% distal-LAD occlusion with patent LCx stent and Prox LAD stent. Was noted to have 100% stenosis of the PDA with unsuccessful POBA. Pain resolved during procedure.  - troponin peaked at 3.71. Echo shows known reduced EF of 30-35% as outlined above.  - continue ASA, BB, and statin therapy.  - Started on Brilinta to replace plavix  2. Ischemic cardiomyopathy - EF 30-35% by echocardiogram in 06/2017.  - she has been started on Coreg 3.125mg  BID and Spironolactone 12.5mg  BID (discharged on 25mg  daily). Unable to add  ACE-I/ARB secondary to hypotension.  - has been switched to PO Lasix 20mg  daily and kidney function remains stable at 0.98.  3. HLD - Lipid Panel this admission shows total cholesterol of 129, HDL 66, and LDL 54. At goal of LDL < 70. - continue Simvastatin 40mg  daily.   4. COPD/ History of Lung Cancer - has been continued on her PTA medication regimen.     _____________  Discharge Vitals Blood pressure (!) 106/50, pulse 72, temperature (!) 97.5 F (36.4 C), temperature source Oral, resp. rate 18, height 5\' 6"  (1.676 m), weight 122 lb 5.7 oz (55.5 kg), SpO2 94 %.  Filed Weights   07/08/17 0450 07/08/17 1821 07/09/17 0438  Weight: 127 lb 10.3 oz (57.9 kg) 123 lb (55.8 kg) 122 lb 5.7 oz (55.5 kg)    Labs & Radiologic Studies     CBC Recent Labs    07/06/17 2336  07/07/17 0325 07/08/17 0350  WBC 9.7  --  7.9 8.8  NEUTROABS 5.6  --   --   --   HGB 14.5   < > 13.2 12.6  HCT 41.8   < > 39.1 37.0  MCV 93.5  --  93.1 93.2  PLT 287  --  253 240   < > = values in this interval not displayed.   Basic Metabolic Panel Recent Labs    07/07/17 0325 07/09/17 0604  NA 133* 136  K 4.5 3.5  CL 104 104  CO2 22 23  GLUCOSE 132* 104*  BUN 20 15  CREATININE  0.85 0.98  CALCIUM 8.2* 8.2*   Liver Function Tests No results for input(s): AST, ALT, ALKPHOS, BILITOT, PROT, ALBUMIN in the last 72 hours. No results for input(s): LIPASE, AMYLASE in the last 72 hours. Cardiac Enzymes Recent Labs    07/07/17 0228 07/07/17 0325 07/07/17 1242  TROPONINI 0.65* 0.78* 3.71*   BNP Invalid input(s): POCBNP D-Dimer No results for input(s): DDIMER in the last 72 hours. Hemoglobin A1C No results for input(s): HGBA1C in the last 72 hours. Fasting Lipid Panel Recent Labs    07/08/17 0350  CHOL 129  HDL 66  LDLCALC 54  TRIG 47  CHOLHDL 2.0   Thyroid Function Tests No results for input(s): TSH, T4TOTAL, T3FREE, THYROIDAB in the last 72 hours.  Invalid input(s): FREET3  Ct Chest  Wo Contrast  Result Date: 07/02/2017 CLINICAL DATA:  Followup bilateral pulmonary nodules. Previous right lung carcinoma. Former smoker. EXAM: CT CHEST WITHOUT CONTRAST TECHNIQUE: Multidetector CT imaging of the chest was performed following the standard protocol without IV contrast. COMPARISON:  CT on 11/30/2016 FINDINGS: Cardiovascular: No acute findings. Aortic and coronary artery atherosclerosis. Mediastinum/Nodes: No masses or pathologically enlarged lymph nodes identified on this unenhanced exam. Lungs/Pleura: Mild to moderate emphysema. Spiculated nodule in the posterior right lower lobe abutting the pleura currently measures 2.8 x 1.3 cm on image 113/3, compared to 2.5 x 1.0 cm on 11/30/2016 and 2.1 x 1.0 cm on 06/22/2016. Spiculated nodule in the posterosuperior left lower lobe measures 1.9 x 1.4 cm on image 62/3, which is not significantly changed compared to most recent exam but has increased in size compared to previous studies dating back to 2017. Both of these are suspicious for low-grade adenocarcinoma. New peribronchovascular nodularity in the right lower lobe is most likely infectious or inflammatory in etiology. Upper Abdomen:  Unremarkable. Musculoskeletal:  No suspicious bone lesions. IMPRESSION: Mild interval enlargement of spiculated pulmonary nodules in both lower lobes compared to previous studies dating back to 2017. These are suspicious for low-grade adenocarcinoma. Consider PET-CT and/or percutaneous needle biopsy. New peribronchovascular nodularity in right lower lobe, most likely infectious or inflammatory in etiology. Continued imaging follow-up recommended. No evidence of lymphadenopathy or pleural effusion. Aortic Atherosclerosis (ICD10-I70.0) and Emphysema (ICD10-J43.9). Coronary artery calcification. Electronically Signed   By: Earle Gell M.D.   On: 07/02/2017 11:11   Dg Chest Port 1 View  Result Date: 07/07/2017 CLINICAL DATA:  Acute onset of shortness of breath. EXAM:  PORTABLE CHEST 1 VIEW COMPARISON:  Chest radiograph performed 05/03/2017, and CT of the chest performed 07/02/2017 FINDINGS: The lungs are well-aerated. Vascular congestion is noted. Increased interstitial markings raise concern for pulmonary edema. There is no evidence of pleural effusion or pneumothorax. Known pulmonary nodules are not well characterized on radiograph. The cardiomediastinal silhouette is borderline enlarged. No acute osseous abnormalities are seen. IMPRESSION: Vascular congestion and borderline cardiomegaly. Increased interstitial markings raise concern for pulmonary edema. Electronically Signed   By: Garald Balding M.D.   On: 07/07/2017 02:38     Diagnostic Studies/Procedures     Cardiac Catheterization: 07/06/2017 IMPRESSION: Acute inferior STEMI related to occlusion of the distal apical PDA. She had unsuccessful POBA. She was pain-free within the procedure. LVEDP was mildly elevated at 27. She is on Lasix and spironolactone. We'll continue to her medically. We will tentatively echocardiogram and cycling her enzymes. If next we'll continue to the back issues. The sheath will be removed and pressure held.  Echocardiogram: 07/07/2017 Study Conclusions  - Left ventricle: The cavity size was mildly  dilated. Wall   thickness was normal. Systolic function was moderately to   severely reduced. The estimated ejection fraction was in the   range of 30% to 35%. Global hypokinesis with severe inferior wall   hypokinesis to akinesis. The study is not technically sufficient   to allow evaluation of LV diastolic function. - Aortic valve: Poorly visualized. There was no stenosis. Trace to   mild regurgitation. - Mitral valve: Mildly thickened leaflets . Dilated annulus with   posterior leaflet tethering. Mild to moderate regurgitation. - Left atrium: Moderately dilated. - Tricuspid valve: There was moderate regurgitation. - Pulmonary arteries: PA peak pressure: 40 mm Hg (S). -  Inferior vena cava: The vessel was dilated. The respirophasic   diameter changes were blunted (< 50%), consistent with elevated   central venous pressure.  Impressions:  - Compared to a prior study in 06/2016, there are no significant   changes. _____________    Disposition   Pt is being discharged home today in good condition.  Follow-up Plans & Appointments    Follow-up Information    Sherren Mocha, MD Follow up on 07/30/2017.   Specialty:  Cardiology Why:  Please follow up with Dr. Burt Knack on 07/30/17 at 10am. Contact information: [address] [place] [address] [phone]        Ria Bush, MD. Go on 07/14/2017.   Specialty:  Family Medicine Why:  @13 :00pm Contact information: [place] [address] [phone]          Discharge Instructions    AMB Referral to Cardiac Rehabilitation - Phase II   Complete by:  As directed    Diagnosis:  STEMI   AMB Referral to [facility]   Complete by:  As directed    This is a [employer] member.  Score is 48% high risk agrees to telephonic nurse.  Please assign patient to pharmacy.  Patient has issues with affording anti-coags.  Started on Brilinta, had recently been in donut. Uses [employer].   Please assign to Telephonic nurse for short term  TOC.  Marland Kitchen Please assess for social worker on financial issues.   Questions please call:   Natividad Brood, RN BSN [facility] Liaison  [phone] business mobile phone Toll free office [phone]   Reason for consult:  STEMI, HX COPD, HTA member   Diagnoses of:   COPD/ Pneumonia Heart Failure     Expected date of contact:  1-3 days (reserved for hospital discharges)   Amb Referral to Cardiac Rehabilitation   Complete by:  As directed    Stated she has attended before and had difficulty being able to exercise. Prefers not to attend ex program again.   Diagnosis:  STEMI   Diet -  low sodium heart healthy   Complete by:  As directed    Discharge instructions   Complete by:  As directed    PLEASE REMEMBER TO BRING ALL OF YOUR MEDICATIONS TO EACH OF YOUR FOLLOW-UP OFFICE VISITS.  PLEASE ATTEND ALL SCHEDULED FOLLOW-UP APPOINTMENTS.   Activity: Increase activity slowly as tolerated. You may shower, but no soaking baths (or swimming) for 1 week. You may not return to work until cleared by your cardiologist. No lifting over 10 lbs for 4 weeks. No sexual activity for 4 weeks.   Wound Care: You may wash cath site gently with soap and water. Keep cath site clean and dry. If you notice pain, swelling, bleeding or pus at  your cath site, please call [phone].   Increase activity slowly   Complete by:  As directed       Discharge Medications     Medication List    STOP taking these medications   clopidogrel 75 MG tablet Commonly known as:  PLAVIX     TAKE these medications   AEROCHAMBER MV inhaler Use as instructed   albuterol 108 (90 Base) MCG/ACT inhaler Commonly known as:  PROAIR HFA Inhale 2 puffs into the lungs every 4 (four) hours as needed for wheezing or shortness of breath.   albuterol (2.5 MG/3ML) 0.083% nebulizer solution Commonly known as:  PROVENTIL Take 3 mLs (2.5 mg total) by nebulization every 4 (four) hours as needed for wheezing or shortness of breath.   aspirin EC 81 MG tablet Take 1 tablet (81 mg total) by mouth daily.   calcium carbonate 1250 (500 Ca) MG tablet Commonly known as:  OS-CAL - dosed in mg of elemental calcium Take 1 tablet by mouth 2 (two) times daily with a meal.   carvedilol 3.125 MG tablet Commonly known as:  COREG Take 1 tablet (3.125 mg total) by mouth 2 (two) times daily with a meal.   denosumab 60 MG/ML Soln injection Commonly known as:  PROLIA Inject 60 mg into the skin every 6 (six) months. Administer in upper arm, thigh, or abdomen   fluticasone 50 MCG/ACT nasal spray Commonly known as:  FLONASE INSTILL  TWO SPRAYS INTO BOTH NOSTRILS DAILY.   furosemide 20 MG tablet Commonly known as:  LASIX Take 1 tablet (20 mg total) by mouth daily. Take an additional 1/2 tablet daily for swelling. What changed:    medication strength  how much to take   ipratropium-albuterol 0.5-2.5 (3) MG/3ML Soln Commonly known as:  DUONEB Take 3 mLs by nebulization every 4 (four) hours as needed (wheezing or shortness of breath).   isosorbide mononitrate 30 MG 24 hr tablet Commonly known as:  IMDUR TAKE 1 TABLET BY MOUTH DAILY   loratadine 10 MG tablet Commonly known as:  CLARITIN Take 10 mg by mouth daily as needed for allergies or rhinitis.   LOTEMAX 0.5 % ophthalmic suspension Generic drug:  loteprednol Place 1 drop into both eyes daily.   nitroGLYCERIN 0.4 MG SL tablet Commonly known as:  NITROSTAT Place 1 tablet (0.4 mg total) under the tongue every 5 (five) minutes as needed for chest pain.   pantoprazole 40 MG tablet Commonly known as:  PROTONIX Take 1 tablet (40 mg total) by mouth daily.   simvastatin 40 MG tablet Commonly known as:  ZOCOR Take 1 tablet (40 mg total) by mouth at bedtime.   spironolactone 25 MG tablet Commonly known as:  ALDACTONE Take 1 tablet (25 mg total) by mouth daily. What changed:  how much to take   ticagrelor 90 MG Tabs tablet Commonly known as:  BRILINTA Take 1 tablet (90 mg total) by mouth 2 (two) times daily.       Aspirin prescribed at discharge?  Yes High Intensity Statin Prescribed? (Lipitor 40-80mg  or Crestor 20-40mg ): No: at goal with simvastatin Beta Blocker Prescribed? Yes For EF 40% or less, Was ACEI/ARB Prescribed? No: blood pressure would not allow ADP Receptor Inhibitor Prescribed? (i.e. Plavix etc.-Includes Medically Managed Patients): Yes For EF <40%, Aldosterone Inhibitor Prescribed? Yes Was EF assessed during THIS hospitalization? Yes Was Cardiac Rehab II ordered? (Included Medically managed Patients): Yes   Allergies Allergies    Allergen Reactions  . Actonel [Risedronate Sodium] Other (See  Comments)    Headache  . Amlodipine Other (See Comments)    Pedal edema  . Fosamax [Alendronate Sodium] Other (See Comments)    Unable to tolerate  . Lisinopril Cough  . Pravastatin Other (See Comments)    Constipation.  . Codeine Rash     Outstanding Labs/Studies   None  Duration of Discharge Encounter   Greater than 30 minutes including physician time.  Signed, Abigail Butts, PA-C 07/09/2017, 3:47 PM

## 2017-07-09 NOTE — Progress Notes (Signed)
CARDIAC REHAB PHASE I   PRE:  Rate/Rhythm: 72 SR few PVCs  BP:  Supine:   Sitting: 113/64  Standing:    SaO2: 97%RA  MODE:  Ambulation: 375 ft   POST:  Rate/Rhythm: 76  BP:  Supine:   Sitting: 123/64  Standing:    SaO2: 100%RA 1050-1120 Pt walked 375 ft on RA with rolling walker, stopping twice to rest due to SOB. Tolerated well. To recliner after walk. No CP. Pt knows to weigh every day and when to call MD with weight gain. Watches her sodium. Encouraged walking as tolerated. Limited by COPD.   Graylon Good, RN BSN  07/09/2017 11:16 AM

## 2017-07-09 NOTE — Consult Note (Signed)
   [facility] CM Inpatient Consult   07/09/2017  [name] [birth year] [id number]  Patient previously with a H with [facility].  Met with the patient at the bedside.  Patient with a HX of COPD, new STEMI to start on Brilinta.  Met with the patient at bedside.  Patient states she is doing alright.  Hewr brother is still her contact person.  Consent on file.  She states she has issues with affording her medicines.  She states she is managing and found out that the Brilinta will cost her $45.00 co-pay but she felt that it was better than the Lovenox cost.  She lives alone but declines home visits at this time.  She would like post hospital follow up with the Telephonic nurse since she has had a "heart attack".  Explained that [facility] does not interfere with or replace any services needed and arranged by the inpatient RNCM.  Will have [facility] follow up for transition of care at home.  She endorses Dr. Garlon Hatchet, Danise Mina, [facility].  Will follow up with this office of [facility].   For questions,  Natividad Brood, RN BSN [facility] Liaison  [phone] business mobile phone Toll free office [phone]

## 2017-07-09 NOTE — Progress Notes (Signed)
Progress Note  Patient Name: [name] Date of Encounter: 07/09/2017  Primary Cardiologist: Dr. Burt Knack  Subjective   Ambulated over 300 ft with cardiac rehab this morning without any chest discomfort. Has baseline dyspnea on exertion.   Inpatient Medications    Scheduled Meds: . aspirin EC  81 mg Oral Daily  . carvedilol  3.125 mg Oral BID WC  . cholecalciferol  2,000 Units Oral Daily  . diphenhydrAMINE  25 mg Intravenous Once  . furosemide  20 mg Oral Daily  . ipratropium-albuterol  3 mL Nebulization QID  . isosorbide mononitrate  30 mg Oral Daily  . loteprednol  1 drop Both Eyes Daily  . mouth rinse  15 mL Mouth Rinse BID  . pantoprazole  40 mg Oral Daily  . simvastatin  40 mg Oral QHS  . sodium chloride flush  3 mL Intravenous Q12H  . spironolactone  12.5 mg Oral BID  . ticagrelor  90 mg Oral BID   Continuous Infusions: . sodium chloride    . sodium chloride     PRN Meds: sodium chloride, acetaminophen, albuterol, nitroGLYCERIN, ondansetron (ZOFRAN) IV, sodium chloride flush   Vital Signs    Vitals:   07/09/17 0438 07/09/17 0726 07/09/17 0819 07/09/17 1120  BP: (!) 107/57  101/62   Pulse: 78  86   Resp: 18     Temp: 98.2 F (36.8 C)     TempSrc: Oral     SpO2: 90% 98%  95%  Weight: 122 lb 5.7 oz (55.5 kg)     Height:        Intake/Output Summary (Last 24 hours) at 07/09/2017 1245 Last data filed at 07/09/2017 1013 Gross per 24 hour  Intake 920 ml  Output 300 ml  Net 620 ml   Filed Weights   07/08/17 0450 07/08/17 1821 07/09/17 0438  Weight: 127 lb 10.3 oz (57.9 kg) 123 lb (55.8 kg) 122 lb 5.7 oz (55.5 kg)    Telemetry    NSR, HR in 60's - 80's. 4 beats NSVT.  - Personally Reviewed  ECG    No new tracings.   Physical Exam   General: Well developed, well nourished Caucasian female appearing in no acute distress. Head: Normocephalic, atraumatic.  Neck: Supple without bruits, JVD not elevated. Lungs:  Resp regular and unlabored, CTA  without wheezing or rales. Heart: RRR, S1, S2, no S3, S4, or murmur; no rub. Abdomen: Soft, non-tender, non-distended with normoactive bowel sounds. No hepatomegaly. No rebound/guarding. No obvious abdominal masses. Extremities: No clubbing, cyanosis, or edema. Distal pedal pulses are 2+ bilaterally. Neuro: Alert and oriented X 3. Moves all extremities spontaneously. Psych: Normal affect.  Labs    Chemistry Recent Labs  Lab 07/06/17 2336 07/06/17 2342 07/07/17 0325 07/09/17 0604  NA 134* 137 133* 136  K 4.3 4.3 4.5 3.5  CL 101 103 104 104  CO2 24  --  22 23  GLUCOSE 188* 191* 132* 104*  BUN 22* 22* 20 15  CREATININE 1.07* 0.90 0.85 0.98  CALCIUM 8.8*  --  8.2* 8.2*  GFRNONAA 49*  --  >60 55*  GFRAA 57*  --  >60 >60  ANIONGAP 9  --  7 9     Hematology Recent Labs  Lab 07/06/17 2336 07/06/17 2342 07/07/17 0325 07/08/17 0350  WBC 9.7  --  7.9 8.8  RBC 4.47  --  4.20 3.97  HGB 14.5 14.6 13.2 12.6  HCT 41.8 43.0 39.1 37.0  MCV 93.5  --  93.1 93.2  MCH 32.4  --  31.4 31.7  MCHC 34.7  --  33.8 34.1  RDW 13.5  --  13.5 13.5  PLT 287  --  253 240    Cardiac Enzymes Recent Labs  Lab 07/07/17 0228 07/07/17 0325 07/07/17 1242  TROPONINI 0.65* 0.78* 3.71*    Recent Labs  Lab 07/06/17 2340  TROPIPOC 0.08     BNP Recent Labs  Lab 07/06/17 2336 07/09/17 0604  BNP 539.4* 460.0*     DDimer No results for input(s): DDIMER in the last 168 hours.   Radiology    No results found.  Cardiac Studies   Cardiac Catheterization: 07/06/2017   Dist LAD lesion is 100% stenosed.  Ost 1st Diag lesion is 80% stenosed.  Previously placed Prox Cx to Mid Cx stent (unknown type) is widely patent.  RPDA lesion is 100% stenosed.  Prox LAD-1 lesion is 40% stenosed.  Previously placed Prox LAD-2 drug eluting stent is widely patent.  Balloon angioplasty was performed.  Post intervention, there is a 100% residual stenosis.  IMPRESSION: Acute inferior STEMI related  to occlusion of the distal apical PDA. She had unsuccessful POBA. She was pain-free within the procedure. LVEDP was mildly elevated at 27. She is on Lasix and spironolactone. We'll continue to her medically. We will tentatively echocardiogram and cycling her enzymes. If next we'll continue to the back issues. The sheath will be removed and pressure held.  Echocardiogram: 07/07/2017 Study Conclusions  - Left ventricle: The cavity size was mildly dilated. Wall   thickness was normal. Systolic function was moderately to   severely reduced. The estimated ejection fraction was in the   range of 30% to 35%. Global hypokinesis with severe inferior wall   hypokinesis to akinesis. The study is not technically sufficient   to allow evaluation of LV diastolic function. - Aortic valve: Poorly visualized. There was no stenosis. Trace to   mild regurgitation. - Mitral valve: Mildly thickened leaflets . Dilated annulus with   posterior leaflet tethering. Mild to moderate regurgitation. - Left atrium: Moderately dilated. - Tricuspid valve: There was moderate regurgitation. - Pulmonary arteries: PA peak pressure: 40 mm Hg (S). - Inferior vena cava: The vessel was dilated. The respirophasic   diameter changes were blunted (< 50%), consistent with elevated   central venous pressure.  Impressions:  - Compared to a prior study in 06/2016, there are no significant   changes.  Patient Profile     76 y.o. female w/ PMH of CAD (s/p BMS to LCx in 2010, DES to mid-LAD in 06/2016 with medical treatment of known distal LAD stenosis), HTN, HLD, COPD, small cell lung cancer (s/p resection), and prior CVA who presented to [facility] ED on 07/06/2017 for evaluation of chest pain, found to have a STEMI.    Assessment & Plan    1. Inferior STEMI/ CAD - patient has known CAD having undergone stenting of LCx and mid-LAD in the past. Admitted with recurrent chest pain and EKG showing ST elevation. Catheterization  showed known 100% distal-LAD occlusion with patent LCx stent and Prox LAD stent. Was noted to have 100% stenosis of the PDA with unsuccessful POBA. Pain resolved during procedure.  - troponin peaked at 3.71. Echo shows known reduced EF of 30-35% as outlined above.  - continue ASA, Brilinta (switched from Plavix), BB, and statin therapy.   2. Ischemic cardiomyopathy - EF 30-35% by echocardiogram in 06/2017.  - she has been started on Coreg 3.125mg  BID  and Spironolactone 12.5mg  BID. Unable to add ACE-I/ARB secondary to hypotension.  - has been switched to PO Lasix 20mg  daily and kidney function remains stable at 0.98.  3. HLD - Lipid Panel this admission shows total cholesterol of 129, HDL 66, and LDL 54. At goal of LDL < 70. - continue Simvastatin 40mg  daily.   4. COPD/ History of Lung Cancer - has been continued on her PTA medication regimen.    For questions or updates, please contact [place] Please consult [email or web address] for contact info under Cardiology/STEMI.   Arna Medici , PA-C 12:45 PM 07/09/2017 Pager: [phone]  Patient seen and examined. Agree with assessment and plan. No recurrent chest pain. On ASA/brilinta, coreg; wilth possible initiate ACE-I or ARB as outpatient depending on BP.  OK for dc today.  May need GI with some dyphagia and at times food getting stuck in throat.   Troy Sine, MD, [facility] 07/09/2017 1:52 PM

## 2017-07-12 ENCOUNTER — Telehealth: Payer: Self-pay | Admitting: Pharmacist

## 2017-07-12 ENCOUNTER — Telehealth: Payer: Self-pay | Admitting: *Deleted

## 2017-07-12 ENCOUNTER — Other Ambulatory Visit: Payer: Self-pay | Admitting: *Deleted

## 2017-07-12 ENCOUNTER — Telehealth: Payer: Self-pay | Admitting: Cardiovascular Disease

## 2017-07-12 NOTE — Telephone Encounter (Signed)
Patient complains of extreme fatigue and mild dizziness. When asked if she keeps a BP log, she said she'd be right back. She hung up the phone.  Attempted to call the patient several times but it was busy.  Will try again shortly.

## 2017-07-12 NOTE — Patient Outreach (Addendum)
[place] [facility]) Care Management  07/12/2017  Abbygail [place] Odonell [birth year] [id number]  Telephone Screen  Referral Date: 07/12/17 Referral Source: Nurse Call Center Referral Reason: Osvaldo Human states she had a heart attack Tuesday and went home Friday and has not had a BM in 5 days. She drank half a bottle of magnesium citrate and ate half a box of prunes this morning. She just had a small, hard BM. Insurance: HTA  Outreach attempt telephone call to patient and HIPAA identifiers verified. Patient reported, she had a "good BM last night around 7 pm". She stated, she feels better after having a BM. She was concerned due to this being the first time ever having problems with having a BM. Patient discussed how she eats foods high in fiber. RN CM and patient discussed narcotic/pain medications while in the hospital. Patient believes the narcotics may have caused her constipation. Patient verbalized having complications with her blood pressure. She reported systolic blood pressure readings 90's. She contacted the Cardiology office and spoke with the nurse regarding her blood pressure. Patient stated, the MD discontinued her Coreg, which was initiated during her hospital stay. Patient explained, she was given instructions to contact the Cardiologist if her blood pressure reading and symptoms don't improve. Patient has a scheduled follow-up discharge hospital appointment on 07/14/17 with her primary MD. She has a scheduled appointment with the Cardiologist on 07/30/17.   Plan: RN CM will close case. Pharmacy continues to assist patient with needs.  RN CM advised patient to alert MD for any changes in conditions.   Lake Bells, RN, BSN, MHA/MSL, [place] Telephonic Care Manager Coordinator [facility] Direct Phone: [phone] Toll Free: [phone] Fax: [phone]

## 2017-07-12 NOTE — Telephone Encounter (Signed)
Reviewed her medications.  While a beta-blocker is indicated after her MI, her blood pressure is clearly running too low.  She is fairly tenuous.  I am recommending that she hold her carvedilol until she can be assessed here in the office.  She should continue on her other medicines without change.

## 2017-07-12 NOTE — Telephone Encounter (Signed)
Spoke to pt and attempted to complete TCM call. Pt states she "was not up to it," and did not want to complete questionnaire. She also indicates she is too weak for a f/u on Wednesday and prefers to be seen after her cardiologist appt 07/30/17. Pt scheduled for 08/02/2017.

## 2017-07-12 NOTE — Telephone Encounter (Signed)
[name] reports extreme fatigue. She "can't hardly do a thing." She also reports dizziness, especially when going from a sitting to standing position. She denies CP and swelling. Her HR is "fine." She reports SOB but she states this is not a new problem, she's "had that for years." She is taking medications as directed. Her BP yesterday morning was 97/69. She got up and walked around and it increased to 109/71. Today, her BP was 92/67 and 99/68. She states her pressure is usually around 120/60. Instructed patient to move slowing, especially when moving to a standing position. She understands she will be called with Dr. Antionette Char recommendations.

## 2017-07-12 NOTE — Patient Outreach (Signed)
[place] [facility]) Care Management  07/12/2017  [name] 05-16-1941 [id number]  Transition of Care Referral  TOC will be completed by primary care provider's office, who will refer to [place] [facility], if need.  Plan: RN CM will complete a case closure.   Lake Bells, RN, BSN, MHA/MSL, [place] Telephonic Care Manager Coordinator [facility] Direct Phone: [phone] Cell Phone: [phone] Toll Free: [phone] Fax: [phone]: [phone]

## 2017-07-12 NOTE — Telephone Encounter (Signed)
Instructed patient to [name]. She will keep already scheduled appointment 1/4 and will call prior to that time if there is no symptom improvement. She was grateful for assistance.

## 2017-07-12 NOTE — Patient Outreach (Signed)
[place] [facility]) Care Management  [place]   07/12/2017  [name] 24-Feb-1941 [id number]  76 year old female referred to [place] [facility] by [facility] inpatient liaison Natividad Brood for high risk of re-admission and transition of care services.  [place] services requested for medication cost assistance with Brillinta.  PMHx includes, but not limited to, CAD, CHF (EF = 30-35% 12/'18), HTN, HLD, lung cancer s/p resection, chronic kidney disease stage III, COPD, TIA, GERD, osteoarthritis, and macular degeneration.  Noted recent hospitalization for STEMI s/p emergency cardiac catheterization with unsuccessful POBA.  Patient started on ASA, beta blocker, and statin with ticagrelor replacing clopidogrel, unable to add ACEi / ARB due to hypotension.    Carvedilol stopped earlier today by cardiology as patient complaining of extreme fatigue, dizziness, and hypotension.  Patient has f/u appointment with cardiology scheduled for 07/30/2016.   Subjective: Successful telephone call with [name] today. HIPAA identifiers verified. [name] is agreeable for [facility] pharmacy services.  She confirms that she would like medication assistance with ticagrelor (Brilinta), her new antiplatelet medication.  Patient reports she also uses loteprednol (Lotemax) suspension which costs $90-100 / month.  She is not sure she can afford both loteprednol and ticagrelor.  Patient uses [employer] [facility] in [place].  She currently has [employer] Medicare and will continue with this plan in 2019.  She received a 1 month supply of ticagrelor at no charge by using a manufacturer coupon card.  Her next fill will be due in 2019.    Objective:  07/09/17: SCr = 0.98 K = 3.5  07/08/17:  TC = 129 LDL = 54 TGs = 47  11/25/16:  Hemoglobin A1C = 6.1  Encounter Medications: Outpatient Encounter Medications as of 07/12/2017  Medication Sig  . albuterol (PROAIR HFA) 108 (90 Base) MCG/ACT inhaler  Inhale 2 puffs into the lungs every 4 (four) hours as needed for wheezing or shortness of breath.  Marland Kitchen albuterol (PROVENTIL) (2.5 MG/3ML) 0.083% nebulizer solution Take 3 mLs (2.5 mg total) by nebulization every 4 (four) hours as needed for wheezing or shortness of breath.  Marland Kitchen aspirin EC 81 MG tablet Take 1 tablet (81 mg total) by mouth daily.  . calcium carbonate (OS-CAL - DOSED IN MG OF ELEMENTAL CALCIUM) 1250 (500 Ca) MG tablet Take 1 tablet by mouth 2 (two) times daily with a meal.  . denosumab (PROLIA) 60 MG/ML SOLN injection Inject 60 mg into the skin every 6 (six) months. Administer in upper arm, thigh, or abdomen  . fluticasone (FLONASE) 50 MCG/ACT nasal spray INSTILL TWO SPRAYS INTO BOTH NOSTRILS DAILY.  . furosemide (LASIX) 20 MG tablet Take 1 tablet (20 mg total) by mouth daily. Take an additional 1/2 tablet daily for swelling.  Marland Kitchen ipratropium-albuterol (DUONEB) 0.5-2.5 (3) MG/3ML SOLN Take 3 mLs by nebulization every 4 (four) hours as needed (wheezing or shortness of breath).  . isosorbide mononitrate (IMDUR) 30 MG 24 hr tablet TAKE 1 TABLET BY MOUTH DAILY  . loratadine (CLARITIN) 10 MG tablet Take 10 mg by mouth daily as needed for allergies or rhinitis.   . LOTEMAX 0.5 % ophthalmic suspension Place 1 drop into both eyes daily.   . nitroGLYCERIN (NITROSTAT) 0.4 MG SL tablet Place 1 tablet (0.4 mg total) under the tongue every 5 (five) minutes as needed for chest pain.  . pantoprazole (PROTONIX) 40 MG tablet Take 1 tablet (40 mg total) by mouth daily.  . simvastatin (ZOCOR) 40 MG tablet Take 1 tablet (40 mg  total) by mouth at bedtime.  Marland Kitchen Spacer/Aero-Holding Chambers (AEROCHAMBER MV) inhaler Use as instructed  . spironolactone (ALDACTONE) 25 MG tablet Take 1 tablet (25 mg total) by mouth daily.  . ticagrelor (BRILINTA) 90 MG TABS tablet Take 1 tablet (90 mg total) by mouth 2 (two) times daily.   No facility-administered encounter medications on file as of 07/12/2017.     Functional  Status: In your present state of health, do you have any difficulty performing the following activities: 07/07/2017 01/05/2017  Hearing? N N  Vision? N N  Comment - -  Difficulty concentrating or making decisions? N N  Walking or climbing stairs? N Y  Dressing or bathing? N N  Doing errands, shopping? N -  Preparing Food and eating ? - -  Using the Toilet? - -  In the past six months, have you accidently leaked urine? - -  Do you have problems with loss of bowel control? - -  Managing your Medications? - -  Managing your Finances? - -  Housekeeping or managing your Housekeeping? - -  Some recent data might be hidden    Fall/Depression Screening: Fall Risk  11/25/2016 03/31/2016 03/09/2016  Falls in the past year? No No No  Risk for fall due to : - - Medication side effect   PHQ 2/9 Scores 11/25/2016 03/31/2016 03/09/2016 03/09/2016 01/08/2016 04/08/2015  PHQ - 2 Score 0 0 0 0 0 0  PHQ- 9 Score - - 0 - - -    ASSESSMENT: Date Discharged from Hospital: 07/09/2017 Date Medication Reconciliation Performed: 07/13/2017  Medications Discontinued at Discharge:   Clopidogrel  New Medications at Discharge:    Ticagrelor  Carvedilol    Patient was recently discharged from hospital and all medications have been reviewed  Drugs sorted by system:  Neurologic/Psychologic: none  Cardiovascular: aspirin, furosemide, isosorbide mononitrate, PRN NTG SL, simvastatin, spironolactone, ticagrelor  Pulmonary/Allergy: albuterol inhaler + nebulizers, ipratropium - albuterol nebulizers, fluticasone NS, loratadine  Gastrointestinal:pantoprazole  Endocrine: none  Renal: none  Topical:lotemax ophthalmic suspension  Pain:none  Vitamins/Minerals: calcium-carbonate  Infectious Diseases: none  Miscellaneous: denosumab  Duplications in therapy: Albuterol + ipratropium -albuterol nebulizers: patient counseled that both nebulizer products contain albuterol. Patient currently using plain albuterol  nebulizer solution QID and PRN albuterol inhaler.  Gaps in therapy:  History of heart failure with EF < 40%: No ACEi/ARB due to hypotension.  Carvedilol recently initiated and now stopped due to fatigue and hypotension by cardiology.  Patient has f/u appointment in 2 weeks.   Medications to avoid in the elderly: No issues Drug interactions: No issues  Medication assistance: Income: Patient estimates her monthly income is ~$1230 after premium is taken and she reports she has a small monthly pension ~$100.  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    Medications: Patient reports she is going to start a trial of prednisolone ophthalmic suspension and stop loteprednol as recommended by her optometrist and will follow-up with him in 3 months for evaluation.  She will be able to afford the ticagrelor while off loteprednol but is not sure if she resumes loteprednol that she can afford both.    Insurance:  Ticagrelor is a Tier 3 medication with HTA = $45 / month or $90 / 3 months.   Loteprednol is a Tier 4 medication with HTA = $90 / month or $180 / 3 months.   [facility] is listed as Oncologist with HTA.   Extra Help:  Patient may be eligible for Extra Help / LIS based on monthly income however not if she has property / resources > $14,100.    Patient Assistance Program:   [employer] PAP available for ticagrelor.  Patient meets income requirement but has to spend TROOP 3% of annual income.    [employer] PAP available for loteprednol - both brand names Lotemax or Alrex ([employer] products).  No TROOP.    I counseled patient on cost-savings for both ticagrelor (and loteprednol if ever resumed) through HTA by paying for 90 day supply rather than 30 day supply.  We reviewed eligibility criteria for Extra Help / LIS and PAP programs.  Patient will look for her financial documents to confirm her income and resources.    PLAN: I will follow-up with patient at  the end of next week unless I hear back from her beforehand regarding medication assistance.    Ralene Bathe, PharmD, [place] [phone]

## 2017-07-12 NOTE — Telephone Encounter (Signed)
Left message to call back  

## 2017-07-12 NOTE — Telephone Encounter (Signed)
New message   Pt verbalized that she is calling for the rn because she is weak   P did not report any smart phases issues

## 2017-07-14 ENCOUNTER — Ambulatory Visit: Payer: PPO | Admitting: Family Medicine

## 2017-07-21 ENCOUNTER — Ambulatory Visit: Payer: PPO

## 2017-07-26 ENCOUNTER — Ambulatory Visit: Payer: Self-pay | Admitting: Pharmacist

## 2017-07-26 ENCOUNTER — Other Ambulatory Visit: Payer: Self-pay | Admitting: Pharmacist

## 2017-07-26 NOTE — Patient Outreach (Signed)
[place] [facility]) Care Management  07/26/2017  Karmen Altamirano [place] [birth year] [id number]   Successful call this morning with Ms. Goodwyn to follow-up on medication assistance for Brillinta.  HIPAA identifiers verified.   Medication Assistance:   Ms. Loeper reports that she received a letter from social security with 2019 benefits information.  She will make $1403 / month and premium cost = $135.50.  She receives $117 / month from a pension.    -Total monthly income = $1520.50   Ms. Casares confirms her name is listed on the deed for a house in [place] which is worth ~$90,000.  She is therefore not eligible for LIS / Extra Help.    I reviewed her HTA co-pays in 2019 and PAP program requirements.  Patient eligible for PAP when she spends 3% of annual income = $540.  Patient expressed understanding.   Medication side effects:  Patient reports she is feeling constipated and her mouth is hurting since starting [place]oper.  She has a follow-up appointment with her cardiologist on Jan 4th and will discuss these symptoms with provider.  Per package insert, constipation is not listed as an ADE.   Plan: Patient will call me on Friday after her cardiology appointment to update on if she will stay on [place]. I will follow-up with her next week if I do not hear back on Friday.    Ralene Bathe, PharmD, [place] [phone]

## 2017-07-28 ENCOUNTER — Ambulatory Visit: Payer: PPO

## 2017-07-29 ENCOUNTER — Telehealth: Payer: Self-pay | Admitting: Emergency Medicine

## 2017-07-29 NOTE — Telephone Encounter (Signed)
Spoke with patient regarding results from CT scan from Dec 2018. Pt is requesting results and recommending from [place] please advise of patient's CT scan results last completed in Dec 2018. Routing message to [place] today

## 2017-07-30 ENCOUNTER — Encounter: Payer: Self-pay | Admitting: Cardiovascular Disease

## 2017-07-30 ENCOUNTER — Ambulatory Visit: Payer: PPO | Admitting: Cardiovascular Disease

## 2017-07-30 VITALS — BP 136/80 | HR 75 | Ht 66.0 in | Wt 121.8 lb

## 2017-07-30 DIAGNOSIS — E785 Hyperlipidemia, unspecified: Secondary | ICD-10-CM

## 2017-07-30 DIAGNOSIS — I5022 Chronic systolic (congestive) heart failure: Secondary | ICD-10-CM

## 2017-07-30 DIAGNOSIS — I10 Essential (primary) hypertension: Secondary | ICD-10-CM

## 2017-07-30 DIAGNOSIS — I2510 Atherosclerotic heart disease of native coronary artery without angina pectoris: Secondary | ICD-10-CM

## 2017-07-30 MED ORDER — CLOPIDOGREL BISULFATE 75 MG PO TABS: 75.0000 mg | ORAL_TABLET | Freq: Every day | ORAL | 11 refills | Status: DC

## 2017-07-30 NOTE — Telephone Encounter (Signed)
lmtcb x1 for pt. 

## 2017-07-30 NOTE — Telephone Encounter (Signed)
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  I believe we need to set up an office visit to review the films in detail and discuss our options to either follow or further investigate these nodules.  Please have her set up a visit

## 2017-07-30 NOTE — Patient Instructions (Addendum)
Medication Instructions:  1) STOP BRILINTA 2) START PLAVIX 75 mg daily. Take 2 tablets on day one (tomorrow) and then one tablet every day after that.  Labwork: None  Testing/Procedures: None  Follow-Up: Your provider recommends that you schedule a follow-up appointment in 1 months with Richardson Dopp, PA.  Any Other Special Instructions Will Be Listed Below (If Applicable).     If you need a refill on your cardiac medications before your next appointment, please call your pharmacy.

## 2017-07-30 NOTE — Progress Notes (Signed)
Cardiology Office Note Date:  07/30/2017   ID:  [name] Name, DOB 19-Nov-1940, MRN [id number]  PCP:  Ria Bush, MD  Cardiologist:  Sherren Mocha, MD    Chief Complaint  Patient presents with  . Hospitalization Follow-up    STEMI     History of Present Illness: [name] is a 77 y.o. female who presents for follow-up evaluation.  The patient has a complex history.  She initially presented with an acute MI in 2010 treated with bare-metal stenting of the left circumflex.  She has had recurrent non-STEMI in 2017 when she was found to have severe stenosis in the mid LAD treated with a drug-eluting stent.  She presented again last month with an inferior STEMI and was found to have total occlusion of the distal right PDA.  Attempts were made at aspiration and balloon angioplasty but flow was never able to be restored into the most distal part of the vessel.  There was no high-grade proximal stenosis identified.  She has had other problems including dizziness and bradycardia.  She has frequent PVCs.  She has chronic shortness of breath with a history of lung cancer and COPD.  Since discharge from the hospital about 3 weeks ago, her breathing has been really bad.  She thinks it is related to [place].  She was changed from Plavix to Brilinta during her hospitalization.  She has not had any recurrent chest pain.  She denies edema or heart palpitations.  She has chronic orthopnea.  She felt extremely fatigued and stopped taking carvedilol after receiving those instructions from our office when she called in.  She is feeling better since she has stopped this.  She still has problems with postural dizziness.  Notes that she is not drinking much water.   Past Medical History:  Diagnosis Date  . Arthritis   . CAD (coronary artery disease) 03/2009   s/p MI  . Chronic combined systolic and diastolic heart failure ([place]) 06/17/2009   Qualifier: Diagnosis of  By: Burt Knack, MD, [place] Pert   .  CKD (chronic kidney disease) stage 3, GFR 30-59 ml/min (HCC)   . Emphysema/COPD ([place])   . Ex-smoker quit 2008  . GERD (gastroesophageal reflux disease)   . HCAP (healthcare-associated pneumonia) 03/19/2016  . Helicobacter pylori gastritis 10/2007   treated  . History of CVA (cerebrovascular accident) without residual deficits 01/2012, 06/2012   R hemorrhagic MCA 01/2012 with remote lacunar infarct L putamen and IC, rpt 06/2012 acute multifocal R MCA infarct with remote hemorrhagic strokes affecting L basal ganglia and periventricular white matter, full recovery  . HLD (hyperlipidemia)   . HTN (hypertension)   . Ischemic cardiomyopathy 02/27/2014  . Lung cancer ([place]) dx'd 07/2006   Lung CA, s/p resection, followed by Dr Earlie Server  . Macular degeneration   . Myocardial infarction [facility]) 03/2009   Acute myocardial infarction 2010 - treated with BMS of LCx. LVEF 50% with subsequent CHF  . Osteoporosis 09/2013   T -3.6 forearm 09/2013, T -4.5 forearm 04/2015  . Stroke [facility])     Past Surgical History:  Procedure Laterality Date  . BREAST BIOPSY Left   . CARDIAC CATHETERIZATION N/A 02/24/2016   Procedure: Left Heart Cath and Coronary Angiography;  Surgeon: Belva Crome, MD;  Location: [place] CV LAB;  Service: Cardiovascular;  Laterality: N/A;  . CARDIAC CATHETERIZATION N/A 07/09/2016   Procedure: Left Heart Cath and Coronary Angiography;  Surgeon: Peter M Martinique, MD;  Location: [place] CV LAB;  Service: Cardiovascular;  Laterality: N/A;  . CARDIAC CATHETERIZATION N/A 07/09/2016   Procedure: Coronary Stent Intervention;  Surgeon: Peter M Martinique, MD;  Location: [place] CV LAB;  Service: Cardiovascular;  Laterality: N/A;  . CATARACT EXTRACTION Bilateral   . CORONARY STENT INTERVENTION N/A 07/06/2017   Procedure: CORONARY STENT INTERVENTION;  Surgeon: Lorretta Harp, MD;  Location: [place] CV LAB;  Service: Cardiovascular;  Laterality: N/A;  . CORONARY STENT PLACEMENT  06/2016   DES  to mid LAD  . CORONARY/GRAFT ACUTE MI REVASCULARIZATION N/A 07/06/2017   Procedure: Coronary/Graft Acute MI Revascularization;  Surgeon: Lorretta Harp, MD;  Location: [place] CV LAB;  Service: Cardiovascular;  Laterality: N/A;  . EYE SURGERY    . LEFT HEART CATH AND CORONARY ANGIOGRAPHY N/A 07/06/2017   Procedure: LEFT HEART CATH AND CORONARY ANGIOGRAPHY;  Surgeon: Lorretta Harp, MD;  Location: [place] CV LAB;  Service: Cardiovascular;  Laterality: N/A;  . LUNG REMOVAL, PARTIAL Right 2008  . PARTIAL HYSTERECTOMY  1986   irregular periods, ovaries remain    Current Outpatient Medications  Medication Sig Dispense Refill  . albuterol (PROAIR HFA) 108 (90 Base) MCG/ACT inhaler Inhale 2 puffs into the lungs every 4 (four) hours as needed for wheezing or shortness of breath. 1 Inhaler 1  . albuterol (PROVENTIL) (2.5 MG/3ML) 0.083% nebulizer solution Take 3 mLs (2.5 mg total) by nebulization every 4 (four) hours as needed for wheezing or shortness of breath. 150 mL 5  . aspirin EC 81 MG tablet Take 1 tablet (81 mg total) by mouth daily.    . calcium carbonate (OS-CAL - DOSED IN MG OF ELEMENTAL CALCIUM) 1250 (500 Ca) MG tablet Take 1 tablet by mouth 2 (two) times daily with a meal.    . denosumab (PROLIA) 60 MG/ML SOLN injection Inject 60 mg into the skin every 6 (six) months. Administer in upper arm, thigh, or abdomen 60 mL 1  . fluticasone (FLONASE) 50 MCG/ACT nasal spray INSTILL TWO SPRAYS INTO BOTH NOSTRILS DAILY. 16 g 5  . furosemide (LASIX) 20 MG tablet Take 1 tablet (20 mg total) by mouth daily. Take an additional 1/2 tablet daily for swelling. 40 tablet 11  . ipratropium-albuterol (DUONEB) 0.5-2.5 (3) MG/3ML SOLN Take 3 mLs by nebulization every 4 (four) hours as needed (wheezing or shortness of breath).    . isosorbide mononitrate (IMDUR) 30 MG 24 hr tablet TAKE 1 TABLET BY MOUTH DAILY 30 tablet 10  . loratadine (CLARITIN) 10 MG tablet Take 10 mg by mouth daily as needed for  allergies or rhinitis.     . LOTEMAX 0.5 % ophthalmic suspension Place 1 drop into both eyes daily.     . nitroGLYCERIN (NITROSTAT) 0.4 MG SL tablet Place 1 tablet (0.4 mg total) under the tongue every 5 (five) minutes as needed for chest pain. 25 tablet 3  . pantoprazole (PROTONIX) 40 MG tablet Take 1 tablet (40 mg total) by mouth daily. 30 tablet 7  . simvastatin (ZOCOR) 40 MG tablet Take 1 tablet (40 mg total) by mouth at bedtime. 30 tablet 6  . Spacer/Aero-Holding Chambers (AEROCHAMBER MV) inhaler Use as instructed 1 each 0  . spironolactone (ALDACTONE) 25 MG tablet Take 1 tablet (25 mg total) by mouth daily. 45 tablet 3  . ticagrelor (BRILINTA) 90 MG TABS tablet Take 1 tablet (90 mg total) by mouth 2 (two) times daily. 60 tablet 11   No current facility-administered medications for this visit.     Allergies:  Actonel [risedronate sodium]; Amlodipine; Fosamax [alendronate sodium]; Lisinopril; Pravastatin; and Codeine   Social History:  The patient  reports that she quit smoking about 15 years ago. Her smoking use included cigarettes. she has never used smokeless tobacco. She reports that she does not drink alcohol or use drugs.   Family History:  The patient's family history includes Breast cancer in her sister; CAD (age of onset: 81) in her father and mother; Diabetes in her sister; Heart attack in her mother; Hypertension in her mother; Stroke in her father and mother.    ROS:  Please see the history of present illness.  Otherwise, review of systems is positive for fatigue, weakness.  All other systems are reviewed and negative.    PHYSICAL EXAM: VS:  BP 136/80   Pulse 75   Ht 5\' 6"  (1.676 m)   Wt 121 lb 12.8 oz (55.2 kg)   BMI 19.66 kg/m  , BMI Body mass index is 19.66 kg/m. GEN: thin, elderly woman in no acute distress  HEENT: normal  Neck: no JVD, no masses. No carotid bruits Cardiac: RRR without murmur or gallop     Respiratory:  clear to auscultation bilaterally, normal  work of breathing GI: soft, nontender, nondistended, + BS MS: no deformity or atrophy  Ext: no pretibial edema Skin: warm and dry, no rash Neuro:  Strength and sensation are intact Psych: euthymic mood, full affect  EKG:  EKG is ordered today. The ekg ordered today shows normal sinus rhythm with frequent PVCs, heart rate 75 bpm, nonspecific intraventricular conduction delay, ST and T wave abnormality consider anterolateral ischemia.  Recent Labs: 07/08/2017: Hemoglobin 12.6; Platelets 240 07/09/2017: B Natriuretic Peptide 460.0; BUN 15; Creatinine, Ser 0.98; Potassium 3.5; Sodium 136   Lipid Panel     Component Value Date/Time   CHOL 129 07/08/2017 0350   TRIG 47 07/08/2017 0350   HDL 66 07/08/2017 0350   CHOLHDL 2.0 07/08/2017 0350   VLDL 9 07/08/2017 0350   LDLCALC 54 07/08/2017 0350   LDLDIRECT 50.0 11/25/2016 1131      Wt Readings from Last 3 Encounters:  07/30/17 121 lb 12.8 oz (55.2 kg)  07/09/17 122 lb 5.7 oz (55.5 kg)  06/10/17 132 lb (59.9 kg)     Cardiac Studies Reviewed: 2D Echo 07-07-2017: Study Conclusions  - Left ventricle: The cavity size was mildly dilated. Wall   thickness was normal. Systolic function was moderately to   severely reduced. The estimated ejection fraction was in the   range of 30% to 35%. Global hypokinesis with severe inferior wall   hypokinesis to akinesis. The study is not technically sufficient   to allow evaluation of LV diastolic function. - Aortic valve: Poorly visualized. There was no stenosis. Trace to   mild regurgitation. - Mitral valve: Mildly thickened leaflets . Dilated annulus with   posterior leaflet tethering. Mild to moderate regurgitation. - Left atrium: Moderately dilated. - Tricuspid valve: There was moderate regurgitation. - Pulmonary arteries: PA peak pressure: 40 mm Hg (S). - Inferior vena cava: The vessel was dilated. The respirophasic   diameter changes were blunted (< 50%), consistent with elevated    central venous pressure.  Impressions:  - Compared to a prior study in 06/2016, there are no significant   changes.   ASSESSMENT AND PLAN: 1.  Coronary artery disease, native vessel, with angina: The patient has had multiple MIs.  While Brilinta is more potent and clopidogrel and provides more consistent platelet inhibition, she is clearly not  can to be able to tolerate this with her chronic shortness of breath and she is markedly worse since starting it.  I recommended that she stop ticagrelor immediately and start taking clopidogrel tomorrow.  She will started a dose of 150 mg tomorrow followed by 75 mg daily thereafter.  Will hold off on a beta-blocker at this time as her blood pressure generally runs very low when she has complained of increasing weakness and fatigue, symptomatically better since stopping low-dose carvedilol.  2.  Frequent PVCs: Continue observation for now.  Consider re-challenging her with a beta 1 selective beta-blocker down the road.  Might consider metoprolol succinate 12.5 mg daily if she is stable at the time of follow-up.  3.  Hyperlipidemia: Treated with simvastatin.  Will continue the same.  4.  Chronic systolic heart failure: Continue furosemide and Spironolactone.  Has not been able to tolerate an ACE or ARB in the past.  Has not tolerated carvedilol even at low doses because of worsening dizziness and weakness.  Current medicines are reviewed with the patient today.  The patient does not have concerns regarding medicines.  Labs/ tests ordered today include:  No orders of the defined types were placed in this encounter.   Disposition:   [facility] one month  Signed, Sherren Mocha, MD  07/30/2017 10:34 AM    [facility] [address], [place], [place]  [address] Phone: [phone]; Fax: [phone]

## 2017-08-02 ENCOUNTER — Encounter: Payer: Self-pay | Admitting: Family Medicine

## 2017-08-02 ENCOUNTER — Other Ambulatory Visit: Payer: Self-pay | Admitting: Pharmacist

## 2017-08-02 ENCOUNTER — Ambulatory Visit (INDEPENDENT_AMBULATORY_CARE_PROVIDER_SITE_OTHER): Payer: PPO | Admitting: Family Medicine

## 2017-08-02 ENCOUNTER — Ambulatory Visit
Admission: RE | Admit: 2017-08-02 | Discharge: 2017-08-02 | Disposition: A | Discharge status: Home / Self Care | Payer: PPO | Source: Ambulatory Visit | Attending: Family Medicine | Admitting: Family Medicine

## 2017-08-02 ENCOUNTER — Ambulatory Visit: Payer: Self-pay | Admitting: Pharmacist

## 2017-08-02 VITALS — BP 118/66 | HR 80 | Temp 97.9°F | Wt 124.0 lb

## 2017-08-02 DIAGNOSIS — I2119 ST elevation (STEMI) myocardial infarction involving other coronary artery of inferior wall: Secondary | ICD-10-CM | POA: Diagnosis not present

## 2017-08-02 DIAGNOSIS — J431 Panlobular emphysema: Secondary | ICD-10-CM

## 2017-08-02 DIAGNOSIS — M810 Age-related osteoporosis without current pathological fracture: Secondary | ICD-10-CM | POA: Diagnosis not present

## 2017-08-02 DIAGNOSIS — R21 Rash and other nonspecific skin eruption: Secondary | ICD-10-CM

## 2017-08-02 DIAGNOSIS — R918 Other nonspecific abnormal finding of lung field: Secondary | ICD-10-CM | POA: Diagnosis not present

## 2017-08-02 DIAGNOSIS — I10 Essential (primary) hypertension: Secondary | ICD-10-CM

## 2017-08-02 DIAGNOSIS — I5042 Chronic combined systolic (congestive) and diastolic (congestive) heart failure: Secondary | ICD-10-CM

## 2017-08-02 DIAGNOSIS — Z1231 Encounter for screening mammogram for malignant neoplasm of breast: Secondary | ICD-10-CM

## 2017-08-02 DIAGNOSIS — I255 Ischemic cardiomyopathy: Secondary | ICD-10-CM

## 2017-08-02 DIAGNOSIS — I25118 Atherosclerotic heart disease of native coronary artery with other forms of angina pectoris: Secondary | ICD-10-CM

## 2017-08-02 LAB — HM MAMMOGRAPHY

## 2017-08-02 MED ORDER — NYSTATIN 100000 UNIT/GM EX CREA
1.0000 | TOPICAL_CREAM | Freq: Two times a day (BID) | CUTANEOUS | 1 refills | Status: DC
Start: 2017-08-02 — End: 2017-08-31

## 2017-08-02 NOTE — Assessment & Plan Note (Signed)
prolia started 2017. Will update DEXA 2 yrs on prolia

## 2017-08-02 NOTE — Patient Outreach (Signed)
[facility]) Care Management  08/02/2017  Lulabelle Desta Conroy 04-16-1941 [id number]   Per chart review, patient had appointment with cardiologist on 07/30/2017. Brilinta stopped and Plavix resumed.    Unsuccessful telephone call to Ms. Ellingwood today.  I left a HIPPA compliant voicemail on the home and mobile phone numbers.     Plan: I will follow-up with Ms. Pelaez later this week regarding her medications.   Ralene Bathe, PharmD, [place] [phone]

## 2017-08-02 NOTE — Assessment & Plan Note (Signed)
Recent STEMI with total occlusion of distal R PDA s/p treatment but unfortunately pt did not tolerate brilinta - now back on aspirin and plavix. Recent aspiration/balloon angioplasty were not successful. Will continue plan as outlined by cardiology

## 2017-08-02 NOTE — Assessment & Plan Note (Signed)
Chronic, stable. Did not tolerate coreg. Continue current regimen.

## 2017-08-02 NOTE — Patient Instructions (Addendum)
We will set you up for repeat bone density scan (last done 2016) Continue prolia.  For possible yeast infection below left breast - treat with nystatin cream for 2 weeks. If no improvement, try over the counter lotrimin cream for 2 weeks and if not better return for a recheck.  Good to see you today, call us with quesitons.

## 2017-08-02 NOTE — Assessment & Plan Note (Signed)
Anticipate intertrigo - will Rx nystatin cream and if ineffective suggested trial lotrimin. If not resolved, to update for further evaluation.

## 2017-08-02 NOTE — Telephone Encounter (Signed)
Spoke with pt, relayed results/recs.  Scheduled next available with RB.  Nothing further needed.

## 2017-08-02 NOTE — Progress Notes (Signed)
BP 118/66 (BP Location: Left Arm, Patient Position: Sitting, Cuff Size: Normal)   Pulse 80   Temp 97.9 F (36.6 C) (Oral)   Wt 124 lb (56.2 kg)   SpO2 96%   BMI 20.01 kg/m    CC: hosp f/u visit Subjective:    Patient ID: [name], female    DOB: 09/01/1940, 77 y.o.   MRN: [id number]  HPI: [name] is a 77 y.o. female presenting on 08/02/2017 for Hospitalization Follow-up (Admitted to [facility] 07/06/17. Says she feels better since starting Plavix.)   Here with niece [name].  Had mammogram this morning.  Rash below L breast that is sore and itchy, present for 2 months.   Recent hospitalization for chest pain found to have inferior STEMI treated with emergent catheterization finding 100% distal-LAD occlusion. EF was 30-35%. brilinta replaced plavix. Previously had LCx and mid LAD stenting. Records reviewed. Started on coreg 3.125mg  bid and spironolactone 25mg  daily. Unfortunately coreg was not tolerated (extreme fatigue) so this was stopped. Saw Dr Burt Knack last week - thought brilinta was causing worsening dyspnea so she was changed back to plavix. Sensitive to many medications.   Continues aspirin 81mg  and plavix.  Osteoporosis - on prolia injections since 2017. She is tolerating this well. Last injection 06/29/2017.   Admit date: 07/06/2017 Discharge date: 07/09/2017 TCM phone call - pt declined. Pt declined TCM appt as well.   Discharge diagnosis: Active Problems:   STEMI (ST elevation myocardial infarction) ([place])   Acute myocardial infarction [facility])    Relevant past medical, surgical, family and social history reviewed and updated as indicated. Interim medical history since our last visit reviewed. Allergies and medications reviewed and updated. Outpatient Medications Prior to Visit  Medication Sig Dispense Refill  . albuterol (PROAIR HFA) 108 (90 Base) MCG/ACT inhaler Inhale 2 puffs into the lungs every 4 (four) hours as needed for wheezing or shortness of breath. 1  Inhaler 1  . albuterol (PROVENTIL) (2.5 MG/3ML) 0.083% nebulizer solution Take 3 mLs (2.5 mg total) by nebulization every 4 (four) hours as needed for wheezing or shortness of breath. 150 mL 5  . aspirin EC 81 MG tablet Take 1 tablet (81 mg total) by mouth daily.    . calcium carbonate (OS-CAL - DOSED IN MG OF ELEMENTAL CALCIUM) 1250 (500 Ca) MG tablet Take 1 tablet by mouth 2 (two) times daily with a meal.    . clopidogrel (PLAVIX) 75 MG tablet Take 1 tablet (75 mg total) by mouth daily. 30 tablet 11  . denosumab (PROLIA) 60 MG/ML SOLN injection Inject 60 mg into the skin every 6 (six) months. Administer in upper arm, thigh, or abdomen 60 mL 1  . fluticasone (FLONASE) 50 MCG/ACT nasal spray INSTILL TWO SPRAYS INTO BOTH NOSTRILS DAILY. 16 g 5  . furosemide (LASIX) 20 MG tablet Take 1 tablet (20 mg total) by mouth daily. Take an additional 1/2 tablet daily for swelling. 40 tablet 11  . ipratropium-albuterol (DUONEB) 0.5-2.5 (3) MG/3ML SOLN Take 3 mLs by nebulization every 4 (four) hours as needed (wheezing or shortness of breath).    . isosorbide mononitrate (IMDUR) 30 MG 24 hr tablet TAKE 1 TABLET BY MOUTH DAILY 30 tablet 10  . loratadine (CLARITIN) 10 MG tablet Take 10 mg by mouth daily as needed for allergies or rhinitis.     . LOTEMAX 0.5 % ophthalmic suspension Place 1 drop into both eyes daily.     . nitroGLYCERIN (NITROSTAT) 0.4 MG SL tablet Place  1 tablet (0.4 mg total) under the tongue every 5 (five) minutes as needed for chest pain. 25 tablet 3  . pantoprazole (PROTONIX) 40 MG tablet Take 1 tablet (40 mg total) by mouth daily. 30 tablet 7  . simvastatin (ZOCOR) 40 MG tablet Take 1 tablet (40 mg total) by mouth at bedtime. 30 tablet 6  . Spacer/Aero-Holding Chambers (AEROCHAMBER MV) inhaler Use as instructed 1 each 0  . spironolactone (ALDACTONE) 25 MG tablet Take 1 tablet (25 mg total) by mouth daily. 45 tablet 3   No facility-administered medications prior to visit.      Per HPI unless  specifically indicated in ROS section below Review of Systems     Objective:    BP 118/66 (BP Location: Left Arm, Patient Position: Sitting, Cuff Size: Normal)   Pulse 80   Temp 97.9 F (36.6 C) (Oral)   Wt 124 lb (56.2 kg)   SpO2 96%   BMI 20.01 kg/m   Wt Readings from Last 3 Encounters:  08/02/17 124 lb (56.2 kg)  07/30/17 121 lb 12.8 oz (55.2 kg)  07/09/17 122 lb 5.7 oz (55.5 kg)    Physical Exam  Constitutional: She appears well-developed and well-nourished. No distress.  HENT:  Mouth/Throat: Oropharynx is clear and moist. No oropharyngeal exudate.  Cardiovascular: Normal rate, regular rhythm, normal heart sounds and intact distal pulses.  No murmur heard. Pulmonary/Chest: Effort normal and breath sounds normal. No respiratory distress. She has no wheezes. She has no rales.  Musculoskeletal: She exhibits no edema.  Skin: Skin is warm and dry. Rash noted.  Pruritic erythematous rash below breast on left  Nursing note and vitals reviewed.     Assessment & Plan:   Problem List Items Addressed This Visit    CAD, NATIVE VESSEL    Continue aspirin, plavix, imdur and statin  Did not tolerate beta blocker or brilinta.       Chronic combined systolic and diastolic heart failure (HCC)   COPD (chronic obstructive pulmonary disease) (HCC)   Essential hypertension    Chronic, stable. Did not tolerate coreg. Continue current regimen.       Ischemic cardiomyopathy   Osteoporosis - Primary    prolia started 2017. Will update DEXA 2 yrs on prolia       Relevant Orders   DG Bone Density   Pulmonary nodules    Pending f/u later this month with pulm for concerning enlarging lower lobe nodules.       Skin rash    Anticipate intertrigo - will Rx nystatin cream and if ineffective suggested trial lotrimin. If not resolved, to update for further evaluation.       STEMI (ST elevation myocardial infarction) ([place])    Recent STEMI with total occlusion of distal R PDA s/p  treatment but unfortunately pt did not tolerate brilinta - now back on aspirin and plavix. Recent aspiration/balloon angioplasty were not successful. Will continue plan as outlined by cardiology          Follow up plan: No Follow-up on file.  Ria Bush, MD

## 2017-08-02 NOTE — Assessment & Plan Note (Signed)
Pending f/u later this month with pulm for concerning enlarging lower lobe nodules.

## 2017-08-02 NOTE — Assessment & Plan Note (Signed)
Continue aspirin, plavix, imdur and statin  Did not tolerate beta blocker or brilinta.

## 2017-08-03 ENCOUNTER — Ambulatory Visit: Payer: Self-pay | Admitting: Pharmacist

## 2017-08-03 ENCOUNTER — Other Ambulatory Visit: Payer: Self-pay | Admitting: Pharmacist

## 2017-08-03 ENCOUNTER — Encounter: Payer: Self-pay | Admitting: Family Medicine

## 2017-08-03 NOTE — Patient Outreach (Signed)
[facility]) Care Management  08/03/2017  [name] [birth year] [id number]  Successful telephone call placed to [name] today. HIPAA identifiers verified. [name] verifies that she has resumed clopidogrel and feels much better off ticagrelor.  Her dyspnea has improved significantly.  She states she does not have problems affording clopidogrel.  She denies any other medication questions or concerns.  She will keep my phone number in case she needs to reach out to me in the future.   Plan: [place] will sign-off patient case at this time.  I am happy to assist in the future as needed.  I will send an MD closure letter and alert [facility] CMA of case closure.   Ralene Bathe, PharmD, [place] [phone]

## 2017-08-08 IMAGING — CR DG CHEST 1V PORT
1 series · 1 of 1 positions shown · non-contrast
Comparison: Chest CT 03/11/2016.  Chest radiograph 03/11/2016.

CLINICAL DATA: 75 y/o  F; shortness of breath.  History of COPD.

EXAM:
PORTABLE CHEST 1 VIEW

[AP]
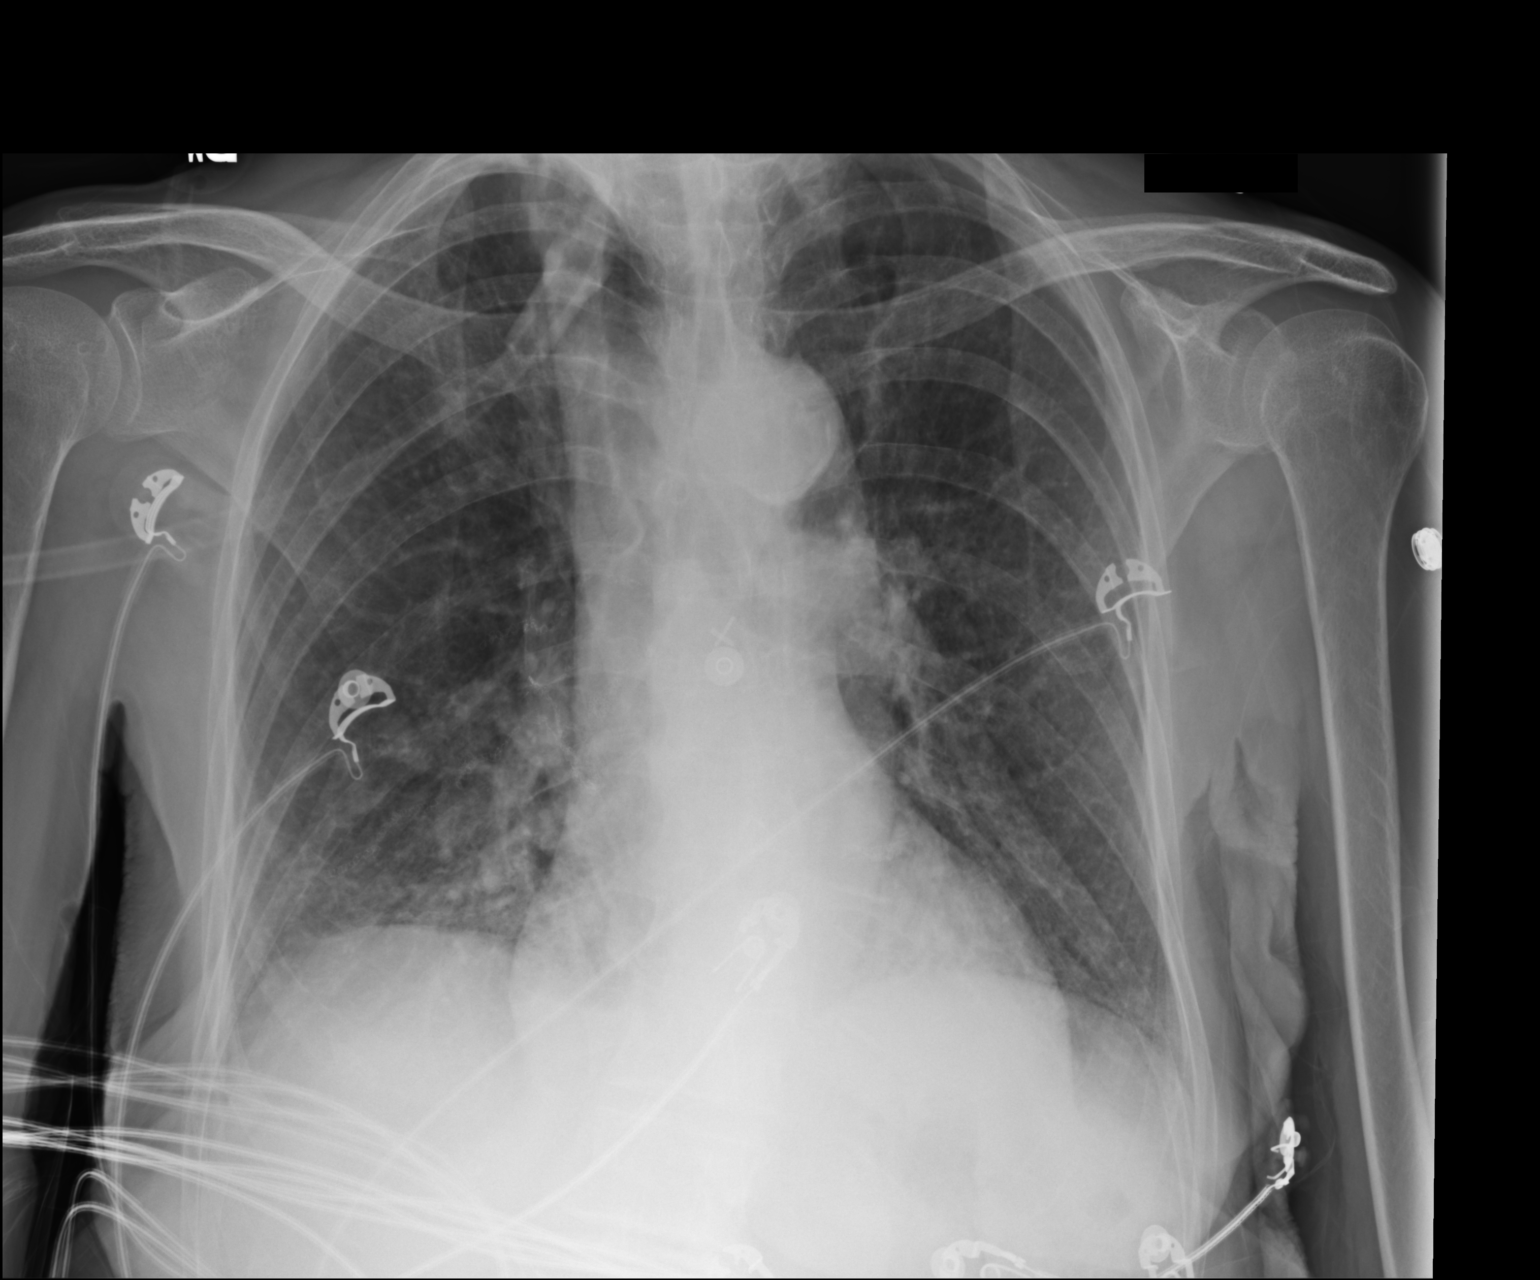

[1 of 1 positions shown; findings below may reference images not displayed]

FINDINGS: Stable cardiac silhouette given differences in positioning
technique. Aortic arch calcifications. Nodular opacities in the
right infrahilar region and right basilar patchy opacity increased
in comparison with prior radiographs.
IMPRESSION: Right basilar opacity increased from prior radiographs may represent
developing pneumonia or atelectasis given right lower lobe airway
obstruction on prior CT.

By: Kvavilebi Husenov M.D.

## 2017-08-20 ENCOUNTER — Ambulatory Visit: Payer: PPO | Admitting: Emergency Medicine

## 2017-08-20 ENCOUNTER — Encounter: Payer: Self-pay | Admitting: Emergency Medicine

## 2017-08-20 ENCOUNTER — Telehealth: Payer: Self-pay | Admitting: Emergency Medicine

## 2017-08-20 DIAGNOSIS — R918 Other nonspecific abnormal finding of lung field: Secondary | ICD-10-CM

## 2017-08-20 DIAGNOSIS — J431 Panlobular emphysema: Secondary | ICD-10-CM

## 2017-08-20 DIAGNOSIS — R911 Solitary pulmonary nodule: Secondary | ICD-10-CM

## 2017-08-20 NOTE — Assessment & Plan Note (Signed)
Bilateral spiculated lower lobe nodules, both of which have increased in size compared to 2017.  The left lower lobe nodule is stable since her aborted biopsy in June 2018.  Right lower lobe nodule appears to be slightly larger.  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  Decision regarding biopsy and treatment is difficult given her age and comorbidities.  She has difficulty laying in position for a biopsy due to dyspnea.  We decided that we would repeat a PET scan in June, look for interval change.  If she clinically changes then we may decide to act sooner.  We will either pursue biopsy under those circumstances or we will discuss with radiation oncology the potential for treatment without tissue diagnosis.

## 2017-08-20 NOTE — Assessment & Plan Note (Signed)
Clinically worse.  She prefers nebulized medications and I believe she is on maximal therapy at this time.  I will repeat her walking oximetry although she did not desaturate on her last check.  Imaging is stable so I do not believe this reflects evolving pleural disease or manifestations of the nodular disease

## 2017-08-20 NOTE — Telephone Encounter (Signed)
Spoke with patient. She was calling to let us know that she had received her oxygen. Nothing further needed at time of call.

## 2017-08-20 NOTE — Progress Notes (Signed)
Subjective:    Patient ID: Garlene Apperson Aguinaga, female    DOB: [birth year], 77 y.o.   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  She has pulmonary nodules that we have followed with serial CT scans, most recent showed an index nodule that had decreased in size so biopsy was deferred.  Her bronchodilators currently include DuoNeb 4x a day. She has had some nasal congestion, hoarse voice last 2-3 days. She has albuterol HFA, uses rarely.  She is on loratadine prn, uses flonase daily. Flu shot is up to date.   ROV 08/20/17 --this is a follow-up visit for 77 year old woman with COPD and history of adenocarcinoma of the lung status post right lower lobe superior segmental segmentectomy.  We have been following a right lower lobe and a left lower lobe pulmonary nodule with serial CT scans.  She also had a PET scan 12/22/16 that showed some mild hypermetabolism in these areas.  We had arranged for a needle biopsy of the left lower lobe nodule to be done 01/05/17 but the left lower lobe nodule was smaller and had decreasing nodularity so the procedure was deferred.  She returns now after a repeat scan done on 07/02/17 that I have reviewed.  Again this shows moderate emphysema and a spiculated right lower lobe nodule abutting the pleura, measures 2.8 x 1.3 cm which is slightly larger than 11/30/16.  The spiculated left lower lobe nodule now measures 1.9 x 1.4 cm, stable from June but larger than studies dating back to 2017.  Both locations are suspicious.   She is having more dyspnea lately, seems to benefit from her albuterol/   We spent 40 minutes of a 50-minute appointment discussing her CT scans, reviewing results, discussing options regarding biopsy versus watchful waiting  Review of Systems     As per history of present illness    Objective:   Physical Exam Vitals:   08/20/17 1015 08/20/17  1016  BP:  136/74  Pulse:  78  SpO2:  97%  Weight: 125 lb (56.7 kg)   Height: 5\' 6"  (1.676 m)     Gen: Anxious, elderly woman, in mild resp distress  ENT: No lesions,  mouth clear,  oropharynx clear, no postnasal drip, weak hoarse voice  Neck: No JVD, no stridor  Lungs: tachypneic, small breaths but no wheezing. No wheeze on forced exp  Cardiovascular: RRR, heart sounds normal, no murmur or gallops, no peripheral edema  Musculoskeletal: No deformities, no cyanosis or clubbing  Neuro: alert, non focal  Skin: Warm, no lesions or rashes      Assessment & Plan:  Pulmonary nodules Bilateral spiculated lower lobe nodules, both of which have increased in size compared to 2017.  The left lower lobe nodule is stable since her aborted biopsy in June 2018.  Right lower lobe nodule appears to be slightly larger.  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  Decision regarding biopsy and treatment is difficult given her age and comorbidities.  She has difficulty laying in position for a biopsy due to dyspnea.  We decided that we would repeat a PET scan in June, look for interval change.  If she clinically changes then we may decide to act sooner.  We will either pursue biopsy under those circumstances or we will discuss with radiation oncology the potential for treatment without tissue diagnosis.  COPD (chronic obstructive  pulmonary disease) ([place]) Clinically worse.  She prefers nebulized medications and I believe she is on maximal therapy at this time.  I will repeat her walking oximetry although she did not desaturate on her last check.  Imaging is stable so I do not believe this reflects evolving pleural disease or manifestations of the nodular disease  Baltazar Apo, MD, PhD 08/20/2017, 11:02 AM [facility] [phone] or if no answer [phone]

## 2017-08-20 NOTE — Addendum Note (Signed)
Addended by: Desmond Dike C on: 08/20/2017 11:19 AM   Modules accepted: Orders

## 2017-08-20 NOTE — Patient Instructions (Signed)
We will perform a PET scan in June 2019 to follow your pulmonary nodules. Please continue DuoNeb 4 times a day Please keep albuterol available to use 2 puffs if needed for shortness of breath, wheezing, chest tightness. We will repeat your walking oximetry on room air today. Follow with Dr Lamonte Sakai in 3 months or sooner if you have any problems.

## 2017-08-21 DIAGNOSIS — R911 Solitary pulmonary nodule: Secondary | ICD-10-CM | POA: Diagnosis not present

## 2017-08-21 DIAGNOSIS — J449 Chronic obstructive pulmonary disease, unspecified: Secondary | ICD-10-CM | POA: Diagnosis not present

## 2017-08-21 DIAGNOSIS — E785 Hyperlipidemia, unspecified: Secondary | ICD-10-CM | POA: Diagnosis not present

## 2017-08-21 DIAGNOSIS — R269 Unspecified abnormalities of gait and mobility: Secondary | ICD-10-CM | POA: Diagnosis not present

## 2017-08-23 ENCOUNTER — Telehealth: Payer: Self-pay | Admitting: Emergency Medicine

## 2017-08-23 NOTE — Telephone Encounter (Signed)
Called and spoke with Almyra Free [facility]) who stated pt was supposed to have received a POC but instead was brought O2 with a push around cart.  Almyra Free stated that pt is not paying attention to where she is walking and is just relying on where she is walking when she is pushing the cart around.  Called and spoke with Corene Cornea from [facility] and stated to him that pt's daughter thought pt was going to be set up with a POC but instead received the canister and a cart.  Corene Cornea stated to me another order was not needed due to just having the previous order and would get pt set up for a POC eval.    Called Almyra Free (pt's niece) letting her know what I found from [facility].  Almyra Free expressed understanding. Nothing further needed at this current time.

## 2017-08-24 ENCOUNTER — Telehealth: Payer: Self-pay | Admitting: Emergency Medicine

## 2017-08-24 ENCOUNTER — Ambulatory Visit
Admission: RE | Admit: 2017-08-24 | Discharge: 2017-08-24 | Disposition: A | Discharge status: Home / Self Care | Payer: PPO | Source: Ambulatory Visit | Attending: Family Medicine | Admitting: Family Medicine

## 2017-08-24 DIAGNOSIS — Z780 Asymptomatic menopausal state: Secondary | ICD-10-CM | POA: Diagnosis not present

## 2017-08-24 DIAGNOSIS — M810 Age-related osteoporosis without current pathological fracture: Secondary | ICD-10-CM

## 2017-08-24 LAB — HM DEXA SCAN

## 2017-08-24 MED ORDER — PREDNISONE 10 MG PO TABS: ORAL_TABLET | ORAL | 0 refills | Status: DC

## 2017-08-24 NOTE — Telephone Encounter (Signed)
ATC pt, no answer. Left message for pt to call back.   Rx sent to the pharmacy.

## 2017-08-24 NOTE — Telephone Encounter (Signed)
Okay for patient to use over-the-counter decongestant such as Tylenol Cold and flu as directed  Please have her take prednisone as follows: Take 30mg  daily for 3 days, then 20mg  daily for 3 days, then 10mg  daily for 3 days, then stop

## 2017-08-24 NOTE — Telephone Encounter (Signed)
I have left a detailed message on pt's named voicemail. Nothing further was needed.

## 2017-08-24 NOTE — Telephone Encounter (Signed)
Spoke with pt's niece Neoma Laming (pt gave verbal). Neoma Laming stated that pt has prod cough (pt is unsure of color), wheezing, chest discomfort x3d. pt states SOB is baseline.  Not taken any OTC medications.  Denies fever, chills sweats or body aches.  Allergies  Allergen Reactions  . Actonel [Risedronate Sodium] Other (See Comments)    Headache  . Amlodipine Other (See Comments)    Pedal edema  . Brilinta [Ticagrelor] Other (See Comments)    Worsening dyspnea, malaise  . Fosamax [Alendronate Sodium] Other (See Comments)    Unable to tolerate  . Lisinopril Cough  . Pravastatin Other (See Comments)    Constipation.  . Codeine Rash     RB please advise. Thanks.

## 2017-08-24 NOTE — Telephone Encounter (Signed)
Pt returning call. Cb is [phone].

## 2017-08-25 ENCOUNTER — Ambulatory Visit: Payer: PPO

## 2017-08-26 IMAGING — DX DG CHEST 2V
2 series · 2 of 2 positions shown · non-contrast
Comparison: Chest radiograph performed 03/23/2016

CLINICAL DATA: Acute onset of generalized chest pain and nausea.
Shortness of breath. Cough and congestion. Initial encounter.

EXAM:
CHEST  2 VIEW

[chest pa]
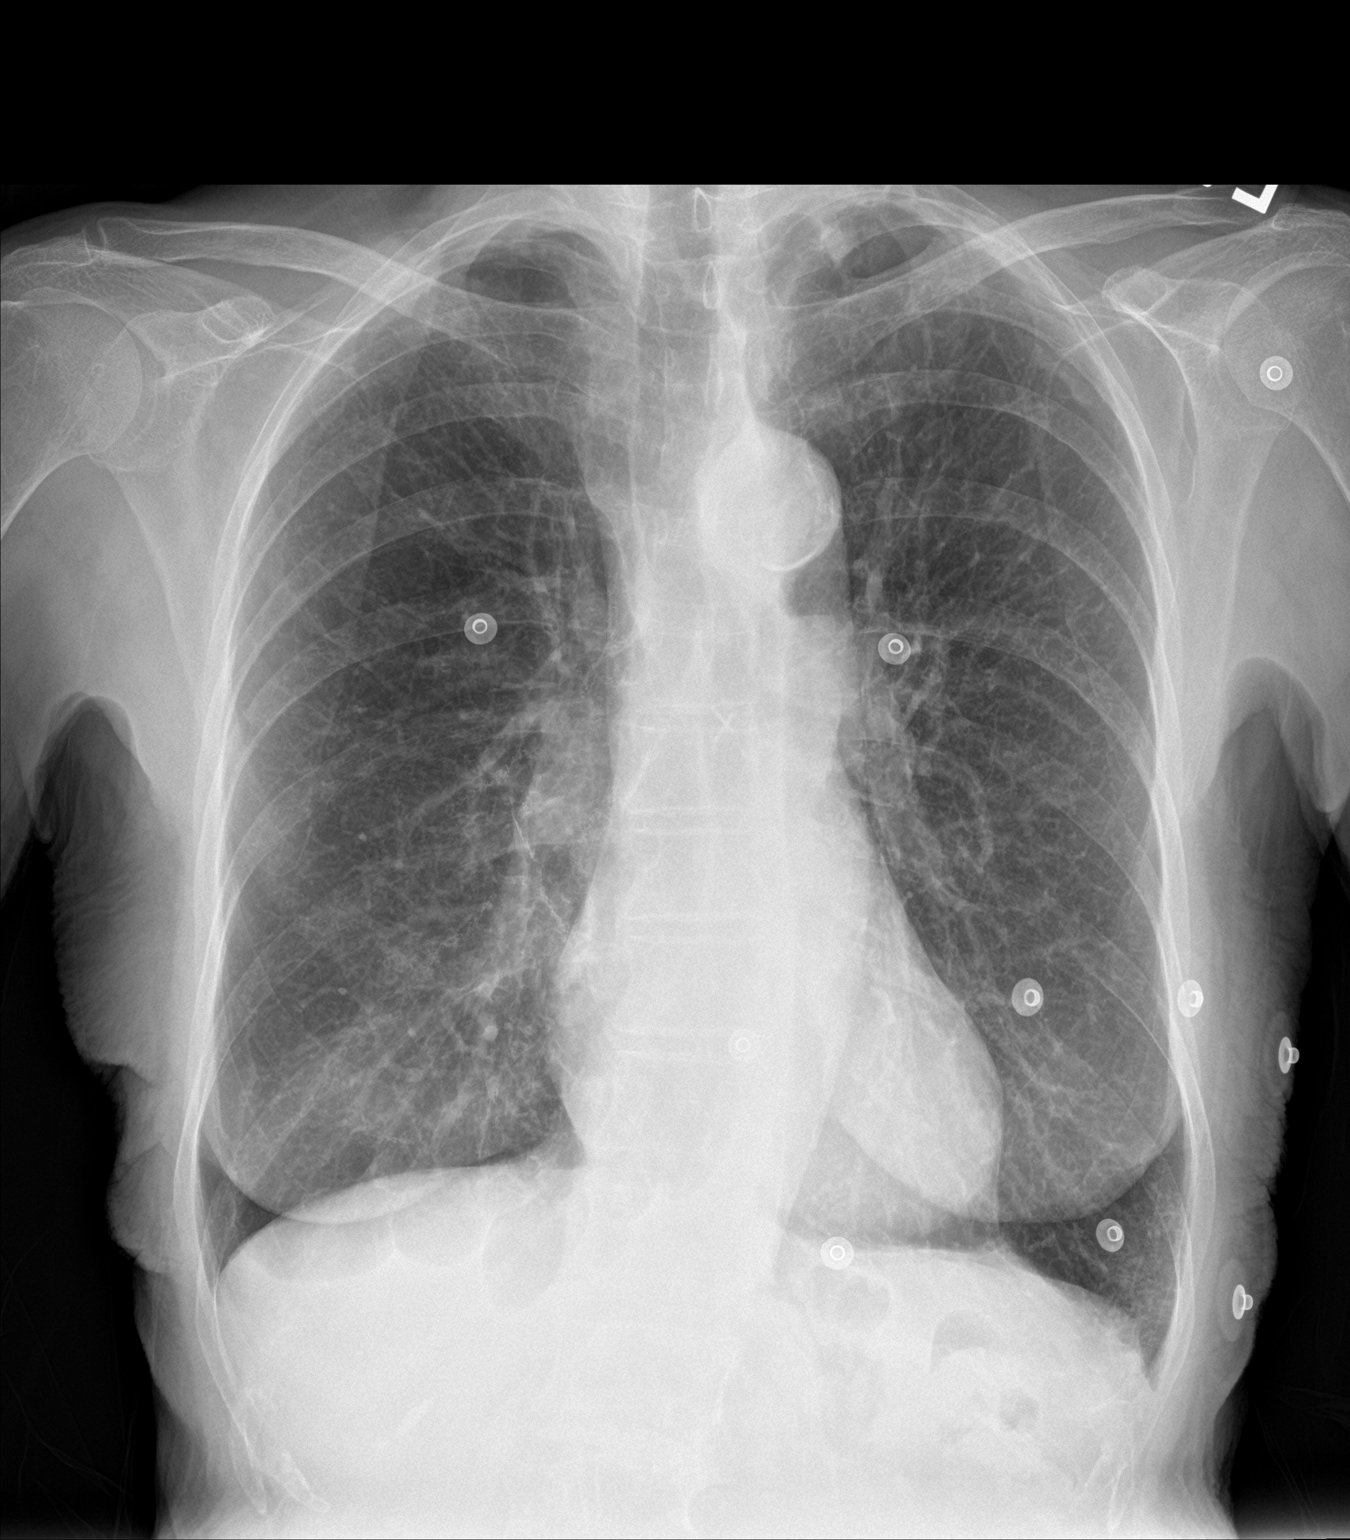

[chest lat]
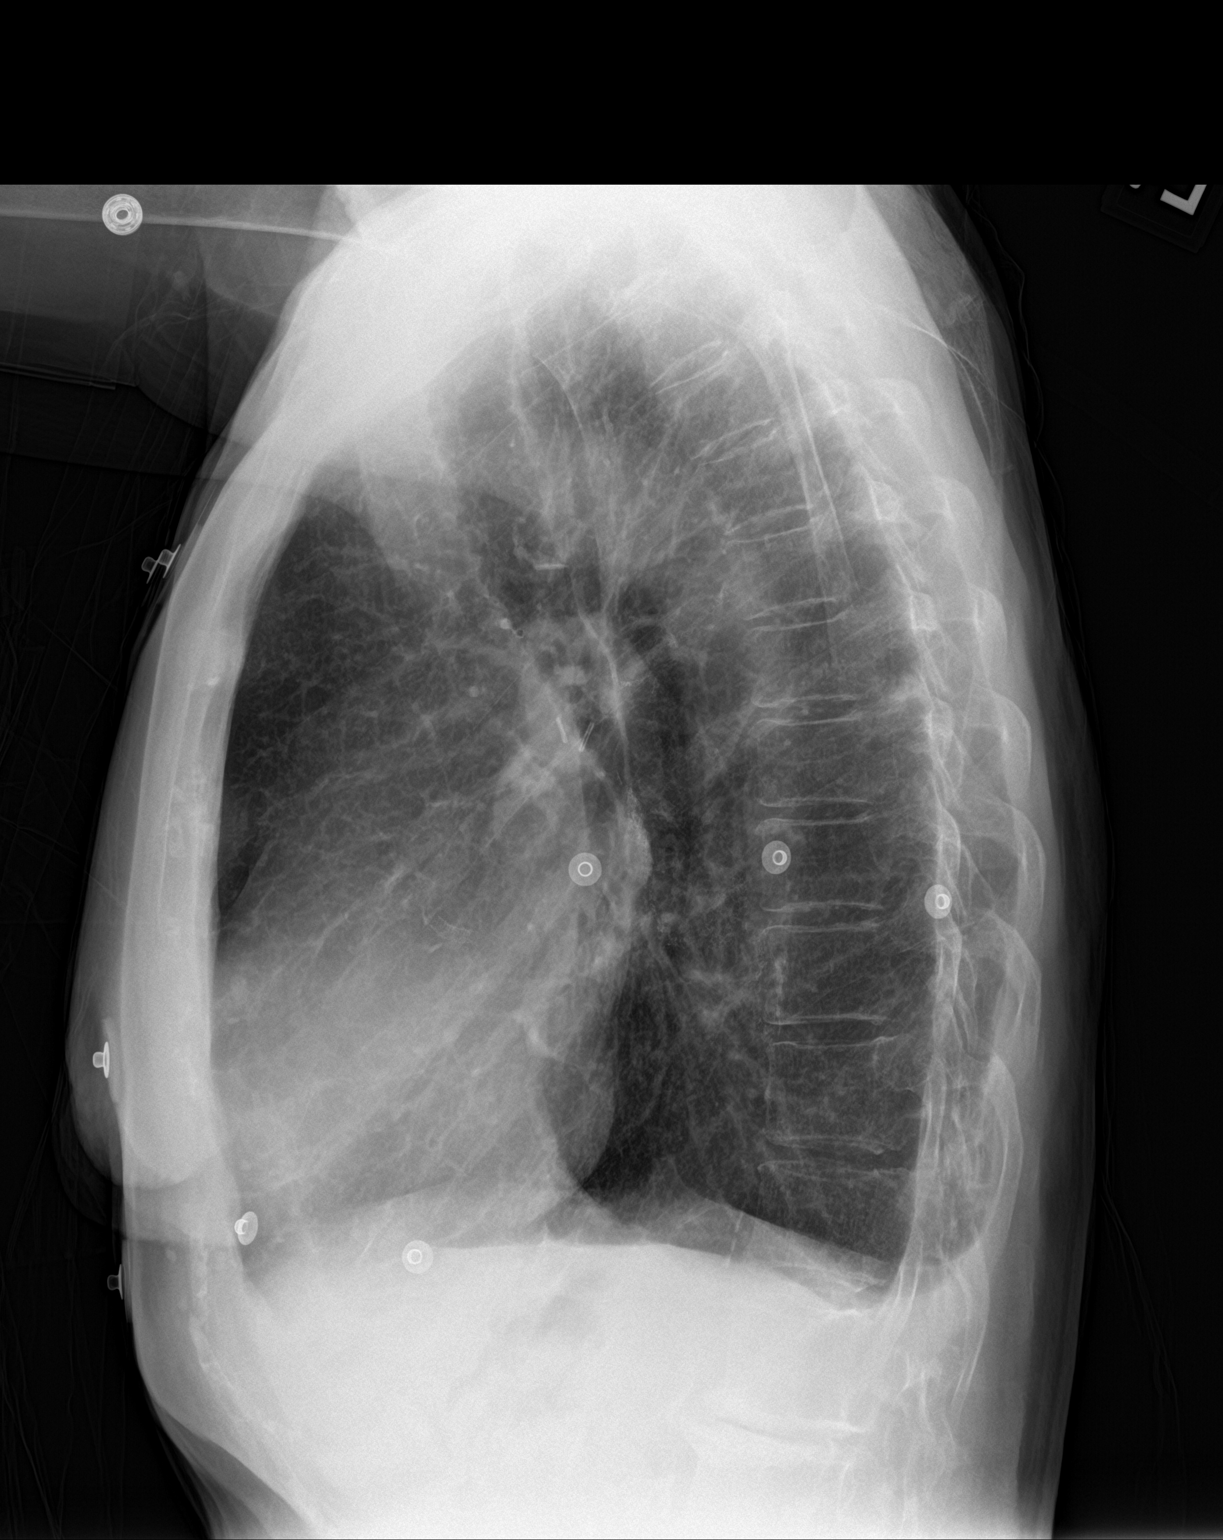

[2 of 2 positions shown; findings below may reference images not displayed]

FINDINGS: The lungs are hyperexpanded, with flattening of the hemidiaphragms,
compatible with COPD. Minimal right basilar opacity may reflect mild
pneumonia. There is no evidence of pleural effusion or pneumothorax.
Nodular scarring is again noted at the left lung apex.

The heart is normal in size; the mediastinal contour is within
normal limits. No acute osseous abnormalities are seen.
IMPRESSION: Minimal right basilar opacity may reflect mild pneumonia. Findings
of COPD.

## 2017-08-27 ENCOUNTER — Telehealth: Payer: Self-pay | Admitting: Emergency Medicine

## 2017-08-27 MED ORDER — DOXYCYCLINE HYCLATE 100 MG PO TABS: 100.0000 mg | ORAL_TABLET | Freq: Two times a day (BID) | ORAL | 0 refills | Status: DC

## 2017-08-27 NOTE — Telephone Encounter (Signed)
Called and spoke with Almyra Free letting her know we were sending an Rx of doxy to pt's pharmacy and if the wheezing continued after being on meds for her to either be seen at our office or go to the ER for evaluation.  Almyra Free expressed understanding. Rx sent to preferred pharmacy. Nothing further needed at this current time.

## 2017-08-27 NOTE — Telephone Encounter (Signed)
Pt niece calling to request something be called in for the Pt. Per Almyra Free pt is having chest congestion, and trouble breathing. Pharm is [facility].   Called and spoke with pt's niece - [name]  She states patient is on prednisone taper of RB since 08/24/2017, today is third day on prednisone Pt reports prod cough- brown green thick mucus, wheezing, SOB at rest and w/exertion and sore throat, not breathing well during night last night, nor this morning  Pt states symptoms has worsened in last 2 days Per pt's niece - Almyra Free- requested something else called in for pt if possible There is no available appts today at this time  Pt's niece called 08/24/2017 at this time RB recommended patient to use over-the-counter decongestant such as Tylenol Cold and flu as directed; pt's niece denied pt using this medication, pt is only using prednisone taper    RB please advise

## 2017-08-27 NOTE — Telephone Encounter (Signed)
Called and spoke with pt's niece - [name] She advised that pt was instructed she had to come in office to see RB for POC evaluation Called spoke to [facility] with [facility]; she stated that pt needs to come into [facility] for POC eval Explained info to pt's niece, she verbalized understanding  Pt has appt with [facility] for POC eval on 09/01/2017 at 1:00pm. Nothing further needed at this time.

## 2017-08-27 NOTE — Telephone Encounter (Signed)
She can start doxycycline 100mg  bid x 7 days.  If the wheeze continues on the meds we've recommended then she will need to be seen here or go to ED to be evaluated.

## 2017-08-30 ENCOUNTER — Other Ambulatory Visit: Payer: Self-pay | Admitting: *Deleted

## 2017-08-30 ENCOUNTER — Encounter: Payer: Self-pay | Admitting: Family Medicine

## 2017-08-31 ENCOUNTER — Encounter: Payer: Self-pay | Admitting: Physician Assistant

## 2017-08-31 ENCOUNTER — Ambulatory Visit: Payer: PPO | Admitting: Physician Assistant

## 2017-08-31 VITALS — BP 116/60 | HR 66 | Ht 66.0 in | Wt 122.1 lb

## 2017-08-31 DIAGNOSIS — J449 Chronic obstructive pulmonary disease, unspecified: Secondary | ICD-10-CM | POA: Diagnosis not present

## 2017-08-31 DIAGNOSIS — I5042 Chronic combined systolic (congestive) and diastolic (congestive) heart failure: Secondary | ICD-10-CM

## 2017-08-31 DIAGNOSIS — I25118 Atherosclerotic heart disease of native coronary artery with other forms of angina pectoris: Secondary | ICD-10-CM | POA: Diagnosis not present

## 2017-08-31 DIAGNOSIS — I493 Ventricular premature depolarization: Secondary | ICD-10-CM

## 2017-08-31 NOTE — Patient Instructions (Signed)
Medication Instructions:  1. Your physician recommends that you continue on your current medications as directed. Please refer to the Current Medication list given to you today.   Labwork: NONE ORDERED TODAY  Testing/Procedures: NONE ORDERED TODAY  Follow-Up: 09/24/17 @ 9:45 WITH SCOTT WEAVER, PAC   Any Other Special Instructions Will Be Listed Below (If Applicable).     If you need a refill on your cardiac medications before your next appointment, please call your pharmacy.

## 2017-08-31 NOTE — Patient Outreach (Signed)
[facility]) Care Management  08/30/2017  [name] 1941-04-07 [id number]   Telephone Screen  Referral Date: 08/30/17 Referral Source: Nurse Call Center Referral Reason: shortness of breath (SOB) or sounds breathless Insurance: HTA  Outreach attempt # 1 to patient and HIPAA verified. Patient acknowledged having "very bad COPD". She described being so anxious that she forgot what to do when she became SOB. Patient had oxygen, a nebulizer, and a rescue inhaler available for acute exacerbations. Patient contacted her niece and her niece was available to assist patient. Patient doesn't have an action plan for an acute COPD exacerbation, other than the inhaler or nebulizer. Per patient, "Her niece connected the tubing together". She has Oxygen to use, as needed. She is reluctant to use the Oxygen, because she doesn't feel that it is beneficial for her. Patient stated, she's used her rescue inhaler once in the past 3 days. She verbalized being prescribed a nebulizer 4 times per day. [facility] services and benefits explained to patient and she agreed.   Plan: RN CM advised patient to contact RNCM for any needs or concerns. RN CM advised patient to alert MD for any changes in conditions.  RN CM will send referral to [facility] RN for further in home eval/assessment of care needs and management of chronic conditions.  Lake Bells, RN, BSN, MHA/MSL, [place] Telephonic Care Manager Coordinator [facility] Direct Phone: [phone] Toll Free: [phone] Fax: [phone]

## 2017-08-31 NOTE — Progress Notes (Signed)
Cardiology Office Note:    Date:  08/31/2017   ID:  Nayah Lukens Waiters, DOB [birth year], MRN [id number]  PCP:  Ria Bush, MD  Cardiologist:  Sherren Mocha, MD   Referring MD: Ria Bush, MD   Chief Complaint  Patient presents with  . Follow-up    CAD, CHF    History of Present Illness:    [name] is a 77 y.o. female with a hx of coronary artery disease, systolic heart failure, prior stroke, non-small cell lung cancer status post resection, COPD.  She initially presented with an acute myocardial infarction in 2010 secondary to an occluded left circumflex which was treated with primary PCI with a bare-metal stent.  She had an anterior ST elevation myocardial infarction in July 2017 secondary to an occluded apical LAD which was treated medically.  She has been unable to tolerate beta-blocker or ACE inhibitor secondary to chronic lung disease.  She suffered a non-ST elevation myocardial infarction December 2017.  Cardiac catheterization demonstrated patent stent in the LCx, occlusion of the very distal LAD (old), ruptured plaque in the mid LAD and moderate ostial stenosis in D1.  Patient was treated with DES to the mid LAD.  A Holter monitor in September 2018 did demonstrate sinus rhythm and frequent PVCs (22%) as well as short ventricular runs.  Most recently, she suffered an inferior STEMI December 2018 secondary to an occluded distal apical PDA.  Balloon angioplasty was attempted but unsuccessful.  She was last seen by Dr. Burt Knack January 2019.  Ticagrelor was stopped secondary to significant dyspnea.  Ms. [place]bay returns for follow-up.  She feels better on clopidogrel rather than ticagrelor.  She remains significantly short of breath.  She has had a recent exacerbation and is currently on prednisone and doxycycline.  She has had some symptoms of chest discomfort associated with nocturnal dyspnea.  Her symptoms improved with nebulizer therapy.  She has not taken nitroglycerin and  does not feel as though she has experienced angina.  She denies syncope.  She denies edema.  Prior CV studies:   The following studies were reviewed today:  Cardiac catheterization 07/06/17 LAD proximal 40, proximal stent patent, distal 100, D1 ostial 80 LCx proximal stent patent RPDA 100 PCI: Unsuccessful POBA  Echo 07/07/17 EF 30-35, global HK with severe inferior HK-AK, mild to moderate MR, moderate LAE, moderate TR, PASP 40  Holter monitor 04/07/17 The basic rhythm is sinus.  There are no significant bradyarrhythmias or pathologic pauses > 3 seconds There are frequent PVC's (22%), as well as short ventricular runs No atrial fibrillation or sustained tachyarrhythmias  Cardiac catheterization 07/09/16 LAD proximal 75, distal 100, D1 ostial 80 LCx stent patent with 10 ISR EF 25-35 PCI: 3 x 8 mm Promus Premier DES to the proximal LAD  Echo 06/28/16 Inferior/inferolateral HK, mild LVH, EF 30-35, mild AI, moderate MR, mild BAE, PASP 39  Echo 03/20/16 EF 40-45, anterolateral and inferolateral hypokinesis, grade 2 diastolic dysfunction, MAC, mild MR, mild LAE, PASP 45  LHC 7/17 LAD proximal 65, distal 100, D1 ostial 65 LCx proximal stent patent with 10% ISR Acute coronary syndrome with occlusion of distal LAD in a small tortuous segment. Widely patent stent in the circumflex. Eccentric 65% stenosis in the mid LAD beyond the first agonal. 60% first diagonal ostial stenosis. Both sites are unchanged from prior angiogram. New finding of very distal LAD total occlusion. Dominant right coronary artery with luminal irregularities. Moderate left ventricular systolic dysfunction with regional wall motion abnormality including  new apical severe hypokinesis and suggestion of anterolateral wall akinesis/dyskinesis (old). Estimated ejection fraction is 35%. LVEDP is low normal.  Echo (12/13):  EF 45-50%, posterior wall akinesis, lateral wall and inferior wall hypokinesis, mild  MR  Carotid US (12/13):  No significant ICA stenosis  Nuclear (10/10):  EF 32%, inf-lat scar; no ischemia  LHC (10/10):  Inferolateral akinesis and dyskinesis, EF 40%, D1 30%, LAD 50-60%, mid CFX stent patent, proximal RCA 30%, mid RCA 50%  Past Medical History:  Diagnosis Date  . Arthritis   . CAD (coronary artery disease) 03/2009   s/p MI  . Chronic combined systolic and diastolic heart failure ([place]) 06/17/2009   Qualifier: Diagnosis of  By: Burt Knack, MD, [place] Pert   . CKD (chronic kidney disease) stage 3, GFR 30-59 ml/min (HCC)   . Emphysema/COPD ([place])   . Ex-smoker quit 2008  . GERD (gastroesophageal reflux disease)   . HCAP (healthcare-associated pneumonia) 03/19/2016  . Helicobacter pylori gastritis 10/2007   treated  . History of CVA (cerebrovascular accident) without residual deficits 01/2012, 06/2012   R hemorrhagic MCA 01/2012 with remote lacunar infarct L putamen and IC, rpt 06/2012 acute multifocal R MCA infarct with remote hemorrhagic strokes affecting L basal ganglia and periventricular white matter, full recovery  . HLD (hyperlipidemia)   . HTN (hypertension)   . Ischemic cardiomyopathy 02/27/2014  . Lung cancer ([place]) dx'd 07/2006   Lung CA, s/p resection, followed by Dr Earlie Server  . Macular degeneration   . Myocardial infarction [facility]) 03/2009   Acute myocardial infarction 2010 - treated with BMS of LCx. LVEF 50% with subsequent CHF  . NSTEMI (non-ST elevated myocardial infarction) ([place])   . Osteoporosis 09/2013   T -3.6 forearm 09/2013, T -4.5 forearm 04/2015  . Stroke [facility])     Past Surgical History:  Procedure Laterality Date  . BREAST BIOPSY Left   . CARDIAC CATHETERIZATION N/A 02/24/2016   Procedure: Left Heart Cath and Coronary Angiography;  Surgeon: Belva Crome, MD;  Location: [place] CV LAB;  Service: Cardiovascular;  Laterality: N/A;  . CARDIAC CATHETERIZATION N/A 07/09/2016   Procedure: Left Heart Cath and Coronary Angiography;  Surgeon: Peter  M Martinique, MD;  Location: [place] CV [facility];  Service: Cardiovascular;  Laterality: N/A;  . CARDIAC CATHETERIZATION N/A 07/09/2016   Procedure: Coronary Stent Intervention;  Surgeon: Peter M Martinique, MD;  Location: [place] CV LAB;  Service: Cardiovascular;  Laterality: N/A;  . CATARACT EXTRACTION Bilateral   . CORONARY STENT INTERVENTION N/A 07/06/2017   Procedure: CORONARY STENT INTERVENTION;  Surgeon: Lorretta Harp, MD;  Location: [place] CV LAB;  Service: Cardiovascular;  Laterality: N/A;  . CORONARY STENT PLACEMENT  06/2016   DES to mid LAD  . CORONARY/GRAFT ACUTE MI REVASCULARIZATION N/A 07/06/2017   Procedure: Coronary/Graft Acute MI Revascularization;  Surgeon: Lorretta Harp, MD;  Location: [place] CV LAB;  Service: Cardiovascular;  Laterality: N/A;  . EYE SURGERY    . LEFT HEART CATH AND CORONARY ANGIOGRAPHY N/A 07/06/2017   Procedure: LEFT HEART CATH AND CORONARY ANGIOGRAPHY;  Surgeon: Lorretta Harp, MD;  Location: [place] CV LAB;  Service: Cardiovascular;  Laterality: N/A;  . LUNG REMOVAL, PARTIAL Right 2008  . PARTIAL HYSTERECTOMY  1986   irregular periods, ovaries remain    Current Medications: Current Meds  Medication Sig  . albuterol (PROAIR HFA) 108 (90 Base) MCG/ACT inhaler Inhale 2 puffs into the lungs every 4 (four) hours as needed for wheezing or shortness  of breath.  Marland Kitchen albuterol (PROVENTIL) (2.5 MG/3ML) 0.083% nebulizer solution Take 3 mLs (2.5 mg total) by nebulization every 4 (four) hours as needed for wheezing or shortness of breath.  Marland Kitchen aspirin EC 81 MG tablet Take 1 tablet (81 mg total) by mouth daily.  . calcium carbonate (OS-CAL - DOSED IN MG OF ELEMENTAL CALCIUM) 1250 (500 Ca) MG tablet Take 1 tablet by mouth 2 (two) times daily with a meal.  . clopidogrel (PLAVIX) 75 MG tablet Take 1 tablet (75 mg total) by mouth daily.  Marland Kitchen denosumab (PROLIA) 60 MG/ML SOLN injection Inject 60 mg into the skin every 6 (six) months. Administer in upper  arm, thigh, or abdomen  . doxycycline (VIBRA-TABS) 100 MG tablet Take 1 tablet (100 mg total) by mouth 2 (two) times daily.  . fluticasone (FLONASE) 50 MCG/ACT nasal spray INSTILL TWO SPRAYS INTO BOTH NOSTRILS DAILY.  . furosemide (LASIX) 20 MG tablet Take 1 tablet (20 mg total) by mouth daily. Take an additional 1/2 tablet daily for swelling.  Marland Kitchen ipratropium-albuterol (DUONEB) 0.5-2.5 (3) MG/3ML SOLN Take 3 mLs by nebulization every 4 (four) hours as needed (wheezing or shortness of breath).  . isosorbide mononitrate (IMDUR) 30 MG 24 hr tablet TAKE 1 TABLET BY MOUTH DAILY  . loratadine (CLARITIN) 10 MG tablet Take 10 mg by mouth daily as needed for allergies or rhinitis.   . LOTEMAX 0.5 % ophthalmic suspension Place 1 drop into both eyes daily.   . nitroGLYCERIN (NITROSTAT) 0.4 MG SL tablet Place 1 tablet (0.4 mg total) under the tongue every 5 (five) minutes as needed for chest pain.  . pantoprazole (PROTONIX) 40 MG tablet Take 1 tablet (40 mg total) by mouth daily.  . predniSONE (DELTASONE) 10 MG tablet Take 3 tabs by mouth for 3 days, 2 for 3 days, 1 for 3 days and stop  . simvastatin (ZOCOR) 40 MG tablet Take 1 tablet (40 mg total) by mouth at bedtime.  Marland Kitchen Spacer/Aero-Holding Chambers (AEROCHAMBER MV) inhaler Use as instructed  . spironolactone (ALDACTONE) 25 MG tablet Take 1 tablet (25 mg total) by mouth daily.     Allergies:   Actonel [risedronate sodium]; Amlodipine; Brilinta [ticagrelor]; Fosamax [alendronate sodium]; Lisinopril; Pravastatin; and Codeine   Social History   Tobacco Use  . Smoking status: Former Smoker    Types: Cigarettes    Last attempt to quit: 07/27/2002    Years since quitting: 15.1  . Smokeless tobacco: Never Used  Substance Use Topics  . Alcohol use: No    Alcohol/week: 0.0 oz  . Drug use: No     Family Hx: The patient's family history includes Breast cancer in her sister; CAD (age of onset: 39) in her father and mother; Diabetes in her sister; Heart attack  in her mother; Hypertension in her mother; Stroke in her father and mother.  ROS:   Please see the history of present illness.    Review of Systems  Cardiovascular: Positive for chest pain.  Respiratory: Positive for cough and shortness of breath.    All other systems reviewed and are negative.   EKGs/Labs/Other Test Reviewed:    EKG:  EKG is not ordered today.    Recent Labs: 07/08/2017: Hemoglobin 12.6; Platelets 240 07/09/2017: B Natriuretic Peptide 460.0; BUN 15; Creatinine, Ser 0.98; Potassium 3.5; Sodium 136   Recent Lipid Panel Lab Results  Component Value Date/Time   CHOL 129 07/08/2017 03:50 AM   TRIG 47 07/08/2017 03:50 AM   HDL 66 07/08/2017 03:50 AM  CHOLHDL 2.0 07/08/2017 03:50 AM   LDLCALC 54 07/08/2017 03:50 AM   LDLDIRECT 50.0 11/25/2016 11:31 AM    Physical Exam:    VS:  BP 116/60   Pulse 66   Ht _0  (1.676 m)   Wt 122 lb 1.9 oz (55.4 kg)   SpO2 96%   BMI 19.71 kg/m     Wt Readings from Last 3 Encounters:  08/31/17 122 lb 1.9 oz (55.4 kg)  08/20/17 125 lb (56.7 kg)  08/02/17 124 lb (56.2 kg)     Physical Exam  Constitutional: She is oriented to person, place, and time. She appears well-developed. She appears cachectic. No distress.  HENT:  Head: Normocephalic and atraumatic.  Neck: No JVD present.  Cardiovascular: Normal rate. An irregular rhythm present.  No murmur heard. Pulmonary/Chest: She has decreased breath sounds. She has no rales.  Abdominal: Soft.  Musculoskeletal: She exhibits no edema.  Neurological: She is alert and oriented to person, place, and time.  Skin: Skin is warm and dry.    ASSESSMENT & PLAN:    1.  Coronary artery disease Status post multiple myocardial infarctions and percutaneous coronary interventions.  Most recent myocardial infarction in December 2018 with unsuccessful attempt at balloon angioplasty of the distal PDA.  She has had some discomfort recently in her chest but this seems to be more related to  COPD exacerbation than angina.  She denies any recurrent symptoms consistent with her previous angina.  She does feel better after stopping Brilinta and resuming Plavix.  Continue aspirin, Plavix, nitrates.  2.  Chronic combined systolic and diastolic heart failure (HCC) NYHA 3.  Dyspnea is mainly related to COPD.  Volume status is stable on exam.  She has been intolerant of ACE inhibitors and ARB's in the past as well as beta-blockers.  Continue nitrates, spironolactone.  3.  PVC's (premature ventricular contractions) Plan was to consider cardioselective beta-blocker today.  However, with her recent exacerbation of COPD, I am hesitant to start even a cardioselective beta-blocker.  I have suggested that we continue current therapy and observation of PVCs.  I will see her back in several weeks to again consider initiation of metoprolol succinate 12.5 mg daily.  4.  Chronic obstructive pulmonary disease, unspecified COPD type ([place]) Continue follow-up with Dr. Lamonte Sakai as planned.    Dispo:  Return in about 4 weeks (around 09/28/2017) for Close Follow Up, w/ Richardson Dopp, PA-C.   Medication Adjustments/Labs and Tests Ordered: Current medicines are reviewed at length with the patient today.  Concerns regarding medicines are outlined above.  Tests Ordered: No orders of the defined types were placed in this encounter.  Medication Changes: No orders of the defined types were placed in this encounter.   Signed, Richardson Dopp, PA-C  08/31/2017 10:10 AM    [facility] [place], [place], [place]  [address] Phone: [phone]; Fax: [phone]

## 2017-09-06 ENCOUNTER — Telehealth: Payer: Self-pay | Admitting: Emergency Medicine

## 2017-09-06 MED ORDER — LEVOFLOXACIN 500 MG PO TABS: 500.0000 mg | ORAL_TABLET | Freq: Every day | ORAL | 0 refills | Status: DC

## 2017-09-06 NOTE — Telephone Encounter (Signed)
Pt co sore throat, fatigue, prod cough with yellow mucus.  Pt called in on 2/1 and was prescribed doxy X7 days, which helped with s/s, but since yesterday s/s are resuming. Denies fever, chills, muscle aches.   Requesting another round of doxycycline, or if RB feels something else may be more appropriate.   Pt requests no prednisone- states she does not tolerate this well.  Pt uses [facility] please advise.  Thanks

## 2017-09-06 NOTE — Telephone Encounter (Signed)
Patient returning call, CB is [phone].

## 2017-09-06 NOTE — Telephone Encounter (Signed)
lmtcb X1 for pt. rx called in to pharmacy.

## 2017-09-06 NOTE — Telephone Encounter (Signed)
Please ask her to take levaquin 500mg  daily x 7 days.   If her sx do not improve or if they return after treatment then she needs to be seen in office to trouble shoot

## 2017-09-06 NOTE — Telephone Encounter (Signed)
Spoke with pt,aware of recs.  Nothing further needed.  

## 2017-09-09 ENCOUNTER — Other Ambulatory Visit: Payer: Self-pay | Admitting: *Deleted

## 2017-09-09 NOTE — Patient Outreach (Signed)
09/09/17  [name]  1940-12-17 [id number]  Successful telephone encounter to [name], 77 year old female for follow up on referral received  08/31/17 from [place] for Community CM services/further in home evaluation/ Assessment of care needs and management of chronic conditions.   Original referral source- [facility].   Spoke with pt, HIPAA identifiers verified, discussed purpose of call- follow up on referral.  Pt reports on recent call to Lung MD (view in EMR - pt called 09/06/17 about cough, congestion, started on Levaquin), started on a strong antibiotic, doing better, best she's been in a month, still on antibiotic.  RN CM discussed with pt doing a home visit tomorrow to which pt agreed.   Provided pt with RN CM's  Contact information to call if needed before home visit.   Plan:  As discussed with pt, plan to follow up again tomorrow- initial home visit.    [name].   [place]  [phone]

## 2017-09-10 ENCOUNTER — Other Ambulatory Visit: Payer: Self-pay | Admitting: *Deleted

## 2017-09-10 ENCOUNTER — Encounter: Payer: Self-pay | Admitting: *Deleted

## 2017-09-10 NOTE — Patient Outreach (Addendum)
[facility]) Care Management   09/10/2017  [name] 01/23/1941 [id number]  [name] is an 77 y.o. female  Subjective:  Pt reports congestion better, continues on antibiotic  Prescribed by Lung MD last week, also doing nebulizer treatments  4 times a day.  Pt reports she realizes now should have called MD  sooner.  Pt reports she uses her oxygen during the day as  Needed.   Pt reports scheduled to have PET scan soon.   Objective:   Vitals:   09/10/17 1131  BP: 118/80  Pulse: 70  Resp: (!) 28  SpO2: 94%    ROS  Physical Exam  Constitutional: She is oriented to person, place, and time.  Thin   Cardiovascular: Normal rate, regular rhythm and normal heart sounds.  Respiratory: Effort normal and breath sounds normal.  GI: Soft. Bowel sounds are normal.  Musculoskeletal: Normal range of motion. She exhibits no edema.  Neurological: She is alert and oriented to person, place, and time.  Skin: Skin is warm and dry.  Psychiatric: She has a normal mood and affect. Her behavior is normal. Judgment and thought content normal.    Encounter Medications:   Outpatient Encounter Medications as of 09/10/2017  Medication Sig  . albuterol (PROAIR HFA) 108 (90 Base) MCG/ACT inhaler Inhale 2 puffs into the lungs every 4 (four) hours as needed for wheezing or shortness of breath.  Marland Kitchen albuterol (PROVENTIL) (2.5 MG/3ML) 0.083% nebulizer solution Take 3 mLs (2.5 mg total) by nebulization every 4 (four) hours as needed for wheezing or shortness of breath.  Marland Kitchen aspirin EC 81 MG tablet Take 1 tablet (81 mg total) by mouth daily.  . calcium carbonate (OS-CAL - DOSED IN MG OF ELEMENTAL CALCIUM) 1250 (500 Ca) MG tablet Take 1 tablet by mouth 2 (two) times daily with a meal.  . clopidogrel (PLAVIX) 75 MG tablet Take 1 tablet (75 mg total) by mouth daily.  Marland Kitchen denosumab (PROLIA) 60 MG/ML SOLN injection Inject 60 mg into the skin every 6 (six) months. Administer in upper arm, thigh, or  abdomen  . doxycycline (VIBRA-TABS) 100 MG tablet Take 1 tablet (100 mg total) by mouth 2 (two) times daily.  . fluticasone (FLONASE) 50 MCG/ACT nasal spray INSTILL TWO SPRAYS INTO BOTH NOSTRILS DAILY.  . furosemide (LASIX) 20 MG tablet Take 1 tablet (20 mg total) by mouth daily. Take an additional 1/2 tablet daily for swelling.  Marland Kitchen ipratropium-albuterol (DUONEB) 0.5-2.5 (3) MG/3ML SOLN Take 3 mLs by nebulization every 4 (four) hours as needed (wheezing or shortness of breath).  . isosorbide mononitrate (IMDUR) 30 MG 24 hr tablet TAKE 1 TABLET BY MOUTH DAILY  . levofloxacin (LEVAQUIN) 500 MG tablet Take 1 tablet (500 mg total) by mouth daily.  Marland Kitchen loratadine (CLARITIN) 10 MG tablet Take 10 mg by mouth daily as needed for allergies or rhinitis.   . LOTEMAX 0.5 % ophthalmic suspension Place 1 drop into both eyes daily.   . nitroGLYCERIN (NITROSTAT) 0.4 MG SL tablet Place 1 tablet (0.4 mg total) under the tongue every 5 (five) minutes as needed for chest pain.  . pantoprazole (PROTONIX) 40 MG tablet Take 1 tablet (40 mg total) by mouth daily.  . predniSONE (DELTASONE) 10 MG tablet Take 3 tabs by mouth for 3 days, 2 for 3 days, 1 for 3 days and stop  . simvastatin (ZOCOR) 40 MG tablet Take 1 tablet (40 mg total) by mouth at bedtime.  Marland Kitchen Spacer/Aero-Holding Chambers (AEROCHAMBER MV) inhaler  Use as instructed  . spironolactone (ALDACTONE) 25 MG tablet Take 1 tablet (25 mg total) by mouth daily.   No facility-administered encounter medications on file as of 09/10/2017.     Functional Status:   In your present state of health, do you have any difficulty performing the following activities: 09/10/2017 07/07/2017  Hearing? N N  Vision? N N  Comment - -  Difficulty concentrating or making decisions? N N  Walking or climbing stairs? Y N  Dressing or bathing? N N  Doing errands, shopping? N N  Preparing Food and eating ? N -  Using the Toilet? N -  In the past six months, have you accidently leaked urine?  N -  Do you have problems with loss of bowel control? N -  Managing your Medications? N -  Managing your Finances? N -  Housekeeping or managing your Housekeeping? N -  Some recent data might be hidden    Fall/Depression Screening:    Fall Risk  09/10/2017 11/25/2016 03/31/2016  Falls in the past year? No No No  Risk for fall due to : - - -   PHQ 2/9 Scores 09/10/2017 11/25/2016 03/31/2016 03/09/2016 03/09/2016 01/08/2016 04/08/2015  PHQ - 2 Score 0 0 0 0 0 0 0  PHQ- 9 Score - - - 0 - - -    Assessment:  Pleasant 77 year old female, lives alone,niece and brother close by, very supportive.  Referral received from [facility] telephonic RN CM for Community CM services- further in home evaluation/management of chronic diseases.   Pt's history includes but not limited to CAD, Hypertension, CKD, CVA, chronic combined systolic diastolic HF, COPD.     COPD:  Lungs clear.  Sob with exertion/ambulating- per pt chronic.  O 2 sat at rest 90%, after Purse lip breathing 94%.  RN CM provided pt with COPD action plan/reviewed, paying  Close attention to yellow zone (when to call  MD).     Medications:  Reviewed, pt taking as ordered.       Plan:  As discussed with pt, plan to continue to provide Community CM services, follow up again Next month- home visit.            Barrier letter sent to Dr. Danise Mina informing of [facility] involvement.           Plan to send Dr. Danise Mina home visit encounter.   [facility] CM Care Plan Problem One     Most Recent Value  Care Plan Problem One  Self management of COPD   Role Documenting the Problem One  Care Management Coordinator  Care Plan for Problem One  Active  [facility]N Long Term Goal   Pt would have better understanding of COPD action plan in the next 2 days   [facility]N Long Term Goal Start Date  09/10/17  Interventions for Problem One Long Term Goal  Provided pt with information on COPD action plan, discussed yellow zone of when to call MD.   [facility] CM Short Term Goal #1   Pt would take all  medications as ordered for the next 30 days   [facility] CM Short Term Goal #1 Start Date  09/10/17  Interventions for Short Term Goal #1  Medication review completed with pt today, discussed completing antibiotic as ordered.   [facility] CM Short Term Goal #2   Pt would keep all MD appointments in the next 30 days   [facility] CM Short Term Goal #2 Start Date  09/10/17  Interventions for  Short Term Goal #2  Discussed with pt upcoming PET scan, follow visit next month with Heart MD and PA.      [name].   [place] [place] [facility]  [phone]

## 2017-09-11 ENCOUNTER — Encounter: Payer: Self-pay | Admitting: *Deleted

## 2017-09-16 ENCOUNTER — Telehealth: Payer: Self-pay | Admitting: Emergency Medicine

## 2017-09-16 MED ORDER — ALBUTEROL SULFATE HFA 108 (90 BASE) MCG/ACT IN AERS: 2.0000 | INHALATION_SPRAY | RESPIRATORY_TRACT | 5 refills | Status: DC | PRN

## 2017-09-16 NOTE — Telephone Encounter (Signed)
Pt requesting refill on Proair.  This has been sent to preferred pharmacy.  Nothing further needed.

## 2017-09-21 DIAGNOSIS — R911 Solitary pulmonary nodule: Secondary | ICD-10-CM | POA: Diagnosis not present

## 2017-09-21 DIAGNOSIS — E785 Hyperlipidemia, unspecified: Secondary | ICD-10-CM | POA: Diagnosis not present

## 2017-09-21 DIAGNOSIS — J449 Chronic obstructive pulmonary disease, unspecified: Secondary | ICD-10-CM | POA: Diagnosis not present

## 2017-09-21 DIAGNOSIS — R269 Unspecified abnormalities of gait and mobility: Secondary | ICD-10-CM | POA: Diagnosis not present

## 2017-09-24 ENCOUNTER — Ambulatory Visit: Payer: PPO | Admitting: Physician Assistant

## 2017-09-24 ENCOUNTER — Encounter: Payer: Self-pay | Admitting: Physician Assistant

## 2017-09-24 VITALS — BP 100/62 | HR 78 | Ht 66.0 in | Wt 124.8 lb

## 2017-09-24 DIAGNOSIS — I5042 Chronic combined systolic (congestive) and diastolic (congestive) heart failure: Secondary | ICD-10-CM | POA: Diagnosis not present

## 2017-09-24 DIAGNOSIS — J449 Chronic obstructive pulmonary disease, unspecified: Secondary | ICD-10-CM | POA: Diagnosis not present

## 2017-09-24 DIAGNOSIS — I493 Ventricular premature depolarization: Secondary | ICD-10-CM | POA: Diagnosis not present

## 2017-09-24 DIAGNOSIS — I25118 Atherosclerotic heart disease of native coronary artery with other forms of angina pectoris: Secondary | ICD-10-CM | POA: Diagnosis not present

## 2017-09-24 DIAGNOSIS — I10 Essential (primary) hypertension: Secondary | ICD-10-CM

## 2017-09-24 MED ORDER — METOPROLOL SUCCINATE ER 25 MG PO TB24: 12.5000 mg | ORAL_TABLET | Freq: Every day | ORAL | 3 refills | Status: DC

## 2017-09-24 NOTE — Patient Instructions (Addendum)
Medication Instructions:  1. START TOPROL XL 25 MG TABLET WITH THE DIRECTIONS TO READ ; TAKE 1/2 TABLET DAILY = 12.5 MG DAILY  Labwork: NONE ORDERED TODAY  Testing/Procedures: NONE ORDERED TODAY  Follow-Up: DR. Burt Knack ON June 3, 20149 @ 9 AM    Any Other Special Instructions Will Be Listed Below (If Applicable).     If you need a refill on your cardiac medications before your next appointment, please call your pharmacy.

## 2017-09-24 NOTE — Progress Notes (Signed)
Cardiology Office Note:    Date:  09/24/2017   ID:  Jannessa Ogden Cossin, DOB 11-17-1940, MRN [id number]  PCP:  Ria Bush, MD  Cardiologist:  Sherren Mocha, MD   Referring MD: Ria Bush, MD   Chief Complaint  Patient presents with  . Follow-up    CHF, PVCs    History of Present Illness:    Sunshyne Horvath [place]ook[place]bury is a 77 y.o. female with coronary artery disease, systolic heart failure, prior stroke, non-small cell lung cancer status post resection, COPD.  She initially presented with an acute myocardial infarction in 2010 secondary to an occluded left circumflex which was treated with primary PCI with a bare-metal stent.  She had an anterior ST elevation myocardial infarction in July 2017 secondary to an occluded apical LAD which was treated medically.  She has been unable to tolerate beta-blocker or ACE inhibitor secondary to chronic lung disease.  She suffered a non-ST elevation myocardial infarction December 2017.  Cardiac catheterization demonstrated patent stent in the LCx, occlusion of the very distal LAD (old), ruptured plaque in the mid LAD and moderate ostial stenosis in D1.  Patient was treated with DES to the mid LAD.  A Holter monitor in September 2018 did demonstrate sinus rhythm and frequent PVCs (22%) as well as short ventricular runs.  Most recently, she suffered an inferior STEMI December 2018 secondary to an occluded distal apical PDA.  Balloon angioplasty was attempted but unsuccessful. Ticagrelor was stopped in January 2019 secondary to significant dyspnea.  Ms. Chappuis returns for follow-up.  She was last seen 08/31/17.  She was improved on clopidogrel rather than ticagrelor.  She was being treated for acute exacerbation with prednisone and antibiotics.  I opted to hold off on starting a cardioselective beta-blocker until her lung status improved.  Today, she is stable.  Her COPD exacerbation has improved.  Her breathing is more stable.  She denies chest discomfort, PND, edema  or syncope.  Prior CV studies:   The following studies were reviewed today:  Cardiac catheterization 07/06/17 LAD proximal 40, proximal stent patent, distal 100, D1 ostial 80 LCx proximal stent patent RPDA 100 PCI: Unsuccessful POBA  Echo 07/07/17 EF 30-35, global HK with severe inferior HK-AK, mild to moderate MR, moderate LAE, moderate TR, PASP 40  Holter monitor 04/07/17 The basic rhythm is sinus.  There are no significant bradyarrhythmias or pathologic pauses > 3 seconds There are frequent PVC's (22%), as well as short ventricular runs No atrial fibrillation or sustained tachyarrhythmias  Cardiac catheterization 07/09/16 LAD proximal 75, distal 100, D1 ostial 80 LCx stent patent with 10 ISR EF 25-35 PCI: 3 x 8 mm Promus Premier DES to the proximal LAD  Echo 06/28/16 Inferior/inferolateral HK, mild LVH, EF 30-35, mild AI, moderate MR, mild BAE, PASP 39  Echo 03/20/16 EF 40-45, anterolateral and inferolateral hypokinesis, grade 2 diastolic dysfunction, MAC, mild MR, mild LAE, PASP 45  LHC 7/17 LAD proximal 65, distal 100, D1 ostial 65 LCx proximal stent patent with 10% ISR Acute coronary syndrome with occlusion of distal LAD in a small tortuous segment. Widely patent stent in the circumflex. Eccentric 65% stenosis in the mid LAD beyond the first agonal. 60% first diagonal ostial stenosis. Both sites are unchanged from prior angiogram. New finding of very distal LAD total occlusion. Dominant right coronary artery with luminal irregularities. Moderate left ventricular systolic dysfunction with regional wall motion abnormality including new apical severe hypokinesis and suggestion of anterolateral wall akinesis/dyskinesis (old). Estimated ejection fraction is 35%.  LVEDP is low normal.  Echo (12/13):  EF 45-50%, posterior wall akinesis, lateral wall and inferior wall hypokinesis, mild MR  Carotid US (12/13):  No significant ICA stenosis  Nuclear (10/10):  EF  32%, inf-lat scar; no ischemia  LHC (10/10):  Inferolateral akinesis and dyskinesis, EF 40%, D1 30%, LAD 50-60%, mid CFX stent patent, proximal RCA 30%, mid RCA 50%  Past Medical History:  Diagnosis Date  . Arthritis   . CAD (coronary artery disease) 03/2009   s/p MI  . Chronic combined systolic and diastolic heart failure ([place]) 06/17/2009   Qualifier: Diagnosis of  By: Burt Knack, MD, [place] Pert   . CKD (chronic kidney disease) stage 3, GFR 30-59 ml/min (HCC)   . Emphysema/COPD ([place])   . Ex-smoker quit 2008  . GERD (gastroesophageal reflux disease)   . HCAP (healthcare-associated pneumonia) 03/19/2016  . Helicobacter pylori gastritis 10/2007   treated  . History of CVA (cerebrovascular accident) without residual deficits 01/2012, 06/2012   R hemorrhagic MCA 01/2012 with remote lacunar infarct L putamen and IC, rpt 06/2012 acute multifocal R MCA infarct with remote hemorrhagic strokes affecting L basal ganglia and periventricular white matter, full recovery  . HLD (hyperlipidemia)   . HTN (hypertension)   . Ischemic cardiomyopathy 02/27/2014  . Lung cancer ([place]) dx'd 07/2006   Lung CA, s/p resection, followed by Dr Earlie Server  . Macular degeneration   . Myocardial infarction [facility]) 03/2009   Acute myocardial infarction 2010 - treated with BMS of LCx. LVEF 50% with subsequent CHF  . NSTEMI (non-ST elevated myocardial infarction) ([place])   . Osteoporosis 09/2013   T -3.6 forearm 09/2013, T -4.5 forearm 04/2015  . Stroke [facility])     Past Surgical History:  Procedure Laterality Date  . BREAST BIOPSY Left   . CARDIAC CATHETERIZATION N/A 02/24/2016   Procedure: Left Heart Cath and Coronary Angiography;  Surgeon: Belva Crome, MD;  Location: [place] CV LAB;  Service: Cardiovascular;  Laterality: N/A;  . CARDIAC CATHETERIZATION N/A 07/09/2016   Procedure: Left Heart Cath and Coronary Angiography;  Surgeon: Peter M Martinique, MD;  Location: [place] CV LAB;  Service: Cardiovascular;  Laterality:  N/A;  . CARDIAC CATHETERIZATION N/A 07/09/2016   Procedure: Coronary Stent Intervention;  Surgeon: Peter M Martinique, MD;  Location: [place] CV LAB;  Service: Cardiovascular;  Laterality: N/A;  . CATARACT EXTRACTION Bilateral   . CORONARY STENT INTERVENTION N/A 07/06/2017   Procedure: CORONARY STENT INTERVENTION;  Surgeon: Lorretta Harp, MD;  Location: [place] CV LAB;  Service: Cardiovascular;  Laterality: N/A;  . CORONARY STENT PLACEMENT  06/2016   DES to mid LAD  . CORONARY/GRAFT ACUTE MI REVASCULARIZATION N/A 07/06/2017   Procedure: Coronary/Graft Acute MI Revascularization;  Surgeon: Lorretta Harp, MD;  Location: [place] CV LAB;  Service: Cardiovascular;  Laterality: N/A;  . EYE SURGERY    . LEFT HEART CATH AND CORONARY ANGIOGRAPHY N/A 07/06/2017   Procedure: LEFT HEART CATH AND CORONARY ANGIOGRAPHY;  Surgeon: Lorretta Harp, MD;  Location: [place] CV LAB;  Service: Cardiovascular;  Laterality: N/A;  . LUNG REMOVAL, PARTIAL Right 2008  . PARTIAL HYSTERECTOMY  1986   irregular periods, ovaries remain    Current Medications: Current Meds  Medication Sig  . albuterol (PROAIR HFA) 108 (90 Base) MCG/ACT inhaler Inhale 2 puffs into the lungs every 4 (four) hours as needed for wheezing or shortness of breath.  Marland Kitchen albuterol (PROVENTIL) (2.5 MG/3ML) 0.083% nebulizer solution Take 3 mLs (2.5 mg  total) by nebulization every 4 (four) hours as needed for wheezing or shortness of breath.  Marland Kitchen aspirin EC 81 MG tablet Take 1 tablet (81 mg total) by mouth daily.  . calcium carbonate (OS-CAL - DOSED IN MG OF ELEMENTAL CALCIUM) 1250 (500 Ca) MG tablet Take 1 tablet by mouth 2 (two) times daily with a meal.  . clopidogrel (PLAVIX) 75 MG tablet Take 1 tablet (75 mg total) by mouth daily.  Marland Kitchen denosumab (PROLIA) 60 MG/ML SOLN injection Inject 60 mg into the skin every 6 (six) months. Administer in upper arm, thigh, or abdomen  . doxycycline (VIBRA-TABS) 100 MG tablet Take 1 tablet (100 mg  total) by mouth 2 (two) times daily.  . fluticasone (FLONASE) 50 MCG/ACT nasal spray INSTILL TWO SPRAYS INTO BOTH NOSTRILS DAILY.  . furosemide (LASIX) 20 MG tablet Take 1 tablet (20 mg total) by mouth daily. Take an additional 1/2 tablet daily for swelling.  Marland Kitchen ipratropium-albuterol (DUONEB) 0.5-2.5 (3) MG/3ML SOLN Take 3 mLs by nebulization every 4 (four) hours as needed (wheezing or shortness of breath).  . isosorbide mononitrate (IMDUR) 30 MG 24 hr tablet TAKE 1 TABLET BY MOUTH DAILY  . levofloxacin (LEVAQUIN) 500 MG tablet Take 1 tablet (500 mg total) by mouth daily.  Marland Kitchen loratadine (CLARITIN) 10 MG tablet Take 10 mg by mouth daily as needed for allergies or rhinitis.   . LOTEMAX 0.5 % ophthalmic suspension Place 1 drop into both eyes daily.   . nitroGLYCERIN (NITROSTAT) 0.4 MG SL tablet Place 1 tablet (0.4 mg total) under the tongue every 5 (five) minutes as needed for chest pain.  . pantoprazole (PROTONIX) 40 MG tablet Take 1 tablet (40 mg total) by mouth daily.  . simvastatin (ZOCOR) 40 MG tablet Take 1 tablet (40 mg total) by mouth at bedtime.  Marland Kitchen Spacer/Aero-Holding Chambers (AEROCHAMBER MV) inhaler Use as instructed  . spironolactone (ALDACTONE) 25 MG tablet Take 1 tablet (25 mg total) by mouth daily.     Allergies:   Actonel [risedronate sodium]; Amlodipine; Brilinta [ticagrelor]; Codeine; Fosamax [alendronate sodium]; Lisinopril; and Pravastatin   Social History   Tobacco Use  . Smoking status: Former Smoker    Types: Cigarettes    Last attempt to quit: 07/27/2002    Years since quitting: 15.1  . Smokeless tobacco: Never Used  Substance Use Topics  . Alcohol use: No    Alcohol/week: 0.0 oz  . Drug use: No     Family Hx: The patient's family history includes Breast cancer in her sister; CAD (age of onset: 38) in her father and mother; Diabetes in her sister; Heart attack in her mother; Hypertension in her mother; Stroke in her father and mother.  ROS:   Please see the history  of present illness.    ROS All other systems reviewed and are negative.   EKGs/Labs/Other Test Reviewed:    EKG:  EKG is not ordered today.    Recent Labs: 07/08/2017: Hemoglobin 12.6; Platelets 240 07/09/2017: B Natriuretic Peptide 460.0; BUN 15; Creatinine, Ser 0.98; Potassium 3.5; Sodium 136   Recent Lipid Panel Lab Results  Component Value Date/Time   CHOL 129 07/08/2017 03:50 AM   TRIG 47 07/08/2017 03:50 AM   HDL 66 07/08/2017 03:50 AM   CHOLHDL 2.0 07/08/2017 03:50 AM   LDLCALC 54 07/08/2017 03:50 AM   LDLDIRECT 50.0 11/25/2016 11:31 AM    Physical Exam:    VS:  BP 100/62   Pulse 78   Ht 5' 6"  (1.676 m)  Wt 124 lb 12.8 oz (56.6 kg)   SpO2 92%   BMI 20.14 kg/m     Wt Readings from Last 3 Encounters:  09/24/17 124 lb 12.8 oz (56.6 kg)  09/10/17 119 lb (54 kg)  08/31/17 122 lb 1.9 oz (55.4 kg)     Physical Exam  Constitutional: She is oriented to person, place, and time. She appears well-developed and well-nourished. No distress.  HENT:  Head: Normocephalic and atraumatic.  Neck: No JVD present.  Cardiovascular: Normal rate. A regularly irregular rhythm present.  No murmur heard. Pulmonary/Chest: She has decreased breath sounds. She has no wheezes. She has no rales.  Abdominal: Soft.  Musculoskeletal: She exhibits no edema.  Neurological: She is alert and oriented to person, place, and time.  Skin: Skin is warm and dry.    ASSESSMENT & PLAN:    #1.  PVC's (premature ventricular contractions) Her COPD exacerbation has resolved.  We discussed indications for attempting beta-blocker therapy.  At this point, I think she can tolerate low-dose cardioselective beta-blocker.  -Start metoprolol succinate 12.5 mg daily  #2.  Coronary artery disease She has had multiple heart attacks and PCI's in the past.  Most recent myocardial infarction in December 2018 with unsuccessful attempt at balloon angioplasty of the distal PDA.  She has stable angina that is  currently controlled on aspirin, Plavix, nitrates.  Continue statin.  #3.  Chronic combined systolic and diastolic heart failure (HCC) NYHA 3.  Dyspnea is multifactorial and related to CAD, CHF and chronic lung disease.  EF 30-35 by echocardiogram December 2018.  Volume status stable.  He has been intolerant to ace inhibitors and ARB's in the past.  Attempt resuming beta-blocker as outlined above.  Continue nitrates, spironolactone.  #4.  Essential hypertension The patient's blood pressure is controlled on her current regimen.  Continue current therapy.   #5.  Chronic obstructive pulmonary disease, unspecified COPD type ([place]) Continue follow-up with pulmonology as planned.    Dispo:  Return in about 3 months (around 12/25/2017) for Routine Follow Up, w/ Dr. Burt Knack.   Medication Adjustments/Labs and Tests Ordered: Current medicines are reviewed at length with the patient today.  Concerns regarding medicines are outlined above.  Tests Ordered: No orders of the defined types were placed in this encounter.  Medication Changes: Meds ordered this encounter  Medications  . metoprolol succinate (TOPROL XL) 25 MG 24 hr tablet    Sig: Take 0.5 tablets (12.5 mg total) by mouth daily.    Dispense:  90 tablet    Refill:  3    Signed, Richardson Dopp, PA-C  09/24/2017 10:31 AM    [facility] [place], [place], [place]  [address] Phone: [phone]; Fax: [phone]

## 2017-09-30 ENCOUNTER — Telehealth: Payer: Self-pay | Admitting: Emergency Medicine

## 2017-09-30 DIAGNOSIS — H26493 Other secondary cataract, bilateral: Secondary | ICD-10-CM | POA: Diagnosis not present

## 2017-09-30 DIAGNOSIS — Z947 Corneal transplant status: Secondary | ICD-10-CM | POA: Diagnosis not present

## 2017-09-30 MED ORDER — PREDNISONE 10 MG PO TABS: ORAL_TABLET | ORAL | 1 refills | Status: DC

## 2017-09-30 NOTE — Telephone Encounter (Signed)
Pt states she has developed prod cough with white mucus & wheezing x2d Denies fever, chills, sweats or body aches.  Sob is baseline. Doing albuterol neb treatment qid with mild relief.   RB please advise. Thanks

## 2017-09-30 NOTE — Telephone Encounter (Signed)
Pt is aware of below recommendations and voiced her understanding. Rx for prednisone has been sent to preferred pharmacy. Nothing further is needed. 

## 2017-09-30 NOTE — Telephone Encounter (Signed)
Please have her start prednisone 20 mg daily for the next 7 days, then decrease to 10 mg daily and stay on this dose until we follow-up

## 2017-10-01 ENCOUNTER — Telehealth: Payer: Self-pay | Admitting: Emergency Medicine

## 2017-10-01 NOTE — Telephone Encounter (Signed)
Pt states she remembered last night that  she has had an reaction to prednisone previously.  Pt states prednisone caused shaking severe shaking. Prednisone is not listed on pt's allergies. Pt states she has not started prednisone and is requesting an alternative.  RB please advise. Thanks  Allergies  Allergen Reactions  . Actonel [Risedronate Sodium] Other (See Comments)    Headache  . Amlodipine Other (See Comments)    Pedal edema  . Brilinta [Ticagrelor] Other (See Comments)    Worsening dyspnea, malaise  . Codeine Rash  . Fosamax [Alendronate Sodium] Other (See Comments)    Unable to tolerate  . Lisinopril Cough  . Pravastatin Other (See Comments)    Constipation.

## 2017-10-01 NOTE — Telephone Encounter (Signed)
Per RB verbally-recommends not taking prednisone due to previous shaking.  There is no good alternative to prednisone that will not have the same side effects.  I have added prednisone to pt's allergies. RB recommends trying nasal saline rinses.  Pt voiced her understanding and had no further questions. Nothing further is needed at this time.

## 2017-10-04 ENCOUNTER — Other Ambulatory Visit: Payer: Self-pay | Admitting: Emergency Medicine

## 2017-10-04 ENCOUNTER — Other Ambulatory Visit: Payer: Self-pay | Admitting: Cardiovascular Disease

## 2017-10-08 ENCOUNTER — Telehealth: Payer: Self-pay | Admitting: Emergency Medicine

## 2017-10-08 NOTE — Telephone Encounter (Signed)
Spoke with pt. States that she is still having issues with coughing. Cough is producing mucus. Advised her that since we have tried to treat this over the phone over the past few weeks, she needs an appointment. Pt has been scheduled to see Dr. Lamonte Sakai on 10/11/17 at 1:30pm. Nothing further was needed.

## 2017-10-11 ENCOUNTER — Ambulatory Visit: Payer: PPO | Admitting: Emergency Medicine

## 2017-10-11 ENCOUNTER — Other Ambulatory Visit: Payer: Self-pay | Admitting: *Deleted

## 2017-10-11 NOTE — Patient Outreach (Signed)
[facility]) Care Management   10/11/2017  Savreen Gebhardt Wik 07-Sep-1940 [id number]  Cicilia Clinger Stoffer is an 77 y.o. female  Subjective:  Pt reports started with a cough 2 weeks ago, increased sob/ got  Worse so called Dr. Agustina Caroli office on 3/7, Prednisone was called in.  Pt reports she  Started the medication on 3/8, took as ordered/continues to take, feeling good today. Pt reports she called MD office again 3/15, was told could follow up with MD today but  not aware appointment time was set until RN CM informed her.  Pt  reports she is getting better, cough broke loose, mucous Started getting clear yesterday, no wheezing. Pt reports scheduled to see Dr. Lamonte Sakai 4/12, has not had her PET scan yet.   Pt reports on recent visit with Richardson Dopp PA, To follow up again in June.     Objective:   Vitals:   10/11/17 1411  BP: (!) 104/52  Pulse: 64  Resp: (!) 24  SpO2: 96%    ROS  Physical Exam  Constitutional: She is oriented to person, place, and time. She appears well-developed and well-nourished.  Cardiovascular:  Slightly irregular   Respiratory: Effort normal and breath sounds normal.  GI: Soft.  Musculoskeletal: Normal range of motion. She exhibits no edema.  Neurological: She is alert and oriented to person, place, and time.  Skin: Skin is warm and dry.  Psychiatric: She has a normal mood and affect. Her behavior is normal. Judgment and thought content normal.    Encounter Medications:   Outpatient Encounter Medications as of 10/11/2017  Medication Sig Note  . albuterol (PROAIR HFA) 108 (90 Base) MCG/ACT inhaler Inhale 2 puffs into the lungs every 4 (four) hours as needed for wheezing or shortness of breath.   Marland Kitchen aspirin EC 81 MG tablet Take 1 tablet (81 mg total) by mouth daily.   . calcium carbonate (OS-CAL - DOSED IN MG OF ELEMENTAL CALCIUM) 1250 (500 Ca) MG tablet Take 1 tablet by mouth 2 (two) times daily with a meal.   . clopidogrel (PLAVIX) 75 MG tablet  Take 1 tablet (75 mg total) by mouth daily.   Marland Kitchen denosumab (PROLIA) 60 MG/ML SOLN injection Inject 60 mg into the skin every 6 (six) months. Administer in upper arm, thigh, or abdomen 10/11/2017: Per pt done at PCP office every 6 months   . fluticasone (FLONASE) 50 MCG/ACT nasal spray INSTILL TWO SPRAYS INTO BOTH NOSTRILS DAILY.   . furosemide (LASIX) 20 MG tablet Take 1 tablet (20 mg total) by mouth daily. Take an additional 1/2 tablet daily for swelling.   Marland Kitchen ipratropium-albuterol (DUONEB) 0.5-2.5 (3) MG/3ML SOLN INHALE 1 VIAL VIA NEBULIZER FOUR TIMES ADAY AND EVERY 4 HOURS AS NEEDED AS DIRECTED   . isosorbide mononitrate (IMDUR) 30 MG 24 hr tablet TAKE 1 TABLET BY MOUTH DAILY   . loratadine (CLARITIN) 10 MG tablet Take 10 mg by mouth daily as needed for allergies or rhinitis.    . metoprolol succinate (TOPROL XL) 25 MG 24 hr tablet Take 0.5 tablets (12.5 mg total) by mouth daily.   . nitroGLYCERIN (NITROSTAT) 0.4 MG SL tablet Place 1 tablet (0.4 mg total) under the tongue every 5 (five) minutes as needed for chest pain. 10/11/2017: As needed.   . pantoprazole (PROTONIX) 40 MG tablet TAKE 1 TABLET BY MOUTH ONCE DAILY   . prednisoLONE acetate (PRED FORTE) 1 % ophthalmic suspension 1 drop daily. Both eyes   . predniSONE (DELTASONE) 10  MG tablet Take 10 mg by mouth daily with breakfast.   . simvastatin (ZOCOR) 40 MG tablet TAKE 1 TABLET BY MOUTH AT BEDTIME   . Spacer/Aero-Holding Chambers (AEROCHAMBER MV) inhaler Use as instructed   . spironolactone (ALDACTONE) 25 MG tablet Take 1 tablet (25 mg total) by mouth daily.   Marland Kitchen albuterol (PROVENTIL) (2.5 MG/3ML) 0.083% nebulizer solution Take 3 mLs (2.5 mg total) by nebulization every 4 (four) hours as needed for wheezing or shortness of breath. (Patient not taking: Reported on 10/11/2017)   . doxycycline (VIBRA-TABS) 100 MG tablet Take 1 tablet (100 mg total) by mouth 2 (two) times daily. (Patient not taking: Reported on 10/11/2017)   . levofloxacin (LEVAQUIN)  500 MG tablet Take 1 tablet (500 mg total) by mouth daily. (Patient not taking: Reported on 10/11/2017)   . LOTEMAX 0.5 % ophthalmic suspension Place 1 drop into both eyes daily.     No facility-administered encounter medications on file as of 10/11/2017.     Functional Status:   In your present state of health, do you have any difficulty performing the following activities: 09/10/2017 07/07/2017  Hearing? N N  Vision? N N  Comment - -  Difficulty concentrating or making decisions? N N  Walking or climbing stairs? Y N  Dressing or bathing? N N  Doing errands, shopping? N N  Preparing Food and eating ? N -  Using the Toilet? N -  In the past six months, have you accidently leaked urine? N -  Do you have problems with loss of bowel control? N -  Managing your Medications? N -  Managing your Finances? N -  Housekeeping or managing your Housekeeping? N -  Some recent data might be hidden    Fall/Depression Screening:    Fall Risk  09/10/2017 11/25/2016 03/31/2016  Falls in the past year? No No No  Risk for fall due to : - - -   PHQ 2/9 Scores 09/10/2017 11/25/2016 03/31/2016 03/09/2016 03/09/2016 01/08/2016 04/08/2015  PHQ - 2 Score 0 0 0 0 0 0 0  PHQ- 9 Score - - - 0 - - -    Assessment:  Pleasant 77 year old female.lives alone, niece and brother close by, assist as  Needed.    Pt's history includes but not limited to CAD, CKD, CVA, chronic combined systolic Diastolic HF, COPD.     COPD: lungs clear, no observed or reported sob.  O 2 sat at rest 96%.     CAD: no complaints of chest pain.   HR- auscultated slight irregularity.  Per pt- started on       Metoprolol 12.5 mg after recent Heart MD's PA visit, taking as ordered.    HF:  No edema, lungs clear.    BP: 104/52, no complaints of dizziness, staying hydrated.     Plan:  As discussed with pt, will continue to provide Community CM services, follow up again      Next month- home visit.   [facility] CM Care Plan Problem One     Most Recent Value   Care Plan Problem One  Self management of COPD   Role Documenting the Problem One  Care Management Coordinator  Care Plan for Problem One  Active  [facility]N Long Term Goal   Pt would have better understanding of COPD action plan in the next 20 days   [facility] Long Term Goal Start Date  09/10/17  Interventions for Problem One Long Term Goal  Commended pt for contacting  PCP related to symptoms in the yellow zone  [facility] CM Short Term Goal #1   Pt would take all medications as ordered for the next 30 days   [facility] CM Short Term Goal #1 Start Date  09/10/17  Interventions for Short Term Goal #1  Discussed with pt ongoing medications adherence,med profile updated to include Prednisone and Pred Forte  THN CM Short Term Goal #2   Pt would keep all MD appointments in the next 30 days   [facility] CM Short Term Goal #2 Start Date  09/10/17  Interventions for Short Term Goal #2  Discussed with pt recent visit with Heart MD's PA, missed acute visit with Lung MD today, PET scan not done yet.      [name].   [facility]  [phone]

## 2017-10-13 ENCOUNTER — Telehealth: Payer: Self-pay | Admitting: Family Medicine

## 2017-10-13 NOTE — Telephone Encounter (Signed)
Pt dropped off form to be signed. Placed in [place] tower.

## 2017-10-13 NOTE — Telephone Encounter (Signed)
Placed form in Dr. G's box.  

## 2017-10-13 NOTE — Telephone Encounter (Signed)
Signed and placed in my outbox.

## 2017-10-14 NOTE — Telephone Encounter (Signed)
Left message on vm per dpr notifying pt her form is ready to pick up.  [Placed form at front office.]

## 2017-10-19 DIAGNOSIS — R269 Unspecified abnormalities of gait and mobility: Secondary | ICD-10-CM | POA: Diagnosis not present

## 2017-10-19 DIAGNOSIS — E785 Hyperlipidemia, unspecified: Secondary | ICD-10-CM | POA: Diagnosis not present

## 2017-10-19 DIAGNOSIS — J449 Chronic obstructive pulmonary disease, unspecified: Secondary | ICD-10-CM | POA: Diagnosis not present

## 2017-10-19 DIAGNOSIS — R911 Solitary pulmonary nodule: Secondary | ICD-10-CM | POA: Diagnosis not present

## 2017-11-04 ENCOUNTER — Telehealth: Payer: Self-pay | Admitting: *Deleted

## 2017-11-04 NOTE — Telephone Encounter (Signed)
Spoke to pt and advised check received for her [place] pt assistance program. Copy made and placed in pts chart in the event it is needed by billing

## 2017-11-05 ENCOUNTER — Ambulatory Visit: Payer: PPO | Admitting: Emergency Medicine

## 2017-11-11 ENCOUNTER — Other Ambulatory Visit: Payer: Self-pay | Admitting: *Deleted

## 2017-11-11 NOTE — Patient Outreach (Signed)
[name]) Care Management   11/11/2017  [name] 1941/07/27 [id number]  [name] is an 77 y.o. female  Subjective: Pt reports been dealing with a cold past 2-3 days, sinuses, getting worse,  Could not breath this am, did nebulizer treatment which helped plus will use oxygen as  Needed.   Pt reports she is careful what  she does, has been avoiding going outdoors because of the pollen.  Pt reports she has been staying hydrated.  Pt reports she did  Not follow up with Dr. Lamonte [name] as their office called,rescheduled visit for 11/23/17.    Objective:   Vitals:   11/11/17 1259 11/11/17 1343  BP: (!) 106/48 (!) 109/53  Pulse: 75 72  Resp: (!) 24   SpO2: 95%     ROS  Physical Exam  Constitutional: She is oriented to person, place, and time. She appears well-developed and well-nourished.  Cardiovascular: Normal rate and regular rhythm.  Irregular rhythm   Respiratory: Effort normal and breath sounds normal.  GI: Soft. Bowel sounds are normal.  Neurological: She is alert and oriented to person, place, and time.  Skin: Skin is dry.  Hands cold   Psychiatric: She has a normal mood and affect. Her behavior is normal. Judgment and thought content normal.    Encounter Medications:   Outpatient Encounter Medications as of 11/11/2017  Medication Sig Note  . albuterol (PROAIR HFA) 108 (90 Base) MCG/ACT inhaler Inhale 2 puffs into the lungs every 4 (four) hours as needed for wheezing or shortness of breath.   Marland Kitchen albuterol (PROVENTIL) (2.5 MG/3ML) 0.083% nebulizer solution Take 3 mLs (2.5 mg total) by nebulization every 4 (four) hours as needed for wheezing or shortness of breath.   Marland Kitchen aspirin EC 81 MG tablet Take 1 tablet (81 mg total) by mouth daily.   . calcium carbonate (OS-CAL - DOSED IN MG OF ELEMENTAL CALCIUM) 1250 (500 Ca) MG tablet Take 1 tablet by mouth 2 (two) times daily with a meal.   . clopidogrel (PLAVIX) 75 MG tablet Take 1 tablet (75 mg total) by mouth  daily.   . fluticasone (FLONASE) 50 MCG/ACT nasal spray INSTILL TWO SPRAYS INTO BOTH NOSTRILS DAILY.   . furosemide (LASIX) 20 MG tablet Take 1 tablet (20 mg total) by mouth daily. Take an additional 1/2 tablet daily for swelling.   Marland Kitchen ipratropium-albuterol (DUONEB) 0.5-2.5 (3) MG/3ML SOLN INHALE 1 VIAL VIA NEBULIZER FOUR TIMES ADAY AND EVERY 4 HOURS AS NEEDED AS DIRECTED   . isosorbide mononitrate (IMDUR) 30 MG 24 hr tablet TAKE 1 TABLET BY MOUTH DAILY   . loratadine (CLARITIN) 10 MG tablet Take 10 mg by mouth daily as needed for allergies or rhinitis.    . metoprolol succinate (TOPROL XL) 25 MG 24 hr tablet Take 0.5 tablets (12.5 mg total) by mouth daily.   . nitroGLYCERIN (NITROSTAT) 0.4 MG SL tablet Place 1 tablet (0.4 mg total) under the tongue every 5 (five) minutes as needed for chest pain. 11/11/2017: As needed.   . pantoprazole (PROTONIX) 40 MG tablet TAKE 1 TABLET BY MOUTH ONCE DAILY   . prednisoLONE acetate (PRED FORTE) 1 % ophthalmic suspension 1 drop daily. Both eyes   . simvastatin (ZOCOR) 40 MG tablet TAKE 1 TABLET BY MOUTH AT BEDTIME   . Spacer/Aero-Holding Chambers (AEROCHAMBER MV) inhaler Use as instructed   . spironolactone (ALDACTONE) 25 MG tablet Take 1 tablet (25 mg total) by mouth daily.   Marland Kitchen denosumab (PROLIA) 60 MG/ML SOLN  injection Inject 60 mg into the skin every 6 (six) months. Administer in upper arm, thigh, or abdomen 10/11/2017: Per pt done at PCP office every 6 months   . doxycycline (VIBRA-TABS) 100 MG tablet Take 1 tablet (100 mg total) by mouth 2 (two) times daily. (Patient not taking: Reported on 10/11/2017)   . levofloxacin (LEVAQUIN) 500 MG tablet Take 1 tablet (500 mg total) by mouth daily. (Patient not taking: Reported on 10/11/2017)   . LOTEMAX 0.5 % ophthalmic suspension Place 1 drop into both eyes daily.    . predniSONE (DELTASONE) 10 MG tablet Take 10 mg by mouth daily with breakfast.    No facility-administered encounter medications on file as of 11/11/2017.      Functional Status:   In your present state of health, do you have any difficulty performing the following activities: 09/10/2017 07/07/2017  Hearing? N N  Vision? N N  Comment - -  Difficulty concentrating or making decisions? N N  Walking or climbing stairs? Y N  Dressing or bathing? N N  Doing errands, shopping? N N  Preparing Food and eating ? N -  Using the Toilet? N -  In the past six months, have you accidently leaked urine? N -  Do you have problems with loss of bowel control? N -  Managing your Medications? N -  Managing your Finances? N -  Housekeeping or managing your Housekeeping? N -  Some recent data might be hidden    Fall/Depression Screening:    Fall Risk  09/10/2017 11/25/2016 03/31/2016  Falls in the past year? No No No  Risk for fall due to : - - -   PHQ 2/9 Scores 09/10/2017 11/25/2016 03/31/2016 03/09/2016 03/09/2016 01/08/2016 04/08/2015  PHQ - 2 Score 0 0 0 0 0 0 0  PHQ- 9 Score - - - 0 - - -    Assessment:  Pleasant 77 year old female, lives alone. Pt's history includes but not limited to  HF, CAD, Hyperlipidemia, CVA, GERD.      COPD:  Lungs clear, O 2 sat at rest 95%.  Sob noted after walking back from answering the door.       Discussed with pt purchasing a pulse ox, benefit of having one.   Low BP:  BP today 106/48 LA nurse's cuff, 109/53 LA pt's BP machine.  Reinforced with pt        Importance of ongoing hydration.  No dizziness reported.      Plan: As discussed with pt, plan to follow up again next month telephonically- discharge if  Remain stable.    [facility] CM Care Plan Problem One     Most Recent Value  Care Plan Problem One  Self management of COPD   Role Documenting the Problem One  Care Management Coordinator  Care Plan for Problem One  Active  [facility] Long Term Goal   Pt would have better understanding of COPD action plan in the next 81 days   [facility] Long Term Goal Start Date  09/10/17  [facility] Long Term Goal Met Date  11/11/17  Interventions for Problem  One Long Term Goal  Reinforced continuing to familarize herself with yellow zone- when to call MD     [facility] CM Care Plan Problem Two     Most Recent Value  Care Plan Problem Two  COPD - ongoing self management   Role Documenting the Problem Two  Care Management Coordinator  Care Plan for Problem Two  Active  Interventions for Problem Two Long Term Goal   Discussed with pt reviewing COPD zones, ongoing use of respiratory medications/oxygen as needed.   [facility] Long Term Goal  Pt would have no issues with self management in the next 31 days   [facility] Long Term Goal Start Date  11/11/17     [name].   [place] Care Management  [phone]

## 2017-11-19 DIAGNOSIS — R269 Unspecified abnormalities of gait and mobility: Secondary | ICD-10-CM | POA: Diagnosis not present

## 2017-11-19 DIAGNOSIS — R911 Solitary pulmonary nodule: Secondary | ICD-10-CM | POA: Diagnosis not present

## 2017-11-19 DIAGNOSIS — E785 Hyperlipidemia, unspecified: Secondary | ICD-10-CM | POA: Diagnosis not present

## 2017-11-19 DIAGNOSIS — J449 Chronic obstructive pulmonary disease, unspecified: Secondary | ICD-10-CM | POA: Diagnosis not present

## 2017-11-23 ENCOUNTER — Encounter: Payer: Self-pay | Admitting: Emergency Medicine

## 2017-11-23 ENCOUNTER — Ambulatory Visit: Payer: PPO | Admitting: Emergency Medicine

## 2017-11-23 DIAGNOSIS — J9611 Chronic respiratory failure with hypoxia: Secondary | ICD-10-CM

## 2017-11-23 DIAGNOSIS — J449 Chronic obstructive pulmonary disease, unspecified: Secondary | ICD-10-CM

## 2017-11-23 DIAGNOSIS — R918 Other nonspecific abnormal finding of lung field: Secondary | ICD-10-CM | POA: Diagnosis not present

## 2017-11-23 DIAGNOSIS — C3431 Malignant neoplasm of lower lobe, right bronchus or lung: Secondary | ICD-10-CM

## 2017-11-23 DIAGNOSIS — J301 Allergic rhinitis due to pollen: Secondary | ICD-10-CM | POA: Diagnosis not present

## 2017-11-23 NOTE — Patient Instructions (Signed)
Please continue your DuoNeb every 4-6 hours as you have been taking it.  You would benefit from wearing your oxygen to perform exertion like walking around. Try using it when you exert, before you become fatigued or short of breath.  Please discuss your metoprolol dosing and your symptoms with Cardiology.  We will perform your PET scan in July as we planned.  Follow with Dr Lamonte Sakai in July after the PET scan to review the results together.

## 2017-11-23 NOTE — Assessment & Plan Note (Signed)
Continue flonase and loratadine

## 2017-11-23 NOTE — Assessment & Plan Note (Signed)
Following new pulm nodules as above

## 2017-11-23 NOTE — Progress Notes (Signed)
Subjective:    Patient ID: [name], female    DOB: 04-Feb-1941, 77 y.o.   MRN: [id number]  HPI  ROV 06/10/17 --77 year old woman with a history of COPD and a right lower lobe superior segmental segmentectomy for adenocarcinoma of the lung.  She has pulmonary nodules that we have followed with serial CT scans, most recent showed an index nodule that had decreased in size so biopsy was deferred.  Her bronchodilators currently include DuoNeb 4x a day. She has had some nasal congestion, hoarse voice last 2-3 days. She has albuterol HFA, uses rarely.  She is on loratadine prn, uses flonase daily. Flu shot is up to date.   ROV 08/20/17 --this is a follow-up visit for 77 year old woman with COPD and history of adenocarcinoma of the lung status post right lower lobe superior segmental segmentectomy.  We have been following a right lower lobe and a left lower lobe pulmonary nodule with serial CT scans.  She also had a PET scan 12/22/16 that showed some mild hypermetabolism in these areas.  We had arranged for a needle biopsy of the left lower lobe nodule to be done 01/05/17 but the left lower lobe nodule was smaller and had decreasing nodularity so the procedure was deferred.  She returns now after a repeat scan done on 07/02/17 that I have reviewed.  Again this shows moderate emphysema and a spiculated right lower lobe nodule abutting the pleura, measures 2.8 x 1.3 cm which is slightly larger than 11/30/16.  The spiculated left lower lobe nodule now measures 1.9 x 1.4 cm, stable from June but larger than studies dating back to 2017.  Both locations are suspicious.   She is having more dyspnea lately, seems to benefit from her albuterol/   We spent 40 minutes of a 50-minute appointment discussing her CT scans, reviewing results, discussing options regarding biopsy versus watchful waiting  ROV 11/23/17 --[name] has a history of COPD, adenocarcinoma of the lung status post right lower lobe superior segmental  segmentectomy, pulmonary nodules that we have followed with serial imaging.  As above her most recent imaging shows a slight increase in size of a right lower lobe pleural-based nodule, stable left lower lobe nodule (but enlarging compared with 2017). We were going to bx the LLL nodule but it had decreased in size so the bx was not done. Several call from her since last time, worsening sx of cough, fatigue, dyspnea. She was given prednisone, doxycycline. She has significant fatigue, weakness, ? Since her b-blocker was added back earlier this year. Her cough is a bit better. She is on loratadine and flonase during the warm months. Off prednisone now. She is on duoneb q4h on a schedule.    Review of Systems     As per history of present illness    Objective:   Physical Exam Vitals:   11/23/17 1204  BP: 126/72  Pulse: (!) 44  SpO2: 97%  Weight: 126 lb (57.2 kg)  Height: 5\' 6"  (1.676 m)    Gen: Anxious, elderly woman, in mild resp distress  ENT: No lesions,  mouth clear,  oropharynx clear, no postnasal drip, weak hoarse voice  Neck: No JVD, no stridor  Lungs: tachypneic, small breaths but no wheezing. No wheeze on forced exp  Cardiovascular: RRR, heart sounds normal, no murmur or gallops, no peripheral edema. Her HR on my recheck was in the 60's  Musculoskeletal: No deformities, no cyanosis or clubbing  Neuro: alert, non focal  Skin: Warm,  no lesions or rashes      Assessment & Plan:  COPD (chronic obstructive pulmonary disease) ([place]) Managing with scheduled DuoNeb.  Improved compared with the last couple months.  We will continue the same regimen.  Pulmonary nodules She has pulmonary nodules that we have followed with serial scanning.  Both are suspicious especially in a woman who has a history of lung cancer.  Given her overall functional capacity we decided to wait, watch and follow her PET scan in July.  We will review after the PET scan is performed.  Cancer of lower lobe  of right lung (HCC) Following new pulm nodules as above  Allergic rhinitis Continue flonase and loratadine  Chronic respiratory failure with hypoxia [facility]) Detailed discussion with her today regarding her oxygen use.  She is using it as a rescue medicine after she becomes fatigued and dyspneic.  I have discussed this with her in the past and tried to underscore today that she would benefit from using it continuously, certainly at least during times of exertion.  This probably is at least part of her weakness, fatigue.  She does not describe any significant barriers-she has a portable concentrator, it is not too heavy, she does not really feel any stigma of wearing it.  She does tell me that she "does not want to be dependent on it".  Again I tried to explain the benefits of using it as directed.  She states that she will try and that we can go over how she is doing with more reliable use in July when we follow-up.  Baltazar Apo, MD, PhD 11/23/2017, 12:46 PM [facility] [phone] or if no answer [phone]

## 2017-11-23 NOTE — Assessment & Plan Note (Signed)
Detailed discussion with her today regarding her oxygen use.  She is using it as a rescue medicine after she becomes fatigued and dyspneic.  I have discussed this with her in the past and tried to underscore today that she would benefit from using it continuously, certainly at least during times of exertion.  This probably is at least part of her weakness, fatigue.  She does not describe any significant barriers-she has a portable concentrator, it is not too heavy, she does not really feel any stigma of wearing it.  She does tell me that she "does not want to be dependent on it".  Again I tried to explain the benefits of using it as directed.  She states that she will try and that we can go over how she is doing with more reliable use in July when we follow-up.

## 2017-11-23 NOTE — Assessment & Plan Note (Signed)
She has pulmonary nodules that we have followed with serial scanning.  Both are suspicious especially in a woman who has a history of lung cancer.  Given her overall functional capacity we decided to wait, watch and follow her PET scan in July.  We will review after the PET scan is performed.

## 2017-11-23 NOTE — Assessment & Plan Note (Signed)
Managing with scheduled DuoNeb.  Improved compared with the last couple months.  We will continue the same regimen.

## 2017-11-24 ENCOUNTER — Telehealth: Payer: Self-pay | Admitting: Cardiovascular Disease

## 2017-11-24 NOTE — Telephone Encounter (Signed)
Scheduled patient Monday, 5/6 for EKG. She was grateful for call and agrees with treatment plan.

## 2017-11-24 NOTE — Telephone Encounter (Signed)
Patient reports she went to Dr. Lamonte Sakai yesterday. Her HR was 44. She states she has been extremely fatigued since starting Toprol 3/1. She's "tired all the time and can't do nothing." She checks her BP and HR daily. Her HR is usually right around 60 bpm. Instructed her to STOP TOPROL (she is currently taking 12.5 mg daily).  She will continue to monitor HR and BP and call if symptoms do not improve prior to appointment with Dr. Burt Knack 6/3. She understands she will be called if there are further recommendations prior to that time.

## 2017-11-24 NOTE — Telephone Encounter (Signed)
Agree with stopping Metoprolol succinate. Let's have her come in for an ECG in 2-3 days to recheck her HR. Richardson Dopp, PA-C    11/24/2017 3:20 PM

## 2017-11-24 NOTE — Telephone Encounter (Signed)
°  Pt c/o medication issue:  1. Name of Medication: unknown  2. How are you currently taking this medication (dosage and times per day)? Unknown  3. Are you having a reaction (difficulty breathing--STAT)? no  4. What is your medication issue? Pt want rn to go over her medications she is unsure on which ones is making her weak

## 2017-11-25 ENCOUNTER — Encounter: Payer: Self-pay | Admitting: Family Medicine

## 2017-11-25 ENCOUNTER — Ambulatory Visit: Payer: Self-pay

## 2017-11-25 ENCOUNTER — Other Ambulatory Visit: Payer: Self-pay | Admitting: Family Medicine

## 2017-11-25 ENCOUNTER — Ambulatory Visit (INDEPENDENT_AMBULATORY_CARE_PROVIDER_SITE_OTHER): Payer: PPO | Admitting: Family Medicine

## 2017-11-25 VITALS — BP 112/68 | HR 81 | Temp 97.9°F | Ht 66.0 in | Wt 123.5 lb

## 2017-11-25 DIAGNOSIS — W57XXXA Bitten or stung by nonvenomous insect and other nonvenomous arthropods, initial encounter: Secondary | ICD-10-CM | POA: Diagnosis not present

## 2017-11-25 DIAGNOSIS — N183 Chronic kidney disease, stage 3 unspecified: Secondary | ICD-10-CM

## 2017-11-25 DIAGNOSIS — E559 Vitamin D deficiency, unspecified: Secondary | ICD-10-CM

## 2017-11-25 DIAGNOSIS — J449 Chronic obstructive pulmonary disease, unspecified: Secondary | ICD-10-CM

## 2017-11-25 DIAGNOSIS — S70361A Insect bite (nonvenomous), right thigh, initial encounter: Secondary | ICD-10-CM

## 2017-11-25 DIAGNOSIS — E785 Hyperlipidemia, unspecified: Secondary | ICD-10-CM

## 2017-11-25 DIAGNOSIS — R7303 Prediabetes: Secondary | ICD-10-CM

## 2017-11-25 DIAGNOSIS — I10 Essential (primary) hypertension: Secondary | ICD-10-CM

## 2017-11-25 NOTE — Telephone Encounter (Signed)
Pt has appt 11/25/17 at 10:15 with Dr Glori Bickers.

## 2017-11-25 NOTE — Telephone Encounter (Signed)
Pt. Reports had tick bite yesterday - on right groin area. States it is red and swollen. Appointment made for this morning by agent.  Reason for Disposition . [1] Red or very tender (to touch) area AND [2] started over 24 hours after the bite  Answer Assessment - Initial Assessment Questions 1. TYPE of TICK: "Is it a wood tick or a deer tick?" If unsure, ask: "What size was the tick?" "Did it look more like a watermelon seed or a poppy seed?"      Unsure 2. LOCATION: "Where is the tick bite located?"      Groin on right 3. ONSET: "How long do you think the tick was attached before you removed it?" (Hours or days)      Took it off yesterday 4. TETANUS: "When was the last tetanus booster?"      Unsure 5. PREGNANCY: "Is there any chance you are pregnant?" "When was your last menstrual period?"     No  Protocols used: TICK BITE-A-AH

## 2017-11-25 NOTE — Telephone Encounter (Signed)
I will see her then  

## 2017-11-25 NOTE — Patient Instructions (Addendum)
Keep tick bite site clean with soap and water   Over the counter cortisone cream is ok ok if itchy  Also cool compress helps itch   Watch for a bullseye appearance to the bite (circles) Watch for rash anywhere on body Watch for fever or generally feeling bad   Your lungs sound clear- but if cough worsens or you develop worse shortness of breath or other symptoms let us and Dr Lamonte Sakai know  Continue current medicines and oxygen

## 2017-11-25 NOTE — Assessment & Plan Note (Signed)
Per pt -just a little more than baseline (cough)  Denies sob  Reassuring exam and pulse ox  Will notify us and Dr Lamonte Sakai if symptoms continue or worsen

## 2017-11-25 NOTE — Progress Notes (Signed)
Subjective:    Patient ID: [name], female    DOB: 23-Oct-1940, 77 y.o.   MRN: [id number]  HPI  Here for tick bite and a cough (known hx of copd)   77 yo pt of Dr Darnell Level  Brought tick Wednesday in R inner leg/thigh  Medium size tick with white spot on her back  Thinks it bit her Tuesday based on what she was doing   Not engorged /but had blood on it  Niece got it off for her  A little red  A bit itchy  No bullseye app   No fever or rash  Feeling ok   Has a cough - thinks from copd  Feels like her normal copd  Phlegm- a little white phlegm  Saw Dr Lamonte Sakai on Tuesday  Does not feel sick  No nasal symptoms or throat pain (besides allergies) No more sob than usual   On 02 - is supposed to start using all the time  Pulse ox is 97% today  Patient Active Problem List   Diagnosis Date Noted  . Tick bite of right thigh 11/25/2017  . Chronic respiratory failure with hypoxia ([place]) 11/23/2017  . PVC's (premature ventricular contractions) 08/31/2017  . Skin rash 08/02/2017  . History of ST elevation myocardial infarction (STEMI) 07/06/2017  . Dyspnea 05/03/2017  . Acute bronchitis 12/08/2016  . DNR (do not resuscitate) 12/07/2016  . Allergic rhinitis 07/31/2016  . Productive cough 06/29/2016  . Symptomatic bradycardia 04/01/2016  . Malnutrition of moderate degree 03/20/2016  . COPD (chronic obstructive pulmonary disease) ([place]) 03/19/2016  . Lumbar pain with radiation down left leg 03/02/2016  . Underweight 10/07/2015  . Vertigo 04/22/2015  . Skin nodule 04/22/2015  . Health maintenance examination 04/08/2015  . Advanced care planning/counseling discussion 04/08/2015  . Vitamin D insufficiency 02/02/2015  . CKD (chronic kidney disease) stage 3, GFR 30-59 ml/min (HCC)   . History of CVA (cerebrovascular accident) without residual deficits   . Prediabetes   . Ischemic cardiomyopathy 02/27/2014  . Osteoporosis 09/24/2013  . Pulmonary nodules 10/26/2012  . History of TIA  (transient ischemic attack) 07/08/2012  . Cancer of lower lobe of right lung ([place]) 04/21/2012  . Chronic combined systolic and diastolic heart failure ([place]) 06/17/2009  . HLD (hyperlipidemia) 04/24/2009  . CAD (coronary artery disease) 04/24/2009  . GERD 04/24/2009  . Macular degeneration (senile) of retina 04/23/2009  . Essential hypertension 04/23/2009  . Osteoarthritis 04/23/2009   Past Medical History:  Diagnosis Date  . Arthritis   . CAD (coronary artery disease) 03/2009   s/p MI  . Chronic combined systolic and diastolic heart failure ([place]) 06/17/2009   Qualifier: Diagnosis of  By: Burt Knack, MD, [place]burn Pert   . CKD (chronic kidney disease) stage 3, GFR 30-59 ml/min (HCC)   . Emphysema/COPD ([place])   . Ex-smoker quit 2008  . GERD (gastroesophageal reflux disease)   . HCAP (healthcare-associated pneumonia) 03/19/2016  . Helicobacter pylori gastritis 10/2007   treated  . History of CVA (cerebrovascular accident) without residual deficits 01/2012, 06/2012   R hemorrhagic MCA 01/2012 with remote lacunar infarct L putamen and IC, rpt 06/2012 acute multifocal R MCA infarct with remote hemorrhagic strokes affecting L basal ganglia and periventricular white matter, full recovery  . HLD (hyperlipidemia)   . HTN (hypertension)   . Ischemic cardiomyopathy 02/27/2014  . Lung cancer ([place]) dx'd 07/2006   Lung CA, s/p resection, followed by Dr Earlie Server  . Macular degeneration   . Myocardial infarction (  [place]) 03/2009   Acute myocardial infarction 2010 - treated with BMS of LCx. LVEF 50% with subsequent CHF  . NSTEMI (non-ST elevated myocardial infarction) ([place])   . Osteoporosis 09/2013   T -3.6 forearm 09/2013, T -4.5 forearm 04/2015  . Stroke [facility])    Past Surgical History:  Procedure Laterality Date  . BREAST BIOPSY Left   . CARDIAC CATHETERIZATION N/A 02/24/2016   Procedure: Left Heart Cath and Coronary Angiography;  Surgeon: Belva Crome, MD;  Location: [place] [facility];  Service:  Cardiovascular;  Laterality: N/A;  . CARDIAC CATHETERIZATION N/A 07/09/2016   Procedure: Left Heart Cath and Coronary Angiography;  Surgeon: Peter M Martinique, MD;  Location: [place] [facility];  Service: Cardiovascular;  Laterality: N/A;  . CARDIAC CATHETERIZATION N/A 07/09/2016   Procedure: Coronary Stent Intervention;  Surgeon: Peter M Martinique, MD;  Location: [place] [facility];  Service: Cardiovascular;  Laterality: N/A;  . CATARACT EXTRACTION Bilateral   . CORONARY STENT INTERVENTION N/A 07/06/2017   Procedure: CORONARY STENT INTERVENTION;  Surgeon: Lorretta Harp, MD;  Location: [place] [facility];  Service: Cardiovascular;  Laterality: N/A;  . CORONARY STENT PLACEMENT  06/2016   DES to mid LAD  . CORONARY/GRAFT ACUTE MI REVASCULARIZATION N/A 07/06/2017   Procedure: Coronary/Graft Acute MI Revascularization;  Surgeon: Lorretta Harp, MD;  Location: [place] [facility];  Service: Cardiovascular;  Laterality: N/A;  . EYE SURGERY    . LEFT HEART CATH AND CORONARY ANGIOGRAPHY N/A 07/06/2017   Procedure: LEFT HEART CATH AND CORONARY ANGIOGRAPHY;  Surgeon: Lorretta Harp, MD;  Location: [facility];  Service: Cardiovascular;  Laterality: N/A;  . LUNG REMOVAL, PARTIAL Right 2008  . PARTIAL HYSTERECTOMY  1986   irregular periods, ovaries remain   Social History   Tobacco Use  . Smoking status: Former Smoker    Types: Cigarettes    Last attempt to quit: 07/27/2002    Years since quitting: 15.3  . Smokeless tobacco: Never Used  Substance Use Topics  . Alcohol use: No    Alcohol/week: 0.0 oz  . Drug use: No   Family History  Problem Relation Age of Onset  . CAD Mother 38       MI  . Stroke Mother   . Hypertension Mother   . Heart attack Mother   . CAD Father 53  . Stroke Father   . Breast cancer Sister   . Diabetes Sister    Allergies  Allergen Reactions  . Prednisone     Shaking   . Actonel [Risedronate Sodium] Other (See Comments)    Headache  . Amlodipine  Other (See Comments)    Pedal edema  . Brilinta [Ticagrelor] Other (See Comments)    Worsening dyspnea, malaise  . Codeine Rash  . Fosamax [Alendronate Sodium] Other (See Comments)    Unable to tolerate  . Lisinopril Cough  . Pravastatin Other (See Comments)    Constipation.   Current Outpatient Medications on File Prior to Visit  Medication Sig Dispense Refill  . albuterol (PROAIR HFA) 108 (90 Base) MCG/ACT inhaler Inhale 2 puffs into the lungs every 4 (four) hours as needed for wheezing or shortness of breath. 1 Inhaler 5  . albuterol (PROVENTIL) (2.5 MG/3ML) 0.083% nebulizer solution Take 3 mLs (2.5 mg total) by nebulization every 4 (four) hours as needed for wheezing or shortness of breath. 150 mL 5  . aspirin EC 81 MG tablet Take 1 tablet (81 mg total) by mouth  daily.    . calcium carbonate (OS-CAL - DOSED IN MG OF ELEMENTAL CALCIUM) 1250 (500 Ca) MG tablet Take 1 tablet by mouth 2 (two) times daily with a meal.    . clopidogrel (PLAVIX) 75 MG tablet Take 1 tablet (75 mg total) by mouth daily. 30 tablet 11  . denosumab (PROLIA) 60 MG/ML SOLN injection Inject 60 mg into the skin every 6 (six) months. Administer in upper arm, thigh, or abdomen 60 mL 1  . fluticasone (FLONASE) 50 MCG/ACT nasal spray INSTILL TWO SPRAYS INTO BOTH NOSTRILS DAILY. 16 g 5  . furosemide (LASIX) 20 MG tablet Take 1 tablet (20 mg total) by mouth daily. Take an additional 1/2 tablet daily for swelling. 40 tablet 11  . ipratropium-albuterol (DUONEB) 0.5-2.5 (3) MG/3ML SOLN INHALE 1 VIAL VIA NEBULIZER FOUR TIMES ADAY AND EVERY 4 HOURS AS NEEDED AS DIRECTED 360 mL 5  . isosorbide mononitrate (IMDUR) 30 MG 24 hr tablet TAKE 1 TABLET BY MOUTH DAILY 30 tablet 10  . loratadine (CLARITIN) 10 MG tablet Take 10 mg by mouth daily as needed for allergies or rhinitis.     . LOTEMAX 0.5 % ophthalmic suspension Place 1 drop into both eyes daily.     . nitroGLYCERIN (NITROSTAT) 0.4 MG SL tablet Place 1 tablet (0.4 mg total) under  the tongue every 5 (five) minutes as needed for chest pain. 25 tablet 3  . pantoprazole (PROTONIX) 40 MG tablet TAKE 1 TABLET BY MOUTH ONCE DAILY 30 tablet 11  . prednisoLONE acetate (PRED FORTE) 1 % ophthalmic suspension 1 drop daily. Both eyes    . simvastatin (ZOCOR) 40 MG tablet TAKE 1 TABLET BY MOUTH AT BEDTIME 30 tablet 11  . Spacer/Aero-Holding Chambers (AEROCHAMBER MV) inhaler Use as instructed 1 each 0  . spironolactone (ALDACTONE) 25 MG tablet Take 1 tablet (25 mg total) by mouth daily. 45 tablet 3   No current facility-administered medications on file prior to visit.      Review of Systems  Constitutional: Negative for activity change, appetite change, fatigue, fever and unexpected weight change.  HENT: Negative for congestion, ear pain, rhinorrhea, sinus pressure and sore throat.   Eyes: Negative for pain, redness and visual disturbance.  Respiratory: Negative for cough, shortness of breath and wheezing.   Cardiovascular: Negative for chest pain and palpitations.  Gastrointestinal: Negative for abdominal pain, blood in stool, constipation and diarrhea.  Endocrine: Negative for polydipsia and polyuria.  Genitourinary: Negative for dysuria, frequency and urgency.  Musculoskeletal: Negative for arthralgias, back pain and myalgias.  Skin: Negative for pallor and rash.       Insect bite R inner thigh  Allergic/Immunologic: Negative for environmental allergies.  Neurological: Negative for dizziness, syncope and headaches.  Hematological: Negative for adenopathy. Does not bruise/bleed easily.  Psychiatric/Behavioral: Negative for decreased concentration and dysphoric mood. The patient is not nervous/anxious.        Objective:   Physical Exam  Constitutional: She appears well-developed and well-nourished. No distress.  HENT:  Head: Normocephalic and atraumatic.  Mouth/Throat: Oropharynx is clear and moist.  Eyes: Pupils are equal, round, and reactive to light. Conjunctivae and  EOM are normal.  Neck: Normal range of motion. Neck supple. No JVD present. Carotid bruit is not present. No thyromegaly present.  Cardiovascular: Normal rate, regular rhythm, normal heart sounds and intact distal pulses. Exam reveals no gallop.  Pulmonary/Chest: Effort normal and breath sounds normal. No respiratory distress. She has no wheezes. She has no rales.  No crackles  Diffusely distant bs  No rales/rhohchi or wheeze today  Fair air exch   Abdominal: Soft. Bowel sounds are normal. She exhibits no distension, no abdominal bruit and no mass. There is no tenderness.  Musculoskeletal: She exhibits no edema.  Lymphadenopathy:    She has no cervical adenopathy.  Neurological: She is alert. She has normal reflexes.  Skin: Skin is warm and dry. No rash noted.  Small scab from tick bite on R inner thigh with 1-2 cm of erythema and induration around it  No bullseye pattern No drainage   No rash   Psychiatric: Her mood appears anxious.  Notably anxious           Assessment & Plan:   Problem List Items Addressed This Visit      Respiratory   COPD (chronic obstructive pulmonary disease) ([place])    Per pt -just a little more than baseline (cough)  Denies sob  Reassuring exam and pulse ox  Will notify us and Dr Lamonte Sakai if symptoms continue or worsen         Musculoskeletal and Integument   Tick bite of right thigh - Primary    Small local rxn at bite site No rash/fever or other symptoms  Disc in detail what to watch for  Tick may have been lone star based on appearance Disc avoidance of insect bites when able

## 2017-11-25 NOTE — Assessment & Plan Note (Signed)
Small local rxn at bite site No rash/fever or other symptoms  Disc in detail what to watch for  Tick may have been lone star based on appearance Disc avoidance of insect bites when able

## 2017-11-26 ENCOUNTER — Ambulatory Visit (INDEPENDENT_AMBULATORY_CARE_PROVIDER_SITE_OTHER): Payer: PPO

## 2017-11-26 ENCOUNTER — Ambulatory Visit: Payer: PPO

## 2017-11-26 VITALS — BP 90/70 | HR 50 | Temp 97.7°F | Ht 66.0 in | Wt 124.5 lb

## 2017-11-26 DIAGNOSIS — Z0000 Encounter for general adult medical examination without abnormal findings: Secondary | ICD-10-CM

## 2017-11-26 DIAGNOSIS — E785 Hyperlipidemia, unspecified: Secondary | ICD-10-CM | POA: Diagnosis not present

## 2017-11-26 DIAGNOSIS — R7303 Prediabetes: Secondary | ICD-10-CM | POA: Diagnosis not present

## 2017-11-26 DIAGNOSIS — E559 Vitamin D deficiency, unspecified: Secondary | ICD-10-CM

## 2017-11-26 DIAGNOSIS — I10 Essential (primary) hypertension: Secondary | ICD-10-CM

## 2017-11-26 DIAGNOSIS — N183 Chronic kidney disease, stage 3 unspecified: Secondary | ICD-10-CM

## 2017-11-26 LAB — CBC WITH DIFFERENTIAL/PLATELET
BASOS PCT: 1 % (ref 0.0–3.0)
Basophils Absolute: 0.1 10*3/uL (ref 0.0–0.1)
Eosinophils Absolute: 1.1 10*3/uL — ABNORMAL HIGH (ref 0.0–0.7)
HEMATOCRIT: 44.8 % (ref 36.0–46.0)
HEMOGLOBIN: 15 g/dL (ref 12.0–15.0)
LYMPHS PCT: 26.8 % (ref 12.0–46.0)
Lymphs Abs: 1.9 10*3/uL (ref 0.7–4.0)
MCHC: 33.5 g/dL (ref 30.0–36.0)
MCV: 95.4 fl (ref 78.0–100.0)
MONO ABS: 0.6 10*3/uL (ref 0.1–1.0)
Monocytes Relative: 8.7 % (ref 3.0–12.0)
Neutro Abs: 3.5 10*3/uL (ref 1.4–7.7)
Neutrophils Relative %: 48.7 % (ref 43.0–77.0)
Platelets: 283 10*3/uL (ref 150.0–400.0)
RBC: 4.69 Mil/uL (ref 3.87–5.11)
RDW: 14.7 % (ref 11.5–15.5)
WBC: 7.3 10*3/uL (ref 4.0–10.5)

## 2017-11-26 LAB — COMPREHENSIVE METABOLIC PANEL
ALBUMIN: 4.3 g/dL (ref 3.5–5.2)
ALK PHOS: 73 U/L (ref 39–117)
ALT: 13 U/L (ref 0–35)
AST: 23 U/L (ref 0–37)
BUN: 19 mg/dL (ref 6–23)
CO2: 29 mEq/L (ref 19–32)
Calcium: 10.3 mg/dL (ref 8.4–10.5)
Chloride: 101 mEq/L (ref 96–112)
Creatinine, Ser: 1.03 mg/dL (ref 0.40–1.20)
GFR: 55.23 mL/min — AB (ref >60.00)
Glucose, Bld: 89 mg/dL (ref 70–99)
POTASSIUM: 4.5 meq/L (ref 3.5–5.1)
Sodium: 139 mEq/L (ref 135–145)
TOTAL PROTEIN: 7.4 g/dL (ref 6.0–8.3)
Total Bilirubin: 0.6 mg/dL (ref 0.2–1.2)

## 2017-11-26 LAB — MICROALBUMIN / CREATININE URINE RATIO
Creatinine,U: 56.3 mg/dL
MICROALB/CREAT RATIO: 1.2 mg/g (ref 0.0–30.0)
Microalb, Ur: <0.7 mg/dL (ref 0.0–1.9)

## 2017-11-26 LAB — LIPID PANEL
Cholesterol: 155 mg/dL (ref 0–200)
HDL: 85.3 mg/dL (ref >39.00)
LDL CALC: 57 mg/dL (ref 0–99)
NonHDL: 69.55
Total CHOL/HDL Ratio: 2
Triglycerides: 63 mg/dL (ref 0.0–149.0)
VLDL: 12.6 mg/dL (ref 0.0–40.0)

## 2017-11-26 LAB — HEMOGLOBIN A1C: HEMOGLOBIN A1C: 6.1 % (ref 4.6–6.5)

## 2017-11-26 LAB — VITAMIN D 25 HYDROXY (VIT D DEFICIENCY, FRACTURES): VITD: 85.18 ng/mL (ref 30.00–100.00)

## 2017-11-26 NOTE — Patient Instructions (Signed)
[name] , Thank you for taking time to come for your Medicare Wellness Visit. I appreciate your ongoing commitment to your health goals. Please review the following plan we discussed and let me know if I can assist you in the future.   These are the goals we discussed: Goals    . Patient Stated     Starting 11/26/2017, I will continue to take medications as prescribed.        This is a list of the screening recommended for you and due dates:  Health Maintenance  Topic Date Due  . DTaP/Tdap/Td vaccine (1 - Tdap) 07/26/2022*  . Flu Shot  02/24/2018  . Mammogram  08/02/2018  . Tetanus Vaccine  07/08/2022  . DEXA scan (bone density measurement)  Completed  . Pneumonia vaccines  Completed  *Topic was postponed. The date shown is not the original due date.   Preventive Care for Adults  A healthy lifestyle and preventive care can promote health and wellness. Preventive health guidelines for adults include the following key practices.  . A routine yearly physical is a good way to check with your health care provider about your health and preventive screening. It is a chance to share any concerns and updates on your health and to receive a thorough exam.  . Visit your dentist for a routine exam and preventive care every 6 months. Brush your teeth twice a day and floss once a day. Good oral hygiene prevents tooth decay and gum disease.  . The frequency of eye exams is based on your age, health, family medical history, use  of contact lenses, and other factors. Follow your health care provider's recommendations for frequency of eye exams.  . Eat a healthy diet. Foods like vegetables, fruits, whole grains, low-fat dairy products, and lean protein foods contain the nutrients you need without too many calories. Decrease your intake of foods high in solid fats, added sugars, and salt. Eat the right amount of calories for you. Get information about a proper diet from your health care provider, if  necessary.  . Regular physical exercise is one of the most important things you can do for your health. Most adults should get at least 150 minutes of moderate-intensity exercise (any activity that increases your heart rate and causes you to sweat) each week. In addition, most adults need muscle-strengthening exercises on 2 or more days a week.  Silver Sneakers may be a benefit available to you. To determine eligibility, you may visit the website: www.silversneakers.com or contact program at [phone] Mon-Fri between 8AM-8PM.   . Maintain a healthy weight. The body mass index (BMI) is a screening tool to identify possible weight problems. It provides an estimate of body fat based on height and weight. Your health care provider can find your BMI and can help you achieve or maintain a healthy weight.   For adults 20 years and older: ? A BMI below 18.5 is considered underweight. ? A BMI of 18.5 to 24.9 is normal. ? A BMI of 25 to 29.9 is considered overweight. ? A BMI of 30 and above is considered obese.   . Maintain normal blood lipids and cholesterol levels by exercising and minimizing your intake of saturated fat. Eat a balanced diet with plenty of fruit and vegetables. Blood tests for lipids and cholesterol should begin at age 100 and be repeated every 5 years. If your lipid or cholesterol levels are high, you are over 50, or you are at high risk for  heart disease, you may need your cholesterol levels checked more frequently. Ongoing high lipid and cholesterol levels should be treated with medicines if diet and exercise are not working.  . If you smoke, find out from your health care provider how to quit. If you do not use tobacco, please do not start.  . If you choose to drink alcohol, please do not consume more than 2 drinks per day. One drink is considered to be 12 ounces (355 mL) of beer, 5 ounces (148 mL) of wine, or 1.5 ounces (44 mL) of liquor.  . If you are 47-12 years old, ask your  health care provider if you should take aspirin to prevent strokes.  . Use sunscreen. Apply sunscreen liberally and repeatedly throughout the day. You should seek shade when your shadow is shorter than you. Protect yourself by wearing long sleeves, pants, a wide-brimmed hat, and sunglasses year round, whenever you are outdoors.  . Once a month, do a whole body skin exam, using a mirror to look at the skin on your back. Tell your health care provider of new moles, moles that have irregular borders, moles that are larger than a pencil eraser, or moles that have changed in shape or color.

## 2017-11-26 NOTE — Progress Notes (Signed)
PCP notes:   Health maintenance:  No gaps identified.   Abnormal screenings:   None  Patient concerns:   None  Nurse concerns:  None  Next PCP appt:   12/09/17 @ 1030  I reviewed health advisor's note, was available for consultation, and agree with documentation and plan. Loura Pardon MD

## 2017-11-26 NOTE — Progress Notes (Signed)
Subjective:   Monetta Lick Thal is a 77 y.o. female who presents for Medicare Annual (Subsequent) preventive examination.  Review of Systems:  N/A Cardiac Risk Factors include: advanced age (>28men, >12 women);hypertension;dyslipidemia     Objective:     Vitals: BP 90/70 (BP Location: Right Arm, Patient Position: Sitting, Cuff Size: Normal)   Pulse (!) 50   Temp 97.7 F (36.5 C) (Oral)   Ht 5\' 6"  (1.676 m) Comment: no shoes  Wt 124 lb 8 oz (56.5 kg)   SpO2 96%   BMI 20.09 kg/m   Body mass index is 20.09 kg/m.  Advanced Directives 11/26/2017 09/10/2017 07/07/2017 07/06/2017 11/25/2016 07/08/2016 07/08/2016  Does Patient Have a Medical Advance Directive? Yes Yes Yes No Yes Yes No  Type of Paramedic of [place];Living will [place];Living will;Out of facility DNR (pink MOST or yellow form) Living will - [place];Living will [place];Living will -  Does patient want to make changes to medical advance directive? - No - Patient declined No - Patient declined - - No - Patient declined -  Copy of [place] in Chart? No - copy requested Yes - - No - copy requested No - copy requested -  Would patient like information on creating a medical advance directive? - - - - - - -  Pre-existing out of facility DNR order (yellow form or pink MOST form) - - - - - - -    Tobacco Social History   Tobacco Use  Smoking Status Former Smoker  . Types: Cigarettes  . Last attempt to quit: 07/27/2002  . Years since quitting: 15.3  Smokeless Tobacco Never Used     Counseling given: No   Clinical Intake:  Pre-visit preparation completed: Yes  Pain : No/denies pain Pain Score: 0-No pain     Nutritional Status: BMI of 19-24  Normal Nutritional Risks: None Diabetes: No  How often do you need to have someone help you when you read instructions, pamphlets, or other written materials from your doctor  or pharmacy?: 1 - Never What is the last grade level you completed in school?: 12th grade  Interpreter Needed?: No  Comments: pt lives with spouse Information entered by :: LPinson, LPN  Past Medical History:  Diagnosis Date  . Arthritis   . CAD (coronary artery disease) 03/2009   s/p MI  . Chronic combined systolic and diastolic heart failure ([place]) 06/17/2009   Qualifier: Diagnosis of  By: Burt Knack, MD, [place] Pert   . CKD (chronic kidney disease) stage 3, GFR 30-59 ml/min (HCC)   . Emphysema/COPD ([place])   . Ex-smoker quit 2008  . GERD (gastroesophageal reflux disease)   . HCAP (healthcare-associated pneumonia) 03/19/2016  . Helicobacter pylori gastritis 10/2007   treated  . History of CVA (cerebrovascular accident) without residual deficits 01/2012, 06/2012   R hemorrhagic MCA 01/2012 with remote lacunar infarct L putamen and IC, rpt 06/2012 acute multifocal R MCA infarct with remote hemorrhagic strokes affecting L basal ganglia and periventricular white matter, full recovery  . HLD (hyperlipidemia)   . HTN (hypertension)   . Ischemic cardiomyopathy 02/27/2014  . Lung cancer ([place]) dx'd 07/2006   Lung CA, s/p resection, followed by Dr Earlie Server  . Macular degeneration   . Myocardial infarction [facility]) 03/2009   Acute myocardial infarction 2010 - treated with BMS of LCx. LVEF 50% with subsequent CHF  . NSTEMI (non-ST elevated myocardial infarction) ([place])   .  Osteoporosis 09/2013   T -3.6 forearm 09/2013, T -4.5 forearm 04/2015  . Stroke [facility])    Past Surgical History:  Procedure Laterality Date  . BREAST BIOPSY Left   . CARDIAC CATHETERIZATION N/A 02/24/2016   Procedure: Left Heart Cath and Coronary Angiography;  Surgeon: Belva Crome, MD;  Location: [place] CV LAB;  Service: Cardiovascular;  Laterality: N/A;  . CARDIAC CATHETERIZATION N/A 07/09/2016   Procedure: Left Heart Cath and Coronary Angiography;  Surgeon: Peter M Martinique, MD;  Location: [place] CV LAB;  Service:  Cardiovascular;  Laterality: N/A;  . CARDIAC CATHETERIZATION N/A 07/09/2016   Procedure: Coronary Stent Intervention;  Surgeon: Peter M Martinique, MD;  Location: [place] CV LAB;  Service: Cardiovascular;  Laterality: N/A;  . CATARACT EXTRACTION Bilateral   . CORONARY STENT INTERVENTION N/A 07/06/2017   Procedure: CORONARY STENT INTERVENTION;  Surgeon: Lorretta Harp, MD;  Location: [place] CV LAB;  Service: Cardiovascular;  Laterality: N/A;  . CORONARY STENT PLACEMENT  06/2016   DES to mid LAD  . CORONARY/GRAFT ACUTE MI REVASCULARIZATION N/A 07/06/2017   Procedure: Coronary/Graft Acute MI Revascularization;  Surgeon: Lorretta Harp, MD;  Location: [place] CV LAB;  Service: Cardiovascular;  Laterality: N/A;  . EYE SURGERY    . LEFT HEART CATH AND CORONARY ANGIOGRAPHY N/A 07/06/2017   Procedure: LEFT HEART CATH AND CORONARY ANGIOGRAPHY;  Surgeon: Lorretta Harp, MD;  Location: [place] CV LAB;  Service: Cardiovascular;  Laterality: N/A;  . LUNG REMOVAL, PARTIAL Right 2008  . PARTIAL HYSTERECTOMY  1986   irregular periods, ovaries remain   Family History  Problem Relation Age of Onset  . CAD Mother 75       MI  . Stroke Mother   . Hypertension Mother   . Heart attack Mother   . CAD Father 59  . Stroke Father   . Breast cancer Sister   . Diabetes Sister    Social History   Socioeconomic History  . Marital status: Widowed    Spouse name: Not on file  . Number of children: 1  . Years of education: Not on file  . Highest education level: Not on file  Occupational History  . Occupation: retired    Comment: insurance  Social Needs  . Financial resource strain: Not on file  . Food insecurity:    Worry: Not on file    Inability: Not on file  . Transportation needs:    Medical: Not on file    Non-medical: Not on file  Tobacco Use  . Smoking status: Former Smoker    Types: Cigarettes    Last attempt to quit: 07/27/2002    Years since quitting: 15.3  .  Smokeless tobacco: Never Used  Substance and Sexual Activity  . Alcohol use: No    Alcohol/week: 0.0 oz  . Drug use: No  . Sexual activity: Never  Lifestyle  . Physical activity:    Days per week: Not on file    Minutes per session: Not on file  . Stress: Not on file  Relationships  . Social connections:    Talks on phone: Not on file    Gets together: Not on file    Attends religious service: Not on file    Active member of club or organization: Not on file    Attends meetings of clubs or organizations: Not on file    Relationship status: Not on file  Other Topics Concern  . Not on file  Social History Narrative   The patient is a widow and has one son and one dog.     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  She does not drink alcohol. 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   Ed: HS   Activity: no regular exercise   Diet: some water, some fruits/vegetables    Lives in a one story home.     Outpatient Encounter Medications as of 11/26/2017  Medication Sig  . albuterol (PROAIR HFA) 108 (90 Base) MCG/ACT inhaler Inhale 2 puffs into the lungs every 4 (four) hours as needed for wheezing or shortness of breath.  Marland Kitchen albuterol (PROVENTIL) (2.5 MG/3ML) 0.083% nebulizer solution Take 3 mLs (2.5 mg total) by nebulization every 4 (four) hours as needed for wheezing or shortness of breath.  Marland Kitchen aspirin EC 81 MG tablet Take 1 tablet (81 mg total) by mouth daily.  . calcium carbonate (OS-CAL - DOSED IN MG OF ELEMENTAL CALCIUM) 1250 (500 Ca) MG tablet Take 1 tablet by mouth 2 (two) times daily with a meal.  . clopidogrel (PLAVIX) 75 MG tablet Take 1 tablet (75 mg total) by mouth daily.  Marland Kitchen denosumab (PROLIA) 60 MG/ML SOLN injection Inject 60 mg into the skin every 6 (six) months. Administer in upper arm, thigh, or abdomen  . fluticasone (FLONASE) 50 MCG/ACT nasal spray INSTILL TWO SPRAYS INTO BOTH NOSTRILS DAILY.  . furosemide (LASIX)  20 MG tablet Take 1 tablet (20 mg total) by mouth daily. Take an additional 1/2 tablet daily for swelling.  Marland Kitchen ipratropium-albuterol (DUONEB) 0.5-2.5 (3) MG/3ML SOLN INHALE 1 VIAL VIA NEBULIZER FOUR TIMES ADAY AND EVERY 4 HOURS AS NEEDED AS DIRECTED  . isosorbide mononitrate (IMDUR) 30 MG 24 hr tablet TAKE 1 TABLET BY MOUTH DAILY  . loratadine (CLARITIN) 10 MG tablet Take 10 mg by mouth daily as needed for allergies or rhinitis.   . LOTEMAX 0.5 % ophthalmic suspension Place 1 drop into both eyes daily.   . nitroGLYCERIN (NITROSTAT) 0.4 MG SL tablet Place 1 tablet (0.4 mg total) under the tongue every 5 (five) minutes as needed for chest pain.  . pantoprazole (PROTONIX) 40 MG tablet TAKE 1 TABLET BY MOUTH ONCE DAILY  . prednisoLONE acetate (PRED FORTE) 1 % ophthalmic suspension 1 drop daily. Both eyes  . simvastatin (ZOCOR) 40 MG tablet TAKE 1 TABLET BY MOUTH AT BEDTIME  . Spacer/Aero-Holding Chambers (AEROCHAMBER MV) inhaler Use as instructed  . spironolactone (ALDACTONE) 25 MG tablet Take 1 tablet (25 mg total) by mouth daily.   No facility-administered encounter medications on file as of 11/26/2017.     Activities of Daily Living In your present state of health, do you have any difficulty performing the following activities: 11/26/2017 09/10/2017  Hearing? N N  Vision? N N  Difficulty concentrating or making decisions? N N  Walking or climbing stairs? Y Y  Dressing or bathing? N N  Doing errands, shopping? N N  Preparing Food and eating ? N N  Using the Toilet? N N  In the past six months, have you accidently leaked urine? N N  Do you have problems with loss of bowel control? N N  Managing your Medications? N N  Managing your Finances? N N  Housekeeping or managing your Housekeeping? N N  Some recent data might be hidden    Patient Care Team: Ria Bush, MD as PCP - General (Family Medicine)  Sherren Mocha, MD as PCP - Cardiology (Cardiology) Pierzchala, [name], RN as [facility]    Assessment:   This is a routine wellness examination for Emma.   Hearing Screening   125Hz  250Hz  500Hz  1000Hz  2000Hz  3000Hz  4000Hz  6000Hz  8000Hz   Right ear:   40 40 40  40    Left ear:   40 40 40  40    Vision Screening Comments: Feb 2019 with Dr. Nichola Sizer    Exercise Activities and Dietary recommendations Current Exercise Habits: The patient does not participate in regular exercise at present, Exercise limited by: None identified  Goals    . Patient Stated     Starting 11/26/2017, I will continue to take medications as prescribed.        Fall Risk Fall Risk  11/26/2017 09/10/2017 11/25/2016 03/31/2016 03/09/2016  Falls in the past year? No No No No No  Risk for fall due to : - - - - Medication side effect   Depression Screen PHQ 2/9 Scores 11/26/2017 09/10/2017 11/25/2016 03/31/2016  PHQ - 2 Score 0 0 0 0  PHQ- 9 Score 0 - - -     Cognitive Function MMSE - Mini Mental State Exam 11/26/2017 11/25/2016 01/08/2016  Orientation to time 5 5 5   Orientation to Place 5 5 5   Registration 3 3 3   Attention/ Calculation 0 0 0  Recall 3 3 3   Language- name 2 objects 0 0 0  Language- repeat 1 1 1   Language- follow 3 step command 3 3 3   Language- read & follow direction 0 0 0  Write a sentence 0 0 0  Copy design 0 0 0  Total score 20 20 20        PLEASE NOTE: A Mini-Cog screen was completed. Maximum score is 20. A value of 0 denotes this part of [place] MMSE was not completed or the patient failed this part of the Mini-Cog screening.   Mini-Cog Screening Orientation to Time - Max 5 pts Orientation to Place - Max 5 pts Registration - Max 3 pts Recall - Max 3 pts Language Repeat - Max 1 pts Language Follow 3 Step Command - Max 3 pts   Immunization History  Administered Date(s) Administered  . Influenza Split 03/29/2012, 05/08/2013  . Influenza, High Dose Seasonal PF 04/06/2017  . Influenza,inj,Quad PF,6+ Mos 02/24/2014, 04/08/2015, 04/01/2016  .  Pneumococcal Conjugate-13 05/08/2015  . Pneumococcal Polysaccharide-23 07/27/2009  . Zoster 07/28/2011   Screening Tests Health Maintenance  Topic Date Due  . DTaP/Tdap/Td (1 - Tdap) 07/26/2022 (Originally 12/10/1959)  . INFLUENZA VACCINE  02/24/2018  . MAMMOGRAM  08/02/2018  . TETANUS/TDAP  07/08/2022  . DEXA SCAN  Completed  . PNA vac Low Risk Adult  Completed      Plan:     I have personally reviewed, addressed, and noted the following in the patient's chart:  A. Medical and social history B. Use of alcohol, tobacco or illicit drugs  C. Current medications and supplements D. Functional ability and status E.  Nutritional status F.  Physical activity G. Advance directives H. List of other physicians I.  Hospitalizations, surgeries, and ER visits in previous 12 months J.  [place] to include hearing, vision, cognitive, depression L. Referrals and appointments - none  In addition, I have reviewed and discussed with patient certain preventive protocols, quality metrics, and best practice recommendations. A written personalized care plan for preventive services as well as general preventive health recommendations  were provided to patient.  See attached scanned questionnaire for additional information.   Signed,   Lindell Noe, MHA, BS, LPN Health Coach

## 2017-11-28 IMAGING — CR DG CHEST 1V PORT
1 series · 1 of 1 positions shown · non-contrast
Comparison: June 29, 2016 chest radiograph and chest CT June 22, 2016

CLINICAL DATA: Chest pain

EXAM:
PORTABLE CHEST 1 VIEW

[portable]
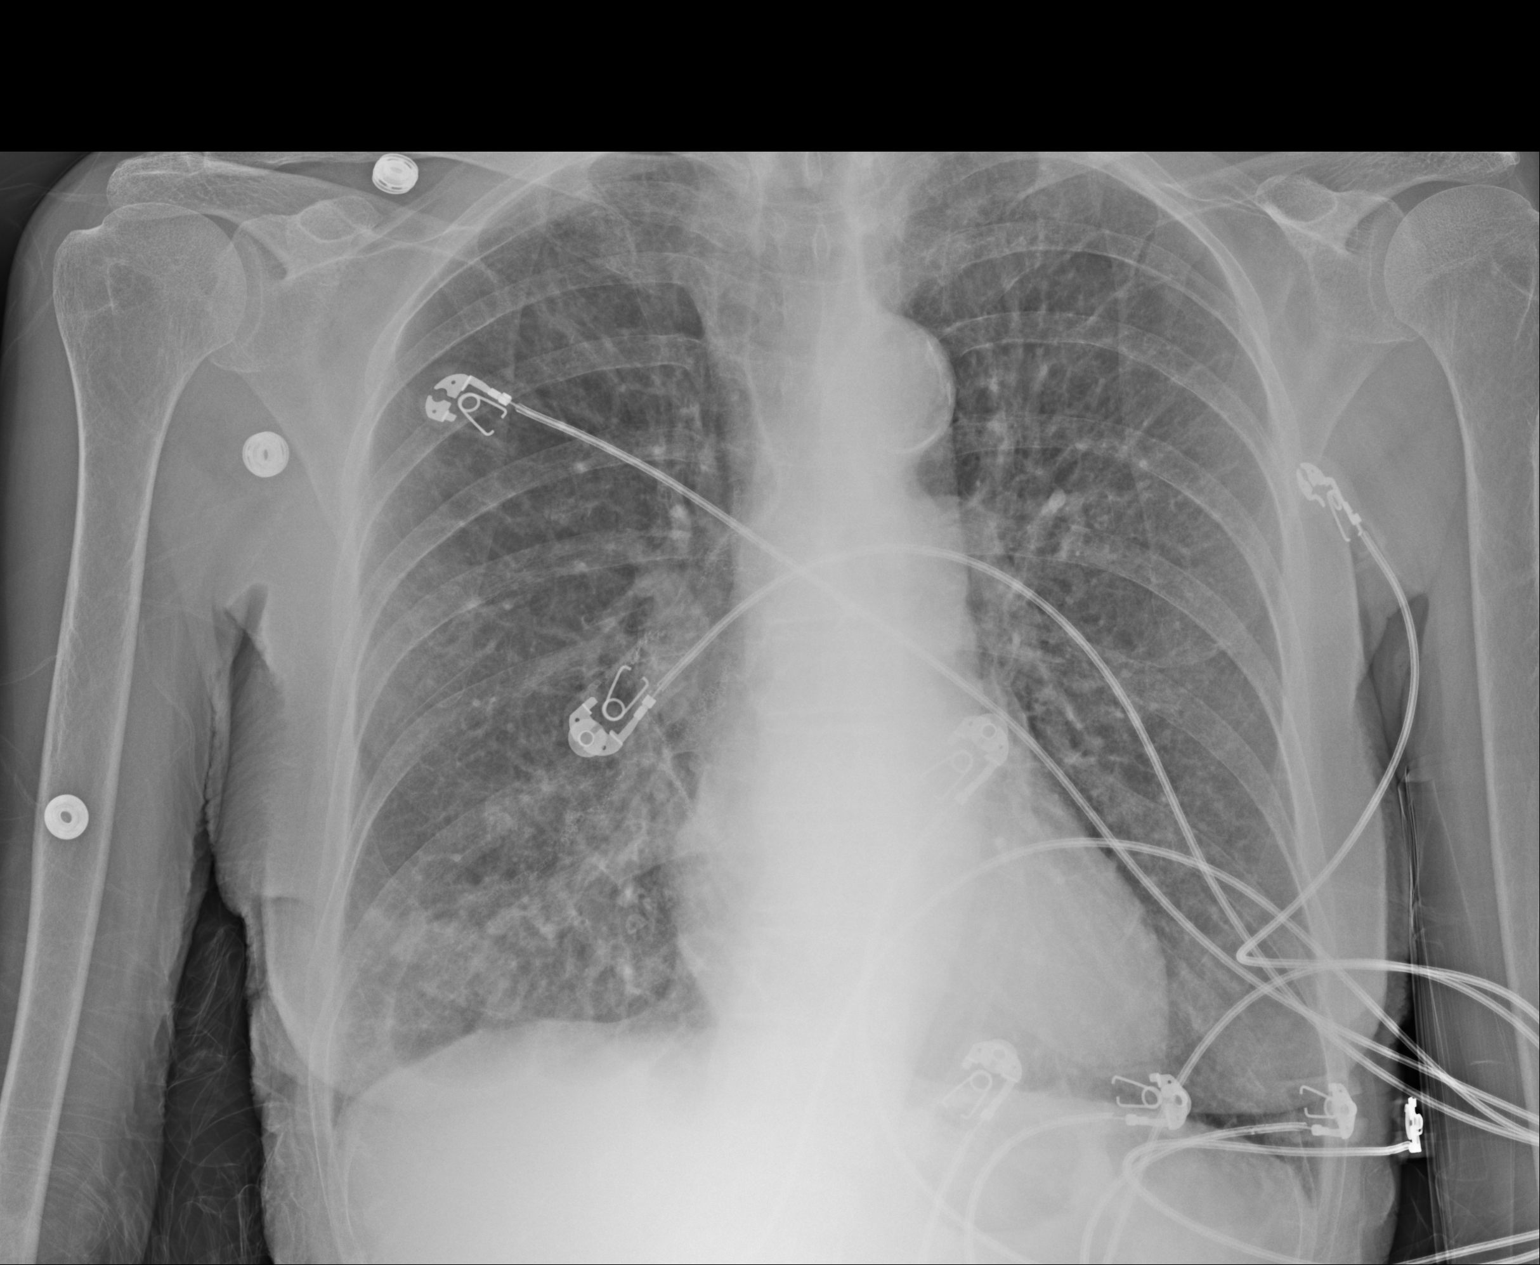

[1 of 1 positions shown; findings below may reference images not displayed]

FINDINGS: Nodular opacities remain in the right base without appreciable
change compared to recent studies. No new opacities are evident.
Lungs appear somewhat hyperexpanded. Heart size and pulmonary
vascularity are normal. There is atherosclerotic calcification
aorta. No adenopathy. No bone lesions. No pneumothorax.
IMPRESSION: Nodular opacities persist in the right base. Etiology uncertain;
please see recommendations from recent chest CT report. No new
opacity. Lungs somewhat hyperexpanded. Stable cardiac silhouette.
There is aortic atherosclerosis.

## 2017-11-29 ENCOUNTER — Ambulatory Visit (INDEPENDENT_AMBULATORY_CARE_PROVIDER_SITE_OTHER): Payer: PPO

## 2017-11-29 VITALS — BP 132/83 | HR 80 | Ht 66.0 in

## 2017-11-29 DIAGNOSIS — I493 Ventricular premature depolarization: Secondary | ICD-10-CM | POA: Diagnosis not present

## 2017-11-29 DIAGNOSIS — R001 Bradycardia, unspecified: Secondary | ICD-10-CM

## 2017-11-29 NOTE — Patient Instructions (Signed)
Medication Instructions:  Your physician recommends that you continue on your current medications as directed. Please refer to the Current Medication list given to you today.  Labwork: None ordered.  Testing/Procedures: None ordered.  Follow-Up: Follow up with Dr. Burt Knack December 27, 2017 as scheduled. He will contact you if he wants to make any medication changes prior to this appt.  Any Other Special Instructions Will Be Listed Below (If Applicable).  If you need a refill on your cardiac medications before your next appointment, please call your pharmacy.

## 2017-11-29 NOTE — Progress Notes (Signed)
1.) Reason for visit: Repeat EKG after stopping metoprolol d/t fatigue  2.) Name of MD requesting visit: Kathlen Mody, S  3.) H&P:  PVC's, COPD  4.) ROS related to problem: Pt started metoprolol 09/24/2017 d/t frequent PVC's (22% burden per last holter).  Pt c/o fatigue and discontinued metoprolol 11/24/2017.  Pt states she is starting to have more energy off the metoprolol.   Pt has difficulty breathing and talking.    5.) Assessment and plan per MD: DOD reviewed EKG.  Pt with frequent PVC's.  DOD recommended Pt take beta blocker.  Discussed with Pt.  Pt again states she cannot tolerate metoprolol.

## 2017-12-02 ENCOUNTER — Telehealth: Payer: Self-pay | Admitting: *Deleted

## 2017-12-02 ENCOUNTER — Telehealth: Payer: Self-pay | Admitting: Physician Assistant

## 2017-12-02 NOTE — Progress Notes (Signed)
Ok.  Remain off of beta-blocker. I will review with Dr. Burt Knack +/- referral to EP for PVCs. Richardson Dopp, PA-C    12/02/2017 9:51 AM

## 2017-12-02 NOTE — Telephone Encounter (Signed)
Information has been submitted to pts insurance for verification of benefits. Awaiting response for coverage  

## 2017-12-02 NOTE — Telephone Encounter (Signed)
Please tell Ms. Jablonski that I did review her case with Dr. Burt Knack. We think it would be worthwhile for her to see EP for her PVCs especially since she cannot take a beta-blocker (Metoprolol).  PLAN:  1. Please refer to EP for evaluation and management of PVCs. Richardson Dopp, PA-C    12/02/2017 4:33 PM

## 2017-12-06 NOTE — Telephone Encounter (Signed)
Pt scheduled with Dr. Curt Bears for EP eval.

## 2017-12-08 ENCOUNTER — Other Ambulatory Visit: Payer: Self-pay | Admitting: Emergency Medicine

## 2017-12-09 ENCOUNTER — Encounter: Payer: Self-pay | Admitting: Family Medicine

## 2017-12-09 ENCOUNTER — Ambulatory Visit (INDEPENDENT_AMBULATORY_CARE_PROVIDER_SITE_OTHER): Payer: PPO | Admitting: Family Medicine

## 2017-12-09 VITALS — BP 116/80 | HR 81 | Temp 97.8°F | Ht 66.0 in | Wt 125.5 lb

## 2017-12-09 DIAGNOSIS — I10 Essential (primary) hypertension: Secondary | ICD-10-CM | POA: Diagnosis not present

## 2017-12-09 DIAGNOSIS — Z8673 Personal history of transient ischemic attack (TIA), and cerebral infarction without residual deficits: Secondary | ICD-10-CM

## 2017-12-09 DIAGNOSIS — I255 Ischemic cardiomyopathy: Secondary | ICD-10-CM

## 2017-12-09 DIAGNOSIS — K219 Gastro-esophageal reflux disease without esophagitis: Secondary | ICD-10-CM

## 2017-12-09 DIAGNOSIS — M810 Age-related osteoporosis without current pathological fracture: Secondary | ICD-10-CM | POA: Diagnosis not present

## 2017-12-09 DIAGNOSIS — Z0000 Encounter for general adult medical examination without abnormal findings: Secondary | ICD-10-CM | POA: Diagnosis not present

## 2017-12-09 DIAGNOSIS — J449 Chronic obstructive pulmonary disease, unspecified: Secondary | ICD-10-CM

## 2017-12-09 DIAGNOSIS — Z66 Do not resuscitate: Secondary | ICD-10-CM

## 2017-12-09 DIAGNOSIS — R7303 Prediabetes: Secondary | ICD-10-CM | POA: Diagnosis not present

## 2017-12-09 DIAGNOSIS — N183 Chronic kidney disease, stage 3 unspecified: Secondary | ICD-10-CM

## 2017-12-09 DIAGNOSIS — Z7189 Other specified counseling: Secondary | ICD-10-CM

## 2017-12-09 DIAGNOSIS — I5042 Chronic combined systolic (congestive) and diastolic (congestive) heart failure: Secondary | ICD-10-CM | POA: Diagnosis not present

## 2017-12-09 DIAGNOSIS — E559 Vitamin D deficiency, unspecified: Secondary | ICD-10-CM | POA: Diagnosis not present

## 2017-12-09 DIAGNOSIS — E785 Hyperlipidemia, unspecified: Secondary | ICD-10-CM | POA: Diagnosis not present

## 2017-12-09 NOTE — Assessment & Plan Note (Signed)
Chronic, stable. Continue current regimen. 

## 2017-12-09 NOTE — Assessment & Plan Note (Signed)
Chronic, stable. Continue to monitor.

## 2017-12-09 NOTE — Assessment & Plan Note (Signed)
Chronic, deteriorated. Activity limited by dyspnea. Continue prolia - started 2017.

## 2017-12-09 NOTE — Assessment & Plan Note (Signed)
Continue aspirin, plavix.

## 2017-12-09 NOTE — Assessment & Plan Note (Signed)
Preventative protocols reviewed and updated unless pt declined. Discussed healthy diet and lifestyle.  

## 2017-12-09 NOTE — Assessment & Plan Note (Signed)
Has been unable to tolerate beta blockers in h/o PVCs - planned eval by EP next month.

## 2017-12-09 NOTE — Assessment & Plan Note (Signed)
Advanced directive discussion - Son or niece are HCPOA. She desires DNR. Will bring [place] copy.

## 2017-12-09 NOTE — Progress Notes (Signed)
BP 116/80 (BP Location: Left Arm, Patient Position: Sitting, Cuff Size: Normal)   Pulse 81   Temp 97.8 F (36.6 C) (Oral)   Ht 5\' 6"  (1.676 m)   Wt 125 lb 8 oz (56.9 kg)   SpO2 96%   BMI 20.26 kg/m    CC: CPE Subjective:    Patient ID: [name], female    DOB: [birth year], 77 y.o.   MRN: [id number]  HPI: [name] is a 77 y.o. 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  Osteoporosis - awaiting prolia coverage verification  COPD - followed by pulm Dr Lamonte Sakai on scheduled duonebs. She is not regular with oxygen. Rec continuous oxygen. Feels dyspnea is getting worse.   Recent tick bite - note reviewed. This has cleared up.   Saw Lesia last week for medicare wellness visit. Note reviewed.   Preventative: Colon cancer screening - does not want colonoscopy - yearly stool kits normal last 04/2017 H/o lung cancer - s/p resection 2008, previously saw Dr Earlie Server, now seeing Dr Lamonte Sakai. Breast cancer screening - mammo 07/2017 birads 1 Well woman exam - s/p partial hysterectomy for irregular periods, ovaries remain. Denies any concerns or skin changes or pelvic fullness.  DEXA scan - progression 07/2017 T score -4.7 forearm, -4.1 hip. Currently on prolia. Actonel caused bone pain. Takes calcium/vit d x2 and vit D 2000 x1. Activity limited by dyspnea.  Flu shot yearly Tetanus shot - unsure Pneumovax 2011, prevnar 2016 Zostavax - 2013  Shingrix - will check with pharmacy about shingrix.  Advanced directive discussion - Son or niece are HCPOA. She desires DNR. Will bring [place] copy.  Seat belt use discussed Sunscreen use discussed, no changing moles on skin Quit smoking 2004 Alcohol - none Dentist - tries yearly Eye exam - 08/2017  The patient is a widow and has one son and one dog.  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 She does not drink alcohol.   Ed: HS Activity: no regular exercise - activity limited by dyspnea Diet: some water, fruits/vegetables daily  Relevant past medical, surgical, family and social history reviewed and updated as indicated. Interim medical history since our last visit reviewed. Allergies and medications reviewed and updated. Outpatient Medications Prior to Visit  Medication Sig Dispense Refill  . albuterol (PROAIR HFA) 108 (90 Base) MCG/ACT inhaler Inhale 2 puffs into the lungs every 4 (four) hours as needed for wheezing or shortness of breath. 1 Inhaler 5  . albuterol (PROVENTIL) (2.5 MG/3ML) 0.083% nebulizer solution Take 3 mLs (2.5 mg total) by nebulization every 4 (four) hours as needed for wheezing or shortness of breath. 150 mL 5  . aspirin EC 81 MG tablet Take 1 tablet (81 mg total) by mouth daily.    . calcium carbonate (OS-CAL - DOSED IN MG OF ELEMENTAL CALCIUM) 1250 (500 Ca) MG tablet Take 1 tablet by mouth 2 (two) times daily with a meal.    . clopidogrel (PLAVIX) 75 MG tablet Take 1 tablet (75 mg total) by mouth daily. 30 tablet 11  . denosumab (PROLIA) 60 MG/ML SOLN injection Inject 60 mg into the skin every 6 (six) months. Administer in upper arm, thigh, or abdomen 60 mL 1  . fluticasone (FLONASE) 50 MCG/ACT nasal spray INSTILL 2 SPRAYS INTO BOTH NOSTRILS ONCEDAILY AS DIRECTED 16 g 5  . furosemide (  LASIX) 20 MG tablet Take 1 tablet (20 mg total) by mouth daily. Take an additional 1/2 tablet daily for swelling. 40 tablet 11  . ipratropium-albuterol (DUONEB) 0.5-2.5 (3) MG/3ML SOLN INHALE 1 VIAL VIA NEBULIZER FOUR TIMES ADAY AND EVERY 4 HOURS AS NEEDED AS DIRECTED 360 mL 5  . isosorbide mononitrate (IMDUR) 30 MG 24 hr tablet TAKE 1 TABLET BY MOUTH DAILY 30 tablet 10  . loratadine (CLARITIN) 10 MG tablet Take 10 mg by mouth daily as needed for allergies or rhinitis.     . LOTEMAX 0.5 % ophthalmic suspension Place 1 drop into both eyes daily.     . nitroGLYCERIN (NITROSTAT) 0.4 MG SL tablet Place 1  tablet (0.4 mg total) under the tongue every 5 (five) minutes as needed for chest pain. 25 tablet 3  . pantoprazole (PROTONIX) 40 MG tablet TAKE 1 TABLET BY MOUTH ONCE DAILY 30 tablet 11  . prednisoLONE acetate (PRED FORTE) 1 % ophthalmic suspension 1 drop daily. Both eyes    . simvastatin (ZOCOR) 40 MG tablet TAKE 1 TABLET BY MOUTH AT BEDTIME 30 tablet 11  . Spacer/Aero-Holding Chambers (AEROCHAMBER MV) inhaler Use as instructed 1 each 0  . spironolactone (ALDACTONE) 25 MG tablet Take 1 tablet (25 mg total) by mouth daily. 45 tablet 3   No facility-administered medications prior to visit.      Per HPI unless specifically indicated in ROS section below Review of Systems  Constitutional: Negative for activity change, appetite change (decreased), chills, fatigue, fever and unexpected weight change.  HENT: Negative for hearing loss.   Eyes: Negative for visual disturbance.  Respiratory: Positive for cough and shortness of breath. Negative for chest tightness and wheezing.   Cardiovascular: Positive for chest pain. Negative for palpitations and leg swelling.  Gastrointestinal: Negative for abdominal distention, abdominal pain, blood in stool, constipation, diarrhea, nausea and vomiting.  Genitourinary: Negative for difficulty urinating and hematuria.  Musculoskeletal: Negative for arthralgias, myalgias and neck pain.  Skin: Negative for rash.  Neurological: Negative for dizziness, seizures, syncope and headaches.  Hematological: Negative for adenopathy. Bruises/bleeds easily.  Psychiatric/Behavioral: Negative for dysphoric mood. The patient is not nervous/anxious.        Objective:    BP 116/80 (BP Location: Left Arm, Patient Position: Sitting, Cuff Size: Normal)   Pulse 81   Temp 97.8 F (36.6 C) (Oral)   Ht 5\' 6"  (1.676 m)   Wt 125 lb 8 oz (56.9 kg)   SpO2 96%   BMI 20.26 kg/m   Wt Readings from Last 3 Encounters:  12/09/17 125 lb 8 oz (56.9 kg)  11/26/17 124 lb 8 oz (56.5 kg)    11/25/17 123 lb 8 oz (56 kg)    Physical Exam  Constitutional: She is oriented to person, place, and time. She appears well-developed and well-nourished. No distress.  HENT:  Head: Normocephalic and atraumatic.  Right Ear: Hearing, tympanic membrane, external ear and ear canal normal.  Left Ear: Hearing, tympanic membrane, external ear and ear canal normal.  Nose: Nose normal.  Mouth/Throat: Uvula is midline, oropharynx is clear and moist and mucous membranes are normal. No oropharyngeal exudate, posterior oropharyngeal edema or posterior oropharyngeal erythema.  Eyes: Pupils are equal, round, and reactive to light. Conjunctivae and EOM are normal. No scleral icterus.  Neck: Normal range of motion. Neck supple. No thyromegaly present.  Cardiovascular: Normal rate, regular rhythm, normal heart sounds and intact distal pulses.  No murmur heard. Pulses:      Radial pulses are  2+ on the right side, and 2+ on the left side.  Pulmonary/Chest: Effort normal and breath sounds normal. No respiratory distress. She has no wheezes. She has no rales.  Marked orthopnea  Abdominal: Soft. Bowel sounds are normal. She exhibits no distension and no mass. There is no tenderness. There is no rebound and no guarding.  Musculoskeletal: Normal range of motion. She exhibits no edema.  Lymphadenopathy:    She has no cervical adenopathy.  Neurological: She is alert and oriented to person, place, and time.  CN grossly intact, station and gait intact  Skin: Skin is warm and dry. No rash noted.  Psychiatric: She has a normal mood and affect. Her behavior is normal. Judgment and thought content normal.  Nursing note and vitals reviewed.  Results for orders placed or performed in visit on 11/26/17  VITAMIN D 25 Hydroxy (Vit-D Deficiency, Fractures)  Result Value Ref Range   VITD 85.18 30.00 - 100.00 ng/mL  Microalbumin / creatinine urine ratio  Result Value Ref Range   Microalb, Ur <0.7 0.0 - 1.9 mg/dL    Creatinine,U 56.3 mg/dL   Microalb Creat Ratio 1.2 0.0 - 30.0 mg/g  CBC with Differential/Platelet  Result Value Ref Range   WBC 7.3 4.0 - 10.5 K/uL   RBC 4.69 3.87 - 5.11 Mil/uL   Hemoglobin 15.0 12.0 - 15.0 g/dL   HCT 44.8 36.0 - 46.0 %   MCV 95.4 78.0 - 100.0 fl   MCHC 33.5 30.0 - 36.0 g/dL   RDW 14.7 11.5 - 15.5 %   Platelets 283.0 150.0 - 400.0 K/uL   Neutrophils Relative % 48.7 43.0 - 77.0 %   Lymphocytes Relative 26.8 12.0 - 46.0 %   Monocytes Relative 8.7 3.0 - 12.0 %   Eosinophils Relative 14.8 Repeated and verified X2. (H) 0.0 - 5.0 %   Basophils Relative 1.0 0.0 - 3.0 %   Neutro Abs 3.5 1.4 - 7.7 K/uL   Lymphs Abs 1.9 0.7 - 4.0 K/uL   Monocytes Absolute 0.6 0.1 - 1.0 K/uL   Eosinophils Absolute 1.1 (H) 0.0 - 0.7 K/uL   Basophils Absolute 0.1 0.0 - 0.1 K/uL  Hemoglobin A1c  Result Value Ref Range   Hgb A1c MFr Bld 6.1 4.6 - 6.5 %  Lipid panel  Result Value Ref Range   Cholesterol 155 0 - 200 mg/dL   Triglycerides 63.0 0.0 - 149.0 mg/dL   HDL 85.30 >39.00 mg/dL   VLDL 12.6 0.0 - 40.0 mg/dL   LDL Cholesterol 57 0 - 99 mg/dL   Total CHOL/HDL Ratio 2    NonHDL 69.55   Comprehensive metabolic panel  Result Value Ref Range   Sodium 139 135 - 145 mEq/L   Potassium 4.5 3.5 - 5.1 mEq/L   Chloride 101 96 - 112 mEq/L   CO2 29 19 - 32 mEq/L   Glucose, Bld 89 70 - 99 mg/dL   BUN 19 6 - 23 mg/dL   Creatinine, Ser 1.03 0.40 - 1.20 mg/dL   Total Bilirubin 0.6 0.2 - 1.2 mg/dL   Alkaline Phosphatase 73 39 - 117 U/L   AST 23 0 - 37 U/L   ALT 13 0 - 35 U/L   Total Protein 7.4 6.0 - 8.3 g/dL   Albumin 4.3 3.5 - 5.2 g/dL   Calcium 10.3 8.4 - 10.5 mg/dL   GFR 55.23 (L) >60.00 mL/min      Assessment & Plan:   Problem List Items Addressed This Visit  Advanced care planning/counseling discussion    Advanced directive discussion - Son or niece are HCPOA. She desires DNR. Will bring [place] copy.       Chronic combined systolic and diastolic heart failure (HCC)    Chronic,  stable. Continue current regimen.       CKD (chronic kidney disease) stage 3, GFR 30-59 ml/min (HCC)    Chronic, stable. Continue to monitor.       COPD (chronic obstructive pulmonary disease) (HCC)    Chronic, severe. Encouraged more regular oxygen use due to chronic dyspnea. She states she will consider this.       DNR (do not resuscitate)   Essential hypertension    Chronic, stable. Continue current regimen.       GERD    Continue daily PPI.       Health maintenance examination - Primary    Preventative protocols reviewed and updated unless pt declined. Discussed healthy diet and lifestyle.       History of CVA (cerebrovascular accident) without residual deficits    Continue aspirin, plavix.       HLD (hyperlipidemia)    Chronic, stable. Continue current simvastatin The ASCVD Risk score Mikey Bussing DC Jr., et al., 2013) failed to calculate for the following reasons:   The patient has a prior MI or stroke diagnosis       Ischemic cardiomyopathy    Has been unable to tolerate beta blockers in h/o PVCs - planned eval by EP next month.       Osteoporosis    Chronic, deteriorated. Activity limited by dyspnea. Continue prolia - started 2017.       Prediabetes    Chronic, stable.      Vitamin D insufficiency    Chronic, stable. Continue vitamin D.           No orders of the defined types were placed in this encounter.  No orders of the defined types were placed in this encounter.   Follow up plan: Return in about 6 months (around 06/11/2018) for follow up visit.  Ria Bush, MD

## 2017-12-09 NOTE — Assessment & Plan Note (Signed)
Continue daily PPI.

## 2017-12-09 NOTE — Assessment & Plan Note (Signed)
Chronic, stable. Continue vitamin D.

## 2017-12-09 NOTE — Assessment & Plan Note (Signed)
Chronic, severe. Encouraged more regular oxygen use due to chronic dyspnea. She states she will consider this.

## 2017-12-09 NOTE — Assessment & Plan Note (Signed)
Chronic, stable. Continue current simvastatin The ASCVD Risk score Jodi Bussing DC Jr., et al., 2013) failed to calculate for the following reasons:   The patient has a prior MI or stroke diagnosis

## 2017-12-09 NOTE — Assessment & Plan Note (Signed)
Chronic, stable 

## 2017-12-09 NOTE — Patient Instructions (Addendum)
If interested, check with pharmacy about new 2 shot shingles series (shingrix).  Bring [place] copy of advanced directives to update your chart.  Return as needed or in 6 months for follow up visit Consider more regular use of oxygen to facilitate increased activity.   Health Maintenance, Female Adopting a healthy lifestyle and getting preventive care can go a long way to promote health and wellness. Talk with your health care provider about what schedule of regular examinations is right for you. This is a good chance for you to check in with your provider about disease prevention and staying healthy. In between checkups, there are plenty of things you can do on your own. Experts have done a lot of research about which lifestyle changes and preventive measures are most likely to keep you healthy. Ask your health care provider for more information. Weight and diet Eat a healthy diet  Be sure to include plenty of vegetables, fruits, low-fat dairy products, and lean protein.  Do not eat a lot of foods high in solid fats, added sugars, or salt.  Get regular exercise. This is one of the most important things you can do for your health. ? Most adults should exercise for at least 150 minutes each week. The exercise should increase your heart rate and make you sweat (moderate-intensity exercise). ? Most adults should also do strengthening exercises at least twice a week. This is in addition to the moderate-intensity exercise.  Maintain a healthy weight  Body mass index (BMI) is a measurement that can be used to identify possible weight problems. It estimates body fat based on height and weight. Your health care provider can help determine your BMI and help you achieve or maintain a healthy weight.  For females 31 years of age and older: ? A BMI below 18.5 is considered underweight. ? A BMI of 18.5 to 24.9 is normal. ? A BMI of 25 to 29.9 is considered overweight. ? A BMI of 30 and above is considered  obese.  Watch levels of cholesterol and blood lipids  You should start having your blood tested for lipids and cholesterol at 77 years of age, then have this test every 5 years.  You may need to have your cholesterol levels checked more often if: ? Your lipid or cholesterol levels are high. ? You are older than 77 years of age. ? You are at high risk for heart disease.  Cancer screening Lung Cancer  Lung cancer screening is recommended for adults 36-61 years old who are at high risk for lung cancer because of a history of smoking.  A yearly low-dose CT scan of the lungs is recommended for people who: ? Currently smoke. ? Have quit within the past 15 years. ? Have at least a 30-pack-year history of smoking. A pack year is smoking an average of one pack of cigarettes a day for 1 year.  Yearly screening should continue until it has been 15 years since you quit.  Yearly screening should stop if you develop a health problem that would prevent you from having lung cancer treatment.  Breast Cancer  Practice breast self-awareness. This means understanding how your breasts normally appear and feel.  It also means doing regular breast self-exams. Let your health care provider know about any changes, no matter how small.  If you are in your 20s or 30s, you should have a clinical breast exam (CBE) by a health care provider every 1-3 years as part of a regular health exam.  If you are 40 or older, have a CBE every year. Also consider having a breast X-ray (mammogram) every year.  If you have a family history of breast cancer, talk to your health care provider about genetic screening.  If you are at high risk for breast cancer, talk to your health care provider about having an MRI and a mammogram every year.  Breast cancer gene (BRCA) assessment is recommended for women who have family members with BRCA-related cancers. BRCA-related cancers  include: ? Breast. ? Ovarian. ? Tubal. ? Peritoneal cancers.  Results of the assessment will determine the need for genetic counseling and BRCA1 and BRCA2 testing.  Cervical Cancer Your health care provider may recommend that you be screened regularly for cancer of the pelvic organs (ovaries, uterus, and vagina). This screening involves a pelvic examination, including checking for microscopic changes to the surface of your cervix (Pap test). You may be encouraged to have this screening done every 3 years, beginning at age 21.  For women ages 30-65, health care providers may recommend pelvic exams and Pap testing every 3 years, or they may recommend the Pap and pelvic exam, combined with testing for human papilloma virus (HPV), every 5 years. Some types of HPV increase your risk of cervical cancer. Testing for HPV may also be done on women of any age with unclear Pap test results.  Other health care providers may not recommend any screening for nonpregnant women who are considered low risk for pelvic cancer and who do not have symptoms. Ask your health care provider if a screening pelvic exam is right for you.  If you have had past treatment for cervical cancer or a condition that could lead to cancer, you need Pap tests and screening for cancer for at least 20 years after your treatment. If Pap tests have been discontinued, your risk factors (such as having a new sexual partner) need to be reassessed to determine if screening should resume. Some women have medical problems that increase the chance of getting cervical cancer. In these cases, your health care provider may recommend more frequent screening and Pap tests.  Colorectal Cancer  This type of cancer can be detected and often prevented.  Routine colorectal cancer screening usually begins at 77 years of age and continues through 77 years of age.  Your health care provider may recommend screening at an earlier age if you have risk factors  for colon cancer.  Your health care provider may also recommend using home test kits to check for hidden blood in the stool.  A small camera at the end of a tube can be used to examine your colon directly (sigmoidoscopy or colonoscopy). This is done to check for the earliest forms of colorectal cancer.  Routine screening usually begins at age 50.  Direct examination of the colon should be repeated every 5-10 years through 77 years of age. However, you may need to be screened more often if early forms of precancerous polyps or small growths are found.  Skin Cancer  Check your skin from head to toe regularly.  Tell your health care provider about any new moles or changes in moles, especially if there is a change in a mole's shape or color.  Also tell your health care provider if you have a mole that is larger than the size of a pencil eraser.  Always use sunscreen. Apply sunscreen liberally and repeatedly throughout the day.  Protect yourself by wearing long sleeves, pants, a wide-brimmed hat, and   sunglasses whenever you are outside.  Heart disease, diabetes, and high blood pressure  High blood pressure causes heart disease and increases the risk of stroke. High blood pressure is more likely to develop in: ? People who have blood pressure in the high end of the normal range (130-139/85-89 mm Hg). ? People who are overweight or obese. ? People who are African American.  If you are 25-70 years of age, have your blood pressure checked every 3-5 years. If you are 86 years of age or older, have your blood pressure checked every year. You should have your blood pressure measured twice-once when you are at a hospital or clinic, and once when you are not at a hospital or clinic. Record the average of the two measurements. To check your blood pressure when you are not at a hospital or clinic, you can use: ? An automated blood pressure machine at a pharmacy. ? A home blood pressure monitor.  If  you are between 34 years and 33 years old, ask your health care provider if you should take aspirin to prevent strokes.  Have regular diabetes screenings. This involves taking a blood sample to check your fasting blood sugar level. ? If you are at a normal weight and have a low risk for diabetes, have this test once every three years after 77 years of age. ? If you are overweight and have a high risk for diabetes, consider being tested at a younger age or more often. Preventing infection Hepatitis B  If you have a higher risk for hepatitis B, you should be screened for this virus. You are considered at high risk for hepatitis B if: ? You were born in a country where hepatitis B is common. Ask your health care provider which countries are considered high risk. ? Your parents were born in a high-risk country, and you have not been immunized against hepatitis B (hepatitis B vaccine). ? You have HIV or AIDS. ? You use needles to inject street drugs. ? You live with someone who has hepatitis B. ? You have had sex with someone who has hepatitis B. ? You get hemodialysis treatment. ? You take certain medicines for conditions, including cancer, organ transplantation, and autoimmune conditions.  Hepatitis C  Blood testing is recommended for: ? Everyone born from [birth year] through 1965. ? Anyone with known risk factors for hepatitis C.  Sexually transmitted infections (STIs)  You should be screened for sexually transmitted infections (STIs) including gonorrhea and chlamydia if: ? You are sexually active and are younger than 77 years of age. ? You are older than 77 years of age and your health care provider tells you that you are at risk for this type of infection. ? Your sexual activity has changed since you were last screened and you are at an increased risk for chlamydia or gonorrhea. Ask your health care provider if you are at risk.  If you do not have HIV, but are at risk, it may be recommended  that you take a prescription medicine daily to prevent HIV infection. This is called pre-exposure prophylaxis (PrEP). You are considered at risk if: ? You are sexually active and do not regularly use condoms or know the HIV status of your partner(s). ? You take drugs by injection. ? You are sexually active with a partner who has HIV.  Talk with your health care provider about whether you are at high risk of being infected with HIV. If you choose to begin PrEP, you  should first be tested for HIV. You should then be tested every 3 months for as long as you are taking PrEP. Pregnancy  If you are premenopausal and you may become pregnant, ask your health care provider about preconception counseling.  If you may become pregnant, take 400 to 800 micrograms (mcg) of folic acid every day.  If you want to prevent pregnancy, talk to your health care provider about birth control (contraception). Osteoporosis and menopause  Osteoporosis is a disease in which the bones lose minerals and strength with aging. This can result in serious bone fractures. Your risk for osteoporosis can be identified using a bone density scan.  If you are 65 years of age or older, or if you are at risk for osteoporosis and fractures, ask your health care provider if you should be screened.  Ask your health care provider whether you should take a calcium or vitamin D supplement to lower your risk for osteoporosis.  Menopause may have certain physical symptoms and risks.  Hormone replacement therapy may reduce some of these symptoms and risks. Talk to your health care provider about whether hormone replacement therapy is right for you. Follow these instructions at home:  Schedule regular health, dental, and eye exams.  Stay current with your immunizations.  Do not use any tobacco products including cigarettes, chewing tobacco, or electronic cigarettes.  If you are pregnant, do not drink alcohol.  If you are  breastfeeding, limit how much and how often you drink alcohol.  Limit alcohol intake to no more than 1 drink per day for nonpregnant women. One drink equals 12 ounces of beer, 5 ounces of wine, or 1 ounces of hard liquor.  Do not use street drugs.  Do not share needles.  Ask your health care provider for help if you need support or information about quitting drugs.  Tell your health care provider if you often feel depressed.  Tell your health care provider if you have ever been abused or do not feel safe at home. This information is not intended to replace advice given to you by your health care provider. Make sure you discuss any questions you have with your health care provider. Document Released: 01/26/2011 Document Revised: 12/19/2015 Document Reviewed: 04/16/2015 Elsevier Interactive Patient Education  2018 [employer]  

## 2017-12-13 ENCOUNTER — Other Ambulatory Visit: Payer: Self-pay | Admitting: *Deleted

## 2017-12-13 NOTE — Patient Outreach (Signed)
[facility]) Care Management  12/13/2017  [name] 1941/07/11 [id number]   9 am- Received a return call from pt to voice message left earlier, HIPPA identifiers  Verified by pt.  Pt reports doing good but then states breathing not any better, using oxygen more than she used to, helping some.  Pt reports stop taking Metoprolol after seeing Lung MD,had no energy when taking it, Heart MD is aware.  Pt reports referred by Richardson Dopp PA  To follow up with Dr.  Curt Bears  5/30.  Pt reports to have PET scan in July to check if have cancer, then follow up with Lung MD that month.  Pt reports taking all of her medications.   RN CM discussed with pt discharging from [facility], no further case management needs to which pt agreed.  Pt declined being followed telephonically by [facility].   Plan:  As discussed with pt, plan to close her case.           Plan to inform Dr. Danise Mina of discharge, send case closure letter.   [name].   [place] [facility]  [phone]

## 2017-12-13 NOTE — Patient Outreach (Signed)
[place] [facility]) Care Management  12/13/2017  [name] 20-Jan-1941 [id number]   Unsuccessful telephone encounter to [name], 77 year old female check on current clinical status.  RN CM following pt for chronic disease management of COPD (severe).   HIPAA compliant voice message left on pt's home phone as well as mobile.   Plan:  If no response to voice messages left, plan to send unsuccessful outreach letter and if  No response in 10 business days to close case.  RN CM to make next outreach call in 3-4  Business days.   [name].   [place] [facility]  [phone]

## 2017-12-19 DIAGNOSIS — R269 Unspecified abnormalities of gait and mobility: Secondary | ICD-10-CM | POA: Diagnosis not present

## 2017-12-19 DIAGNOSIS — J449 Chronic obstructive pulmonary disease, unspecified: Secondary | ICD-10-CM | POA: Diagnosis not present

## 2017-12-19 DIAGNOSIS — E785 Hyperlipidemia, unspecified: Secondary | ICD-10-CM | POA: Diagnosis not present

## 2017-12-19 DIAGNOSIS — R911 Solitary pulmonary nodule: Secondary | ICD-10-CM | POA: Diagnosis not present

## 2017-12-23 ENCOUNTER — Ambulatory Visit: Payer: PPO | Admitting: Cardiology

## 2017-12-23 ENCOUNTER — Encounter (INDEPENDENT_AMBULATORY_CARE_PROVIDER_SITE_OTHER): Payer: Self-pay

## 2017-12-23 ENCOUNTER — Encounter: Payer: Self-pay | Admitting: Cardiology

## 2017-12-23 VITALS — BP 130/88 | HR 52 | Ht 66.0 in | Wt 125.8 lb

## 2017-12-23 DIAGNOSIS — I2510 Atherosclerotic heart disease of native coronary artery without angina pectoris: Secondary | ICD-10-CM | POA: Diagnosis not present

## 2017-12-23 DIAGNOSIS — I10 Essential (primary) hypertension: Secondary | ICD-10-CM

## 2017-12-23 DIAGNOSIS — I493 Ventricular premature depolarization: Secondary | ICD-10-CM

## 2017-12-23 DIAGNOSIS — I428 Other cardiomyopathies: Secondary | ICD-10-CM

## 2017-12-23 MED ORDER — MEXILETINE HCL 150 MG PO CAPS: 150.0000 mg | ORAL_CAPSULE | Freq: Two times a day (BID) | ORAL | 3 refills | Status: DC

## 2017-12-23 NOTE — Progress Notes (Signed)
Electrophysiology Office Note   Date:  12/23/2017   ID:  [name], DOB 03-03-1941, MRN [id number]  PCP:  Ria [place], MD  Cardiologist:  Burt Knack Primary Electrophysiologist:  Rashika Bettes Meredith Leeds, MD    Chief Complaint  Patient presents with  . Advice Only    PVC's     History of Present Illness: [name] is a 77 y.o. female who is being seen today for the evaluation of PVCs, CHF at the request of Sherren Mocha. Presenting today for electrophysiology evaluation.  He is a history of coronary artery disease, chronic systolic heart failure, prior stroke, non-small cell lung cancer post resection, and COPD.  She had an MI in 2010 secondary to occluded circumflex which was treated with PCI with a bare-metal stent.  She had an anterior ST elevation MI in 2017 secondary to occluded apical LAD which was treated medically.  She has been unable to tolerate beta-blockers or ACE inhibitors secondary to lung disease.  She suffered a non-ST elevation MI in 2017.  Cath showed significant coronary disease with a patent circumflex stent.  Holter monitor showed 22% PVCs as well as short ventricular runs.  She had an inferior STEMI December 2018 secondary to occluded apical PDA.  Balloon angioplasty was attempted but was unsuccessful.  Ticagrelor was stopped 2019 January due to dyspnea.    Today, she denies symptoms of palpitations, chest pain, shortness of breath, orthopnea, PND, lower extremity edema, claudication, dizziness, presyncope, syncope, bleeding, or neurologic sequela. The patient is tolerating medications without difficulties.  Chronic weakness and fatigue, she is also short of breath.  She is on oxygen for her COPD.  She wears her oxygen mainly at home and does not wear it when she is out of the house.  She is unaware of her PVCs other than the fact that she feels weak and fatigued.   Past Medical History:  Diagnosis Date  . Arthritis   . CAD (coronary artery disease) 03/2009    s/p MI  . Chronic combined systolic and diastolic heart failure ([place]) 06/17/2009   Qualifier: Diagnosis of  By: Burt Knack, MD, Clayburn Pert   . CKD (chronic kidney disease) stage 3, GFR 30-59 ml/min (HCC)   . Emphysema/COPD ([place])   . Ex-smoker quit 2008  . GERD (gastroesophageal reflux disease)   . HCAP (healthcare-associated pneumonia) 03/19/2016  . Helicobacter pylori gastritis 10/2007   treated  . History of CVA (cerebrovascular accident) without residual deficits 01/2012, 06/2012   R hemorrhagic MCA 01/2012 with remote lacunar infarct L putamen and IC, rpt 06/2012 acute multifocal R MCA infarct with remote hemorrhagic strokes affecting L basal ganglia and periventricular white matter, full recovery  . HLD (hyperlipidemia)   . HTN (hypertension)   . Ischemic cardiomyopathy 02/27/2014  . Lung cancer ([place]) dx'd 07/2006   Lung CA, s/p resection, followed by Dr Earlie Server  . Macular degeneration   . Myocardial infarction [facility]) 03/2009   Acute myocardial infarction 2010 - treated with BMS of LCx. LVEF 50% with subsequent CHF  . NSTEMI (non-ST elevated myocardial infarction) ([place])   . Osteoporosis 09/2013   T -3.6 forearm 09/2013, T -4.5 forearm 04/2015  . Stroke [facility])    Past Surgical History:  Procedure Laterality Date  . BREAST BIOPSY Left   . CARDIAC CATHETERIZATION N/A 02/24/2016   Procedure: Left Heart Cath and Coronary Angiography;  Surgeon: Belva Crome, MD;  Location: [place] [facility];  Service: Cardiovascular;  Laterality: N/A;  . CARDIAC CATHETERIZATION  N/A 07/09/2016   Procedure: Left Heart Cath and Coronary Angiography;  Surgeon: Peter M Martinique, MD;  Location: [place] [facility];  Service: Cardiovascular;  Laterality: N/A;  . CARDIAC CATHETERIZATION N/A 07/09/2016   Procedure: Coronary Stent Intervention;  Surgeon: Peter M Martinique, MD;  Location: [place] [facility];  Service: Cardiovascular;  Laterality: N/A;  . CATARACT EXTRACTION Bilateral   . CORONARY STENT INTERVENTION N/A  07/06/2017   Procedure: CORONARY STENT INTERVENTION;  Surgeon: Lorretta Harp, MD;  Location: [place] [facility];  Service: Cardiovascular;  Laterality: N/A;  . CORONARY STENT PLACEMENT  06/2016   DES to mid LAD  . CORONARY/GRAFT ACUTE MI REVASCULARIZATION N/A 07/06/2017   Procedure: Coronary/Graft Acute MI Revascularization;  Surgeon: Lorretta Harp, MD;  Location: [place] [facility];  Service: Cardiovascular;  Laterality: N/A;  . EYE SURGERY    . LEFT HEART CATH AND CORONARY ANGIOGRAPHY N/A 07/06/2017   Procedure: LEFT HEART CATH AND CORONARY ANGIOGRAPHY;  Surgeon: Lorretta Harp, MD;  Location: [place] [facility];  Service: Cardiovascular;  Laterality: N/A;  . LUNG REMOVAL, PARTIAL Right 2008  . PARTIAL HYSTERECTOMY  1986   irregular periods, ovaries remain     Current Outpatient Medications  Medication Sig Dispense Refill  . albuterol (PROAIR HFA) 108 (90 Base) MCG/ACT inhaler Inhale 2 puffs into the lungs every 4 (four) hours as needed for wheezing or shortness of breath. 1 Inhaler 5  . albuterol (PROVENTIL) (2.5 MG/3ML) 0.083% nebulizer solution Take 3 mLs (2.5 mg total) by nebulization every 4 (four) hours as needed for wheezing or shortness of breath. 150 mL 5  . aspirin EC 81 MG tablet Take 1 tablet (81 mg total) by mouth daily.    . calcium carbonate (OS-CAL - DOSED IN MG OF ELEMENTAL CALCIUM) 1250 (500 Ca) MG tablet Take 1 tablet by mouth 2 (two) times daily with a meal.    . clopidogrel (PLAVIX) 75 MG tablet Take 1 tablet (75 mg total) by mouth daily. 30 tablet 11  . denosumab (PROLIA) 60 MG/ML SOLN injection Inject 60 mg into the skin every 6 (six) months. Administer in upper arm, thigh, or abdomen 60 mL 1  . fluticasone (FLONASE) 50 MCG/ACT nasal spray INSTILL 2 SPRAYS INTO BOTH NOSTRILS ONCEDAILY AS DIRECTED 16 g 5  . furosemide (LASIX) 20 MG tablet Take 1 tablet (20 mg total) by mouth daily. Take an additional 1/2 tablet daily for swelling. 40 tablet 11  .  ipratropium-albuterol (DUONEB) 0.5-2.5 (3) MG/3ML SOLN INHALE 1 VIAL VIA NEBULIZER FOUR TIMES ADAY AND EVERY 4 HOURS AS NEEDED AS DIRECTED 360 mL 5  . isosorbide mononitrate (IMDUR) 30 MG 24 hr tablet TAKE 1 TABLET BY MOUTH DAILY 30 tablet 10  . loratadine (CLARITIN) 10 MG tablet Take 10 mg by mouth daily as needed for allergies or rhinitis.     . LOTEMAX 0.5 % ophthalmic suspension Place 1 drop into both eyes daily.     . nitroGLYCERIN (NITROSTAT) 0.4 MG SL tablet Place 1 tablet (0.4 mg total) under the tongue every 5 (five) minutes as needed for chest pain. 25 tablet 3  . pantoprazole (PROTONIX) 40 MG tablet TAKE 1 TABLET BY MOUTH ONCE DAILY 30 tablet 11  . prednisoLONE acetate (PRED FORTE) 1 % ophthalmic suspension 1 drop daily. Both eyes    . simvastatin (ZOCOR) 40 MG tablet TAKE 1 TABLET BY MOUTH AT BEDTIME 30 tablet 11  . Spacer/Aero-Holding Chambers (AEROCHAMBER MV) inhaler Use as instructed 1  each 0  . spironolactone (ALDACTONE) 25 MG tablet Take 1 tablet (25 mg total) by mouth daily. 45 tablet 3   No current facility-administered medications for this visit.     Allergies:   Prednisone; Actonel [risedronate sodium]; Amlodipine; Brilinta [ticagrelor]; Codeine; Fosamax [alendronate sodium]; Lisinopril; and Pravastatin   Social History:  The patient  reports that she quit smoking about 15 years ago. Her smoking use included cigarettes. She has never used smokeless tobacco. She reports that she does not drink alcohol or use drugs.   Family History:  The patient's family history includes Breast cancer in her sister; CAD (age of onset: 65) in her father and mother; Diabetes in her sister; Heart attack in her mother; Hypertension in her mother; Stroke in her father and mother.    ROS:  Please see the history of present illness.   Otherwise, review of systems is positive for weight, fatigue, short of breath, palpitations, cough, rash.   All other systems are reviewed and negative.    PHYSICAL  EXAM: VS:  BP 130/88   Pulse (!) 52   Ht 5\' 6"  (1.676 m)   Wt 125 lb 12.8 oz (57.1 kg)   SpO2 94%   BMI 20.30 kg/m  , BMI Body mass index is 20.3 kg/m. GEN: Well nourished, well developed, in no acute distress  HEENT: normal  Neck: no JVD, carotid bruits, or masses Cardiac: iRRR; no murmurs, rubs, or gallops,no edema  Respiratory:  clear to auscultation bilaterally, normal work of breathing GI: soft, nontender, nondistended, + BS MS: no deformity or atrophy  Skin: warm and dry Neuro:  Strength and sensation are intact Psych: euthymic mood, full affect  EKG:  EKG is not ordered today. Personal review of the ekg ordered 11/29/17 shows SR, PVCs, IVCD, anterior MI  Recent Labs: 07/09/2017: B Natriuretic Peptide 460.0 11/26/2017: ALT 13; BUN 19; Creatinine, Ser 1.03; Hemoglobin 15.0; Platelets 283.0; Potassium 4.5; Sodium 139    Lipid Panel     Component Value Date/Time   CHOL 155 11/26/2017 1204   TRIG 63.0 11/26/2017 1204   HDL 85.30 11/26/2017 1204   CHOLHDL 2 11/26/2017 1204   VLDL 12.6 11/26/2017 1204   LDLCALC 57 11/26/2017 1204   LDLDIRECT 50.0 11/25/2016 1131     Wt Readings from Last 3 Encounters:  12/23/17 125 lb 12.8 oz (57.1 kg)  12/09/17 125 lb 8 oz (56.9 kg)  11/26/17 124 lb 8 oz (56.5 kg)      Other studies Reviewed: Additional studies/ records that were reviewed today include: TTE 07/07/17  Review of the above records today demonstrates:  - Left ventricle: The cavity size was mildly dilated. Wall   thickness was normal. Systolic function was moderately to   severely reduced. The estimated ejection fraction was in the   range of 30% to 35%. Global hypokinesis with severe inferior wall   hypokinesis to akinesis. The study is not technically sufficient   to allow evaluation of LV diastolic function. - Aortic valve: Poorly visualized. There was no stenosis. Trace to   mild regurgitation. - Mitral valve: Mildly thickened leaflets . Dilated annulus with    posterior leaflet tethering. Mild to moderate regurgitation. - Left atrium: Moderately dilated. - Tricuspid valve: There was moderate regurgitation. - Pulmonary arteries: PA peak pressure: 40 mm Hg (S). - Inferior vena cava: The vessel was dilated. The respirophasic   diameter changes were blunted (< 50%), consistent with elevated   central venous pressure.  LHC 07/07/17  Dist  LAD lesion is 100% stenosed.  Ost 1st Diag lesion is 80% stenosed.  Previously placed Prox Cx to Mid Cx stent (unknown type) is widely patent.  RPDA lesion is 100% stenosed.  Prox LAD-1 lesion is 40% stenosed.  Previously placed Prox LAD-2 drug eluting stent is widely patent.  Balloon angioplasty was performed.  Post intervention, there is a 100% residual stenosis.  Holter 04/22/17 - personally reviewed The basic rhythm is sinus.  There are no significant bradyarrhythmias or pathologic pauses > 3 seconds There are frequent PVC's (22%), as well as short ventricular runs No atrial fibrillation or sustained tachyarrhythmias  ASSESSMENT AND PLAN:  1.  PVCs: Has not tolerated metoprolol.  Due to her high burden, this may be affecting her overall ejection fraction.  We Onica Davidovich plan to start her on low-dose mexiletine.  This can be increased depending on side effects and effectiveness.  2.  Coronary artery disease: Status post multiple MIs and PCI's in the past.  Most recent December 2018 with unsuccessful balloon angioplasty of the distal PDA.  Currently on aspirin, Plavix, nitrates, statin.  3.  Chronic combined systolic and diastolic heart failure: Has not tolerated ACE inhibitors or ARB's in the past.  Is currently on low-dose metoprolol.  Continue nitrates and Aldactone.  4.  Hypertension: Well-controlled.  No changes.    5.  COPD: Followed by pulmonary.  Currently on home oxygen    Current medicines are reviewed at length with the patient today.   The patient does not have concerns regarding her  medicines.  The following changes were made today: Start mexiletine  Labs/ tests ordered today include:  No orders of the defined types were placed in this encounter.  Case discussed with primary cardiology  Disposition:   FU with Keith Cancio 3 months  Signed, Araya Roel Meredith Leeds, MD  12/23/2017 9:58 AM     [facility] [address] [place] [address] [phone] (office) [phone] (fax)

## 2017-12-23 NOTE — Patient Instructions (Signed)
Medication Instructions:  Your physician has recommended you make the following change in your medication:  1. START Mexiletine 150 mg twice daily  Labwork: None ordered  Testing/Procedures: None ordered  Follow-Up: Your physician recommends that you schedule a follow-up appointment in: 3 months with Dr. Curt Bears.   * If you need a refill on your cardiac medications before your next appointment, please call your pharmacy.   *Please note that any paperwork needing to be filled out by the provider will need to be addressed at the front desk prior to seeing the provider. Please note that any FMLA, disability or other documents regarding health condition is subject to a $25.00 charge that must be received prior to completion of paperwork in the form of a money order or check.  Thank you for choosing [facility]!!   Trinidad Curet, RN [phone]  Any Other Special Instructions Will Be Listed Below (If Applicable).  Mexiletine capsules What is this medicine? MEXILETINE (mex [place] e teen) is an antiarrhythmic agent. This medicine is used to treat irregular heart rhythm and can slow rapid heartbeats. It can help your heart to return to and maintain a normal rhythm. Because of the side effects caused by this medicine, it is usually used for heartbeat problems that may be life-threatening. This medicine may be used for other purposes; ask your health care provider or pharmacist if you have questions. COMMON BRAND NAME(S): Mexitil What should I tell my health care provider before I take this medicine? They need to know if you have any of these conditions: -liver disease -other heart problems -previous heart attack -an unusual or allergic reaction to mexiletine, other medicines, foods, dyes, or preservatives -pregnant or trying to get pregnant -breast-feeding How should I use this medicine? Take this medicine by mouth with a glass of water. Follow the directions on the prescription  label. It is recommended that you take this medicine with food or an antacid. Take your doses at regular intervals. Do not take your medicine more often than directed. Do not stop taking except on the advice of your doctor or health care professional. Talk to your pediatrician regarding the use of this medicine in children. Special care may be needed. Overdosage: If you think you have taken too much of this medicine contact a poison control center or emergency room at once. NOTE: This medicine is only for you. Do not share this medicine with others. What if I miss a dose? If you miss a dose, take it as soon as you can. If it is almost time for your next dose, take only that dose. Do not take double or extra doses. What may interact with this medicine? Do not take this medicine with any of the following medications: -dofetilide This medicine may also interact with the following medications: -caffeine -cimetidine -medicines for depression, anxiety, or psychotic disturbances -medicines to control heart rhythm -phenobarbital -phenytoin -rifampin -theophylline This list may not describe all possible interactions. Give your health care provider a list of all the medicines, herbs, non-prescription drugs, or dietary supplements you use. Also tell them if you smoke, drink alcohol, or use illegal drugs. Some items may interact with your medicine. What should I watch for while using this medicine? Your condition will be monitored closely when you first begin therapy. Often, this drug is first started in a hospital or other monitored health care setting. Once you are on maintenance therapy, visit your doctor or health care professional for regular checks on your progress. Because  your condition and use of this medicine carry some risk, it is a good idea to carry an identification card, necklace or bracelet with details of your condition, medications, and doctor or health care professional. Dennis Bast may get  drowsy or dizzy. Do not drive, use machinery, or do anything that needs mental alertness until you know how this medicine affects you. Do not stand or sit up quickly, especially if you are an older patient. This reduces the risk of dizzy or fainting spells. Alcohol can make you more dizzy, increase flushing and rapid heartbeats. Avoid alcoholic drinks. What side effects may I notice from receiving this medicine? Side effects that you should report to your doctor or health care professional as soon as possible: -allergic reactions like skin rash, itching or hives, swelling of the face, lips, or tongue -breathing problems -chest pain, continued irregular heartbeats -redness, blistering, peeling or loosening of the skin, including inside the mouth -seizures -skin rash -trembling, shaking -unusual bleeding or bruising -unusually weak or tired Side effects that usually do not require medical attention (report to your doctor or health care professional if they continue or are bothersome): -blurred vision -difficulty walking -heartburn -nausea, vomiting -nervousness -numbness, or tingling in the fingers or toes This list may not describe all possible side effects. Call your doctor for medical advice about side effects. You may report side effects to FDA at 1-800-FDA-1088. Where should I keep my medicine? Keep out of reach of children. Store at room temperature between 15 and 30 degrees C (59 and 86 degrees F). Throw away any unused medicine after the expiration date. NOTE: This sheet is a summary. It may not cover all possible information. If you have questions about this medicine, talk to your doctor, pharmacist, or health care provider.  2018 Elsevier/Gold Standard (2008-01-30 13:59:49)

## 2017-12-27 ENCOUNTER — Telehealth: Payer: Self-pay | Admitting: Cardiology

## 2017-12-27 ENCOUNTER — Ambulatory Visit: Payer: PPO | Admitting: Cardiovascular Disease

## 2017-12-27 NOTE — Telephone Encounter (Signed)
New Message    Pt c/o Shortness Of Breath: STAT if SOB developed within the last 24 hours or pt is noticeably SOB on the phone  1. Are you currently SOB (can you hear that pt is SOB on the phone)?Patient says no, but to me she sounds a little short of breath  2. How long have you been experiencing SOB? About 3 days 3. Are you SOB when sitting or when up moving around? Mostly when she first gets up in the morning and she then uses a nebulizer and feels some what better than it comes back   4. Are you currently experiencing any other symptoms? Sharp pain after her nebulizer treatment

## 2017-12-27 NOTE — Telephone Encounter (Signed)
Patient calling and states that she has been SOB since Friday. She states that it is not associated with exertion. She states that it is mostly when she gets up first thing in the morning. She states that she has to use her nebulizer and that it usually gets better by the second treatment. She states that she gets a tightness in her chest sometimes when she uses her nebulizer. Patient is audibly SOB and is wheezing. She has a cough that is productive and states that it is brown in color. She states that she has been this way since she saw Dr. Curt Bears on 12/23/17. She denies any chest pain, LEE, weight gain, or any other symptoms. Patient taking diuretics as prescribed. She was started on mexiletine 150 mg BID for her high burden of PVCs on 12/23/17. Patient denies feeling any palpitations or irregular heartbeats. She states that her SBPs have been 94-128 and DBPs 65-78 with HRs 63-76. Patient wanting to know if her symptoms are related to the mexiletine. Made patient aware that it sounded more respiratory related and that she should touch base with Dr. Lamonte Sakai or her PCP. Made patient aware that I would forward to Dr. Curt Bears and his RN for review and that she would be called with any additional recommendations. Patient verbalized understanding and thanked me for the call.

## 2017-12-29 ENCOUNTER — Telehealth: Payer: Self-pay | Admitting: Emergency Medicine

## 2017-12-29 ENCOUNTER — Other Ambulatory Visit: Payer: Self-pay | Admitting: Cardiovascular Disease

## 2017-12-29 ENCOUNTER — Telehealth: Payer: Self-pay | Admitting: Cardiology

## 2017-12-29 NOTE — Telephone Encounter (Signed)
[name] patient's niece is returning the call to speak with the nurse. CB is [phone]

## 2017-12-29 NOTE — Telephone Encounter (Signed)
Spoke with the patient today. She saw cardiology last week, had her metoprolol changed (we had speculated that it was affecting her fatigue and functional capacity) to an alternative. Unfortunately she couldn't tolerate, felt that it contributed to some increased SOB. She was then asked to stop it. She is calling now with dyspnea, tachypnea. She is using her albuterol, but not frequently. She states that she is wearing her o2 as directed. She sounds tachypneic on the phone.   She will need to be seen with a CXR, could have multiple causes including effusion, cardiac influence, ? Stability of her nodular disease, COPD, etc.

## 2017-12-29 NOTE — Telephone Encounter (Signed)
Spoke with Dr. Lamonte Sakai. He spoke with the pt and he is not sure what's going on with the pt. He recommends that she needs an OV to be evaluated.  I called and spoke with the pt's niece, Almyra Free. She is aware of the above information. Pt has been scheduled with Aaron Edelman on 12/30/17 at 2pm. Nothing further was needed.

## 2017-12-29 NOTE — Telephone Encounter (Signed)
Spoke with the patients niece and she is reporting that the pt is complaining of feeling like a "rash" type irritation under her skin all over. No visible rash and not itching but she is also having worsening shortness of breath. She is audibly wheezing over the phone and having very labored breathing. I advised pt to call EMS but she declined. Spoke with Dr. Curt Bears and pt to stop the Mexiletine and to see pulmonary, Dr. Lamonte Sakai asap. Pt/neice [name] says she will call this morning to get in. Pt is also going to be called back by [facility] to get a sooner appt with Dr. Curt Bears per his request.

## 2017-12-29 NOTE — Telephone Encounter (Signed)
Called and spoke with pt who stated her breathing has become worse since Sunday, 6/2.  Pt states she is becoming SOB a lot quicker than usual and wants to know if there is anything that can be prescribed or what recommendations to help with the SOB.  Dr. Lamonte Sakai, please advise.

## 2017-12-29 NOTE — Telephone Encounter (Signed)
New message    Pt c/o medication issue:  1. Name of Medication:   mexiletine (MEXITIL) 150 MG capsule Take 1 capsule (150 mg total) by mouth 2 (two) times daily.        2. How are you currently taking this medication (dosage and times per day)? 2 times a day  3. Are you having a reaction (difficulty breathing--STAT)?  yes  4. What is your medication issue?  Sob , and rash

## 2017-12-30 ENCOUNTER — Encounter: Payer: Self-pay | Admitting: Pulmonary Disease

## 2017-12-30 ENCOUNTER — Other Ambulatory Visit: Payer: Self-pay | Admitting: Pulmonary Disease

## 2017-12-30 ENCOUNTER — Ambulatory Visit: Payer: PPO | Admitting: Pulmonary Disease

## 2017-12-30 ENCOUNTER — Ambulatory Visit (INDEPENDENT_AMBULATORY_CARE_PROVIDER_SITE_OTHER)
Admission: RE | Admit: 2017-12-30 | Discharge: 2017-12-30 | Disposition: A | Discharge status: Home / Self Care | Payer: PPO | Source: Ambulatory Visit | Attending: Pulmonary Disease | Admitting: Pulmonary Disease

## 2017-12-30 VITALS — BP 102/72 | HR 79 | Ht 68.0 in | Wt 122.4 lb

## 2017-12-30 DIAGNOSIS — R0600 Dyspnea, unspecified: Secondary | ICD-10-CM

## 2017-12-30 DIAGNOSIS — R058 Other specified cough: Secondary | ICD-10-CM

## 2017-12-30 DIAGNOSIS — J9611 Chronic respiratory failure with hypoxia: Secondary | ICD-10-CM

## 2017-12-30 DIAGNOSIS — R918 Other nonspecific abnormal finding of lung field: Secondary | ICD-10-CM

## 2017-12-30 DIAGNOSIS — J181 Lobar pneumonia, unspecified organism: Secondary | ICD-10-CM

## 2017-12-30 DIAGNOSIS — J301 Allergic rhinitis due to pollen: Secondary | ICD-10-CM | POA: Diagnosis not present

## 2017-12-30 DIAGNOSIS — R05 Cough: Secondary | ICD-10-CM | POA: Diagnosis not present

## 2017-12-30 MED ORDER — LEVOFLOXACIN 500 MG PO TABS
500.0000 mg | ORAL_TABLET | Freq: Every day | ORAL | 0 refills | Status: DC
Start: 2017-12-30 — End: 2018-01-20

## 2017-12-30 MED ORDER — BENZONATATE 200 MG PO CAPS: 200.0000 mg | ORAL_CAPSULE | Freq: Three times a day (TID) | ORAL | 1 refills | Status: DC | PRN

## 2017-12-30 MED ORDER — IPRATROPIUM-ALBUTEROL 0.5-2.5 (3) MG/3ML IN SOLN
3.0000 mL | Freq: Four times a day (QID) | RESPIRATORY_TRACT | Status: AC
  Administered 2017-12-30: 3 mL via RESPIRATORY_TRACT

## 2017-12-30 NOTE — Assessment & Plan Note (Signed)
Continue Flonase Can also do nasal saline rinses

## 2017-12-30 NOTE — Assessment & Plan Note (Signed)
Repeat PET scan in July 2019

## 2017-12-30 NOTE — Patient Instructions (Addendum)
Antibiotic >>> Levaquin 500 mg (1 tablet) daily for next 7 days >>> Take with food >>> Ensure that you are eating either yogurt, or starting a probiotic in order to ensure good Bacteria health  Continue to use nebulizers for shortness of breath and wheezing Continue rescue inhaler as needed for shortness of breath  For cough >>> Over-the-counter measures such as Delsym sample provided today >>> Tessalon Perles use as needed every 8 hours for cough  Follow-up with cardiology regarding your Mexitil dose and continuing to hold this.  Repeat PET scan in July/2019 >>>01/25/2018  8:30 AM at  [facility] PET CT     Please contact the office if your symptoms worsen or you have concerns that you are not improving.   Thank you for choosing [facility] for your healthcare, and for allowing [place] to partner with you on your healthcare journey. I am thankful to be able to provide care to you today.   Wyn Quaker FNP-C        Community-Acquired Pneumonia, Adult Pneumonia is an infection of the lungs. One type of pneumonia can happen while a person is in a hospital. A different type can happen when a person is not in a hospital (community-acquired pneumonia). It is easy for this kind to spread from person to person. It can spread to you if you breathe near an infected person who coughs or sneezes. Some symptoms include:  A dry cough.  A wet (productive) cough.  Fever.  Sweating.  Chest pain.  Follow these instructions at home:  Take over-the-counter and prescription medicines only as told by your doctor. ? Only take cough medicine if you are losing sleep. ? If you were prescribed an antibiotic medicine, take it as told by your doctor. Do not stop taking the antibiotic even if you start to feel better.  Sleep with your head and neck raised (elevated). You can do this by putting a few pillows under your head, or you can sleep in a recliner.  Do not use  tobacco products. These include cigarettes, chewing tobacco, and e-cigarettes. If you need help quitting, ask your doctor.  Drink enough water to keep your pee (urine) clear or pale yellow. A shot (vaccine) can help prevent pneumonia. Shots are often suggested for:  People older than 77 years of age.  People older than 77 years of age: ? Who are having cancer treatment. ? Who have long-term (chronic) lung disease. ? Who have problems with their body's defense system (immune system).  You may also prevent pneumonia if you take these actions:  Get the flu (influenza) shot every year.  Go to the dentist as often as told.  Wash your hands often. If soap and water are not available, use hand sanitizer.  Contact a doctor if:  You have a fever.  You lose sleep because your cough medicine does not help. Get help right away if:  You are short of breath and it gets worse.  You have more chest pain.  Your sickness gets worse. This is very serious if: ? You are an older adult. ? Your body's defense system is weak.  You cough up blood. This information is not intended to replace advice given to you by your health care provider. Make sure you discuss any questions you have with your health care provider. Document Released: 12/30/2007 Document Revised: 12/19/2015 Document Reviewed: 11/07/2014 Elsevier Interactive Patient Education  [employer].

## 2017-12-30 NOTE — Assessment & Plan Note (Signed)
Antibiotic >>> Levaquin 500 mg (1 tablet) daily for next 7 days >>> Take with food >>> Ensure that you are eating either yogurt, or starting a probiotic in order to ensure good Bacteria health  Continue to use nebulizers for shortness of breath and wheezing Continue rescue inhaler as needed for shortness of breath  Follow-up with cardiology regarding your Mexitil dose and continuing to hold this.  Repeat PET scan in July/2019 >>>01/25/2018  8:30 AM at  [facility] PET CT

## 2017-12-30 NOTE — Assessment & Plan Note (Signed)
Antibiotic >>> Levaquin 500 mg (1 tablet) daily for next 7 days >>> Take with food >>> Ensure that you are eating either yogurt, or starting a probiotic in order to ensure good Bacteria health  Continue to use nebulizers for shortness of breath and wheezing Continue rescue inhaler as needed for shortness of breath  Follow-up with cardiology regarding your Mexitil dose and continuing to hold this.  Repeat PET scan in July/2019 >>>01/25/2018  8:30 AM at  [facility] PET CT

## 2017-12-30 NOTE — Progress Notes (Signed)
@Patient  ID: [name], female    DOB: August 13, 1940, 77 y.o.   MRN: [id number]  Chief Complaint  Patient presents with  . 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 She has been having dyspnea for > 1 year, some cough that is productive of white mucous. She has hoarse voice frequently, not necessarily related to cough. Not clear that it relates in time to symbicort.    Recent [facility] Encounters:    ROV 06/10/17 --77 year old woman with a history of COPD and a right lower lobe superior segmental segmentectomy for adenocarcinoma of the lung.  She has pulmonary nodules that we have followed with serial CT scans, most recent showed an index nodule that had decreased in size so biopsy was deferred.  Her bronchodilators currently include DuoNeb 4x a day. She has had some nasal congestion, hoarse voice last 2-3 days. She has albuterol HFA, uses rarely.  She is on loratadine prn, uses flonase daily. Flu shot is up to date.   ROV 08/20/17 --this is a follow-up visit for 77 year old woman with COPD and history of adenocarcinoma of the lung status post right lower lobe superior segmental segmentectomy.  We have been following a right lower lobe and a left lower lobe pulmonary nodule with serial CT scans.  She also had a PET scan 12/22/16 that showed some mild hypermetabolism in these areas.  We had arranged for a needle biopsy of the left lower lobe nodule to be done 01/05/17 but the left lower lobe nodule was smaller and had decreasing nodularity so the procedure was deferred.  She returns now after a repeat scan done on 07/02/17 that I have reviewed.  Again this shows moderate emphysema and a spiculated right lower lobe nodule abutting the pleura, measures 2.8 x 1.3 cm which is slightly larger  than 11/30/16.  The spiculated left lower lobe nodule now measures 1.9 x 1.4 cm, stable from June but larger than studies dating back to 2017.  Both locations are suspicious.   She is having more dyspnea lately, seems to benefit from her albuterol/  We spent 40 minutes of a 50-minute appointment discussing her CT scans, reviewing results, discussing options regarding biopsy versus watchful waiting  ROV 11/23/17 --Mrs. Winthrop has a history of COPD, adenocarcinoma of the lung status post right lower lobe superior segmental segmentectomy, pulmonary nodules that we have followed with serial imaging.  As above her most recent imaging shows a slight increase in size of a right lower lobe pleural-based nodule, stable left lower lobe nodule (but enlarging compared with 2017). We were going to bx the LLL nodule but it had decreased in size so the bx was not done. Several call from her since last time, worsening sx of cough, fatigue, dyspnea. She was given prednisone, doxycycline. She has significant fatigue, weakness, ? Since her b-blocker was added back earlier this year. Her cough is a bit better. She is on loratadine and flonase during the warm months. Off prednisone now. She is on duoneb q4h on a schedule.  Plan: Continue scheduled duo nebs, will follow her with PET scan in July, continue Flonase and loratadine, emphasized and encouraged patient to use oxygen continuously and certainly at least during times of exertion (patient resistant to this as the patient "does not  want to be dependent on it")   Tests:   09/02/2012-pulmonary function test- ratio 71-->51, FEV1 65, no significant bronchodilator response, diminished diffusion capacity >>>this prompted pulmonary referral  Imaging:  07/07/2017-chest x-ray-vascular congestion and borderline cardiomegaly, increased interstitial markings raise concern for pulmonary edema 07/02/2017-CT chest without contrast- mild interval enlargement of spiculated pulmonary nodules  in both lower lobes compared to previous studies dating back to 2017 these are specific suspicious for low-grade adenocarcinoma consider PET CT or needle biopsy 12/22/2016-PET scan- gradual increase in size and FDG uptake of bilateral lower lobe pulmonary nodules compared to previous studies dating back in 2016, differential diagnosis includes low-grade bronchiogenic carcinomas and postinflammatory scarring  Cardiac:  07/07/2017-echocardiogram-LV ejection fraction 30 to 57%, systolic function was moderately to severely reduced,  Labs:   Micro:   Chart Review:  12/29/17 -multiple telephone encounters- patient reporting increased shortness of breath, dyspnea over past few days especially since recent changes to her cardiac medications, Dr. Lamonte Sakai encouraging patient to get scheduled for acute follow-up visit.  Acute visit scheduled for 12/30/2017    12/30/17  Acute  Pleasant 77 year old patient presenting today for worsening shortness of breath, productive cough, and increased fatigue.  Patient reports having chills but has been unable to check for fever.  Patient is afebrile today in office.  She does report an occasional wheeze.  She completed chest x-ray prior to appointment today due to symptoms.  Allergies  Allergen Reactions  . Prednisone     Shaking   . Actonel [Risedronate Sodium] Other (See Comments)    Headache  . Amlodipine Other (See Comments)    Pedal edema  . Brilinta [Ticagrelor] Other (See Comments)    Worsening dyspnea, malaise  . Codeine Rash  . Fosamax [Alendronate Sodium] Other (See Comments)    Unable to tolerate  . Lisinopril Cough  . Pravastatin Other (See Comments)    Constipation.    Immunization History  Administered Date(s) Administered  . Influenza Split 03/29/2012, 05/08/2013  . Influenza, High Dose Seasonal PF 04/06/2017  . Influenza,inj,Quad PF,6+ Mos 02/24/2014, 04/08/2015, 04/01/2016  . Pneumococcal Conjugate-13 05/08/2015  . Pneumococcal  Polysaccharide-23 07/27/2009  . Zoster 07/28/2011    Past Medical History:  Diagnosis Date  . Arthritis   . CAD (coronary artery disease) 03/2009   s/p MI  . Chronic combined systolic and diastolic heart failure ([place]) 06/17/2009   Qualifier: Diagnosis of  By: Burt Knack, MD, Clayburn Pert   . CKD (chronic kidney disease) stage 3, GFR 30-59 ml/min (HCC)   . Emphysema/COPD ([place])   . Ex-smoker quit 2008  . GERD (gastroesophageal reflux disease)   . HCAP (healthcare-associated pneumonia) 03/19/2016  . Helicobacter pylori gastritis 10/2007   treated  . History of CVA (cerebrovascular accident) without residual deficits 01/2012, 06/2012   R hemorrhagic MCA 01/2012 with remote lacunar infarct L putamen and IC, rpt 06/2012 acute multifocal R MCA infarct with remote hemorrhagic strokes affecting L basal ganglia and periventricular white matter, full recovery  . HLD (hyperlipidemia)   . HTN (hypertension)   . Ischemic cardiomyopathy 02/27/2014  . Lung cancer ([place]) dx'd 07/2006   Lung CA, s/p resection, followed by Dr Earlie Server  . Macular degeneration   . Myocardial infarction [facility]) 03/2009   Acute myocardial infarction 2010 - treated with BMS of LCx. LVEF 50% with subsequent CHF  . NSTEMI (non-ST elevated myocardial infarction) ([place])   . Osteoporosis 09/2013   T -3.6 forearm 09/2013, T -4.5 forearm 04/2015  . Stroke [facility])  Tobacco History: Social History   Tobacco Use  Smoking Status Former Smoker  . Packs/day: 0.50  . Years: 42.00  . Pack years: 21.00  . Types: Cigarettes  . Last attempt to quit: 07/27/2002  . Years since quitting: 15.4  Smokeless Tobacco Never Used   Counseling given: Not Answered Continue not smoking  Outpatient Encounter Medications as of 12/30/2017  Medication Sig  . albuterol (PROAIR HFA) 108 (90 Base) MCG/ACT inhaler Inhale 2 puffs into the lungs every 4 (four) hours as needed for wheezing or shortness of breath.  Marland Kitchen albuterol (PROVENTIL) (2.5 MG/3ML) 0.083% nebulizer  solution Take 3 mLs (2.5 mg total) by nebulization every 4 (four) hours as needed for wheezing or shortness of breath.  Marland Kitchen aspirin EC 81 MG tablet Take 1 tablet (81 mg total) by mouth daily.  . calcium carbonate (OS-CAL - DOSED IN MG OF ELEMENTAL CALCIUM) 1250 (500 Ca) MG tablet Take 1 tablet by mouth 2 (two) times daily with a meal.  . clopidogrel (PLAVIX) 75 MG tablet Take 1 tablet (75 mg total) by mouth daily.  Marland Kitchen denosumab (PROLIA) 60 MG/ML SOLN injection Inject 60 mg into the skin every 6 (six) months. Administer in upper arm, thigh, or abdomen  . fluticasone (FLONASE) 50 MCG/ACT nasal spray INSTILL 2 SPRAYS INTO BOTH NOSTRILS ONCEDAILY AS DIRECTED  . furosemide (LASIX) 20 MG tablet Take 1 tablet (20 mg total) by mouth daily. Take an additional 1/2 tablet daily for swelling.  Marland Kitchen ipratropium-albuterol (DUONEB) 0.5-2.5 (3) MG/3ML SOLN INHALE 1 VIAL VIA NEBULIZER FOUR TIMES ADAY AND EVERY 4 HOURS AS NEEDED AS DIRECTED  . isosorbide mononitrate (IMDUR) 30 MG 24 hr tablet TAKE 1 TABLET BY MOUTH DAILY  . loratadine (CLARITIN) 10 MG tablet Take 10 mg by mouth daily as needed for allergies or rhinitis.   . LOTEMAX 0.5 % ophthalmic suspension Place 1 drop into both eyes daily.   . nitroGLYCERIN (NITROSTAT) 0.4 MG SL tablet Place 1 tablet (0.4 mg total) under the tongue every 5 (five) minutes as needed for chest pain.  . pantoprazole (PROTONIX) 40 MG tablet TAKE 1 TABLET BY MOUTH ONCE DAILY  . prednisoLONE acetate (PRED FORTE) 1 % ophthalmic suspension 1 drop daily. Both eyes  . simvastatin (ZOCOR) 40 MG tablet TAKE 1 TABLET BY MOUTH AT BEDTIME  . Spacer/Aero-Holding Chambers (AEROCHAMBER MV) inhaler Use as instructed  . spironolactone (ALDACTONE) 25 MG tablet TAKE 1 TABLET BY MOUTH ONCE DAILY  . benzonatate (TESSALON) 200 MG capsule Take 1 capsule (200 mg total) by mouth 3 (three) times daily as needed for cough.  Marland Kitchen levofloxacin (LEVAQUIN) 500 MG tablet Take 1 tablet (500 mg total) by mouth daily.  Marland Kitchen  mexiletine (MEXITIL) 150 MG capsule Take 1 capsule (150 mg total) by mouth 2 (two) times daily. (Patient not taking: Reported on 12/30/2017)   Facility-Administered Encounter Medications as of 12/30/2017  Medication  . ipratropium-albuterol (DUONEB) 0.5-2.5 (3) MG/3ML nebulizer solution 3 mL     Review of Systems  Constitutional:  +fatigue and chills  No  weight loss, night sweats,  fevers HEENT:  +HA, nasal congestion,  No headaches,  Difficulty swallowing,  Tooth/dental problems, or  Sore throat, No sneezing, itching, ear ache, nasal congestion, post nasal drip  CV: +occasional palpitations No chest pain,  orthopnea, PND, swelling in lower extremities, anasarca, dizziness, palpitations, syncope  GI: No heartburn, indigestion, abdominal pain, nausea, vomiting, diarrhea, change in bowel habits, loss of appetite, bloody stools Resp: +sob at rest and exertion, cough,  productive with brown and yellow mucous, wheezing  No coughing up of blood.    No chest wall deformity Skin: no rash, lesions, no skin changes. GU: no dysuria, change in color of urine, no urgency or frequency.  No flank pain, no hematuria  MS:  No joint pain or swelling.  No decreased range of motion.  No back pain. Psych:  No change in mood or affect. No depression or anxiety.  No memory loss.   Physical Exam  BP 102/72 (BP Location: Left Arm, Cuff Size: Normal)   Pulse 79   Ht 5\' 8"  (1.727 m)   Wt 122 lb 6.4 oz (55.5 kg)   SpO2 94%   BMI 18.61 kg/m   Wt Readings from Last 3 Encounters:  12/30/17 122 lb 6.4 oz (55.5 kg)  12/23/17 125 lb 12.8 oz (57.1 kg)  12/09/17 125 lb 8 oz (56.9 kg)     GEN: A/Ox3; pleasant , NAD, chronically ill   HEENT:  /AT,  EACs-clear, TMs-wnl, small left effusion without infection, NOSE-clear, THROAT-clear, no lesions, no postnasal drip or exudate noted.   NECK:  Supple w/ fair ROM; no JVD; normal carotid impulses w/o bruits;  no lymphadenopathy.    RESP:  +dullness in right base, good  air movement in other lobes, slight expiratory wheeze no accessory muscle use, no dullness to percussion  CARD:  RRR, no m/r/g, no peripheral edema, pulses intact, no cyanosis or clubbing.  GI:   Soft & nt; nml bowel sounds; no organomegaly or masses detected.   Musco: Warm bil, no deformities or joint swelling noted.   Neuro: alert, no focal deficits noted.    Skin: Warm, no lesions or rashes   Breathing Treatment in office: Duo Neb  Resp: Improved air movement, no wheeze  Chest x-ray today revealing a potential right lower lobe pneumonia.  Lab Results:  CBC    Component Value Date/Time   WBC 7.3 11/26/2017 1204   RBC 4.69 11/26/2017 1204   HGB 15.0 11/26/2017 1204   HGB 12.9 04/30/2014 1311   HCT 44.8 11/26/2017 1204   HCT 39.3 04/30/2014 1311   PLT 283.0 11/26/2017 1204   PLT 213 04/30/2014 1311   MCV 95.4 11/26/2017 1204   MCV 95.5 04/30/2014 1311   MCH 31.7 07/08/2017 0350   MCHC 33.5 11/26/2017 1204   RDW 14.7 11/26/2017 1204   RDW 13.0 04/30/2014 1311   LYMPHSABS 1.9 11/26/2017 1204   LYMPHSABS 2.5 04/30/2014 1311   MONOABS 0.6 11/26/2017 1204   MONOABS 0.6 04/30/2014 1311   EOSABS 1.1 (H) 11/26/2017 1204   EOSABS 0.5 04/30/2014 1311   BASOSABS 0.1 11/26/2017 1204   BASOSABS 0.1 04/30/2014 1311    BMET    Component Value Date/Time   NA 139 11/26/2017 1204   NA 139 04/30/2014 1312   K 4.5 11/26/2017 1204   K 4.0 04/30/2014 1312   CL 101 11/26/2017 1204   CL 102 04/18/2012 1057   CO2 29 11/26/2017 1204   CO2 27 04/30/2014 1312   GLUCOSE 89 11/26/2017 1204   GLUCOSE 97 04/30/2014 1312   GLUCOSE 102 (H) 04/18/2012 1057   BUN 19 11/26/2017 1204   BUN 20.7 04/30/2014 1312   CREATININE 1.03 11/26/2017 1204   CREATININE 0.75 06/29/2016 1112   CREATININE 1.0 04/30/2014 1312   CALCIUM 10.3 11/26/2017 1204   CALCIUM 9.7 04/30/2014 1312   GFRNONAA 55 (L) 07/09/2017 0604   GFRAA >60 07/09/2017 0604    BNP  Component Value Date/Time   BNP 460.0  (H) 07/09/2017 0604   BNP 368.4 (H) 06/22/2016 1154    ProBNP    Component Value Date/Time   PROBNP 271.0 (H) 03/29/2013 1630    Imaging: Dg Chest 2 View  Result Date: 12/30/2017 CLINICAL DATA:  Cough, sob, congestion, x 5 days, HTN, x-smoker, hx of emphysema, lung ca. EXAM: CHEST - 2 VIEW COMPARISON:  07/07/2017 FINDINGS: Lungs are hyperinflated. There is mild perihilar peribronchial thickening. More focal faint opacity is identified in the RIGHT LOWER lobe and is consistent with early infiltrate. No pleural effusions or pulmonary edema. IMPRESSION: 1. Hyperinflation and bronchitic changes. 2. Suspect developing infiltrate in the RIGHT LOWER lobe. Electronically Signed   By: Nolon Nations M.D.   On: 12/30/2017 13:56     Assessment & Plan:   Pleasant 77 year old patient patient presenting today for continued fatigue, shortness of breath, productive cough.  Patient was unsure if this was a respiratory issue or if this was due to her recent Mexiletine start with cards.  She followed up with cardiology who told her to hold her medication.  She is currently not on an antiarrhythmic.  Patient follow-up with cardiology regarding recent diagnosis today  Stat chest x-ray today reveals a potential right lower lobe pneumonia given her clinical presentation and symptoms as well as chest x-ray findings we will treat with Levaquin 500 mg for 7 days.  Patient had recently completed 2 rounds of doxycycline less than 3 months ago.  DuoNeb in office today.  Patient return to office in 4 weeks for repeat chest x-ray and follow-up.  Pulmonary nodules Repeat PET scan in July 2019  Chronic respiratory failure with hypoxia (HCC) Antibiotic >>> Levaquin 500 mg (1 tablet) daily for next 7 days >>> Take with food >>> Ensure that you are eating either yogurt, or starting a probiotic in order to ensure good Bacteria health  Continue to use nebulizers for shortness of breath and wheezing Continue rescue  inhaler as needed for shortness of breath  Follow-up with cardiology regarding your Mexitil dose and continuing to hold this.  Repeat PET scan in July/2019 >>>01/25/2018  8:30 AM at  [facility] PET CT   Lobar pneumonia, unspecified organism ([place]) Antibiotic >>> Levaquin 500 mg (1 tablet) daily for next 7 days >>> Take with food >>> Ensure that you are eating either yogurt, or starting a probiotic in order to ensure good Bacteria health  Continue to use nebulizers for shortness of breath and wheezing Continue rescue inhaler as needed for shortness of breath  Follow-up with cardiology regarding your Mexitil dose and continuing to hold this.  Repeat PET scan in July/2019 >>>01/25/2018  8:30 AM at  [facility] PET CT   Allergic rhinitis Continue Flonase Can also do nasal saline rinses     Lauraine Rinne, NP 12/30/2017

## 2018-01-06 NOTE — Telephone Encounter (Signed)
Verification of benefits have been processed and an approval has been received for pts prolia injection. Pts estimated cost are appx $250. This is only an estimate and cannot be confirmed until benefits are paid. Please advise pt and schedule if needed. If scheduled, once the injection is received, pls contact me back with the date it was received so that I am able to update prolia folder. thanks   Spoke to pt and advised. Ca lab and prolia injection scheduled

## 2018-01-07 ENCOUNTER — Other Ambulatory Visit (INDEPENDENT_AMBULATORY_CARE_PROVIDER_SITE_OTHER): Payer: PPO

## 2018-01-07 ENCOUNTER — Telehealth: Payer: Self-pay | Admitting: Emergency Medicine

## 2018-01-07 DIAGNOSIS — M810 Age-related osteoporosis without current pathological fracture: Secondary | ICD-10-CM | POA: Diagnosis not present

## 2018-01-07 LAB — CALCIUM: Calcium: 9.5 mg/dL (ref 8.4–10.5)

## 2018-01-07 NOTE — Telephone Encounter (Signed)
Spoke with pt. States that she is not feeling any better since seeing Aaron Edelman on 12/30/17. Reports shortness of breath, coughing and wheezing. Cough is producing brown mucus. Denies chest tightness or fever. Has finished Levaquin that was prescribed. Stated, "I just want to feel better." She is still not wearing her oxygen like she should.  Dr. Lamonte Sakai - please advise. Thanks.

## 2018-01-07 NOTE — Telephone Encounter (Signed)
Spoke with Dr. Lamonte Sakai over the phone and he stated that we have done all we could do for her and if she is getting worse than she should go to the ER. I called pt to let her know and she stated she understood and she will go to the ER if she gets worse. She stated she would call Monday if she feels she needs to come in. Nothing further is needed.

## 2018-01-10 ENCOUNTER — Encounter: Payer: Self-pay | Admitting: Cardiology

## 2018-01-10 NOTE — Telephone Encounter (Signed)
Left message to call back  

## 2018-01-10 NOTE — Telephone Encounter (Signed)
Patient's niece Almyra Free (DPR on file) calling back and asking if patient needs to keep appointment with Dr. Curt Bears tomorrow afternoon since she is not taking mexilitine anymore. Made niece aware that I will forward the message to Dr. Curt Bears RN to look at in the morning. Made her aware that his RN will call and advise in the morning. She verbalized understanding.

## 2018-01-10 NOTE — Telephone Encounter (Signed)
Pt's niece is calling and wanting to know if the appt for tomorrow is needed since the pt isn't taking mexiletine anymore. Please advise.

## 2018-01-11 ENCOUNTER — Ambulatory Visit: Payer: PPO | Admitting: Cardiology

## 2018-01-11 NOTE — Telephone Encounter (Addendum)
Informed niece only other option is Amiodarone which patient is not interested in d/t SE. Pt will continue to monitor and call if PVCs become problematic/symptomatic.  She will keep her f/u in November with Burt Knack and f/u w/ Dr. Curt Bears beginning of next year. Today's appt cancelled. Recall placed in Epic. Niece verbalized understanding and agreeable to plan.

## 2018-01-12 ENCOUNTER — Telehealth: Payer: Self-pay | Admitting: Family Medicine

## 2018-01-12 ENCOUNTER — Ambulatory Visit (INDEPENDENT_AMBULATORY_CARE_PROVIDER_SITE_OTHER): Payer: PPO

## 2018-01-12 DIAGNOSIS — M810 Age-related osteoporosis without current pathological fracture: Secondary | ICD-10-CM

## 2018-01-12 MED ORDER — DENOSUMAB 60 MG/ML ~~LOC~~ SOSY
60.0000 mg | PREFILLED_SYRINGE | Freq: Once | SUBCUTANEOUS | Status: AC
  Administered 2018-01-12: 60 mg via SUBCUTANEOUS

## 2018-01-12 NOTE — Telephone Encounter (Signed)
Spoke with pt relaying message per Dr. Darnell Level.  Pt states she does have the requested documents and will get them to Dr. Darnell Level sometime this week.

## 2018-01-12 NOTE — Progress Notes (Signed)
Per orders of Dr. Danise Mina, injection of Prolia 60 mg given by Ozzie Hoyle. Patient tolerated injection well.

## 2018-01-12 NOTE — Telephone Encounter (Signed)
[name] just a note pt had prolia injection 01/12/18.

## 2018-01-12 NOTE — Telephone Encounter (Signed)
plz call patient: We received last will and testament as well as legal power of attorney documents for patient - but I am more interested in medical documents - does she have living will/advanced directive and health care power of attorney available at home? If not we can mail her a form for these. If done through attorney, they usually include these, but I didn't see them included in documents she brought.

## 2018-01-17 ENCOUNTER — Encounter: Payer: Self-pay | Admitting: Cardiovascular Disease

## 2018-01-18 ENCOUNTER — Encounter: Payer: Self-pay | Admitting: Cardiovascular Disease

## 2018-01-18 NOTE — Telephone Encounter (Signed)
[name] would like recommendations from Dr. Burt Knack as she could not tolerate any medications Dr. Curt Bears initiated. She feels Dr. Burt Knack knows her well and could give good recommendations.  Offered her an appointment this Thursday, but the patient cannot get a ride. She requests recommendations over the phone.

## 2018-01-18 NOTE — Telephone Encounter (Signed)
This encounter was created in error - please disregard.

## 2018-01-18 NOTE — Telephone Encounter (Signed)
New message    Patient calling to request response from email

## 2018-01-18 NOTE — Telephone Encounter (Signed)
Left message to call back  

## 2018-01-19 ENCOUNTER — Telehealth: Payer: Self-pay | Admitting: Emergency Medicine

## 2018-01-19 ENCOUNTER — Telehealth: Payer: Self-pay | Admitting: Cardiovascular Disease

## 2018-01-19 DIAGNOSIS — J449 Chronic obstructive pulmonary disease, unspecified: Secondary | ICD-10-CM | POA: Diagnosis not present

## 2018-01-19 DIAGNOSIS — R269 Unspecified abnormalities of gait and mobility: Secondary | ICD-10-CM | POA: Diagnosis not present

## 2018-01-19 DIAGNOSIS — E785 Hyperlipidemia, unspecified: Secondary | ICD-10-CM | POA: Diagnosis not present

## 2018-01-19 DIAGNOSIS — R911 Solitary pulmonary nodule: Secondary | ICD-10-CM | POA: Diagnosis not present

## 2018-01-19 NOTE — Telephone Encounter (Signed)
Pt's niece-Julie called again seeing if we have heard back from [place]. Advised pt's niece, that RB is in clinic this afternoon, and will get back to her today. States that pt is not feeling better since her last OV. Reports coughing, chest tightness, wheezing and SOB. Cough is producing brown mucus. Denies fever/chills/sweats. Pt just finished a dose of Levaquin. Almyra Free would like Dr. Agustina Caroli recommendations.  Dr. Lamonte Sakai - please advise. Thanks

## 2018-01-19 NOTE — Telephone Encounter (Signed)
Pt has been scheduled for acute visit with MW on 01/20/18 at 11:30. Pt's niece, Almyra Free is aware of apt time/date.  Almyra Free stated she will attempt to get pt here for OV, as it is very difficult with pt's breathing.   Will route to RB as a FYI.

## 2018-01-19 NOTE — Telephone Encounter (Signed)
New Message   Pt's niece states she left a message for Dr. Burt Knack about a visit with a physician  that they were not pleased about and would like to know what other options they had, she states she wants to be the one to speak with the nurse(only Valetta Fuller) so Please call [name], the niece

## 2018-01-19 NOTE — Telephone Encounter (Signed)
Left patient's niece a message to call back.

## 2018-01-19 NOTE — Telephone Encounter (Signed)
Spoke with pt's niece, Almyra Free. States that pt is not feeling better since her last OV. Reports coughing, chest tightness, wheezing and SOB. Cough is producing brown mucus. Denies fever/chills/sweats. Pt just finished a dose of Levaquin. Almyra Free would like Dr. Agustina Caroli recommendations.  Dr. Lamonte Sakai - please advise. Thanks.

## 2018-01-19 NOTE — Telephone Encounter (Signed)
With her lung disease some of the other anti-arrhythmic drugs are contraindicated. I don't know of other options for her. Will touch base with Dr Curt Bears and see if any other thoughts. thx

## 2018-01-19 NOTE — Telephone Encounter (Signed)
She needs to be seen. Set her up with an OV

## 2018-01-19 NOTE — Telephone Encounter (Addendum)
Attempted to call pt's niece, Almyra Free. I did not receive an answer. I have left a message for Almyra Free to return our call.

## 2018-01-20 ENCOUNTER — Encounter: Payer: Self-pay | Admitting: Internal Medicine

## 2018-01-20 ENCOUNTER — Ambulatory Visit (INDEPENDENT_AMBULATORY_CARE_PROVIDER_SITE_OTHER)
Admission: RE | Admit: 2018-01-20 | Discharge: 2018-01-20 | Disposition: A | Discharge status: Home / Self Care | Payer: PPO | Source: Ambulatory Visit | Attending: Internal Medicine | Admitting: Internal Medicine

## 2018-01-20 ENCOUNTER — Ambulatory Visit: Payer: PPO | Admitting: Internal Medicine

## 2018-01-20 VITALS — BP 126/78 | HR 72 | Ht 68.0 in | Wt 126.0 lb

## 2018-01-20 DIAGNOSIS — J9611 Chronic respiratory failure with hypoxia: Secondary | ICD-10-CM

## 2018-01-20 DIAGNOSIS — J441 Chronic obstructive pulmonary disease with (acute) exacerbation: Secondary | ICD-10-CM

## 2018-01-20 DIAGNOSIS — J181 Lobar pneumonia, unspecified organism: Secondary | ICD-10-CM

## 2018-01-20 DIAGNOSIS — R05 Cough: Secondary | ICD-10-CM | POA: Diagnosis not present

## 2018-01-20 MED ORDER — METHYLPREDNISOLONE ACETATE 80 MG/ML IJ SUSP
120.0000 mg | Freq: Once | INTRAMUSCULAR | Status: AC
  Administered 2018-01-20: 120 mg via INTRAMUSCULAR

## 2018-01-20 MED ORDER — AMOXICILLIN-POT CLAVULANATE 875-125 MG PO TABS: 1.0000 | ORAL_TABLET | Freq: Two times a day (BID) | ORAL | 0 refills | Status: AC

## 2018-01-20 NOTE — Patient Instructions (Addendum)
Augmentin 875 mg take one pill twice daily  X 10 days - take at breakfast and supper with large glass of water.  It would help reduce the usual side effects (diarrhea and yeast infections) if you ate cultured yogurt at lunch.   For cough > mucinex dm 1200 mg every 12 hours as needed   Depomedrol 120 mg IM today   As long as coughing >  Increase the protonix to Take 30- 60 min before your first and last meals of the day   GERD (REFLUX)  is an extremely common cause of respiratory symptoms just like yours , many times with no obvious heartburn at all.    It can be treated with medication, but also with lifestyle changes including elevation of the head of your bed (ideally with 6 inch  bed blocks),  Smoking cessation, avoidance of late meals, excessive alcohol, and avoid fatty foods, chocolate, peppermint, colas, red wine, and acidic juices such as orange juice.  NO MINT OR MENTHOL PRODUCTS SO NO COUGH DROPS   USE SUGARLESS CANDY INSTEAD (Jolley ranchers or Stover's or Life Savers) or even ice chips will also do - the key is to swallow to prevent all throat clearing. NO OIL BASED VITAMINS - use powdered substitutes.    Please see patient coordinator before you leave today  to schedule diagnostic esophagram   Please remember to go to the  x-ray department downstairs in the basement  for your tests - we will call you with the results when they are available.      Keep your follow up appt with Dr Lamonte Sakai - call sooner if needed

## 2018-01-20 NOTE — Telephone Encounter (Signed)
Spoke to patient's niece and informed her of Dr Antionette Char response.  She was pleased to hear from [place] and I told her that I will pass this along to [facility], because that is the only nurse that she wants to speak with.  They are requesting your opinion or referral for a different EP provider.

## 2018-01-20 NOTE — Progress Notes (Signed)
[name], female    DOB: 1941-03-13, 77 y.o.   MRN: [id number]    12/29/17 -multiple telephone encounters- patient reporting increased shortness of breath, dyspnea over past few days especially since recent changes to her cardiac medications, Dr. Lamonte Sakai encouraging patient to get scheduled for acute follow-up visit.  Acute visit scheduled for 12/30/2017    12/30/17  NP  Acute  Pleasant 77 year old patient presenting today for worsening shortness of breath, productive cough, and increased fatigue.  Patient reports having chills but has been unable to check for fever.  Patient is afebrile today in office.  She does report an occasional wheeze rec Antibiotic >>> Levaquin 500 mg (1 tablet) daily for next 7 days >>> Take with food >>> Ensure that you are eating either yogurt, or starting a probiotic in order to ensure good Bacteria health Continue to use nebulizers for shortness of breath and wheezing Continue rescue inhaler as needed for shortness of breath For cough >>> Over-the-counter measures such as Delsym     01/20/2018  Acute extended  ov/[name] re: aecopd quit smoking 2004 with ? RLL pna Chief Complaint  Patient presents with  . Acute Visit    increased cough and wheezing x 2 wks. Cough is prod with brown sputum. She has not used rescue inhaler but uses neb with Duoneb 4 x per day. She has plain albuterol neb but has not been using this.   quit smoking 2004 / followed by Dr Grace Blight for copd / 02 at bedtime 2lpm and prn daytime  Baseline using neb qid with MMRC3 = can't walk 100 yards even at a slow pace at a flat grade s stopping due to sob   Abruptly much Worse since end of May 2019 ? Due to mexiline started May 30th w/in a few days breathing worse and some better since stopped it   Dyspnea:  50 ft sob assoc with worse nasal congestion  Cough: sporadic / variably brown  Mucus no change on abx / no change on delsym  Sleep: in recliner 90 degrees x 2 years  SABA use: helps   Past  Medical History:  Diagnosis Date  . Arthritis   . CAD (coronary artery disease) 03/2009   s/p MI  . Chronic combined systolic and diastolic heart failure ([place]) 06/17/2009   Qualifier: Diagnosis of  By: Burt Knack, MD, Clayburn Pert   . CKD (chronic kidney disease) stage 3, GFR 30-59 ml/min (HCC)   . Emphysema/COPD ([place])   . Ex-smoker quit 2008  . GERD (gastroesophageal reflux disease)   . HCAP (healthcare-associated pneumonia) 03/19/2016  . Helicobacter pylori gastritis 10/2007   treated  . History of CVA (cerebrovascular accident) without residual deficits 01/2012, 06/2012   R hemorrhagic MCA 01/2012 with remote lacunar infarct L putamen and IC, rpt 06/2012 acute multifocal R MCA infarct with remote hemorrhagic strokes affecting L basal ganglia and periventricular white matter, full recovery  . HLD (hyperlipidemia)   . HTN (hypertension)   . Ischemic cardiomyopathy 02/27/2014  . Lung cancer ([place]) dx'd 07/2006   Lung CA, s/p resection, followed by Dr Earlie Server  . Macular degeneration   . Myocardial infarction [facility]) 03/2009   Acute myocardial infarction 2010 - treated with BMS of LCx. LVEF 50% with subsequent CHF  . NSTEMI (non-ST elevated myocardial infarction) ([place])   . Osteoporosis 09/2013   T -3.6 forearm 09/2013, T -4.5 forearm 04/2015  . Stroke [facility])     Outpatient Medications Prior to Visit  Medication Sig Dispense  Refill  . albuterol (PROAIR HFA) 108 (90 Base) MCG/ACT inhaler Inhale 2 puffs into the lungs every 4 (four) hours as needed for wheezing or shortness of breath. 1 Inhaler 5  . albuterol (PROVENTIL) (2.5 MG/3ML) 0.083% nebulizer solution Take 3 mLs (2.5 mg total) by nebulization every 4 (four) hours as needed for wheezing or shortness of breath. 150 mL 5  . aspirin EC 81 MG tablet Take 1 tablet (81 mg total) by mouth daily.    . benzonatate (TESSALON) 200 MG capsule Take 1 capsule (200 mg total) by mouth 3 (three) times daily as needed for cough. 30 capsule 1  . calcium carbonate  (OS-CAL - DOSED IN MG OF ELEMENTAL CALCIUM) 1250 (500 Ca) MG tablet Take 1 tablet by mouth 2 (two) times daily with a meal.    . clopidogrel (PLAVIX) 75 MG tablet Take 1 tablet (75 mg total) by mouth daily. 30 tablet 11  . denosumab (PROLIA) 60 MG/ML SOLN injection Inject 60 mg into the skin every 6 (six) months. Administer in upper arm, thigh, or abdomen 60 mL 1  . fluticasone (FLONASE) 50 MCG/ACT nasal spray INSTILL 2 SPRAYS INTO BOTH NOSTRILS ONCEDAILY AS DIRECTED 16 g 5  . furosemide (LASIX) 20 MG tablet Take 1 tablet (20 mg total) by mouth daily. Take an additional 1/2 tablet daily for swelling. 40 tablet 11  . ipratropium-albuterol (DUONEB) 0.5-2.5 (3) MG/3ML SOLN INHALE 1 VIAL VIA NEBULIZER FOUR TIMES ADAY AND EVERY 4 HOURS AS NEEDED AS DIRECTED 360 mL 5  . isosorbide mononitrate (IMDUR) 30 MG 24 hr tablet TAKE 1 TABLET BY MOUTH DAILY 30 tablet 10  . loratadine (CLARITIN) 10 MG tablet Take 10 mg by mouth daily as needed for allergies or rhinitis.     . LOTEMAX 0.5 % ophthalmic suspension Place 1 drop into both eyes daily.     . nitroGLYCERIN (NITROSTAT) 0.4 MG SL tablet Place 1 tablet (0.4 mg total) under the tongue every 5 (five) minutes as needed for chest pain. 25 tablet 3  . pantoprazole (PROTONIX) 40 MG tablet TAKE 1 TABLET BY MOUTH ONCE DAILY 30 tablet 11  . prednisoLONE acetate (PRED FORTE) 1 % ophthalmic suspension 1 drop daily. Both eyes    . simvastatin (ZOCOR) 40 MG tablet TAKE 1 TABLET BY MOUTH AT BEDTIME 30 tablet 11  . Spacer/Aero-Holding Chambers (AEROCHAMBER MV) inhaler Use as instructed 1 each 0  . spironolactone (ALDACTONE) 25 MG tablet TAKE 1 TABLET BY MOUTH ONCE DAILY 90 tablet 2  . levofloxacin (LEVAQUIN) 500 MG tablet Take 1 tablet (500 mg total) by mouth daily. 7 tablet 0   Facility-Administered Medications Prior to Visit  Medication Dose Route Frequency Provider Last Rate Last Dose  . ipratropium-albuterol (DUONEB) 0.5-2.5 (3) MG/3ML nebulizer solution 3 mL  3 mL  Nebulization Q6H Lauraine Rinne, NP   3 mL at 12/30/17 1439           Objective:     BP 126/78 (BP Location: Left Arm, Cuff Size: Normal)   Pulse 72   Ht 5\' 8"  (1.727 m)   Wt 126 lb (57.2 kg)   SpO2 97%   BMI 19.16 kg/m   SpO2: 97 %   RA     Wt Readings from Last 3 Encounters:  01/20/18 126 lb (57.2 kg)  12/30/17 122 lb 6.4 oz (55.5 kg)  12/23/17 125 lb 12.8 oz (57.1 kg)        HEENT: nl   oropharynx. Nl external ear canals  without cough reflex - moderate bilateral non-specific turbinate edema  / top denture/ bottom partial    NECK :  without JVD/Nodes/TM/ nl carotid upstrokes bilaterally   LUNGS: no acc muscle use,  Mod barrel  contour chest wall with bilateral  Distant bs s audible wheeze and  without cough on insp or exp maneuver and mod   Hyperresonant  to  percussion bilaterally     CV:  RRR  no s3 or murmur or increase in P2, and no edema   ABD:  soft and nontender with pos mid insp Hoover's  in the supine position. No bruits or organomegaly appreciated, bowel sounds nl  MS:   Nl gait/  ext warm without deformities, calf tenderness, cyanosis or clubbing No obvious joint restrictions   SKIN: warm and dry without lesions    NEURO:  alert, approp, nl sensorium with  no motor or cerebellar deficits apparent.      CXR PA and Lateral:   01/20/2018 :    I personally reviewed images and agree with radiology impression as follows:   1. No acute cardiopulmonary disease. The area of possible pneumonia noted at the right lung base on the prior study corresponds to a posterior pleural based right lower lobe nodule noted on a previous CT. No convincing superimposed pneumonia. 2. COPD.    Assessment   No problem-specific Assessment & Plan notes found for this encounter.     Christinia Gully, MD 01/20/2018

## 2018-01-21 ENCOUNTER — Encounter: Payer: Self-pay | Admitting: Internal Medicine

## 2018-01-21 NOTE — Assessment & Plan Note (Signed)
cxr reviewed, no evidence pna, no f/u needed

## 2018-01-21 NOTE — Assessment & Plan Note (Addendum)
PFT's  09/02/12   FEV1 1.53 (68 % ) ratio 51  p 4 % improvement from saba p ? prior to study with DLCO  51 % corrects to 65 % for alv volume - 01/20/2018  After extensive coaching inhaler device  effectiveness =    75%   DDX of  difficult airways management almost all start with A and  include Adherence, Ace Inhibitors, Acid Reflux, Active Sinus Disease, Alpha 1 Antitripsin deficiency, Anxiety masquerading as Airways dz,  ABPA,  Allergy(esp in young), Aspiration (esp in elderly), Adverse effects of meds,  Active smokers, A bunch of PE's (a small clot burden can't cause this syndrome unless there is already severe underlying pulm or vascular dz with poor reserve) plus two Bs  = Bronchiectasis and Beta blocker use..and one C= CHF   Adherence is always the initial "prime suspect" and is a multilayered concern that requires a "trust but verify" approach in every patient - starting with knowing how to use medications, especially inhalers, correctly, keeping up with refills and understanding the fundamental difference between maintenance and prns vs those medications only taken for a very short course and then stopped and not refilled.   ? Acid (or non-acid) GERD > always difficult to exclude as up to 75% of pts in some series report no assoc GI/ Heartburn symptoms> rec max (24h)  acid suppression and diet restrictions/ reviewed and instructions given in writing.  - Of the three most common causes of  Sub-acute / recurrent or chronic cough, only one (GERD)  can actually contribute to/ trigger  the other two (asthma and post nasal drip syndrome)  and perpetuate the cylce of cough.  While not intuitively obvious, many patients with chronic low grade reflux do not cough until there is a primary insult that disturbs the protective epithelial barrier and exposes sensitive nerve endings.   This is typically viral but can due to PNDS and  either may apply here.   The point is that once this occurs, it is difficult to  eliminate the cycle  using anything but a maximally effective acid suppression regimen at least in the short run, accompanied by an appropriate diet to address non acid GERD and control / eliminate the cough itself for at least 3 days.    ? Aspiration > she does report dysphagia > Dg Es ordered   ? Adverse drug effects > doubt as only took a few doses of Mexilitene and long out of her system now with  Persistent symptoms   ? Active sinus dz > Augmentin 875 mg take one pill twice daily  X 10 days    ? Allergy/asthma component > can't take pred so try depomedrol 120 mg IM    I had an extended discussion with the patient reviewing all relevant studies completed to date and  lasting 25 minutes of a 40  minute acute office visit with pt new to me     re  severe non-specific but potentially very serious refractory respiratory symptoms of uncertain and potentially multiple  etiologies.  See device teaching which extended face to face time for this visit  Each maintenance medication was reviewed in detail including most importantly the difference between maintenance and prns and under what circumstances the prns are to be triggered using an action plan format that is not reflected in the computer generated alphabetically organized AVS.    Please see AVS for specific instructions unique to this office visit that I personally wrote and  verbalized to the the pt in detail and then reviewed with pt  by my nurse highlighting any changes in therapy/plan of care  recommended at today's visit.

## 2018-01-21 NOTE — Assessment & Plan Note (Signed)
Adequate control on present rx, reviewed in detail with pt > no change in rx needed   

## 2018-01-24 ENCOUNTER — Telehealth: Payer: Self-pay | Admitting: Emergency Medicine

## 2018-01-24 NOTE — Telephone Encounter (Signed)
Spoke with Almyra Free and advised her of the results as well. Nothing further Is needed.    Notes recorded by Riley Kill, CMA on 01/24/2018 at 10:20 AM EDT Called and spoke with patient, she is aware of results and verbalized understanding. ------  Notes recorded by Tanda Rockers, MD on 01/20/2018 at 7:50 PM EDT Call pt: Reviewed cxr and no acute change so no change in recommendations made at [facility]

## 2018-01-25 ENCOUNTER — Ambulatory Visit
Admission: RE | Admit: 2018-01-25 | Discharge: 2018-01-25 | Disposition: A | Discharge status: Home / Self Care | Payer: PPO | Source: Ambulatory Visit | Attending: Emergency Medicine | Admitting: Emergency Medicine

## 2018-01-25 DIAGNOSIS — K5730 Diverticulosis of large intestine without perforation or abscess without bleeding: Secondary | ICD-10-CM | POA: Diagnosis not present

## 2018-01-25 DIAGNOSIS — R911 Solitary pulmonary nodule: Secondary | ICD-10-CM | POA: Diagnosis not present

## 2018-01-25 DIAGNOSIS — J432 Centrilobular emphysema: Secondary | ICD-10-CM | POA: Insufficient documentation

## 2018-01-25 DIAGNOSIS — K5900 Constipation, unspecified: Secondary | ICD-10-CM | POA: Diagnosis not present

## 2018-01-25 DIAGNOSIS — I2510 Atherosclerotic heart disease of native coronary artery without angina pectoris: Secondary | ICD-10-CM | POA: Diagnosis not present

## 2018-01-25 DIAGNOSIS — R918 Other nonspecific abnormal finding of lung field: Secondary | ICD-10-CM | POA: Diagnosis not present

## 2018-01-25 DIAGNOSIS — I700 Atherosclerosis of aorta: Secondary | ICD-10-CM | POA: Diagnosis not present

## 2018-01-25 LAB — GLUCOSE, CAPILLARY: Glucose-Capillary: 79 mg/dL (ref 70–99)

## 2018-01-25 MED ORDER — FLUDEOXYGLUCOSE F - 18 (FDG) INJECTION
6.4000 | Freq: Once | INTRAVENOUS | Status: AC | PRN
  Administered 2018-01-25: 6.4 via INTRAVENOUS

## 2018-01-25 NOTE — Telephone Encounter (Signed)
Ms. Lal' niece reports she would like further recommendations from Dr. Burt Knack about treatment for PVCs.  She is unhappy they have tried drugs recommended by Dr. Curt Bears and none were tolerated.  Reiterated to her that because of Ms. Howson' lung disease, many of the other drugs are contraindicated, but Dr. Burt Knack will speak with EP to see possible suggestions.  She requests any and all suggestions, including procedures, medications, and dietary. Reiterated to her that Ms. Liebig' PVC burden is high and will most likely not decrease with dietary changes, but encouraged her to have the patient try to limit all caffeine.   She requests a call back with Dr. Antionette Char recommendations.

## 2018-01-26 ENCOUNTER — Encounter: Payer: Self-pay | Admitting: Emergency Medicine

## 2018-01-26 NOTE — Telephone Encounter (Signed)
Informed patient that Dr. Burt Knack spoke with EP and the only other medication option is amiodarone, but the risk of worsening pulmonary issues is quite significant. She adamantly refuses to try any other medication as she "felt about to die." Informed her that there are no specific lifestyle modifications she can make to decrease PVC amount other than totally avoiding caffeine.  She will attempt to limit caffeine and tolerate PVCs as there are no medication options for her. She was grateful for call and agrees with treatment plan.   Attempted to call her niece to update. Left message for her to speak with Ms. Schuchart and to call back if there are questions or concerns.

## 2018-01-26 NOTE — Telephone Encounter (Signed)
Discussed her case with Dr Curt Bears. Other than avoiding caffeine, no specific lifestyle interventions will impact PVC's. The only other medication option is amiodarone. If they would like to consider amiodarone, benefit would have to be weighed against the risk of side effects, especially considering her significant lung disease. I would be in favor of tolerating the PVC's as I think amiodarone risk is significant in [name]' case.

## 2018-01-28 ENCOUNTER — Other Ambulatory Visit: Payer: Self-pay | Admitting: Internal Medicine

## 2018-01-28 ENCOUNTER — Ambulatory Visit
Admission: RE | Admit: 2018-01-28 | Discharge: 2018-01-28 | Disposition: A | Discharge status: Home / Self Care | Payer: PPO | Source: Ambulatory Visit | Attending: Internal Medicine | Admitting: Internal Medicine

## 2018-01-28 DIAGNOSIS — R1310 Dysphagia, unspecified: Secondary | ICD-10-CM | POA: Diagnosis not present

## 2018-01-28 DIAGNOSIS — K224 Dyskinesia of esophagus: Secondary | ICD-10-CM

## 2018-01-28 DIAGNOSIS — J181 Lobar pneumonia, unspecified organism: Secondary | ICD-10-CM

## 2018-01-28 DIAGNOSIS — R933 Abnormal findings on diagnostic imaging of other parts of digestive tract: Secondary | ICD-10-CM | POA: Diagnosis not present

## 2018-01-28 NOTE — Progress Notes (Signed)
Spoke with pt and notified of results per Dr. Wert. Pt verbalized understanding and denied any questions. 

## 2018-01-31 ENCOUNTER — Encounter: Payer: Self-pay | Admitting: Internal Medicine

## 2018-01-31 NOTE — Telephone Encounter (Signed)
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 Melvyn Novas. Pt verbalized understanding and denied any questions.  ------  Notes recorded by Tanda Rockers, MD on 01/28/2018 at 1:20 PM EDT Call patient :  Study is c/w es spasm that may be causing dysphagia but not an immediate concern > needs GI referral to whoever she already sees for GI or our group if not established

## 2018-02-01 ENCOUNTER — Ambulatory Visit: Payer: PPO | Admitting: Pulmonary Disease

## 2018-02-17 ENCOUNTER — Encounter: Payer: Self-pay | Admitting: Emergency Medicine

## 2018-02-17 ENCOUNTER — Ambulatory Visit: Payer: PPO | Admitting: Emergency Medicine

## 2018-02-17 DIAGNOSIS — J301 Allergic rhinitis due to pollen: Secondary | ICD-10-CM | POA: Diagnosis not present

## 2018-02-17 DIAGNOSIS — R058 Other specified cough: Secondary | ICD-10-CM

## 2018-02-17 DIAGNOSIS — J449 Chronic obstructive pulmonary disease, unspecified: Secondary | ICD-10-CM | POA: Diagnosis not present

## 2018-02-17 DIAGNOSIS — J9611 Chronic respiratory failure with hypoxia: Secondary | ICD-10-CM

## 2018-02-17 DIAGNOSIS — R05 Cough: Secondary | ICD-10-CM

## 2018-02-17 DIAGNOSIS — K219 Gastro-esophageal reflux disease without esophagitis: Secondary | ICD-10-CM

## 2018-02-17 MED ORDER — PANTOPRAZOLE SODIUM 40 MG PO TBEC: 40.0000 mg | DELAYED_RELEASE_TABLET | Freq: Two times a day (BID) | ORAL | 6 refills | Status: DC

## 2018-02-17 NOTE — Assessment & Plan Note (Signed)
Continue DuoNeb as ordered.

## 2018-02-17 NOTE — Assessment & Plan Note (Signed)
Discussed better oxygen compliance with her today, especially with exertion.  She does not wear it reliably.

## 2018-02-17 NOTE — Progress Notes (Signed)
Subjective:    Patient ID: [name], female    DOB: April 10, 1941, 77 y.o.   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  As above her most recent imaging shows a slight increase in size of a right lower lobe pleural-based nodule, stable left lower lobe nodule (but enlarging compared with 2017). We were going to bx the LLL nodule but it had decreased in size so the bx was not done. Several call from her since last time, worsening sx of cough, fatigue, dyspnea. She was given prednisone, doxycycline. She has significant fatigue, weakness, ? Since her b-blocker was added back earlier this year. Her cough is a bit better. She is on loratadine and flonase during the warm months. Off prednisone now. She is on duoneb q4h on a schedule.   ROV 02/17/18 --77 year old woman with a history of COPD, chronic cough with influences from esophageal reflux, adenocarcinoma of the lung with a right lower lobe superior segmental segmentectomy.  We have been following pulmonary nodules with serial imaging that have waxed and waned in size.  She is been dealing with chronic cough and purulent mucus production, has been treated on multiple occasions for bronchitis and/or flares.  She believes her cough is being impacted by labile GERD, that she was better when she was on Protonix twice a day.  She underwent swallowing eval 7/5 that showed distal spasms, ? contributing to reflux and cough. She has chronic hypoxemia but she does not always wear her oxygen with exertion. Her cough right now is well managed. Currently on protonix only qd. She has noticed some R flank and pleuritic pain.   A PET scan was done to follow her nodules and risk assess on 01/25/2018.  I have reviewed and this shows stable size of her pleural-based left lower lobe nodule but an  increase in its hypermetabolic activity, now SUV 6.1.  Her pleural-based right lower lobe mass is also increased in size, has hypermetabolic activity.  There was no evidence of hypermetabolic lymphadenopathy.    Review of Systems     As per history of present illness    Objective:   Physical Exam Vitals:   02/17/18 0931  BP: 124/82  Pulse: 68  SpO2: 98%  Weight: 126 lb 6.4 oz (57.3 kg)  Height: 5\' 8"  (1.727 m)    Gen: Anxious, elderly woman, in mild resp distress  ENT: No lesions,  mouth clear,  oropharynx clear, no postnasal drip, weak hoarse voice  Neck: No JVD, no stridor  Lungs: tachypneic, small breaths but no wheezing. No wheeze on forced exp  Cardiovascular: RRR, heart sounds normal, no murmur or gallops, no peripheral edema. Her HR on my recheck was in the 60's  Musculoskeletal: No deformities, no cyanosis or clubbing  Neuro: alert, non focal  Skin: Warm, no lesions or rashes      Assessment & Plan:  Pulmonary nodules Bilateral pulmonary nodules, the right nodule is increasing in size.  Both are hypermetabolic.  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  I discussed this with her in detail today.  She is hesitant to pursue biopsy unless there is true benefit to treatment.  She wants to keep track of her breathing, chest discomfort and then make a decision about whether biopsy is in her best interests.  I think ultimately  she will decide to pursue.  I explained her that she would potentially be a candidate for radiation therapy.  We will follow-up and discuss further in a month  COPD (chronic obstructive pulmonary disease) ([place]) Continue DuoNeb as ordered.  GERD She believes that her GERD is slightly more active, is influencing her cough..  She is had benefitWe will increase her Protonix to twice daily in the past at this dose.  She will continue this until we follow-up  Allergic rhinitis Continue her current allergy  regimen  Productive cough Suspect contribution and influence from allergic rhinitis, GERD.  Both of these are only marginally successfully treated.  We will try to address both as above.  No evidence of bronchitic symptoms at this time.  Chronic respiratory failure with hypoxia (HCC) Discussed better oxygen compliance with her today, especially with exertion.  She does not wear it reliably.  Baltazar Apo, MD, PhD 02/17/2018, 10:06 AM [facility] [phone] or if no answer [phone]

## 2018-02-17 NOTE — Patient Instructions (Addendum)
Please continue your DuoNeb four time a day.  Try increasing your protonix to twice a day to see if this helps your cough.  Continue your oxygen at night and with exertion.  Continue your flonase and loratadine.  We discussed your PET scan today.  There have been some changes that are suggestive of slow-growing primary lung cancer.  We do not know that this is the case but there is suspicion.  We agreed to follow your symptoms, especially your right sided chest and flank discomfort, and then discuss possible biopsy of your right sided pulmonary nodule at our next visit. Follow with Dr Lamonte Sakai in 1 month

## 2018-02-17 NOTE — Assessment & Plan Note (Signed)
She believes that her GERD is slightly more active, is influencing her cough..  She is had benefitWe will increase her Protonix to twice daily in the past at this dose.  She will continue this until we follow-up

## 2018-02-17 NOTE — Assessment & Plan Note (Signed)
Suspect contribution and influence from allergic rhinitis, GERD.  Both of these are only marginally successfully treated.  We will try to address both as above.  No evidence of bronchitic symptoms at this time.

## 2018-02-17 NOTE — Assessment & Plan Note (Signed)
Continue her current allergy regimen

## 2018-02-17 NOTE — Assessment & Plan Note (Signed)
Bilateral pulmonary nodules, the right nodule is increasing in size.  Both are hypermetabolic.  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  I discussed this with her in detail today.  She is hesitant to pursue biopsy unless there is true benefit to treatment.  She wants to keep track of her breathing, chest discomfort and then make a decision about whether biopsy is in her best interests.  I think ultimately she will decide to pursue.  I explained her that she would potentially be a candidate for radiation therapy.  We will follow-up and discuss further in a month

## 2018-02-18 DIAGNOSIS — R269 Unspecified abnormalities of gait and mobility: Secondary | ICD-10-CM | POA: Diagnosis not present

## 2018-02-18 DIAGNOSIS — J449 Chronic obstructive pulmonary disease, unspecified: Secondary | ICD-10-CM | POA: Diagnosis not present

## 2018-02-18 DIAGNOSIS — R911 Solitary pulmonary nodule: Secondary | ICD-10-CM | POA: Diagnosis not present

## 2018-02-18 DIAGNOSIS — E785 Hyperlipidemia, unspecified: Secondary | ICD-10-CM | POA: Diagnosis not present

## 2018-02-24 ENCOUNTER — Emergency Department (HOSPITAL_COMMUNITY): Payer: PPO

## 2018-02-24 ENCOUNTER — Encounter (HOSPITAL_COMMUNITY): Payer: Self-pay | Admitting: Emergency Medicine

## 2018-02-24 ENCOUNTER — Other Ambulatory Visit: Payer: Self-pay

## 2018-02-24 ENCOUNTER — Inpatient Hospital Stay (HOSPITAL_COMMUNITY)
Admission: EM | Admit: 2018-02-24 | Discharge: 2018-02-27 | DRG: 312 | Disposition: A | Discharge status: Home Health | Payer: PPO | Attending: Internal Medicine | Admitting: Internal Medicine

## 2018-02-24 DIAGNOSIS — Z888 Allergy status to other drugs, medicaments and biological substances status: Secondary | ICD-10-CM

## 2018-02-24 DIAGNOSIS — Z66 Do not resuscitate: Secondary | ICD-10-CM | POA: Diagnosis present

## 2018-02-24 DIAGNOSIS — J441 Chronic obstructive pulmonary disease with (acute) exacerbation: Secondary | ICD-10-CM

## 2018-02-24 DIAGNOSIS — Z79899 Other long term (current) drug therapy: Secondary | ICD-10-CM

## 2018-02-24 DIAGNOSIS — I493 Ventricular premature depolarization: Secondary | ICD-10-CM | POA: Diagnosis not present

## 2018-02-24 DIAGNOSIS — I472 Ventricular tachycardia: Secondary | ICD-10-CM | POA: Diagnosis not present

## 2018-02-24 DIAGNOSIS — I25118 Atherosclerotic heart disease of native coronary artery with other forms of angina pectoris: Secondary | ICD-10-CM | POA: Diagnosis not present

## 2018-02-24 DIAGNOSIS — N183 Chronic kidney disease, stage 3 unspecified: Secondary | ICD-10-CM | POA: Diagnosis present

## 2018-02-24 DIAGNOSIS — Z9981 Dependence on supplemental oxygen: Secondary | ICD-10-CM

## 2018-02-24 DIAGNOSIS — Z7951 Long term (current) use of inhaled steroids: Secondary | ICD-10-CM

## 2018-02-24 DIAGNOSIS — I252 Old myocardial infarction: Secondary | ICD-10-CM

## 2018-02-24 DIAGNOSIS — Z85118 Personal history of other malignant neoplasm of bronchus and lung: Secondary | ICD-10-CM

## 2018-02-24 DIAGNOSIS — Z87891 Personal history of nicotine dependence: Secondary | ICD-10-CM

## 2018-02-24 DIAGNOSIS — M810 Age-related osteoporosis without current pathological fracture: Secondary | ICD-10-CM | POA: Diagnosis not present

## 2018-02-24 DIAGNOSIS — H3530 Unspecified macular degeneration: Secondary | ICD-10-CM | POA: Diagnosis not present

## 2018-02-24 DIAGNOSIS — J449 Chronic obstructive pulmonary disease, unspecified: Secondary | ICD-10-CM

## 2018-02-24 DIAGNOSIS — E161 Other hypoglycemia: Secondary | ICD-10-CM | POA: Diagnosis not present

## 2018-02-24 DIAGNOSIS — I2510 Atherosclerotic heart disease of native coronary artery without angina pectoris: Secondary | ICD-10-CM | POA: Diagnosis not present

## 2018-02-24 DIAGNOSIS — J9601 Acute respiratory failure with hypoxia: Secondary | ICD-10-CM | POA: Diagnosis not present

## 2018-02-24 DIAGNOSIS — I4891 Unspecified atrial fibrillation: Secondary | ICD-10-CM

## 2018-02-24 DIAGNOSIS — J9621 Acute and chronic respiratory failure with hypoxia: Secondary | ICD-10-CM | POA: Diagnosis present

## 2018-02-24 DIAGNOSIS — I255 Ischemic cardiomyopathy: Secondary | ICD-10-CM | POA: Diagnosis not present

## 2018-02-24 DIAGNOSIS — Z681 Body mass index (BMI) 19 or less, adult: Secondary | ICD-10-CM

## 2018-02-24 DIAGNOSIS — R0602 Shortness of breath: Secondary | ICD-10-CM | POA: Diagnosis not present

## 2018-02-24 DIAGNOSIS — I491 Atrial premature depolarization: Secondary | ICD-10-CM | POA: Diagnosis not present

## 2018-02-24 DIAGNOSIS — S0990XA Unspecified injury of head, initial encounter: Secondary | ICD-10-CM | POA: Diagnosis not present

## 2018-02-24 DIAGNOSIS — I480 Paroxysmal atrial fibrillation: Secondary | ICD-10-CM | POA: Diagnosis not present

## 2018-02-24 DIAGNOSIS — R55 Syncope and collapse: Secondary | ICD-10-CM | POA: Diagnosis not present

## 2018-02-24 DIAGNOSIS — K219 Gastro-esophageal reflux disease without esophagitis: Secondary | ICD-10-CM | POA: Diagnosis not present

## 2018-02-24 DIAGNOSIS — Z955 Presence of coronary angioplasty implant and graft: Secondary | ICD-10-CM

## 2018-02-24 DIAGNOSIS — C3431 Malignant neoplasm of lower lobe, right bronchus or lung: Secondary | ICD-10-CM | POA: Diagnosis present

## 2018-02-24 DIAGNOSIS — I130 Hypertensive heart and chronic kidney disease with heart failure and stage 1 through stage 4 chronic kidney disease, or unspecified chronic kidney disease: Secondary | ICD-10-CM | POA: Diagnosis not present

## 2018-02-24 DIAGNOSIS — R7303 Prediabetes: Secondary | ICD-10-CM | POA: Diagnosis present

## 2018-02-24 DIAGNOSIS — J439 Emphysema, unspecified: Secondary | ICD-10-CM | POA: Diagnosis present

## 2018-02-24 DIAGNOSIS — E785 Hyperlipidemia, unspecified: Secondary | ICD-10-CM | POA: Diagnosis not present

## 2018-02-24 DIAGNOSIS — R0600 Dyspnea, unspecified: Secondary | ICD-10-CM

## 2018-02-24 DIAGNOSIS — Z7902 Long term (current) use of antithrombotics/antiplatelets: Secondary | ICD-10-CM

## 2018-02-24 DIAGNOSIS — I499 Cardiac arrhythmia, unspecified: Secondary | ICD-10-CM | POA: Diagnosis not present

## 2018-02-24 DIAGNOSIS — J9611 Chronic respiratory failure with hypoxia: Secondary | ICD-10-CM | POA: Diagnosis present

## 2018-02-24 DIAGNOSIS — Z885 Allergy status to narcotic agent status: Secondary | ICD-10-CM

## 2018-02-24 DIAGNOSIS — E440 Moderate protein-calorie malnutrition: Secondary | ICD-10-CM | POA: Diagnosis present

## 2018-02-24 DIAGNOSIS — I519 Heart disease, unspecified: Secondary | ICD-10-CM | POA: Diagnosis not present

## 2018-02-24 DIAGNOSIS — Z8673 Personal history of transient ischemic attack (TIA), and cerebral infarction without residual deficits: Secondary | ICD-10-CM | POA: Diagnosis not present

## 2018-02-24 DIAGNOSIS — Z7982 Long term (current) use of aspirin: Secondary | ICD-10-CM

## 2018-02-24 DIAGNOSIS — I5043 Acute on chronic combined systolic (congestive) and diastolic (congestive) heart failure: Secondary | ICD-10-CM | POA: Diagnosis present

## 2018-02-24 DIAGNOSIS — I5042 Chronic combined systolic (congestive) and diastolic (congestive) heart failure: Secondary | ICD-10-CM | POA: Diagnosis present

## 2018-02-24 DIAGNOSIS — L8990 Pressure ulcer of unspecified site, unspecified stage: Secondary | ICD-10-CM | POA: Diagnosis present

## 2018-02-24 DIAGNOSIS — R911 Solitary pulmonary nodule: Secondary | ICD-10-CM | POA: Diagnosis not present

## 2018-02-24 DIAGNOSIS — R269 Unspecified abnormalities of gait and mobility: Secondary | ICD-10-CM | POA: Diagnosis not present

## 2018-02-24 DIAGNOSIS — I447 Left bundle-branch block, unspecified: Secondary | ICD-10-CM | POA: Diagnosis not present

## 2018-02-24 HISTORY — DX: Syncope and collapse: R55

## 2018-02-24 HISTORY — DX: Prediabetes: R73.03

## 2018-02-24 LAB — URINALYSIS, ROUTINE W REFLEX MICROSCOPIC
Bilirubin Urine: NEGATIVE
Glucose, UA: NEGATIVE mg/dL
Hgb urine dipstick: NEGATIVE
KETONES UR: NEGATIVE mg/dL
LEUKOCYTES UA: NEGATIVE
NITRITE: NEGATIVE
PROTEIN: NEGATIVE mg/dL
Specific Gravity, Urine: 1.005 (ref 1.005–1.030)
pH: 6 (ref 5.0–8.0)

## 2018-02-24 LAB — BASIC METABOLIC PANEL
Anion gap: 8 (ref 5–15)
BUN: 19 mg/dL (ref 8–23)
CHLORIDE: 102 mmol/L (ref 98–111)
CO2: 28 mmol/L (ref 22–32)
CREATININE: 0.91 mg/dL (ref 0.44–1.00)
Calcium: 9.5 mg/dL (ref 8.9–10.3)
GFR, EST NON AFRICAN AMERICAN: 59 mL/min — AB (ref >60)
Glucose, Bld: 106 mg/dL — ABNORMAL HIGH (ref 70–99)
Potassium: 4.6 mmol/L (ref 3.5–5.1)
SODIUM: 138 mmol/L (ref 135–145)

## 2018-02-24 LAB — CBC
HCT: 38.7 % (ref 36.0–46.0)
Hemoglobin: 13.1 g/dL (ref 12.0–15.0)
MCH: 32.3 pg (ref 26.0–34.0)
MCHC: 33.9 g/dL (ref 30.0–36.0)
MCV: 95.6 fL (ref 78.0–100.0)
Platelets: 213 10*3/uL (ref 150–400)
RBC: 4.05 MIL/uL (ref 3.87–5.11)
RDW: 13.5 % (ref 11.5–15.5)
WBC: 10.2 10*3/uL (ref 4.0–10.5)

## 2018-02-24 LAB — I-STAT TROPONIN, ED: TROPONIN I, POC: 0.01 ng/mL (ref 0.00–0.08)

## 2018-02-24 LAB — CBG MONITORING, ED: GLUCOSE-CAPILLARY: 108 mg/dL — AB (ref 70–99)

## 2018-02-24 LAB — BRAIN NATRIURETIC PEPTIDE: B NATRIURETIC PEPTIDE 5: 341.8 pg/mL — AB (ref 0.0–100.0)

## 2018-02-24 MED ORDER — IPRATROPIUM-ALBUTEROL 0.5-2.5 (3) MG/3ML IN SOLN
3.0000 mL | Freq: Once | RESPIRATORY_TRACT | Status: AC
  Administered 2018-02-24: 3 mL via RESPIRATORY_TRACT
  Filled 2018-02-24: qty 3

## 2018-02-24 MED ORDER — DOXYCYCLINE HYCLATE 100 MG PO TABS
100.0000 mg | ORAL_TABLET | Freq: Two times a day (BID) | ORAL | Status: DC
  Administered 2018-02-24 – 2018-02-27 (×6): 100 mg via ORAL
  Filled 2018-02-24 (×6): qty 1

## 2018-02-24 MED ORDER — FUROSEMIDE 20 MG PO TABS
20.0000 mg | ORAL_TABLET | Freq: Every day | ORAL | Status: DC
  Administered 2018-02-25 – 2018-02-27 (×3): 20 mg via ORAL
  Filled 2018-02-24 (×3): qty 1

## 2018-02-24 MED ORDER — PREDNISOLONE ACETATE 1 % OP SUSP
1.0000 [drp] | Freq: Every day | OPHTHALMIC | Status: DC
  Administered 2018-02-26 – 2018-02-27 (×2): 1 [drp] via OPHTHALMIC
  Filled 2018-02-24: qty 5

## 2018-02-24 MED ORDER — ONDANSETRON HCL 4 MG PO TABS: 4.0000 mg | ORAL_TABLET | Freq: Four times a day (QID) | ORAL | Status: DC | PRN

## 2018-02-24 MED ORDER — SODIUM CHLORIDE 0.9% FLUSH
3.0000 mL | Freq: Two times a day (BID) | INTRAVENOUS | Status: DC
  Administered 2018-02-24 – 2018-02-26 (×4): 3 mL via INTRAVENOUS

## 2018-02-24 MED ORDER — IPRATROPIUM-ALBUTEROL 0.5-2.5 (3) MG/3ML IN SOLN
3.0000 mL | Freq: Four times a day (QID) | RESPIRATORY_TRACT | Status: DC
  Administered 2018-02-24 – 2018-02-25 (×2): 3 mL via RESPIRATORY_TRACT
  Filled 2018-02-24 (×2): qty 3

## 2018-02-24 MED ORDER — BUDESONIDE 0.5 MG/2ML IN SUSP
2.0000 mg | Freq: Four times a day (QID) | RESPIRATORY_TRACT | Status: DC
  Administered 2018-02-24: 2 mg via RESPIRATORY_TRACT
  Filled 2018-02-24 (×2): qty 8

## 2018-02-24 MED ORDER — ENOXAPARIN SODIUM 40 MG/0.4ML ~~LOC~~ SOLN
40.0000 mg | SUBCUTANEOUS | Status: DC
  Administered 2018-02-24 – 2018-02-26 (×3): 40 mg via SUBCUTANEOUS
  Filled 2018-02-24 (×3): qty 0.4

## 2018-02-24 MED ORDER — LACTATED RINGERS IV SOLN: INTRAVENOUS | Status: DC

## 2018-02-24 MED ORDER — ISOSORBIDE MONONITRATE ER 30 MG PO TB24
30.0000 mg | ORAL_TABLET | Freq: Every day | ORAL | Status: DC
  Administered 2018-02-25 – 2018-02-27 (×3): 30 mg via ORAL
  Filled 2018-02-24 (×3): qty 1

## 2018-02-24 MED ORDER — SPIRONOLACTONE 25 MG PO TABS
25.0000 mg | ORAL_TABLET | Freq: Every day | ORAL | Status: DC
  Administered 2018-02-25 – 2018-02-27 (×3): 25 mg via ORAL
  Filled 2018-02-24 (×3): qty 1

## 2018-02-24 MED ORDER — SODIUM CHLORIDE 0.9% FLUSH
3.0000 mL | Freq: Two times a day (BID) | INTRAVENOUS | Status: DC
  Administered 2018-02-25 – 2018-02-27 (×4): 3 mL via INTRAVENOUS

## 2018-02-24 MED ORDER — ACETAMINOPHEN 325 MG PO TABS
650.0000 mg | ORAL_TABLET | Freq: Four times a day (QID) | ORAL | Status: DC | PRN
  Administered 2018-02-25: 650 mg via ORAL
  Filled 2018-02-24: qty 2

## 2018-02-24 MED ORDER — LOTEPREDNOL ETABONATE 0.5 % OP SUSP
1.0000 [drp] | Freq: Every day | OPHTHALMIC | Status: DC
  Administered 2018-02-25 – 2018-02-27 (×3): 1 [drp] via OPHTHALMIC
  Filled 2018-02-24: qty 5

## 2018-02-24 MED ORDER — ASPIRIN EC 81 MG PO TBEC
81.0000 mg | DELAYED_RELEASE_TABLET | Freq: Every day | ORAL | Status: DC
  Administered 2018-02-25 – 2018-02-27 (×3): 81 mg via ORAL
  Filled 2018-02-24 (×3): qty 1

## 2018-02-24 MED ORDER — CLOPIDOGREL BISULFATE 75 MG PO TABS
75.0000 mg | ORAL_TABLET | Freq: Every day | ORAL | Status: DC
  Administered 2018-02-25 – 2018-02-27 (×3): 75 mg via ORAL
  Filled 2018-02-24 (×3): qty 1

## 2018-02-24 MED ORDER — FLUTICASONE PROPIONATE 50 MCG/ACT NA SUSP
2.0000 | Freq: Every day | NASAL | Status: DC
  Administered 2018-02-25 – 2018-02-27 (×3): 2 via NASAL
  Filled 2018-02-24: qty 16

## 2018-02-24 MED ORDER — PANTOPRAZOLE SODIUM 40 MG PO TBEC
40.0000 mg | DELAYED_RELEASE_TABLET | Freq: Two times a day (BID) | ORAL | Status: DC
  Administered 2018-02-24 – 2018-02-27 (×6): 40 mg via ORAL
  Filled 2018-02-24 (×6): qty 1

## 2018-02-24 MED ORDER — SIMVASTATIN 40 MG PO TABS
40.0000 mg | ORAL_TABLET | Freq: Every day | ORAL | Status: DC
  Administered 2018-02-24 – 2018-02-26 (×3): 40 mg via ORAL
  Filled 2018-02-24 (×3): qty 1

## 2018-02-24 MED ORDER — ACETAMINOPHEN 650 MG RE SUPP: 650.0000 mg | Freq: Four times a day (QID) | RECTAL | Status: DC | PRN

## 2018-02-24 MED ORDER — ALBUTEROL SULFATE (2.5 MG/3ML) 0.083% IN NEBU: 2.5000 mg | INHALATION_SOLUTION | RESPIRATORY_TRACT | Status: DC | PRN

## 2018-02-24 MED ORDER — ONDANSETRON HCL 4 MG/2ML IJ SOLN: 4.0000 mg | Freq: Four times a day (QID) | INTRAMUSCULAR | Status: DC | PRN

## 2018-02-24 NOTE — ED Provider Notes (Signed)
[facility] EMERGENCY DEPARTMENT Provider Note   CSN: [id number] Arrival date & time: 02/24/18  1134     History   Chief Complaint Chief Complaint  Patient presents with  . Atrial Fibrillation  . Loss of Consciousness    HPI [name] is a 77 y.o. female.  HPI Pt was seen at 1250. Per EMS, pt's family and pt: Pt c/o sudden onset and resolution of one episode of syncope that occurred PTA. Pt states she was walking her dog outside when she felt SOB, nauseated and lightheaded. Pt states she woke up on the ground feeling rapid palpitations. Pt states she crawled into her house and called EMS. EMS noted monitor afib/RVR with rates 140-170's. EMS gave IV metoprolol 5mg  with monitor changing to NSR. Pt states she "feels better now." Denies hx afib. Denies cough, no CP, no abd pain, no vomiting/diarrhea, no back or neck pain, no focal motor weakness, no tingling/numbness in extremities.   Past Medical History:  Diagnosis Date  . Arthritis   . CAD (coronary artery disease) 03/2009   s/p MI  . 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 CKD (chronic kidney disease) stage 3, GFR 30-59 ml/min (HCC)   . Emphysema/COPD ([place])   . Ex-smoker quit 2008  . GERD (gastroesophageal reflux disease)   . HCAP (healthcare-associated pneumonia) 03/19/2016  . Helicobacter pylori gastritis 10/2007   treated  . History of CVA (cerebrovascular accident) without residual deficits 01/2012, 06/2012   R hemorrhagic MCA 01/2012 with remote lacunar infarct L putamen and IC, rpt 06/2012 acute multifocal R MCA infarct with remote hemorrhagic strokes affecting L basal ganglia and periventricular white matter, full recovery  . HLD (hyperlipidemia)   . HTN (hypertension)   . Ischemic cardiomyopathy 02/27/2014  . Lung cancer ([place]) dx'd 07/2006   Lung CA, s/p resection, followed by Dr Earlie Server  . Macular  degeneration   . Myocardial infarction [facility]) 03/2009   Acute myocardial infarction 2010 - treated with BMS of LCx. LVEF 50% with subsequent CHF  . NSTEMI (non-ST elevated myocardial infarction) ([place])   . Osteoporosis 09/2013   T -3.6 forearm 09/2013, T -4.5 forearm 04/2015  . Stroke [facility])     Patient Active Problem List   Diagnosis Date Noted  . Lobar pneumonia, unspecified organism ([place]) 12/30/2017  . Chronic respiratory failure with hypoxia ([place]) 11/23/2017  . PVC's (premature ventricular contractions) 08/31/2017  . Skin rash 08/02/2017  . History of ST elevation myocardial infarction (STEMI) 07/06/2017  . Dyspnea 05/03/2017  . DNR (do not resuscitate) 12/07/2016  . Allergic rhinitis 07/31/2016  . Productive cough 06/29/2016  . Symptomatic bradycardia 04/01/2016  . Malnutrition of moderate degree 03/20/2016  . COPD (chronic obstructive pulmonary disease) ([place]) 03/19/2016  . Lumbar pain with radiation down left leg 03/02/2016  . Underweight 10/07/2015  . Vertigo 04/22/2015  . Skin nodule 04/22/2015  . Health maintenance examination 04/08/2015  . Advanced care planning/counseling discussion 04/08/2015  . Vitamin D insufficiency 02/02/2015  . CKD (chronic kidney disease) stage 3, GFR 30-59 ml/min (HCC)   . History of CVA (cerebrovascular accident) without residual deficits   . Prediabetes   . Ischemic cardiomyopathy 02/27/2014  . Osteoporosis 09/24/2013  . Pulmonary nodules 10/26/2012  . History of TIA (transient ischemic attack) 07/08/2012  . 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 Chronic combined systolic and diastolic heart failure ([place])  06/17/2009  . HLD (hyperlipidemia) 04/24/2009  . CAD (coronary artery disease) 04/24/2009  . GERD 04/24/2009  . Macular degeneration (senile) of retina 04/23/2009  . Essential hypertension 04/23/2009  . Osteoarthritis 04/23/2009    Past Surgical History:  Procedure Laterality Date  . BREAST BIOPSY Left   . CARDIAC  CATHETERIZATION N/A 02/24/2016   Procedure: Left Heart Cath and Coronary Angiography;  Surgeon: Belva Crome, MD;  Location: [facility] CV LAB;  Service: Cardiovascular;  Laterality: N/A;  . CARDIAC CATHETERIZATION N/A 07/09/2016   Procedure: Left Heart Cath and Coronary Angiography;  Surgeon: Peter M Martinique, MD;  Location: [place] [facility];  Service: Cardiovascular;  Laterality: N/A;  . CARDIAC CATHETERIZATION N/A 07/09/2016   Procedure: Coronary Stent Intervention;  Surgeon: Peter M Martinique, MD;  Location: [facility] CV LAB;  Service: Cardiovascular;  Laterality: N/A;  . CATARACT EXTRACTION Bilateral   . CORONARY STENT INTERVENTION N/A 07/06/2017   Procedure: CORONARY STENT INTERVENTION;  Surgeon: Lorretta Harp, MD;  Location: [facility] CV LAB;  Service: Cardiovascular;  Laterality: N/A;  . CORONARY STENT PLACEMENT  06/2016   DES to mid LAD  . CORONARY/GRAFT ACUTE MI REVASCULARIZATION N/A 07/06/2017   Procedure: Coronary/Graft Acute MI Revascularization;  Surgeon: Lorretta Harp, MD;  Location: [facility] CV LAB;  Service: Cardiovascular;  Laterality: N/A;  . EYE SURGERY    . LEFT HEART CATH AND CORONARY ANGIOGRAPHY N/A 07/06/2017   Procedure: LEFT HEART CATH AND CORONARY ANGIOGRAPHY;  Surgeon: Lorretta Harp, MD;  Location: [facility] CV LAB;  Service: Cardiovascular;  Laterality: N/A;  . LUNG REMOVAL, PARTIAL Right 2008  . PARTIAL HYSTERECTOMY  1986   irregular periods, ovaries remain     OB History   None      Home Medications    Prior to Admission medications   Medication Sig Start Date End Date Taking? Authorizing Provider  albuterol (PROAIR HFA) 108 (90 Base) MCG/ACT inhaler Inhale 2 puffs into the lungs every 4 (four) hours as needed for wheezing or shortness of breath. 09/16/17  Yes Byrum, Rose Fillers, MD  albuterol (PROVENTIL) (2.5 MG/3ML) 0.083% nebulizer solution Take 3 mLs (2.5 mg total) by nebulization every 4 (four) hours as needed for wheezing or  shortness of breath. 07/31/16  Yes Collene Gobble, MD  aspirin EC 81 MG tablet Take 1 tablet (81 mg total) by mouth daily. 02/26/16  Yes Arbutus Leas, NP  calcium carbonate (OS-CAL - DOSED IN MG OF ELEMENTAL CALCIUM) 1250 (500 Ca) MG tablet Take 1 tablet by mouth 2 (two) times daily with a meal.   Yes [provider]  clopidogrel (PLAVIX) 75 MG tablet Take 1 tablet (75 mg total) by mouth daily. 07/30/17 07/25/18 Yes Sherren Mocha, MD  denosumab (PROLIA) 60 MG/ML SOLN injection Inject 60 mg into the skin every 6 (six) months. Administer in upper arm, thigh, or abdomen 06/13/15  Yes Ria Bush, MD  fluticasone [facility]) 50 MCG/ACT nasal spray INSTILL 2 SPRAYS INTO BOTH NOSTRILS ONCEDAILY AS DIRECTED 12/08/17  Yes Byrum, Rose Fillers, MD  furosemide (LASIX) 20 MG tablet Take 1 tablet (20 mg total) by mouth daily. Take an additional 1/2 tablet daily for swelling. 07/09/17 07/04/18 Yes Kroeger, Daleen Snook M., PA-C  isosorbide mononitrate (IMDUR) 30 MG 24 hr tablet TAKE 1 TABLET BY MOUTH DAILY 04/30/17  Yes Sherren Mocha, MD  loratadine (CLARITIN) 10 MG tablet Take 10 mg by mouth daily as needed for allergies or rhinitis.    Yes [provider]  LOTEMAX 0.5 % ophthalmic suspension Place 1 drop into both eyes daily.  12/27/13  Yes [provider]  nitroGLYCERIN (NITROSTAT) 0.4 MG SL tablet Place 1 tablet (0.4 mg total) under the tongue every 5 (five) minutes as needed for chest pain. 07/03/15  Yes Weaver, Scott T, PA-C  pantoprazole (PROTONIX) 40 MG tablet Take 1 tablet (40 mg total) by mouth 2 (two) times daily. 02/17/18  Yes Collene Gobble, MD  prednisoLONE acetate (PRED FORTE) 1 % ophthalmic suspension 1 drop daily. Both eyes   Yes [provider]  simvastatin (ZOCOR) 40 MG tablet TAKE 1 TABLET BY MOUTH AT BEDTIME 10/06/17  Yes Sherren Mocha, MD  Spacer/Aero-Holding Chambers (AEROCHAMBER MV) inhaler Use as instructed 12/28/16  Yes Collene Gobble, MD  spironolactone (ALDACTONE)  25 MG tablet TAKE 1 TABLET BY MOUTH ONCE DAILY 12/29/17  Yes Sherren Mocha, MD  benzonatate (TESSALON) 200 MG capsule Take 1 capsule (200 mg total) by mouth 3 (three) times daily as needed for cough. Patient not taking: Reported on 02/17/2018 12/30/17   Lauraine Rinne, NP  ipratropium-albuterol (DUONEB) 0.5-2.5 (3) MG/3ML SOLN INHALE 1 VIAL VIA NEBULIZER FOUR TIMES ADAY AND EVERY 4 HOURS AS NEEDED AS DIRECTED Patient not taking: Reported on 02/24/2018 10/04/17   Collene Gobble, MD    Family History Family History  Problem Relation Age of Onset  . CAD Mother 43       MI  . Stroke Mother   . Hypertension Mother   . Heart attack Mother   . CAD Father 38  . Stroke Father   . Breast cancer Sister   . Diabetes Sister     Social History Social History   Tobacco Use  . Smoking status: Former Smoker    Packs/day: 0.50    Years: 42.00    Pack years: 21.00    Types: Cigarettes    Last attempt to quit: 07/27/2002    Years since quitting: 15.5  . Smokeless tobacco: Never Used  Substance Use Topics  . Alcohol use: No    Alcohol/week: 0.0 oz  . Drug use: No     Allergies   Prednisone; Actonel [risedronate sodium]; Amlodipine; Brilinta [ticagrelor]; Codeine; Fosamax [alendronate sodium]; Lisinopril; and Pravastatin   Review of Systems Review of Systems ROS: Statement: All systems negative except as marked or noted in the HPI; Constitutional: Negative for fever and chills. ; ; Eyes: Negative for eye pain, redness and discharge. ; ; ENMT: Negative for ear pain, hoarseness, nasal congestion, sinus pressure and sore throat. ; ; Cardiovascular: +palpitations, SOB. Negative for chest pain, diaphoresis, and peripheral edema. ; ; Respiratory: Negative for cough, wheezing and stridor. ; ; Gastrointestinal: +nausea. Negative for vomiting, diarrhea, abdominal pain, blood in stool, hematemesis, jaundice and rectal bleeding. . ; ; Genitourinary: Negative for dysuria, flank pain and hematuria. ; ;  Musculoskeletal: Negative for back pain and neck pain. Negative for swelling and trauma.; ; Skin: Negative for pruritus, rash, abrasions, blisters, bruising and skin lesion.; ; Neuro: Negative for headache and neck stiffness. Negative for extremity weakness, paresthesias, involuntary movement, seizure and +lightheadedness, syncope.       Physical Exam Updated Vital Signs BP 120/71   Pulse 83   Temp 98.1 F (36.7 C) (Oral)   Resp (!) 22   Ht 5\' 8"  (1.727 m)   Wt 57.2 kg (126 lb)   SpO2 95%   BMI 19.16 kg/m   13:30 Orthostatic Vital Signs TC  Orthostatic Lying  BP- Lying: 121/70   Pulse- Lying: 61       Orthostatic Sitting  BP- Sitting: 134/72   Pulse- Sitting: 64       Orthostatic Standing at 0 minutes  BP- Standing at 0 minutes: 115/89   Pulse- Standing at 0 minutes: 70      Physical Exam 1255: Physical examination:  Nursing notes reviewed; Vital signs and O2 SAT reviewed;  Constitutional: Well developed, Well nourished, Well hydrated, In no acute distress; Head:  Normocephalic, atraumatic; Eyes: EOMI, PERRL, No scleral icterus; ENMT: Mouth and pharynx normal, Mucous membranes moist; Neck: Supple, Full range of motion, No lymphadenopathy; Cardiovascular: Regular rate and rhythm, No gallop; Respiratory: Breath sounds clear & equal bilaterally, No wheezes.  Speaking full sentences with ease, Normal respiratory effort/excursion; Chest: Nontender, Movement normal; Abdomen: Soft, Nontender, Nondistended, Normal bowel sounds; Genitourinary: No CVA tenderness; Extremities: Peripheral pulses normal, No tenderness, No edema, No calf edema or asymmetry.; Neuro: AA&Ox3, Major CN grossly intact. No facial droop. Speech clear. No gross focal motor or sensory deficits in extremities.; Skin: Color normal, Warm, Dry.   ED Treatments / Results  Labs (all labs ordered are listed, but only abnormal results are displayed)   EKG EKG Interpretation  Date/Time:  Thursday February 24 2018  11:43:32 EDT Ventricular Rate:  67 PR Interval:    QRS Duration: 144 QT Interval:  402 QTC Calculation: 425 R Axis:   50 Text Interpretation:  Sinus rhythm Premature ventricular complexes IVCD, consider atypical LBBB When compared with ECG of 07/07/2017 No significant change was found Confirmed by Francine Graven [phone]) on 02/24/2018 1:16:32 PM   Radiology   Procedures Procedures (including critical care time)  Medications Ordered in ED Medications - No data to display   Initial Impression / Assessment and Plan / ED Course  I have reviewed the triage vital signs and the nursing notes.  Pertinent labs & imaging results that were available during my care of the patient were reviewed by me and considered in my medical decision making (see chart for details).  MDM Reviewed: previous chart, nursing note and vitals Reviewed previous: labs and ECG Interpretation: labs, ECG, x-ray and CT scan    Results for orders placed or performed during the hospital encounter of 18/29/93  Basic metabolic panel  Result Value Ref Range   Sodium 138 135 - 145 mmol/L   Potassium 4.6 3.5 - 5.1 mmol/L   Chloride 102 98 - 111 mmol/L   CO2 28 22 - 32 mmol/L   Glucose, Bld 106 (H) 70 - 99 mg/dL   BUN 19 8 - 23 mg/dL   Creatinine, Ser 0.91 0.44 - 1.00 mg/dL   Calcium 9.5 8.9 - 10.3 mg/dL   GFR calc non Af Amer 59 (L) >60 mL/min   GFR calc Af Amer >60 >60 mL/min   Anion gap 8 5 - 15  CBC  Result Value Ref Range   WBC 10.2 4.0 - 10.5 K/uL   RBC 4.05 3.87 - 5.11 MIL/uL   Hemoglobin 13.1 12.0 - 15.0 g/dL   HCT 38.7 36.0 - 46.0 %   MCV 95.6 78.0 - 100.0 fL   MCH 32.3 26.0 - 34.0 pg   MCHC 33.9 30.0 - 36.0 g/dL   RDW 13.5 11.5 - 15.5 %   Platelets 213 150 - 400 K/uL  Urinalysis, Routine w reflex microscopic  Result Value Ref Range   Color, Urine COLORLESS (A) YELLOW   APPearance CLEAR CLEAR   Specific Gravity, Urine 1.005  1.005 - 1.030   pH 6.0 5.0 - 8.0   Glucose, UA NEGATIVE NEGATIVE  mg/dL   Hgb urine dipstick NEGATIVE NEGATIVE   Bilirubin Urine NEGATIVE NEGATIVE   Ketones, ur NEGATIVE NEGATIVE mg/dL   Protein, ur NEGATIVE NEGATIVE mg/dL   Nitrite NEGATIVE NEGATIVE   Leukocytes, UA NEGATIVE NEGATIVE  Brain natriuretic peptide  Result Value Ref Range   B Natriuretic Peptide 341.8 (H) 0.0 - 100.0 pg/mL  CBG monitoring, ED  Result Value Ref Range   Glucose-Capillary 108 (H) 70 - 99 mg/dL  I-Stat Troponin, ED (not at [facility])  Result Value Ref Range   Troponin i, poc 0.01 0.00 - 0.08 ng/mL   Comment 3           Dg Chest 2 View Result Date: 02/24/2018 CLINICAL DATA:  Shortness of breath. EXAM: CHEST - 2 VIEW COMPARISON:  Radiographs of January 20, 2018. PET scan of January 25, 2018. FINDINGS: The heart size and mediastinal contours are within normal limits. Atherosclerosis of thoracic aorta is noted. No pneumothorax or pleural effusion is noted. Left lung is unremarkable. Right basilar density is noted which corresponds to possible malignancy noted on prior PET scan. The visualized skeletal structures are unremarkable. IMPRESSION: Right basilar density is noted which corresponds to possible mass and malignancy noted on prior PET scan. No other significant abnormality seen. Aortic Atherosclerosis (ICD10-I70.0). Electronically Signed   By: Marijo Conception, M.D.   On: 02/24/2018 13:11   Ct Head Wo Contrast Result Date: 02/24/2018 CLINICAL DATA:  Fall, syncope EXAM: CT HEAD WITHOUT CONTRAST TECHNIQUE: Contiguous axial images were obtained from the base of the skull through the vertex without intravenous contrast. COMPARISON:  MRI head 04/20/2015 FINDINGS: Brain: Chronic infarct in the right parietal lobe unchanged. Chronic lacunar infarction left basal ganglia unchanged. Chronic microvascular ischemia throughout the white matter. Negative for acute hemorrhage, mass, or acute infarct. Moderate atrophy. Vascular: Negative for hyperdense vessel Skull: Negative Sinuses/Orbits: Mild mucosal edema  paranasal sinuses. Bilateral cataract surgery. Other: None IMPRESSION: Atrophy and chronic ischemic change.  No acute abnormality. Electronically Signed   By: Franchot Gallo M.D.   On: 02/24/2018 14:21    1530:  Pt requesting her usual duoneb tx; will order. Pt without hx afib or syncope; will admit. Dx and testing d/w pt and family.  Questions answered.  Verb understanding, agreeable to admit.  T/C returned from [facility]. Lorin Mercy, case discussed, including:  HPI, pertinent PM/SHx, VS/PE, dx testing, ED course and treatment:  Agreeable to admit.     Final Clinical Impressions(s) / ED Diagnoses   Final diagnoses:  None    ED Discharge Orders    None       Francine Graven, DO 02/27/18 1347

## 2018-02-24 NOTE — ED Triage Notes (Signed)
Pt arriving via [facility] from [facility]. Pt was walking dog outside when she felt SOB and nauseous and then had a syncopal episode (unwitnessed). Pt crawled back inside house.  HR was in Afib with RVR rate 140-170 when EMS arrived.  Pt given 5mg  Metoprolol and then converted to Sinus rhythm. Pt also given 4mg  Zofran.  VS EMS 126/88, HR 68, RR 18, 98% SPo2 on 4L, CBG 80. Pt currently on 2L [place] at baseline.

## 2018-02-24 NOTE — ED Notes (Signed)
Got patient undress on the monitor did ekg shown to er doctor patient is resting with call bell in reach

## 2018-02-24 NOTE — ED Notes (Signed)
Patient transported to CT 

## 2018-02-24 NOTE — H&P (Addendum)
History and Physical    [name] SHF:[id number] DOB: 1940-12-20 DOA: 02/24/2018  PCP: Ria Bush, MD Consultants:  Burt Knack - cardiology; Byrum - pulmonology Patient coming from:  Home - lives alone; NOK: Niece, Almyra Free [phone]  Chief Complaint:  syncope  HPI: [name] is a 77 y.o. female with medical history significant of CVA; CAD; lung CA; HTN; HLD;  COPD; stage 3 CKD; and chronic combined heart failure presenting with syncope. She took her dog walking and she developed acute SOB.  She grabbed a clothesline and doesn't remember anything until she awoke in the grass with the dog licking her face.  She uses O2 at home at night and sometimes during the day, but was not wearing her O2 outside.  She does have a h/o afib.  She does feel intermittent light-headedness in the ER.  She uses q4h nebs at home.  +cough, worse than usual.  Increase amount of brown-colored sputum.  She feels SOB every day, just not like today.  She has been told that she is supposed to wear her O2 24/7.  ED Course:  Outside walking dog, felt SOB and nauseated.  Passed out.  EMS - afib with RVR.  No h/o afib.  EMS gave metoprolol, but she may have significant medication side effects.  Review of Systems: As per HPI; otherwise review of systems reviewed and negative.   Ambulatory Status:  Ambulates without assistance  Past Medical History:  Diagnosis Date  . Arthritis   . CAD (coronary artery disease) 03/2009   s/p MI  . Chronic combined systolic and diastolic heart failure ([place]) 06/17/2009   Qualifier: Diagnosis of  By: Burt Knack, MD, Clayburn Pert   . CKD (chronic kidney disease) stage 3, GFR 30-59 ml/min (HCC)   . Emphysema/COPD ([place])   . GERD (gastroesophageal reflux disease)   . HCAP (healthcare-associated pneumonia) 03/19/2016  . Helicobacter pylori gastritis 10/2007   treated  . History of CVA (cerebrovascular accident) without residual deficits 01/2012, 06/2012   R hemorrhagic MCA 01/2012 with  remote lacunar infarct L putamen and IC, rpt 06/2012 acute multifocal R MCA infarct with remote hemorrhagic strokes affecting L basal ganglia and periventricular white matter, full recovery  . HLD (hyperlipidemia)   . HTN (hypertension)   . Ischemic cardiomyopathy 02/27/2014  . Lung cancer ([place]) dx'd 07/2006   Lung CA, s/p resection, followed by Dr Earlie Server  . Macular degeneration   . Myocardial infarction [facility]) 03/2009   Acute myocardial infarction 2010 - treated with BMS of LCx. LVEF 50% with subsequent CHF  . NSTEMI (non-ST elevated myocardial infarction) ([place])   . Osteoporosis 09/2013   T -3.6 forearm 09/2013, T -4.5 forearm 04/2015  . Pre-diabetes   . Syncope 02/24/2018    Past Surgical History:  Procedure Laterality Date  . BREAST BIOPSY Left   . CARDIAC CATHETERIZATION N/A 02/24/2016   Procedure: Left Heart Cath and Coronary Angiography;  Surgeon: Belva Crome, MD;  Location: [place] [facility];  Service: Cardiovascular;  Laterality: N/A;  . CARDIAC CATHETERIZATION N/A 07/09/2016   Procedure: Left Heart Cath and Coronary Angiography;  Surgeon: Peter M Martinique, MD;  Location: [place] [facility];  Service: Cardiovascular;  Laterality: N/A;  . CARDIAC CATHETERIZATION N/A 07/09/2016   Procedure: Coronary Stent Intervention;  Surgeon: Peter M Martinique, MD;  Location: [place] [facility];  Service: Cardiovascular;  Laterality: N/A;  . CATARACT EXTRACTION Bilateral   . CORONARY STENT INTERVENTION N/A 07/06/2017   Procedure: CORONARY STENT INTERVENTION;  Surgeon: Lorretta Harp, MD;  Location: [place] [facility];  Service: Cardiovascular;  Laterality: N/A;  . CORONARY STENT PLACEMENT  06/2016   DES to mid LAD  . CORONARY/GRAFT ACUTE MI REVASCULARIZATION N/A 07/06/2017   Procedure: Coronary/Graft Acute MI Revascularization;  Surgeon: Lorretta Harp, MD;  Location: [place] [facility];  Service: Cardiovascular;  Laterality: N/A;  . EYE SURGERY    . LEFT HEART CATH AND CORONARY ANGIOGRAPHY  N/A 07/06/2017   Procedure: LEFT HEART CATH AND CORONARY ANGIOGRAPHY;  Surgeon: Lorretta Harp, MD;  Location: [place] [facility];  Service: Cardiovascular;  Laterality: N/A;  . LUNG REMOVAL, PARTIAL Right 2008  . PARTIAL HYSTERECTOMY  1986   irregular periods, ovaries remain    Social History   Socioeconomic History  . Marital status: Widowed    Spouse name: Not on file  . Number of children: 1  . Years of education: Not on file  . Highest education level: Not on file  Occupational History  . Occupation: retired    Comment: insurance  Social Needs  . Financial resource strain: Not on file  . Food insecurity:    Worry: Not on file    Inability: Not on file  . Transportation needs:    Medical: Not on file    Non-medical: Not on file  Tobacco Use  . Smoking status: Former Smoker    Packs/day: 0.50    Years: 42.00    Pack years: 21.00    Types: Cigarettes    Last attempt to quit: 07/27/2002    Years since quitting: 15.5  . Smokeless tobacco: Never Used  Substance and Sexual Activity  . Alcohol use: No    Alcohol/week: 0.0 oz  . Drug use: No  . Sexual activity: Never  Lifestyle  . Physical activity:    Days per week: Not on file    Minutes per session: Not on file  . Stress: Not on file  Relationships  . Social connections:    Talks on phone: Not on file    Gets together: Not on file    Attends religious service: Not on file    Active member of club or organization: Not on file    Attends meetings of clubs or organizations: Not on file    Relationship status: Not on file  . Intimate partner violence:    Fear of current or ex partner: Not on file    Emotionally abused: Not on file    Physically abused: Not on file    Forced sexual activity: Not on file  Other Topics Concern  . Not on file  Social History Narrative   The patient is a widow and has one son and one dog.     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  She  does not drink alcohol. 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   Ed: HS   Activity: no regular exercise   Diet: some water, some fruits/vegetables    Lives in a one story home.     Allergies  Allergen Reactions  . Prednisone Other (See Comments)    Shaking (PILLS) Shot is ok  . Actonel [Risedronate Sodium] Other (See Comments)    Headache  . Amlodipine Other (See Comments)    Pedal edema  . Brilinta [Ticagrelor] Other (See Comments)    Worsening dyspnea, malaise  . Codeine Rash  . Fosamax [  Alendronate Sodium] Other (See Comments)    Unable to tolerate  . Lisinopril Cough  . Pravastatin Other (See Comments)    Constipation.    Family History  Problem Relation Age of Onset  . CAD Mother 67       MI  . Stroke Mother   . Hypertension Mother   . Heart attack Mother   . CAD Father 37  . Stroke Father   . Breast cancer Sister   . Diabetes Sister     Prior to Admission medications   Medication Sig Start Date End Date Taking? Authorizing Provider  albuterol (PROAIR HFA) 108 (90 Base) MCG/ACT inhaler Inhale 2 puffs into the lungs every 4 (four) hours as needed for wheezing or shortness of breath. 09/16/17  Yes Byrum, Rose Fillers, MD  albuterol (PROVENTIL) (2.5 MG/3ML) 0.083% nebulizer solution Take 3 mLs (2.5 mg total) by nebulization every 4 (four) hours as needed for wheezing or shortness of breath. 07/31/16  Yes Collene Gobble, MD  aspirin EC 81 MG tablet Take 1 tablet (81 mg total) by mouth daily. 02/26/16  Yes Arbutus Leas, NP  calcium carbonate (OS-CAL - DOSED IN MG OF ELEMENTAL CALCIUM) 1250 (500 Ca) MG tablet Take 1 tablet by mouth 2 (two) times daily with a meal.   Yes [provider]  clopidogrel (PLAVIX) 75 MG tablet Take 1 tablet (75 mg total) by mouth daily. 07/30/17 07/25/18 Yes Sherren Mocha, MD  denosumab (PROLIA) 60 MG/ML SOLN injection Inject 60 mg into the skin every 6 (six) months. Administer in upper arm, thigh, or abdomen 06/13/15  Yes  Ria Bush, MD  fluticasone [facility]) 50 MCG/ACT nasal spray INSTILL 2 SPRAYS INTO BOTH NOSTRILS ONCEDAILY AS DIRECTED 12/08/17  Yes Byrum, Rose Fillers, MD  furosemide (LASIX) 20 MG tablet Take 1 tablet (20 mg total) by mouth daily. Take an additional 1/2 tablet daily for swelling. 07/09/17 07/04/18 Yes Kroeger, Daleen Snook M., PA-C  isosorbide mononitrate (IMDUR) 30 MG 24 hr tablet TAKE 1 TABLET BY MOUTH DAILY 04/30/17  Yes Sherren Mocha, MD  loratadine (CLARITIN) 10 MG tablet Take 10 mg by mouth daily as needed for allergies or rhinitis.    Yes [provider]  LOTEMAX 0.5 % ophthalmic suspension Place 1 drop into both eyes daily.  12/27/13  Yes [provider]  nitroGLYCERIN (NITROSTAT) 0.4 MG SL tablet Place 1 tablet (0.4 mg total) under the tongue every 5 (five) minutes as needed for chest pain. 07/03/15  Yes Weaver, Scott T, PA-C  pantoprazole (PROTONIX) 40 MG tablet Take 1 tablet (40 mg total) by mouth 2 (two) times daily. 02/17/18  Yes Collene Gobble, MD  prednisoLONE acetate (PRED FORTE) 1 % ophthalmic suspension 1 drop daily. Both eyes   Yes [provider]  simvastatin (ZOCOR) 40 MG tablet TAKE 1 TABLET BY MOUTH AT BEDTIME 10/06/17  Yes Sherren Mocha, MD  Spacer/Aero-Holding Chambers (AEROCHAMBER MV) inhaler Use as instructed 12/28/16  Yes Collene Gobble, MD  spironolactone (ALDACTONE) 25 MG tablet TAKE 1 TABLET BY MOUTH ONCE DAILY 12/29/17  Yes Sherren Mocha, MD  benzonatate (TESSALON) 200 MG capsule Take 1 capsule (200 mg total) by mouth 3 (three) times daily as needed for cough. Patient not taking: Reported on 02/17/2018 12/30/17   Lauraine Rinne, NP  ipratropium-albuterol (DUONEB) 0.5-2.5 (3) MG/3ML SOLN INHALE 1 VIAL VIA NEBULIZER FOUR TIMES ADAY AND EVERY 4 HOURS AS NEEDED AS DIRECTED Patient not taking: Reported on 02/24/2018 10/04/17   Collene Gobble,  MD    Physical Exam: Vitals:   02/24/18 1300 02/24/18 1345 02/24/18 1600 02/24/18 1646  BP: 120/71 127/77 (!)  147/97 130/68  Pulse: 83 (!) 49 66 74  Resp: (!) 22 18    Temp:      TempSrc:      SpO2: 95% 97% 98% 98%  Weight:    56.2 kg (123 lb 14.4 oz)  Height:    5\' 6"  (1.676 m)     General:  Appears calm and comfortable and is NAD Eyes:  PERRL, EOMI, normal lids, iris ENT:  grossly normal hearing, lips & tongue, mmm Neck:  no LAD, masses or thyromegaly Cardiovascular:  RRR, no m/r/g. No LE edema.  Respiratory:   Increased expiratory time with mild diffuse wheezing.  Mildly increased respiratory effort. Abdomen:  soft, NT, ND, NABS Back:   normal alignment, no CVAT Skin:  no rash or induration seen on limited exam Musculoskeletal:  grossly normal tone BUE/BLE, good ROM, no bony abnormality Psychiatric:  grossly normal mood and affect, speech fluent and appropriate, AOx3 Neurologic:  CN 2-12 grossly intact, moves all extremities in coordinated fashion, sensation intact    Radiological Exams on Admission: Dg Chest 2 View  Result Date: 02/24/2018 CLINICAL DATA:  Shortness of breath. EXAM: CHEST - 2 VIEW COMPARISON:  Radiographs of January 20, 2018. PET scan of January 25, 2018. FINDINGS: The heart size and mediastinal contours are within normal limits. Atherosclerosis of thoracic aorta is noted. No pneumothorax or pleural effusion is noted. Left lung is unremarkable. Right basilar density is noted which corresponds to possible malignancy noted on prior PET scan. The visualized skeletal structures are unremarkable. IMPRESSION: Right basilar density is noted which corresponds to possible mass and malignancy noted on prior PET scan. No other significant abnormality seen. Aortic Atherosclerosis (ICD10-I70.0). Electronically Signed   By: Marijo Conception, M.D.   On: 02/24/2018 13:11   Ct Head Wo Contrast  Result Date: 02/24/2018 CLINICAL DATA:  Fall, syncope EXAM: CT HEAD WITHOUT CONTRAST TECHNIQUE: Contiguous axial images were obtained from the base of the skull through the vertex without intravenous  contrast. COMPARISON:  MRI head 04/20/2015 FINDINGS: Brain: Chronic infarct in the right parietal lobe unchanged. Chronic lacunar infarction left basal ganglia unchanged. Chronic microvascular ischemia throughout the white matter. Negative for acute hemorrhage, mass, or acute infarct. Moderate atrophy. Vascular: Negative for hyperdense vessel Skull: Negative Sinuses/Orbits: Mild mucosal edema paranasal sinuses. Bilateral cataract surgery. Other: None IMPRESSION: Atrophy and chronic ischemic change.  No acute abnormality. Electronically Signed   By: Franchot Gallo M.D.   On: 02/24/2018 14:21    EKG: Independently reviewed.  NSR with rate 67; IVCD; nonspecific ST changes with no evidence of acute ischemia   Labs on Admission: I have personally reviewed the available labs and imaging studies at the time of the admission.  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 -Will monitor on telemetry -Orthostatic vital signs in AM -Neuro checks  -PT/OT eval and treat  COPD exacerbation -While patient was not forthcoming with this information, she has had increased SOB with increased cough and change in purulence of sputum recently -Based  on these symptoms and with the situation today, it seems reasonable to treat as COPD exacerbation -Overnight observation -Duonebs with prn albuterol -PO doxycycline -Pulmicort nebs ("allergy" to prednisone - oral only)  Lung CA -Based on recent discussion with Dr. 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 -She has declined biopsy to date  HLD -Continue Zocor  CHF -Appears to be compensated at this time -BNP is mildly improved from 12/18 -Will monitor for s/sx of CHF -Hold IVF at this time  CKD -Appears to be stable -Will follow  Afib -While there is not a clear notation of prior afib, the patient and her niece report h/o afib -She is now back in sinus and rate controlled -She has apparently not been deemed a good candidate for [facility] in the past since she is not on blood thinners at this time  DVT prophylaxis:  Lovenox  Code Status:  DNR - confirmed with patient/family Family Communication: Niece present throughout evaluation Disposition Plan:  Home once clinically improved Consults called: CM/PT/OT/Nutrition/RT Admission status: It is my clinical opinion that referral for OBSERVATION is reasonable and necessary in this patient based on the above information provided. The aforementioned taken together are felt to place the patient at high risk for further clinical deterioration. However it is anticipated that the patient may be medically stable for discharge from the hospital within 24 to 48 hours.   Karmen Bongo MD [facility]  If note is complete, please contact covering daytime or nighttime physician. [email or web address] Password [facility]  02/24/2018, 5:51 PM

## 2018-02-25 ENCOUNTER — Encounter: Payer: Self-pay | Admitting: *Deleted

## 2018-02-25 DIAGNOSIS — Z955 Presence of coronary angioplasty implant and graft: Secondary | ICD-10-CM | POA: Diagnosis not present

## 2018-02-25 DIAGNOSIS — I2510 Atherosclerotic heart disease of native coronary artery without angina pectoris: Secondary | ICD-10-CM | POA: Diagnosis present

## 2018-02-25 DIAGNOSIS — R55 Syncope and collapse: Secondary | ICD-10-CM | POA: Diagnosis present

## 2018-02-25 DIAGNOSIS — J439 Emphysema, unspecified: Secondary | ICD-10-CM | POA: Diagnosis present

## 2018-02-25 DIAGNOSIS — Z85118 Personal history of other malignant neoplasm of bronchus and lung: Secondary | ICD-10-CM | POA: Diagnosis not present

## 2018-02-25 DIAGNOSIS — I252 Old myocardial infarction: Secondary | ICD-10-CM | POA: Diagnosis not present

## 2018-02-25 DIAGNOSIS — J9601 Acute respiratory failure with hypoxia: Secondary | ICD-10-CM | POA: Diagnosis not present

## 2018-02-25 DIAGNOSIS — I493 Ventricular premature depolarization: Secondary | ICD-10-CM | POA: Diagnosis present

## 2018-02-25 DIAGNOSIS — I480 Paroxysmal atrial fibrillation: Secondary | ICD-10-CM | POA: Diagnosis present

## 2018-02-25 DIAGNOSIS — Z681 Body mass index (BMI) 19 or less, adult: Secondary | ICD-10-CM | POA: Diagnosis not present

## 2018-02-25 DIAGNOSIS — N183 Chronic kidney disease, stage 3 (moderate): Secondary | ICD-10-CM | POA: Diagnosis present

## 2018-02-25 DIAGNOSIS — I5042 Chronic combined systolic (congestive) and diastolic (congestive) heart failure: Secondary | ICD-10-CM | POA: Diagnosis present

## 2018-02-25 DIAGNOSIS — Z888 Allergy status to other drugs, medicaments and biological substances status: Secondary | ICD-10-CM | POA: Diagnosis not present

## 2018-02-25 DIAGNOSIS — I472 Ventricular tachycardia: Secondary | ICD-10-CM | POA: Diagnosis present

## 2018-02-25 DIAGNOSIS — I4891 Unspecified atrial fibrillation: Secondary | ICD-10-CM | POA: Diagnosis not present

## 2018-02-25 DIAGNOSIS — M810 Age-related osteoporosis without current pathological fracture: Secondary | ICD-10-CM | POA: Diagnosis present

## 2018-02-25 DIAGNOSIS — I255 Ischemic cardiomyopathy: Secondary | ICD-10-CM | POA: Diagnosis present

## 2018-02-25 DIAGNOSIS — L8990 Pressure ulcer of unspecified site, unspecified stage: Secondary | ICD-10-CM

## 2018-02-25 DIAGNOSIS — J9621 Acute and chronic respiratory failure with hypoxia: Secondary | ICD-10-CM | POA: Diagnosis present

## 2018-02-25 DIAGNOSIS — E440 Moderate protein-calorie malnutrition: Secondary | ICD-10-CM | POA: Diagnosis present

## 2018-02-25 DIAGNOSIS — Z8673 Personal history of transient ischemic attack (TIA), and cerebral infarction without residual deficits: Secondary | ICD-10-CM | POA: Diagnosis not present

## 2018-02-25 DIAGNOSIS — Z66 Do not resuscitate: Secondary | ICD-10-CM | POA: Diagnosis present

## 2018-02-25 DIAGNOSIS — K219 Gastro-esophageal reflux disease without esophagitis: Secondary | ICD-10-CM | POA: Diagnosis present

## 2018-02-25 DIAGNOSIS — Z9981 Dependence on supplemental oxygen: Secondary | ICD-10-CM | POA: Diagnosis not present

## 2018-02-25 DIAGNOSIS — C3431 Malignant neoplasm of lower lobe, right bronchus or lung: Secondary | ICD-10-CM | POA: Diagnosis not present

## 2018-02-25 DIAGNOSIS — I130 Hypertensive heart and chronic kidney disease with heart failure and stage 1 through stage 4 chronic kidney disease, or unspecified chronic kidney disease: Secondary | ICD-10-CM | POA: Diagnosis present

## 2018-02-25 DIAGNOSIS — H3530 Unspecified macular degeneration: Secondary | ICD-10-CM | POA: Diagnosis present

## 2018-02-25 DIAGNOSIS — E785 Hyperlipidemia, unspecified: Secondary | ICD-10-CM | POA: Diagnosis present

## 2018-02-25 LAB — CBC
HCT: 37 % (ref 36.0–46.0)
Hemoglobin: 12.7 g/dL (ref 12.0–15.0)
MCH: 32.8 pg (ref 26.0–34.0)
MCHC: 34.3 g/dL (ref 30.0–36.0)
MCV: 95.6 fL (ref 78.0–100.0)
Platelets: 180 10*3/uL (ref 150–400)
RBC: 3.87 MIL/uL (ref 3.87–5.11)
RDW: 13.7 % (ref 11.5–15.5)
WBC: 7.2 10*3/uL (ref 4.0–10.5)

## 2018-02-25 LAB — BASIC METABOLIC PANEL
Anion gap: 8 (ref 5–15)
BUN: 17 mg/dL (ref 8–23)
CHLORIDE: 102 mmol/L (ref 98–111)
CO2: 28 mmol/L (ref 22–32)
Calcium: 9.1 mg/dL (ref 8.9–10.3)
Creatinine, Ser: 0.95 mg/dL (ref 0.44–1.00)
GFR calc non Af Amer: 56 mL/min — ABNORMAL LOW (ref >60)
Glucose, Bld: 95 mg/dL (ref 70–99)
Potassium: 4 mmol/L (ref 3.5–5.1)
Sodium: 138 mmol/L (ref 135–145)

## 2018-02-25 LAB — URINE CULTURE

## 2018-02-25 LAB — TSH: TSH: 1.918 u[IU]/mL (ref 0.350–4.500)

## 2018-02-25 MED ORDER — BUDESONIDE 0.5 MG/2ML IN SUSP: 2.0000 mg | Freq: Two times a day (BID) | RESPIRATORY_TRACT | Status: DC

## 2018-02-25 MED ORDER — BUDESONIDE 0.25 MG/2ML IN SUSP
0.2500 mg | Freq: Two times a day (BID) | RESPIRATORY_TRACT | Status: DC
  Administered 2018-02-25 – 2018-02-27 (×5): 0.25 mg via RESPIRATORY_TRACT
  Filled 2018-02-25 (×5): qty 2

## 2018-02-25 MED ORDER — IPRATROPIUM-ALBUTEROL 0.5-2.5 (3) MG/3ML IN SOLN
3.0000 mL | Freq: Three times a day (TID) | RESPIRATORY_TRACT | Status: DC
  Administered 2018-02-25 – 2018-02-27 (×7): 3 mL via RESPIRATORY_TRACT
  Filled 2018-02-25 (×7): qty 3

## 2018-02-25 MED ORDER — BOOST PLUS PO LIQD
237.0000 mL | Freq: Three times a day (TID) | ORAL | Status: DC
  Administered 2018-02-25 – 2018-02-26 (×4): 237 mL via ORAL
  Filled 2018-02-25 (×7): qty 237

## 2018-02-25 MED ORDER — METHYLPREDNISOLONE SODIUM SUCC 125 MG IJ SOLR
125.0000 mg | Freq: Once | INTRAMUSCULAR | Status: AC
  Administered 2018-02-25: 125 mg via INTRAVENOUS
  Filled 2018-02-25: qty 2

## 2018-02-25 MED ORDER — METHYLPREDNISOLONE SODIUM SUCC 125 MG IJ SOLR
60.0000 mg | Freq: Three times a day (TID) | INTRAMUSCULAR | Status: DC
  Administered 2018-02-25 – 2018-02-27 (×6): 60 mg via INTRAVENOUS
  Filled 2018-02-25 (×6): qty 2

## 2018-02-25 NOTE — Consult Note (Signed)
   [facility] CM Inpatient Consult   02/25/2018  Brooks Stotz Gaughan [birth year] [id number]    Referral received from inpatient [facility] for [place] [facility] services.   Went to bedside to speak with Mrs. Valdes about [place] Management program. She is agreeable and [place] [facility] written consent obtained. [facility] folder provided.  Agreeable to [place] follow up for COPD education.  Confirmed Primary Care Provider is Dr. Danise Mina with Velora Heckler at [facility] (PCP office listed as doing transition of care call). Confirmed best contact number for Mrs. Brun as (home) [phone] and (cell) [phone]. Mrs. Reading denies having any concerns with medications or transportation.  Her brother lives next door and her niece lives behind her. She has a supportive family.  Will make referral to [place] for COPD.   Made inpatient RNCM aware [facility] will follow post discharge.   Marthenia Rolling, MSN-Ed, RN,BSN [facility] Liaison [phone]

## 2018-02-25 NOTE — Evaluation (Addendum)
Occupational Therapy Evaluation Patient Details Name: [name] MRN: [id number] DOB: 05/25/1941 Today's Date: 02/25/2018    History of Present Illness Pt is a 77 y/o female presenting to ED with syncope after developing acute SOB and falling while walking her dog outside.  PMH signifcant for but not limited to: CVA, CAD, lung CA, HTN, HLD, COPD, stage 3 CKD, chronic combined heart failure.    Clinical Impression   PTA patient was independent with ADLs, IADLs (driving), and reports using rollator as needed for mobility (but not recently).  She reports having supplemental oxygen at home, but only used it "when she needed it during the day".  She currently requires min guard assist for LB ADLs, setup assist for UB ADLs, min guard assist for toilet transfers, and supervision for bed mobility. Educated on safety, mobility, and ADL participation. She is limited by generalized weakness, decreased activity tolerance, impaired balance, and decreased safety awareness.  Based on performance today, recommend 24/7 assistance at discharge but no follow up OT services.  Will continue to follow while admitted in order to address ADLs, energy conservation and safety.       Follow Up Recommendations  No OT follow up;Supervision/Assistance - 24 hour    Equipment Recommendations  None recommended by OT    Recommendations for Other Services       Precautions / Restrictions Precautions Precautions: Fall Restrictions Weight Bearing Restrictions: No      Mobility Bed Mobility Overal bed mobility: Needs Assistance Bed Mobility: Sit to Supine       Sit to supine: Supervision;HOB elevated   General bed mobility comments: supervision for safety, no phyiscal assist required  Transfers Overall transfer level: Needs assistance Equipment used: None Transfers: Sit to/from Stand;Stand Pivot Transfers Sit to Stand: Min guard Stand pivot transfers: Min guard       General transfer comment: increased  effort, min guard for safety     Balance Overall balance assessment: Needs assistance Sitting-balance support: No upper extremity supported;Feet supported Sitting balance-Leahy Scale: Good     Standing balance support: Single extremity supported;During functional activity Standing balance-Leahy Scale: Fair Standing balance comment: reliant on 1 UE support during standing                            ADL either performed or assessed with clinical judgement   ADL Overall ADL's : Needs assistance/impaired Eating/Feeding: Set up;Supervision/ safety;Sitting   Grooming: Set up;Supervision/safety;Sitting   Upper Body Bathing: Set up;Sitting   Lower Body Bathing: Min guard;Sit to/from stand   Upper Body Dressing : Set up;Sitting   Lower Body Dressing: Min guard;Sit to/from stand   Toilet Transfer: Min guard;Stand-pivot(simulated in room)   Toileting- Water quality scientist and Hygiene: Min guard;Sit to/from stand       Functional mobility during ADLs: Min guard General ADL Comments: Patient limited by decreased activity tolerance, oxygen saturations dropping to 88% with limited mobility.      Vision Patient Visual Report: No change from baseline Vision Assessment?: No apparent visual deficits     Perception     Praxis      Pertinent Vitals/Pain Pain Assessment: No/denies pain     Hand Dominance Right   Extremity/Trunk Assessment Upper Extremity Assessment Upper Extremity Assessment: Generalized weakness   Lower Extremity Assessment Lower Extremity Assessment: Defer to PT evaluation       Communication Communication Communication: No difficulties   Cognition Arousal/Alertness: Awake/alert Behavior During Therapy: [facility] for tasks  assessed/performed Overall Cognitive Status: Within Functional Limits for tasks assessed                                     General Comments  Utilized supplemental oxygen via [place] on 2L throughout session.      Exercises     Shoulder Instructions      Home Living Family/patient expects to be discharged to:: Private residence Living Arrangements: Alone Available Help at Discharge: Available PRN/intermittently;Family Type of Home: House Home Access: Stairs to enter [employer] of Steps: 1   Home Layout: One level     Bathroom Shower/Tub: Teacher, early years/pre: Handicapped height     Home Equipment: Shower seat;Grab bars - toilet;Other (comment);Walker - 4 wheels(oxygen (wore PRN during day 2L))          Prior Functioning/Environment Level of Independence: Independent        Comments: intermittently used rollator as needed (not in last 2-3 months), Independent with IADLs, ADLs, and driving         OT Problem List: Decreased strength;Decreased activity tolerance;Impaired balance (sitting and/or standing);Decreased safety awareness;Decreased knowledge of use of DME or AE;Decreased knowledge of precautions      OT Treatment/Interventions: Self-care/ADL training;Therapeutic exercise;DME and/or AE instruction;Therapeutic activities;Balance training;Patient/family education    OT Goals(Current goals can be found in the care plan section) Acute Rehab OT Goals Patient Stated Goal: to get home OT Goal Formulation: With patient Time For Goal Achievement: 03/11/18 Potential to Achieve Goals: Good  OT Frequency: Min 2X/week   Barriers to D/C:            Co-evaluation              AM-PAC PT "6 Clicks" Daily Activity     Outcome Measure Help from another person eating meals?: None Help from another person taking care of personal grooming?: A Little Help from another person toileting, which includes using toliet, bedpan, or urinal?: A Little Help from another person bathing (including washing, rinsing, drying)?: A Little Help from another person to put on and taking off regular upper body clothing?: None Help from another person to put on and taking  off regular lower body clothing?: A Little 6 Click Score: 20   End of Session Equipment Utilized During Treatment: Gait belt;Oxygen Nurse Communication: Mobility status  Activity Tolerance: Patient tolerated treatment well Patient left: in bed;with call bell/phone within reach;with bed alarm set  OT Visit Diagnosis: Other abnormalities of gait and mobility (R26.89);Muscle weakness (generalized) (M62.81);History of falling (Z91.81)                Time: 4158-3094 OT Time Calculation (min): 17 min Charges:  OT General Charges $OT Visit: 1 Visit OT Evaluation $OT Eval Low Complexity: 1 Low  Delight Stare, OTR/L  Pager [place] 02/25/2018, 11:30 AM

## 2018-02-25 NOTE — Progress Notes (Signed)
PT Note Orthostatic BPs  Supine 98/87, 70 bpm  Sitting 130/71, 78 bpm  Standing 144/87, 81 bpm  Standing after 3 min 146/88, 82 bpm  Pt asymptomatic and did not have any drop in BP with orthostatic VS.  Thanks.  [place] [phone] (pager)

## 2018-02-25 NOTE — Progress Notes (Signed)
SATURATION QUALIFICATIONS: (This note is used to comply with regulatory documentation for home oxygen)  Patient Saturations on Room Air at Rest = 85%  Patient Saturations on Room Air while Ambulating = Did not attempt due to desat on RA at rest  Patient Saturations on 4 Liters of oxygen while Ambulating = 88%  Please briefly explain why patient needs home oxygen:Pt sats 81% on 2L with ambulation.  Required 4LO2 with activity to keep sats >88%.  Thanks.  [place] [phone] (pager)

## 2018-02-25 NOTE — Care Management Note (Signed)
Case Management Note  Patient Details  Name: [name] MRN: [id number] Date of Birth: 1941-02-12  Subjective/Objective:  Patient presented from home with syncope, SOB and nausea while walking dog. Patient with PMD: CVA, COPD, stage 3 CKD, HLD, lung CA, HTN and chronic combined HF.  Patient lives at home alone, verbalized having family support, independent with ADLs, occasionally ambulating with a rollator. Patient noncompliant with home O2 use. Patient reports only utilizing home O2 @ 2LPM at night, instead of continuous O2 usage as ordered by her Pulmonologist. PCP: Dr. Micah Flesher; Local Pharmacy: [facility], [place], [place].          Action/Plan: CM assessed patient for hospital transitional needs and PT/OT recommendations. Patient agreeable to HHRN/PT with [facility] patient's agency of choice, with [facility] liaison appraised of the referral. [facility] referral made in regards for home O2 needs, with Health Coach to assess compliance and call in the home. Staff RN assessed increased need for liter flow, patient will require an additional O2 order with increased liter flow. Patient indicated her family will provide transportation at discharge. CM will continue to follow for hospital transitional needs.   Expected Discharge Date:                  Expected Discharge Plan:  [place]  In-House Referral:   [facility]  Discharge planning Services  CM Consult  Post Acute Care Choice:  Home Health Choice offered to:  Patient  DME Arranged:  Oxygen DME Agency:  [place] Arranged:  RN, PT, Disease Management [place] Agency:  [place]  Status of Service:  Completed, signed off  If discussed at [place] of Stay Meetings, dates discussed:    Additional Comments:  Georgeanna Lea, RN 02/25/2018, 3:03 PM

## 2018-02-25 NOTE — Evaluation (Signed)
Physical Therapy Evaluation Patient Details Name: [name] MRN: [id number] DOB: [birth year] Today's Date: 02/25/2018   History of Present Illness  Pt is a 77 y/o female presenting to ED with syncope after developing acute SOB and falling while walking her dog outside.  PMH signifcant for but not limited to: CVA, CAD, lung CA, HTN, HLD, COPD, stage 3 CKD, chronic combined heart failure.   Clinical Impression  Pt admitted with above diagnosis. Pt currently with functional limitations due to the deficits listed below (see PT Problem List). Pt was able to ambulate with min guard assist with RW.  Desat on 2L with activity needing 4L to keep O2 sats >88%.  Pt also was not orthostatic (see separate note for orthostatic BPs).   Will foll ow acutely . Pt will benefit from skilled PT to increase their independence and safety with mobility to allow discharge to the venue listed below.      Follow Up Recommendations Home health PT;Supervision/Assistance - 24 hour    Equipment Recommendations  None recommended by PT    Recommendations for Other Services       Precautions / Restrictions Precautions Precautions: Fall Restrictions Weight Bearing Restrictions: No      Mobility  Bed Mobility Overal bed mobility: Needs Assistance Bed Mobility: Sit to Supine       Sit to supine: Supervision;HOB elevated   General bed mobility comments: supervision for safety, no phyiscal assist required  Transfers Overall transfer level: Needs assistance Equipment used: None Transfers: Sit to/from Stand;Stand Pivot Transfers Sit to Stand: Min guard Stand pivot transfers: Min guard       General transfer comment: increased effort, min guard for safety   Ambulation/Gait Ambulation/Gait assistance: Min guard Gait Distance (Feet): 150 Feet Assistive device: Rolling walker (2 wheeled) Gait Pattern/deviations: Step-through pattern;Decreased stride length   Gait velocity interpretation: <1.31 ft/sec,  indicative of household ambulator General Gait Details: Pt was able to ambulate with good safety with rW. Desats on 2L O2.  Needed 4L to keep O2 sats >88% - see O2 sat note.   Stairs            Wheelchair Mobility    Modified Rankin (Stroke Patients Only)       Balance Overall balance assessment: Needs assistance Sitting-balance support: No upper extremity supported;Feet supported Sitting balance-Leahy Scale: Good     Standing balance support: Single extremity supported;During functional activity Standing balance-Leahy Scale: Fair Standing balance comment: reliant on 1 UE support during standing                              Pertinent Vitals/Pain Pain Assessment: No/denies pain    Home Living Family/patient expects to be discharged to:: Private residence Living Arrangements: Alone Available Help at Discharge: Available PRN/intermittently;Family(brother and niece live close by and check on daily) Type of Home: House Home Access: Stairs to enter Entrance Stairs-Rails: Right;Left;Can reach both Entrance Stairs-Number of Steps: 7 Home Layout: One level Home Equipment: Shower seat;Grab bars - toilet;Other (comment);Walker - 4 wheels;Bedside commode(oxygen (wore PRN during day 2L))      Prior Function Level of Independence: Independent         Comments: intermittently used rollator as needed (not in last 2-3 months), Independent with IADLs, ADLs, and driving      Hand Dominance   Dominant Hand: Right    Extremity/Trunk Assessment   Upper Extremity Assessment Upper Extremity Assessment: Defer to OT evaluation  Lower Extremity Assessment Lower Extremity Assessment: Generalized weakness    Cervical / Trunk Assessment Cervical / Trunk Assessment: Normal  Communication   Communication: No difficulties  Cognition Arousal/Alertness: Awake/alert Behavior During Therapy: WFL for tasks assessed/performed Overall Cognitive Status: Within Functional  Limits for tasks assessed                                        General Comments      Exercises     Assessment/Plan    PT Assessment Patient needs continued PT services  PT Problem List Decreased activity tolerance;Decreased balance;Decreased mobility;Decreased knowledge of use of DME;Decreased safety awareness;Decreased knowledge of precautions;Cardiopulmonary status limiting activity       PT Treatment Interventions DME instruction;Gait training;Functional mobility training;Therapeutic activities;Therapeutic exercise;Balance training;Patient/family education;Stair training    PT Goals (Current goals can be found in the Care Plan section)  Acute Rehab PT Goals Patient Stated Goal: to get home PT Goal Formulation: With patient Time For Goal Achievement: 03/11/18 Potential to Achieve Goals: Good    Frequency Min 3X/week   Barriers to discharge Decreased caregiver support      Co-evaluation               AM-PAC PT "6 Clicks" Daily Activity  Outcome Measure Difficulty turning over in bed (including adjusting bedclothes, sheets and blankets)?: None Difficulty moving from lying on back to sitting on the side of the bed? : None Difficulty sitting down on and standing up from a chair with arms (e.g., wheelchair, bedside commode, etc,.)?: None Help needed moving to and from a bed to chair (including a wheelchair)?: A Little Help needed walking in hospital room?: A Little Help needed climbing 3-5 steps with a railing? : A Little 6 Click Score: 21    End of Session Equipment Utilized During Treatment: Gait belt;Oxygen Activity Tolerance: Patient limited by fatigue Patient left: in chair;with call bell/phone within reach;with chair alarm set Nurse Communication: Mobility status PT Visit Diagnosis: Unsteadiness on feet (R26.81);Muscle weakness (generalized) (M62.81)    Time: 4920-1007 PT Time Calculation (min) (ACUTE ONLY): 27 min   Charges:   PT  Evaluation $PT Eval Moderate Complexity: 1 Mod PT Treatments $Gait Training: 8-22 mins        [place] [phone] (pager)   Denice Paradise 02/25/2018, 1:42 PM

## 2018-02-25 NOTE — Progress Notes (Signed)
Initial Nutrition Assessment  DOCUMENTATION CODES:   Non-severe (moderate) malnutrition in context of chronic illness  INTERVENTION:    Boost Plus PO TID, each supplement provides 360 kcal and 14 gm protein  NUTRITION DIAGNOSIS:   Moderate Malnutrition related to chronic illness(COPD, CAD, HF, CKD) as evidenced by mild fat depletion, mild muscle depletion.  GOAL:   Patient will meet greater than or equal to 90% of their needs  MONITOR:   PO intake, Supplement acceptance  REASON FOR ASSESSMENT:   Consult COPD Protocol  ASSESSMENT:   77 yo female with PMH of HTN, lung cancer, CVA, COPD, GERD, CAD, HLD, HF, CKD, DM who was admitted on 8/1 with A fib with RVR, syncope.  Patient reports that she has a good appetite now and at home. She likes to drink chocolate Boost supplements, does not like Ensure. Her weight fluctuates with fluids, she thinks.   Labs reviewed. Medications reviewed and include Lasix, Solu-medrol, spironolactone.   Per review of weight encounters, weight has been stable between 122 and 126 lbs for the past 3-4 months.   NUTRITION - FOCUSED PHYSICAL EXAM:    Most Recent Value  Orbital Region  Mild depletion  Upper Arm Region  Mild depletion  Thoracic and Lumbar Region  Unable to assess  Buccal Region  No depletion  Temple Region  Mild depletion  Clavicle Bone Region  Moderate depletion  Clavicle and Acromion Bone Region  Mild depletion  Scapular Bone Region  Mild depletion  Dorsal Hand  Moderate depletion  Patellar Region  Unable to assess  Anterior Thigh Region  Unable to assess  Posterior Calf Region  No depletion  Edema (RD Assessment)  None  Hair  Reviewed  Eyes  Reviewed  Mouth  Reviewed  Skin  Reviewed  Nails  Reviewed       Diet Order:   Diet Order           Diet Heart Room service appropriate? Yes; Fluid consistency: Thin  Diet effective now          EDUCATION NEEDS:   No education needs have been identified at this  time  Skin:  Skin Assessment: Skin Integrity Issues: Skin Integrity Issues:: Stage I Stage I: sacrum  Last BM:  7/31  Height:   Ht Readings from Last 1 Encounters:  02/24/18 5\' 6"  (1.676 m)    Weight:   Wt Readings from Last 1 Encounters:  02/25/18 123 lb 4.8 oz (55.9 kg)    Ideal Body Weight:  59.1 kg  BMI:  Body mass index is 19.9 kg/m.  Estimated Nutritional Needs:   Kcal:  1500-1700  Protein:  70-80 gm  Fluid:  1.5 L    Molli Barrows, RD, LDN, [place] Pager [phone] After Hours Pager [phone]

## 2018-02-25 NOTE — Progress Notes (Signed)
[facility]                                                                              Patient Demographics  [name], is a 77 y.o. female, DOB - Nov 11, 1940, VXB:[id number]  Admit date - 02/24/2018   Admitting Physician Karmen Bongo, MD  Outpatient Primary MD for the patient is Ria Bush, MD  Outpatient specialists:   LOS - 0  days   Medical records reviewed and are as summarized below:    Chief Complaint  Patient presents with  . Atrial Fibrillation  . Loss of Consciousness       Brief summary   Patient is a 77 year old female with history of CVA; CAD; lung CA; HTN; HLD;  COPD; stage 3 CKD; and chronic combined heart failure, presented with syncopal episode.  Patient reported that she took her dog walking and then developed acute shortness of breath, grabbed a close line and does not remember anything until she woke up in the grass.  Per patient she uses oxygen at home at night and occasionally during the day but was not wearing O2 during the episode.  She also felt intermittent lightheadedness, coughing, wheezing at the time of the event.  Per EMS patient had A. fib with RVR and was given metoprolol  Assessment & Plan    Principal Problem:   Syncope: Possibly due to vasovagal episode or hypoxia due to lack of home O2, was in mild RVR to 140s when EMS found her -No chest pain, troponin  0.01, normal sinus rhythm -Somewhat orthostatic at the time of admission -Currently feels better, will obtain PT OT evaluation, home O2 evaluation  Active Problems: Acute on chronic respiratory failure with hypoxia likely due to mild COPD exacerbation -At the time of my examination today, patient has mild wheezing with some shortness of breath worsens during conversation.   -Per patient she is not able to tolerate oral prednisone, will place on IV Solu-Medrol until discharge.  Patient tolerated Pulmicort, placed on Pulmicort 0.25 mg every 12 hours -Continue  doxycycline, scheduled duo nebs - Home O2 evaluation   Multiple lung nodules, concerning for malignancy, lung CA -Recent PET scan on oh 7/2 showed left lower lobe nodule essentially stable but increased in metabolic activity favoring malignancy, right lower lobe mass slightly larger from the previous imaging -Patient follows Dr. Lamonte Sakai and has been in discussions regarding biopsy of the masses, she will follow-up with Dr. Lamonte Sakai outpatient next week    HLD (hyperlipidemia) -Continue Zocor    Chronic combined systolic and diastolic heart failure (HCC) -Currently stable, appears to be compensated  CKD stage II -Creatinine currently at baseline, 0.9  Paroxysmal atrial fibrillation -Currently normal sinus rhythm, in the past has not been deemed a good candidate for anticoagulation.    Code Status: DNR DVT Prophylaxis:  Lovenox  Family Communication: Discussed in detail with the patient, all imaging results, lab results explained to the patient    Disposition Plan: Likely DC home in a.m. if improving  Time Spent in minutes   25 minutes  Procedures:  None  Consultants:   None  Antimicrobials:   Doxycycline  8/1   Medications  Scheduled Meds: . aspirin EC  81 mg Oral Daily  . budesonide (PULMICORT) nebulizer solution  0.25 mg Nebulization BID  . clopidogrel  75 mg Oral Daily  . doxycycline  100 mg Oral Q12H  . enoxaparin (LOVENOX) injection  40 mg Subcutaneous Q24H  . fluticasone  2 spray Each Nare Daily  . furosemide  20 mg Oral Daily  . ipratropium-albuterol  3 mL Nebulization TID  . isosorbide mononitrate  30 mg Oral Daily  . loteprednol  1 drop Both Eyes Daily  . methylPREDNISolone (SOLU-MEDROL) injection  60 mg Intravenous Q8H  . pantoprazole  40 mg Oral BID  . prednisoLONE acetate  1 drop Both Eyes Daily  . simvastatin  40 mg Oral QHS  . sodium chloride flush  3 mL Intravenous Q12H  . sodium chloride flush  3 mL Intravenous Q12H  . spironolactone  25 mg Oral  Daily   Continuous Infusions: PRN Meds:.acetaminophen **OR** acetaminophen, albuterol, ondansetron **OR** ondansetron (ZOFRAN) IV   Antibiotics   Anti-infectives (From admission, onward)   Start     Dose/Rate Route Frequency Ordered Stop   02/24/18 2200  doxycycline (VIBRA-TABS) tablet 100 mg     100 mg Oral Every 12 hours 02/24/18 1733 03/01/18 2159        Subjective:   [name] was seen and examined today.  Feeling somewhat better from yesterday, still has some shortness of breath and mild wheezing.  Feels overall weak, lives alone.  No dizziness or lightheadedness.  No chest pain, abdominal pain, N/V/D/C, new weakness, numbess, tingling. No acute events overnight.    Objective:   Vitals:   02/25/18 0544 02/25/18 0547 02/25/18 0830 02/25/18 0849  BP: 103/61 110/63    Pulse: 63 62    Resp:  20    Temp:      TempSrc:      SpO2: 97% 98% 96% 97%  Weight:      Height:        Intake/Output Summary (Last 24 hours) at 02/25/2018 1040 Last data filed at 02/25/2018 0600 Gross per 24 hour  Intake -  Output 600 ml  Net -600 ml     Wt Readings from Last 3 Encounters:  02/25/18 55.9 kg (123 lb 4.8 oz)  02/17/18 57.3 kg (126 lb 6.4 oz)  01/20/18 57.2 kg (126 lb)     Exam  General: Alert and oriented x 3, NAD  Eyes:   HEENT:  Atraumatic, normocephalic  Cardiovascular: S1 S2 auscultated, Regular rate and rhythm.  Respiratory: dec BS throughout with mild wheezing  Gastrointestinal: Soft, nontender, nondistended, + bowel sounds  Ext: no pedal edema bilaterally  Neuro: no new deficits  Musculoskeletal: No digital cyanosis, clubbing  Skin: No rashes  Psych: Normal affect and demeanor, alert and oriented x3    Data Reviewed:  I have personally reviewed following labs and imaging studies  Micro Results No results found for this or any previous visit (from the past 240 hour(s)).  Radiology Reports Dg Chest 2 View  Result Date: 02/24/2018 CLINICAL DATA:   Shortness of breath. EXAM: CHEST - 2 VIEW COMPARISON:  Radiographs of January 20, 2018. PET scan of January 25, 2018. FINDINGS: The heart size and mediastinal contours are within normal limits. Atherosclerosis of thoracic aorta is noted. No pneumothorax or pleural effusion is noted. Left lung is unremarkable. Right basilar density is noted which corresponds to possible malignancy noted on prior PET scan. The visualized skeletal structures are unremarkable.  IMPRESSION: Right basilar density is noted which corresponds to possible mass and malignancy noted on prior PET scan. No other significant abnormality seen. Aortic Atherosclerosis (ICD10-I70.0). Electronically Signed   By: Marijo Conception, M.D.   On: 02/24/2018 13:11   Ct Head Wo Contrast  Result Date: 02/24/2018 CLINICAL DATA:  Fall, syncope EXAM: CT HEAD WITHOUT CONTRAST TECHNIQUE: Contiguous axial images were obtained from the base of the skull through the vertex without intravenous contrast. COMPARISON:  MRI head 04/20/2015 FINDINGS: Brain: Chronic infarct in the right parietal lobe unchanged. Chronic lacunar infarction left basal ganglia unchanged. Chronic microvascular ischemia throughout the white matter. Negative for acute hemorrhage, mass, or acute infarct. Moderate atrophy. Vascular: Negative for hyperdense vessel Skull: Negative Sinuses/Orbits: Mild mucosal edema paranasal sinuses. Bilateral cataract surgery. Other: None IMPRESSION: Atrophy and chronic ischemic change.  No acute abnormality. Electronically Signed   By: Franchot Gallo M.D.   On: 02/24/2018 14:21   Dg Esophagus  Result Date: 01/28/2018 CLINICAL DATA:  Dysphagia EXAM: ESOPHOGRAM / BARIUM SWALLOW / BARIUM TABLET STUDY TECHNIQUE: Combined double contrast and single contrast examination performed using effervescent crystals, thick barium liquid, and thin barium liquid. The patient was observed with fluoroscopy swallowing a 13 mm barium sulphate tablet. FLUOROSCOPY TIME:  Fluoroscopy Time:  0.9  minutes Radiation Exposure Index (if provided by the fluoroscopic device): 3.5 mGy Number of Acquired Spot Images: 0 COMPARISON:  None. FINDINGS: There was normal pharyngeal anatomy and motility. Contrast flowed freely through the esophagus without evidence of stricture or mass. There was normal esophageal mucosa without evidence of irregularity or ulceration. Tertiary contractions of the distal half of the esophagus as can be seen with esophageal spasm versus presbyesophagus. No evidence of reflux. No definite hiatal hernia was demonstrated. At the end of the examination a 13 mm barium tablet was administered which transited through the esophagus and esophagogastric junction without delay. IMPRESSION: 1. Tertiary contractions of the distal half of the esophagus as can be seen with esophageal spasm versus presbyesophagus Electronically Signed   By: Kathreen Devoid   On: 01/28/2018 09:45    Lab Data:  CBC: Recent Labs  Lab 02/24/18 1147 02/25/18 0332  WBC 10.2 7.2  HGB 13.1 12.7  HCT 38.7 37.0  MCV 95.6 95.6  PLT 213 008   Basic Metabolic Panel: Recent Labs  Lab 02/24/18 1147 02/25/18 0332  NA 138 138  K 4.6 4.0  CL 102 102  CO2 28 28  GLUCOSE 106* 95  BUN 19 17  CREATININE 0.91 0.95  CALCIUM 9.5 9.1   GFR: Estimated Creatinine Clearance: 43.8 mL/min (by C-G formula based on SCr of 0.95 mg/dL). Liver Function Tests: No results for input(s): AST, ALT, ALKPHOS, BILITOT, PROT, ALBUMIN in the last 168 hours. No results for input(s): LIPASE, AMYLASE in the last 168 hours. No results for input(s): AMMONIA in the last 168 hours. Coagulation Profile: No results for input(s): INR, PROTIME in the last 168 hours. Cardiac Enzymes: No results for input(s): CKTOTAL, CKMB, CKMBINDEX, TROPONINI in the last 168 hours. BNP (last 3 results) No results for input(s): PROBNP in the last 8760 hours. HbA1C: No results for input(s): HGBA1C in the last 72 hours. CBG: Recent Labs  Lab 02/24/18 1154    GLUCAP 108*   Lipid Profile: No results for input(s): CHOL, HDL, LDLCALC, TRIG, CHOLHDL, LDLDIRECT in the last 72 hours. Thyroid Function Tests: Recent Labs    02/25/18 0332  TSH 1.918   Anemia Panel: No results for input(s): VITAMINB12, FOLATE,  FERRITIN, TIBC, IRON, RETICCTPCT in the last 72 hours. Urine analysis:    Component Value Date/Time   COLORURINE COLORLESS (A) 02/24/2018 1335   APPEARANCEUR CLEAR 02/24/2018 1335   LABSPEC 1.005 02/24/2018 1335   PHURINE 6.0 02/24/2018 1335   GLUCOSEU NEGATIVE 02/24/2018 1335   HGBUR NEGATIVE 02/24/2018 1335   BILIRUBINUR NEGATIVE 02/24/2018 1335   KETONESUR NEGATIVE 02/24/2018 1335   PROTEINUR NEGATIVE 02/24/2018 1335   UROBILINOGEN 0.2 05/05/2009 0353   NITRITE NEGATIVE 02/24/2018 1335   LEUKOCYTESUR NEGATIVE 02/24/2018 1335     Demarcus Thielke M.D. [facility] 02/25/2018, 10:40 AM  Pager: [phone] Between 7am to 7pm - call Pager - [phone]  After 7pm go to [email or web address] - password [email or web address]  Call night coverage person covering after 7pm

## 2018-02-25 NOTE — Progress Notes (Addendum)
Pt c/o of dizzy spell. RN looked at recent saved rhythms and found short runs that are questionable (some more than others) for NSVTach that coincided with her dizzy spells. Longest run was 9 beats. Will continue to monitor.

## 2018-02-26 DIAGNOSIS — I493 Ventricular premature depolarization: Secondary | ICD-10-CM

## 2018-02-26 DIAGNOSIS — E785 Hyperlipidemia, unspecified: Secondary | ICD-10-CM

## 2018-02-26 DIAGNOSIS — J9601 Acute respiratory failure with hypoxia: Secondary | ICD-10-CM

## 2018-02-26 DIAGNOSIS — Z955 Presence of coronary angioplasty implant and graft: Secondary | ICD-10-CM

## 2018-02-26 DIAGNOSIS — I4891 Unspecified atrial fibrillation: Secondary | ICD-10-CM

## 2018-02-26 DIAGNOSIS — C3431 Malignant neoplasm of lower lobe, right bronchus or lung: Secondary | ICD-10-CM

## 2018-02-26 DIAGNOSIS — I25118 Atherosclerotic heart disease of native coronary artery with other forms of angina pectoris: Secondary | ICD-10-CM

## 2018-02-26 DIAGNOSIS — I519 Heart disease, unspecified: Secondary | ICD-10-CM

## 2018-02-26 DIAGNOSIS — I480 Paroxysmal atrial fibrillation: Secondary | ICD-10-CM

## 2018-02-26 DIAGNOSIS — R55 Syncope and collapse: Principal | ICD-10-CM

## 2018-02-26 MED ORDER — MAGNESIUM SULFATE 4 GM/100ML IV SOLN
4.0000 g | Freq: Once | INTRAVENOUS | Status: AC
  Administered 2018-02-26: 4 g via INTRAVENOUS
  Filled 2018-02-26: qty 100

## 2018-02-26 MED ORDER — METOPROLOL TARTRATE 5 MG/5ML IV SOLN
5.0000 mg | Freq: Once | INTRAVENOUS | Status: AC
  Administered 2018-02-26: 5 mg via INTRAVENOUS

## 2018-02-26 MED ORDER — METOPROLOL TARTRATE 5 MG/5ML IV SOLN
INTRAVENOUS | Status: AC
  Filled 2018-02-26: qty 5

## 2018-02-26 NOTE — Progress Notes (Signed)
PROGRESS NOTE    [name]  XLK:[id number] DOB: [birth year] DOA: 02/24/2018 PCP: Ria Bush, MD   77 year old female with history of CVA; CAD; lung CA; HTN; HLD; COPD; stage 3 CKD; and chronic combined heart failure, presented with syncopal episode.  Patient reported that she took her dog walking and then developed acute shortness of breath, grabbed a close line and does not remember anything until she woke up in the grass.  Per patient she uses oxygen at home at night and occasionally during the day but was not wearing O2 during the episode.  She also felt intermittent lightheadedness, coughing, wheezing at the time of the event.  Per EMS patient had A. 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  Patient was also in A. fib and RVR at the time of admission to the hospital by the EMS report.  Here potassium is normal TSH is normal I have given a dose of magnesium and a dose of Lopressor 5 mg.  Cardiology consult is pending.  Active Problems: Acute on chronic respiratory failure with hypoxia likely due to mild COPD exacerbation continue IV Solu-Medrol since she cannot tolerate oral prednisone.  I do not hear much wheezing today.  Continue Pulmicort   Multiple lung nodules, concerning for malignancy, lung CA -Recent PET scan on  oh 7/2 showed left lower lobe nodule essentially stable but increased in metabolic activity favoring malignancy, right lower lobe mass slightly larger from the previous imaging -Patient follows Dr. Lamonte Sakai and has been in discussions regarding biopsy of the masses, she will follow-up with Dr. Lamonte Sakai outpatient next week    HLD (hyperlipidemia) -Continue Zocor    Chronic combined systolic and diastolic heart failure (HCC) -Currently stable, appears to be compensated  CKD stage II -Creatinine currently at baseline, 0.9  Paroxysmal atrial fibrillation -Currently normal sinus rhythm, in the past has not been deemed a good candidate for anticoagulation.          DVT prophylaxis: Lovenox Code Status: DNR Family Communication: None Disposition Plan: TBD Consultants:  Cardiology consult pending for today Procedures: None Antimicrobials none  Subjective: Complains of lightheadedness and dizziness and pain in the back of her neck when heart rate increases. Denies shortness of breath nausea vomiting chest pain cough.  She has been having palpitations and PVCs this morning with a wide-complex tachycardia symptomatic.  She was given Lopressor 5 mg x 1 dose.  The  Objective: Vitals:   02/26/18 0528 02/26/18 0729 02/26/18 0734 02/26/18 0900  BP: 127/68   113/63  Pulse: 84     Resp: (!) 22     Temp: 97.7 F (36.5 C)     TempSrc: Oral     SpO2: 95% 99% 99%   Weight: 56.9 kg (125 lb 6.4 oz)  Height:        Intake/Output Summary (Last 24 hours) at 02/26/2018 1056 Last data filed at 02/25/2018 1500 Gross per 24 hour  Intake -  Output 400 ml  Net -400 ml   Filed Weights   02/24/18 1646 02/25/18 0536 02/26/18 0528  Weight: 56.2 kg (123 lb 14.4 oz) 55.9 kg (123 lb 4.8 oz) 56.9 kg (125 lb 6.4 oz)    Examination: Awake alert answers all my questions appropriately trying to eat breakfast.  General exam: Appears calm and comfortable  Respiratory system: Few scattered wheezing to  auscultation. Respiratory effort normal. Cardiovascular system: S1 & S2 heard, RRR. No JVD, murmurs, rubs, gallops or clicks. No pedal edema. Gastrointestinal system: Abdomen is nondistended, soft and nontender. No organomegaly or masses felt. Normal bowel sounds heard. Central nervous system: Alert and oriented. No focal neurological deficits. Extremities: Symmetric 5 x 5 power. Skin: No rashes, lesions or ulcers Psychiatry: Judgement and insight appear normal. Mood & affect appropriate.     Data Reviewed: I have personally reviewed following labs and imaging studies  CBC: Recent Labs  Lab 02/24/18 1147 02/25/18 0332  WBC 10.2 7.2  HGB 13.1 12.7  HCT 38.7 37.0  MCV 95.6 95.6  PLT 213 924   Basic Metabolic Panel: Recent Labs  Lab 02/24/18 1147 02/25/18 0332  NA 138 138  K 4.6 4.0  CL 102 102  CO2 28 28  GLUCOSE 106* 95  BUN 19 17  CREATININE 0.91 0.95  CALCIUM 9.5 9.1   GFR: Estimated Creatinine Clearance: 44.5 mL/min (by C-G formula based on SCr of 0.95 mg/dL). Liver Function Tests: No results for input(s): AST, ALT, ALKPHOS, BILITOT, PROT, ALBUMIN in the last 168 hours. No results for input(s): LIPASE, AMYLASE in the last 168 hours. No results for input(s): AMMONIA in the last 168 hours. Coagulation Profile: No results for input(s): INR, PROTIME in the last 168 hours. Cardiac Enzymes: No results for input(s): CKTOTAL, CKMB, CKMBINDEX, TROPONINI in the last 168 hours. BNP (last 3 results) No results for input(s): PROBNP in the last 8760 hours. HbA1C: No results for input(s): HGBA1C in the last 72 hours. CBG: Recent Labs  Lab 02/24/18 1154  GLUCAP 108*   Lipid Profile: No results for input(s): CHOL, HDL, LDLCALC, TRIG, CHOLHDL, LDLDIRECT in the last 72 hours. Thyroid Function Tests: Recent Labs    02/25/18 0332  TSH 1.918   Anemia Panel: No results for input(s): VITAMINB12, FOLATE, FERRITIN, TIBC, IRON, RETICCTPCT in the last 72 hours. Sepsis  Labs: No results for input(s): PROCALCITON, LATICACIDVEN in the last 168 hours.  Recent Results (from the past 240 hour(s))  Urine culture     Status: Abnormal   Collection Time: 02/24/18  1:35 PM  Result Value Ref Range Status   Specimen Description URINE, RANDOM  Final   Special Requests NONE  Final   Culture (A)  Final    <10,000 COLONIES/mL INSIGNIFICANT GROWTH Performed at [facility], [address] [place], [place] [address]    Report Status 02/25/2018 FINAL  Final         Radiology Studies: Dg Chest 2 View  Result Date: 02/24/2018 CLINICAL DATA:  Shortness of breath. EXAM: CHEST - 2 VIEW COMPARISON:  Radiographs of January 20, 2018. PET scan of January 25, 2018. FINDINGS: The heart size and mediastinal contours are within normal limits. Atherosclerosis of thoracic aorta is noted. No pneumothorax or pleural effusion is noted. Left lung is unremarkable. Right basilar density is noted which  corresponds to possible malignancy noted on prior PET scan. The visualized skeletal structures are unremarkable. IMPRESSION: Right basilar density is noted which corresponds to possible mass and malignancy noted on prior PET scan. No other significant abnormality seen. Aortic Atherosclerosis (ICD10-I70.0). Electronically Signed   By: Marijo Conception, M.D.   On: 02/24/2018 13:11   Ct Head Wo Contrast  Result Date: 02/24/2018 CLINICAL DATA:  Fall, syncope EXAM: CT HEAD WITHOUT CONTRAST TECHNIQUE: Contiguous axial images were obtained from the base of the skull through the vertex without intravenous contrast. COMPARISON:  MRI head 04/20/2015 FINDINGS: Brain: Chronic infarct in the right parietal lobe unchanged. Chronic lacunar infarction left basal ganglia unchanged. Chronic microvascular ischemia throughout the white matter. Negative for acute hemorrhage, mass, or acute infarct. Moderate atrophy. Vascular: Negative for hyperdense vessel Skull: Negative Sinuses/Orbits: Mild mucosal edema paranasal  sinuses. Bilateral cataract surgery. Other: None IMPRESSION: Atrophy and chronic ischemic change.  No acute abnormality. Electronically Signed   By: Franchot [name] M.D.   On: 02/24/2018 14:21        Scheduled Meds: . aspirin EC  81 mg Oral Daily  . budesonide (PULMICORT) nebulizer solution  0.25 mg Nebulization BID  . clopidogrel  75 mg Oral Daily  . doxycycline  100 mg Oral Q12H  . enoxaparin (LOVENOX) injection  40 mg Subcutaneous Q24H  . fluticasone  2 spray Each Nare Daily  . furosemide  20 mg Oral Daily  . ipratropium-albuterol  3 mL Nebulization TID  . isosorbide mononitrate  30 mg Oral Daily  . lactose free nutrition  237 mL Oral TID WC  . loteprednol  1 drop Both Eyes Daily  . methylPREDNISolone (SOLU-MEDROL) injection  60 mg Intravenous Q8H  . pantoprazole  40 mg Oral BID  . prednisoLONE acetate  1 drop Both Eyes Daily  . simvastatin  40 mg Oral QHS  . sodium chloride flush  3 mL Intravenous Q12H  . sodium chloride flush  3 mL Intravenous Q12H  . spironolactone  25 mg Oral Daily   Continuous Infusions:   LOS: 1 day     Georgette Shell, MD [facility]  If 7PM-7AM, please contact night-coverage [email or web address] Password [email or web address] 02/26/2018, 10:56 AM

## 2018-02-26 NOTE — Consult Note (Addendum)
Cardiology Consultation:   Patient ID: [name]; [id number]; Jan 16, 1941   Admit date: 02/24/2018 Date of Consult: 02/26/2018  Primary Care Provider: Ria Bush, MD Primary Cardiologist: Sherren Mocha, MD  Primary Electrophysiologist:  Dr.Camnitz   Patient Profile:   [name] is a 77 y.o. 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 Zigmund Daniel, Internal Medicine.   History of Present Illness:   [name] has a complex history.  She initially presented with an acute MI in 2010 treated with bare-metal stenting of the left circumflex.  She has had recurrent non-STEMI in 2017 when she was found to have severe stenosis in the mid LAD treated with a drug-eluting stent.  She presented again 06/2017 with an inferior STEMI and was found to have total occlusion of the distal right PDA.  Attempts were made at aspiration and balloon angioplasty but flow was never able to be restored into the most distal part of the vessel.  There was no high-grade proximal stenosis identified.  She has had other problems including dizziness and bradycardia.  She has frequent PVCs.  She has chronic shortness of breath with a history of lung cancer and COPD. She is intolerant to Brilinta. This was discontinued in January and she was changed to Plavix. She has also been unable to tolerate beta blockers. She was on Coreg previously and this was discontinued. Also intolerant to Metoprolol. She is also followed by Dr. Curt Bears for PVCs. Due to high burden and inability to tolerate beta blockers, he recommended she start mexiletine however pt noncompliant. She also had side effects and discontinued after 1  week of therapy. It is mentioned in telephone encounters that Dr. Burt Knack advised avoidance of amiodarone due to her lung issues.   Also of note, pt had a recent PET scan on 7/2 which showed left lower lobe nodule essentially stable but increased in metabolic activity favoring malignancy, right lower lobe mass slightly larger from the previous imaging. This is being followed by Dr. Lamonte Sakai.   Pt now is being evaluated for syncope. She was out walking her dog yesterday morning and suddenly became short of breath, then had syncope and collapse and woke up in the grass with her dog licking her face. Unwitnessed event. She denies any associated CP or palpitations. She was just a few steps from her door and was able to crawl in and call EMS.   Per H&P, EMS noted afib w/ RVR enroute to the ED. I do not see any telemetry strips documenting this and I do not see any prior records in either Dr. Antionette Char nor Dr. Macky Lower notes documenting a h/o afib. Only PVCs.   In ED, CT of head negative. CBG normal at 108. POC troponin negative. EKG showed SR w/ PVCs. UA negative. CBC unremarkable. Hgb 12.7. BMP unremarkable, SCr/BUN WNL. No signs of dehydration.  K 4.6. Magnesium not checked. TSH normal. BNP elevated at 341.8.  CXR shows right basilar density is noted which corresponds to possible mass and malignancy noted on prior PET scan. No other significant abnormality seen.  Telemetry today shows pt had a 9 beat run of NSVT and frequent PVCs/  bigeminy. Code status is DNR.   Past Medical History:  Diagnosis Date  . Arthritis   . CAD (coronary artery disease) 03/2009   s/p MI  . Chronic combined systolic and diastolic heart failure ([place]) 06/17/2009   Qualifier: Diagnosis of  By: Burt Knack, MD, [place] Pert   . CKD (chronic kidney disease) stage 3, GFR 30-59 ml/min (HCC)   . Emphysema/COPD ([place])   . GERD (gastroesophageal reflux disease)   . HCAP (healthcare-associated pneumonia) 03/19/2016  . Helicobacter pylori  gastritis 10/2007   treated  . History of CVA (cerebrovascular accident) without residual deficits 01/2012, 06/2012   R hemorrhagic MCA 01/2012 with remote lacunar infarct L putamen and IC, rpt 06/2012 acute multifocal R MCA infarct with remote hemorrhagic strokes affecting L basal ganglia and periventricular white matter, full recovery  . HLD (hyperlipidemia)   . HTN (hypertension)   . Ischemic cardiomyopathy 02/27/2014  . Lung cancer ([place]) dx'd 07/2006   Lung CA, s/p resection, followed by Dr Earlie Server  . Macular degeneration   . Myocardial infarction [facility]) 03/2009   Acute myocardial infarction 2010 - treated with BMS of LCx. LVEF 50% with subsequent CHF  . NSTEMI (non-ST elevated myocardial infarction) ([place])   . Osteoporosis 09/2013   T -3.6 forearm 09/2013, T -4.5 forearm 04/2015  . Pre-diabetes   . Syncope 02/24/2018    Past Surgical History:  Procedure Laterality Date  . BREAST BIOPSY Left   . CARDIAC CATHETERIZATION N/A 02/24/2016   Procedure: Left Heart Cath and Coronary Angiography;  Surgeon: Belva Crome, MD;  Location: [place] [facility];  Service: Cardiovascular;  Laterality: N/A;  . CARDIAC CATHETERIZATION N/A 07/09/2016   Procedure: Left Heart Cath and Coronary Angiography;  Surgeon: Peter M Martinique, MD;  Location: [place] [facility];  Service: Cardiovascular;  Laterality: N/A;  . CARDIAC CATHETERIZATION N/A 07/09/2016   Procedure: Coronary Stent Intervention;  Surgeon: Peter M Martinique, MD;  Location: [place] [facility];  Service: Cardiovascular;  Laterality: N/A;  . CATARACT EXTRACTION Bilateral   . CORONARY STENT INTERVENTION N/A 07/06/2017   Procedure: CORONARY STENT INTERVENTION;  Surgeon: Lorretta Harp, MD;  Location: [place] [facility];  Service: Cardiovascular;  Laterality: N/A;  . CORONARY STENT PLACEMENT  06/2016   DES to mid LAD  . CORONARY/GRAFT ACUTE MI REVASCULARIZATION N/A 07/06/2017   Procedure: Coronary/Graft Acute MI Revascularization;  Surgeon: Lorretta Harp, MD;  Location: [place] [facility];  Service: Cardiovascular;  Laterality: N/A;  . EYE SURGERY    . LEFT HEART CATH AND CORONARY ANGIOGRAPHY N/A 07/06/2017   Procedure: LEFT HEART CATH AND CORONARY ANGIOGRAPHY;  Surgeon: Lorretta Harp, MD;  Location: [place] [facility];  Service: Cardiovascular;  Laterality: N/A;  . LUNG REMOVAL, PARTIAL Right 2008  . PARTIAL HYSTERECTOMY  1986   irregular periods, ovaries remain     Home Medications:  Prior to Admission medications   Medication Sig Start Date End Date Taking? Authorizing Provider  albuterol (PROAIR HFA) 108 (90 Base) MCG/ACT inhaler Inhale 2 puffs into the lungs every 4 (four) hours as needed for wheezing or shortness of breath. 09/16/17  Yes Byrum, Rose Fillers, MD  albuterol (PROVENTIL) (2.5 MG/3ML) 0.083% nebulizer solution Take 3 mLs (2.5 mg total) by nebulization every 4 (four) hours as needed for wheezing or shortness of breath. 07/31/16  Yes Collene Gobble, MD  aspirin EC 81 MG tablet Take 1 tablet (81 mg total) by mouth daily. 02/26/16  Yes Arbutus Leas,  NP  calcium carbonate (OS-CAL - DOSED IN MG OF ELEMENTAL CALCIUM) 1250 (500 Ca) MG tablet Take 1 tablet by mouth 2 (two) times daily with a meal.   Yes [provider]  clopidogrel (PLAVIX) 75 MG tablet Take 1 tablet (75 mg total) by mouth daily. 07/30/17 07/25/18 Yes Sherren Mocha, MD  denosumab (PROLIA) 60 MG/ML SOLN injection Inject 60 mg into the skin every 6 (six) months. Administer in upper arm, thigh, or abdomen 06/13/15  Yes Ria Bush, MD  fluticasone [facility]) 50 MCG/ACT nasal spray INSTILL 2 SPRAYS INTO BOTH NOSTRILS ONCEDAILY AS DIRECTED 12/08/17  Yes Byrum, Rose Fillers, MD  furosemide (LASIX) 20 MG tablet Take 1 tablet (20 mg total) by mouth daily. Take an additional 1/2 tablet daily for swelling. 07/09/17 07/04/18 Yes Kroeger, Daleen Snook M., PA-C  isosorbide mononitrate (IMDUR) 30 MG 24 hr tablet TAKE 1 TABLET BY MOUTH DAILY 04/30/17  Yes Sherren Mocha, MD    loratadine (CLARITIN) 10 MG tablet Take 10 mg by mouth daily as needed for allergies or rhinitis.    Yes [provider]  LOTEMAX 0.5 % ophthalmic suspension Place 1 drop into both eyes daily.  12/27/13  Yes [provider]  nitroGLYCERIN (NITROSTAT) 0.4 MG SL tablet Place 1 tablet (0.4 mg total) under the tongue every 5 (five) minutes as needed for chest pain. 07/03/15  Yes Weaver, Scott T, PA-C  pantoprazole (PROTONIX) 40 MG tablet Take 1 tablet (40 mg total) by mouth 2 (two) times daily. 02/17/18  Yes Collene Gobble, MD  prednisoLONE acetate (PRED FORTE) 1 % ophthalmic suspension 1 drop daily. Both eyes   Yes [provider]  simvastatin (ZOCOR) 40 MG tablet TAKE 1 TABLET BY MOUTH AT BEDTIME 10/06/17  Yes Sherren Mocha, MD  Spacer/Aero-Holding Chambers (AEROCHAMBER MV) inhaler Use as instructed 12/28/16  Yes Collene Gobble, MD  spironolactone (ALDACTONE) 25 MG tablet TAKE 1 TABLET BY MOUTH ONCE DAILY 12/29/17  Yes Sherren Mocha, MD    Inpatient Medications: Scheduled Meds: . aspirin EC  81 mg Oral Daily  . budesonide (PULMICORT) nebulizer solution  0.25 mg Nebulization BID  . clopidogrel  75 mg Oral Daily  . doxycycline  100 mg Oral Q12H  . enoxaparin (LOVENOX) injection  40 mg Subcutaneous Q24H  . fluticasone  2 spray Each Nare Daily  . furosemide  20 mg Oral Daily  . ipratropium-albuterol  3 mL Nebulization TID  . isosorbide mononitrate  30 mg Oral Daily  . lactose free nutrition  237 mL Oral TID WC  . loteprednol  1 drop Both Eyes Daily  . methylPREDNISolone (SOLU-MEDROL) injection  60 mg Intravenous Q8H  . pantoprazole  40 mg Oral BID  . prednisoLONE acetate  1 drop Both Eyes Daily  . simvastatin  40 mg Oral QHS  . sodium chloride flush  3 mL Intravenous Q12H  . sodium chloride flush  3 mL Intravenous Q12H  . spironolactone  25 mg Oral Daily   Continuous Infusions:  PRN Meds: acetaminophen **OR** acetaminophen, albuterol, ondansetron **OR**  ondansetron (ZOFRAN) IV  Allergies:    Allergies  Allergen Reactions  . Prednisone Other (See Comments)    Shaking (PILLS) Shot is ok  . Actonel [Risedronate Sodium] Other (See Comments)    Headache  . Amlodipine Other (See Comments)    Pedal edema  . Brilinta [Ticagrelor] Other (See Comments)    Worsening dyspnea, malaise  . Codeine Rash  . Fosamax [Alendronate Sodium] Other (See Comments)    Unable  to tolerate  . Lisinopril Cough  . Pravastatin Other (See Comments)    Constipation.    Social History:   Social History   Socioeconomic History  . Marital status: Widowed    Spouse name: Not on file  . Number of children: 1  . Years of education: Not on file  . Highest education level: Not on file  Occupational History  . Occupation: retired    Comment: insurance  Social Needs  . Financial resource strain: Not on file  . Food insecurity:    Worry: Not on file    Inability: Not on file  . Transportation needs:    Medical: Not on file    Non-medical: Not on file  Tobacco Use  . Smoking status: Former Smoker    Packs/day: 0.50    Years: 42.00    Pack years: 21.00    Types: Cigarettes    Last attempt to quit: 07/27/2002    Years since quitting: 15.5  . Smokeless tobacco: Never Used  Substance and Sexual Activity  . Alcohol use: No    Alcohol/week: 0.0 oz  . Drug use: No  . Sexual activity: Never  Lifestyle  . Physical activity:    Days per week: Not on file    Minutes per session: Not on file  . Stress: Not on file  Relationships  . Social connections:    Talks on phone: Not on file    Gets together: Not on file    Attends religious service: Not on file    Active member of club or organization: Not on file    Attends meetings of clubs or organizations: Not on file    Relationship status: Not on file  . Intimate partner violence:    Fear of current or ex partner: Not on file    Emotionally abused: Not on file    Physically abused: Not on file    Forced  sexual activity: Not on file  Other Topics Concern  . Not on file  Social History Narrative   The patient is a widow and has one son and one dog.     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  She does not drink alcohol. 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   Ed: HS   Activity: no regular exercise   Diet: some water, some fruits/vegetables    Lives in a one story home.     Family History:    Family History  Problem Relation Age of Onset  . CAD Mother 30       MI  . Stroke Mother   . Hypertension Mother   . Heart attack Mother   . CAD Father 60  . Stroke Father   . Breast cancer Sister   . Diabetes Sister      ROS:  Please see the history of present illness.   All other ROS reviewed and negative.     Physical Exam/Data:   Vitals:   02/26/18 0734 02/26/18 0900 02/26/18 1300 02/26/18 1418  BP:  113/63    Pulse:      Resp:      Temp:   (!) 97.5 F (36.4 C)   TempSrc:   Oral   SpO2: 99%   97%  Weight:      Height:        Intake/Output Summary (Last 24 hours) at 02/26/2018 1429 Last data  filed at 02/25/2018 1500 Gross per 24 hour  Intake -  Output 200 ml  Net -200 ml   Filed Weights   02/24/18 1646 02/25/18 0536 02/26/18 0528  Weight: 123 lb 14.4 oz (56.2 kg) 123 lb 4.8 oz (55.9 kg) 125 lb 6.4 oz (56.9 kg)   Body mass index is 20.24 kg/m.  General:  Elderly, frail WF in no acute distress  HEENT: normal Lymph: no adenopathy Neck: no JVD Endocrine:  No thryomegaly Vascular: No carotid bruits; FA pulses 2+ bilaterally without bruits  Cardiac:  Irregular rhythm, frequent PVCs no murmurs Lungs:  clear to auscultation bilaterally, no wheezing, rhonchi or rales  Abd: soft, nontender, no hepatomegaly  Ext: no edema Musculoskeletal:  No deformities, BUE and BLE strength normal and equal Skin: warm and dry  Neuro:  CNs 2-12 intact, no focal abnormalities noted Psych:  Normal affect   EKG:  The EKG was  personally reviewed and demonstrates:  SR w/ PVCs Telemetry:  Telemetry was personally reviewed and demonstrates:  9 beat run of NSVT, bigeminy   Relevant CV Studies: 2D Echo 06/2017 Study Conclusions  - Left ventricle: The cavity size was mildly dilated. Wall   thickness was normal. Systolic function was moderately to   severely reduced. The estimated ejection fraction was in the   range of 30% to 35%. Global hypokinesis with severe inferior wall   hypokinesis to akinesis. The study is not technically sufficient   to allow evaluation of LV diastolic function. - Aortic valve: Poorly visualized. There was no stenosis. Trace to   mild regurgitation. - Mitral valve: Mildly thickened leaflets . Dilated annulus with   posterior leaflet tethering. Mild to moderate regurgitation. - Left atrium: Moderately dilated. - Tricuspid valve: There was moderate regurgitation. - Pulmonary arteries: PA peak pressure: 40 mm Hg (S). - Inferior vena cava: The vessel was dilated. The respirophasic   diameter changes were blunted (< 50%), consistent with elevated   central venous pressure.  Impressions:  - Compared to a prior study in 06/2016, there are no significant   changes.  LHC 07/07/17  Dist LAD lesion is 100% stenosed.  Ost 1st Diag lesion is 80% stenosed.  Previously placed Prox Cx to Mid Cx stent (unknown type) is widely patent.  RPDA lesion is 100% stenosed.  Prox LAD-1 lesion is 40% stenosed.  Previously placed Prox LAD-2 drug eluting stent is widely patent.  Balloon angioplasty was performed.  Post intervention, there is a 100% residual stenosis.  Holter 04/22/17 - personally reviewed The basic rhythm is sinus.  There are no significant bradyarrhythmias or pathologic pauses > 3 seconds There are frequent PVC's (22%), as well as short ventricular runs No atrial fibrillation or sustained tachyarrhythmias     Laboratory Data:  Chemistry Recent Labs  Lab 02/24/18 1147  02/25/18 0332  NA 138 138  K 4.6 4.0  CL 102 102  CO2 28 28  GLUCOSE 106* 95  BUN 19 17  CREATININE 0.91 0.95  CALCIUM 9.5 9.1  GFRNONAA 59* 56*  GFRAA >60 >60  ANIONGAP 8 8    No results for input(s): PROT, ALBUMIN, AST, ALT, ALKPHOS, BILITOT in the last 168 hours. Hematology Recent Labs  Lab 02/24/18 1147 02/25/18 0332  WBC 10.2 7.2  RBC 4.05 3.87  HGB 13.1 12.7  HCT 38.7 37.0  MCV 95.6 95.6  MCH 32.3 32.8  MCHC 33.9 34.3  RDW 13.5 13.7  PLT 213 180   Cardiac EnzymesNo results for input(s): TROPONINI in  the last 168 hours.  Recent Labs  Lab 02/24/18 1205  TROPIPOC 0.01    BNP Recent Labs  Lab 02/24/18 1147  BNP 341.8*    DDimer No results for input(s): DDIMER in the last 168 hours.  Radiology/Studies:  Dg Chest 2 View  Result Date: 02/24/2018 CLINICAL DATA:  Shortness of breath. EXAM: CHEST - 2 VIEW COMPARISON:  Radiographs of January 20, 2018. PET scan of January 25, 2018. FINDINGS: The heart size and mediastinal contours are within normal limits. Atherosclerosis of thoracic aorta is noted. No pneumothorax or pleural effusion is noted. Left lung is unremarkable. Right basilar density is noted which corresponds to possible malignancy noted on prior PET scan. The visualized skeletal structures are unremarkable. IMPRESSION: Right basilar density is noted which corresponds to possible mass and malignancy noted on prior PET scan. No other significant abnormality seen. Aortic Atherosclerosis (ICD10-I70.0). Electronically Signed   By: Marijo Conception, M.D.   On: 02/24/2018 13:11   Ct Head Wo Contrast  Result Date: 02/24/2018 CLINICAL DATA:  Fall, syncope EXAM: CT HEAD WITHOUT CONTRAST TECHNIQUE: Contiguous axial images were obtained from the base of the skull through the vertex without intravenous contrast. COMPARISON:  MRI head 04/20/2015 FINDINGS: Brain: Chronic infarct in the right parietal lobe unchanged. Chronic lacunar infarction left basal ganglia unchanged. Chronic  microvascular ischemia throughout the white matter. Negative for acute hemorrhage, mass, or acute infarct. Moderate atrophy. Vascular: Negative for hyperdense vessel Skull: Negative Sinuses/Orbits: Mild mucosal edema paranasal sinuses. Bilateral cataract surgery. Other: None IMPRESSION: Atrophy and chronic ischemic change.  No acute abnormality. Electronically Signed   By: Franchot Gallo M.D.   On: 02/24/2018 14:21    Assessment and Plan:   [name] is a 77 y.o. 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 Zigmund Daniel, Internal Medicine.  --  Head CT negative. Labs including CBC, BMP, TSH and UA also negative. Tele notable for 9 beat run of NSVT and frequent PVCs in the pattern of bigeminy. Ventricular arrhthymias is a likely cause of her syncope. Unfortunately, she is intolerant to many therapies including beta blockers. She failed metoprolol and coreg due to fatigue. Not a candidate for Cardizem due to ischemic cardiomyopathy with EF of 30-35%. Also intolerant to mexiletine due to side effects. Her lung cancer and emphysema has limited use of amiodarone. She is not a candidate for ICD given her co morbidities, including lung CA. She is now DNR. Not sure if there is much more to offer. We will check Mg level to ensure level is stable. Would recommend keeping K >4 and Mg > 2. She does have known coronary disease as outline above, however initial troponin was negative and she denies any recent angina. Will defer further cardiac testing to MD. Cardiologist to follow with further recommendations.      For questions or updates, please contact [place] Please consult [email or web address] for  contact info under Cardiology/STEMI.   Signed, Lyda Jester, PA-C  02/26/2018 2:29 PM   The patient was seen and examined, and I agree with the history, physical exam, assessment and plan as documented above, with modifications as noted below. I  have also personally reviewed all relevant documentation, old records, labs, and both radiographic and cardiovascular studies. I have also independently interpreted old and new ECG's.  Briefly, this is a 77 year old woman with a complex medical and specifically cardiac history as detailed above with coronary artery disease and multivessel stenting, lung cancer, chronic systolic heart failure, multiple medication intolerances, admitted with syncope.  She was walking her dog and developed the sudden onset of acute on chronic shortness of breath.  She grabbed a close line and awoke with her dog licking her face.  She intermittently uses oxygen at night and was not wearing oxygen at the time.  She denied antecedent chest pain.  She was reportedly in rapid atrial fibrillation as per EMS I am unable to locate any telemetry strips documenting this.  She saw Dr. Curt Bears for high PVC burden and was initiated on low-dose mexiletine which she did not tolerate.  She has not tolerated beta-blockers either as it led to fatigue.  She is deemed not to be a good candidate for amiodarone given her pulmonary disease.  She specifically restated to me that she is a DNR and thus is not a candidate for a defibrillator.  There are multiple possible etiologies for her syncopal episode including acute hypoxemic respiratory failure due to COPD exacerbation and/or ventricular arrhythmia given her severely depressed left ventricular systolic function/ischemic cardiomyopathy.  Unfortunately, there is not much to offer from a cardiac standpoint as she is intolerant to multiple medications as noted above.  She currently denies anginal symptoms and troponin is normal.   Kate Sable, MD, [place] will sign off.   Medication Recommendations:  As above Other recommendations (labs, testing, etc):  No further testing Follow up as an outpatient:  With Dr. Burt Knack   02/26/2018 4:32 PM

## 2018-02-27 MED ORDER — DOXYCYCLINE HYCLATE 100 MG PO TABS: 100.0000 mg | ORAL_TABLET | Freq: Two times a day (BID) | ORAL | 0 refills | Status: DC

## 2018-02-27 MED ORDER — SPIRONOLACTONE 25 MG PO TABS: 12.5000 mg | ORAL_TABLET | Freq: Every day | ORAL | 0 refills | Status: DC

## 2018-02-27 NOTE — Discharge Summary (Signed)
Physician Discharge Summary  [name] RCV:[id number] DOB: 1941-02-21 DOA: 02/24/2018  PCP: Ria Bush, MD  Admit date: 02/24/2018 Discharge date: 02/27/2018  Admitted From:home Disposition: home Recommendations for Outpatient Follow-up:  1. Follow up with PCP in 1-2 weeks 2. Please obtain BMP/CBC in one week 3. Follow-up with cardiology Dr. Burt Knack  Home Health:  home health PT and RN Equipment/Devices: Oxygen at 4 L  Discharge Condition: Stable CODE STATUS: DO NOT RESUSCITATE Diet recommendation: Cardiac diet Brief/Interim Summary:77 year old female with history ofCVA; CAD; lung CA; HTN; HLD; COPD; stage 3 CKD; and chronic combined heart failure, presented with syncopal episode. Patient reported that she took her dog walking and then developed acute shortness of breath, grabbed a close line and does not remember anything until she woke up in the grass. Per patient she uses oxygen at home at night and occasionally during the day but was not wearing O2 during the episode. She also felt intermittent lightheadedness, coughing, wheezing at the time of the event. Per EMS patient had A. 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  The cause of syncope is multifactorial possible hypoxia versus arrhythmias.  Patient ran out of oxygen at home.  She has COPD multiple lung nodules concerning for malignancy.   She was treated with Solu-Medrol and doxycycline and SVN treatments.  She was on 2 L of oxygen prior to admission to the hospital now she needs 4 L to keep her saturation about 88%.  She will be discharged home today with home health PT and RN and home oxygen at 4 L.  She will follow-up with Dr. Lamonte Sakai regarding biopsy of the mass and lung nodules next week.  She is allergic to prednisone so I will not be discharging her on p.o. prednisone.  Finish the course of doxycycline as an outpatient.  Continue home nebulizer treatments.  Patient had multiple PVCs and wide-complex tachycardia during this hospital stay.  Electrolytes were repleted potassium magnesium.  She was given a dose of Lopressor after which PVCs diminished.  She was symptomatic with dizziness and pain in the back of her neck.  Cardiology consult was placed.  Patient has known history of high PVC burden followed by Dr. Curt Bears.  She was unable to tolerate mexiletine, unable to tolerate beta-blockers due to fatigue.  And she was not a candidate for amiodarone due to her pulmonary disease.  And she is a DNR and so not a candidate for defibrillator at this time.  Cardiology did not have much to offer from cardiac standpoint as she is intolerant to multiple medications.  Her cardiac enzymes remained normal.  EKG showed no acute ST-T wave changes.  She takes Imdur, Lasix, spironolactone at home.  Her blood pressure was soft to low normal here.  I will discharge her only on Imdur and 12.5 and spironolactone hold Lasix.  This may be restarted as an outpatient after  follow-up with PCP. HLD (hyperlipidemia) -Continue Zocor  Chronic combined systolic and diastolic heart failure (HCC) -Currently stable, appears to be compensated  CKD stage II -Creatinine currently at baseline, 0.9  Paroxysmal atrial fibrillation -Currently normal sinus rhythm, in the past has not been deemed a good candidate for anticoagulation.     Discharge  Instructions  Discharge Instructions    AMB Referral to [place] [facility]   Complete by:  As directed    Please assign to [place] for COPD education. Written consent obtained. PCP office Velora Heckler at [facility]) listed as doing toc. Referral received from inpatient RNCM. Currently at [facility]. Thanks. Marthenia Rolling, [place], RN,BSN-[facility] [phone]   Reason for consult:  Please assign to [place]   Diagnoses of:   COPD/ Pneumonia Heart Failure     Expected date of contact:  1-3 days (reserved for hospital discharges)   Call MD for:  difficulty breathing, headache or visual disturbances   Complete by:  As directed    Call MD for:  extreme fatigue   Complete by:  As directed    Call MD for:  persistant dizziness or light-headedness   Complete by:  As directed    Call MD for:  persistant nausea and vomiting   Complete by:  As directed    Call MD for:  redness, tenderness, or signs of infection (pain, swelling, redness, odor or green/yellow discharge around incision site)   Complete by:  As directed    Call MD for:  severe uncontrolled pain   Complete by:  As directed    Call MD for:  temperature >100.4   Complete by:  As directed    Diet - low sodium heart healthy   Complete by:  As directed    Increase activity slowly   Complete by:  As directed      Allergies as of 02/27/2018      Reactions   Prednisone Other (See Comments)   Shaking (PILLS) Shot is ok   Actonel [risedronate Sodium] Other (See Comments)   Headache   Amlodipine Other (See Comments)   Pedal edema   Brilinta [ticagrelor] Other (See Comments)   Worsening dyspnea, malaise   Codeine Rash   Fosamax [alendronate Sodium] Other (See Comments)   Unable to tolerate   Lisinopril Cough   Pravastatin Other (See Comments)   Constipation.      Medication List    STOP taking these medications   furosemide 20 MG tablet Commonly known as:  LASIX     TAKE these  medications   AEROCHAMBER MV inhaler Use as instructed   albuterol (2.5 MG/3ML) 0.083% nebulizer solution Commonly known as:  PROVENTIL Take 3 mLs (2.5 mg total) by nebulization every 4 (four) hours as needed for wheezing or shortness of breath.   albuterol 108 (90 Base) MCG/ACT inhaler Commonly known as:  PROAIR HFA Inhale 2 puffs into the lungs every 4 (four) hours as needed for wheezing or shortness of breath.   aspirin EC 81 MG tablet Take 1 tablet (81 mg total) by mouth daily.   calcium carbonate 1250 (500 Ca) MG tablet Commonly known as:  OS-CAL - dosed in mg of elemental calcium Take 1 tablet by mouth 2 (two) times daily with a meal.   clopidogrel 75 MG tablet Commonly known as:  PLAVIX Take 1 tablet (75 mg total) by mouth daily.   denosumab 60 MG/ML Soln injection Commonly known as:  PROLIA  Inject 60 mg into the skin every 6 (six) months. Administer in upper arm, thigh, or abdomen   doxycycline 100 MG tablet Commonly known as:  VIBRA-TABS Take 1 tablet (100 mg total) by mouth every 12 (twelve) hours.   fluticasone 50 MCG/ACT nasal spray Commonly known as:  FLONASE INSTILL 2 SPRAYS INTO BOTH NOSTRILS ONCEDAILY AS DIRECTED   isosorbide mononitrate 30 MG 24 hr tablet Commonly known as:  IMDUR TAKE 1 TABLET BY MOUTH DAILY   loratadine 10 MG tablet Commonly known as:  CLARITIN Take 10 mg by mouth daily as needed for allergies or rhinitis.   LOTEMAX 0.5 % ophthalmic suspension Generic drug:  loteprednol Place 1 drop into both eyes daily.   nitroGLYCERIN 0.4 MG SL tablet Commonly known as:  NITROSTAT Place 1 tablet (0.4 mg total) under the tongue every 5 (five) minutes as needed for chest pain.   pantoprazole 40 MG tablet Commonly known as:  PROTONIX Take 1 tablet (40 mg total) by mouth 2 (two) times daily.   prednisoLONE acetate 1 % ophthalmic suspension Commonly known as:  PRED FORTE 1 drop daily. Both eyes   simvastatin 40 MG tablet Commonly known as:   ZOCOR TAKE 1 TABLET BY MOUTH AT BEDTIME   spironolactone 25 MG tablet Commonly known as:  ALDACTONE Take 0.5 tablets (12.5 mg total) by mouth daily. What changed:  how much to take            Durable Medical Equipment  (From admission, onward)        Start     Ordered   02/27/18 0947  DME Oxygen  Once    Question Answer Comment  Mode or (Route) Nasal cannula   Liters per Minute 4   Frequency Continuous (stationary and portable oxygen unit needed)   Oxygen conserving device Yes   Oxygen delivery system Gas      02/27/18 0947     Follow-up Information    [facility] Follow up.   Specialty:  Home Health Services Why:  Home Health Registered Nurse/Physical Therapy.  Contact information: [place] [address] [phone]          Allergies  Allergen Reactions  . Prednisone Other (See Comments)    Shaking (PILLS) Shot is ok  . Actonel [Risedronate Sodium] Other (See Comments)    Headache  . Amlodipine Other (See Comments)    Pedal edema  . Brilinta [Ticagrelor] Other (See Comments)    Worsening dyspnea, malaise  . Codeine Rash  . Fosamax [Alendronate Sodium] Other (See Comments)    Unable to tolerate  . Lisinopril Cough  . Pravastatin Other (See Comments)    Constipation.    Consultations:  cardiology   Procedures/Studies: Dg Chest 2 View  Result Date: 02/24/2018 CLINICAL DATA:  Shortness of breath. EXAM: CHEST - 2 VIEW COMPARISON:  Radiographs of January 20, 2018. PET scan of January 25, 2018. FINDINGS: The heart size and mediastinal contours are within normal limits. Atherosclerosis of thoracic aorta is noted. No pneumothorax or pleural effusion is noted. Left lung is unremarkable. Right basilar density is noted which corresponds to possible malignancy noted on prior PET scan. The visualized skeletal structures are unremarkable. IMPRESSION: Right basilar density is noted which corresponds to possible mass and  malignancy noted on prior PET scan. No other significant abnormality seen. Aortic Atherosclerosis (ICD10-I70.0). Electronically Signed   By: Marijo Conception, M.D.   On: 02/24/2018 13:11   Ct Head Wo Contrast  Result Date: 02/24/2018 CLINICAL DATA:  Fall, syncope EXAM: CT HEAD WITHOUT CONTRAST TECHNIQUE: Contiguous axial images were obtained from the base of the skull through the vertex without intravenous contrast. COMPARISON:  MRI head 04/20/2015 FINDINGS: Brain: Chronic infarct in the right parietal lobe unchanged. Chronic lacunar infarction left basal ganglia unchanged. Chronic microvascular ischemia throughout the white matter. Negative for acute hemorrhage, mass, or acute infarct. Moderate atrophy. Vascular: Negative for hyperdense vessel Skull: Negative Sinuses/Orbits: Mild mucosal edema paranasal sinuses. Bilateral cataract surgery. Other: None IMPRESSION: Atrophy and chronic ischemic change.  No acute abnormality. Electronically Signed   By: Franchot Gallo M.D.   On: 02/24/2018 14:21    (Echo, Carotid, EGD, Colonoscopy, ERCP)    Subjective:   Discharge Exam: Vitals:   02/27/18 0816 02/27/18 0821  BP:    Pulse:    Resp:    Temp:    SpO2: 91% 91%   Vitals:   02/27/18 0457 02/27/18 0503 02/27/18 0816 02/27/18 0821  BP:  (!) 113/53    Pulse: 82 80    Resp: 15 (!) 21    Temp: 98.3 F (36.8 C) 98.3 F (36.8 C)    TempSrc: Oral Oral    SpO2:  97% 91% 91%  Weight:  58.8 kg (129 lb 11.2 oz)    Height:        General: Pt is alert, awake, not in acute distress Cardiovascular: RRR, S1/S2 +, no rubs, no gallops Respiratory: CTA bilaterally, no wheezing, no rhonchi Abdominal: Soft, NT, ND, bowel sounds + Extremities: no edema, no cyanosis    The results of significant diagnostics from this hospitalization (including imaging, microbiology, ancillary and laboratory) are listed below for reference.     Microbiology: Recent Results (from the past 240 hour(s))  Urine culture      Status: Abnormal   Collection Time: 02/24/18  1:35 PM  Result Value Ref Range Status   Specimen Description URINE, RANDOM  Final   Special Requests NONE  Final   Culture (A)  Final    <10,000 COLONIES/mL INSIGNIFICANT GROWTH Performed at [facility], [address] [place], [place] [address]    Report Status 02/25/2018 FINAL  Final     Labs: BNP (last 3 results) Recent Labs    07/06/17 2336 07/09/17 0604 02/24/18 1147  BNP 539.4* 460.0* 786.7*   Basic Metabolic Panel: Recent Labs  Lab 02/24/18 1147 02/25/18 0332  NA 138 138  K 4.6 4.0  CL 102 102  CO2 28 28  GLUCOSE 106* 95  BUN 19 17  CREATININE 0.91 0.95  CALCIUM 9.5 9.1   Liver Function Tests: No results for input(s): AST, ALT, ALKPHOS, BILITOT, PROT, ALBUMIN in the last 168 hours. No results for input(s): LIPASE, AMYLASE in the last 168 hours. No results for input(s): AMMONIA in the last 168 hours. CBC: Recent Labs  Lab 02/24/18 1147 02/25/18 0332  WBC 10.2 7.2  HGB 13.1 12.7  HCT 38.7 37.0  MCV 95.6 95.6  PLT 213 180   Cardiac Enzymes: No results for input(s): CKTOTAL, CKMB, CKMBINDEX, TROPONINI in the last 168 hours. BNP: Invalid input(s): POCBNP CBG: Recent Labs  Lab 02/24/18 1154  GLUCAP 108*   D-Dimer No results for input(s): DDIMER in the last 72 hours. Hgb A1c No results for input(s): HGBA1C in the last 72 hours. Lipid Profile No results for input(s): CHOL, HDL, LDLCALC, TRIG, CHOLHDL, LDLDIRECT in the last 72 hours. Thyroid function studies Recent Labs    02/25/18 0332  TSH 1.918   Anemia work up No results for input(s): VITAMINB12, FOLATE, FERRITIN, TIBC, IRON, RETICCTPCT in the last 72 hours. Urinalysis    Component Value Date/Time   COLORURINE COLORLESS (A) 02/24/2018 1335   APPEARANCEUR CLEAR 02/24/2018 1335   LABSPEC 1.005 02/24/2018 1335   PHURINE 6.0 02/24/2018 1335   GLUCOSEU NEGATIVE 02/24/2018 1335   HGBUR NEGATIVE 02/24/2018 1335   BILIRUBINUR NEGATIVE  02/24/2018 1335   KETONESUR NEGATIVE 02/24/2018 1335   PROTEINUR NEGATIVE 02/24/2018 1335   UROBILINOGEN 0.2 05/05/2009 0353   NITRITE NEGATIVE 02/24/2018 1335   LEUKOCYTESUR NEGATIVE 02/24/2018 1335   Sepsis Labs Invalid input(s): PROCALCITONIN,  WBC,  LACTICIDVEN Microbiology Recent Results (from the past 240 hour(s))  Urine culture     Status: Abnormal   Collection Time: 02/24/18  1:35 PM  Result Value Ref Range Status   Specimen Description URINE, RANDOM  Final   Special Requests NONE  Final   Culture (A)  Final    <10,000 COLONIES/mL INSIGNIFICANT GROWTH Performed at [facility], [place] [address], [place], [place] [address]    Report Status 02/25/2018 FINAL  Final     Time coordinating discharge: 34 minutes  SIGNED:   Georgette Shell, MD  [facility] 02/27/2018, 9:47 AM Pager   If 7PM-7AM, please contact night-coverage [email or web address] Password [email or web address]

## 2018-02-28 ENCOUNTER — Encounter: Payer: Self-pay | Admitting: Emergency Medicine

## 2018-02-28 ENCOUNTER — Encounter: Payer: Self-pay | Admitting: Cardiovascular Disease

## 2018-02-28 ENCOUNTER — Telehealth: Payer: Self-pay | Admitting: Emergency Medicine

## 2018-02-28 ENCOUNTER — Telehealth: Payer: Self-pay | Admitting: *Deleted

## 2018-02-28 NOTE — Telephone Encounter (Signed)
RB - please advise on Pulmicort. Pt's OV has been moved up to 03/11/18 at 12pm.

## 2018-02-28 NOTE — Telephone Encounter (Signed)
Spoke to pt and attempted to complete TCM. Pt states she was "not up to it" and was "feeling ok, just too weak to get out of the bed." Per Dr Danise Mina, ok for pt to be seen outside of 1wk suggested f/u time. Pt scheduled for 8/12

## 2018-02-28 NOTE — Telephone Encounter (Signed)
Called and spoke with pt's niece Almyra Free who stated while pt was in the hospital, she was being given Pulmicort neb solution. Almyra Free is requesting a Rx to be sent in for pt as it helped pt while she was using it in the hospital.  Almyra Free was unsure why pt was not sent home a Rx when she was discharged from the hospital.  Dr Lamonte Sakai, please advise if you are okay for [place] to send a script in for pt of pulmicort neb sol and if so, what quantity, and how often? Thanks!

## 2018-03-01 ENCOUNTER — Encounter: Payer: Self-pay | Admitting: *Deleted

## 2018-03-01 ENCOUNTER — Telehealth: Payer: Self-pay | Admitting: Family Medicine

## 2018-03-01 DIAGNOSIS — Z902 Acquired absence of lung [part of]: Secondary | ICD-10-CM | POA: Diagnosis not present

## 2018-03-01 DIAGNOSIS — I5042 Chronic combined systolic (congestive) and diastolic (congestive) heart failure: Secondary | ICD-10-CM | POA: Diagnosis not present

## 2018-03-01 DIAGNOSIS — J9611 Chronic respiratory failure with hypoxia: Secondary | ICD-10-CM | POA: Diagnosis not present

## 2018-03-01 DIAGNOSIS — K219 Gastro-esophageal reflux disease without esophagitis: Secondary | ICD-10-CM | POA: Diagnosis not present

## 2018-03-01 DIAGNOSIS — I130 Hypertensive heart and chronic kidney disease with heart failure and stage 1 through stage 4 chronic kidney disease, or unspecified chronic kidney disease: Secondary | ICD-10-CM | POA: Diagnosis not present

## 2018-03-01 DIAGNOSIS — I2510 Atherosclerotic heart disease of native coronary artery without angina pectoris: Secondary | ICD-10-CM | POA: Diagnosis not present

## 2018-03-01 DIAGNOSIS — Z7951 Long term (current) use of inhaled steroids: Secondary | ICD-10-CM | POA: Diagnosis not present

## 2018-03-01 DIAGNOSIS — Z7982 Long term (current) use of aspirin: Secondary | ICD-10-CM | POA: Diagnosis not present

## 2018-03-01 DIAGNOSIS — N183 Chronic kidney disease, stage 3 (moderate): Secondary | ICD-10-CM | POA: Diagnosis not present

## 2018-03-01 DIAGNOSIS — M810 Age-related osteoporosis without current pathological fracture: Secondary | ICD-10-CM | POA: Diagnosis not present

## 2018-03-01 DIAGNOSIS — E785 Hyperlipidemia, unspecified: Secondary | ICD-10-CM | POA: Diagnosis not present

## 2018-03-01 DIAGNOSIS — C3431 Malignant neoplasm of lower lobe, right bronchus or lung: Secondary | ICD-10-CM | POA: Diagnosis not present

## 2018-03-01 DIAGNOSIS — I255 Ischemic cardiomyopathy: Secondary | ICD-10-CM | POA: Diagnosis not present

## 2018-03-01 DIAGNOSIS — Z8673 Personal history of transient ischemic attack (TIA), and cerebral infarction without residual deficits: Secondary | ICD-10-CM | POA: Diagnosis not present

## 2018-03-01 DIAGNOSIS — Z87891 Personal history of nicotine dependence: Secondary | ICD-10-CM | POA: Diagnosis not present

## 2018-03-01 DIAGNOSIS — I480 Paroxysmal atrial fibrillation: Secondary | ICD-10-CM | POA: Diagnosis not present

## 2018-03-01 DIAGNOSIS — Z9981 Dependence on supplemental oxygen: Secondary | ICD-10-CM | POA: Diagnosis not present

## 2018-03-01 DIAGNOSIS — Z7902 Long term (current) use of antithrombotics/antiplatelets: Secondary | ICD-10-CM | POA: Diagnosis not present

## 2018-03-01 DIAGNOSIS — J441 Chronic obstructive pulmonary disease with (acute) exacerbation: Secondary | ICD-10-CM | POA: Diagnosis not present

## 2018-03-01 DIAGNOSIS — Z955 Presence of coronary angioplasty implant and graft: Secondary | ICD-10-CM | POA: Diagnosis not present

## 2018-03-01 DIAGNOSIS — R55 Syncope and collapse: Secondary | ICD-10-CM | POA: Diagnosis not present

## 2018-03-01 NOTE — Telephone Encounter (Signed)
Attempted to call patient today regarding rx for neb solution. Waiting to hear back from [name] to advise I did not receive an answer at time of call. I have left a voicemail message for [name] to return call. X1

## 2018-03-01 NOTE — Telephone Encounter (Signed)
Copied from [place] [id number]. Topic: Quick Communication - See Telephone Encounter >> Mar 01, 2018  3:21 PM Burchel, Abbi R wrote: CRM for notification. See Telephone encounter for: 03/01/18.  Amy ([place])  requesting v/o for nursing 1*5wk    Amy: [phone]

## 2018-03-01 NOTE — Telephone Encounter (Signed)
Pt is calling for Pulmicort Neb solution she want's to know more information about it.  [phone]

## 2018-03-01 NOTE — Telephone Encounter (Signed)
Agree with verbal order 

## 2018-03-02 ENCOUNTER — Telehealth: Payer: Self-pay | Admitting: Emergency Medicine

## 2018-03-02 ENCOUNTER — Ambulatory Visit: Payer: PPO | Admitting: Cardiology

## 2018-03-02 ENCOUNTER — Telehealth: Payer: Self-pay | Admitting: Family Medicine

## 2018-03-02 ENCOUNTER — Encounter: Payer: Self-pay | Admitting: Cardiology

## 2018-03-02 VITALS — BP 110/60 | HR 72 | Ht 66.0 in | Wt 126.0 lb

## 2018-03-02 DIAGNOSIS — I480 Paroxysmal atrial fibrillation: Secondary | ICD-10-CM

## 2018-03-02 NOTE — Telephone Encounter (Signed)
Pt has been scheduled with Lyda Jester, PA for post hospital follow up. Pt agrees with plan and will be in the office today.

## 2018-03-02 NOTE — Patient Instructions (Addendum)
Medication Instructions:   IF YOU HAVE ANY WEIGHT GAIN OF 127 LBS OR MORE TAKE AN EXTRA LASIX    CONTACT OFFICE IF CHANGES DOESNT WORK    If you need a refill on your cardiac medications before your next appointment, please call your pharmacy.  Labwork: NONE ORDERED  TODAY    Testing/Procedures:NONE ORDERED  TODAY    Follow-Up: AS SCHEDULED    Any Other Special Instructions Will Be Listed Below (If Applicable).  IF YOU HAVE ANY WEIGHT GAIN OF 127 LBS OR MORE TAKE AN EXTRA LASIX

## 2018-03-02 NOTE — Telephone Encounter (Signed)
Copied from [place] [phone]. Topic: Quick Communication - See Telephone Encounter >> Mar 02, 2018  4:34 PM Rutherford Nail, NT wrote: CRM for notification. See Telephone encounter for: 03/02/18. Gerald Stabs, physical therapist with [facility], calling and would like verbal order for the following: 2x a week for 2 weeks starting next week CB#: [phone]

## 2018-03-02 NOTE — Telephone Encounter (Signed)
Routing message to RB to advise when he returns to office on 03/04/18

## 2018-03-02 NOTE — Telephone Encounter (Signed)
Spoke with Amy of [facility] informing her Dr. Darnell Level is giving verbal orders for nursing.  Verbalizes understanding.

## 2018-03-02 NOTE — Telephone Encounter (Signed)
Agree with this. Thank you.  

## 2018-03-02 NOTE — Telephone Encounter (Signed)
Patient states  she received Pulmicort in the hospital and felt that it worked well for her. She is asking now that she is out of the hospital if this would be beneficial for her to continue this.  Please advise; thanks.

## 2018-03-02 NOTE — Progress Notes (Signed)
03/02/2018 [place]   July 26, 1941  [id number]  Primary Physician Ria Bush, MD Primary Cardiologist: Dr. Burt Knack Electrophysiologist: Dr. Curt Bears  Reason for Visit/CC: [facility] for Syncope.   HPI:  Tami Blass Sabic is a 77 y.o. 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  Ms. Louks has a complex history. She initially presented with an acute MI in 2010 treated with bare-metal stenting of the left circumflex. She has had recurrent non-STEMI in 2017 when she was found to have severe stenosis in the mid LAD treated with a drug-eluting stent. She presented again 06/2017 with an inferior STEMI and was found to have total occlusion of the distal right PDA. Attempts were made at aspiration and balloon angioplasty but flow was never able to be restored into the most distal part of the vessel. There was no high-grade proximal stenosis identified. She has had other problems including dizziness and bradycardia. She has frequent PVCs. She has chronic shortness of breath with a history of lung cancer and COPD. She is intolerant to Brilinta. This was discontinued in January and she was changed to Plavix. She has also been unable to tolerate beta blockers. She was on Coreg previously and this was discontinued. Also intolerant to Metoprolol. She is also followed by Dr. Curt Bears for PVCs. Due to high burden and inability to tolerate beta blockers, he recommended she start mexiletine however pt noncompliant. She also had side effects and discontinued after 1 week of therapy. It is mentioned in telephone encounters that Dr. Burt Knack advised avoidance of amiodarone due to her  lung issues.   Also of note, pt had a recent PET scan on 7/2 which showed left lower lobe nodule essentially stable but increased in metabolic activity favoring malignancy, right lower lobe mass slightly larger from the previous imaging. This is being followed by Dr. Lamonte Sakai.   On 02/24/18 pt came to the [facility] ED for syncope. She was out walking her dog morning of admit and suddenly became short of breath, then had syncope and collapse and woke up in the grass with her dog licking her face. Unwitnessed event. She denied any associated CP or palpitations. She was just a few steps from her door and was able to crawl in and call EMS. Per H&P, EMS noted afib w/ RVR enroute to the ED, however there were no records to confirm this.   In ED, CT of head was negative. CBG normal at 108. POC troponin negative. EKG showed SR w/ PVCs. UA negative. CBC unremarkable. Hgb 12.7. BMP unremarkable, SCr/BUN WNL. No signs of dehydration.  K 4.6. Magnesium not checked. TSH normal. BNP elevated at 341.8.  CXR showed right basilar density is noted which corresponds to possible mass and malignancy noted on prior PET scan. No other significant abnormality seen. Telemetry did show a 9 beat run of NSVT and frequent PVCs/ bigeminy. Code status was changed DNR.   I evaluated her during her hospitalization. She was also seen by Dr. Bronson Ing. It was felt that there were multiple possible etiologies for her syncopal episode including acute hypoxemic respiratory failure due to COPD exacerbation and/or ventricular arrhythmia given her  severely depressed left ventricular systolic function/ischemic cardiomyopathy.   The hospitalist felt that lasix may have contributed to her syncope and discontinued it. She was advised to continue all other cardiac meds including spironolactone.   Unfortunately, there was not much more to offer her from a cardiac standpoint as she is intolerant to multiple medications as noted above. We did not recommend any  additional cardiac w/u or therapies.  She is back in clinic today and denies any recurrent syncope or near syncope. She has noticed however slight increase in weight and her pants feel like they are fitting tighter. Her weight is up to 127 lb based on her home scales. Her normal weight is 123-125 lb. There is no LEE. She remains on supplemental O2. She denies CP.    Current Meds  Medication Sig  . albuterol (PROAIR HFA) 108 (90 Base) MCG/ACT inhaler Inhale 2 puffs into the lungs every 4 (four) hours as needed for wheezing or shortness of breath.  Marland Kitchen albuterol (PROVENTIL) (2.5 MG/3ML) 0.083% nebulizer solution Take 3 mLs (2.5 mg total) by nebulization every 4 (four) hours as needed for wheezing or shortness of breath.  Marland Kitchen aspirin EC 81 MG tablet Take 1 tablet (81 mg total) by mouth daily.  . calcium carbonate (OS-CAL - DOSED IN MG OF ELEMENTAL CALCIUM) 1250 (500 Ca) MG tablet Take 1 tablet by mouth 2 (two) times daily with a meal.  . clopidogrel (PLAVIX) 75 MG tablet Take 1 tablet (75 mg total) by mouth daily.  Marland Kitchen denosumab (PROLIA) 60 MG/ML SOLN injection Inject 60 mg into the skin every 6 (six) months. Administer in upper arm, thigh, or abdomen  . doxycycline (VIBRA-TABS) 100 MG tablet Take 1 tablet (100 mg total) by mouth every 12 (twelve) hours.  . fluticasone (FLONASE) 50 MCG/ACT nasal spray INSTILL 2 SPRAYS INTO BOTH NOSTRILS ONCEDAILY AS DIRECTED  . isosorbide mononitrate (IMDUR) 30 MG 24 hr tablet TAKE 1 TABLET BY MOUTH DAILY  . loratadine (CLARITIN) 10 MG tablet Take 10 mg by mouth daily as needed for allergies or rhinitis.   . LOTEMAX 0.5 % ophthalmic suspension Place 1 drop into both eyes daily.   . nitroGLYCERIN (NITROSTAT) 0.4 MG SL tablet Place 1 tablet (0.4 mg total) under the tongue every 5 (five) minutes as needed for chest pain.  . pantoprazole (PROTONIX) 40 MG tablet Take 1 tablet (40 mg total) by mouth 2 (two) times daily.  . prednisoLONE acetate (PRED FORTE) 1 % ophthalmic  suspension 1 drop daily. Both eyes  . simvastatin (ZOCOR) 40 MG tablet TAKE 1 TABLET BY MOUTH AT BEDTIME  . Spacer/Aero-Holding Chambers (AEROCHAMBER MV) inhaler Use as instructed  . spironolactone (ALDACTONE) 25 MG tablet Take 0.5 tablets (12.5 mg total) by mouth daily.   Current Facility-Administered Medications for the 03/02/18 encounter (Office Visit) with Consuelo Pandy, PA-C  Medication  . ipratropium-albuterol (DUONEB) 0.5-2.5 (3) MG/3ML nebulizer solution 3 mL   Allergies  Allergen Reactions  . Prednisone Other (See Comments)    Shaking (PILLS) Shot is ok  . Actonel [Risedronate Sodium] Other (See Comments)    Headache  . Amlodipine Other (See Comments)    Pedal edema  . Brilinta [Ticagrelor] Other (See Comments)    Worsening dyspnea, malaise  . Codeine Rash  . Fosamax [Alendronate Sodium] Other (See Comments)    Unable to tolerate  . Lisinopril Cough  . Pravastatin Other (See Comments)    Constipation.   Past Medical History:  Diagnosis Date  . Arthritis   .  CAD (coronary artery disease) 03/2009   s/p MI  . Chronic combined systolic and diastolic heart failure ([place]) 06/17/2009   Qualifier: Diagnosis of  By: Burt Knack, MD, Clayburn Pert   . CKD (chronic kidney disease) stage 3, GFR 30-59 ml/min (HCC)   . Emphysema/COPD ([place])   . GERD (gastroesophageal reflux disease)   . HCAP (healthcare-associated pneumonia) 03/19/2016  . Helicobacter pylori gastritis 10/2007   treated  . History of CVA (cerebrovascular accident) without residual deficits 01/2012, 06/2012   R hemorrhagic MCA 01/2012 with remote lacunar infarct L putamen and IC, rpt 06/2012 acute multifocal R MCA infarct with remote hemorrhagic strokes affecting L basal ganglia and periventricular white matter, full recovery  . HLD (hyperlipidemia)   . HTN (hypertension)   . Ischemic cardiomyopathy 02/27/2014  . Lung cancer ([place]) dx'd 07/2006   Lung CA, s/p resection, followed by Dr Earlie Server  . Macular degeneration     . Myocardial infarction [facility]) 03/2009   Acute myocardial infarction 2010 - treated with BMS of LCx. LVEF 50% with subsequent CHF  . NSTEMI (non-ST elevated myocardial infarction) ([place])   . Osteoporosis 09/2013   T -3.6 forearm 09/2013, T -4.5 forearm 04/2015  . Pre-diabetes   . Syncope 02/24/2018   Family History  Problem Relation Age of Onset  . CAD Mother 16       MI  . Stroke Mother   . Hypertension Mother   . Heart attack Mother   . CAD Father 51  . Stroke Father   . Breast cancer Sister   . Diabetes Sister    Past Surgical History:  Procedure Laterality Date  . BREAST BIOPSY Left   . CARDIAC CATHETERIZATION N/A 02/24/2016   Procedure: Left Heart Cath and Coronary Angiography;  Surgeon: Belva Crome, MD;  Location: [place] [facility];  Service: Cardiovascular;  Laterality: N/A;  . CARDIAC CATHETERIZATION N/A 07/09/2016   Procedure: Left Heart Cath and Coronary Angiography;  Surgeon: Peter M Martinique, MD;  Location: [place] [facility];  Service: Cardiovascular;  Laterality: N/A;  . CARDIAC CATHETERIZATION N/A 07/09/2016   Procedure: Coronary Stent Intervention;  Surgeon: Peter M Martinique, MD;  Location: [place] [facility];  Service: Cardiovascular;  Laterality: N/A;  . CATARACT EXTRACTION Bilateral   . CORONARY STENT INTERVENTION N/A 07/06/2017   Procedure: CORONARY STENT INTERVENTION;  Surgeon: Lorretta Harp, MD;  Location: [place] [facility];  Service: Cardiovascular;  Laterality: N/A;  . CORONARY STENT PLACEMENT  06/2016   DES to mid LAD  . CORONARY/GRAFT ACUTE MI REVASCULARIZATION N/A 07/06/2017   Procedure: Coronary/Graft Acute MI Revascularization;  Surgeon: Lorretta Harp, MD;  Location: [place] [facility];  Service: Cardiovascular;  Laterality: N/A;  . EYE SURGERY    . LEFT HEART CATH AND CORONARY ANGIOGRAPHY N/A 07/06/2017   Procedure: LEFT HEART CATH AND CORONARY ANGIOGRAPHY;  Surgeon: Lorretta Harp, MD;  Location: [place] [facility];  Service: Cardiovascular;   Laterality: N/A;  . LUNG REMOVAL, PARTIAL Right 2008  . PARTIAL HYSTERECTOMY  1986   irregular periods, ovaries remain   Social History   Socioeconomic History  . Marital status: Widowed    Spouse name: Not on file  . Number of children: 1  . Years of education: Not on file  . Highest education level: Not on file  Occupational History  . Occupation: retired    Comment: insurance  Social Needs  . Financial resource strain: Not on file  . Food insecurity:  Worry: Not on file    Inability: Not on file  . Transportation needs:    Medical: Not on file    Non-medical: Not on file  Tobacco Use  . Smoking status: Former Smoker    Packs/day: 0.50    Years: 42.00    Pack years: 21.00    Types: Cigarettes    Last attempt to quit: 07/27/2002    Years since quitting: 15.6  . Smokeless tobacco: Never Used  Substance and Sexual Activity  . Alcohol use: No    Alcohol/week: 0.0 oz  . Drug use: No  . Sexual activity: Never  Lifestyle  . Physical activity:    Days per week: Not on file    Minutes per session: Not on file  . Stress: Not on file  Relationships  . Social connections:    Talks on phone: Not on file    Gets together: Not on file    Attends religious service: Not on file    Active member of club or organization: Not on file    Attends meetings of clubs or organizations: Not on file    Relationship status: Not on file  . Intimate partner violence:    Fear of current or ex partner: Not on file    Emotionally abused: Not on file    Physically abused: Not on file    Forced sexual activity: Not on file  Other Topics Concern  . Not on file  Social History Narrative   The patient is a widow and has one son and one dog.     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  She does not drink alcohol. 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   Ed: HS   Activity: no regular exercise   Diet: some water, some  fruits/vegetables    Lives in a one story home.      Review of Systems: General: negative for chills, fever, night sweats or weight changes.  Cardiovascular: negative for chest pain, dyspnea on exertion, edema, orthopnea, palpitations, paroxysmal nocturnal dyspnea or shortness of breath Dermatological: negative for rash Respiratory: negative for cough or wheezing Urologic: negative for hematuria Abdominal: negative for nausea, vomiting, diarrhea, bright red blood per rectum, melena, or hematemesis Neurologic: negative for visual changes, syncope, or dizziness All other systems reviewed and are otherwise negative except as noted above.   Physical Exam:  Blood pressure 110/60, pulse 72, height 5\' 6"  (1.676 m), weight 126 lb (57.2 kg), SpO2 96 %.  General appearance: alert, cooperative and no distress, frail appearing Neck: no carotid bruit and no JVD Lungs: clear to auscultation bilaterally Heart: regular rate and rhythm, S1, S2 normal, no murmur, click, rub or gallop Extremities: extremities normal, atraumatic, no cyanosis or edema Pulses: 2+ and symmetric Skin: Skin color, texture, turgor normal. No rashes or lesions Neurologic: Grossly normal  EKG NSR w/ PVCs -- personally reviewed   ASSESSMENT AND PLAN:   1. Syncope: per above. She denies any recurrent syncope or near syncope. There were multiple possible etiologies for her syncopal episode including acute hypoxemic respiratory failure due to COPD exacerbation and/or ventricular arrhythmia given her severely depressed left ventricular systolic function/ischemic cardiomyopathy. Unfortunately, there was not much more to offer her from a cardiac standpoint as she is intolerant to multiple medications as noted above.  We do not recommend any additional cardiac w/u at this time.   2. CAD:  complex history as outlined above in HPI. She denies angina. Continue medical therapy.   3. Chronic Systolic HF: EF 77-11%. Not a candidate for ICD  given DNR and multiple co morbidities. Her weight has increased since Lasix was stopped at time of discharge. We discussed adding back PRN based on weight. Her baseline dry weight at home is 123-126 lb. She weighed in at 127 lb today and notes her pants are fitting a bit tighter. She was advised to take lasix today and to call our office again if additional weight gain. She will continue on spironolactone.  4. PVCs: Unfortunately, she is intolerant to many therapies including beta blockers. She failed metoprolol and coreg due to fatigue. Not a candidate for Cardizem due to ischemic cardiomyopathy with EF of 30-35%. Also intolerant to mexiletine due to side effects. Her lung cancer and emphysema has limited use of amiodarone. She is not a candidate for ICD given her co morbidities, including lung CA. She is now DNR. There is not much more to offer.  7. Chronic Hypoxic Respiratory Failure: on chronic supplemental O2 due to COPD and h/o lung CA. Per Dr. Lamonte Sakai.    Follow-Up: pt has recall to f/u with Dr. Burt Knack in November   Nickcole Bralley Ladoris Gene, MHS [facility] 03/02/2018 9:57 AM

## 2018-03-03 NOTE — Telephone Encounter (Signed)
Pt calling back to check status of Pulmicort.  I advised that we were still awaiting RB's recs, and that we would relay this to her as soon as we hear back from [place].  Pt expressed understanding.  RB please advise if ok to prescribe Pulmicort for pt.  Thanks.

## 2018-03-03 NOTE — Telephone Encounter (Signed)
Pt is calling back [phone]

## 2018-03-03 NOTE — Telephone Encounter (Signed)
Spoke with Gerald Stabs, of [facility], informing him Dr. Darnell Level is giving verbal orders for PT.  Verbalizes understanding.

## 2018-03-04 NOTE — Telephone Encounter (Signed)
Attempted to call patient today regarding RB recommendations. I did not receive an answer at time of call. I have left a voicemail message for pt to return call. X1

## 2018-03-04 NOTE — Telephone Encounter (Signed)
Okay with me to do a trial of Pulmicort nebs twice daily to see if she benefits.  Please order for her.

## 2018-03-04 NOTE — Telephone Encounter (Signed)
I believe I answered this in another message.  Okay to do a trial of Pulmicort nebs twice a day

## 2018-03-07 ENCOUNTER — Ambulatory Visit: Payer: PPO | Admitting: Family Medicine

## 2018-03-07 MED ORDER — BUDESONIDE 0.5 MG/2ML IN SUSP: 0.5000 mg | Freq: Two times a day (BID) | RESPIRATORY_TRACT | 6 refills | Status: DC

## 2018-03-07 NOTE — Telephone Encounter (Signed)
Called and spoke with patient Regarding RB recombinations she verbalized understanding and I have sent in the prescription in to the preferred pharmacy. Nothing further Needed at this time.

## 2018-03-07 NOTE — Telephone Encounter (Signed)
Pt is calling back [phone]

## 2018-03-08 NOTE — Telephone Encounter (Signed)
Attempted to call patient today regarding RB recommendations. I did not receive an answer at time of call. I have left a voicemail message forptto return call. X2

## 2018-03-09 NOTE — Telephone Encounter (Signed)
Called and spoke with pt regarding RB Recommendations Advised pt placed order to pharmacy for budesonide neb med Pt verbalized understanding Nothing further needed

## 2018-03-10 ENCOUNTER — Encounter: Payer: Self-pay | Admitting: Emergency Medicine

## 2018-03-10 ENCOUNTER — Ambulatory Visit: Payer: PPO | Admitting: Emergency Medicine

## 2018-03-10 VITALS — BP 120/60 | HR 60 | Ht 66.0 in | Wt 132.0 lb

## 2018-03-10 DIAGNOSIS — R918 Other nonspecific abnormal finding of lung field: Secondary | ICD-10-CM

## 2018-03-10 DIAGNOSIS — J449 Chronic obstructive pulmonary disease, unspecified: Secondary | ICD-10-CM | POA: Diagnosis not present

## 2018-03-10 DIAGNOSIS — J9611 Chronic respiratory failure with hypoxia: Secondary | ICD-10-CM | POA: Diagnosis not present

## 2018-03-10 DIAGNOSIS — J301 Allergic rhinitis due to pollen: Secondary | ICD-10-CM

## 2018-03-10 NOTE — Assessment & Plan Note (Signed)
Continue DuoNeb, Pulmicort as ordered.

## 2018-03-10 NOTE — Assessment & Plan Note (Signed)
She has slow-growing pulmonary nodules that are hypermetabolic on PET scan.  Suspect that these are adenocarcinomas given her history.  Very difficult risk-benefit profile here.  She is debilitated, has a high oxygen need and I am not sure that she can tolerate a needle biopsy or subsequent therapy should we document lung cancer.  I explained this to her today.  I have recommended that we continue to wait and watch.  We could reconsider if her pleuritic symptoms worsen or if we feel that the pace of progression makes treatment more beneficial than any potential side effects or risks.  Repeat her CT scan of the chest in November

## 2018-03-10 NOTE — Assessment & Plan Note (Signed)
She finally acknowledges that she needs to be compliant with her oxygen.  She is hoping that she can qualify for POC in order to make the device more portable both in and out of the home.  I am not sure that she will qualify for a pulsed system but we will try to do so.

## 2018-03-10 NOTE — Progress Notes (Signed)
Subjective:    Patient ID: [name], female    DOB: [birth year], 77 y.o.   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  We have been following pulmonary nodules with serial imaging that have waxed and waned in size.  She is been dealing with chronic cough and purulent mucus production, has been treated on multiple occasions for bronchitis and/or flares.  She believes her cough is being impacted by labile GERD, that she was better when she was on Protonix twice a day.  She underwent swallowing eval 7/5 that showed distal spasms, ? contributing to reflux and cough. She has chronic hypoxemia but she does not always wear her oxygen with exertion. Her cough right now is well managed. Currently on protonix only qd. She has noticed some R flank and pleuritic pain.   A PET scan was done to follow her nodules and risk assess on 01/25/2018.  I have reviewed and this shows stable size of her pleural-based left lower lobe nodule but an increase in its hypermetabolic activity, now SUV 6.1.  Her pleural-based right lower lobe mass is also increased in size, has hypermetabolic activity.  There was no evidence of hypermetabolic lymphadenopathy.   ROV 03/10/18 --77 year old woman who follows up today for her COPD.  She has a history of adenocarcinoma of the lung with a right lower lobe superior segmentectomy in the past and now with bilateral pulmonary nodules that are slowly increasing in size by serial CT scans.  They were hypermetabolic on PET scan 09/26/4399.  She also has a history of chronic cough in episodes of bronchitis.  She has labile GERD even on Protonix, some distal spasming and possible aspiration.  I increased her Protonix to twice a day last visit.    Unfortunately she was just admitted the beginning of this month with syncope.  She  stated that it was preceded by dyspnea.  She was found to be in atrial fibrillation with RVR. She was walking outside without her O2.  She feels that she would do best with a POC, but not clear to me that she can tolerate pulsed O2.   She has considered the strategy of biopsy of her peripheral nodules, but she is concerned whether she can tolerate. I certainly understand this.    Review of Systems     As per history of present illness    Objective:   Physical Exam Vitals:   03/10/18 1442  BP: 120/60  Pulse: 60  SpO2: 100%  Weight: 132 lb (59.9 kg)  Height: 5\' 6"  (1.676 m)    Gen: Anxious, elderly woman, in mild resp distress  ENT: No lesions,  mouth clear,  oropharynx clear, no postnasal drip, weak hoarse voice  Neck: No JVD, no stridor  Lungs: tachypneic, small breaths but no wheezing. No wheeze on forced exp  Cardiovascular: RRR, heart sounds normal, no murmur or gallops, no peripheral edema. Her HR on my recheck was in the 60's  Musculoskeletal: No deformities, no cyanosis or clubbing  Neuro: alert, non focal  Skin: Warm, no lesions or rashes      Assessment & Plan:  Pulmonary nodules She has slow-growing pulmonary nodules that are hypermetabolic on PET scan.  Suspect that these are adenocarcinomas given her history.  Very difficult risk-benefit profile here.  She is debilitated, has a high oxygen need and  I am not sure that she can tolerate a needle biopsy or subsequent therapy should we document lung cancer.  I explained this to her today.  I have recommended that we continue to wait and watch.  We could reconsider if her pleuritic symptoms worsen or if we feel that the pace of progression makes treatment more beneficial than any potential side effects or risks.  Repeat her CT scan of the chest in November  Allergic rhinitis Left-sided nasal obstruction is influencing her ability to wear her nasal cannula effectively.  Of asked her to start using her Flonase twice a day  on the left.  COPD (chronic obstructive pulmonary disease) (HCC) Continue DuoNeb, Pulmicort as ordered.  Chronic respiratory failure with hypoxia (HCC) She finally acknowledges that she needs to be compliant with her oxygen.  She is hoping that she can qualify for POC in order to make the device more portable both in and out of the home.  I am not sure that she will qualify for a pulsed system but we will try to do so.  Baltazar Apo, MD, PhD 03/10/2018, 3:23 PM [facility] [phone] or if no answer [phone]

## 2018-03-10 NOTE — Patient Instructions (Signed)
We will ask [facility] to assess to see if you can tolerate pulsed flow oxygen. If so then we will obtain a portable oxygen concentrator.  We will try to obtain ear pads for your nasal canula.  You need to wear your oxygen at all times. Do not walk without it.  Continue your duoneb four times a day Continue Pulmicort nebs twice a day We discussed her pulmonary nodules today.  At this time we will hold off on planning a lung nodule biopsy given the risks and difficulty associated with the procedure and the uncertainty as to the benefits and side effects associated with possible therapy should there be any evidence for a slow-growing lung cancer. We will repeat your CT scan of the chest in November 2019 without contrast Follow with Dr Lamonte Sakai in 3 months or sooner if you have any problems.

## 2018-03-10 NOTE — Assessment & Plan Note (Signed)
Left-sided nasal obstruction is influencing her ability to wear her nasal cannula effectively.  Of asked her to start using her Flonase twice a day on the left.

## 2018-03-11 ENCOUNTER — Ambulatory Visit: Payer: PPO | Admitting: Emergency Medicine

## 2018-03-15 ENCOUNTER — Other Ambulatory Visit: Payer: Self-pay | Admitting: *Deleted

## 2018-03-15 NOTE — Patient Outreach (Signed)
[facility]) Care Management  03/15/2018  Clydean Posas [place]ardo 20-Dec-1940 [id number]   RN Health Coach attempted follow up outreach call to patient.  Patient was unavailable. HIPPA compliance voicemail message left with return callback number.  Plan: RN will call patient again within 10 business  days.  [place] [facility] Management [phone]

## 2018-03-18 ENCOUNTER — Encounter (HOSPITAL_COMMUNITY): Payer: Self-pay | Admitting: Emergency Medicine

## 2018-03-18 ENCOUNTER — Observation Stay (HOSPITAL_COMMUNITY)
Admission: EM | Admit: 2018-03-18 | Discharge: 2018-03-21 | Disposition: A | Discharge status: Skilled Nursing Facility | Payer: PPO | Attending: Family Medicine | Admitting: Family Medicine

## 2018-03-18 ENCOUNTER — Ambulatory Visit (HOSPITAL_BASED_OUTPATIENT_CLINIC_OR_DEPARTMENT_OTHER): Payer: PPO

## 2018-03-18 ENCOUNTER — Other Ambulatory Visit: Payer: Self-pay

## 2018-03-18 ENCOUNTER — Emergency Department (HOSPITAL_COMMUNITY): Payer: PPO

## 2018-03-18 DIAGNOSIS — J90 Pleural effusion, not elsewhere classified: Secondary | ICD-10-CM | POA: Diagnosis not present

## 2018-03-18 DIAGNOSIS — Z885 Allergy status to narcotic agent status: Secondary | ICD-10-CM | POA: Insufficient documentation

## 2018-03-18 DIAGNOSIS — I454 Nonspecific intraventricular block: Secondary | ICD-10-CM | POA: Diagnosis not present

## 2018-03-18 DIAGNOSIS — M1990 Unspecified osteoarthritis, unspecified site: Secondary | ICD-10-CM | POA: Insufficient documentation

## 2018-03-18 DIAGNOSIS — I447 Left bundle-branch block, unspecified: Secondary | ICD-10-CM | POA: Diagnosis not present

## 2018-03-18 DIAGNOSIS — J449 Chronic obstructive pulmonary disease, unspecified: Secondary | ICD-10-CM | POA: Diagnosis not present

## 2018-03-18 DIAGNOSIS — I340 Nonrheumatic mitral (valve) insufficiency: Secondary | ICD-10-CM | POA: Diagnosis not present

## 2018-03-18 DIAGNOSIS — J309 Allergic rhinitis, unspecified: Secondary | ICD-10-CM | POA: Insufficient documentation

## 2018-03-18 DIAGNOSIS — Z87891 Personal history of nicotine dependence: Secondary | ICD-10-CM | POA: Insufficient documentation

## 2018-03-18 DIAGNOSIS — H3530 Unspecified macular degeneration: Secondary | ICD-10-CM | POA: Insufficient documentation

## 2018-03-18 DIAGNOSIS — Z888 Allergy status to other drugs, medicaments and biological substances status: Secondary | ICD-10-CM | POA: Diagnosis not present

## 2018-03-18 DIAGNOSIS — I420 Dilated cardiomyopathy: Secondary | ICD-10-CM | POA: Insufficient documentation

## 2018-03-18 DIAGNOSIS — J301 Allergic rhinitis due to pollen: Secondary | ICD-10-CM | POA: Diagnosis not present

## 2018-03-18 DIAGNOSIS — I130 Hypertensive heart and chronic kidney disease with heart failure and stage 1 through stage 4 chronic kidney disease, or unspecified chronic kidney disease: Principal | ICD-10-CM | POA: Insufficient documentation

## 2018-03-18 DIAGNOSIS — Z9842 Cataract extraction status, left eye: Secondary | ICD-10-CM | POA: Insufficient documentation

## 2018-03-18 DIAGNOSIS — Z7951 Long term (current) use of inhaled steroids: Secondary | ICD-10-CM | POA: Insufficient documentation

## 2018-03-18 DIAGNOSIS — R05 Cough: Secondary | ICD-10-CM

## 2018-03-18 DIAGNOSIS — I252 Old myocardial infarction: Secondary | ICD-10-CM | POA: Insufficient documentation

## 2018-03-18 DIAGNOSIS — K219 Gastro-esophageal reflux disease without esophagitis: Secondary | ICD-10-CM | POA: Insufficient documentation

## 2018-03-18 DIAGNOSIS — I051 Rheumatic mitral insufficiency: Secondary | ICD-10-CM | POA: Insufficient documentation

## 2018-03-18 DIAGNOSIS — N183 Chronic kidney disease, stage 3 (moderate): Secondary | ICD-10-CM | POA: Insufficient documentation

## 2018-03-18 DIAGNOSIS — I491 Atrial premature depolarization: Secondary | ICD-10-CM | POA: Diagnosis not present

## 2018-03-18 DIAGNOSIS — I5023 Acute on chronic systolic (congestive) heart failure: Secondary | ICD-10-CM | POA: Diagnosis not present

## 2018-03-18 DIAGNOSIS — Z8249 Family history of ischemic heart disease and other diseases of the circulatory system: Secondary | ICD-10-CM | POA: Insufficient documentation

## 2018-03-18 DIAGNOSIS — J810 Acute pulmonary edema: Secondary | ICD-10-CM | POA: Diagnosis not present

## 2018-03-18 DIAGNOSIS — R0602 Shortness of breath: Secondary | ICD-10-CM | POA: Diagnosis not present

## 2018-03-18 DIAGNOSIS — R059 Cough, unspecified: Secondary | ICD-10-CM

## 2018-03-18 DIAGNOSIS — J9611 Chronic respiratory failure with hypoxia: Secondary | ICD-10-CM | POA: Diagnosis not present

## 2018-03-18 DIAGNOSIS — R42 Dizziness and giddiness: Secondary | ICD-10-CM | POA: Diagnosis not present

## 2018-03-18 DIAGNOSIS — Z66 Do not resuscitate: Secondary | ICD-10-CM | POA: Insufficient documentation

## 2018-03-18 DIAGNOSIS — Z955 Presence of coronary angioplasty implant and graft: Secondary | ICD-10-CM | POA: Insufficient documentation

## 2018-03-18 DIAGNOSIS — Z7982 Long term (current) use of aspirin: Secondary | ICD-10-CM | POA: Insufficient documentation

## 2018-03-18 DIAGNOSIS — Z7902 Long term (current) use of antithrombotics/antiplatelets: Secondary | ICD-10-CM | POA: Insufficient documentation

## 2018-03-18 DIAGNOSIS — Z8673 Personal history of transient ischemic attack (TIA), and cerebral infarction without residual deficits: Secondary | ICD-10-CM | POA: Diagnosis not present

## 2018-03-18 DIAGNOSIS — R7303 Prediabetes: Secondary | ICD-10-CM | POA: Diagnosis not present

## 2018-03-18 DIAGNOSIS — Z79899 Other long term (current) drug therapy: Secondary | ICD-10-CM | POA: Insufficient documentation

## 2018-03-18 DIAGNOSIS — I110 Hypertensive heart disease with heart failure: Secondary | ICD-10-CM | POA: Diagnosis not present

## 2018-03-18 DIAGNOSIS — I5043 Acute on chronic combined systolic (congestive) and diastolic (congestive) heart failure: Secondary | ICD-10-CM | POA: Diagnosis not present

## 2018-03-18 DIAGNOSIS — Z9841 Cataract extraction status, right eye: Secondary | ICD-10-CM | POA: Insufficient documentation

## 2018-03-18 DIAGNOSIS — I2510 Atherosclerotic heart disease of native coronary artery without angina pectoris: Secondary | ICD-10-CM | POA: Insufficient documentation

## 2018-03-18 DIAGNOSIS — Z90711 Acquired absence of uterus with remaining cervical stump: Secondary | ICD-10-CM | POA: Insufficient documentation

## 2018-03-18 DIAGNOSIS — J441 Chronic obstructive pulmonary disease with (acute) exacerbation: Secondary | ICD-10-CM | POA: Diagnosis present

## 2018-03-18 DIAGNOSIS — E785 Hyperlipidemia, unspecified: Secondary | ICD-10-CM | POA: Insufficient documentation

## 2018-03-18 DIAGNOSIS — I255 Ischemic cardiomyopathy: Secondary | ICD-10-CM | POA: Diagnosis not present

## 2018-03-18 DIAGNOSIS — J811 Chronic pulmonary edema: Secondary | ICD-10-CM | POA: Diagnosis present

## 2018-03-18 DIAGNOSIS — Z951 Presence of aortocoronary bypass graft: Secondary | ICD-10-CM | POA: Insufficient documentation

## 2018-03-18 DIAGNOSIS — Z85118 Personal history of other malignant neoplasm of bronchus and lung: Secondary | ICD-10-CM | POA: Insufficient documentation

## 2018-03-18 LAB — CBC WITH DIFFERENTIAL/PLATELET
Abs Immature Granulocytes: 0 10*3/uL (ref 0.0–0.1)
BASOS PCT: 1 %
Basophils Absolute: 0.1 10*3/uL (ref 0.0–0.1)
EOS PCT: 10 %
Eosinophils Absolute: 0.6 10*3/uL (ref 0.0–0.7)
HCT: 38.7 % (ref 36.0–46.0)
Hemoglobin: 12.6 g/dL (ref 12.0–15.0)
Immature Granulocytes: 0 %
Lymphocytes Relative: 25 %
Lymphs Abs: 1.6 10*3/uL (ref 0.7–4.0)
MCH: 31.5 pg (ref 26.0–34.0)
MCHC: 32.6 g/dL (ref 30.0–36.0)
MCV: 96.8 fL (ref 78.0–100.0)
MONO ABS: 0.4 10*3/uL (ref 0.1–1.0)
Monocytes Relative: 7 %
NEUTROS ABS: 3.6 10*3/uL (ref 1.7–7.7)
Neutrophils Relative %: 57 %
PLATELETS: 201 10*3/uL (ref 150–400)
RBC: 4 MIL/uL (ref 3.87–5.11)
RDW: 14 % (ref 11.5–15.5)
WBC: 6.3 10*3/uL (ref 4.0–10.5)

## 2018-03-18 LAB — COMPREHENSIVE METABOLIC PANEL
ALT: 22 U/L (ref 0–44)
ANION GAP: 7 (ref 5–15)
AST: 23 U/L (ref 15–41)
Albumin: 3.3 g/dL — ABNORMAL LOW (ref 3.5–5.0)
Alkaline Phosphatase: 64 U/L (ref 38–126)
BUN: 21 mg/dL (ref 8–23)
CHLORIDE: 107 mmol/L (ref 98–111)
CO2: 25 mmol/L (ref 22–32)
Calcium: 8.7 mg/dL — ABNORMAL LOW (ref 8.9–10.3)
Creatinine, Ser: 0.81 mg/dL (ref 0.44–1.00)
GFR calc non Af Amer: >60 mL/min (ref >60)
Glucose, Bld: 109 mg/dL — ABNORMAL HIGH (ref 70–99)
POTASSIUM: 4.3 mmol/L (ref 3.5–5.1)
SODIUM: 139 mmol/L (ref 135–145)
Total Bilirubin: 0.5 mg/dL (ref 0.3–1.2)
Total Protein: 5.9 g/dL — ABNORMAL LOW (ref 6.5–8.1)

## 2018-03-18 LAB — I-STAT TROPONIN, ED: Troponin i, poc: 0.03 ng/mL (ref 0.00–0.08)

## 2018-03-18 LAB — ECHOCARDIOGRAM COMPLETE
Height: 66 in
Weight: 2049.4 oz

## 2018-03-18 LAB — TROPONIN I
Troponin I: 0.04 ng/mL (ref <0.03)
Troponin I: 0.04 ng/mL (ref <0.03)

## 2018-03-18 LAB — MAGNESIUM: Magnesium: 1.9 mg/dL (ref 1.7–2.4)

## 2018-03-18 LAB — BRAIN NATRIURETIC PEPTIDE: B Natriuretic Peptide: 317.9 pg/mL — ABNORMAL HIGH (ref 0.0–100.0)

## 2018-03-18 MED ORDER — ALBUTEROL SULFATE (2.5 MG/3ML) 0.083% IN NEBU: 2.5000 mg | INHALATION_SOLUTION | RESPIRATORY_TRACT | Status: DC | PRN

## 2018-03-18 MED ORDER — PREDNISOLONE ACETATE 1 % OP SUSP
1.0000 [drp] | Freq: Every day | OPHTHALMIC | Status: DC
  Administered 2018-03-18 – 2018-03-21 (×4): 1 [drp] via OPHTHALMIC
  Filled 2018-03-18: qty 5

## 2018-03-18 MED ORDER — ONDANSETRON HCL 4 MG/2ML IJ SOLN
4.0000 mg | Freq: Once | INTRAMUSCULAR | Status: AC
  Administered 2018-03-18: 4 mg via INTRAVENOUS
  Filled 2018-03-18: qty 2

## 2018-03-18 MED ORDER — ONDANSETRON HCL 4 MG/2ML IJ SOLN: 4.0000 mg | Freq: Four times a day (QID) | INTRAMUSCULAR | Status: DC | PRN

## 2018-03-18 MED ORDER — FLUTICASONE PROPIONATE 50 MCG/ACT NA SUSP
2.0000 | Freq: Every day | NASAL | Status: DC
  Administered 2018-03-18 – 2018-03-21 (×4): 2 via NASAL
  Filled 2018-03-18: qty 16

## 2018-03-18 MED ORDER — ISOSORBIDE MONONITRATE ER 30 MG PO TB24
30.0000 mg | ORAL_TABLET | Freq: Every day | ORAL | Status: DC
  Administered 2018-03-18 – 2018-03-21 (×4): 30 mg via ORAL
  Filled 2018-03-18 (×4): qty 1

## 2018-03-18 MED ORDER — LORATADINE 10 MG PO TABS: 10.0000 mg | ORAL_TABLET | Freq: Every day | ORAL | Status: DC | PRN

## 2018-03-18 MED ORDER — FUROSEMIDE 10 MG/ML IJ SOLN
20.0000 mg | Freq: Once | INTRAMUSCULAR | Status: AC
  Administered 2018-03-18: 20 mg via INTRAVENOUS
  Filled 2018-03-18: qty 2

## 2018-03-18 MED ORDER — MECLIZINE HCL 25 MG PO TABS
25.0000 mg | ORAL_TABLET | Freq: Two times a day (BID) | ORAL | Status: DC | PRN
  Administered 2018-03-18 – 2018-03-21 (×5): 25 mg via ORAL
  Filled 2018-03-18 (×5): qty 1

## 2018-03-18 MED ORDER — FUROSEMIDE 20 MG PO TABS
20.0000 mg | ORAL_TABLET | Freq: Every day | ORAL | Status: DC
  Administered 2018-03-19 – 2018-03-21 (×3): 20 mg via ORAL
  Filled 2018-03-18 (×3): qty 1

## 2018-03-18 MED ORDER — IPRATROPIUM-ALBUTEROL 0.5-2.5 (3) MG/3ML IN SOLN
3.0000 mL | Freq: Four times a day (QID) | RESPIRATORY_TRACT | Status: DC
  Administered 2018-03-18 – 2018-03-21 (×13): 3 mL via RESPIRATORY_TRACT
  Filled 2018-03-18 (×13): qty 3

## 2018-03-18 MED ORDER — MECLIZINE HCL 25 MG PO TABS
25.0000 mg | ORAL_TABLET | Freq: Once | ORAL | Status: AC
  Administered 2018-03-18: 25 mg via ORAL
  Filled 2018-03-18: qty 1

## 2018-03-18 MED ORDER — SODIUM CHLORIDE 0.9 % IV BOLUS
250.0000 mL | Freq: Once | INTRAVENOUS | Status: AC
  Administered 2018-03-18: 250 mL via INTRAVENOUS

## 2018-03-18 MED ORDER — SODIUM CHLORIDE 0.9 % IV SOLN: 250.0000 mL | INTRAVENOUS | Status: DC | PRN

## 2018-03-18 MED ORDER — BUDESONIDE 0.5 MG/2ML IN SUSP
0.5000 mg | Freq: Two times a day (BID) | RESPIRATORY_TRACT | Status: DC
  Administered 2018-03-18 – 2018-03-21 (×6): 0.5 mg via RESPIRATORY_TRACT
  Filled 2018-03-18 (×7): qty 2

## 2018-03-18 MED ORDER — SIMVASTATIN 40 MG PO TABS
40.0000 mg | ORAL_TABLET | Freq: Every day | ORAL | Status: DC
  Administered 2018-03-18 – 2018-03-20 (×3): 40 mg via ORAL
  Filled 2018-03-18 (×3): qty 1

## 2018-03-18 MED ORDER — SODIUM CHLORIDE 0.9% FLUSH
3.0000 mL | Freq: Two times a day (BID) | INTRAVENOUS | Status: DC
  Administered 2018-03-18 – 2018-03-21 (×5): 3 mL via INTRAVENOUS

## 2018-03-18 MED ORDER — CALCIUM CARBONATE 1250 (500 CA) MG PO TABS
1.0000 | ORAL_TABLET | Freq: Two times a day (BID) | ORAL | Status: DC
  Administered 2018-03-18 – 2018-03-21 (×6): 500 mg via ORAL
  Filled 2018-03-18 (×6): qty 1

## 2018-03-18 MED ORDER — ACETAMINOPHEN 325 MG PO TABS: 650.0000 mg | ORAL_TABLET | ORAL | Status: DC | PRN

## 2018-03-18 MED ORDER — HYDROXYZINE HCL 25 MG PO TABS
25.0000 mg | ORAL_TABLET | Freq: Once | ORAL | Status: AC
  Administered 2018-03-18: 25 mg via ORAL
  Filled 2018-03-18: qty 1

## 2018-03-18 MED ORDER — LOTEPREDNOL ETABONATE 0.5 % OP SUSP
1.0000 [drp] | Freq: Every day | OPHTHALMIC | Status: DC
  Filled 2018-03-18: qty 5

## 2018-03-18 MED ORDER — ENOXAPARIN SODIUM 40 MG/0.4ML ~~LOC~~ SOLN
40.0000 mg | Freq: Every day | SUBCUTANEOUS | Status: DC
  Administered 2018-03-18 – 2018-03-20 (×3): 40 mg via SUBCUTANEOUS
  Filled 2018-03-18 (×3): qty 0.4

## 2018-03-18 MED ORDER — CLOPIDOGREL BISULFATE 75 MG PO TABS
75.0000 mg | ORAL_TABLET | Freq: Every day | ORAL | Status: DC
  Administered 2018-03-18 – 2018-03-21 (×4): 75 mg via ORAL
  Filled 2018-03-18 (×4): qty 1

## 2018-03-18 MED ORDER — PANTOPRAZOLE SODIUM 40 MG PO TBEC
40.0000 mg | DELAYED_RELEASE_TABLET | Freq: Two times a day (BID) | ORAL | Status: DC
  Administered 2018-03-18 – 2018-03-21 (×7): 40 mg via ORAL
  Filled 2018-03-18 (×7): qty 1

## 2018-03-18 MED ORDER — SODIUM CHLORIDE 0.9% FLUSH: 3.0000 mL | INTRAVENOUS | Status: DC | PRN

## 2018-03-18 MED ORDER — NITROGLYCERIN 0.4 MG SL SUBL: 0.4000 mg | SUBLINGUAL_TABLET | SUBLINGUAL | Status: DC | PRN

## 2018-03-18 MED ORDER — ASPIRIN EC 81 MG PO TBEC
81.0000 mg | DELAYED_RELEASE_TABLET | Freq: Every day | ORAL | Status: DC
  Administered 2018-03-18 – 2018-03-21 (×4): 81 mg via ORAL
  Filled 2018-03-18 (×4): qty 1

## 2018-03-18 MED ORDER — SPIRONOLACTONE 12.5 MG HALF TABLET
12.5000 mg | ORAL_TABLET | Freq: Every day | ORAL | Status: DC
  Administered 2018-03-19 – 2018-03-21 (×3): 12.5 mg via ORAL
  Filled 2018-03-18 (×4): qty 1

## 2018-03-18 NOTE — Progress Notes (Signed)
  Echocardiogram 2D Echocardiogram has been performed.  [name] 03/18/2018, 4:57 PM

## 2018-03-18 NOTE — ED Triage Notes (Signed)
Pt BIB EMS from home, reporting new dizziness & nausea around 2000 tonight. Worse when up and moving. Denies CP, nausea. Mild SOB when moving to stretcher. Wheezing noted by EMS and pt given 5mg  albuterol, as well as 4mg  zofran for nausea.

## 2018-03-18 NOTE — ED Notes (Signed)
Pt up to walk with portable o2 and walker ,did well but felt shaky dr Sedonia Small aware

## 2018-03-18 NOTE — ED Notes (Signed)
MD at bedside. 

## 2018-03-18 NOTE — Evaluation (Signed)
Physical Therapy Evaluation Patient Details Name: [name] MRN: [id number] DOB: 04-03-1941 Today's Date: 03/18/2018   History of Present Illness  77yo female presenting with dizziness, nausea with urge to vomit, DOE. Note recent admission in early August for syncope. Diagnosed with acute pulmonary edema, vertigo. PMH CAD, CAD, CHF, CKD, emphysema/COPD, CVA, HTN, lung CA, macular degeneration, MI, NSTEMI, syncope, partial lung removal, cardiac cath   Clinical Impression   Patient received in bed, very pleasant and willing to participate in PT today. Began session with orthostatic check due to low BP as follows:  Supine 98/61 Sitting 107/58 Standing 134/72 Standing after 3 minutes 97/76.  She is able to complete all functional bed mobility with S, however does require Min guard and RW for ambulation and functional transfers today. Did not progress gait out of room due to fluctuating BP this afternoon. Patient does tend to hold her head cocked and reports ongoing vertigo and dizziness that is better as long as she does not move her head out of this position. With general vestibular screen, did not note signs/symptoms of central involvement and suspect possible left horizontal canal BPPV- if able will attempt to have follow up by vestibular PT while she is in the acute setting. She was left in bed with family present and ultrasound tech present and attending, all needs otherwise met.     Follow Up Recommendations Home health PT;Supervision/Assistance - 24 hour    Equipment Recommendations  None recommended by PT    Recommendations for Other Services       Precautions / Restrictions Precautions Precautions: Fall Restrictions Weight Bearing Restrictions: No      Mobility  Bed Mobility Overal bed mobility: Needs Assistance Bed Mobility: Supine to Sit;Sit to Supine     Supine to sit: Supervision;HOB elevated Sit to supine: Supervision;HOB elevated   General bed mobility  comments: supervision for safety, no phyiscal assist required  Transfers Overall transfer level: Needs assistance Equipment used: Rolling walker (2 wheeled) Transfers: Sit to/from Stand Sit to Stand: Min guard         General transfer comment: increased effort, min guard for safety   Ambulation/Gait Ambulation/Gait assistance: Min guard Gait Distance (Feet): 40 Feet Assistive device: Rolling walker (2 wheeled) Gait Pattern/deviations: Step-through pattern;Decreased stride length;Decreased dorsiflexion - right;Decreased dorsiflexion - left;Trunk flexed     General Gait Details: limited gait today due to fluctuating BP today; SpO2 remained at 98% on 4LPM O2 which she uses at baseline   Stairs            Wheelchair Mobility    Modified Rankin (Stroke Patients Only)       Balance Overall balance assessment: Needs assistance Sitting-balance support: Feet supported Sitting balance-Leahy Scale: Good     Standing balance support: Bilateral upper extremity supported;During functional activity Standing balance-Leahy Scale: Fair                               Pertinent Vitals/Pain Pain Assessment: No/denies pain    Home Living Family/patient expects to be discharged to:: Private residence Living Arrangements: Alone Available Help at Discharge: Family;Available PRN/intermittently Type of Home: House Home Access: Stairs to enter Entrance Stairs-Rails: Right;Left;Can reach both Entrance Stairs-Number of Steps: 7 Home Layout: One level Home Equipment: Shower seat;Grab bars - toilet;Other (comment);Walker - 4 wheels;Bedside commode      Prior Function Level of Independence: Independent         Comments: has  been using rollator, Independent with IADLs, ADLs, and driving      Hand Dominance   Dominant Hand: Right    Extremity/Trunk Assessment   Upper Extremity Assessment Upper Extremity Assessment: Defer to OT evaluation    Lower Extremity  Assessment Lower Extremity Assessment: Generalized weakness    Cervical / Trunk Assessment Cervical / Trunk Assessment: Normal  Communication   Communication: No difficulties  Cognition Arousal/Alertness: Awake/alert Behavior During Therapy: WFL for tasks assessed/performed Overall Cognitive Status: Within Functional Limits for tasks assessed                                        General Comments General comments (skin integrity, edema, etc.): may be experiencing L horizontal canal BPPV- need vestibular specialist to examine    Exercises     Assessment/Plan    PT Assessment Patient needs continued PT services  PT Problem List Decreased activity tolerance;Decreased balance;Decreased mobility;Decreased knowledge of use of DME;Decreased safety awareness;Decreased knowledge of precautions;Cardiopulmonary status limiting activity       PT Treatment Interventions DME instruction;Gait training;Functional mobility training;Therapeutic activities;Therapeutic exercise;Balance training;Patient/family education;Stair training;Manual techniques;Neuromuscular re-education;Other (comment)(vestibular PT )    PT Goals (Current goals can be found in the Care Plan section)  Acute Rehab PT Goals Patient Stated Goal: to get home PT Goal Formulation: With patient Time For Goal Achievement: 04/01/18 Potential to Achieve Goals: Good    Frequency Min 3X/week   Barriers to discharge Decreased caregiver support      Co-evaluation               AM-PAC PT "6 Clicks" Daily Activity  Outcome Measure Difficulty turning over in bed (including adjusting bedclothes, sheets and blankets)?: None Difficulty moving from lying on back to sitting on the side of the bed? : None Difficulty sitting down on and standing up from a chair with arms (e.g., wheelchair, bedside commode, etc,.)?: A Little Help needed moving to and from a bed to chair (including a wheelchair)?: A Little Help  needed walking in hospital room?: A Little Help needed climbing 3-5 steps with a railing? : A Little 6 Click Score: 20    End of Session Equipment Utilized During Treatment: Gait belt;Oxygen Activity Tolerance: Patient tolerated treatment well Patient left: in bed;with bed alarm set;with call bell/phone within reach;Other (comment)(family and [place] tech present and attending ) Nurse Communication: Mobility status;Other (comment)(BPs, possible BPPV ) PT Visit Diagnosis: Unsteadiness on feet (R26.81);Muscle weakness (generalized) (M62.81);Dizziness and giddiness (R42);Other symptoms and signs involving the nervous system (R29.898)    Time: 1515-1600 PT Time Calculation (min) (ACUTE ONLY): 45 min   Charges:   PT Evaluation $PT Eval Moderate Complexity: 1 Mod PT Treatments $Therapeutic Activity: 8-22 mins $Self Care/Home Management: 8-22        Deniece Ree PT, DPT, CBIS  Supplemental Physical Therapist [facility]   Pager [phone]

## 2018-03-18 NOTE — ED Provider Notes (Signed)
[facility] EMERGENCY DEPARTMENT Provider Note  CSN: [id number] Arrival date & time: 03/18/18 0430  Chief Complaint(s) Dizziness and Nausea  HPI Saje Gallop Kemnitz is a 77 y.o. female with extensive past medical history listed below including hypertension, CHF with a EF of 30 to 35%, COPD on 4 L nasal cannula at home, prior hemorrhagic MCA stroke with left-sided deficits, vertigo who presents to the emergency department with lightheadedness/dizziness that began 8 PM yesterday evening.  Patient reports that she was fixing herself dinner when she went to go sit down, the lightheadedness/dizziness began suddenly.  It exacerbated with head movement and alleviated by being still. This morning her symptoms were severely exacerbated when she tried to get up to go to the bathroom due nausea. She endorses associated nausea without emesis.  She denies any associated chest pain or increased shortness of breath from baseline.  No abdominal pain or diarrhea.  Patient reports that during her recent admission, she was taken off her Lasix.  States that she had some abdominal distention and lower extremity edema 2 days ago and took 1 Lasix which took away the swelling.  Additionally she endorses 2 days of productive cough.  Denies any fevers or chills.  HPI  Past Medical History Past Medical History:  Diagnosis Date  . Arthritis   . CAD (coronary artery disease) 03/2009   s/p MI  . Chronic combined systolic and diastolic heart failure ([place]) 06/17/2009   Qualifier: Diagnosis of  By: Burt Knack, MD, Clayburn Pert   . CKD (chronic kidney disease) stage 3, GFR 30-59 ml/min (HCC)   . Emphysema/COPD ([place])   . GERD (gastroesophageal reflux disease)   . HCAP (healthcare-associated pneumonia) 03/19/2016  . Helicobacter pylori gastritis 10/2007   treated  . History of CVA (cerebrovascular accident) without residual deficits 01/2012, 06/2012   R hemorrhagic MCA 01/2012 with remote lacunar infarct L putamen  and IC, rpt 06/2012 acute multifocal R MCA infarct with remote hemorrhagic strokes affecting L basal ganglia and periventricular white matter, full recovery  . HLD (hyperlipidemia)   . HTN (hypertension)   . Ischemic cardiomyopathy 02/27/2014  . Lung cancer ([place]) dx'd 07/2006   Lung CA, s/p resection, followed by Dr Earlie Server  . Macular degeneration   . Myocardial infarction [facility]) 03/2009   Acute myocardial infarction 2010 - treated with BMS of LCx. LVEF 50% with subsequent CHF  . NSTEMI (non-ST elevated myocardial infarction) ([place])   . Osteoporosis 09/2013   T -3.6 forearm 09/2013, T -4.5 forearm 04/2015  . Pre-diabetes   . Syncope 02/24/2018   Patient Active Problem List   Diagnosis Date Noted  . Atrial fibrillation with rapid ventricular response ([place])   . Pressure injury of skin 02/25/2018  . Syncope 02/24/2018  . AF (paroxysmal atrial fibrillation) ([place]) 02/24/2018  . Chronic respiratory failure with hypoxia ([place]) 11/23/2017  . PVC's (premature ventricular contractions) 08/31/2017  . Skin rash 08/02/2017  . History of ST elevation myocardial infarction (STEMI) 07/06/2017  . Dyspnea 05/03/2017  . DNR (do not resuscitate) 12/07/2016  . Allergic rhinitis 07/31/2016  . Productive cough 06/29/2016  . Symptomatic bradycardia 04/01/2016  . Malnutrition of moderate degree 03/20/2016  . COPD (chronic obstructive pulmonary disease) ([place]) 03/19/2016  . Lumbar pain with radiation down left leg 03/02/2016  . Underweight 10/07/2015  . Vertigo 04/22/2015  . Skin nodule 04/22/2015  . Health maintenance examination 04/08/2015  . Advanced care planning/counseling discussion 04/08/2015  . Vitamin D insufficiency 02/02/2015  . CKD (chronic kidney disease)  stage 3, GFR 30-59 ml/min (HCC)   . History of CVA (cerebrovascular accident) without residual deficits   . Prediabetes   . Ischemic cardiomyopathy 02/27/2014  . Osteoporosis 09/24/2013  . Pulmonary nodules 10/26/2012  . History of TIA  (transient ischemic attack) 07/08/2012  . 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 Chronic combined systolic and diastolic heart failure ([place]) 06/17/2009  . HLD (hyperlipidemia) 04/24/2009  . CAD (coronary artery disease) 04/24/2009  . GERD 04/24/2009  . Macular degeneration (senile) of retina 04/23/2009  . Essential hypertension 04/23/2009  . Osteoarthritis 04/23/2009   Home Medication(s) Prior to Admission medications   Medication Sig Start Date End Date Taking? Authorizing Provider  albuterol (PROAIR HFA) 108 (90 Base) MCG/ACT inhaler Inhale 2 puffs into the lungs every 4 (four) hours as needed for wheezing or shortness of breath. 09/16/17  Yes Byrum, Rose Fillers, MD  albuterol (PROVENTIL) (2.5 MG/3ML) 0.083% nebulizer solution Take 3 mLs (2.5 mg total) by nebulization every 4 (four) hours as needed for wheezing or shortness of breath. 07/31/16  Yes Collene Gobble, MD  aspirin EC 81 MG tablet Take 1 tablet (81 mg total) by mouth daily. 02/26/16  Yes Jettie Booze E, NP  budesonide (PULMICORT) 0.5 MG/2ML nebulizer solution Take 2 mLs (0.5 mg total) by nebulization 2 (two) times daily. 03/07/18  Yes Byrum, Rose Fillers, MD  calcium carbonate (OS-CAL - DOSED IN MG OF ELEMENTAL CALCIUM) 1250 (500 Ca) MG tablet Take 1 tablet by mouth 2 (two) times daily with a meal.   Yes [provider]  clopidogrel (PLAVIX) 75 MG tablet Take 1 tablet (75 mg total) by mouth daily. 07/30/17 07/25/18 Yes Sherren Mocha, MD  denosumab (PROLIA) 60 MG/ML SOLN injection Inject 60 mg into the skin every 6 (six) months. Administer in upper arm, thigh, or abdomen 06/13/15  Yes Ria Bush, MD  fluticasone [facility]) 50 MCG/ACT nasal spray INSTILL 2 SPRAYS INTO BOTH NOSTRILS ONCEDAILY AS DIRECTED Patient taking differently: Place 2 sprays into both nostrils daily.  12/08/17  Yes Collene Gobble, MD  furosemide (LASIX) 20 MG tablet Take 20 mg by mouth as needed for fluid or edema.   Yes [provider]   isosorbide mononitrate (IMDUR) 30 MG 24 hr tablet TAKE 1 TABLET BY MOUTH DAILY Patient taking differently: Take 30 mg by mouth daily.  04/30/17  Yes Sherren Mocha, MD  loratadine (CLARITIN) 10 MG tablet Take 10 mg by mouth daily as needed for allergies or rhinitis.    Yes [provider]  LOTEMAX 0.5 % ophthalmic suspension Place 1 drop into both eyes daily.  12/27/13  Yes [provider]  nitroGLYCERIN (NITROSTAT) 0.4 MG SL tablet Place 1 tablet (0.4 mg total) under the tongue every 5 (five) minutes as needed for chest pain. 07/03/15  Yes Weaver, Scott T, PA-C  pantoprazole (PROTONIX) 40 MG tablet Take 1 tablet (40 mg total) by mouth 2 (two) times daily. 02/17/18  Yes Collene Gobble, MD  prednisoLONE acetate (PRED FORTE) 1 % ophthalmic suspension Place 1 drop into both eyes daily.    Yes [provider]  simvastatin (ZOCOR) 40 MG tablet TAKE 1 TABLET BY MOUTH AT BEDTIME Patient taking differently: Take 40 mg by mouth at bedtime.  10/06/17  Yes Sherren Mocha, MD  spironolactone (ALDACTONE) 25 MG tablet Take 0.5 tablets (12.5 mg total) by mouth daily. 02/27/18  Yes Georgette Shell, MD  Spacer/Aero-Holding Chambers (AEROCHAMBER MV) inhaler Use as instructed 12/28/16  Collene Gobble, MD                                                                                                                                    Past Surgical History Past Surgical History:  Procedure Laterality Date  . BREAST BIOPSY Left   . CARDIAC CATHETERIZATION N/A 02/24/2016   Procedure: Left Heart Cath and Coronary Angiography;  Surgeon: Belva Crome, MD;  Location: [place] CV LAB;  Service: Cardiovascular;  Laterality: N/A;  . CARDIAC CATHETERIZATION N/A 07/09/2016   Procedure: Left Heart Cath and Coronary Angiography;  Surgeon: Peter M Martinique, MD;  Location: [place] CV LAB;  Service: Cardiovascular;  Laterality: N/A;  . CARDIAC CATHETERIZATION N/A 07/09/2016   Procedure: Coronary  Stent Intervention;  Surgeon: Peter M Martinique, MD;  Location: [place] CV LAB;  Service: Cardiovascular;  Laterality: N/A;  . CATARACT EXTRACTION Bilateral   . CORONARY STENT INTERVENTION N/A 07/06/2017   Procedure: CORONARY STENT INTERVENTION;  Surgeon: Lorretta Harp, MD;  Location: [place] CV LAB;  Service: Cardiovascular;  Laterality: N/A;  . CORONARY STENT PLACEMENT  06/2016   DES to mid LAD  . CORONARY/GRAFT ACUTE MI REVASCULARIZATION N/A 07/06/2017   Procedure: Coronary/Graft Acute MI Revascularization;  Surgeon: Lorretta Harp, MD;  Location: [place] CV LAB;  Service: Cardiovascular;  Laterality: N/A;  . EYE SURGERY    . LEFT HEART CATH AND CORONARY ANGIOGRAPHY N/A 07/06/2017   Procedure: LEFT HEART CATH AND CORONARY ANGIOGRAPHY;  Surgeon: Lorretta Harp, MD;  Location: [place] CV LAB;  Service: Cardiovascular;  Laterality: N/A;  . LUNG REMOVAL, PARTIAL Right 2008  . PARTIAL HYSTERECTOMY  1986   irregular periods, ovaries remain   Family History Family History  Problem Relation Age of Onset  . CAD Mother 31       MI  . Stroke Mother   . Hypertension Mother   . Heart attack Mother   . CAD Father 22  . Stroke Father   . Breast cancer Sister   . Diabetes Sister     Social History Social History   Tobacco Use  . Smoking status: Former Smoker    Packs/day: 0.50    Years: 42.00    Pack years: 21.00    Types: Cigarettes    Last attempt to quit: 07/27/2002    Years since quitting: 15.6  . Smokeless tobacco: Never Used  Substance Use Topics  . Alcohol use: No    Alcohol/week: 0.0 standard drinks  . Drug use: No   Allergies Prednisone; Actonel [risedronate sodium]; Amlodipine; Brilinta [ticagrelor]; Codeine; Fosamax [alendronate sodium]; Lisinopril; and Pravastatin  Review of Systems Review of Systems All other systems are reviewed and are negative for acute change except as noted in the HPI  Physical Exam Vital Signs  I have reviewed the  triage vital signs BP (!) 123/57   Pulse 76   Temp 98  F (36.7 C) (Oral)   Resp 17   Ht 5\' 6"  (1.676 m)   Wt 57.6 kg   SpO2 100%   BMI 20.50 kg/m   Physical Exam  Constitutional: She is oriented to person, place, and time. She appears well-developed and well-nourished. No distress.  Thin and chronically ill-appearing  HENT:  Head: Normocephalic and atraumatic.  Nose: Nose normal.  Mouth/Throat: Abnormal dentition.  Eyes: Pupils are equal, round, and reactive to light. Conjunctivae and EOM are normal. Right eye exhibits no discharge. Left eye exhibits no discharge. No scleral icterus.  Neck: Normal range of motion. Neck supple.  Cardiovascular: Normal rate and regular rhythm. Exam reveals no gallop and no friction rub.  No murmur heard. Pulmonary/Chest: Effort normal and breath sounds normal. No stridor. No respiratory distress. She has no wheezes. She has no rhonchi. She has no rales.  Abdominal: Soft. She exhibits no distension. There is no tenderness.  Musculoskeletal: She exhibits no edema or tenderness.  No peripheral edema  Neurological: She is alert and oriented to person, place, and time.  Mental Status:  Alert and oriented to person, place, and time.  Attention and concentration normal.  Speech clear.  Recent memory is intact  Cranial Nerves:  II Visual Fields: Intact to confrontation. Visual fields intact. III, IV, VI: Pupils equal and reactive to light and near. Full eye movement without nystagmus  V Facial Sensation: Normal. No weakness of masticatory muscles  VII: No facial weakness or asymmetry  VIII Auditory Acuity: Grossly normal  IX/X: The uvula is midline; the palate elevates symmetrically  XI: Normal sternocleidomastoid and trapezius strength  XII: The tongue is midline. No atrophy or fasciculations.   Motor System: Muscle Strength: 4/5 LUE grip and flexion strength. 5/5 and symmetric in other extremities.   Muscle Tone: Tone and muscle bulk are normal  in the upper and lower extremities.   Reflexes: DTRs: 1+ and symmetrical in all four extremities. No Clonus Coordination:  No tremor.  Sensation: Intact to light touch, and pinprick.  Gait: deferred  HINTS Plus: Nystagmus: Unilateral nystagmus with fast beat to the right.  Head impulse: Abnormal Skew: Normal Hearing: Intact    Skin: Skin is warm and dry. No rash noted. She is not diaphoretic. No erythema.  Psychiatric: She has a normal mood and affect.  Vitals reviewed.   ED Results and Treatments Labs (all labs ordered are listed, but only abnormal results are displayed) Labs Reviewed  CBC WITH DIFFERENTIAL/PLATELET  COMPREHENSIVE METABOLIC PANEL  BRAIN NATRIURETIC PEPTIDE                                                                                                                         EKG  EKG Interpretation  Date/Time:  Friday March 18 2018 04:36:06 EDT Ventricular Rate:  76 PR Interval:    QRS Duration: 140 QT Interval:  414 QTC Calculation: 466 R Axis:   100 Text Interpretation:  Sinus rhythm Borderline short PR interval IVCD,  consider atypical LBBB No significant change since last tracing Confirmed by Addison Lank [phone]) on 03/18/2018 4:54:39 AM      Radiology Dg Chest 2 View  Result Date: 03/18/2018 CLINICAL DATA:  Cough.  Dizziness and nausea.  Shortness of breath. EXAM: CHEST - 2 VIEW COMPARISON:  Radiographs 02/24/2018.  PET-CT 01/25/2018 FINDINGS: Unchanged heart size and mediastinal contours. Aortic arch atherosclerosis. Development of diffuse reticular opacities consistent with pulmonary edema, slightly more prominent on the left. Small bilateral pleural effusions. Dependent right lung base opacity corresponds to pleural based mass on PET-CT. The left lower lobe pleural based mass on PET is not well visualized radiographically, tentatively seen on the lateral view. No pneumothorax. IMPRESSION: 1. Development of moderate pulmonary edema and small pleural  effusions since prior exam. 2. Pleural-based right and left lower lobe pulmonary masses on prior PET are faintly visualized radiographically. Electronically Signed   By: Jeb Levering M.D.   On: 03/18/2018 05:45   Pertinent labs & imaging results that were available during my care of the patient were reviewed by me and considered in my medical decision making (see chart for details).  Medications Ordered in ED Medications  sodium chloride 0.9 % bolus 250 mL (250 mLs Intravenous New Bag/Given 03/18/18 0547)  meclizine (ANTIVERT) tablet 25 mg (25 mg Oral Given 03/18/18 0546)  ondansetron (ZOFRAN) injection 4 mg (4 mg Intravenous Given 03/18/18 0546)                                                                                                                                    Procedures Procedures  (including critical care time)  Medical Decision Making / ED Course I have reviewed the nursing notes for this encounter and the patient's prior records (if available in EHR or on provided paperwork).  Clinical Course   1. Lightheadedness Borderline orthostatics. Hints exam reassuring for peripheral etiology. Baseline left sided deficits per patient. Doubt CVA.  Patient provided with small IVF (150cc), antiemetics, and Antivert resulting in improved symptomatology.  2. Cough History of CHF and COPD.  Afebrile.  Lungs clear.  Chest x-ray did reveal evidence of mild pulmonary edema; no evidence of PNA.  She is satting well on her 4 L nasal cannula.  No evidence of volume overload on exam.  EKG nonischemic.  Screening labs w/o leukocytosis, anemia, no significant electrolyte derangements, or renal insufficiency.  BNP pending.  However given the pulmonary edema on chest x-ray, will provide patient with 20 mg of IV Lasix and reassess with ambulation.  If patient's vertiginous symptoms have improved and she is able to ambulate without significant dyspnea on exertion or desaturations, I feel the patient  should be stable for discharge home with antivert and 20mg  daily Lasix.  If she is discharged, recommend close follow-up with cardiology for continued management of her congestive heart failure.  Patient care turned over to Dr Sedonia Small at [phone]. Patient case and results  discussed in detail; please see their note for further ED managment.    Final Clinical Impression(s) / ED Diagnoses Final diagnoses:  Cough  Acute on chronic systolic CHF (congestive heart failure) (HCC)  Vertigo      This chart was dictated using voice recognition software.  Despite best efforts to proofread,  errors can occur which can change the documentation meaning.   Fatima Blank, MD 03/18/18 [phone]

## 2018-03-18 NOTE — H&P (Signed)
History and Physical    [name] EUM:[id number] DOB: 16-Jan-1941 DOA: 03/18/2018  Referring MD/NP/PA: Sedonia Small PCP: Ria Bush, MD  Patient coming from: home  Chief Complaint: Dizziness  I have personally briefly reviewed patient's old medical records in [place]   HPI: [name] is a 77 y.o. female with medical history significant of HTN, HLD, CAD, combined systolic and diastolic CHF last EF 54-00%, A. fib, hemorrhagic CVA with residual deficits,  COPD on 2-4 L of oxygen, and vertigo; who initially presented with complaints of dizziness.  Last night around 8:30 PM the patient reported acute onset of her head stating.  She took a nap in her recliner when she woke up sometime around 3 AM reported that she was extremely nauseous, but did not vomit.  With any activity patient complains of dyspnea on exertion.  Associated symptoms included left nostril is always congested, chronic orthopnea patient sleeps in recliner at baseline, intermittent leg swelling, leg cramps, intermittent chest pain(chronic), and anxiety.  Family notes that patient is mostly a mouth breather due to issues with congestion that are only temporary improved with medications like Flonase.   She was recently admitted into the hospital on 8/1-8/4 for syncope.  During that admission patient was taken off scheduled daily Lasix and recommended to utilize as needed for lower extremity leg swelling.  Patient had taken Lasix this week every other day due to lower extremity swelling.  ED Course: Upon admission into the emergency department patient was noted to be afebrile, respirations 12-25, blood pressures 70/49-123/57, and O2 saturations maintained on home 4 L.  Labs significant for BNP of 317.9 and negative troponin.  Chest x-ray however showed moderate pulmonary edema with pleural base masses noted on previous PET scan.  Patient had been given initially 250 mL of normal saline IV fluids, Zofran, and meclizine as she  complained of nausea and dizziness.  However, after chest x-ray patient was given 20 mg of Lasix IV.  Patient has put out over a 2L of urine, but family still concerned as patient lives alone for her ability to care for herself.   Review of Systems  Constitutional: Negative for weight loss.  HENT: Positive for congestion. Negative for ear discharge and ear pain.   Eyes: Negative for pain and discharge.  Respiratory: Positive for shortness of breath. Negative for cough.   Cardiovascular: Positive for orthopnea (chronic) and leg swelling.  Gastrointestinal: Positive for nausea. Negative for vomiting.  Musculoskeletal: Positive for myalgias.  Neurological: Positive for dizziness. Negative for loss of consciousness.  Psychiatric/Behavioral: Negative for substance abuse. The patient is nervous/anxious.     Past Medical History:  Diagnosis Date  . Arthritis   . CAD (coronary artery disease) 03/2009   s/p MI  . Chronic combined systolic and diastolic heart failure ([place]) 06/17/2009   Qualifier: Diagnosis of  By: Burt Knack, MD, [place]burn Pert   . CKD (chronic kidney disease) stage 3, GFR 30-59 ml/min (HCC)   . Emphysema/COPD ([place])   . GERD (gastroesophageal reflux disease)   . HCAP (healthcare-associated pneumonia) 03/19/2016  . Helicobacter pylori gastritis 10/2007   treated  . History of CVA (cerebrovascular accident) without residual deficits 01/2012, 06/2012   R hemorrhagic MCA 01/2012 with remote lacunar infarct L putamen and IC, rpt 06/2012 acute multifocal R MCA infarct with remote hemorrhagic strokes affecting L basal ganglia and periventricular white matter, full recovery  . HLD (hyperlipidemia)   . HTN (hypertension)   . Ischemic cardiomyopathy 02/27/2014  .  Lung cancer ([place]) dx'd 07/2006   Lung CA, s/p resection, followed by Dr Earlie Server  . Macular degeneration   . Myocardial infarction [facility]) 03/2009   Acute myocardial infarction 2010 - treated with BMS of LCx. LVEF 50% with subsequent CHF    . NSTEMI (non-ST elevated myocardial infarction) ([place])   . Osteoporosis 09/2013   T -3.6 forearm 09/2013, T -4.5 forearm 04/2015  . Pre-diabetes   . Syncope 02/24/2018    Past Surgical History:  Procedure Laterality Date  . BREAST BIOPSY Left   . CARDIAC CATHETERIZATION N/A 02/24/2016   Procedure: Left Heart Cath and Coronary Angiography;  Surgeon: Belva Crome, MD;  Location: [place] [facility];  Service: Cardiovascular;  Laterality: N/A;  . CARDIAC CATHETERIZATION N/A 07/09/2016   Procedure: Left Heart Cath and Coronary Angiography;  Surgeon: Peter M Martinique, MD;  Location: [place] [facility];  Service: Cardiovascular;  Laterality: N/A;  . CARDIAC CATHETERIZATION N/A 07/09/2016   Procedure: Coronary Stent Intervention;  Surgeon: Peter M Martinique, MD;  Location: [place] [facility];  Service: Cardiovascular;  Laterality: N/A;  . CATARACT EXTRACTION Bilateral   . CORONARY STENT INTERVENTION N/A 07/06/2017   Procedure: CORONARY STENT INTERVENTION;  Surgeon: Lorretta Harp, MD;  Location: [place] [facility];  Service: Cardiovascular;  Laterality: N/A;  . CORONARY STENT PLACEMENT  06/2016   DES to mid LAD  . CORONARY/GRAFT ACUTE MI REVASCULARIZATION N/A 07/06/2017   Procedure: Coronary/Graft Acute MI Revascularization;  Surgeon: Lorretta Harp, MD;  Location: [place] [facility];  Service: Cardiovascular;  Laterality: N/A;  . EYE SURGERY    . LEFT HEART CATH AND CORONARY ANGIOGRAPHY N/A 07/06/2017   Procedure: LEFT HEART CATH AND CORONARY ANGIOGRAPHY;  Surgeon: Lorretta Harp, MD;  Location: [place] [facility];  Service: Cardiovascular;  Laterality: N/A;  . LUNG REMOVAL, PARTIAL Right 2008  . PARTIAL HYSTERECTOMY  1986   irregular periods, ovaries remain     reports that she quit smoking about 15 years ago. Her smoking use included cigarettes. She has a 21.00 pack-year smoking history. She has never used smokeless tobacco. She reports that she does not drink alcohol or use  drugs.  Allergies  Allergen Reactions  . Prednisone Other (See Comments)    Shaking (PILLS) Shot is ok  . Actonel [Risedronate Sodium] Other (See Comments)    Headache  . Amlodipine Other (See Comments)    Pedal edema  . Brilinta [Ticagrelor] Other (See Comments)    Worsening dyspnea, malaise  . Codeine Rash  . Fosamax [Alendronate Sodium] Other (See Comments)    Unable to tolerate  . Lisinopril Cough  . Pravastatin Other (See Comments)    Constipation.    Family History  Problem Relation Age of Onset  . CAD Mother 16       MI  . Stroke Mother   . Hypertension Mother   . Heart attack Mother   . CAD Father 61  . Stroke Father   . Breast cancer Sister   . Diabetes Sister     Prior to Admission medications   Medication Sig Start Date End Date Taking? Authorizing Provider  albuterol (PROAIR HFA) 108 (90 Base) MCG/ACT inhaler Inhale 2 puffs into the lungs every 4 (four) hours as needed for wheezing or shortness of breath. 09/16/17  Yes Byrum, Rose Fillers, MD  albuterol (PROVENTIL) (2.5 MG/3ML) 0.083% nebulizer solution Take 3 mLs (2.5 mg total) by nebulization every 4 (four) hours as needed for wheezing or  shortness of breath. 07/31/16  Yes Collene Gobble, MD  aspirin EC 81 MG tablet Take 1 tablet (81 mg total) by mouth daily. 02/26/16  Yes Jettie Booze E, NP  budesonide (PULMICORT) 0.5 MG/2ML nebulizer solution Take 2 mLs (0.5 mg total) by nebulization 2 (two) times daily. 03/07/18  Yes Byrum, Rose Fillers, MD  calcium carbonate (OS-CAL - DOSED IN MG OF ELEMENTAL CALCIUM) 1250 (500 Ca) MG tablet Take 1 tablet by mouth 2 (two) times daily with a meal.   Yes [provider]  clopidogrel (PLAVIX) 75 MG tablet Take 1 tablet (75 mg total) by mouth daily. 07/30/17 07/25/18 Yes Sherren Mocha, MD  denosumab (PROLIA) 60 MG/ML SOLN injection Inject 60 mg into the skin every 6 (six) months. Administer in upper arm, thigh, or abdomen 06/13/15  Yes Ria Bush, MD  fluticasone [facility])  50 MCG/ACT nasal spray INSTILL 2 SPRAYS INTO BOTH NOSTRILS ONCEDAILY AS DIRECTED Patient taking differently: Place 2 sprays into both nostrils daily.  12/08/17  Yes Collene Gobble, MD  furosemide (LASIX) 20 MG tablet Take 20 mg by mouth as needed for fluid or edema.   Yes [provider]  isosorbide mononitrate (IMDUR) 30 MG 24 hr tablet TAKE 1 TABLET BY MOUTH DAILY Patient taking differently: Take 30 mg by mouth daily.  04/30/17  Yes Sherren Mocha, MD  loratadine (CLARITIN) 10 MG tablet Take 10 mg by mouth daily as needed for allergies or rhinitis.    Yes [provider]  LOTEMAX 0.5 % ophthalmic suspension Place 1 drop into both eyes daily.  12/27/13  Yes [provider]  nitroGLYCERIN (NITROSTAT) 0.4 MG SL tablet Place 1 tablet (0.4 mg total) under the tongue every 5 (five) minutes as needed for chest pain. 07/03/15  Yes Weaver, Scott T, PA-C  pantoprazole (PROTONIX) 40 MG tablet Take 1 tablet (40 mg total) by mouth 2 (two) times daily. 02/17/18  Yes Collene Gobble, MD  prednisoLONE acetate (PRED FORTE) 1 % ophthalmic suspension Place 1 drop into both eyes daily.    Yes [provider]  simvastatin (ZOCOR) 40 MG tablet TAKE 1 TABLET BY MOUTH AT BEDTIME Patient taking differently: Take 40 mg by mouth at bedtime.  10/06/17  Yes Sherren Mocha, MD  spironolactone (ALDACTONE) 25 MG tablet Take 0.5 tablets (12.5 mg total) by mouth daily. 02/27/18  Yes Georgette Shell, MD  Spacer/Aero-Holding Chambers (AEROCHAMBER MV) inhaler Use as instructed 12/28/16   Collene Gobble, MD    Physical Exam:  Constitutional: Thin elderly female who appears chronically ill Vitals:   03/18/18 0815 03/18/18 0830 03/18/18 0845 03/18/18 0900  BP: 100/60  (!) 123/53 (!) 123/57  Pulse: 71 62 65 63  Resp: (!) 25 (!) 21 19 18   Temp:      TempSrc:      SpO2: 98% 98% 98% 99%  Weight:      Height:       Eyes: PERRL, lids and conjunctivae normal ENMT: Mucous membranes are moist.  Posterior pharynx clear of any exudate or lesions.  Neck: normal, supple, no masses, no thyromegaly Respiratory:  Positive crackles heard throughout both lung fields and prolonged exhalation.  No significant wheezes.  Patient talking in shortened sentences. Cardiovascular: Regular rate and rhythm, no murmurs / rubs / gallops. No extremity edema. 2+ pedal pulses. No carotid bruits.  Abdomen: no tenderness, no masses palpated. No hepatosplenomegaly. Bowel sounds positive.  Musculoskeletal: no clubbing / cyanosis. No joint deformity upper and lower extremities. Good  ROM, no contractures. Normal muscle tone.  Skin: no rashes, lesions, ulcers. No induration Neurologic: CN 2-12 grossly intact. Sensation intact, DTR normal. Strength 5/5 in all 4.  Psychiatric: Normal judgment and insight. Alert and oriented x 3. Normal mood.     Labs on Admission: I have personally reviewed following labs and imaging studies  CBC: Recent Labs  Lab 03/18/18 0515  WBC 6.3  NEUTROABS 3.6  HGB 12.6  HCT 38.7  MCV 96.8  PLT 093   Basic Metabolic Panel: Recent Labs  Lab 03/18/18 0515  NA 139  K 4.3  CL 107  CO2 25  GLUCOSE 109*  BUN 21  CREATININE 0.81  CALCIUM 8.7*   GFR: Estimated Creatinine Clearance: 52.9 mL/min (by C-G formula based on SCr of 0.81 mg/dL). Liver Function Tests: Recent Labs  Lab 03/18/18 0515  AST 23  ALT 22  ALKPHOS 64  BILITOT 0.5  PROT 5.9*  ALBUMIN 3.3*   No results for input(s): LIPASE, AMYLASE in the last 168 hours. No results for input(s): AMMONIA in the last 168 hours. Coagulation Profile: No results for input(s): INR, PROTIME in the last 168 hours. Cardiac Enzymes: No results for input(s): CKTOTAL, CKMB, CKMBINDEX, TROPONINI in the last 168 hours. BNP (last 3 results) No results for input(s): PROBNP in the last 8760 hours. HbA1C: No results for input(s): HGBA1C in the last 72 hours. CBG: No results for input(s): GLUCAP in the last 168 hours. Lipid  Profile: No results for input(s): CHOL, HDL, LDLCALC, TRIG, CHOLHDL, LDLDIRECT in the last 72 hours. Thyroid Function Tests: No results for input(s): TSH, T4TOTAL, FREET4, T3FREE, THYROIDAB in the last 72 hours. Anemia Panel: No results for input(s): VITAMINB12, FOLATE, FERRITIN, TIBC, IRON, RETICCTPCT in the last 72 hours. Urine analysis:    Component Value Date/Time   COLORURINE COLORLESS (A) 02/24/2018 1335   APPEARANCEUR CLEAR 02/24/2018 1335   LABSPEC 1.005 02/24/2018 1335   PHURINE 6.0 02/24/2018 1335   GLUCOSEU NEGATIVE 02/24/2018 1335   HGBUR NEGATIVE 02/24/2018 1335   BILIRUBINUR NEGATIVE 02/24/2018 1335   KETONESUR NEGATIVE 02/24/2018 1335   PROTEINUR NEGATIVE 02/24/2018 1335   UROBILINOGEN 0.2 05/05/2009 0353   NITRITE NEGATIVE 02/24/2018 1335   LEUKOCYTESUR NEGATIVE 02/24/2018 1335   Sepsis Labs: No results found for this or any previous visit (from the past 240 hour(s)).   Radiological Exams on Admission: Dg Chest 2 View  Result Date: 03/18/2018 CLINICAL DATA:  Cough.  Dizziness and nausea.  Shortness of breath. EXAM: CHEST - 2 VIEW COMPARISON:  Radiographs 02/24/2018.  PET-CT 01/25/2018 FINDINGS: Unchanged heart size and mediastinal contours. Aortic arch atherosclerosis. Development of diffuse reticular opacities consistent with pulmonary edema, slightly more prominent on the left. Small bilateral pleural effusions. Dependent right lung base opacity corresponds to pleural based mass on PET-CT. The left lower lobe pleural based mass on PET is not well visualized radiographically, tentatively seen on the lateral view. No pneumothorax. IMPRESSION: 1. Development of moderate pulmonary edema and small pleural effusions since prior exam. 2. Pleural-based right and left lower lobe pulmonary masses on prior PET are faintly visualized radiographically. Electronically Signed   By: Jeb [place] M.D.   On: 03/18/2018 05:45    EKG: Independently reviewed.  Sinus rhythm at 76  bpm  Assessment/Plan Pulmonary edema, chronic combined systolic and diastolic CHF: Patient presents with complaints of dizziness incidentally found to have moderate pulmonary edema on chest x-ray.  BNP actually improved when compared to previous values at 317.9.  Last EF noted to  be 30 to 35% in 06/2017. Patient had been given 20 mg of Lasix IV with significant diuresis of at least 2 L. - Admit to a telemetry bed - Heart failure orders set  initiated  - Strict I&Os and daily weights - Message sent for cardiology to help adjust medications - Continue spironolactone  - Reassess in a.m. and adjust diuresis as needed.  - Check echocardiogram - Physical therapy to eval and treat  Vertigo: Suspect symptoms could be related to previous history of vertigo.  Patient was given meclizine with quick relief of symptoms. - Continue meclizine as needed  Chronic respiratory failure with hypoxia, COPD: Chronic. - Continue home 4 L of oxygen - Continue duonebs 4 times daily and Pulmicort  Pulmonary nodules: Patient currently being followed by Dr. Lamonte Sakai for work-up and management of this problem.  Patient with recent PET scan showing - Continue outpatient follow-up  Allergic rhinitis: Patient with left nasal congestion only temporary relief Flonase.  Patient reports using it twice as previously advised during her appointment with pulmonology on 8/15. - Continue Flonase - Trial of hydroxyzine for congestion /anxiety  CAD  - Continue Plavix and isosorbide mononitrate  Hyperlipidemia - Continue simvastatin  H/O A. fib and CVA with residual deficits: Patient currently in sinus rhythm.  Not deemed a candidate for anticoagulation most likely related with history of hemorrhagic stroke.  GERD - Continue Protonix  DVT prophylaxis: Lovenox Code Status: DNR  Family Communication: Discussed plan of care with the patient family present bedside Disposition Plan: Likely discharge home in a.m. if medically  stable Consults called:   Admission status: observation  Norval Morton MD [facility] Pager [phone]   If 7PM-7AM, please contact night-coverage [email or web address] Password [facility]  03/18/2018, 10:33 AM

## 2018-03-18 NOTE — Care Management Note (Signed)
Case Management Note  Patient Details  Name: [name] MRN: [id number] Date of Birth: 1940-08-28  Subjective/Objective: CHF                  Action/Plan: 03/18/2018- Admitted x 2 in 6 months; patient is active with [facility] for [facility] services; CM will continue to follow for progression of care; B Pennie Rushing   02/25/2018 - Subjective/Objective:  Patient presented from home with syncope, SOB and nausea while walking dog. Patient with PMD: CVA, COPD, stage 3 CKD, HLD, lung CA, HTN and chronic combined HF.  Patient lives at home alone, verbalized having family support, independent with ADLs, occasionally ambulating with a rollator. Patient noncompliant with home O2 use. Patient reports only utilizing home O2 @ 2LPM at night, instead of continuous O2 usage as ordered by her Pulmonologist. PCP: Dr. Micah Flesher; Local Pharmacy: [place], [place]. N Gay RN, CM    Action/Plan: CM assessed patient for hospital transitional needs and PT/OT recommendations. Patient agreeable to HHRN/PT with [facility] patient's agency of choice, with [facility] liaison appraised of the referral. [facility] referral made in regards for home O2 needs, with [facility] to assess compliance and call in the home. Staff RN assessed increased need for liter flow, patient will require an additional O2 order with increased liter flow. Patient indicated her family will provide transportation at discharge. CM will continue to follow for hospital transitional needs. Midge Minium RN CM  Expected Discharge Date:     Possibly 03/22/2018             Expected Discharge Plan:  Home/Self Care  In-House Referral:  [facility]  Discharge planning Services  CM Consult  Choice offered to:  Patient  HH Arranged:  RN, Disease Management, PT [place] Agency:  [facility]  Status of Service:   In progress  Sherrilyn Rist [phone] 03/18/2018, 2:10 PM

## 2018-03-18 NOTE — Discharge Instructions (Addendum)
You were evaluated in the Emergency Department and after careful evaluation, we did not find any emergent condition requiring admission or further testing in the hospital.  Your symptoms today seem to be due to vertigo.  He also appear to be having a mild flare of your heart failure.  Please restart your Lasix at 20 mg once a day and follow-up closely with your primary care provider to discuss this medication and your more recent symptoms.  Please return to the Emergency Department if you experience any worsening of your condition.  We encourage you to follow up with a primary care provider.  Thank you for allowing [place] to be a part of your care.

## 2018-03-18 NOTE — Consult Note (Addendum)
Cardiology Consultation:   Patient ID: Chandi Nicklin Lindor; [id number]; 07/30/1940   Admit date: 03/18/2018 Date of Consult: 03/18/2018  Primary Care Provider: Ria Bush, MD Primary Cardiologist: Sherren Mocha, MD  Primary Electrophysiologist:  Dr. Curt Bears   Patient Profile:   Ellin Fitzgibbons Welte is a 77 y.o. female w/ complex medical history, CODE status DNR,with a hx of CAD s/p MI x3 with stenting of the LAD, LCx and known RCA disease, high burden PVCs but intolerant to beta blockers due to fatigue, history of lung cancer and emphysema limiting use of amiodarone, chronic hypoxic respiratory failure on chronic home O2 (baseline 4 L/min), possibility of recurrent lung CA based on recent CT scan, ischemic CM w/ EF of 30-35%, not a candidate for ICD, HTN, HLD, prediabetes, CKD, prior CVA, and recent hospitalization for syncope earlier this month,who is being seen today for acute on chronic systolic CHF, at the request of Dr. Tamala Julian, Internal Medicine.    History of Present Illness:   Satia Winger Rode is a 77 y.o. female w/ complex medical history, CODE status DNR,with a hx of CAD s/p MI x3 with stenting of the LAD, LCx and known RCA disease, high burden PVCs but intolerant to beta blockers due to fatigue, history of lung cancer and emphysema limiting use of amiodarone, chronic hypoxic respiratory failure on chronic home O2 (baseline 4 L/min), possibility of recurrent lung CA based on recent CT scan, ischemic CM w/ EF of 30-35%, not a candidate for ICD, HTN, HLD, prediabetes, CKD, prior CVA, and recent hospitalization for syncope earlier this month,who is being seen today for acute on chronic systolic CHF, at the request of Dr. Tamala Julian, Internal Medicine.   Ms.Groomshas a complex history. She initially presented with an acute MI in 2010 treated with bare-metal stenting of the left circumflex. She has had recurrent non-STEMI in 2017 when she was found to have severe stenosis in the mid LAD treated  with a drug-eluting stent. She presentedagain12/2018with an inferior STEMI and was found to have total occlusion of the distal right PDA. Attempts were made at aspiration and balloon angioplasty but flow was never able to be restored into the most distal part of the vessel. There was no high-grade proximal stenosis identified. She has had other problems including dizziness and bradycardia. She has frequent PVCs. She has chronic shortness of breath with a history of lung cancer and COPD. She is on chronic home O2.She is intolerant to Brilinta. This was discontinued in January and she was changed to Plavix. She has also been unable to tolerate beta blockers. She was on Coreg previously and this was discontinued. Also intolerant to Metoprolol. She isalsofollowed by Dr. Curt Bears for PVCs. Due to high burden and inability to tolerate beta blockers, he recommended she start mexiletine however ptnoncompliant. She also had side effects and discontinued after 1 week of therapy. It is mentioned in telephone encounters that Dr. Burt Knack advised avoidance of amiodarone due to her lung issues.  Also of note, pt had a recentPET scan on 7/2/19whichshowed left lower lobe nodule essentially stable but increased in metabolic activity favoring malignancy, right lower lobe mass slightly larger from the previous imaging. This is being followed by Dr. Lamonte Sakai.   On 02/24/18 pt came to the [facility] ED for syncope. Shewas out walking her dog morning of admitand suddenly became short of breath, then had syncope and collapse and woke up in the grass with her dog licking her face. Unwitnessed event.She denied any associated CP or palpitations, but admitted  that she was not wearing her supplemental O2 when she went for her walk. She was just a few steps from her door and was able to crawl in her house and call EMS.Per H&P that admit, EMS noted afib w/ RVR enroute to the ED, however there were no records to confirm this.  W/u  conducted and CT of head was negative. BG normal at 108. POC troponin negative. EKG showed SR w/ PVCs. UA negative. CBC unremarkable. Hgb 12.7. BMP unremarkable, SCr/BUN WNL. No signs of dehydration.K 4.6. Magnesium not checked.TSH normal. BNP elevated at 341.8. CXR showed right basilar density is noted which corresponds to possible mass and malignancy noted on prior PET scan. No other significant abnormality seen. Telemetry did show a 9 beat run of NSVT and frequent PVCs/ bigeminy.Code status was changed DNR.  Cardiology evaluated her during that hospitalization. It was felt that there were multiple possible etiologies for her syncopal episode including acute hypoxemic respiratory failure due to COPD exacerbation and/or ventricular arrhythmia given her severely depressed left ventricular systolic function/ischemic cardiomyopathy.   The Hospitalist also felt that lasix may have contributed to her syncope and discontinued it. She was advised to continue all other cardiac meds including spironolactone.   Unfortunately, there was not much more to offer her from a cardiac standpoint as she is intolerant to multiple medications as noted above. We did not recommend any additional cardiac w/u or therapies.  Pt was seen back in general cardiology clinic on 03/02/18 and denied any recurrent syncope. She had concerns about being off of Lasix for an extended period of time. Pt had endorsed her normal dry weight to be between 123-126 lb. Pt instructed to continue daily weights at home and to take PRN lasix if weight of 127 lb or more and to update our office.   On 03/14/18, pt sent patient e-mail through [facility], stating that she had gained weight and was taking extra lasix. Weight had increased to 136 lb on 03/10/18 but improved back to 127 lb with extra lasix. Pt advised by office nursing staff to continue to take 20 mg of lasix if weight of 127 lb or more.   Today, pt came to the [facility] ED with CC of exertional  dyspnea and LEE. Also nauseated but no vomiting. In ED, pt was maintaining stable O2 saturations on home O2 Level at 4L/min. BNP was abnormal at 317.9. CXR confirmed moderate pulmonary edema and small pleural effusion since prior exam. Pt was given a dose of IV Lasix, 20 mg in the ED, along with Zofran to help with nausea. All other home meds continued. She has been admitted by IM. Cardiology asked to assist with management of acute on chronic systolic CHF.      Past Medical History:  Diagnosis Date  . Arthritis   . CAD (coronary artery disease) 03/2009   s/p MI  . Chronic combined systolic and diastolic heart failure ([place]) 06/17/2009   Qualifier: Diagnosis of  By: Burt Knack, MD, [place] Pert   . CKD (chronic kidney disease) stage 3, GFR 30-59 ml/min (HCC)   . Emphysema/COPD ([place])   . GERD (gastroesophageal reflux disease)   . HCAP (healthcare-associated pneumonia) 03/19/2016  . Helicobacter pylori gastritis 10/2007   treated  . History of CVA (cerebrovascular accident) without residual deficits 01/2012, 06/2012   R hemorrhagic MCA 01/2012 with remote lacunar infarct L putamen and IC, rpt 06/2012 acute multifocal R MCA infarct with remote hemorrhagic strokes affecting L basal ganglia and periventricular white matter,  full recovery  . HLD (hyperlipidemia)   . HTN (hypertension)   . Ischemic cardiomyopathy 02/27/2014  . Lung cancer ([place]) dx'd 07/2006   Lung CA, s/p resection, followed by Dr Earlie Server  . Macular degeneration   . Myocardial infarction [facility]) 03/2009   Acute myocardial infarction 2010 - treated with BMS of LCx. LVEF 50% with subsequent CHF  . NSTEMI (non-ST elevated myocardial infarction) ([place])   . Osteoporosis 09/2013   T -3.6 forearm 09/2013, T -4.5 forearm 04/2015  . Pre-diabetes   . Syncope 02/24/2018    Past Surgical History:  Procedure Laterality Date  . BREAST BIOPSY Left   . CARDIAC CATHETERIZATION N/A 02/24/2016   Procedure: Left Heart Cath and Coronary Angiography;   Surgeon: Belva Crome, MD;  Location: [place] [facility];  Service: Cardiovascular;  Laterality: N/A;  . CARDIAC CATHETERIZATION N/A 07/09/2016   Procedure: Left Heart Cath and Coronary Angiography;  Surgeon: Peter M Martinique, MD;  Location: [place] [facility];  Service: Cardiovascular;  Laterality: N/A;  . CARDIAC CATHETERIZATION N/A 07/09/2016   Procedure: Coronary Stent Intervention;  Surgeon: Peter M Martinique, MD;  Location: [place] [facility];  Service: Cardiovascular;  Laterality: N/A;  . CATARACT EXTRACTION Bilateral   . CORONARY STENT INTERVENTION N/A 07/06/2017   Procedure: CORONARY STENT INTERVENTION;  Surgeon: Lorretta Harp, MD;  Location: [place] [facility];  Service: Cardiovascular;  Laterality: N/A;  . CORONARY STENT PLACEMENT  06/2016   DES to mid LAD  . CORONARY/GRAFT ACUTE MI REVASCULARIZATION N/A 07/06/2017   Procedure: Coronary/Graft Acute MI Revascularization;  Surgeon: Lorretta Harp, MD;  Location: [place] [facility];  Service: Cardiovascular;  Laterality: N/A;  . EYE SURGERY    . LEFT HEART CATH AND CORONARY ANGIOGRAPHY N/A 07/06/2017   Procedure: LEFT HEART CATH AND CORONARY ANGIOGRAPHY;  Surgeon: Lorretta Harp, MD;  Location: [place] [facility];  Service: Cardiovascular;  Laterality: N/A;  . LUNG REMOVAL, PARTIAL Right 2008  . PARTIAL HYSTERECTOMY  1986   irregular periods, ovaries remain     Home Medications:  Prior to Admission medications   Medication Sig Start Date End Date Taking? Authorizing Provider  albuterol (PROAIR HFA) 108 (90 Base) MCG/ACT inhaler Inhale 2 puffs into the lungs every 4 (four) hours as needed for wheezing or shortness of breath. 09/16/17  Yes Byrum, Rose Fillers, MD  albuterol (PROVENTIL) (2.5 MG/3ML) 0.083% nebulizer solution Take 3 mLs (2.5 mg total) by nebulization every 4 (four) hours as needed for wheezing or shortness of breath. 07/31/16  Yes Collene Gobble, MD  aspirin EC 81 MG tablet Take 1 tablet (81 mg total) by mouth daily.  02/26/16  Yes Jettie Booze E, NP  budesonide (PULMICORT) 0.5 MG/2ML nebulizer solution Take 2 mLs (0.5 mg total) by nebulization 2 (two) times daily. 03/07/18  Yes Byrum, Rose Fillers, MD  calcium carbonate (OS-CAL - DOSED IN MG OF ELEMENTAL CALCIUM) 1250 (500 Ca) MG tablet Take 1 tablet by mouth 2 (two) times daily with a meal.   Yes [provider]  clopidogrel (PLAVIX) 75 MG tablet Take 1 tablet (75 mg total) by mouth daily. 07/30/17 07/25/18 Yes Sherren Mocha, MD  denosumab (PROLIA) 60 MG/ML SOLN injection Inject 60 mg into the skin every 6 (six) months. Administer in upper arm, thigh, or abdomen 06/13/15  Yes Ria Bush, MD  fluticasone [facility]) 50 MCG/ACT nasal spray INSTILL 2 SPRAYS INTO BOTH NOSTRILS ONCEDAILY AS DIRECTED Patient taking differently: Place 2 sprays into both  nostrils daily.  12/08/17  Yes Collene Gobble, MD  furosemide (LASIX) 20 MG tablet Take 20 mg by mouth as needed for fluid or edema.   Yes [provider]  isosorbide mononitrate (IMDUR) 30 MG 24 hr tablet TAKE 1 TABLET BY MOUTH DAILY Patient taking differently: Take 30 mg by mouth daily.  04/30/17  Yes Sherren Mocha, MD  loratadine (CLARITIN) 10 MG tablet Take 10 mg by mouth daily as needed for allergies or rhinitis.    Yes [provider]  LOTEMAX 0.5 % ophthalmic suspension Place 1 drop into both eyes daily.  12/27/13  Yes [provider]  nitroGLYCERIN (NITROSTAT) 0.4 MG SL tablet Place 1 tablet (0.4 mg total) under the tongue every 5 (five) minutes as needed for chest pain. 07/03/15  Yes Weaver, Scott T, PA-C  pantoprazole (PROTONIX) 40 MG tablet Take 1 tablet (40 mg total) by mouth 2 (two) times daily. 02/17/18  Yes Collene Gobble, MD  prednisoLONE acetate (PRED FORTE) 1 % ophthalmic suspension Place 1 drop into both eyes daily.    Yes [provider]  simvastatin (ZOCOR) 40 MG tablet TAKE 1 TABLET BY MOUTH AT BEDTIME Patient taking differently: Take 40 mg by mouth at  bedtime.  10/06/17  Yes Sherren Mocha, MD  spironolactone (ALDACTONE) 25 MG tablet Take 0.5 tablets (12.5 mg total) by mouth daily. 02/27/18  Yes Georgette Shell, MD  Spacer/Aero-Holding Chambers (AEROCHAMBER MV) inhaler Use as instructed 12/28/16   Collene Gobble, MD    Inpatient Medications: Scheduled Meds: . aspirin EC  81 mg Oral Daily  . budesonide  0.5 mg Nebulization BID  . calcium carbonate  1 tablet Oral BID WC  . clopidogrel  75 mg Oral Daily  . enoxaparin (LOVENOX) injection  40 mg Subcutaneous QHS  . fluticasone  2 spray Each Nare Daily  . ipratropium-albuterol  3 mL Nebulization QID  . isosorbide mononitrate  30 mg Oral Daily  . loteprednol  1 drop Both Eyes Daily  . pantoprazole  40 mg Oral BID  . prednisoLONE acetate  1 drop Both Eyes Daily  . simvastatin  40 mg Oral QHS  . sodium chloride flush  3 mL Intravenous Q12H  . spironolactone  12.5 mg Oral Daily   Continuous Infusions: . sodium chloride     PRN Meds: sodium chloride, acetaminophen, albuterol, loratadine, meclizine, nitroGLYCERIN, ondansetron (ZOFRAN) IV, sodium chloride flush  Allergies:    Allergies  Allergen Reactions  . Prednisone Other (See Comments)    Shaking (PILLS) Shot is ok  . Actonel [Risedronate Sodium] Other (See Comments)    Headache  . Amlodipine Other (See Comments)    Pedal edema  . Brilinta [Ticagrelor] Other (See Comments)    Worsening dyspnea, malaise  . Codeine Rash  . Fosamax [Alendronate Sodium] Other (See Comments)    Unable to tolerate  . Lisinopril Cough  . Pravastatin Other (See Comments)    Constipation.    Social History:   Social History   Socioeconomic History  . Marital status: Widowed    Spouse name: Not on file  . Number of children: 1  . Years of education: Not on file  . Highest education level: Not on file  Occupational History  . Occupation: retired    Comment: insurance  Social Needs  . Financial resource strain: Not on file  . Food  insecurity:    Worry: Not on file    Inability: Not on file  . Transportation needs:  Medical: Not on file    Non-medical: Not on file  Tobacco Use  . Smoking status: Former Smoker    Packs/day: 0.50    Years: 42.00    Pack years: 21.00    Types: Cigarettes    Last attempt to quit: 07/27/2002    Years since quitting: 15.6  . Smokeless tobacco: Never Used  Substance and Sexual Activity  . Alcohol use: No    Alcohol/week: 0.0 standard drinks  . Drug use: No  . Sexual activity: Never  Lifestyle  . Physical activity:    Days per week: Not on file    Minutes per session: Not on file  . Stress: Not on file  Relationships  . Social connections:    Talks on phone: Not on file    Gets together: Not on file    Attends religious service: Not on file    Active member of club or organization: Not on file    Attends meetings of clubs or organizations: Not on file    Relationship status: Not on file  . Intimate partner violence:    Fear of current or ex partner: Not on file    Emotionally abused: Not on file    Physically abused: Not on file    Forced sexual activity: Not on file  Other Topics Concern  . Not on file  Social History Narrative   The patient is a widow and has one son and one dog.     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  She does not drink alcohol. 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   Ed: HS   Activity: no regular exercise   Diet: some water, some fruits/vegetables    Lives in a one story home.     Family History:    Family History  Problem Relation Age of Onset  . CAD Mother 30       MI  . Stroke Mother   . Hypertension Mother   . Heart attack Mother   . CAD Father 78  . Stroke Father   . Breast cancer Sister   . Diabetes Sister      ROS:  Please see the history of present illness.   All other ROS reviewed and negative.     Physical Exam/Data:   Vitals:   03/18/18 1100  03/18/18 1130 03/18/18 1200 03/18/18 1300  BP: (!) 110/97 128/78 (!) 114/54 (!) 111/91  Pulse: 62 63 (!) 57 71  Resp: 15 17 18 17   Temp:      TempSrc:      SpO2: 100% 98% 99% 95%  Weight:      Height:        Intake/Output Summary (Last 24 hours) at 03/18/2018 1414 Last data filed at 03/18/2018 0956 Gross per 24 hour  Intake 122.95 ml  Output 2200 ml  Net -2077.05 ml   Filed Weights   03/18/18 0442  Weight: 57.6 kg   Body mass index is 20.5 kg/m.  General:  Thin appearing, elderly WF in no acute distress HEENT: normal Lymph: no adenopathy Neck: no JVD Endocrine:  No thryomegaly Vascular: No carotid bruits; FA pulses 2+ bilaterally without bruits  Cardiac:  normal S1, S2; RRR; no murmur Lungs:  Decreased BS bilaterally Abd: soft, nontender, no hepatomegaly  Ext: no edema Musculoskeletal:  No deformities, BUE and BLE strength normal and equal Skin: warm and dry  Neuro:  CNs 2-12 intact, no focal abnormalities noted Psych:  Normal affect   EKG:  The EKG was personally reviewed and demonstrates:  Sinus rhythm Borderline short PR interval IVCD, consider atypical LBBB No significant change since last tracing  Telemetry:  Telemetry was personally reviewed and demonstrates:  NSR   Relevant CV Studies: 2D Echo 06/2017 Study Conclusions  - Left ventricle: The cavity size was mildly dilated. Wall thickness was normal. Systolic function was moderately to severely reduced. The estimated ejection fraction was in the range of 30% to 35%. Global hypokinesis with severe inferior wall hypokinesis to akinesis. The study is not technically sufficient to allow evaluation of LV diastolic function. - Aortic valve: Poorly visualized. There was no stenosis. Trace to mild regurgitation. - Mitral valve: Mildly thickened leaflets . Dilated annulus with posterior leaflet tethering. Mild to moderate regurgitation. - Left atrium: Moderately dilated. - Tricuspid valve: There  was moderate regurgitation. - Pulmonary arteries: PA peak pressure: 40 mm Hg (S). - Inferior vena cava: The vessel was dilated. The respirophasic diameter changes were blunted (<50%), consistent with elevated central venous pressure.  Impressions:  - Compared to a prior study in 06/2016, there are no significant changes.  LHC 07/07/17  Dist LAD lesion is 100% stenosed.  Ost 1st Diag lesion is 80% stenosed.  Previously placed Prox Cx to Mid Cx stent (unknown type) is widely patent.  RPDA lesion is 100% stenosed.  Prox LAD-1 lesion is 40% stenosed.  Previously placed Prox LAD-2 drug eluting stent is widely patent.  Balloon angioplasty was performed.  Post intervention, there is a 100% residual stenosis.  Holter 04/22/17 - personally reviewed The basic rhythm is sinus.  There are no significant bradyarrhythmias or pathologic pauses > 3 seconds There are frequent PVC's (22%), as well as short ventricular runs No atrial fibrillation or sustained tachyarrhythmias   Laboratory Data:  Chemistry Recent Labs  Lab 03/18/18 0515  NA 139  K 4.3  CL 107  CO2 25  GLUCOSE 109*  BUN 21  CREATININE 0.81  CALCIUM 8.7*  GFRNONAA >60  GFRAA >60  ANIONGAP 7    Recent Labs  Lab 03/18/18 0515  PROT 5.9*  ALBUMIN 3.3*  AST 23  ALT 22  ALKPHOS 64  BILITOT 0.5   Hematology Recent Labs  Lab 03/18/18 0515  WBC 6.3  RBC 4.00  HGB 12.6  HCT 38.7  MCV 96.8  MCH 31.5  MCHC 32.6  RDW 14.0  PLT 201   Cardiac Enzymes Recent Labs  Lab 03/18/18 1101  TROPONINI 0.04*    Recent Labs  Lab 03/18/18 0947  TROPIPOC 0.03    BNP Recent Labs  Lab 03/18/18 0515  BNP 317.9*    DDimer No results for input(s): DDIMER in the last 168 hours.  Radiology/Studies:  Dg Chest 2 View  Result Date: 03/18/2018 CLINICAL DATA:  Cough.  Dizziness and nausea.  Shortness of breath. EXAM: CHEST - 2 VIEW COMPARISON:  Radiographs 02/24/2018.  PET-CT 01/25/2018 FINDINGS:  Unchanged heart size and mediastinal contours. Aortic arch atherosclerosis. Development of diffuse reticular opacities consistent with pulmonary edema, slightly more prominent on the left. Small bilateral pleural effusions. Dependent right lung base opacity corresponds to pleural based mass on PET-CT. The left lower lobe pleural based mass on PET is not well visualized radiographically, tentatively seen on the lateral view. No pneumothorax. IMPRESSION: 1. Development of moderate pulmonary edema and small pleural effusions since prior exam. 2. Pleural-based right and left lower lobe pulmonary masses on  prior PET are faintly visualized radiographically. Electronically Signed   By: Jeb Levering M.D.   On: 03/18/2018 05:45    Assessment and Plan:   [name] is a 77 y.o. female w/ complex medical history, CODE status DNR,with a hx of CAD s/p MI x3 with stenting of the LAD, LCx and known RCA disease, high burden PVCs but intolerant to beta blockers due to fatigue, history of lung cancer and emphysema limiting use of amiodarone, chronic hypoxic respiratory failure on chronic home O2 (baseline 4 L/min), possibility of recurrent lung CA based on recent CT scan, ischemic CM w/ EF of 30-35%, not a candidate for ICD, HTN, HLD, prediabetes, CKD, prior CVA, and recent hospitalization for syncope earlier this month,who is being seen today for acute on chronic systolic CHF, at the request of Dr. Tamala Julian, Internal Medicine.   Pt admitted roughly 3 weeks ago for syncope. Troponins negative and CT of head negative. Potential etiologies included acute hypoxemic respiratory failure due to COPD exacerbation (pt had syncope while outside walking dog w/o her supplemental O2) and/or ventricular arrhythmia given her severely depressed left ventricular systolic function/ischemic cardiomyopathy.  The Hospitalist also felt that lasix may have contributed to her syncope and discontinued it.  Since that time, she has only been  taking Lasix PRN based on weight gain. Dry weight ~123-126 lb. Pt instructed to take if weight of 127 lb or greater.  She now presents with pulmonary edema/ acute on chronic systolic HF. BNP abnormal at 341.8 and CXR also demonstrated moderate pulmonary edema and small pleural effusion since prior exam. Agree with treatment with IV diuretics. She is feeling better after initial dose and does not appear to be grossly volume overloaded on exam. Reassess volume status in the AM and give addition dose of IV Lasix if needed. Transition back to PO if stable. I think she would benefit from continuation of daily scheduled PO lasix as opposed to PRN, to reduce risk of recurrent hospitalization for CHF. I think her recent syncope was more likely triggered by acute hypoxemic respiratory failure (pt had syncope while outside walking dog w/o her supplemental O2) and less likely from hypovolemia/ dehydration from daily diuretic use. Her labs and BP previous admit were not c/w dehydration. She would probably do better on 20 mg of lasix daily w/ additional lasix if weight exceeds 127 lb.   As noted previously, medical management of her heart conditions has been limited due to multiple med intolerances. Unable to tolerate BBs due to history of marked fatigue with this class of medications. She is not on an ACE nor ARB. Renal function is ok, but she developed a cough with lisinopril previously. Addition of a low dose ARB may be beneficial, if BP allows, but may need to discuss with pulmonology given the severity of her lung issues. She is on low dose spironolactone. Current BP will likely not tolerate addition of nitrates and hydralazine. She is not a candidate for ICD given her multiple comorbidities. MD to follow with further recommendations.   Dispo: family expresses concerns about pt's social situation and inability to properly care for herself independently at home. Pt lives home alone and has 1 son but he is estranged.  Recommend PT/ Education officer, museum evaluate for nursing facility placement. Perhaps palliative care consult.      For questions or updates, please contact [place] Please consult [email or web address] for contact info under Cardiology/STEMI.   Signed, Lyda Jester, PA-C  03/18/2018 2:14 PM   Attending  Note:   The patient was seen and examined.  Agree with assessment and plan as noted above.  Changes made to the above note as needed.  Patient seen and independently examined with Ellen Henri, PA .   We discussed all aspects of the encounter. I agree with the assessment and plan as stated above.  1.  Acute on chronic combined systolic and diastolic congestive heart failure: Ilana presents with recurrent congestive heart failure.  She had been taken off her Lasix because of the worry that it may have contributed to her episode of syncope.  She feels much better after receiving 1 dose of IV Lasix.  She is basically euvolemic at this time.  Neri images of the echocardiogram reveals mild to moderate LV dysfunction with an EF of around 40 to 45%.  The echo will be officially read later today.  At this point I think we should resume her home dose of Lasix 20 mg a day.  Continue spironolactone.  Many of her healthcare issues are also complicated by her social issues.  Is been very difficult for the family to get someone to come in and help care for her.   I have spent a total of 40 minutes with patient reviewing hospital  notes , telemetry, EKGs, labs and examining patient as well as establishing an assessment and plan that was discussed with the patient. > 50% of time was spent in direct patient care.    Thayer Headings, Brooke Bonito., MD, [facility] 03/18/2018, 4:27 PM [address],  [place] Pager [phone]

## 2018-03-18 NOTE — ED Notes (Signed)
Lunch tray ordered 

## 2018-03-18 NOTE — ED Notes (Signed)
Troponin 0.04, MD paged.

## 2018-03-18 NOTE — ED Notes (Signed)
Patient transported to X-ray 

## 2018-03-18 NOTE — ED Notes (Signed)
Await [facility] to bring o2 so pt can go home, pt on home o2 ar 4 lnc

## 2018-03-19 DIAGNOSIS — J810 Acute pulmonary edema: Secondary | ICD-10-CM

## 2018-03-19 LAB — BASIC METABOLIC PANEL
Anion gap: 4 — ABNORMAL LOW (ref 5–15)
BUN: 26 mg/dL — ABNORMAL HIGH (ref 8–23)
CHLORIDE: 105 mmol/L (ref 98–111)
CO2: 29 mmol/L (ref 22–32)
CREATININE: 0.84 mg/dL (ref 0.44–1.00)
Calcium: 8.5 mg/dL — ABNORMAL LOW (ref 8.9–10.3)
GFR calc non Af Amer: >60 mL/min (ref >60)
Glucose, Bld: 113 mg/dL — ABNORMAL HIGH (ref 70–99)
Potassium: 4.3 mmol/L (ref 3.5–5.1)
Sodium: 138 mmol/L (ref 135–145)

## 2018-03-19 LAB — CBC WITH DIFFERENTIAL/PLATELET
ABS IMMATURE GRANULOCYTES: 0 10*3/uL (ref 0.0–0.1)
Basophils Absolute: 0.1 10*3/uL (ref 0.0–0.1)
Basophils Relative: 1 %
EOS ABS: 0.5 10*3/uL (ref 0.0–0.7)
Eosinophils Relative: 7 %
HEMATOCRIT: 33.9 % — AB (ref 36.0–46.0)
Hemoglobin: 11.3 g/dL — ABNORMAL LOW (ref 12.0–15.0)
IMMATURE GRANULOCYTES: 0 %
LYMPHS ABS: 1.7 10*3/uL (ref 0.7–4.0)
Lymphocytes Relative: 22 %
MCH: 32.2 pg (ref 26.0–34.0)
MCHC: 33.3 g/dL (ref 30.0–36.0)
MCV: 96.6 fL (ref 78.0–100.0)
Monocytes Absolute: 0.8 10*3/uL (ref 0.1–1.0)
Monocytes Relative: 11 %
NEUTROS ABS: 4.4 10*3/uL (ref 1.7–7.7)
NEUTROS PCT: 59 %
Platelets: 204 10*3/uL (ref 150–400)
RBC: 3.51 MIL/uL — ABNORMAL LOW (ref 3.87–5.11)
RDW: 14.3 % (ref 11.5–15.5)
WBC: 7.5 10*3/uL (ref 4.0–10.5)

## 2018-03-19 LAB — TROPONIN I: Troponin I: 0.03 ng/mL (ref <0.03)

## 2018-03-19 NOTE — Progress Notes (Signed)
PROGRESS NOTE    [name]  OBS:[id number] DOB: [birth year] DOA: 03/18/2018 PCP: Ria Bush, MD   Brief Narrative:  [name] is a 77 y.o. 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 fib, hemorrhagic CVA with residual deficits,  COPD on 2-4 L of oxygen, and vertigo; who initially presented with complaints of dizziness.  Last night around 8:30 PM the patient reported acute onset of her head stating.  She took a nap in her recliner when she woke up sometime around 3 AM reported that she was extremely nauseous, but did not vomit.  With any activity patient complains of dyspnea on exertion.  Associated symptoms included left nostril is always congested, chronic orthopnea patient sleeps in recliner at baseline, intermittent leg swelling, leg cramps, intermittent chest pain(chronic), and anxiety.  Family notes that patient is mostly a mouth breather due to issues with congestion that are only temporary improved with medications like Flonase.   She was recently admitted into the hospital on 8/1-8/4 for syncope.  During that admission patient was taken off scheduled daily Lasix and recommended to utilize as needed for lower extremity leg swelling.  Patient had taken Lasix this week every other day due to lower extremity swelling.  Assessment & Plan:   Principal Problem:   Pulmonary edema Active Problems:   HLD (hyperlipidemia)   Acute on chronic combined systolic and diastolic CHF (congestive heart failure) (HCC)   History of CVA (cerebrovascular accident) without residual deficits   Vertigo   COPD (chronic obstructive pulmonary disease) (HCC)   Allergic rhinitis   Chronic respiratory failure with hypoxia (HCC)   Pulmonary edema, chronic combined systolic and diastolic CHF: Patient presents with complaints of dizziness incidentally found to have moderate pulmonary edema on chest x-ray.  BNP actually improved when  compared to previous values at 317.9.  Last EF noted to be 30 to 35% in 06/2017. Patient had been given 20 mg of Lasix IV with significant diuresis of at least 2 L. - Cardiology c/s, appreciate recs -> recommended continue aldactone and lasix, f/u outpatient with Dr. Burt Knack, now signed off - continue I/O, daily weights - Check echocardiogram (EF 30-35%, see report)  - Physical therapy to eval and treat  Vertigo:  Considered neuroimaging, but suspect this is BPPV with her improvement with epley maneuver today by PT.    - Continue meclizine as needed - Vestibular rehab  Chronic respiratory failure with hypoxia, COPD: Chronic. - Continue home 4 L of oxygen - Continue duonebs 4 times daily and Pulmicort - Stable, follow  Pulmonary nodules: Patient currently being followed by Dr. Lamonte Sakai for work-up and management of this problem.   - Continue outpatient follow-up  Allergic rhinitis: Patient with left nasal congestion only temporary relief Flonase.  Patient reports using it twice as previously advised during her appointment with pulmonology on 8/15. - Continue Flonase - Trial of hydroxyzine for congestion /anxiety  CAD  - Continue Plavix and isosorbide mononitrate - no CP  Hyperlipidemia - Continue simvastatin  H/O A. fib and CVA with residual deficits: Patient currently in sinus rhythm.  Per chart, has not been deemed good candidate for anticoagulation.  GERD - Continue Protonix    DVT prophylaxis: lovenox Code Status: DNR Family Communication: sister in law at bedside Disposition Plan: pending SNF placement.  Pt not safe to d/c home based on discussion with sister in law and PT.  Pt lives alone and has functional  limitations with vertigo, balance, and endurance.  Sister in law notes no family she could live with, very worried about her safety home alone.   Consultants:   Cardiology  Procedures:  Study Conclusions  - Left ventricle: The cavity size was mildly  dilated. Systolic   function was moderately to severely reduced. The estimated   ejection fraction was in the range of 30% to 35%. Akinesis and   scarring of the entireinferolateral myocardium; in the   distribution of the left circumflex coronary artery. Doppler   parameters are consistent with abnormal left ventricular   relaxation (grade 1 diastolic dysfunction). - Mitral valve: Calcified annulus. There was mild regurgitation. - Left atrium: The atrium was mildly dilated.  Impressions:  - No evidence of left ventricular thrombus is seen, but the apex   was suboptimally visualized. Recommend repeat limited Definity   study if there is suspicion for cardioembolic stroke.  Antimicrobials:  Anti-infectives (From admission, onward)   None         Subjective: C/o dizziness, improving.  Objective: Vitals:   03/19/18 1250 03/19/18 1537 03/19/18 1607 03/19/18 1658  BP:  (!) 122/59  105/61  Pulse:  70  79  Resp:    20  Temp:    98.2 F (36.8 C)  TempSrc:    Oral  SpO2: 98%  98% 97%  Weight:      Height:        Intake/Output Summary (Last 24 hours) at 03/19/2018 1729 Last data filed at 03/19/2018 1635 Gross per 24 hour  Intake 480 ml  Output 2025 ml  Net -1545 ml   Filed Weights   03/18/18 0442 03/18/18 1450 03/19/18 0357  Weight: 57.6 kg 58.1 kg 58.5 kg    Examination:  General exam: Appears calm and comfortable  Respiratory system: Clear to auscultation. Respiratory effort normal. Cardiovascular system: S1 & S2 heard, RRR. No JVD, murmurs, rubs, gallops or clicks. No pedal edema. Gastrointestinal system: Abdomen is nondistended, soft and nontender. No organomegaly or masses felt. Normal bowel sounds heard. Central nervous system: Alert and oriented. No focal neurological deficits.  CN 2-12 intact.  FNF intact.  Heel to shin intact.   Extremities: Symmetric 5 x 5 power. Skin: No rashes, lesions or ulcers Psychiatry: Judgement and insight appear normal. Mood &  affect appropriate.     Data Reviewed: I have personally reviewed following labs and imaging studies  CBC: Recent Labs  Lab 03/18/18 0515 03/18/18 2259  WBC 6.3 7.5  NEUTROABS 3.6 4.4  HGB 12.6 11.3*  HCT 38.7 33.9*  MCV 96.8 96.6  PLT 201 741   Basic Metabolic Panel: Recent Labs  Lab 03/18/18 0515 03/18/18 1101 03/18/18 2259  NA 139  --  138  K 4.3  --  4.3  CL 107  --  105  CO2 25  --  29  GLUCOSE 109*  --  113*  BUN 21  --  26*  CREATININE 0.81  --  0.84  CALCIUM 8.7*  --  8.5*  MG  --  1.9  --    GFR: Estimated Creatinine Clearance: 51.8 mL/min (by C-G formula based on SCr of 0.84 mg/dL). Liver Function Tests: Recent Labs  Lab 03/18/18 0515  AST 23  ALT 22  ALKPHOS 64  BILITOT 0.5  PROT 5.9*  ALBUMIN 3.3*   No results for input(s): LIPASE, AMYLASE in the last 168 hours. No results for input(s): AMMONIA in the last 168 hours. Coagulation Profile: No results for input(s):  INR, PROTIME in the last 168 hours. Cardiac Enzymes: Recent Labs  Lab 03/18/18 1101 03/18/18 1712 03/18/18 2259  TROPONINI 0.04* 0.04* 0.03*   BNP (last 3 results) No results for input(s): PROBNP in the last 8760 hours. HbA1C: No results for input(s): HGBA1C in the last 72 hours. CBG: No results for input(s): GLUCAP in the last 168 hours. Lipid Profile: No results for input(s): CHOL, HDL, LDLCALC, TRIG, CHOLHDL, LDLDIRECT in the last 72 hours. Thyroid Function Tests: No results for input(s): TSH, T4TOTAL, FREET4, T3FREE, THYROIDAB in the last 72 hours. Anemia Panel: No results for input(s): VITAMINB12, FOLATE, FERRITIN, TIBC, IRON, RETICCTPCT in the last 72 hours. Sepsis Labs: No results for input(s): PROCALCITON, LATICACIDVEN in the last 168 hours.  No results found for this or any previous visit (from the past 240 hour(s)).       Radiology Studies: Dg Chest 2 View  Result Date: 03/18/2018 CLINICAL DATA:  Cough.  Dizziness and nausea.  Shortness of breath. EXAM:  CHEST - 2 VIEW COMPARISON:  Radiographs 02/24/2018.  PET-CT 01/25/2018 FINDINGS: Unchanged heart size and mediastinal contours. Aortic arch atherosclerosis. Development of diffuse reticular opacities consistent with pulmonary edema, slightly more prominent on the left. Small bilateral pleural effusions. Dependent right lung base opacity corresponds to pleural based mass on PET-CT. The left lower lobe pleural based mass on PET is not well visualized radiographically, tentatively seen on the lateral view. No pneumothorax. IMPRESSION: 1. Development of moderate pulmonary edema and small pleural effusions since prior exam. 2. Pleural-based right and left lower lobe pulmonary masses on prior PET are faintly visualized radiographically. Electronically Signed   By: Jeb Levering M.D.   On: 03/18/2018 05:45        Scheduled Meds: . aspirin EC  81 mg Oral Daily  . budesonide  0.5 mg Nebulization BID  . calcium carbonate  1 tablet Oral BID WC  . clopidogrel  75 mg Oral Daily  . enoxaparin (LOVENOX) injection  40 mg Subcutaneous QHS  . fluticasone  2 spray Each Nare Daily  . furosemide  20 mg Oral Daily  . ipratropium-albuterol  3 mL Nebulization QID  . isosorbide mononitrate  30 mg Oral Daily  . loteprednol  1 drop Both Eyes Daily  . pantoprazole  40 mg Oral BID  . prednisoLONE acetate  1 drop Both Eyes Daily  . simvastatin  40 mg Oral QHS  . sodium chloride flush  3 mL Intravenous Q12H  . spironolactone  12.5 mg Oral Daily   Continuous Infusions: . sodium chloride       LOS: 0 days    Time spent: over 30 min    Fayrene Helper, MD [facility] Pager [phone]  If 7PM-7AM, please contact night-coverage [email or web address] Password [facility] 03/19/2018, 5:29 PM

## 2018-03-19 NOTE — Progress Notes (Addendum)
Physical Therapy Treatment Patient Details Name: [name] [place] MRN: [id number] DOB: 04/20/1941 Today's Date: 03/19/2018    History of Present Illness 77yo female presenting with dizziness, nausea with urge to vomit, DOE. Note recent admission in early August for syncope. Diagnosed with acute pulmonary edema, vertigo. PMH CAD, CAD, CHF, CKD, emphysema/COPD, CVA, HTN, lung CA, macular degeneration, MI, NSTEMI, syncope, partial lung removal, cardiac cath     PT Comments    Pt admitted with above diagnosis. Pt currently with functional limitations due to vertigo, balance and endurance deficits. Pt was positive for left horizontal canal BPPV and treated with BBQ roll.  Pt reports decr symptoms since she had treatment.  Pt lives alone and ultimately would benefit from SNF but if she does not qualify, maximize HH therapies.  Pt would be a great  Candidate for Home First program if she qualifies.  Will follow acutely.  Pt will benefit from skilled PT to increase their independence and safety with mobility to allow discharge to the venue listed below.     Follow Up Recommendations  Home health PT vestibular rehab, HHOT, HHAide, HHSW, HHRN;Supervision/Assistance - 24 hour(If no 24 hour care, SNF is best option/vestibular rehab)     Equipment Recommendations  Lightweight portable O2 tank    Recommendations for Other Services       Precautions / Restrictions Precautions Precautions: Fall Restrictions Weight Bearing Restrictions: No    Mobility  Bed Mobility Overal bed mobility: Needs Assistance Bed Mobility: Supine to Sit;Sit to Supine     Supine to sit: Supervision;HOB elevated Sit to supine: Supervision;HOB elevated   General bed mobility comments: supervision for safety, no phyiscal assist required  Transfers Overall transfer level: Needs assistance Equipment used: Rolling walker (2 wheeled);None Transfers: Sit to/from Stand Sit to Stand: Min guard         General transfer  comment: increased effort, min guard for safety   Ambulation/Gait Ambulation/Gait assistance: Min guard Gait Distance (Feet): 40 Feet(20 feet x 2) Assistive device: Rolling walker (2 wheeled);None Gait Pattern/deviations: Step-through pattern;Decreased stride length;Decreased dorsiflexion - right;Decreased dorsiflexion - left;Trunk flexed;Drifts right/left   Gait velocity interpretation: <1.31 ft/sec, indicative of household ambulator General Gait Details: Pt generally reaching for furniture or therapist as she is not steady without UE support.  SpO2 remained at 96% on 4LPM O2 which she uses at baseline    Chief Strategy Officer    Modified Rankin (Stroke Patients Only)       Balance Overall balance assessment: Needs assistance Sitting-balance support: Feet supported Sitting balance-Leahy Scale: Good     Standing balance support: Bilateral upper extremity supported;During functional activity;Single extremity supported Standing balance-Leahy Scale: Poor Standing balance comment: reliant on 1 UE support during standing                             Cognition Arousal/Alertness: Awake/alert Behavior During Therapy: WFL for tasks assessed/performed Overall Cognitive Status: Within Functional Limits for tasks assessed                                        Exercises Other Exercises Other Exercises: forced prolonged positioning.     General Comments General comments (skin integrity, edema, etc.): Pt with positive supine head roll test for left horizontal canal BPPV.  BBQ roll  completed and within 10 min, pt reports that she can focus eyes better and within 20 min, even better.  Hopeful that treatment was effective.  Also instructed pt and gave handout for forced prolonged positioning for her to do tonight if symptoms persist.       Pertinent Vitals/Pain Pain Assessment: No/denies pain    Home Living                       Prior Function            PT Goals (current goals can now be found in the care plan section) Acute Rehab PT Goals Patient Stated Goal: to get home Progress towards PT goals: Progressing toward goals    Frequency    Min 3X/week      PT Plan Current plan remains appropriate    Co-evaluation              AM-PAC PT "6 Clicks" Daily Activity  Outcome Measure  Difficulty turning over in bed (including adjusting bedclothes, sheets and blankets)?: None Difficulty moving from lying on back to sitting on the side of the bed? : None Difficulty sitting down on and standing up from a chair with arms (e.g., wheelchair, bedside commode, etc,.)?: A Little Help needed moving to and from a bed to chair (including a wheelchair)?: A Little Help needed walking in hospital room?: A Little Help needed climbing 3-5 steps with a railing? : A Little 6 Click Score: 20    End of Session Equipment Utilized During Treatment: Gait belt;Oxygen Activity Tolerance: Patient tolerated treatment well Patient left: in bed;with bed alarm set;with call bell/phone within reach;with family/visitor present Nurse Communication: Mobility status PT Visit Diagnosis: Unsteadiness on feet (R26.81);Muscle weakness (generalized) (M62.81);Dizziness and giddiness (R42);Other symptoms and signs involving the nervous system (R29.898)     Time: 0762-2633 PT Time Calculation (min) (ACUTE ONLY): 53 min  Charges:  $Gait Training: 8-22 mins $Therapeutic Exercise: 8-22 mins $Therapeutic Activity: 8-22 mins $Self Care/Home Management: 8-22                     [facility] Acute Rehabilitation [phone] (pager)    Denice Paradise 03/19/2018, 12:16 PM

## 2018-03-19 NOTE — Progress Notes (Signed)
Progress Note  Patient Name: [name] Date of Encounter: 03/19/2018  Primary Cardiologist: Sherren Mocha, MD   Subjective   No cardiac complaints much improved with diuretics   Inpatient Medications    Scheduled Meds: . aspirin EC  81 mg Oral Daily  . budesonide  0.5 mg Nebulization BID  . calcium carbonate  1 tablet Oral BID WC  . clopidogrel  75 mg Oral Daily  . enoxaparin (LOVENOX) injection  40 mg Subcutaneous QHS  . fluticasone  2 spray Each Nare Daily  . furosemide  20 mg Oral Daily  . ipratropium-albuterol  3 mL Nebulization QID  . isosorbide mononitrate  30 mg Oral Daily  . loteprednol  1 drop Both Eyes Daily  . pantoprazole  40 mg Oral BID  . prednisoLONE acetate  1 drop Both Eyes Daily  . simvastatin  40 mg Oral QHS  . sodium chloride flush  3 mL Intravenous Q12H  . spironolactone  12.5 mg Oral Daily   Continuous Infusions: . sodium chloride     PRN Meds: sodium chloride, acetaminophen, albuterol, loratadine, meclizine, nitroGLYCERIN, ondansetron (ZOFRAN) IV, sodium chloride flush   Vital Signs    Vitals:   03/19/18 0751 03/19/18 0753 03/19/18 0756 03/19/18 0936  BP: 96/81   117/66  Pulse: 60   66  Resp:      Temp:      TempSrc:      SpO2:  99% 99%   Weight:      Height:       No intake or output data in the 24 hours ending 03/19/18 1000 Filed Weights   03/18/18 0442 03/18/18 1450 03/19/18 0357  Weight: 57.6 kg 58.1 kg 58.5 kg    Telemetry    NSR 03/19/2018  - Personally Reviewed  ECG    SR IVCD no acute changes  - Personally Reviewed  Physical Exam  Frail elderly white female  GEN: No acute distress.   Neck: No JVD Cardiac: RRR, no murmurs, rubs, or gallops.  Respiratory: Clear to auscultation bilaterally. GI: Soft, nontender, non-distended  MS: No edema; No deformity. Neuro:  Nonfocal  Psych: Normal affect   Labs    Chemistry Recent Labs  Lab 03/18/18 0515 03/18/18 2259  NA 139 138  K 4.3 4.3  CL 107 105  CO2 25  29  GLUCOSE 109* 113*  BUN 21 26*  CREATININE 0.81 0.84  CALCIUM 8.7* 8.5*  PROT 5.9*  --   ALBUMIN 3.3*  --   AST 23  --   ALT 22  --   ALKPHOS 64  --   BILITOT 0.5  --   GFRNONAA >60 >60  GFRAA >60 >60  ANIONGAP 7 4*     Hematology Recent Labs  Lab 03/18/18 0515 03/18/18 2259  WBC 6.3 7.5  RBC 4.00 3.51*  HGB 12.6 11.3*  HCT 38.7 33.9*  MCV 96.8 96.6  MCH 31.5 32.2  MCHC 32.6 33.3  RDW 14.0 14.3  PLT 201 204    Cardiac Enzymes Recent Labs  Lab 03/18/18 1101 03/18/18 1712 03/18/18 2259  TROPONINI 0.04* 0.04* 0.03*    Recent Labs  Lab 03/18/18 0947  TROPIPOC 0.03     BNP Recent Labs  Lab 03/18/18 0515  BNP 317.9*     DDimer No results for input(s): DDIMER in the last 168 hours.   Radiology    Dg Chest 2 View  Result Date: 03/18/2018 CLINICAL DATA:  Cough.  Dizziness and nausea.  Shortness  of breath. EXAM: CHEST - 2 VIEW COMPARISON:  Radiographs 02/24/2018.  PET-CT 01/25/2018 FINDINGS: Unchanged heart size and mediastinal contours. Aortic arch atherosclerosis. Development of diffuse reticular opacities consistent with pulmonary edema, slightly more prominent on the left. Small bilateral pleural effusions. Dependent right lung base opacity corresponds to pleural based mass on PET-CT. The left lower lobe pleural based mass on PET is not well visualized radiographically, tentatively seen on the lateral view. No pneumothorax. IMPRESSION: 1. Development of moderate pulmonary edema and small pleural effusions since prior exam. 2. Pleural-based right and left lower lobe pulmonary masses on prior PET are faintly visualized radiographically. Electronically Signed   By: Jeb Levering M.D.   On: 03/18/2018 05:45    Cardiac Studies   TTE EF 30-35% no change form December 2018  Patient Profile     77 y.o. female DNR CAD, emphysema on chronic home oxygen ? Recurrent lung CA and chronic CHF.   Assessment & Plan    CHF:  Improved on diuretic that had been  held for w/u synocpe last month but this was Likely non cardiac / vertigo  Continue aldactone and lasix orally  Will arrange outpatient f/u with Dr Burt Knack   [facility] will sign off.   Medication Recommendations:    Other recommendations (labs, testing, etc):  Follow BMET on diuretics  Follow up as an outpatient:  With Dr Burt Knack   For questions or updates, please contact [place] Please consult [email or web address] for contact info under Cardiology/STEMI.      Signed, Jenkins Rouge, MD  03/19/2018, 10:00 AM

## 2018-03-19 NOTE — Progress Notes (Signed)
Pt refused IV site to be flushed and stated that it hurts whenever it is flushed but she is refusing to get it removed or get another site. Family member at bedside at this time

## 2018-03-19 NOTE — Progress Notes (Signed)
PA cardiology aware of pt vtach  Will continue to monitor  PA stated to call if pt symptomatic or greater than 20 beats   Paged MD Florene Glen requesting to give meclizine PRN dose early  MD aware last dose received at 0752, stated okay to give early

## 2018-03-19 NOTE — Progress Notes (Signed)
Per CCMD pt had 5 beats of vtach, strip saved  RN was at bedside at time of call, pt non symptomatic  Text page sent to [facility] cardiology

## 2018-03-19 NOTE — Progress Notes (Signed)
Pt had 7 beats of Vtach  Vital signs documented, pt denies pain  Paged cardiology PA to inform

## 2018-03-19 NOTE — NC FL2 (Signed)
[place] LEVEL OF CARE SCREENING TOOL     IDENTIFICATION  Patient Name: [name] Birthdate: 03/29/1941 Sex: female Admission Date (Current Location): 03/18/2018  [facility] and [place] Number:  Herbalist and Address:  [facility], [place] [address] [place], [place] [address]      Provider Number: [id number]  Attending Physician Name and Address:  Elodia Florence., *  Relative Name and Phone Number:  [phone] [name]    Current Level of Care: Hospital Recommended Level of Care: [place] Prior Approval Number:    Date Approved/Denied:   PASRR Number:    Discharge Plan: SNF    Current Diagnoses: Patient Active Problem List   Diagnosis Date Noted  . Pulmonary edema 03/18/2018  . Atrial fibrillation with rapid ventricular response ([place])   . Pressure injury of skin 02/25/2018  . Syncope 02/24/2018  . AF (paroxysmal atrial fibrillation) ([place]) 02/24/2018  . Chronic respiratory failure with hypoxia ([place]) 11/23/2017  . PVC's (premature ventricular contractions) 08/31/2017  . Skin rash 08/02/2017  . History of ST elevation myocardial infarction (STEMI) 07/06/2017  . Dyspnea 05/03/2017  . DNR (do not resuscitate) 12/07/2016  . Allergic rhinitis 07/31/2016  . Productive cough 06/29/2016  . Symptomatic bradycardia 04/01/2016  . Malnutrition of moderate degree 03/20/2016  . COPD (chronic obstructive pulmonary disease) ([place]) 03/19/2016  . Lumbar pain with radiation down left leg 03/02/2016  . Underweight 10/07/2015  . Vertigo 04/22/2015  . Skin nodule 04/22/2015  . Health maintenance examination 04/08/2015  . Advanced care planning/counseling discussion 04/08/2015  . Vitamin D insufficiency 02/02/2015  . CKD (chronic kidney disease) stage 3, GFR 30-59 ml/min (HCC)   . History of CVA (cerebrovascular accident) without residual deficits   . Prediabetes   . Ischemic cardiomyopathy 02/27/2014  .  Osteoporosis 09/24/2013  . Pulmonary nodules 10/26/2012  . History of TIA (transient ischemic attack) 07/08/2012  . Cancer of lower lobe of right lung ([place]) 04/21/2012  . Acute on chronic combined systolic and diastolic CHF (congestive heart failure) ([place]) 06/17/2009  . HLD (hyperlipidemia) 04/24/2009  . CAD (coronary artery disease) 04/24/2009  . GERD 04/24/2009  . Macular degeneration (senile) of retina 04/23/2009  . Essential hypertension 04/23/2009  . Osteoarthritis 04/23/2009    Orientation RESPIRATION BLADDER Height & Weight     Self, Time, Situation, Place  O2, Other (Comment)(Nasal Cannula) Incontinent Weight: 128 lb 15.5 oz (58.5 kg)(Bed) Height:  5\' 6"  (167.6 cm)  BEHAVIORAL SYMPTOMS/MOOD NEUROLOGICAL BOWEL NUTRITION STATUS      Continent Diet(Heart Healthy, Thin Fluid)  AMBULATORY STATUS COMMUNICATION OF NEEDS Skin   Limited Assist Verbally Other (Comment)(Stage I Sacrum Wound)                       Personal Care Assistance Level of Assistance  Bathing, Feeding, Dressing Bathing Assistance: Limited assistance Feeding assistance: Limited assistance Dressing Assistance: Limited assistance     Functional Limitations Info  Sight, Hearing, Speech Sight Info: Adequate Hearing Info: Adequate Speech Info: Adequate    SPECIAL CARE FACTORS FREQUENCY  PT (By licensed PT), OT (By licensed OT)     PT Frequency: 3x OT Frequency: 3x            Contractures Contractures Info: Not present    Additional Factors Info  Code Status Code Status Info: DNR             Current Medications (03/19/2018):  This is the  current hospital active medication list Current Facility-Administered Medications  Medication Dose Route Frequency Provider Last Rate Last Dose  . 0.9 %  sodium chloride infusion  250 mL Intravenous PRN Fuller Plan A, MD      . acetaminophen (TYLENOL) tablet 650 mg  650 mg Oral Q4H PRN Smith, Rondell A, MD      . albuterol (PROVENTIL) (2.5 MG/3ML)  0.083% nebulizer solution 2.5 mg  2.5 mg Nebulization Q4H PRN Fuller Plan A, MD      . aspirin EC tablet 81 mg  81 mg Oral Daily Tamala Julian, Rondell A, MD   81 mg at 03/19/18 0931  . budesonide (PULMICORT) nebulizer solution 0.5 mg  0.5 mg Nebulization BID Fuller Plan A, MD   0.5 mg at 03/19/18 0752  . calcium carbonate (OS-CAL - dosed in mg of elemental calcium) tablet 500 mg of elemental calcium  1 tablet Oral BID WC Smith, Rondell A, MD   500 mg of elemental calcium at 03/19/18 0752  . clopidogrel (PLAVIX) tablet 75 mg  75 mg Oral Daily Fuller Plan A, MD   75 mg at 03/19/18 0931  . enoxaparin (LOVENOX) injection 40 mg  40 mg Subcutaneous QHS Fuller Plan A, MD   40 mg at 03/18/18 2104  . fluticasone (FLONASE) 50 MCG/ACT nasal spray 2 spray  2 spray Each Nare Daily Fuller Plan A, MD   2 spray at 03/19/18 0932  . furosemide (LASIX) tablet 20 mg  20 mg Oral Daily Lyda Jester M, PA-C   20 mg at 03/19/18 0931  . ipratropium-albuterol (DUONEB) 0.5-2.5 (3) MG/3ML nebulizer solution 3 mL  3 mL Nebulization QID Tamala Julian, Rondell A, MD   3 mL at 03/19/18 1247  . isosorbide mononitrate (IMDUR) 24 hr tablet 30 mg  30 mg Oral Daily Tamala Julian, Rondell A, MD   30 mg at 03/19/18 0931  . loratadine (CLARITIN) tablet 10 mg  10 mg Oral Daily PRN Fuller Plan A, MD      . loteprednol (LOTEMAX) 0.5 % ophthalmic suspension 1 drop  1 drop Both Eyes Daily Smith, Rondell A, MD      . meclizine (ANTIVERT) tablet 25 mg  25 mg Oral BID PRN Fuller Plan A, MD   25 mg at 03/19/18 0752  . nitroGLYCERIN (NITROSTAT) SL tablet 0.4 mg  0.4 mg Sublingual Q5 min PRN Fuller Plan A, MD      . ondansetron (ZOFRAN) injection 4 mg  4 mg Intravenous Q6H PRN Smith, Rondell A, MD      . pantoprazole (PROTONIX) EC tablet 40 mg  40 mg Oral BID Fuller Plan A, MD   40 mg at 03/19/18 0931  . prednisoLONE acetate (PRED FORTE) 1 % ophthalmic suspension 1 drop  1 drop Both Eyes Daily Fuller Plan A, MD   1 drop at 03/19/18 0932   . simvastatin (ZOCOR) tablet 40 mg  40 mg Oral QHS Fuller Plan A, MD   40 mg at 03/18/18 2105  . sodium chloride flush (NS) 0.9 % injection 3 mL  3 mL Intravenous Q12H Smith, Rondell A, MD   3 mL at 03/19/18 0936  . sodium chloride flush (NS) 0.9 % injection 3 mL  3 mL Intravenous PRN Smith, Rondell A, MD      . spironolactone (ALDACTONE) tablet 12.5 mg  12.5 mg Oral Daily Tamala Julian, Rondell A, MD   12.5 mg at 03/19/18 8841     Discharge Medications: Please see discharge summary for a list of discharge medications.  Relevant Imaging Results:  Relevant Lab Results:   Additional Information [id number]  [facility], LCSW

## 2018-03-19 NOTE — Clinical Social Work Note (Signed)
Clinical Social Work Assessment  Patient Details  Name: [name] MRN: [id number] Date of Birth: Feb 25, 1941  Date of referral:  03/19/18               Reason for consult:  Discharge Planning                Permission sought to share information with:  Facility Art therapist granted to share information::  Yes, Verbal Permission Granted  Name::        Agency::     Relationship::  Sister  Contact Information:  [name] [phone]  Housing/Transportation Living arrangements for the past 2 months:  Apartment Source of Information:  Patient, Siblings Patient Interpreter Needed:  None Criminal Activity/Legal Involvement Pertinent to Current Situation/Hospitalization:  No - Comment as needed Significant Relationships:  Siblings, Other Family Members Lives with:  Self Do you feel safe going back to the place where you live?  No Need for family participation in patient care:  Yes (Comment)  Care giving concerns:  CSW received consult regarding SNF placement. CSW spoke with pt & sister. Pt resides at home. Pt and sister would like for pt to reside at SNF until pt is stronger. CSW to continue to follow and assist w/ discharge planning needs.    Social Worker assessment / plan:  CSW spoke with pt and pt sister regarding SNF placement. Pt and sister agreeable for placement.   Employment status:  Retired Forensic scientist:  Managed Care PT Recommendations:  [place] / Referral to community resources:  [place]  Patient/Family's Response to care:  Patient and patient's daughter reports agreement with discharge plan.   Patient/Family's Understanding of and Emotional Response to Diagnosis, Current Treatment, and Prognosis:  Pt/sister is realistic regarding therapy needs and expressed being hopeful for SNF placement. Pt and sister expressed understanding of CSW role and discharge process as well as medical condition. No  questions or concerns about plan or treatment.   Emotional Assessment Appearance:  Appears stated age, Developmentally appropriate Attitude/Demeanor/Rapport:  Self-Confident, Engaged Affect (typically observed):  Blunt Orientation:  Oriented to Self, Oriented to Situation, Oriented to Place, Oriented to  Time Alcohol / Substance use:  Not Applicable Psych involvement (Current and /or in the community):  No (Comment)  Discharge Needs  Concerns to be addressed:  Discharge Planning Concerns Readmission within the last 30 days:  No Current discharge risk:  None Barriers to Discharge:  No Barriers Identified   Rahkim Rabalais, LCSW 03/19/2018, 3:29 PM

## 2018-03-19 NOTE — Progress Notes (Signed)
CSW started initial SNF search for placement. CSW attempted to contact pt insurance ([employer]). Left multiple voicemail for nurse on call to callback to start insurance authorization. Will await callback and continue to assist pt in locating facility for short-term rehab.

## 2018-03-19 NOTE — Progress Notes (Signed)
MD Nishan aware of vtach, no new orders will continue to monitor   Set pt oxygen up on humidifier and ordered an incentive spirometer

## 2018-03-20 DIAGNOSIS — Z8673 Personal history of transient ischemic attack (TIA), and cerebral infarction without residual deficits: Secondary | ICD-10-CM | POA: Diagnosis not present

## 2018-03-20 DIAGNOSIS — E785 Hyperlipidemia, unspecified: Secondary | ICD-10-CM | POA: Diagnosis not present

## 2018-03-20 DIAGNOSIS — J449 Chronic obstructive pulmonary disease, unspecified: Secondary | ICD-10-CM | POA: Diagnosis not present

## 2018-03-20 DIAGNOSIS — J810 Acute pulmonary edema: Secondary | ICD-10-CM | POA: Diagnosis not present

## 2018-03-20 DIAGNOSIS — J301 Allergic rhinitis due to pollen: Secondary | ICD-10-CM | POA: Diagnosis not present

## 2018-03-20 DIAGNOSIS — R42 Dizziness and giddiness: Secondary | ICD-10-CM | POA: Diagnosis not present

## 2018-03-20 DIAGNOSIS — I5043 Acute on chronic combined systolic (congestive) and diastolic (congestive) heart failure: Secondary | ICD-10-CM | POA: Diagnosis not present

## 2018-03-20 DIAGNOSIS — J9611 Chronic respiratory failure with hypoxia: Secondary | ICD-10-CM

## 2018-03-20 LAB — BASIC METABOLIC PANEL
ANION GAP: 7 (ref 5–15)
BUN: 17 mg/dL (ref 8–23)
CALCIUM: 9.1 mg/dL (ref 8.9–10.3)
CO2: 29 mmol/L (ref 22–32)
Chloride: 102 mmol/L (ref 98–111)
Creatinine, Ser: 0.81 mg/dL (ref 0.44–1.00)
GFR calc Af Amer: >60 mL/min (ref >60)
GLUCOSE: 96 mg/dL (ref 70–99)
Potassium: 4.2 mmol/L (ref 3.5–5.1)
Sodium: 138 mmol/L (ref 135–145)

## 2018-03-20 LAB — CBC
HCT: 35.8 % — ABNORMAL LOW (ref 36.0–46.0)
Hemoglobin: 12 g/dL (ref 12.0–15.0)
MCH: 32.2 pg (ref 26.0–34.0)
MCHC: 33.5 g/dL (ref 30.0–36.0)
MCV: 96 fL (ref 78.0–100.0)
PLATELETS: 208 10*3/uL (ref 150–400)
RBC: 3.73 MIL/uL — ABNORMAL LOW (ref 3.87–5.11)
RDW: 13.7 % (ref 11.5–15.5)
WBC: 6.1 10*3/uL (ref 4.0–10.5)

## 2018-03-20 LAB — MAGNESIUM: MAGNESIUM: 1.9 mg/dL (ref 1.7–2.4)

## 2018-03-20 NOTE — Progress Notes (Signed)
Per CCMD pt had 13 beats of Vtach  Pt non symptomatic  Vital signs documented  Paged cardiology PA to inform

## 2018-03-20 NOTE — Progress Notes (Signed)
Update provided to pt family at bedside per pt request   Set pt up for dinner

## 2018-03-20 NOTE — Progress Notes (Signed)
PROGRESS NOTE    [name]  GNF:[id number] DOB: 1940-10-24 DOA: 03/18/2018 PCP: Ria Bush, MD   Brief Narrative:  [name] is a 77 y.o. female with medical history significant of HTN, HLD, CAD, combined systolic and diastolic CHF last EF 84-69%, A. fib, hemorrhagic CVA with residual deficits,  COPD on 2-4 L of oxygen, and vertigo; who initially presented with complaints of dizziness.  Last night around 8:30 PM the patient reported acute onset of her head stating.  She took a nap in her recliner when she woke up sometime around 3 AM reported that she was extremely nauseous, but did not vomit.  With any activity patient complains of dyspnea on exertion.  Associated symptoms included left nostril is always congested, chronic orthopnea patient sleeps in recliner at baseline, intermittent leg swelling, leg cramps, intermittent chest pain(chronic), and anxiety.  Family notes that patient is mostly a mouth breather due to issues with congestion that are only temporary improved with medications like Flonase.   She was recently admitted into the hospital on 8/1-8/4 for syncope.  During that admission patient was taken off scheduled daily Lasix and recommended to utilize as needed for lower extremity leg swelling.  Patient had taken Lasix this week every other day due to lower extremity swelling.  Assessment & Plan: Principal Problem:   Pulmonary edema Active Problems:   HLD (hyperlipidemia)   Acute on chronic combined systolic and diastolic CHF (congestive heart failure) (HCC)   History of CVA (cerebrovascular accident) without residual deficits   Vertigo   COPD (chronic obstructive pulmonary disease) (HCC)   Allergic rhinitis   Chronic respiratory failure with hypoxia (HCC)  Pulmonary edema, chronic combined systolic and diastolic CHF: Patient presents with complaints of dizziness incidentally found to have moderate pulmonary edema on chest x-ray.  BNP actually improved when  compared to previous values at 317.9.  Last EF noted to be 30 to 35% in 06/2017, stable on echo during this admission. Patient had been given 20 mg of Lasix IV with significant diuresis of at least 2 L. - Cardiology c/s, appreciate recs -> recommended continue aldactone and lasix, f/u outpatient with Dr. Burt Knack, now signed off - continue I/O, daily weights  BPPV: Most likely with improvement following Epley maneuver.  - Continue meclizine prn continues to require this and is not steady on her feet.  - Needs vestibular rehabilitation.   Chronic respiratory failure with hypoxia, COPD: No exacerbation - Continue home 4 L of oxygen - Continue duonebs 4 times daily and Pulmicort  Pulmonary nodules: Patient currently being followed by Dr. Lamonte Sakai for work-up and management of this problem.   - Continue outpatient follow-up  Allergic rhinitis: Patient with left nasal congestion only temporary relief Flonase.  Patient reports using it twice as previously advised during her appointment with pulmonology on 8/15. - Continue Flonase - Continue trial of hydroxyzine for congestion /anxiety  CAD: No anginal symptoms - Continue Plavix and isosorbide mononitrate  Hyperlipidemia - Continue simvastatin  H/O A. fib and CVA with residual deficits: Patient currently in sinus rhythm.  Per chart, has not been deemed good candidate for anticoagulation.  GERD - Continue Protonix  DVT prophylaxis: Lovenox Code Status: DNR Family Communication: Brother at bedside Disposition Plan: Awaiting SNF placement. Brother at bedside echoes sentiments per previous MD: Pt not safe to d/c home based on discussion with sister in law and PT.  Pt lives alone and has functional limitations with vertigo, balance, and endurance.  Sister in Sports coach  notes no family she could live with, very worried about her safety home alone.  Consultants:   Cardiology  Procedures:  Echo - Left ventricle: The cavity size was mildly  dilated. Systolic   function was moderately to severely reduced. The estimated   ejection fraction was in the range of 30% to 35%. Akinesis and   scarring of the entireinferolateral myocardium; in the   distribution of the left circumflex coronary artery. Doppler   parameters are consistent with abnormal left ventricular   relaxation (grade 1 diastolic dysfunction). - Mitral valve: Calcified annulus. There was mild regurgitation. - Left atrium: The atrium was mildly dilated.  Impressions: - No evidence of left ventricular thrombus is seen, but the apex   was suboptimally visualized. Recommend repeat limited Definity   study if there is suspicion for cardioembolic stroke.  Antimicrobials:  None  Subjective: Dizziness improved, getting around with assistance. Brother at bedside is pt's only assistance, pt lives alone, and he cannot be there more than a couple hours a day. He reports some memory impairment over past few months.   Objective: Vitals:   03/20/18 0841 03/20/18 1138 03/20/18 1218 03/20/18 1505  BP: (!) 100/50  133/62   Pulse: (!) 59  66   Resp:      Temp:   (!) 97.5 F (36.4 C)   TempSrc:   Oral   SpO2:  99%  99%  Weight:      Height:        Intake/Output Summary (Last 24 hours) at 03/20/2018 1603 Last data filed at 03/20/2018 1544 Gross per 24 hour  Intake 1200 ml  Output 2800 ml  Net -1600 ml   Filed Weights   03/18/18 1450 03/19/18 0357 03/20/18 0440  Weight: 58.1 kg 58.5 kg 56.8 kg    Examination: Gen: 77 y.o. female in no distress Pulm: Nonlabored breathing. Clear. CV: Regular rate and rhythm. No murmur, rub, or gallop. No JVD, no dependent edema. GI: Abdomen soft, non-tender, non-distended, with normoactive bowel sounds.  Ext: Warm, no deformities Skin: No rashes, lesions or ulcers on visualized skin.  Neuro: Alert and oriented. No focal neurological deficits. Psych: Judgement and insight appear fair. Mood euthymic & affect congruent. Behavior is  appropriate.    Time spent: 25 min  Patrecia Pour, MD [facility] Pager [phone]  If 7PM-7AM, please contact night-coverage [email or web address] Password [facility] 03/20/2018, 4:03 PM

## 2018-03-20 NOTE — Progress Notes (Signed)
Pt had 6 beats of Vtach per CCMD  Pt non symptomatic

## 2018-03-20 NOTE — Progress Notes (Signed)
Physical Therapy Treatment Patient Details Name: [name] MRN: [id number] DOB: February 14, 1941 Today's Date: 03/20/2018    History of Present Illness 77yo female presenting with dizziness, nausea with urge to vomit, DOE. Note recent admission in early August for syncope. Diagnosed with acute pulmonary edema, vertigo. PMH CAD, CAD, CHF, CKD, emphysema/COPD, CVA, HTN, lung CA, macular degeneration, MI, NSTEMI, syncope, partial lung removal, cardiac cath     PT Comments    Patient doing well this session, reports dizziness has subsided and she has not had any vertigo today. Re-tested canals for BPPV, all definitively negative with no nystagmus noted. 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 Pt and family left with clear understanding and have no further questions. Advised if vertigo were to ever come back to get a proper evaluation/diagnoses again.   Follow Up Recommendations  Home health PT;Supervision/Assistance - 24 hour((SNF if no 24 hour care avail))     Equipment Recommendations  None recommended by PT    Recommendations for Other Services       Precautions / Restrictions Precautions Precautions: Fall Restrictions Weight Bearing Restrictions: No    Mobility  Bed Mobility Overal bed mobility: Needs Assistance Bed Mobility: Supine to Sit;Sit to Supine     Supine to sit: Supervision;HOB elevated Sit to supine: Supervision;HOB elevated      Transfers Overall transfer level: Needs assistance Equipment used: None;1 person hand held assist Transfers: Sit to/from Stand Sit to Stand: Min guard Stand pivot transfers: Min guard       General transfer comment: min guard for safety  Ambulation/Gait                 Stairs             Wheelchair Mobility    Modified Rankin (Stroke Patients Only)       Balance Overall balance assessment: Needs  assistance Sitting-balance support: Feet supported       Standing balance support: Bilateral upper extremity supported;During functional activity;Single extremity supported Standing balance-Leahy Scale: Poor                              Cognition Arousal/Alertness: Awake/alert Behavior During Therapy: WFL for tasks assessed/performed Overall Cognitive Status: Within Functional Limits for tasks assessed                                        Exercises Other Exercises Other Exercises: VORx1 exercises 3 bouts of 20-30 seconds, extensive discussion over how to progress    General Comments General comments (skin integrity, edema, etc.): re-tested all canals for BPPV, negative. Head Impulse test postive       Pertinent Vitals/Pain Pain Assessment: No/denies pain    Home Living                      Prior Function            PT Goals (current goals can now be found in the care plan section) Acute Rehab PT Goals Patient Stated Goal: to get home PT Goal Formulation: With patient Time For Goal Achievement: 04/01/18 Potential to Achieve Goals: Good Progress towards PT goals: Progressing toward goals    Frequency    Min 3X/week      PT Plan Current plan remains  appropriate    Co-evaluation              AM-PAC PT "6 Clicks" Daily Activity  Outcome Measure  Difficulty turning over in bed (including adjusting bedclothes, sheets and blankets)?: None Difficulty moving from lying on back to sitting on the side of the bed? : None Difficulty sitting down on and standing up from a chair with arms (e.g., wheelchair, bedside commode, etc,.)?: A Little Help needed moving to and from a bed to chair (including a wheelchair)?: A Little Help needed walking in hospital room?: A Little Help needed climbing 3-5 steps with a railing? : A Little 6 Click Score: 20    End of Session Equipment Utilized During Treatment: Gait belt;Oxygen Activity  Tolerance: Patient tolerated treatment well Patient left: in bed;with bed alarm set;with call bell/phone within reach;with family/visitor present Nurse Communication: Mobility status PT Visit Diagnosis: Unsteadiness on feet (R26.81);Muscle weakness (generalized) (M62.81);Dizziness and giddiness (R42);Other symptoms and signs involving the nervous system (R29.898)     Time: 1400-1430 PT Time Calculation (min) (ACUTE ONLY): 30 min  Charges:  $Therapeutic Exercise: 8-22 mins                     Reinaldo Berber, PT, DPT Acute Rehab Services Pager: [phone]     Reinaldo Berber 03/20/2018, 3:45 PM

## 2018-03-20 NOTE — Progress Notes (Signed)
Cardiology MD aware of occurring vtach and increase in runs, aware of potassium and magnesium levels as well as vital signs  No new orders  Will continue to monitor

## 2018-03-20 NOTE — Progress Notes (Signed)
Family concerned for oxygen tank in room that they will no longer need due to placement at discharge  Called social work, spoke to [place], verified pt will be discharged to SNF when insurance is verified  Spoke to [place] from case management, she will have staff pick up the oxygen tank today

## 2018-03-20 NOTE — Progress Notes (Signed)
Per CCMD pt had 5 beats of vtach  Pt non symptomatic, vital signs documented

## 2018-03-21 DIAGNOSIS — I5043 Acute on chronic combined systolic (congestive) and diastolic (congestive) heart failure: Secondary | ICD-10-CM | POA: Diagnosis not present

## 2018-03-21 DIAGNOSIS — R918 Other nonspecific abnormal finding of lung field: Secondary | ICD-10-CM | POA: Diagnosis not present

## 2018-03-21 DIAGNOSIS — E785 Hyperlipidemia, unspecified: Secondary | ICD-10-CM | POA: Diagnosis not present

## 2018-03-21 DIAGNOSIS — M6281 Muscle weakness (generalized): Secondary | ICD-10-CM | POA: Diagnosis not present

## 2018-03-21 DIAGNOSIS — J301 Allergic rhinitis due to pollen: Secondary | ICD-10-CM

## 2018-03-21 DIAGNOSIS — Z8673 Personal history of transient ischemic attack (TIA), and cerebral infarction without residual deficits: Secondary | ICD-10-CM | POA: Diagnosis not present

## 2018-03-21 DIAGNOSIS — J9611 Chronic respiratory failure with hypoxia: Secondary | ICD-10-CM | POA: Diagnosis not present

## 2018-03-21 DIAGNOSIS — R41841 Cognitive communication deficit: Secondary | ICD-10-CM | POA: Diagnosis not present

## 2018-03-21 DIAGNOSIS — F418 Other specified anxiety disorders: Secondary | ICD-10-CM | POA: Diagnosis not present

## 2018-03-21 DIAGNOSIS — I130 Hypertensive heart and chronic kidney disease with heart failure and stage 1 through stage 4 chronic kidney disease, or unspecified chronic kidney disease: Secondary | ICD-10-CM | POA: Diagnosis not present

## 2018-03-21 DIAGNOSIS — R269 Unspecified abnormalities of gait and mobility: Secondary | ICD-10-CM | POA: Diagnosis not present

## 2018-03-21 DIAGNOSIS — H8149 Vertigo of central origin, unspecified ear: Secondary | ICD-10-CM | POA: Diagnosis not present

## 2018-03-21 DIAGNOSIS — J449 Chronic obstructive pulmonary disease, unspecified: Secondary | ICD-10-CM | POA: Diagnosis not present

## 2018-03-21 DIAGNOSIS — R2681 Unsteadiness on feet: Secondary | ICD-10-CM | POA: Diagnosis not present

## 2018-03-21 DIAGNOSIS — R911 Solitary pulmonary nodule: Secondary | ICD-10-CM | POA: Diagnosis not present

## 2018-03-21 DIAGNOSIS — H8110 Benign paroxysmal vertigo, unspecified ear: Secondary | ICD-10-CM | POA: Diagnosis not present

## 2018-03-21 DIAGNOSIS — R531 Weakness: Secondary | ICD-10-CM | POA: Diagnosis not present

## 2018-03-21 DIAGNOSIS — Z743 Need for continuous supervision: Secondary | ICD-10-CM | POA: Diagnosis not present

## 2018-03-21 DIAGNOSIS — R110 Nausea: Secondary | ICD-10-CM | POA: Diagnosis not present

## 2018-03-21 DIAGNOSIS — R279 Unspecified lack of coordination: Secondary | ICD-10-CM | POA: Diagnosis not present

## 2018-03-21 DIAGNOSIS — J810 Acute pulmonary edema: Secondary | ICD-10-CM | POA: Diagnosis not present

## 2018-03-21 DIAGNOSIS — R42 Dizziness and giddiness: Secondary | ICD-10-CM | POA: Diagnosis not present

## 2018-03-21 LAB — BASIC METABOLIC PANEL
Anion gap: 11 (ref 5–15)
BUN: 13 mg/dL (ref 8–23)
CHLORIDE: 100 mmol/L (ref 98–111)
CO2: 24 mmol/L (ref 22–32)
CREATININE: 0.7 mg/dL (ref 0.44–1.00)
Calcium: 8.9 mg/dL (ref 8.9–10.3)
GFR calc Af Amer: >60 mL/min (ref >60)
GFR calc non Af Amer: >60 mL/min (ref >60)
GLUCOSE: 89 mg/dL (ref 70–99)
Potassium: 4.2 mmol/L (ref 3.5–5.1)
Sodium: 135 mmol/L (ref 135–145)

## 2018-03-21 MED ORDER — FUROSEMIDE 20 MG PO TABS: 20.0000 mg | ORAL_TABLET | Freq: Every day | ORAL | 0 refills | Status: DC

## 2018-03-21 MED ORDER — MECLIZINE HCL 25 MG PO TABS: 25.0000 mg | ORAL_TABLET | Freq: Two times a day (BID) | ORAL | 0 refills | Status: DC | PRN

## 2018-03-21 NOTE — Clinical Social Work Placement (Signed)
   CLINICAL SOCIAL WORK PLACEMENT  NOTE  Date:  03/21/2018  Patient Details  Name: [name] MRN: [id number] Date of Birth: 02/16/1941  Clinical Social Work is seeking post-discharge placement for this patient at the [place] level of care (*CSW will initial, date and re-position this form in  chart as items are completed):      Patient/family provided with [place] Work Department's list of facilities offering this level of care within the geographic area requested by the patient (or if unable, by the patient's family).      Patient/family informed of their freedom to choose among providers that offer the needed level of care, that participate in Medicare, Medicaid or managed care program needed by the patient, have an available bed and are willing to accept the patient.      Patient/family informed of [facility]'s ownership interest in [facility] and [facility], as well as of the fact that they are under no obligation to receive care at these facilities.  PASRR submitted to EDS on 03/21/18     PASRR number received on 03/21/18     Existing PASRR number confirmed on       FL2 transmitted to all facilities in geographic area requested by pt/family on 03/21/18     FL2 transmitted to all facilities within larger geographic area on       Patient informed that his/her managed care company has contracts with or will negotiate with certain facilities, including the following:        Yes   Patient/family informed of bed offers received.  Patient chooses bed at [facility]     Physician recommends and patient chooses bed at      Patient to be transferred to [facility] on 03/21/18.  Patient to be transferred to facility by [facility]     Patient family notified on 03/21/18 of transfer.  Name of family member notified:  Patient notified her niece, [name].     PHYSICIAN       Additional Comment:     _______________________________________________ Candie Chroman, LCSW 03/21/2018, 2:28 PM

## 2018-03-21 NOTE — Clinical Social Work Note (Addendum)
Patient's first preference SNF is [name] so that she can be transported to [facility] instead of [facility] if needed. Referral decision still pending. CSW left message for admissions coordinator to notify. CSW made patient aware of daily copays of $10-$20 for in-network facilities.  Dayton Scrape, [place]  1:54 pm [name] can take patient as long as she pays first week of copays up front (between $70-$140). Patient is agreeable. Authorization approved: [id number]. MD aware.  Dayton Scrape, [place]

## 2018-03-21 NOTE — Consult Note (Signed)
   [facility] CM Inpatient Consult   03/21/2018  [name] October 15, 1940 [id number]   Patient is currently active with [facility] for chronic disease management services.  Patient has been engaged by a Research scientist (medical).  Our community based plan of care has focused on disease management and community resource support.  Patient to [name] will have [name] social worker to follow up at facility. Patient will receive a post facility assessment for community needs and will be evaluated for monthly home visits for assessments and disease process education.   Of note, [name] Care Management services does not replace or interfere with any services that are needed or arranged by inpatient case management or social work.  For additional questions or referrals please contact:   Jodi Brood, RN BSN [facility] Liaison  [phone] business mobile phone Toll free office [phone]

## 2018-03-21 NOTE — Progress Notes (Signed)
Pt discharged to [facility] via [facility], pt assisted to stretcher, vss, pt denies sob or chest pain, 02 4l/[place] per home oxygen regimen, report called to [facility] report given to Tim, all questions entertained and answered

## 2018-03-21 NOTE — Clinical Social Work Note (Signed)
CSW facilitated patient discharge including contacting facility to confirm patient discharge plans. Patient notified family. Clinical information faxed to facility and family agreeable with plan. CSW arranged ambulance transport via [facility] to [facility]. RN to call report prior to discharge [phone] Room 1206).  CSW will sign off for now as social work intervention is no longer needed. Please consult [place] again if new needs arise.  Dayton Scrape, [place]

## 2018-03-21 NOTE — Progress Notes (Signed)
Telemetry called with multifocal PVCS, she reports she was told yesterday to call if >20 in a row but wanted to make sure we knew she was having pvcs, pt in room and asx, MD aware of PVCS, pt to dc to SNF today

## 2018-03-21 NOTE — Discharge Summary (Signed)
Physician Discharge Summary  [name] VOZ:[id number] DOB: [birth year] DOA: 03/18/2018  PCP: Ria Bush, MD  Admit date: 03/18/2018 Discharge date: 03/21/2018  Admitted From: Home Disposition: SNF   Recommendations for Outpatient Follow-up:  1. Follow up with PCP, cardiology in next 2 weeks.  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 fib, hemorrhagic CVA with residual deficits, COPD on 2-4 L of oxygen, and vertigo;who initially presented with complaints of dizziness.Last night around 8:30 PM the patient reported acute onset of her head stating.She took a nap in her recliner when she woke up sometime around 3 AM reported that she was extremely nauseous, but did not vomit. With any activity patient complains of dyspnea on exertion. Associated symptoms included left nostril is always congested, chronic orthopnea patient sleeps in recliner at baseline, intermittent leg swelling, leg cramps, intermittent chest pain(chronic), and anxiety.Family notes that patient is mostly a mouth breather due to issues with congestion that are only temporary improved with medications like Flonase.   She was recently admitted into the hospital on 8/1-8/42for syncope. During that admission patient was taken off scheduled daily Lasix and recommended to utilize as needed for lower extremity leg swelling. Patient had taken Lasix this week every other day due tolower extremity swelling.  Discharge Diagnoses:  Principal Problem:   Pulmonary edema Active Problems:   HLD (hyperlipidemia)   Acute on chronic combined systolic and diastolic CHF (congestive heart failure) (HCC)   History of CVA (cerebrovascular accident) without residual deficits    Vertigo   COPD (chronic obstructive pulmonary disease) (HCC)   Allergic rhinitis   Chronic respiratory failure with hypoxia (HCC)  Pulmonary edema, chronic combined systolic and diastolic GLO:VFIEPPI presents with complaints of dizziness incidentally found to have moderate pulmonary edema on chest x-ray. BNP actually improved when compared to previous values at 317.9. Last EF noted to be 30 to 35% in 06/2017, stable on echo during this admission. Patient had been given 20 mg of Lasix IV with significant diuresisof at least 2 L. - Cardiology c/s, appreciate recs -> recommended continue aldactone and lasix, f/u outpatient with Dr. Burt Knack, now signed off - Continue to monitor weights and call cardiology if weight rises.   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  - Continue meclizine prn continues to require this and is not steady on her feet.  - Needs vestibular rehabilitation.   Chronic respiratory failure with hypoxia, COPD:No exacerbation -Continue home 4 L of oxygen -Continue duonebs, albuterol and Pulmicort  Pulmonary nodules: Patient currently being followed by Dr. Lamonte Sakai for work-up and management of this problem.  -Continue outpatient follow-up  Allergic rhinitis: Patient with left nasal congestion only temporary relief Flonase. Patient reports using it twice as previously advised during her appointment with pulmonology on 8/15. -Continue flonase, claritin  CAD: No anginal symptoms -Continue ASA, plavix and isosorbide mononitrate  Hyperlipidemia -Continue simvastatin  H/OA. fiband CVA with residual deficits: Patient currently in sinus rhythm.  Per chart, has not been deemed good candidate for anticoagulation. - continue DAPT  GERD -Continue Protonix  Discharge Instructions Discharge Instructions    Diet - low sodium heart healthy   Complete by:  As directed    Increase activity slowly   Complete  by:  As directed      Allergies as of  03/21/2018      Reactions   Prednisone Other (See Comments)   Shaking (PILLS) Shot is ok   Actonel [risedronate Sodium] Other (See Comments)   Headache   Amlodipine Other (See Comments)   Pedal edema   Brilinta [ticagrelor] Other (See Comments)   Worsening dyspnea, malaise   Codeine Rash   Fosamax [alendronate Sodium] Other (See Comments)   Unable to tolerate   Lisinopril Cough   Pravastatin Other (See Comments)   Constipation.      Medication List    TAKE these medications   AEROCHAMBER MV inhaler Use as instructed   albuterol (2.5 MG/3ML) 0.083% nebulizer solution Commonly known as:  PROVENTIL Take 3 mLs (2.5 mg total) by nebulization every 4 (four) hours as needed for wheezing or shortness of breath.   albuterol 108 (90 Base) MCG/ACT inhaler Commonly known as:  PROVENTIL HFA;VENTOLIN HFA Inhale 2 puffs into the lungs every 4 (four) hours as needed for wheezing or shortness of breath.   aspirin EC 81 MG tablet Take 1 tablet (81 mg total) by mouth daily.   budesonide 0.5 MG/2ML nebulizer solution Commonly known as:  PULMICORT Take 2 mLs (0.5 mg total) by nebulization 2 (two) times daily.   calcium carbonate 1250 (500 Ca) MG tablet Commonly known as:  OS-CAL - dosed in mg of elemental calcium Take 1 tablet by mouth 2 (two) times daily with a meal.   clopidogrel 75 MG tablet Commonly known as:  PLAVIX Take 1 tablet (75 mg total) by mouth daily.   denosumab 60 MG/ML Soln injection Commonly known as:  PROLIA Inject 60 mg into the skin every 6 (six) months. Administer in upper arm, thigh, or abdomen   fluticasone 50 MCG/ACT nasal spray Commonly known as:  FLONASE INSTILL 2 SPRAYS INTO BOTH NOSTRILS ONCEDAILY AS DIRECTED What changed:  See the new instructions.   furosemide 20 MG tablet Commonly known as:  LASIX Take 1 tablet (20 mg total) by mouth daily. Start taking on:  03/22/2018 What changed:    when to take  this  reasons to take this   isosorbide mononitrate 30 MG 24 hr tablet Commonly known as:  IMDUR TAKE 1 TABLET BY MOUTH DAILY   loratadine 10 MG tablet Commonly known as:  CLARITIN Take 10 mg by mouth daily as needed for allergies or rhinitis.   LOTEMAX 0.5 % ophthalmic suspension Generic drug:  loteprednol Place 1 drop into both eyes daily.   meclizine 25 MG tablet Commonly known as:  ANTIVERT Take 1 tablet (25 mg total) by mouth 2 (two) times daily as needed for dizziness.   nitroGLYCERIN 0.4 MG SL tablet Commonly known as:  NITROSTAT Place 1 tablet (0.4 mg total) under the tongue every 5 (five) minutes as needed for chest pain.   pantoprazole 40 MG tablet Commonly known as:  PROTONIX Take 1 tablet (40 mg total) by mouth 2 (two) times daily.   prednisoLONE acetate 1 % ophthalmic suspension Commonly known as:  PRED FORTE Place 1 drop into both eyes daily.   simvastatin 40 MG tablet Commonly known as:  ZOCOR TAKE 1 TABLET BY MOUTH AT BEDTIME   spironolactone 25 MG tablet Commonly known as:  ALDACTONE Take 0.5 tablets (12.5 mg total) by mouth daily.      Follow-up Information    Ria Bush, MD Follow up.   Specialty:  Family Medicine Contact information: [address] [place] [address] [place],  Legrand Como, MD .   Specialty:  Cardiology Contact information: [address] [address] [phone]        Constance Haw, MD .   Specialty:  Cardiology Contact information: [address] [place] [address] [phone]          Allergies  Allergen Reactions  . Prednisone Other (See Comments)    Shaking (PILLS) Shot is ok  . Actonel [Risedronate Sodium] Other (See Comments)    Headache  . Amlodipine Other (See Comments)    Pedal edema  . Brilinta [Ticagrelor] Other (See Comments)    Worsening dyspnea, malaise  . Codeine Rash  . Fosamax [Alendronate Sodium]  Other (See Comments)    Unable to tolerate  . Lisinopril Cough  . Pravastatin Other (See Comments)    Constipation.    Consultations:  Cardiology  Vestibular PT  Procedures/Studies: Dg Chest 2 View  Result Date: 03/18/2018 CLINICAL DATA:  Cough.  Dizziness and nausea.  Shortness of breath. EXAM: CHEST - 2 VIEW COMPARISON:  Radiographs 02/24/2018.  PET-CT 01/25/2018 FINDINGS: Unchanged heart size and mediastinal contours. Aortic arch atherosclerosis. Development of diffuse reticular opacities consistent with pulmonary edema, slightly more prominent on the left. Small bilateral pleural effusions. Dependent right lung base opacity corresponds to pleural based mass on PET-CT. The left lower lobe pleural based mass on PET is not well visualized radiographically, tentatively seen on the lateral view. No pneumothorax. IMPRESSION: 1. Development of moderate pulmonary edema and small pleural effusions since prior exam. 2. Pleural-based right and left lower lobe pulmonary masses on prior PET are faintly visualized radiographically. Electronically Signed   By: Jeb Levering M.D.   On: 03/18/2018 05:45   Dg Chest 2 View  Result Date: 02/24/2018 CLINICAL DATA:  Shortness of breath. EXAM: CHEST - 2 VIEW COMPARISON:  Radiographs of January 20, 2018. PET scan of January 25, 2018. FINDINGS: The heart size and mediastinal contours are within normal limits. Atherosclerosis of thoracic aorta is noted. No pneumothorax or pleural effusion is noted. Left lung is unremarkable. Right basilar density is noted which corresponds to possible malignancy noted on prior PET scan. The visualized skeletal structures are unremarkable. IMPRESSION: Right basilar density is noted which corresponds to possible mass and malignancy noted on prior PET scan. No other significant abnormality seen. Aortic Atherosclerosis (ICD10-I70.0). Electronically Signed   By: Marijo Conception, M.D.   On: 02/24/2018 13:11   Ct Head Wo Contrast  Result  Date: 02/24/2018 CLINICAL DATA:  Fall, syncope EXAM: CT HEAD WITHOUT CONTRAST TECHNIQUE: Contiguous axial images were obtained from the base of the skull through the vertex without intravenous contrast. COMPARISON:  MRI head 04/20/2015 FINDINGS: Brain: Chronic infarct in the right parietal lobe unchanged. Chronic lacunar infarction left basal ganglia unchanged. Chronic microvascular ischemia throughout the white matter. Negative for acute hemorrhage, mass, or acute infarct. Moderate atrophy. Vascular: Negative for hyperdense vessel Skull: Negative Sinuses/Orbits: Mild mucosal edema paranasal sinuses. Bilateral cataract surgery. Other: None IMPRESSION: Atrophy and chronic ischemic change.  No acute abnormality. Electronically Signed   By: Franchot Gallo M.D.   On: 02/24/2018 14:21     Subjective: Dizziness worst with certain head postures, not able to reproduce with PT yesterday, but best when she tilts head to the right, worse with ambulation. Unsteady per RN. No chest pain, dyspnea.   Discharge Exam: Vitals:   03/21/18 1205 03/21/18 1244  BP: 122/76   Pulse: 63   Resp:  Temp: (!) 97.3 F (36.3 C)   SpO2: 98% 98%   General: Pt is alert, awake, not in acute distress Cardiovascular: RRR, S1/S2 +, no rubs, no gallops Respiratory: CTA bilaterally, no wheezing, no rhonchi Abdominal: Soft, NT, ND, bowel sounds + Extremities: No edema, no cyanosis  Labs: BNP (last 3 results) Recent Labs    07/09/17 0604 02/24/18 1147 03/18/18 0515  BNP 460.0* 341.8* 492.0*   Basic Metabolic Panel: Recent Labs  Lab 03/18/18 0515 03/18/18 1101 03/18/18 2259 03/20/18 0556 03/21/18 0609  NA 139  --  138 138 135  K 4.3  --  4.3 4.2 4.2  CL 107  --  105 102 100  CO2 25  --  29 29 24   GLUCOSE 109*  --  113* 96 89  BUN 21  --  26* 17 13  CREATININE 0.81  --  0.84 0.81 0.70  CALCIUM 8.7*  --  8.5* 9.1 8.9  MG  --  1.9  --  1.9  --    Liver Function Tests: Recent Labs  Lab 03/18/18 0515  AST 23   ALT 22  ALKPHOS 64  BILITOT 0.5  PROT 5.9*  ALBUMIN 3.3*   CBC: Recent Labs  Lab 03/18/18 0515 03/18/18 2259 03/20/18 0556  WBC 6.3 7.5 6.1  NEUTROABS 3.6 4.4  --   HGB 12.6 11.3* 12.0  HCT 38.7 33.9* 35.8*  MCV 96.8 96.6 96.0  PLT 201 204 208   Cardiac Enzymes: Recent Labs  Lab 03/18/18 1101 03/18/18 1712 03/18/18 2259  TROPONINI 0.04* 0.04* 0.03*     Time coordinating discharge: Approximately 40 minutes  Patrecia Pour, MD  [facility] 03/21/2018, 2:12 PM Pager [phone]

## 2018-03-22 ENCOUNTER — Other Ambulatory Visit: Payer: Self-pay | Admitting: Licensed Clinical Social Worker

## 2018-03-22 ENCOUNTER — Ambulatory Visit: Payer: PPO | Admitting: Emergency Medicine

## 2018-03-22 DIAGNOSIS — J810 Acute pulmonary edema: Secondary | ICD-10-CM | POA: Diagnosis not present

## 2018-03-22 DIAGNOSIS — J449 Chronic obstructive pulmonary disease, unspecified: Secondary | ICD-10-CM | POA: Diagnosis not present

## 2018-03-22 DIAGNOSIS — H8110 Benign paroxysmal vertigo, unspecified ear: Secondary | ICD-10-CM | POA: Diagnosis not present

## 2018-03-22 DIAGNOSIS — I5043 Acute on chronic combined systolic (congestive) and diastolic (congestive) heart failure: Secondary | ICD-10-CM | POA: Diagnosis not present

## 2018-03-22 NOTE — Patient Outreach (Signed)
[facility]) Care Management  [facility] Social Work  03/22/2018  Charyl Minervini [place]ingsworth [birth year] [id number]  Subjective:    Objective:   Encounter Medications:  Outpatient Encounter Medications as of 03/22/2018  Medication Sig  . albuterol (PROAIR HFA) 108 (90 Base) MCG/ACT inhaler Inhale 2 puffs into the lungs every 4 (four) hours as needed for wheezing or shortness of breath.  Marland Kitchen albuterol (PROVENTIL) (2.5 MG/3ML) 0.083% nebulizer solution Take 3 mLs (2.5 mg total) by nebulization every 4 (four) hours as needed for wheezing or shortness of breath.  Marland Kitchen aspirin EC 81 MG tablet Take 1 tablet (81 mg total) by mouth daily.  . budesonide (PULMICORT) 0.5 MG/2ML nebulizer solution Take 2 mLs (0.5 mg total) by nebulization 2 (two) times daily.  . calcium carbonate (OS-CAL - DOSED IN MG OF ELEMENTAL CALCIUM) 1250 (500 Ca) MG tablet Take 1 tablet by mouth 2 (two) times daily with a meal.  . clopidogrel (PLAVIX) 75 MG tablet Take 1 tablet (75 mg total) by mouth daily.  Marland Kitchen denosumab (PROLIA) 60 MG/ML SOLN injection Inject 60 mg into the skin every 6 (six) months. Administer in upper arm, thigh, or abdomen  . fluticasone (FLONASE) 50 MCG/ACT nasal spray INSTILL 2 SPRAYS INTO BOTH NOSTRILS ONCEDAILY AS DIRECTED (Patient taking differently: Place 2 sprays into both nostrils daily. )  . furosemide (LASIX) 20 MG tablet Take 1 tablet (20 mg total) by mouth daily.  . isosorbide mononitrate (IMDUR) 30 MG 24 hr tablet TAKE 1 TABLET BY MOUTH DAILY (Patient taking differently: Take 30 mg by mouth daily. )  . loratadine (CLARITIN) 10 MG tablet Take 10 mg by mouth daily as needed for allergies or rhinitis.   . LOTEMAX 0.5 % ophthalmic suspension Place 1 drop into both eyes daily.   . meclizine (ANTIVERT) 25 MG tablet Take 1 tablet (25 mg total) by mouth 2 (two) times daily as needed for dizziness.  . nitroGLYCERIN (NITROSTAT) 0.4 MG SL tablet Place 1 tablet (0.4 mg total) under the tongue every 5 (five)  minutes as needed for chest pain.  . pantoprazole (PROTONIX) 40 MG tablet Take 1 tablet (40 mg total) by mouth 2 (two) times daily.  . prednisoLONE acetate (PRED FORTE) 1 % ophthalmic suspension Place 1 drop into both eyes daily.   . simvastatin (ZOCOR) 40 MG tablet TAKE 1 TABLET BY MOUTH AT BEDTIME (Patient taking differently: Take 40 mg by mouth at bedtime. )  . Spacer/Aero-Holding Chambers (AEROCHAMBER MV) inhaler Use as instructed  . spironolactone (ALDACTONE) 25 MG tablet Take 0.5 tablets (12.5 mg total) by mouth daily.   Facility-Administered Encounter Medications as of 03/22/2018  Medication  . ipratropium-albuterol (DUONEB) 0.5-2.5 (3) MG/3ML nebulizer solution 3 mL    Functional Status:  In your present state of health, do you have any difficulty performing the following activities: 03/18/2018 03/18/2018  Hearing? - -  Vision? - -  Difficulty concentrating or making decisions? - -  Walking or climbing stairs? - -  Dressing or bathing? - -  Doing errands, shopping? N N  Preparing Food and eating ? - -  Using the Toilet? - -  In the past six months, have you accidently leaked urine? - -  Do you have problems with loss of bowel control? - -  Managing your Medications? - -  Managing your Finances? - -  Housekeeping or managing your Housekeeping? - -  Some recent data might be hidden    Fall/Depression Screening:  PHQ 2/9 Scores 11/26/2017 09/10/2017  11/25/2016 03/31/2016 03/09/2016 03/09/2016 01/08/2016  PHQ - 2 Score 0 0 0 0 0 0 0  PHQ- 9 Score 0 - - - 0 - -    Assessment:    CSW traveled to [facility] on 03/22/18 to visit client. CSW met with client on 03/22/18 at client's room at [facility] facility. Client is receiving nursing care at facility. Client had been living at home alone prior to coming to nursing facility.  Client did not mention any pain issues. Client said she began today to receive physical therapy session at facility.  Client said  she sometimes is dizzy and has been working with physical therapy on balance, standing and walking short distances.  Client has family members who live nearby. Client hopes to remain at nursing facility for a few weeks and then hopes to  return home with supports in place. CSW encouraged Matia to talk with facility social worker to finalize client discharge plan. Client was sitting in wheelchair in room. Client uses oxygen as prescribed at 4 liters via nasal canula. Client said she has oxygen concentrator at her home. Again, CSW encouraged client to talk with facility social worker about equipment needs of client for discharge. Client plans to talk with financial representative at nursing facility to learn more about copay amount owed by client (if any). CSW gave client [facility] CSW name and phone number. CSW encouraged client to call CSW as needed to discuss social work needs of client.    Plan:   CSW to call client in 2 weeks to assess client needs and assess discharge plans for client.   Norva Riffle.Monie Shere MSW, LCSW Licensed Clinical Social Worker [facility] Care Management [phone]

## 2018-03-23 NOTE — Telephone Encounter (Signed)
Spoke with Almyra Free on the phone. She stated that when the patient was discharged from [facility] to [facility]. Per the medication list that was given to [facility], DuoNeb was not on that list. Since the medication was not on the list, they are refusing to give it to the patient.   Received patient's chart, medication has been removed but it clearly states in her discharge summary that she is to use DuoNebs every 6 hours.   [place] and requested to speak with a nurse in [place]. The phone kept ringing and no one answered. Was able to get transferred over to the nursing supervisor but had to leave a message.   Brantley Fling that I would keep calling to get someone on the phone. I will also add the DuoNebs back to the patient's medication list.

## 2018-03-24 ENCOUNTER — Encounter: Payer: Self-pay | Admitting: *Deleted

## 2018-03-24 ENCOUNTER — Other Ambulatory Visit: Payer: Self-pay | Admitting: *Deleted

## 2018-03-24 ENCOUNTER — Ambulatory Visit: Payer: PPO | Admitting: Cardiology

## 2018-03-24 DIAGNOSIS — J449 Chronic obstructive pulmonary disease, unspecified: Secondary | ICD-10-CM | POA: Diagnosis not present

## 2018-03-24 DIAGNOSIS — H8110 Benign paroxysmal vertigo, unspecified ear: Secondary | ICD-10-CM | POA: Diagnosis not present

## 2018-03-24 DIAGNOSIS — J810 Acute pulmonary edema: Secondary | ICD-10-CM | POA: Diagnosis not present

## 2018-03-24 DIAGNOSIS — R110 Nausea: Secondary | ICD-10-CM | POA: Diagnosis not present

## 2018-03-24 NOTE — Patient Outreach (Signed)
[facility]) Care Management  03/24/2018  Nakema Fake [place]umb 1940/08/05 [id number]   RN Health Coach sse closure. Patient was admitted to hospital and on discharge transferred to [facility] from [facility]. Patient will be followed by social worker for care management.  [facility] Care Management [phone]

## 2018-03-27 DIAGNOSIS — J810 Acute pulmonary edema: Secondary | ICD-10-CM | POA: Diagnosis not present

## 2018-03-27 DIAGNOSIS — M6281 Muscle weakness (generalized): Secondary | ICD-10-CM | POA: Diagnosis not present

## 2018-03-27 DIAGNOSIS — R918 Other nonspecific abnormal finding of lung field: Secondary | ICD-10-CM | POA: Diagnosis not present

## 2018-03-27 DIAGNOSIS — H8110 Benign paroxysmal vertigo, unspecified ear: Secondary | ICD-10-CM | POA: Diagnosis not present

## 2018-03-27 DIAGNOSIS — J449 Chronic obstructive pulmonary disease, unspecified: Secondary | ICD-10-CM | POA: Diagnosis not present

## 2018-03-27 DIAGNOSIS — R41841 Cognitive communication deficit: Secondary | ICD-10-CM | POA: Diagnosis not present

## 2018-03-27 DIAGNOSIS — R2681 Unsteadiness on feet: Secondary | ICD-10-CM | POA: Diagnosis not present

## 2018-03-27 DIAGNOSIS — I5043 Acute on chronic combined systolic (congestive) and diastolic (congestive) heart failure: Secondary | ICD-10-CM | POA: Diagnosis not present

## 2018-03-27 DIAGNOSIS — F418 Other specified anxiety disorders: Secondary | ICD-10-CM | POA: Diagnosis not present

## 2018-03-29 DIAGNOSIS — J449 Chronic obstructive pulmonary disease, unspecified: Secondary | ICD-10-CM | POA: Diagnosis not present

## 2018-03-29 DIAGNOSIS — I5043 Acute on chronic combined systolic (congestive) and diastolic (congestive) heart failure: Secondary | ICD-10-CM | POA: Diagnosis not present

## 2018-03-29 DIAGNOSIS — F418 Other specified anxiety disorders: Secondary | ICD-10-CM | POA: Diagnosis not present

## 2018-03-29 DIAGNOSIS — J810 Acute pulmonary edema: Secondary | ICD-10-CM | POA: Diagnosis not present

## 2018-04-05 ENCOUNTER — Inpatient Hospital Stay: Payer: PPO | Admitting: Family Medicine

## 2018-04-05 ENCOUNTER — Other Ambulatory Visit: Payer: Self-pay | Admitting: Licensed Clinical Social Worker

## 2018-04-05 NOTE — Patient Outreach (Signed)
Assessment:  CSW spoke via phone with client. CSW verified client identity. CSW received verbal permission from client for CSW to speak with client about client needs.  Client is receiving nursing care and physical therapy support at [facility].  Client uses oxygen as prescribed to help her with breathing needs of client.  Client said she was on 4 liters of oxygen to help with breathing and is now down to needing 2 liters of oxygen to help with breathing. She is using a walker in physical therapy sessions to help her with ambulation. She said she is hoping to discharge from facility possibly this Friday but she is not sure of date. CSW has encouraged her to talk with facility social worker to finalize client discharge plans.  Client said she has a walker at home to use. She said she also has oxygen concentrator at home to use as needed. CSW thanked client for phone call. CSW encouraged Valli to call CSW as needed to discuss social work needs of client. Client was appreciative of call from [place] on 04/05/18.  Plan:  CSW to call client within the next week to assess client needs and discharge plans.    Norva Riffle.Cavion Faiola MSW, LCSW Licensed Clinical Social Worker [facility] Care Management [phone].                   Plan:

## 2018-04-06 ENCOUNTER — Telehealth: Payer: Self-pay | Admitting: Cardiovascular Disease

## 2018-04-06 DIAGNOSIS — J9611 Chronic respiratory failure with hypoxia: Secondary | ICD-10-CM

## 2018-04-06 NOTE — Telephone Encounter (Signed)
New Message:    Patient has follow up questions

## 2018-04-06 NOTE — Telephone Encounter (Signed)
Left message to call back  

## 2018-04-07 ENCOUNTER — Telehealth: Payer: Self-pay | Admitting: Emergency Medicine

## 2018-04-07 DIAGNOSIS — H8110 Benign paroxysmal vertigo, unspecified ear: Secondary | ICD-10-CM | POA: Diagnosis not present

## 2018-04-07 DIAGNOSIS — R918 Other nonspecific abnormal finding of lung field: Secondary | ICD-10-CM | POA: Diagnosis not present

## 2018-04-07 DIAGNOSIS — I5043 Acute on chronic combined systolic (congestive) and diastolic (congestive) heart failure: Secondary | ICD-10-CM | POA: Diagnosis not present

## 2018-04-07 DIAGNOSIS — J810 Acute pulmonary edema: Secondary | ICD-10-CM | POA: Diagnosis not present

## 2018-04-07 NOTE — Telephone Encounter (Signed)
F/u message         Patient call to check the status of her first message, pls call and advise

## 2018-04-07 NOTE — Telephone Encounter (Signed)
[name] requests a visit with Cardiology. She was instructed to call by the rehab facility she is being discharged from tomorrow. Per patient request, scheduled her with [facility]. [facility] on 10/3. She will confirm with her family member who will bring her and call if she needs to reschedule. She was grateful for assistance.

## 2018-04-07 NOTE — Telephone Encounter (Signed)
Called and spoke with Melissa with [facility] regarding pt discharge forms from [facility] already has all info needed at this time Nothing further needed.

## 2018-04-08 ENCOUNTER — Other Ambulatory Visit: Payer: Self-pay | Admitting: Licensed Clinical Social Worker

## 2018-04-08 NOTE — Patient Outreach (Signed)
Assessment:  CSW spoke recently with client via phone. [place] reported that she was using oxygen at 2 liters continuous via nasal canula. CSW contacted [facility] facility on 04/08/18 to check on status of client discharge. Client did discharge from [facility] on 04/08/18 and returned to her home in [place], [place].  Client has family support. A family member is inquiring for W[place] about putting [facility] on [address] in [place], [place].  [place] has agreed to [facility] RN transition of care calls to check on the nursing needs of client.  CSW placed order on 04/08/18 for [facility] RN to make transition of care calls to client. Client has concentrator at home to use. Client uses walker to help with ambulation.  Client has not mentioned any other needs at present. Client is looking forward to receiving transition of care calls from [facility] is discharging client from [facility] on 04/08/18 since client has no further CSW needs at present.   Plan:  CSW is discharging client from [facility] on 04/08/18 since client has no further CSW needs at present.  Norva Riffle.Leeloo Silverthorne MSW, LCSW Licensed Clinical Social Worker [facility] Care Management [phone]

## 2018-04-10 DIAGNOSIS — R269 Unspecified abnormalities of gait and mobility: Secondary | ICD-10-CM | POA: Diagnosis not present

## 2018-04-10 DIAGNOSIS — E785 Hyperlipidemia, unspecified: Secondary | ICD-10-CM | POA: Diagnosis not present

## 2018-04-10 DIAGNOSIS — J449 Chronic obstructive pulmonary disease, unspecified: Secondary | ICD-10-CM | POA: Diagnosis not present

## 2018-04-10 DIAGNOSIS — R911 Solitary pulmonary nodule: Secondary | ICD-10-CM | POA: Diagnosis not present

## 2018-04-11 ENCOUNTER — Telehealth: Payer: Self-pay | Admitting: Family Medicine

## 2018-04-11 ENCOUNTER — Other Ambulatory Visit: Payer: Self-pay

## 2018-04-11 NOTE — Telephone Encounter (Signed)
plz have them fax over plan of care and I will sign?

## 2018-04-11 NOTE — Patient Outreach (Signed)
[facility]) Care Management  04/11/2018  [name] 1940-10-14 [id number]  Transition of Care Outreach #1 Susscssful initial telephone outreach encounter to [name], referred to [place] Case Management for Transition of Care calls and Community Case Management after recent hospitalization 8-23 to 8-26 for pulmonary edema and SNF stay 8/26-9/13. Patient was discharged to home for self-care with [place] provided by [place]. Patient has a history of but not limited to HTN, HLD, CAD, Combined systolic and diastolic HF, Afib, Hemorrhagic CVA with residuals, and COPD/O2 dependent. HIPAA identifiers verified by patient. [facility] Community Case Management services were again discussed with patient and patient gave verbal consent for participation in the [place] Case Management program.   Patient reports doing well. She states she is back to her baseline with her shortness of breath. "I can walk around the house and do my chores like I could before I went to the hospital". She reports living alone but receiving abundance of help from [name], niece and [name], sister-in-law. Patient denies needs for transportation, food, housing, or medications assistance at this time.  Patient states [facility] RN completed the initial assessment on Saturday. Patient has been told PT will be working with patient however she is unsure when services will begin. She is anticipating a call today.  Medications: --Patient states has all medications and takes as prescribed by SNF. --Patient verbalizes ability to take independently however her niece fills her pillbox weekly and "makes sure all my prescriptions are filled so I never run out of medications" --Medication review not competed related to time constraints however patient states she and niece reviewed SNF list upon her return home and all medication were verified.  Follow-up  Appointments: --Appointments reviewed in EMR. --All upcomming appointments reviewed with patient during this encounter.  --Patient reports follow-up appointments as follows. PCP 04/19/18 at 12 noon Cardiology 04/28/18 at 1130 Patient will call today for a follow-up appointment with pulmonologist Dr. Lamonte Sakai   Safety/Mobility/Falls: --Patient denies new or recent falls --Patient uses her Rolator with all ambulation --General fall risks and prevention discussed with patient during this encounter.  Advanced Directives: --Patient states she currently has Advanced Directives in place and denies need for change or additional information on Directives at this time.  Patient denied additional issues or concerns other than stated above. RN Case Managers contact information was provided verbally along with the main [facility] CM office number and the 24 hour nurse advice line.  Plan: --RN CM will follow-up with patient in one week via initial home visit outreach --Will send [place] letter to PCP stating [place] Case Management involvement post hospitalization  [facility] CM Care Plan Problem One     Most Recent Value  Care Plan Problem One  High risk for readmission related to knowledge deficit about chronic health conditions of COPD and CHF  Role Documenting the Problem One  Care Management Coordinator  Care Plan for Problem One  Active  THN Long Term Goal   Over the next 31 days patient will not experience hospital readmission, as evidenced by patient reporting and review of EMR  THN Long Term Goal Start Date  04/11/18  Interventions for Problem One Long Term Goal  discussed with patient recent hospital admission, need for keeping follow-up appointments with cardiologist and PCP. Discussed importance of making follow-up appointment with pulmonologist for post hospital/SNF discharge.  THN CM Short Term Goal #1   Over the next 30 days patient will  take all medications as prescribed as evidenced by patient  reporting and review of EMR  THN CM Short Term Goal #1 Start Date  04/11/18  Interventions for Short Term Goal #1  discussed importance of taking all medications as prescribed, taking all medications to MD follow-up appointments along with SNF medication list  THN CM Short Term Goal #2   Over the next 30 days patient will complete all scheduled MD appointments as evidenced by patient reporting and review of EMR documentation  THN CM Short Term Goal #2 Start Date  04/11/18  Interventions for Short Term Goal #2  Confirmed with patient all follow-up appointments with cardiology and PCP. Encouraged patient to make follow-up appointment with pulmonology     [name] E. Rollene Rotunda RN, BSN [facility] Care Management Coordinator [phone]

## 2018-04-11 NOTE — Telephone Encounter (Signed)
Copied from [place] [phone]. Topic: General - Other >> Apr 11, 2018  3:47 PM Cecelia Byars, NT wrote: Reason for CRM: Mardene Celeste from [facility] called and needs plan of care orders  for the patient ,please call her  [phone]

## 2018-04-12 NOTE — Telephone Encounter (Signed)
Spoke with Mardene Celeste of [facility] asking her to fax plan of care to [phone]. Says she will send it now.

## 2018-04-13 ENCOUNTER — Telehealth: Payer: Self-pay | Admitting: Family Medicine

## 2018-04-13 NOTE — Telephone Encounter (Signed)
Agree with this. Thanks.  

## 2018-04-13 NOTE — Telephone Encounter (Signed)
Copied from [place] [phone]. Topic: Quick Communication - See Telephone Encounter >> Apr 13, 2018  1:26 PM Rutherford Nail, [place] wrote: CRM for notification. See Telephone encounter for: 04/13/18. Gerald Stabs, physical therapist, with [place] calling and is requesting verbal orders for the following: 1x1 2x2 2x3 CB#: [phone]

## 2018-04-14 ENCOUNTER — Encounter: Payer: Self-pay | Admitting: Cardiology

## 2018-04-14 NOTE — Telephone Encounter (Signed)
Left message on vm informing [name] of [facility] that Dr. Darnell Level is giving verbal orders for PT.

## 2018-04-15 ENCOUNTER — Telehealth: Payer: Self-pay | Admitting: Family Medicine

## 2018-04-15 NOTE — Telephone Encounter (Signed)
Copied from [place] [phone]. Topic: General - Other >> Apr 15, 2018  1:53 PM Yvette Rack wrote:   Reason for CRM: pt calling to transfer care from Dr Danise Mina to Philis Nettle at [place] its closer to home. OK to transfer?

## 2018-04-16 NOTE — Telephone Encounter (Signed)
Very pleasant patient. Ok to transfer if ok by Ander Purpura.  Tenuous status, followed closely by cards and pulm, ambulation limited by dyspnea. Transfer may be a good thing if it facilitates her being able to get to the office.

## 2018-04-18 ENCOUNTER — Other Ambulatory Visit: Payer: Self-pay

## 2018-04-18 NOTE — Patient Outreach (Signed)
[facility]) Care Management   04/18/2018  [name] 1941/02/01 [id number]  [name] is an 77 y.o. female  Subjective: Patient states she is doing well since discharge from SNF. She states she is able to complete all ADLs independently. She states she is able to care for herself but has lost of family support if she needs it. Patient admits to being back at her "baseline" for her respiratory and cardiovascular health. "I just want to stay out of the hospital".  Objective:   BP 120/80 (BP Location: Left Arm, Patient Position: Sitting, Cuff Size: Normal)   Pulse 60   Resp 18   Ht 1.676 m (5\' 6" )   Wt 129 lb (58.5 kg) Comment: home scales  SpO2 99%   BMI 20.82 kg/m   Physical Exam  Constitutional: She is oriented to person, place, and time. She appears well-developed and well-nourished.  Cardiovascular: Normal rate, regular rhythm, normal heart sounds and intact distal pulses.  Respiratory: Breath sounds normal. No respiratory distress. She has no wheezes. She has no rales.  GI: Soft. Bowel sounds are normal.  Musculoskeletal: Normal range of motion. She exhibits no edema.  Neurological: She is alert and oriented to person, place, and time.  Skin: Skin is warm and dry.  Scattered ecchymosis on arms  Psychiatric: She has a normal mood and affect. Her behavior is normal. Judgment and thought content normal.    Encounter Medications:   Outpatient Encounter Medications as of 04/18/2018  Medication Sig Note  . albuterol (PROAIR HFA) 108 (90 Base) MCG/ACT inhaler Inhale 2 puffs into the lungs every 4 (four) hours as needed for wheezing or shortness of breath.   Marland Kitchen aspirin EC 81 MG tablet Take 1 tablet (81 mg total) by mouth daily.   . budesonide (PULMICORT) 0.5 MG/2ML nebulizer solution Take 2 mLs (0.5 mg total) by nebulization 2 (two) times daily.   . calcium carbonate (OS-CAL - DOSED IN MG OF ELEMENTAL CALCIUM) 1250 (500 Ca) MG tablet Take 1 tablet by mouth 2  (two) times daily with a meal.   . cholecalciferol (VITAMIN D) 1000 units tablet Take 1,000 Units by mouth daily.   . clopidogrel (PLAVIX) 75 MG tablet Take 1 tablet (75 mg total) by mouth daily.   Marland Kitchen denosumab (PROLIA) 60 MG/ML SOLN injection Inject 60 mg into the skin every 6 (six) months. Administer in upper arm, thigh, or abdomen   . fluticasone (FLONASE) 50 MCG/ACT nasal spray INSTILL 2 SPRAYS INTO BOTH NOSTRILS ONCEDAILY AS DIRECTED (Patient taking differently: Place 2 sprays into both nostrils daily. )   . furosemide (LASIX) 20 MG tablet Take 1 tablet (20 mg total) by mouth daily. 04/18/2018: May take an additional 1/2 tablet for swelling  . ipratropium-albuterol (DUONEB) 0.5-2.5 (3) MG/3ML SOLN Take 3 mLs by nebulization every 6 (six) hours.   . isosorbide mononitrate (IMDUR) 30 MG 24 hr tablet TAKE 1 TABLET BY MOUTH DAILY (Patient taking differently: Take 30 mg by mouth daily. )   . loratadine (CLARITIN) 10 MG tablet Take 10 mg by mouth daily as needed for allergies or rhinitis.    Marland Kitchen meclizine (ANTIVERT) 25 MG tablet Take 1 tablet (25 mg total) by mouth 2 (two) times daily as needed for dizziness.   . pantoprazole (PROTONIX) 40 MG tablet Take 1 tablet (40 mg total) by mouth 2 (two) times daily.   . prednisoLONE acetate (PRED FORTE) 1 % ophthalmic suspension Place 1 drop into both eyes daily.    Marland Kitchen  simvastatin (ZOCOR) 40 MG tablet TAKE 1 TABLET BY MOUTH AT BEDTIME (Patient taking differently: Take 40 mg by mouth at bedtime. )   . Spacer/Aero-Holding Chambers (AEROCHAMBER MV) inhaler Use as instructed   . spironolactone (ALDACTONE) 25 MG tablet Take 0.5 tablets (12.5 mg total) by mouth daily.   Marland Kitchen albuterol (PROVENTIL) (2.5 MG/3ML) 0.083% nebulizer solution Take 3 mLs (2.5 mg total) by nebulization every 4 (four) hours as needed for wheezing or shortness of breath. (Patient not taking: Reported on 04/18/2018)   . LOTEMAX 0.5 % ophthalmic suspension Place 1 drop into both eyes daily.    .  nitroGLYCERIN (NITROSTAT) 0.4 MG SL tablet Place 1 tablet (0.4 mg total) under the tongue every 5 (five) minutes as needed for chest pain. (Patient not taking: Reported on 04/18/2018) 04/18/2018: Has available   Facility-Administered Encounter Medications as of 04/18/2018  Medication  . ipratropium-albuterol (DUONEB) 0.5-2.5 (3) MG/3ML nebulizer solution 3 mL    Functional Status:   In your present state of health, do you have any difficulty performing the following activities: 04/18/2018 03/18/2018  Hearing? N -  Vision? N -  Difficulty concentrating or making decisions? N -  Walking or climbing stairs? Y -  Comment yes related to shortess of breath from chf and copd -  Dressing or bathing? N -  Doing errands, shopping? N N  Comment but patient states "it wears me out" -  Preparing Food and eating ? N -  Using the Toilet? N -  In the past six months, have you accidently leaked urine? Y -  Do you have problems with loss of bowel control? N -  Managing your Medications? Y -  Comment niece fixes pill box -  Managing your Finances? N -  Housekeeping or managing your Housekeeping? N -  Some recent data might be hidden    Fall/Depression Screening:    Fall Risk  04/18/2018 11/26/2017 09/10/2017  Falls in the past year? Yes No No  Number falls in past yr: 2 or more - -  Injury with Fall? No - -  Risk Factor Category  High Fall Risk - -  Risk for fall due to : History of fall(s) - -  Follow up Falls evaluation completed;Falls prevention discussed;Education provided - -   PHQ 2/9 Scores 04/18/2018 11/26/2017 09/10/2017 11/25/2016 03/31/2016 03/09/2016 03/09/2016  PHQ - 2 Score 0 0 0 0 0 0 0  PHQ- 9 Score - 0 - - - 0 -    Assessment: Patient is pleasant and well groomed.  She is observed ambulating throughout her home unassisted. Slow steady gait. Patient able to manipulate 63ft O2 cord although it presents a fall hazard. She is very conscious of its location. Rolator used by patient when she is alone  in home. Denies falls since last encounter with RN CM.  Sister-in-law/caregiver present during home visit. Assist patient with transportation to and from MD appointments. Also checks on patient daily by phone or in person. Pays for patients [facility] "Life Alert". Lives near by. Her daughter is patients POA.  Medications and filled pillboxes in home. Niece/POA fills box weekly. Patient is able to take additional Furosamide from bottle when needed. Nebulizer in working order. Small portable oxygen tanks readily available for use while outside home.  Patient able to verbalize all follow-up appointments in EMR for Cardiology, PCP, and Pulmonology. Transportation provided by family.  Patient denies needs for additional community resources. Sister-in-law states she has "checked into" meals-on-wheels for patient and because  she lives in [facility], she was told her wait time would be approximately 6 months. Patient denies need for food at this time. Interested in financial assistance for Pulmicort if available related to high heating cost during winter months. HH RT will do a respiratory assessment this week to determine if patient will re-qualify for a POC.   Plan: Follow up transition of care call next week. Will place pharmacy referral for possible assistance with pulmicort.  THN CM Care Plan Problem One     Most Recent Value  Care Plan Problem One  High risk for readmission related to knowledge deficit about chronic health conditions of COPD and CHF  Role Documenting the Problem One  Care Management Coordinator  Care Plan for Problem One  Active  THN Long Term Goal   Over the next 31 days patient will not experience hospital readmission, as evidenced by patient reporting and review of EMR  THN Long Term Goal Start Date  04/11/18  Interventions for Problem One Long Term Goal  reviewed in depth HF packet including HF zones, discussed low NA diet and lable reading, reviewed upcoming appointments,  performed medication reconciliation  THN CM Short Term Goal #1   Over the next 30 days patient will take all medications as prescribed as evidenced by patient reporting and review of EMR  THN CM Short Term Goal #1 Start Date  04/11/18  Interventions for Short Term Goal #1  complete visual medication reconciliation  THN CM Short Term Goal #2   Over the next 30 days patient will complete all scheduled MD appointments as evidenced by patient reporting and review of EMR documentation  THN CM Short Term Goal #2 Start Date  04/11/18  Interventions for Short Term Goal #2  reviewed upcoming appointments with patient and caregiver and verified transportation, discussed importance of regular follow-up with providers      Electra Paladino E. Rollene Rotunda RN, BSN [facility] Care Management Coordinator [phone]

## 2018-04-18 NOTE — Telephone Encounter (Signed)
Yes this TOC is fine with me!  Thanks, Philis Nettle

## 2018-04-19 ENCOUNTER — Inpatient Hospital Stay: Payer: PPO | Admitting: Family Medicine

## 2018-04-19 ENCOUNTER — Telehealth: Payer: Self-pay | Admitting: Emergency Medicine

## 2018-04-19 NOTE — Telephone Encounter (Signed)
Spoke with Lauren with [facility]. She is calling wanting to know if we have placed an order for the pt to have a POC. Advised her this order was placed on 04/06/18. Lauren is going to speak to the pt and let us know if anything further is needed. Nothing further was needed.

## 2018-04-19 NOTE — Telephone Encounter (Signed)
Please call to get set up.

## 2018-04-20 ENCOUNTER — Other Ambulatory Visit: Payer: Self-pay | Admitting: Pharmacist

## 2018-04-20 NOTE — Patient Outreach (Signed)
[facility]) Care Management  04/20/2018  [name] 11-16-1940 [id number]  77 y.o. year old female referred to [facility] for medication assistance with Pulmicort nebulizer solution.   Successful telephone call to [name].  HIPAA identifiers verified. I reviewed reason for calling with patient.  Patient reports she does not know if she needs medication assistance with Pulmicort.  She does not remember what it cost during her last refill and does not recall whether it was expensive or not.  She states her family member told [facility] RN Trish Fountain that she would like medication assistance but does not think this was appropriate.  I offered to call patient's pharmacy to find out co-pay cost and if medication is being billed to Part B vs Part D however patient adamantly refused that I call her pharmacy.  She states she will call herself as she has other questions to discuss with pharmacy team and she will reach back out to me afterwards.  I provided my name and contact information to [name].    Plan: I will await call back from [name].  I will reach back out to her next week if I have not heard back yet.   Ralene Bathe, PharmD, [place] [phone]

## 2018-04-21 DIAGNOSIS — E785 Hyperlipidemia, unspecified: Secondary | ICD-10-CM | POA: Diagnosis not present

## 2018-04-21 DIAGNOSIS — J449 Chronic obstructive pulmonary disease, unspecified: Secondary | ICD-10-CM | POA: Diagnosis not present

## 2018-04-21 DIAGNOSIS — R911 Solitary pulmonary nodule: Secondary | ICD-10-CM | POA: Diagnosis not present

## 2018-04-21 DIAGNOSIS — R269 Unspecified abnormalities of gait and mobility: Secondary | ICD-10-CM | POA: Diagnosis not present

## 2018-04-22 ENCOUNTER — Ambulatory Visit (INDEPENDENT_AMBULATORY_CARE_PROVIDER_SITE_OTHER): Payer: PPO | Admitting: Family Medicine

## 2018-04-22 ENCOUNTER — Encounter: Payer: Self-pay | Admitting: Family Medicine

## 2018-04-22 VITALS — BP 136/70 | HR 71 | Temp 97.9°F | Ht 66.0 in | Wt 130.2 lb

## 2018-04-22 DIAGNOSIS — I5042 Chronic combined systolic (congestive) and diastolic (congestive) heart failure: Secondary | ICD-10-CM

## 2018-04-22 DIAGNOSIS — I10 Essential (primary) hypertension: Secondary | ICD-10-CM

## 2018-04-22 DIAGNOSIS — J449 Chronic obstructive pulmonary disease, unspecified: Secondary | ICD-10-CM | POA: Diagnosis not present

## 2018-04-22 NOTE — Patient Instructions (Signed)
[facility] expand/collapse Whether you're newly diagnosed or have been living with heart failure for years, you'll benefit from care at [facility]. During clinic appointments with a cardiologist or nurse practitioner (NP), you'll learn about: Daily weight monitoring Safe medication use Benefits of a low-sodium diet   Heart Failure Clinic at [facility] [address], [place]  [place], [place] [address]

## 2018-04-22 NOTE — Progress Notes (Signed)
Subjective:    Patient ID: [name], female    DOB: [birth year], 77 y.o.   MRN: [id number]  HPI  Patient presents to clinic as a transfer care from Dr. Danise Mina at [facility].   Patient's active problem lifts, surgical history, social history, family history reviewed and updated accordingly in chart.  Patient has COPD and also many chronic cardiac conditions.  She follows regularly with pulmonologist Dr Lamonte Sakai; pulmonologist recently convinced patient to wear oxygen, and patient states she feels her breathing is much better now.    Cardiologist is Dr. Burt Knack; patient follows with cardiology regularly for management of atrial fibrillation, coronary artery disease, essential hypertension, ischemic cardiomyopathy, congestive heart failure.  Patient's flu vaccine is up-to-date for 2019-2020 season.  Patient did have first shingles shot, second shingles shot is coming up in October 2019.  Patient's mammogram is up-to-date, had one in October 2018.  Most recent lab work done August 2019 reviewed   Patient Active Problem List   Diagnosis Date Noted  . Pulmonary edema 03/18/2018  . Atrial fibrillation with rapid ventricular response ([place])   . Pressure injury of skin 02/25/2018  . Syncope 02/24/2018  . AF (paroxysmal atrial fibrillation) ([place]) 02/24/2018  . Chronic respiratory failure with hypoxia ([place]) 11/23/2017  . PVC's (premature ventricular contractions) 08/31/2017  . Skin rash 08/02/2017  . History of ST elevation myocardial infarction (STEMI) 07/06/2017  . Dyspnea 05/03/2017  . DNR (do not resuscitate) 12/07/2016  . Allergic rhinitis 07/31/2016  . Productive cough 06/29/2016  . Symptomatic bradycardia 04/01/2016  . Malnutrition of moderate degree 03/20/2016  . COPD (chronic obstructive pulmonary disease) ([place]) 03/19/2016  . Lumbar pain with radiation down left leg 03/02/2016  . Underweight 10/07/2015  . Vertigo 04/22/2015  . Skin nodule 04/22/2015  . Health  maintenance examination 04/08/2015  . Advanced care planning/counseling discussion 04/08/2015  . Vitamin D insufficiency 02/02/2015  . CKD (chronic kidney disease) stage 3, GFR 30-59 ml/min (HCC)   . History of CVA (cerebrovascular accident) without residual deficits   . Prediabetes   . Ischemic cardiomyopathy 02/27/2014  . Osteoporosis 09/24/2013  . Pulmonary nodules 10/26/2012  . History of TIA (transient ischemic attack) 07/08/2012  . Cancer of lower lobe of right lung ([place]) 04/21/2012  . Acute on chronic combined systolic and diastolic CHF (congestive heart failure) ([place]) 06/17/2009  . HLD (hyperlipidemia) 04/24/2009  . CAD (coronary artery disease) 04/24/2009  . GERD 04/24/2009  . Macular degeneration (senile) of retina 04/23/2009  . Essential hypertension 04/23/2009  . Osteoarthritis 04/23/2009   Social History   Tobacco Use  . Smoking status: Former Smoker    Packs/day: 0.50    Years: 42.00    Pack years: 21.00    Types: Cigarettes    Last attempt to quit: 07/27/2002    Years since quitting: 15.7  . Smokeless tobacco: Never Used  Substance Use Topics  . Alcohol use: No    Alcohol/week: 0.0 standard drinks   Past Surgical History:  Procedure Laterality Date  . BREAST BIOPSY Left   . CARDIAC CATHETERIZATION N/A 02/24/2016   Procedure: Left Heart Cath and Coronary Angiography;  Surgeon: Belva Crome, MD;  Location: [place] [facility];  Service: Cardiovascular;  Laterality: N/A;  . CARDIAC CATHETERIZATION N/A 07/09/2016   Procedure: Left Heart Cath and Coronary Angiography;  Surgeon: Peter M Martinique, MD;  Location: [place] [facility];  Service: Cardiovascular;  Laterality: N/A;  . CARDIAC CATHETERIZATION N/A 07/09/2016   Procedure: Coronary Stent  Intervention;  Surgeon: Peter M Martinique, MD;  Location: [place] [facility];  Service: Cardiovascular;  Laterality: N/A;  . CATARACT EXTRACTION Bilateral   . CORONARY STENT INTERVENTION N/A 07/06/2017   Procedure: CORONARY STENT  INTERVENTION;  Surgeon: Lorretta Harp, MD;  Location: [place] [facility];  Service: Cardiovascular;  Laterality: N/A;  . CORONARY STENT PLACEMENT  06/2016   DES to mid LAD  . CORONARY/GRAFT ACUTE MI REVASCULARIZATION N/A 07/06/2017   Procedure: Coronary/Graft Acute MI Revascularization;  Surgeon: Lorretta Harp, MD;  Location: [place] [facility];  Service: Cardiovascular;  Laterality: N/A;  . EYE SURGERY    . LEFT HEART CATH AND CORONARY ANGIOGRAPHY N/A 07/06/2017   Procedure: LEFT HEART CATH AND CORONARY ANGIOGRAPHY;  Surgeon: Lorretta Harp, MD;  Location: [place] [facility];  Service: Cardiovascular;  Laterality: N/A;  . LUNG REMOVAL, PARTIAL Right 2008  . PARTIAL HYSTERECTOMY  1986   irregular periods, ovaries remain   Family History  Problem Relation Age of Onset  . CAD Mother 34       MI  . Stroke Mother   . Hypertension Mother   . Heart attack Mother   . CAD Father 14  . Stroke Father   . Breast cancer Sister   . Diabetes Sister    Review of Systems   Constitutional: Negative for chills, fatigue and fever.  HENT: Negative for congestion, ear pain, sinus pain and sore throat.   Eyes: Negative.   Respiratory: chronic SOB, better now with oxygen  Cardiovascular: Negative for chest pain, palpitations and leg swelling.  Gastrointestinal: Negative for abdominal pain, diarrhea, nausea and vomiting.  Genitourinary: Negative for dysuria, frequency and urgency.  Musculoskeletal: Negative for arthralgias and myalgias.  Skin: Negative for color change, pallor and rash.  Neurological: Negative for syncope, light-headedness and headaches.  Psychiatric/Behavioral: The patient is not nervous/anxious.       Objective:   Physical Exam   Constitutional: Frail elderly woman Head: Normocephalic and atraumatic.  Eyes: EOM are normal. No scleral icterus.  Neck: Normal range of motion. Neck supple. No tracheal deviation present.  Cardiovascular: Normal rate, regular rhythm and  normal heart sounds.  Pulmonary/Chest: Effort normal. No respiratory distress. Wears O2 via [place] Neurological: She is alert and oriented to person, place, and time.  Gait normal  Skin: Skin is warm and dry. No pallor.  Psychiatric: She has a normal mood and affect. Her behavior is normal. Thought content normal.   Nursing note and vitals reviewed.   Vitals:   04/22/18 1530  BP: 136/70  Pulse: 71  Temp: 97.9 F (36.6 C)  SpO2: 93%      Assessment & Plan:    COPD --patient follows regularly with pulmonology.  She will continue wearing her oxygen.  She will continue all of her inhalers as already prescribed.  Patient aware to call office if she has any increasing symptoms of worsening shortness of breath.  Congestive heart failure- patient follows regularly with cardiology.  Patient was asking about information regarding a heart failure clinic that is located at [facility], patient given information of clinic location and phone number.  Patient advised to discuss her need to go to the heart failure clinic with cardiology.    HTN -- BP stable at this time  Lab work done in August 2019, offered to order lab work today to follow-up on abnormal labs that were obtained, but patient declines on having blood drawn at this time.  Patient  will follow-up in approximately 3 months for monitoring of chronic conditions and for new lab work at that time.  Patient's Prolia shot is also due in December 2019.  Patient advised she can return to clinic sooner if any issues arise.

## 2018-04-25 ENCOUNTER — Encounter: Payer: Self-pay | Admitting: Family Medicine

## 2018-04-26 ENCOUNTER — Other Ambulatory Visit: Payer: Self-pay | Admitting: Pharmacist

## 2018-04-26 DIAGNOSIS — I480 Paroxysmal atrial fibrillation: Secondary | ICD-10-CM | POA: Diagnosis not present

## 2018-04-26 DIAGNOSIS — Z7951 Long term (current) use of inhaled steroids: Secondary | ICD-10-CM | POA: Diagnosis not present

## 2018-04-26 DIAGNOSIS — I5042 Chronic combined systolic (congestive) and diastolic (congestive) heart failure: Secondary | ICD-10-CM | POA: Diagnosis not present

## 2018-04-26 DIAGNOSIS — J441 Chronic obstructive pulmonary disease with (acute) exacerbation: Secondary | ICD-10-CM | POA: Diagnosis not present

## 2018-04-26 DIAGNOSIS — Z87891 Personal history of nicotine dependence: Secondary | ICD-10-CM | POA: Diagnosis not present

## 2018-04-26 DIAGNOSIS — K219 Gastro-esophageal reflux disease without esophagitis: Secondary | ICD-10-CM | POA: Diagnosis not present

## 2018-04-26 DIAGNOSIS — Z8673 Personal history of transient ischemic attack (TIA), and cerebral infarction without residual deficits: Secondary | ICD-10-CM | POA: Diagnosis not present

## 2018-04-26 DIAGNOSIS — N183 Chronic kidney disease, stage 3 (moderate): Secondary | ICD-10-CM | POA: Diagnosis not present

## 2018-04-26 DIAGNOSIS — R55 Syncope and collapse: Secondary | ICD-10-CM | POA: Diagnosis not present

## 2018-04-26 DIAGNOSIS — I255 Ischemic cardiomyopathy: Secondary | ICD-10-CM | POA: Diagnosis not present

## 2018-04-26 DIAGNOSIS — Z955 Presence of coronary angioplasty implant and graft: Secondary | ICD-10-CM | POA: Diagnosis not present

## 2018-04-26 DIAGNOSIS — I130 Hypertensive heart and chronic kidney disease with heart failure and stage 1 through stage 4 chronic kidney disease, or unspecified chronic kidney disease: Secondary | ICD-10-CM | POA: Diagnosis not present

## 2018-04-26 DIAGNOSIS — J9611 Chronic respiratory failure with hypoxia: Secondary | ICD-10-CM | POA: Diagnosis not present

## 2018-04-26 DIAGNOSIS — E785 Hyperlipidemia, unspecified: Secondary | ICD-10-CM | POA: Diagnosis not present

## 2018-04-26 DIAGNOSIS — I2510 Atherosclerotic heart disease of native coronary artery without angina pectoris: Secondary | ICD-10-CM | POA: Diagnosis not present

## 2018-04-26 DIAGNOSIS — M810 Age-related osteoporosis without current pathological fracture: Secondary | ICD-10-CM | POA: Diagnosis not present

## 2018-04-26 DIAGNOSIS — Z7902 Long term (current) use of antithrombotics/antiplatelets: Secondary | ICD-10-CM | POA: Diagnosis not present

## 2018-04-26 DIAGNOSIS — Z7982 Long term (current) use of aspirin: Secondary | ICD-10-CM | POA: Diagnosis not present

## 2018-04-26 DIAGNOSIS — C3431 Malignant neoplasm of lower lobe, right bronchus or lung: Secondary | ICD-10-CM | POA: Diagnosis not present

## 2018-04-26 DIAGNOSIS — Z9981 Dependence on supplemental oxygen: Secondary | ICD-10-CM | POA: Diagnosis not present

## 2018-04-26 DIAGNOSIS — Z902 Acquired absence of lung [part of]: Secondary | ICD-10-CM | POA: Diagnosis not present

## 2018-04-26 NOTE — Patient Outreach (Signed)
[facility]) Care Management  04/26/2018  Stela Iwasaki Mcdade May 28, 1941 [id number]  77 y.o. year old female referred to [place] for medication assistance.   Return call and voicemail received from Ms. Bunyan.  Ms. Kujawa reports that she spoke with her pharmacy regarding price of Pulmicort and does not need financial assistance with co-pay.  She is appreciative of [facility] pharmacy efforts but declines medication concerns or issues at this time.   [facility] pharmacy case is being closed due to the following reasons:  The patient has chosen to discontinue services.  Patient has been provided [facility] CM contact information if assistance needed in the future.    Thank you for allowing [facility] pharmacy to be involved in this patient's care.    Ralene Bathe, PharmD, [place] [phone]

## 2018-04-27 ENCOUNTER — Ambulatory Visit: Payer: PPO | Admitting: Emergency Medicine

## 2018-04-27 ENCOUNTER — Other Ambulatory Visit: Payer: Self-pay

## 2018-04-27 NOTE — Patient Outreach (Signed)
[name]) Care Management  04/27/2018  [name] [birth year] [id number]  Transition of Care Outreach #3 Successful telephone encounter to the above for continued transition of care. Patient states "I am having a very good breathing day". Denies swelling of hands, feet, ankles, or abdomen. Shortness of breath with exertion and rest at baseline. Continues to be able to perform ADLs with minimal difficulty. Patient states she has gained 2 lbs over night but relates it to eating "something I should not have" last evening. Patient states she had chicken and dumplings for dinner. Weight has been stable at 129 for the past 4 days however she weighs 131 lbs this morning.  Patient states she has received her POC from [name] last week. She is able to be more active and mobile with the portable concentrator. She admits to it being heavier than she would like but she was shown by the Respiratory Therapist how to wear it to reduce its perceived weight.  Patient has had post hospitalization follow up with PCP. Notes reviewed by RN CM. Patient is not interested in visiting [name] clinic. "Dr Burt Knack is the only one I need to see". Patient has a follow up with her pulmonologist, Dr. Lamonte Sakai and Dr. Burt Knack tomorrow. Transportation to be provided by family.  Discussed with patient recent encounter with [name] pharmacy related to cost of Pulmicort. Patient states it only cost 27.00/month and that is affordable for her.  Plan:Tansition of care call next week  [name] CM Care Plan Problem One     Most Recent Value  Care Plan Problem One  High risk for readmission related to knowledge deficit about chronic health conditions of COPD and CHF  (Pended)   Role Documenting the Problem One  Care Management Coordinator  (Pended)   [place] for Problem One  Active  (Pended)   [facility] Long Term Goal   Over the next 31 days patient will not experience hospital readmission, as evidenced by patient  reporting and review of EMR  (Pended)   [place] Term Goal Start Date  04/11/18  (Pended)   [facility] CM Short Term Goal #1   Over the next 30 days patient will take all medications as prescribed as evidenced by patient reporting and review of EMR  (Pended)   [facility] CM Short Term Goal #1 Start Date  04/11/18  (Pended)   [facility] CM Short Term Goal #2   Over the next 30 days patient will complete all scheduled MD appointments as evidenced by patient reporting and review of EMR documentation  (Pended)   [facility] CM Short Term Goal #2 Start Date  04/11/18  (Pended)      Verlene Glantz E. Rollene Rotunda RN, BSN [facility] Care Management Coordinator [phone]

## 2018-04-28 ENCOUNTER — Encounter: Payer: Self-pay | Admitting: Cardiology

## 2018-04-28 ENCOUNTER — Ambulatory Visit: Payer: PPO | Admitting: Cardiology

## 2018-04-28 ENCOUNTER — Ambulatory Visit: Payer: PPO | Admitting: Emergency Medicine

## 2018-04-28 ENCOUNTER — Encounter: Payer: Self-pay | Admitting: Emergency Medicine

## 2018-04-28 VITALS — BP 110/60 | HR 68 | Ht 66.0 in | Wt 129.8 lb

## 2018-04-28 DIAGNOSIS — R918 Other nonspecific abnormal finding of lung field: Secondary | ICD-10-CM | POA: Diagnosis not present

## 2018-04-28 DIAGNOSIS — I5022 Chronic systolic (congestive) heart failure: Secondary | ICD-10-CM | POA: Diagnosis not present

## 2018-04-28 DIAGNOSIS — J449 Chronic obstructive pulmonary disease, unspecified: Secondary | ICD-10-CM

## 2018-04-28 NOTE — Assessment & Plan Note (Signed)
Based on their behavioral serial films I suspect that there is a slow-growing primary lung cancer present.  Again we discussed that I confirmed that we are going to watch and wait, not push for biopsy unless clinical symptoms dictate it. Next CT is in December.

## 2018-04-28 NOTE — Progress Notes (Signed)
Subjective:    Patient ID: [name], female    DOB: 06-May-1941, 77 y.o.   MRN: [id number]  HPI  ROV 02/17/18 --77 year old woman with a history of COPD, chronic cough with influences from esophageal reflux, adenocarcinoma of the lung with a right lower lobe superior segmental segmentectomy.  We have been following pulmonary nodules with serial imaging that have waxed and waned in size.  She is been dealing with chronic cough and purulent mucus production, has been treated on multiple occasions for bronchitis and/or flares.  She believes her cough is being impacted by labile GERD, that she was better when she was on Protonix twice a day.  She underwent swallowing eval 7/5 that showed distal spasms, ? contributing to reflux and cough. She has chronic hypoxemia but she does not always wear her oxygen with exertion. Her cough right now is well managed. Currently on protonix only qd. She has noticed some R flank and pleuritic pain.   A PET scan was done to follow her nodules and risk assess on 01/25/2018.  I have reviewed and this shows stable size of her pleural-based left lower lobe nodule but an increase in its hypermetabolic activity, now SUV 6.1.  Her pleural-based right lower lobe mass is also increased in size, has hypermetabolic activity.  There was no evidence of hypermetabolic lymphadenopathy.   ROV 03/10/18 --77 year old woman who follows up today for her COPD.  She has a history of adenocarcinoma of the lung with a right lower lobe superior segmentectomy in the past and now with bilateral pulmonary nodules that are slowly increasing in size by serial CT scans.  They were hypermetabolic on PET scan 12/31/8936.  She also has a history of chronic cough in episodes of bronchitis.  She has labile GERD even on Protonix, some distal spasming and possible aspiration.  I increased her Protonix to twice a day last visit.    Unfortunately she was just admitted the beginning of this month with syncope.  She  stated that it was preceded by dyspnea.  She was found to be in atrial fibrillation with RVR. She was walking outside without her O2.  She feels that she would do best with a POC, but not clear to me that she can tolerate pulsed O2.   She has considered the strategy of biopsy of her peripheral nodules, but she is concerned whether she can tolerate. I certainly understand this.   ROV 59/59/;37 --77 year old woman whom we follow for COPD, history of right lower lobe adenocarcinoma with superior segmentectomy, slowly enlarging bilateral pulmonary nodules that we have been following with CT and PET scans.  She also has a history of chronic cough GERD, PND, exertional hypoxemia.  We have deferred any biopsy or nodules unless she becomes symptomatic, plans to repeat her CT in November.  She was admitted since I saw her in August with dizziness, was found to have pulmonary edema on chest x-ray.  She was diuresed, continued on both Aldactone and furosemide. She is using pulmicort and Duonebs. She is now on pulsed flow O2 at 2L/min, has a small tank that lasts about 6 hours. She has some intermittent R pleuritic pain. She has had the flu shot. PNA shots UTD.    Review of Systems     As per history of present illness    Objective:   Physical Exam Vitals:   04/28/18 0914  BP: 124/68  Pulse: 77  SpO2: 95%  Weight: 130 lb (59 kg)  Height: 5'  6" (1.676 m)    Gen: Anxious, elderly woman, in mild resp distress  ENT: No lesions,  mouth clear,  oropharynx clear, no postnasal drip, weak hoarse voice  Neck: No JVD, no stridor  Lungs: tachypneic, small breaths but no wheezing. No wheeze on forced exp  Cardiovascular: RRR, heart sounds normal, no murmur or gallops, no peripheral edema.   Musculoskeletal: No deformities, no cyanosis or clubbing  Neuro: alert, non focal  Skin: Warm, no lesions or rashes      Assessment & Plan:  COPD (chronic obstructive pulmonary disease) (HCC) Her COPD appears to be  overall stable.  She was recently admitted for dizziness and some dyspnea but this seemed to be mostly vertigo and some increased pulmonary edema.  She benefited from diuresis.  We will continue her current bronchodilator regimen, DuoNeb and budesonide.  She is had a flu and pneumonia vaccines  Pulmonary nodules Based on their behavioral serial films I suspect that there is a slow-growing primary lung cancer present.  Again we discussed that I confirmed that we are going to watch and wait, not push for biopsy unless clinical symptoms dictate it. Next CT is in December.   Baltazar Apo, MD, PhD 04/28/2018, 9:44 AM [facility] [phone] or if no answer [phone]

## 2018-04-28 NOTE — Patient Instructions (Addendum)
Medication Instructions:   Your physician recommends that you continue on your current medications as directed. Please refer to the Current Medication list given to you today.   If you need a refill on your cardiac medications before your next appointment, please call your pharmacy.  Labwork: NONE ORDERED  TODAY    Testing/Procedures: NONE ORDERED  TODAY    Follow-Up:   WITH DR Burt Knack IN January    Any Other Special Instructions Will Be Listed Below (If Applicable).

## 2018-04-28 NOTE — Addendum Note (Signed)
Addended by: Desmond Dike C on: 04/28/2018 09:49 AM   Modules accepted: Orders

## 2018-04-28 NOTE — Patient Instructions (Signed)
Please continue your inhaled medications as you have been taking them, DuoNeb 4 times a day and budesonide (Pulmicort) 2 times a day. Continue your oxygen at 2 L/min via your pulsed system. We will repeat your CT scan of the chest in December to follow your pulmonary nodules. Flu shot and pneumonia shots are up-to-date. Follow with Dr. Lamonte Sakai in January to review your CT scan or sooner if you have any problems.

## 2018-04-28 NOTE — Progress Notes (Signed)
04/28/2018 [name]   08-04-1940  [id number]  Primary Physician Guse, Jacquelynn Cree, FNP Primary Cardiologist: Dr. Burt Knack Electrophysiologist: Dr. 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   Ms.Groomshas a complex history. She initially presented with an acute MI in 2010 treated with bare-metal stenting of the left circumflex. She has had recurrent non-STEMI in 2017 when she was found to have severe stenosis in the mid LAD treated with a drug-eluting stent. She presentedagain12/2018with an inferior STEMI and was found to have total occlusion of the distal right PDA. Attempts were made at aspiration and balloon angioplasty but flow was never able to be restored into the most distal part of the vessel. There was no high-grade proximal stenosis identified. She has had other problems including dizziness and bradycardia. She has frequent PVCs. She has chronic shortness of breath with a history of lung cancer and COPD. She is on chronic home O2.She is intolerant to Brilinta. This was discontinued in January and she was changed to Plavix. She has also been unable to tolerate beta blockers. She was on Coreg previously and this was discontinued. Also intolerant to Metoprolol. She isalsofollowed by Dr. Curt Bears for PVCs. Due to high burden and inability to tolerate beta  blockers, he recommended she start mexiletine however ptnoncompliant. She also had side effects and discontinued after 1 week of therapy. It is mentioned in telephone encounters that Dr. Burt Knack advised avoidance of amiodarone due to her lung issues.  Also of note, pt had a recentPET scan on 7/2/19whichshowed left lower lobe nodule essentially stable but increased in metabolic activity favoring malignancy, right lower lobe mass slightly larger from the previous imaging. This is being followed by Dr. Lamonte Sakai.  On 02/24/18 pt came to [facility] ED for syncope.Shewas out walking her dog morningof admitand suddenly became short of breath, then had syncope and collapse and woke up in the grass with her dog licking her face. Unwitnessed event.She deniedany associated CP or palpitations, but admitted that she was not wearing her supplemental O2 when she went for her walk. She was just a few steps from her door and was able to crawl in her house and call EMS.Per H&P that admit, EMS noted afib w/ RVR enroute to the ED, however there were no records to confirm this. W/u conducted and CT of headwasnegative. BG normal at 108. POC troponin negative. EKG showed SR w/ PVCs. UA negative. CBC unremarkable. Hgb 12.7. BMP unremarkable, SCr/BUN WNL. No signs of dehydration.K 4.6. Magnesium not checked.TSH normal. BNP elevated at 341.8. CXR showedright basilar density is noted which corresponds to possible mass and malignancy noted on prior PET scan. No other significant abnormality seen. Telemetry did show a9 beat run of NSVT and frequent PVCs/ bigeminy.Code statuswas changedDNR.  Cardiology evaluated her during that hospitalization. It was felt that there weremultiple possible etiologies for her syncopal  episode including acute hypoxemic respiratory failure due to COPD exacerbation and/or ventricular arrhythmia given her severely depressed left ventricular systolic function/ischemic cardiomyopathy.The  Hospitalist also felt that lasix may have contributed to her syncope and discontinued it. She was advised to continue all other cardiac meds including spironolactone.   Unfortunately, therewasnot much more tooffer herfrom a cardiac standpoint as she is intolerant to multiple medications as noted above. We did not recommend any additional cardiac w/u or therapies.  Pt was seen back in general cardiology clinic on 03/02/18 and denied any recurrent syncope. She had concerns about being off of Lasix for an extended period of time. Pt had endorsed her normal dry weight to be between 123-126 lb. Pt instructed to continue daily weights at home and to take PRN lasix if weight of 127 lb or more and to update our office. Pt reported adherence to diuretic dosing directions, however unfortunately developed acute on chronic systolic HF and was readmitted back to [facility]. Decision was made to put back on daily lasix for volume control, 20 mg daily. Also on spironolactone. Pt was discharged from hospital to SNF for temporary rehab. She is now back at home and here today for f/u.   She reports that she is feeling much better. No syncope, CP or increased dyspnea. Weight has remained stable at home around 127-129 lb. Compliant with diuretics. BP stable. Good UOP. She feels that her time at SNF for rehab really helped her. She now has HH PT.    Current Meds  Medication Sig  . albuterol (PROAIR HFA) 108 (90 Base) MCG/ACT inhaler Inhale 2 puffs into the lungs every 4 (four) hours as needed for wheezing or shortness of breath.  Marland Kitchen albuterol (PROVENTIL) (2.5 MG/3ML) 0.083% nebulizer solution Take 3 mLs (2.5 mg total) by nebulization every 4 (four) hours as needed for wheezing or shortness of breath.  Marland Kitchen aspirin EC 81 MG tablet Take 1 tablet (81 mg total) by mouth daily.  . budesonide (PULMICORT) 0.5 MG/2ML nebulizer solution Take 2 mLs (0.5 mg total) by nebulization 2 (two) times daily.  . calcium carbonate (OS-CAL - DOSED IN  MG OF ELEMENTAL CALCIUM) 1250 (500 Ca) MG tablet Take 1 tablet by mouth 2 (two) times daily with a meal.  . cholecalciferol (VITAMIN D) 1000 units tablet Take 1,000 Units by mouth daily.  . clopidogrel (PLAVIX) 75 MG tablet Take 1 tablet (75 mg total) by mouth daily.  Marland Kitchen denosumab (PROLIA) 60 MG/ML SOLN injection Inject 60 mg into the skin every 6 (six) months. Administer in upper arm, thigh, or abdomen  . fluticasone (FLONASE) 50 MCG/ACT nasal spray INSTILL 2 SPRAYS INTO BOTH NOSTRILS ONCEDAILY AS DIRECTED  . furosemide (LASIX) 20 MG tablet Take 1 tablet (20 mg total) by mouth daily.  Marland Kitchen ipratropium-albuterol (DUONEB) 0.5-2.5 (3) MG/3ML SOLN Take 3 mLs by nebulization every 6 (six) hours.  . isosorbide mononitrate (IMDUR) 30 MG 24 hr tablet TAKE 1 TABLET BY MOUTH DAILY  . loratadine (CLARITIN) 10 MG tablet Take 10 mg by mouth daily as needed for allergies or rhinitis.   . LOTEMAX 0.5 % ophthalmic suspension Place 1 drop into both eyes daily.   . meclizine (ANTIVERT) 25 MG tablet Take 25 mg by mouth as needed for dizziness.  . nitroGLYCERIN (NITROSTAT) 0.4 MG SL tablet Place 1 tablet (0.4 mg total) under the tongue every 5 (five) minutes as needed for chest pain.  . pantoprazole (PROTONIX) 40 MG tablet Take 1 tablet (40 mg total) by mouth 2 (  two) times daily.  . prednisoLONE acetate (PRED FORTE) 1 % ophthalmic suspension Place 1 drop into both eyes daily.   . simvastatin (ZOCOR) 40 MG tablet TAKE 1 TABLET BY MOUTH AT BEDTIME  . Spacer/Aero-Holding Chambers (AEROCHAMBER MV) inhaler Use as instructed  . spironolactone (ALDACTONE) 25 MG tablet Take 0.5 tablets (12.5 mg total) by mouth daily.   Current Facility-Administered Medications for the 04/28/18 encounter (Office Visit) with Consuelo Pandy, PA-C  Medication  . ipratropium-albuterol (DUONEB) 0.5-2.5 (3) MG/3ML nebulizer solution 3 mL   Allergies  Allergen Reactions  . Actonel [Risedronate Sodium] Other (See Comments)    Headache  .  Amlodipine Other (See Comments)    Pedal edema  . Brilinta [Ticagrelor] Other (See Comments)    Worsening dyspnea, malaise  . Codeine Rash  . Fosamax [Alendronate Sodium] Other (See Comments)    Unable to tolerate  . Lisinopril Cough  . Pravastatin Other (See Comments)    Constipation.  . Prednisone Other (See Comments)    Shaking (PILLS) Shot is ok   Past Medical History:  Diagnosis Date  . Arthritis   . CAD (coronary artery disease) 03/2009   s/p MI  . Chronic combined systolic and diastolic heart failure ([place]) 06/17/2009   Qualifier: Diagnosis of  By: Burt Knack, MD, Clayburn Pert   . CKD (chronic kidney disease) stage 3, GFR 30-59 ml/min (HCC)   . Emphysema/COPD ([place])   . GERD (gastroesophageal reflux disease)   . HCAP (healthcare-associated pneumonia) 03/19/2016  . Helicobacter pylori gastritis 10/2007   treated  . History of CVA (cerebrovascular accident) without residual deficits 01/2012, 06/2012   R hemorrhagic MCA 01/2012 with remote lacunar infarct L putamen and IC, rpt 06/2012 acute multifocal R MCA infarct with remote hemorrhagic strokes affecting L basal ganglia and periventricular white matter, full recovery  . HLD (hyperlipidemia)   . HTN (hypertension)   . Ischemic cardiomyopathy 02/27/2014  . Lung cancer ([place]) dx'd 07/2006   Lung CA, s/p resection, followed by Dr Earlie Server  . Macular degeneration   . Myocardial infarction [facility]) 03/2009   Acute myocardial infarction 2010 - treated with BMS of LCx. LVEF 50% with subsequent CHF  . NSTEMI (non-ST elevated myocardial infarction) ([place])   . Osteoporosis 09/2013   T -3.6 forearm 09/2013, T -4.5 forearm 04/2015  . Pre-diabetes   . Syncope 02/24/2018   Family History  Problem Relation Age of Onset  . CAD Mother 34       MI  . Stroke Mother   . Hypertension Mother   . Heart attack Mother   . CAD Father 51  . Stroke Father   . Breast cancer Sister   . Diabetes Sister    Past Surgical History:  Procedure Laterality Date    . BREAST BIOPSY Left   . CARDIAC CATHETERIZATION N/A 02/24/2016   Procedure: Left Heart Cath and Coronary Angiography;  Surgeon: Belva Crome, MD;  Location: [place] CV [facility];  Service: Cardiovascular;  Laterality: N/A;  . CARDIAC CATHETERIZATION N/A 07/09/2016   Procedure: Left Heart Cath and Coronary Angiography;  Surgeon: Peter M Martinique, MD;  Location: [place] CV [facility];  Service: Cardiovascular;  Laterality: N/A;  . CARDIAC CATHETERIZATION N/A 07/09/2016   Procedure: Coronary Stent Intervention;  Surgeon: Peter M Martinique, MD;  Location: [place] CV LAB;  Service: Cardiovascular;  Laterality: N/A;  . CATARACT EXTRACTION Bilateral   . CORONARY STENT INTERVENTION N/A 07/06/2017   Procedure: CORONARY STENT INTERVENTION;  Surgeon: Lorretta Harp, MD;  Location: [place] CV [facility];  Service: Cardiovascular;  Laterality: N/A;  . CORONARY STENT PLACEMENT  06/2016   DES to mid LAD  . CORONARY/GRAFT ACUTE MI REVASCULARIZATION N/A 07/06/2017   Procedure: Coronary/Graft Acute MI Revascularization;  Surgeon: Lorretta Harp, MD;  Location: [place] [facility];  Service: Cardiovascular;  Laterality: N/A;  . EYE SURGERY    . LEFT HEART CATH AND CORONARY ANGIOGRAPHY N/A 07/06/2017   Procedure: LEFT HEART CATH AND CORONARY ANGIOGRAPHY;  Surgeon: Lorretta Harp, MD;  Location: [place] [facility];  Service: Cardiovascular;  Laterality: N/A;  . LUNG REMOVAL, PARTIAL Right 2008  . PARTIAL HYSTERECTOMY  1986   irregular periods, ovaries remain   Social History   Socioeconomic History  . Marital status: Widowed    Spouse name: Not on file  . Number of children: 1  . Years of education: 33  . Highest education level: High school graduate  Occupational History  . Occupation: retired    Comment: insurance  Social Needs  . Financial resource strain: Not hard at all  . Food insecurity:    Worry: Never true    Inability: Never true  . Transportation needs:    Medical: No    Non-medical:  No  Tobacco Use  . Smoking status: Former Smoker    Packs/day: 0.50    Years: 42.00    Pack years: 21.00    Types: Cigarettes    Last attempt to quit: 07/27/2002    Years since quitting: 15.7  . Smokeless tobacco: Never Used  Substance and Sexual Activity  . Alcohol use: No    Alcohol/week: 0.0 standard drinks  . Drug use: No  . Sexual activity: Never  Lifestyle  . Physical activity:    Days per week: 0 days    Minutes per session: 0 min  . Stress: Not at all  Relationships  . Social connections:    Talks on phone: More than three times a week    Gets together: Twice a week    Attends religious service: 1 to 4 times per year    Active member of club or organization: No    Attends meetings of clubs or organizations: Never    Relationship status: Widowed  . Intimate partner violence:    Fear of current or ex partner: Patient refused    Emotionally abused: Patient refused    Physically abused: Patient refused    Forced sexual activity: Patient refused  Other Topics Concern  . Not on file  Social History Narrative   The patient is a widow and has one son and one dog.     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  She does not drink alcohol. 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   Ed: HS   Activity: no regular exercise   Diet: some water, some fruits/vegetables    Lives in a one story home.      Review of Systems: General: negative for chills, fever, night sweats or weight changes.  Cardiovascular: negative for chest pain, dyspnea on exertion, edema, orthopnea, palpitations, paroxysmal nocturnal dyspnea or shortness of breath Dermatological: negative for rash Respiratory: negative for cough or wheezing Urologic: negative for hematuria Abdominal: negative for nausea, vomiting, diarrhea, bright red blood per rectum, melena, or hematemesis Neurologic: negative for visual changes, syncope, or dizziness All other  systems reviewed and are otherwise negative except as noted  above.   Physical Exam:  Blood pressure 110/60, pulse 68, height 5\' 6"  (1.676 m), weight 129 lb 12.8 oz (58.9 kg), SpO2 94 %.  General appearance: alert, cooperative and no distress Neck: no adenopathy, no carotid bruit and no JVD Lungs: clear to auscultation bilaterally Heart: regular rate and rhythm, S1, S2 normal, no murmur, click, rub or gallop Extremities: extremities normal, atraumatic, no cyanosis or edema Pulses: 2+ and symmetric Skin: Skin color, texture, turgor normal. No rashes or lesions Neurologic: Grossly normal  EKG not performed -- personally reviewed   ASSESSMENT AND PLAN:   77 y.o.female w/ complex medical history, CODE status DNR,with a hx of CAD s/p MI x3 with stenting of the LAD, LCx and known RCA disease, high burden PVCs but intolerant to beta blockers due to fatigue, history of lung cancer and emphysema limiting use of amiodarone, chronic hypoxic respiratory failure on chronic home O2 (baseline 4 L/min), possibility of recurrent lung CA based on recent CT scan, ischemic CM w/ EF of 30-35%, not a candidate for ICD, HTN, HLD, prediabetes, CKD, prior CVA, and recent hospitalizations for syncope and acute on chronic systolic HF, late this summer.   Diuretics restarted after last admission and she is doing much better. Stable from a cardiac standpoint. No cardiac symptoms since discharge. Volume has remained stable. VSS. Will continue current med regimen. No changes made today. We discussed continuation of diuretics, daily weights and low sodium diet. F/u with Dr. Burt Knack in 3-4 months or sooner if needed.      [name], MHS [facility] 04/28/2018 11:38 AM

## 2018-04-28 NOTE — Assessment & Plan Note (Signed)
Her COPD appears to be overall stable.  She was recently admitted for dizziness and some dyspnea but this seemed to be mostly vertigo and some increased pulmonary edema.  She benefited from diuresis.  We will continue her current bronchodilator regimen, DuoNeb and budesonide.  She is had a flu and pneumonia vaccines

## 2018-04-29 DIAGNOSIS — Z955 Presence of coronary angioplasty implant and graft: Secondary | ICD-10-CM | POA: Diagnosis not present

## 2018-04-29 DIAGNOSIS — Z87891 Personal history of nicotine dependence: Secondary | ICD-10-CM | POA: Diagnosis not present

## 2018-04-29 DIAGNOSIS — J441 Chronic obstructive pulmonary disease with (acute) exacerbation: Secondary | ICD-10-CM | POA: Diagnosis not present

## 2018-04-29 DIAGNOSIS — I2510 Atherosclerotic heart disease of native coronary artery without angina pectoris: Secondary | ICD-10-CM | POA: Diagnosis not present

## 2018-04-29 DIAGNOSIS — R55 Syncope and collapse: Secondary | ICD-10-CM | POA: Diagnosis not present

## 2018-04-29 DIAGNOSIS — I5042 Chronic combined systolic (congestive) and diastolic (congestive) heart failure: Secondary | ICD-10-CM | POA: Diagnosis not present

## 2018-04-29 DIAGNOSIS — Z7951 Long term (current) use of inhaled steroids: Secondary | ICD-10-CM | POA: Diagnosis not present

## 2018-04-29 DIAGNOSIS — I480 Paroxysmal atrial fibrillation: Secondary | ICD-10-CM | POA: Diagnosis not present

## 2018-04-29 DIAGNOSIS — Z902 Acquired absence of lung [part of]: Secondary | ICD-10-CM | POA: Diagnosis not present

## 2018-04-29 DIAGNOSIS — E785 Hyperlipidemia, unspecified: Secondary | ICD-10-CM | POA: Diagnosis not present

## 2018-04-29 DIAGNOSIS — J9611 Chronic respiratory failure with hypoxia: Secondary | ICD-10-CM | POA: Diagnosis not present

## 2018-04-29 DIAGNOSIS — Z7982 Long term (current) use of aspirin: Secondary | ICD-10-CM | POA: Diagnosis not present

## 2018-04-29 DIAGNOSIS — M810 Age-related osteoporosis without current pathological fracture: Secondary | ICD-10-CM | POA: Diagnosis not present

## 2018-04-29 DIAGNOSIS — K219 Gastro-esophageal reflux disease without esophagitis: Secondary | ICD-10-CM | POA: Diagnosis not present

## 2018-04-29 DIAGNOSIS — Z7902 Long term (current) use of antithrombotics/antiplatelets: Secondary | ICD-10-CM | POA: Diagnosis not present

## 2018-04-29 DIAGNOSIS — C3431 Malignant neoplasm of lower lobe, right bronchus or lung: Secondary | ICD-10-CM | POA: Diagnosis not present

## 2018-04-29 DIAGNOSIS — Z8673 Personal history of transient ischemic attack (TIA), and cerebral infarction without residual deficits: Secondary | ICD-10-CM | POA: Diagnosis not present

## 2018-04-29 DIAGNOSIS — Z9981 Dependence on supplemental oxygen: Secondary | ICD-10-CM | POA: Diagnosis not present

## 2018-04-29 DIAGNOSIS — I255 Ischemic cardiomyopathy: Secondary | ICD-10-CM | POA: Diagnosis not present

## 2018-04-29 DIAGNOSIS — N183 Chronic kidney disease, stage 3 (moderate): Secondary | ICD-10-CM | POA: Diagnosis not present

## 2018-04-29 DIAGNOSIS — I130 Hypertensive heart and chronic kidney disease with heart failure and stage 1 through stage 4 chronic kidney disease, or unspecified chronic kidney disease: Secondary | ICD-10-CM | POA: Diagnosis not present

## 2018-05-04 ENCOUNTER — Other Ambulatory Visit: Payer: Self-pay

## 2018-05-04 NOTE — Patient Outreach (Signed)
[place] [facility]) Care Management  05/04/2018  [name] Mar 19, 1941 [id number]  Transition of Care Outreach #4 (attempt) Unsuccessful attempt to reach the above patient for week 4 transition of care call. HIPAA compliant VM message left requesting call back.   Plan: Will plan second telephone outreach in 2 days if patient does not return call.  Maricel Swartzendruber E. Rollene Rotunda RN, BSN [facility] Care Management Coordinator [phone]

## 2018-05-05 ENCOUNTER — Ambulatory Visit: Payer: PPO

## 2018-05-06 ENCOUNTER — Other Ambulatory Visit: Payer: Self-pay

## 2018-05-06 NOTE — Patient Outreach (Signed)
[place] [facility]) Care Management  05/06/2018  [name] Hillary 05/17/1941 [id number]  Transition of Care Outreach # 4 Successful telephone encounter to the above for continued transition of care. Patient states she is doing "good". She continues to weigh daily and record. Weight today is at baseline of 128. Patient denies swelling in hands, feet, legs, or abdomen. States "I am breathing good today". BP 122/69, HR 65. RN CM continues to reinforce CHF action plan. Patient understands to call Dr. York Cerise office with weight gain of 3 lbs overnight or 5 lbs in a week, or any symptoms in the yellow zone. Patient is now more conscious of her sodium intake and is reading labels. She is now able to relate increased sodium intake with increased HF symptoms.   Patient denies medication changes. Niece continues to fill pill box weekly. Patient verbalizes continued compliance with medication regimen.  Patient denies new or recent falls since last encounter. She continues to be able to complete ADLs independently with minimal difficulty. She continues to use O2 continuously and has a POC for when she leaves the home.  Plan: Will follow up with patient in 1 week to complete transition of care calls.  THN CM Care Plan Problem One     Most Recent Value  Care Plan Problem One  High risk for readmission related to knowledge deficit about chronic health conditions of COPD and CHF  Role Documenting the Problem One  Care Management Coordinator  Care Plan for Problem One  Active  THN Long Term Goal   Over the next 31 days patient will not experience hospital readmission, as evidenced by patient reporting and review of EMR  THN Long Term Goal Start Date  04/11/18  Interventions for Problem One Long Term Goal  reinforced daily weights and CHF yellow zone action plan  THN CM Short Term Goal #1   Over the next 30 days patient will take all medications as prescribed as evidenced by patient reporting and  review of EMR  THN CM Short Term Goal #1 Start Date  04/11/18  Interventions for Short Term Goal #1  reviewed medications and assessed for changes  THN CM Short Term Goal #2   Over the next 30 days patient will complete all scheduled MD appointments as evidenced by patient reporting and review of EMR documentation  THN CM Short Term Goal #2 Start Date  04/11/18  Interventions for Short Term Goal #2  patient has completed all scheduled follow up appointments for the next 30 days. Reinforced importance of requesting "sick" appointment for s/s of respiratory infection or fluid overload     Abryanna Musolino E. Rollene Rotunda RN, BSN [facility] Care Management Coordinator [phone]

## 2018-05-09 ENCOUNTER — Other Ambulatory Visit: Payer: Self-pay | Admitting: Cardiovascular Disease

## 2018-05-10 ENCOUNTER — Other Ambulatory Visit: Payer: Self-pay

## 2018-05-10 NOTE — Patient Outreach (Signed)
[place] [facility]) Care Management  05/10/2018  [name] March 01, 1941 [id number]  Telephone Assessment/Care Coordination: Successful telephone encounter to the above patient post transition of care discontinuation 05/08/18 as patient is 30 days post SNF discharge. Patient states she is continuing to do well. "I had a nice quiet weekend". Weight remains stable at 127 this morning. Patient continues to take medications as directed and monitors her intake of high sodium foods. She denies any swelling of hands, feet, ankles, or abdomen, and feels breathing is at her baseline. RN CM continues to reinforce CHF action plan.  Plan: Transition of care calls discontinued. 30 day home visit scheduled for Friday 10/25 at 12:30pm.  [name] E. Rollene Rotunda RN, BSN [facility] Care Management Coordinator [phone]

## 2018-05-16 ENCOUNTER — Other Ambulatory Visit: Payer: Self-pay | Admitting: Emergency Medicine

## 2018-05-20 ENCOUNTER — Other Ambulatory Visit: Payer: Self-pay

## 2018-05-20 NOTE — Patient Outreach (Signed)
[facility]) Care Management   05/20/2018  Rosalyn Archambault Vahle Dec 12, 1940 [id number]  Dawnetta Copenhaver Vonderhaar is an 77 y.o. female  Subjective: "Im doing fine. I was able to go to [employer] last week, ride on a scooter and shop for the first time in a long time. I was so proud of myself that I did it".  Objective:   BP 110/60 (BP Location: Left Arm, Patient Position: Standing, Cuff Size: Normal)   Pulse 73   Resp 19   Wt 125 lb (56.7 kg) Comment: reported morning weight  SpO2 97%   BMI 20.18 kg/m   Physical Exam  Constitutional: She is oriented to person, place, and time. She appears well-developed.  frail  Cardiovascular: Normal rate, regular rhythm, normal heart sounds and intact distal pulses.  Respiratory: No respiratory distress.  diminished breath sounds throughout per patients norm Per patient, shortness of breath at baseline Continuous oxygen at 2lpm via nasal cannula  GI: Soft. Bowel sounds are normal. She exhibits no distension.  Musculoskeletal: Normal range of motion. She exhibits no edema.  Neurological: She is alert and oriented to person, place, and time.  Skin: Skin is warm and dry.  Psychiatric: She has a normal mood and affect. Her behavior is normal. Judgment and thought content normal.    Encounter Medications:   Outpatient Encounter Medications as of 05/20/2018  Medication Sig  . albuterol (PROAIR HFA) 108 (90 Base) MCG/ACT inhaler Inhale 2 puffs into the lungs every 4 (four) hours as needed for wheezing or shortness of breath.  Marland Kitchen albuterol (PROVENTIL) (2.5 MG/3ML) 0.083% nebulizer solution Take 3 mLs (2.5 mg total) by nebulization every 4 (four) hours as needed for wheezing or shortness of breath.  Marland Kitchen aspirin EC 81 MG tablet Take 1 tablet (81 mg total) by mouth daily.  . budesonide (PULMICORT) 0.5 MG/2ML nebulizer solution Take 2 mLs (0.5 mg total) by nebulization 2 (two) times daily.  . calcium carbonate (OS-CAL - DOSED IN MG OF ELEMENTAL CALCIUM) 1250  (500 Ca) MG tablet Take 1 tablet by mouth 2 (two) times daily with a meal.  . cholecalciferol (VITAMIN D) 1000 units tablet Take 1,000 Units by mouth daily.  . clopidogrel (PLAVIX) 75 MG tablet Take 1 tablet (75 mg total) by mouth daily.  Marland Kitchen denosumab (PROLIA) 60 MG/ML SOLN injection Inject 60 mg into the skin every 6 (six) months. Administer in upper arm, thigh, or abdomen  . fluticasone (FLONASE) 50 MCG/ACT nasal spray INSTILL 2 SPRAYS INTO BOTH NOSTRILS ONCEDAILY AS DIRECTED  . furosemide (LASIX) 20 MG tablet Take 1 tablet (20 mg total) by mouth daily.  Marland Kitchen ipratropium-albuterol (DUONEB) 0.5-2.5 (3) MG/3ML SOLN INHALE 1 VIAL VIA NEBULIZER FOUR TIMES ADAY OR EVERY 4 HOURS AS NEEDED AS DIRECTED.  Marland Kitchen isosorbide mononitrate (IMDUR) 30 MG 24 hr tablet TAKE 1 TABLET BY MOUTH ONCE DAILY  . loratadine (CLARITIN) 10 MG tablet Take 10 mg by mouth daily as needed for allergies or rhinitis.   . LOTEMAX 0.5 % ophthalmic suspension Place 1 drop into both eyes daily.   . meclizine (ANTIVERT) 25 MG tablet Take 25 mg by mouth as needed for dizziness.  . nitroGLYCERIN (NITROSTAT) 0.4 MG SL tablet Place 1 tablet (0.4 mg total) under the tongue every 5 (five) minutes as needed for chest pain.  . pantoprazole (PROTONIX) 40 MG tablet Take 1 tablet (40 mg total) by mouth 2 (two) times daily.  . prednisoLONE acetate (PRED FORTE) 1 % ophthalmic suspension Place 1 drop into  both eyes daily.   . simvastatin (ZOCOR) 40 MG tablet TAKE 1 TABLET BY MOUTH AT BEDTIME  . Spacer/Aero-Holding Chambers (AEROCHAMBER MV) inhaler Use as instructed  . spironolactone (ALDACTONE) 25 MG tablet Take 0.5 tablets (12.5 mg total) by mouth daily.   Facility-Administered Encounter Medications as of 05/20/2018  Medication  . ipratropium-albuterol (DUONEB) 0.5-2.5 (3) MG/3ML nebulizer solution 3 mL    Functional Status:   In your present state of health, do you have any difficulty performing the following activities: 04/18/2018 03/18/2018   Hearing? N -  Vision? N -  Difficulty concentrating or making decisions? N -  Walking or climbing stairs? Y -  Comment yes related to shortess of breath from chf and copd -  Dressing or bathing? N -  Doing errands, shopping? N N  Comment but patient states "it wears me out" -  Preparing Food and eating ? N -  Using the Toilet? N -  In the past six months, have you accidently leaked urine? Y -  Do you have problems with loss of bowel control? N -  Managing your Medications? Y -  Comment niece fixes pill box -  Managing your Finances? N -  Housekeeping or managing your Housekeeping? N -  Some recent data might be hidden    Fall/Depression Screening:    Fall Risk  04/18/2018 11/26/2017 09/10/2017  Falls in the past year? Yes No No  Number falls in past yr: 2 or more - -  Injury with Fall? No - -  Risk Factor Category  High Fall Risk - -  Risk for fall due to : History of fall(s) - -  Follow up Falls evaluation completed;Falls prevention discussed;Education provided - -   PHQ 2/9 Scores 04/18/2018 11/26/2017 09/10/2017 11/25/2016 03/31/2016 03/09/2016 03/09/2016  PHQ - 2 Score 0 0 0 0 0 0 0  PHQ- 9 Score - 0 - - - 0 -    Assessment: Patient is pleasant and well groomed. Met RN CM at door with minimal effort and baseline shortness of breath. Continues to wear oxygen at 2 lpm continuously. Denies pain or discomforts. She is extremely proud of herself for being more mobile now that she has her POC. She is now driving short distances. She is able to do her own grocery shopping and drive to medical appointments locally.  Patient continues to follow the CHF action plan. She weighs herself daily, check BP/HR and records. She continues to medications as prescribed. Niece continues to fill pillbox weekly. Patient able to verbalize action plan to call MD if she has any symptoms in the yellow zone (weight gain of 3 lbs over night or 5 lbs in a week, more shortness of breath, swelling in legs, feet, ankles,  abdomen). She is now reading food lables and limits herself to 2055m of sodium daily. She states she has seen a big difference in how she feels since she has been monitoring her sodium intake over the last month.  Patient has a comorbidity of COPD. Reviewed COPD action plan today. Patient educated on yellow zone and to call Dr. BLamonte Sakaiif symptoms of COPD flare do not improved in 48 hours. Patient aware of seeking emergency care for [place].   Patient is doing well over all. Per patient she is at her baseline for her COPD and CHF. She has no more scheduled follow up visits with cardiology or pulmonology this year. She admits to having her flu vaccine.  Plan: Continue monthly follow up  [facility] CM Care Plan Problem Two     Most Recent Value  Care Plan Problem Two  CHF-self care  Role Documenting the Problem Two  Care Management Coordinator  Care Plan for Problem Two  Active  Interventions for Problem Two Long Term Goal   reviewed role of sodium intake on weight fluxiation and importance of monitoring  [facility] Long Term Goal  Over the next 31 days, patient will weigh and record daily as evidenced by patient reporting and visualization of documented weights during home visit  [place] Term Goal Start Date  05/10/18  [facility] CM Short Term Goal #1   Over the next 30 days, patient will be able to verbalize foods high in sodium that should be consumed in moderation on a low sodium diet  [facility] CM Short Term Goal #1 Start Date  05/10/18  Interventions for Short Term Goal #2   reviewed lable reading with patient and discussed maximu sodium intake amount (2026m)/day  [facility] CM Short Term Goal #2   Over the next 3 weeks, patient will utilize the 24 hours nurse advice line as need as evidenced by patient reporting  [facility] CM Short Term Goal #2 Start Date  05/20/18  Interventions for Short Term Goal #2  reviewed with patient available resources for medical information/questions including MD offices and [facility] advice  line     Charod Slawinski E. PRollene RotundaRN, BSN TSen[facility] LLCCare Management Coordinator [phone]

## 2018-05-21 DIAGNOSIS — J449 Chronic obstructive pulmonary disease, unspecified: Secondary | ICD-10-CM | POA: Diagnosis not present

## 2018-05-21 DIAGNOSIS — R269 Unspecified abnormalities of gait and mobility: Secondary | ICD-10-CM | POA: Diagnosis not present

## 2018-05-21 DIAGNOSIS — E785 Hyperlipidemia, unspecified: Secondary | ICD-10-CM | POA: Diagnosis not present

## 2018-05-21 DIAGNOSIS — R911 Solitary pulmonary nodule: Secondary | ICD-10-CM | POA: Diagnosis not present

## 2018-05-23 ENCOUNTER — Other Ambulatory Visit: Payer: Self-pay | Admitting: Cardiovascular Disease

## 2018-06-02 ENCOUNTER — Telehealth: Payer: Self-pay | Admitting: Emergency Medicine

## 2018-06-02 DIAGNOSIS — R933 Abnormal findings on diagnostic imaging of other parts of digestive tract: Secondary | ICD-10-CM

## 2018-06-02 NOTE — Telephone Encounter (Signed)
[facility]. Pt is requesting that CT be performed in [place] instead.  PCC's can you guys help with this?

## 2018-06-06 NOTE — Telephone Encounter (Signed)
Mrs. [name] CT appt has been rescheduled at [facility] on 07/18/2018 @ 11:30am and she now has her ROV with Dr. Lamonte Sakai scheduled as well. Mrs. [name] is aware of the appt change and location

## 2018-06-06 NOTE — Telephone Encounter (Signed)
According to Central Scheduling a new CT order has to be put in before we can change locations

## 2018-06-06 NOTE — Telephone Encounter (Signed)
Ok order placed for [facility]

## 2018-06-06 NOTE — Telephone Encounter (Signed)
[facility] - please advise if you can change this for the pt. Thanks.

## 2018-06-14 ENCOUNTER — Telehealth: Payer: Self-pay | Admitting: Emergency Medicine

## 2018-06-14 ENCOUNTER — Encounter: Payer: Self-pay | Admitting: Nurse Practitioner

## 2018-06-14 ENCOUNTER — Ambulatory Visit: Payer: PPO | Admitting: Nurse Practitioner

## 2018-06-14 VITALS — BP 114/66 | HR 54 | Temp 98.2°F | Ht 66.0 in | Wt 130.6 lb

## 2018-06-14 DIAGNOSIS — R059 Cough, unspecified: Secondary | ICD-10-CM

## 2018-06-14 DIAGNOSIS — J449 Chronic obstructive pulmonary disease, unspecified: Secondary | ICD-10-CM | POA: Diagnosis not present

## 2018-06-14 DIAGNOSIS — R918 Other nonspecific abnormal finding of lung field: Secondary | ICD-10-CM

## 2018-06-14 DIAGNOSIS — J9611 Chronic respiratory failure with hypoxia: Secondary | ICD-10-CM

## 2018-06-14 DIAGNOSIS — R05 Cough: Secondary | ICD-10-CM

## 2018-06-14 DIAGNOSIS — J0100 Acute maxillary sinusitis, unspecified: Secondary | ICD-10-CM | POA: Diagnosis not present

## 2018-06-14 MED ORDER — METHYLPREDNISOLONE ACETATE 80 MG/ML IJ SUSP
80.0000 mg | Freq: Once | INTRAMUSCULAR | Status: AC
  Administered 2018-06-14: 80 mg via INTRAMUSCULAR

## 2018-06-14 NOTE — Patient Instructions (Addendum)
Will order Augmentin  DepoMedrol in office today Samples of mucinex  Samples of delsym given in office today  General instructions: Hydrate  Drink enough water to keep your pee (urine) clear or pale yellow. Rest  Rest as much as possible.  Sleep with your head raised (elevated).  Make sure to get enough sleep each night. General instructions  Put a warm, moist washcloth on your face 3-4 times a day or as told by your doctor. This will help with discomfort.  Wash your hands often with soap and water. If there is no soap and water, use hand sanitizer.  Do not smoke. Avoid being around people who are smoking (secondhand smoke). Call the office if:  You have a fever.  Your symptoms get worse.  Your symptoms do not get better within 10 days.   Remember to repeat your CT scan of the chest in December to follow your pulmonary nodules. Already ordered by Dr. Lamonte Sakai.  Flu shot and pneumonia shots are up-to-date. Follow with Dr. Lamonte Sakai in January to review your CT scan or sooner if you have any problems.

## 2018-06-14 NOTE — Assessment & Plan Note (Signed)
  Remember to repeat your CT scan of the chest in December to follow your pulmonary nodules. Already ordered by Dr. Lamonte Sakai.  Flu shot and pneumonia shots are up-to-date. Follow with Dr. Lamonte Sakai in January to review your CT scan or sooner if you have any problems.

## 2018-06-14 NOTE — Assessment & Plan Note (Signed)
Patient Instructions  Will order Augmentin  DepoMedrol in office today Samples of mucinex  Samples of delsym given in office today Remember to repeat your CT scan of the chest in December to follow your pulmonary nodules. Already ordered by Dr. Lamonte Sakai.  Flu shot and pneumonia shots are up-to-date. Follow with Dr. Lamonte Sakai in January to review your CT scan or sooner if you have any problems.

## 2018-06-14 NOTE — Telephone Encounter (Signed)
Called and spoke with patient, she stated that she is having congestion and a cough with increased SOB. She is wanting an appointment. Patient has been scheduled for this afternoon with Aaron Edelman. Nothing further needed.

## 2018-06-14 NOTE — Assessment & Plan Note (Signed)
Patient Instructions  Will order Augmentin  DepoMedrol in office today Samples of mucinex  Samples of delsym given in office today  General instructions: Hydrate  Drink enough water to keep your pee (urine) clear or pale yellow. Rest  Rest as much as possible.  Sleep with your head raised (elevated).  Make sure to get enough sleep each night. General instructions  Put a warm, moist washcloth on your face 3-4 times a day or as told by your doctor. This will help with discomfort.  Wash your hands often with soap and water. If there is no soap and water, use hand sanitizer.  Do not smoke. Avoid being around people who are smoking (secondhand smoke). Call the office if:  You have a fever.  Your symptoms get worse.  Your symptoms do not get better within 10 days.  Remember to repeat your CT scan of the chest in December to follow your pulmonary nodules. Already ordered by Dr. Lamonte Sakai.  Flu shot and pneumonia shots are up-to-date. Follow with Dr. Lamonte Sakai in January to review your CT scan or sooner if you have any problems.

## 2018-06-14 NOTE — Assessment & Plan Note (Signed)
Continue oxygen at 2 L

## 2018-06-14 NOTE — Progress Notes (Signed)
@Patient  ID: [name], female    DOB: 05/18/1941, 77 y.o.   MRN: [id number]  Chief Complaint  Patient presents with  . 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   Tests: CXR 03/18/18 - Development of moderate pulmonary edema and small pleural effusions since prior exam. Pleural-based right and left lower lobe pulmonary masses on prior PET are faintly visualized radiographically.  OV 77/19/19 - acute - congestion/cough Patient presents with sinus congestion and pressure. She has a cough that is productive of brown sputum. She does complain of some shortness of breath, but has not had to increase his O2. States that symptoms started 3 days ago and have progressively worsened. She complains of post nasal drip. She is compliant with duo neb and Pulmicort. She is on O2 at 2 L pulsed system.  Her weight is stable. She denies fever, edema, or chest pain.    Allergies  Allergen Reactions  . Actonel [Risedronate Sodium] Other (See Comments)    Headache  . Amlodipine Other (See Comments)    Pedal edema  . Brilinta [Ticagrelor] Other (See Comments)    Worsening dyspnea, malaise  . Codeine Rash  . Fosamax [Alendronate Sodium] Other (See Comments)    Unable to tolerate  . Lisinopril Cough  . Pravastatin Other (See Comments)    Constipation.  . Prednisone Other (See Comments)    Shaking (PILLS) Shot is ok    Immunization History  Administered Date(s) Administered  . Influenza Split 03/29/2012, 05/08/2013  . Influenza, High Dose Seasonal PF 04/06/2017, 04/13/2018  . Influenza,inj,Quad PF,6+ Mos 02/24/2014, 04/08/2015, 04/01/2016, 04/06/2018  . Pneumococcal Conjugate-13 05/08/2015  . Pneumococcal Polysaccharide-23 07/27/2009  .  Zoster 07/28/2011  . Zoster Recombinat (Shingrix) 12/09/2017, 01/14/2018, 05/18/2018    Past Medical History:  Diagnosis Date  . Arthritis   . CAD (coronary artery disease) 03/2009   s/p MI  . Chronic combined systolic and diastolic heart failure ([place]) 06/17/2009   Qualifier: Diagnosis of  By: Burt Knack, MD, Clayburn Pert   . CKD (chronic kidney disease) stage 3, GFR 30-59 ml/min (HCC)   . Emphysema/COPD ([place])   . GERD (gastroesophageal reflux disease)   . HCAP (healthcare-associated pneumonia) 03/19/2016  . Helicobacter pylori gastritis 10/2007   treated  . History of CVA (cerebrovascular accident) without residual deficits 01/2012, 06/2012   R hemorrhagic MCA 01/2012 with remote lacunar infarct L putamen and IC, rpt 06/2012 acute multifocal R MCA infarct with remote hemorrhagic strokes affecting L basal ganglia and periventricular white matter, full recovery  . HLD (hyperlipidemia)   . HTN (hypertension)   . Ischemic cardiomyopathy 02/27/2014  . Lung cancer ([place]) dx'd 07/2006   Lung CA, s/p resection, followed by Dr Earlie Server  . Macular degeneration   . Myocardial infarction [facility]) 03/2009   Acute myocardial infarction 2010 - treated with BMS of LCx. LVEF 50% with subsequent CHF  . NSTEMI (non-ST elevated myocardial infarction) ([place])   . Osteoporosis 09/2013   T -3.6 forearm 09/2013, T -4.5 forearm 04/2015  . Pre-diabetes   . Syncope 02/24/2018    Tobacco History: Social History   Tobacco Use  Smoking Status Former Smoker  . Packs/day: 0.50  . Years: 42.00  . Pack years: 21.00  . Types: Cigarettes  . Last attempt to quit:  07/27/2002  . Years since quitting: 15.8  Smokeless Tobacco Never Used   Counseling given: Yes   Outpatient Encounter Medications as of 06/14/2018  Medication Sig  . albuterol (PROAIR HFA) 108 (90 Base) MCG/ACT inhaler Inhale 2 puffs into the lungs every 4 (four) hours as needed for wheezing or shortness of breath.  Marland Kitchen albuterol (PROVENTIL) (2.5 MG/3ML) 0.083%  nebulizer solution Take 3 mLs (2.5 mg total) by nebulization every 4 (four) hours as needed for wheezing or shortness of breath.  Marland Kitchen aspirin EC 81 MG tablet Take 1 tablet (81 mg total) by mouth daily.  . budesonide (PULMICORT) 0.5 MG/2ML nebulizer solution Take 2 mLs (0.5 mg total) by nebulization 2 (two) times daily.  . calcium carbonate (OS-CAL - DOSED IN MG OF ELEMENTAL CALCIUM) 1250 (500 Ca) MG tablet Take 1 tablet by mouth 2 (two) times daily with a meal.  . cholecalciferol (VITAMIN D) 1000 units tablet Take 1,000 Units by mouth daily.  . clopidogrel (PLAVIX) 75 MG tablet TAKE 1 TABLET BY MOUTH ONCE DAILY  . denosumab (PROLIA) 60 MG/ML SOLN injection Inject 60 mg into the skin every 6 (six) months. Administer in upper arm, thigh, or abdomen  . fluticasone (FLONASE) 50 MCG/ACT nasal spray INSTILL 2 SPRAYS INTO BOTH NOSTRILS ONCEDAILY AS DIRECTED  . furosemide (LASIX) 20 MG tablet Take 1 tablet (20 mg total) by mouth daily.  Marland Kitchen ipratropium-albuterol (DUONEB) 0.5-2.5 (3) MG/3ML SOLN INHALE 1 VIAL VIA NEBULIZER FOUR TIMES ADAY OR EVERY 4 HOURS AS NEEDED AS DIRECTED.  Marland Kitchen isosorbide mononitrate (IMDUR) 30 MG 24 hr tablet TAKE 1 TABLET BY MOUTH ONCE DAILY  . loratadine (CLARITIN) 10 MG tablet Take 10 mg by mouth daily as needed for allergies or rhinitis.   . LOTEMAX 0.5 % ophthalmic suspension Place 1 drop into both eyes daily.   . meclizine (ANTIVERT) 25 MG tablet Take 25 mg by mouth as needed for dizziness.  . nitroGLYCERIN (NITROSTAT) 0.4 MG SL tablet Place 1 tablet (0.4 mg total) under the tongue every 5 (five) minutes as needed for chest pain.  . pantoprazole (PROTONIX) 40 MG tablet Take 1 tablet (40 mg total) by mouth 2 (two) times daily.  . prednisoLONE acetate (PRED FORTE) 1 % ophthalmic suspension Place 1 drop into both eyes daily.   . simvastatin (ZOCOR) 40 MG tablet TAKE 1 TABLET BY MOUTH AT BEDTIME  . Spacer/Aero-Holding Chambers (AEROCHAMBER MV) inhaler Use as instructed  . spironolactone  (ALDACTONE) 25 MG tablet Take 0.5 tablets (12.5 mg total) by mouth daily.   Facility-Administered Encounter Medications as of 06/14/2018  Medication  . ipratropium-albuterol (DUONEB) 0.5-2.5 (3) MG/3ML nebulizer solution 3 mL  . [COMPLETED] methylPREDNISolone acetate (DEPO-MEDROL) injection 80 mg     Review of Systems  Review of Systems  Constitutional: Negative.  Negative for appetite change, chills and fever.  HENT: Positive for congestion, postnasal drip, sinus pressure and sinus pain.   Respiratory: Positive for cough and shortness of breath. Negative for wheezing.   Cardiovascular: Negative.  Negative for chest pain, palpitations and leg swelling.  Gastrointestinal: Negative.   Allergic/Immunologic: Negative.   Neurological: Negative.   Psychiatric/Behavioral: Negative.        Physical Exam  BP 114/66 (BP Location: Left Arm, Patient Position: Sitting, Cuff Size: Normal)   Pulse (!) 54   Temp 98.2 F (36.8 C)   Ht 5\' 6"  (1.676 m)   Wt 130 lb 9.6 oz (59.2 kg)   SpO2 94% Comment: On 2L of O2  BMI 21.08  kg/m   Wt Readings from Last 5 Encounters:  06/14/18 130 lb 9.6 oz (59.2 kg)  05/20/18 125 lb (56.7 kg)  04/28/18 129 lb 12.8 oz (58.9 kg)  04/28/18 130 lb (59 kg)  04/22/18 130 lb 3.2 oz (59.1 kg)     Physical Exam  Constitutional: She is oriented to person, place, and time. She appears well-developed and well-nourished. No distress.  Cardiovascular: Normal rate and regular rhythm.  Pulmonary/Chest: Effort normal and breath sounds normal. No respiratory distress. She has no wheezes.  Musculoskeletal: She exhibits no edema.  Neurological: She is alert and oriented to person, place, and time.  Psychiatric: She has a normal mood and affect.  Nursing note and vitals reviewed.      Assessment & Plan:   Pulmonary nodules   Remember to repeat your CT scan of the chest in December to follow your pulmonary nodules. Already ordered by Dr. Lamonte Sakai.  Flu shot and  pneumonia shots are up-to-date. Follow with Dr. Lamonte Sakai in January to review your CT scan or sooner if you have any problems.    Chronic respiratory failure with hypoxia (HCC) Continue oxygen at 2 L  COPD (chronic obstructive pulmonary disease) (HCC) Patient Instructions  Will order Augmentin  DepoMedrol in office today Samples of mucinex  Samples of delsym given in office today Remember to repeat your CT scan of the chest in December to follow your pulmonary nodules. Already ordered by Dr. Lamonte Sakai.  Flu shot and pneumonia shots are up-to-date. Follow with Dr. Lamonte Sakai in January to review your CT scan or sooner if you have any problems.    Acute non-recurrent maxillary sinusitis Patient Instructions  Will order Augmentin  DepoMedrol in office today Samples of mucinex  Samples of delsym given in office today  General instructions: Hydrate  Drink enough water to keep your pee (urine) clear or pale yellow. Rest  Rest as much as possible.  Sleep with your head raised (elevated).  Make sure to get enough sleep each night. General instructions  Put a warm, moist washcloth on your face 3-4 times a day or as told by your doctor. This will help with discomfort.  Wash your hands often with soap and water. If there is no soap and water, use hand sanitizer.  Do not smoke. Avoid being around people who are smoking (secondhand smoke). Call the office if:  You have a fever.  Your symptoms get worse.  Your symptoms do not get better within 10 days.  Remember to repeat your CT scan of the chest in December to follow your pulmonary nodules. Already ordered by Dr. Lamonte Sakai.  Flu shot and pneumonia shots are up-to-date. Follow with Dr. Lamonte Sakai in January to review your CT scan or sooner if you have any problems.       Fenton Foy, NP 06/14/2018

## 2018-06-15 ENCOUNTER — Telehealth: Payer: Self-pay

## 2018-06-15 ENCOUNTER — Telehealth: Payer: Self-pay | Admitting: Nurse Practitioner

## 2018-06-15 DIAGNOSIS — M810 Age-related osteoporosis without current pathological fracture: Secondary | ICD-10-CM

## 2018-06-15 MED ORDER — AMOXICILLIN-POT CLAVULANATE 875-125 MG PO TABS: 1.0000 | ORAL_TABLET | Freq: Two times a day (BID) | ORAL | 0 refills | Status: DC

## 2018-06-15 MED ORDER — DENOSUMAB 60 MG/ML ~~LOC~~ SOSY: 60.0000 mg | PREFILLED_SYRINGE | SUBCUTANEOUS | 1 refills | Status: DC

## 2018-06-15 NOTE — Telephone Encounter (Signed)
Spoke with patient, requesting Augmentin, states TN was going to send in during office visit. Pharmacy confirmed with patient.   TN please advise of Augmentin order so we can send into pharmacy.

## 2018-06-15 NOTE — Telephone Encounter (Signed)
Called and spoke to patient, made patient aware script has been sent. Patient voiced understanding. Nothing further is needed at this time.

## 2018-06-15 NOTE — Addendum Note (Signed)
Addended by: Philis Nettle on: 06/15/2018 09:08 AM   Modules accepted: Orders

## 2018-06-15 NOTE — Telephone Encounter (Signed)
It has been sent to the pharmacy. Thanks.

## 2018-06-15 NOTE — Telephone Encounter (Signed)
Copied from [place] [id number]. Topic: General - Other >> Jun 14, 2018  4:17 PM Carolyn Stare wrote:  Pt is asking if she is due for a prolia in Dec 2019 she said she think she had one 12/2017  .Would like a call back

## 2018-06-16 NOTE — Telephone Encounter (Signed)
Spoke with [place] advised that Juliann Pulse , LPN will work on getting Prolia authorized through insurance and then call patient with benefits and schedule patient at office where we will give her injection provided by [place] . Tiffany will notify patient .

## 2018-06-16 NOTE — Telephone Encounter (Signed)
Tiffany from [facility] calling with patient also on the line, stating that the patient is confused as to why she needs to pick up prolia from pharmacy instead of office as it is usually filed under part B. Patient states she cannot pay upfront at pharmacy (would prefer to be billed by office if possible).  Jonelle Sidle is requesting a call back to discuss further. Please advise.    Tiffany HTA # [phone]

## 2018-06-20 ENCOUNTER — Other Ambulatory Visit: Payer: Self-pay | Admitting: *Deleted

## 2018-06-20 NOTE — Patient Outreach (Signed)
[name]) Care Management  06/20/2018  [name] 02/26/1941 [id number]   Telephone follow up call   PMHx COPD, CHF, CKD, CVA without residual deficits    Successful outreach call to patient.  Patient reports that she is feeling much better. Patient discussed recent problem with cough congestion, she scheduled office visit with pulmonary, she discussed getting steroid injection and antibiotic augmentin . Patient reports no increase in coughing , patient able to identifies  being in the green zone , when asked regarding symptoms.  Patient reports taking nebulizer treatments as prescribed, denies recent need for rescue inhaler. Patient continues to wear her oxygen at 2 liters around the clock, she reports tolerating activity around home and goes out shopping on pretty days, she avoids, going out on rainy, windy and extreme cold days. Reinforced yellow zone symptoms and action plan   Heart failure.  Patient discussed weight today is at her usual range, 125.8 , she denies sudden weight gains, , no swelling. Patient reports that she continues to adhere to low salt diet and states that this has helped her with no having swelling or fluid weight. Patient states that she is limiting salt to 2000 mg a day.  Reinforced action plan for CHF worsening symptoms .   Plan  Will plan return call in the next month and if no new care coordination need will transition to health coach for continued disease management, dicussed with patient and she is agreement .   [facility] CM Care Plan Problem Two     Most Recent Value  Care Plan Problem Two  CHF-self care  Role Documenting the Problem Two  Care Management Coordinator  Care Plan for Problem Two  Active  Interventions for Problem Two Long Term Goal   Discussed with patient current zone ,and worsening symptoms for yellow zone and action to take, Reviewed recent weights ,   [facility] Long Term Goal  Over the next 60 days, patient will weigh and  record daily as evidenced by patient reporting and visualization of documented weights during home visit [goal extended to complete 90 day CHF post dc plan ]  [facility] Long Term Goal Start Date  05/10/18  [facility] CM Short Term Goal #1   Over the next 30 days, patient will be able to verbalize foods high in sodium that should be consumed in moderation on a low sodium diet  [facility] CM Short Term Goal #1 Start Date  05/10/18  [facility] CM Short Term Goal #1 Met Date   06/20/18  Interventions for Short Term Goal #2   review with teachback  [facility] CM Short Term Goal #2   Over the next 3 weeks, patient will utilize the 24 hours nurse advice line as need as evidenced by patient reporting  [facility] CM Short Term Goal #2 Start Date  05/20/18  [facility] CM Short Term Goal #2 Met Date  06/20/18      Joylene Draft, RN, [place] Management Coordinator  [phone]- Mobile [phone]- [place] Office

## 2018-06-21 DIAGNOSIS — R269 Unspecified abnormalities of gait and mobility: Secondary | ICD-10-CM | POA: Diagnosis not present

## 2018-06-21 DIAGNOSIS — R911 Solitary pulmonary nodule: Secondary | ICD-10-CM | POA: Diagnosis not present

## 2018-06-21 DIAGNOSIS — E785 Hyperlipidemia, unspecified: Secondary | ICD-10-CM | POA: Diagnosis not present

## 2018-06-21 DIAGNOSIS — J449 Chronic obstructive pulmonary disease, unspecified: Secondary | ICD-10-CM | POA: Diagnosis not present

## 2018-06-22 NOTE — Telephone Encounter (Signed)
Insurance verification for Prolia filed on [employer] Portal. 

## 2018-06-30 ENCOUNTER — Telehealth: Payer: Self-pay | Admitting: *Deleted

## 2018-06-30 DIAGNOSIS — M810 Age-related osteoporosis without current pathological fracture: Secondary | ICD-10-CM

## 2018-06-30 NOTE — Telephone Encounter (Signed)
Information has been submitted to pts insurance for verification of benefits. Awaiting response for coverage  

## 2018-07-06 NOTE — Telephone Encounter (Signed)
Pt called [facility]. Patient calling to find out when she can schedule her Prolia injection - states she is due this month.

## 2018-07-06 NOTE — Telephone Encounter (Signed)
Patient calling to find out when she can schedule her Prolia injection - states she is due this month.

## 2018-07-06 NOTE — Telephone Encounter (Signed)
Please advise 

## 2018-07-07 NOTE — Telephone Encounter (Signed)
She can get prolia here  Juliann Pulse is there anything I need to do? She has gotten prolia before, just never at our office

## 2018-07-07 NOTE — Addendum Note (Signed)
Addended by: Neta Ehlers on: 07/07/2018 08:58 AM   Modules accepted: Orders

## 2018-07-07 NOTE — Telephone Encounter (Addendum)
I will have to submit a new verification in your name as the ordering provider. Bone density -4.7 it should be easy to approve. I start the process.

## 2018-07-07 NOTE — Telephone Encounter (Signed)
Spoke to pt who states she is no longer a [facility] pt and will receive her prolia injection at the new office.

## 2018-07-07 NOTE — Telephone Encounter (Signed)
Thanks

## 2018-07-08 NOTE — Telephone Encounter (Signed)
Insurance verification for Prolia filed on [employer] Portal. 

## 2018-07-11 ENCOUNTER — Other Ambulatory Visit: Payer: Self-pay | Admitting: *Deleted

## 2018-07-11 NOTE — Patient Outreach (Signed)
[facility]) Care Management  07/11/2018  [name] [birth year] [id number]  Telephone follow up call   Patient admission 8/23-8/26/19  Dx: Acute on chronic CHF, Pulmonary edema   PMHx: includes but not limited to  COPD o2 dependant , CHF, CKD, CVA without residual deficits,Atrial fib,    Successful outreach call to patient , she reports that she is feeling good.   Patient discussed that she was able to go out to church on yesterday and drove herself.  Heart Failure  Patient reports being the Green zone on today, she reports continuing to wear her oxygen at 2 liters. Patient reports that she continues to adhere to low salt diet sticking to 2000 mg per day.  Patient denies increase in swelling or increased shortness or sudden weight gain , today's weight is 125.   Hx of COPD  Patient reports that she is at her baseline as far as COPD and breathing .  Patient has pulsed oxygen at 2 liters that she reports using at all times.  Patient reports having all medications and using as prescribed. Review of COPD yellow zone and when to notify MD for worsening of symptoms patient is aware and reports notifying MD if she has concerns and arranges office visit.  Patient is up to date on Flu and pneumonia vaccines.  Patient discussed having follow up chest  CT on next week and appointment with Dr.Byrum in January.   Patient reports that her niece lives near by and is able to assist her as needed, she discussed that her niece helps with filling her pill organizer.   Patient denies any new concerns at this time, discussed again telephonic health coach program for continued education and support of chronic conditions and patient is agreeable.   Plan  Will close case to complex case management  will place referral to disease management for continued education and support of chronic conditions, patient with recent admission with CHF, and has history of COPD.  Will send PCP ,  discipline closure letter.    Joylene Draft, RN, [place] Management Coordinator  [phone]- Mobile [phone]- Toll Free Main Office

## 2018-07-12 ENCOUNTER — Telehealth: Payer: Self-pay | Admitting: *Deleted

## 2018-07-12 NOTE — Telephone Encounter (Signed)
Patient scheduled for Prolia 07/14/18

## 2018-07-12 NOTE — Telephone Encounter (Signed)
Copied from [place] [phone] [id number]. Topic: General - Other >> Jul 12, 2018 10:01 AM Yvette Rack wrote: Reason for CRM: Pt stated she was last told that the hold up was due to a response from the insurance but she spoke with her insurance and she was told that no approval was needed for the prolia injection. Pt requests a call back. Cb# [id number]

## 2018-07-13 DIAGNOSIS — H26492 Other secondary cataract, left eye: Secondary | ICD-10-CM | POA: Diagnosis not present

## 2018-07-13 DIAGNOSIS — H43813 Vitreous degeneration, bilateral: Secondary | ICD-10-CM | POA: Diagnosis not present

## 2018-07-13 DIAGNOSIS — H40003 Preglaucoma, unspecified, bilateral: Secondary | ICD-10-CM | POA: Diagnosis not present

## 2018-07-13 DIAGNOSIS — Z947 Corneal transplant status: Secondary | ICD-10-CM | POA: Diagnosis not present

## 2018-07-14 ENCOUNTER — Encounter: Payer: Self-pay | Admitting: *Deleted

## 2018-07-14 ENCOUNTER — Ambulatory Visit (INDEPENDENT_AMBULATORY_CARE_PROVIDER_SITE_OTHER): Payer: PPO

## 2018-07-14 DIAGNOSIS — M810 Age-related osteoporosis without current pathological fracture: Secondary | ICD-10-CM | POA: Diagnosis not present

## 2018-07-14 MED ORDER — DENOSUMAB 60 MG/ML ~~LOC~~ SOSY
60.0000 mg | PREFILLED_SYRINGE | Freq: Once | SUBCUTANEOUS | Status: AC
  Administered 2018-07-14: 60 mg via SUBCUTANEOUS

## 2018-07-14 NOTE — Progress Notes (Signed)
Pt was seen today for NV for prolia injection given SQ in the RA. Pt tolerated well.

## 2018-07-18 ENCOUNTER — Ambulatory Visit
Admission: RE | Admit: 2018-07-18 | Discharge: 2018-07-18 | Disposition: A | Discharge status: Home / Self Care | Payer: PPO | Source: Ambulatory Visit | Attending: Emergency Medicine | Admitting: Emergency Medicine

## 2018-07-18 ENCOUNTER — Other Ambulatory Visit: Payer: PPO

## 2018-07-18 DIAGNOSIS — J439 Emphysema, unspecified: Secondary | ICD-10-CM | POA: Diagnosis not present

## 2018-07-18 DIAGNOSIS — R933 Abnormal findings on diagnostic imaging of other parts of digestive tract: Secondary | ICD-10-CM | POA: Diagnosis not present

## 2018-07-18 DIAGNOSIS — I700 Atherosclerosis of aorta: Secondary | ICD-10-CM | POA: Insufficient documentation

## 2018-07-18 DIAGNOSIS — R918 Other nonspecific abnormal finding of lung field: Secondary | ICD-10-CM | POA: Insufficient documentation

## 2018-07-21 DIAGNOSIS — R269 Unspecified abnormalities of gait and mobility: Secondary | ICD-10-CM | POA: Diagnosis not present

## 2018-07-21 DIAGNOSIS — R911 Solitary pulmonary nodule: Secondary | ICD-10-CM | POA: Diagnosis not present

## 2018-07-21 DIAGNOSIS — J449 Chronic obstructive pulmonary disease, unspecified: Secondary | ICD-10-CM | POA: Diagnosis not present

## 2018-07-21 DIAGNOSIS — E785 Hyperlipidemia, unspecified: Secondary | ICD-10-CM | POA: Diagnosis not present

## 2018-08-01 ENCOUNTER — Encounter: Payer: Self-pay | Admitting: Emergency Medicine

## 2018-08-01 ENCOUNTER — Ambulatory Visit: Payer: PPO | Admitting: Emergency Medicine

## 2018-08-01 DIAGNOSIS — R918 Other nonspecific abnormal finding of lung field: Secondary | ICD-10-CM | POA: Diagnosis not present

## 2018-08-01 DIAGNOSIS — J449 Chronic obstructive pulmonary disease, unspecified: Secondary | ICD-10-CM

## 2018-08-01 NOTE — Assessment & Plan Note (Signed)
Please continue your DuoNeb 4 times a day as you have been taking it Please continue budesonide nebulizers 2 times a day as you have been taking them Continue your oxygen at all times, 2 L/min

## 2018-08-01 NOTE — Assessment & Plan Note (Signed)
We discussed her CT scan of the chest from 12/23.  There has been an interval increase in the size of her rounded lesion on the left, the rounded region in the right lower lobe is stable in size.  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  We talked about this in detail today.  She understands that there may be benefit to acting now, preemptively to prevent spread or downstream complications from presumed lung cancer.  All the same that she is not sure that she can tolerate her wants to receive radiation, chemotherapy, any therapy at all.  I think that she would be open to discussing this if she were symptomatic-she acknowledges this.  Her philosophy is currently that she would not want diagnostic biopsy or discussion about treatment unless her symptoms dictated this.  I support her decision and I believe she is making it with all the facts.  I will plan to repeat her CT scan of the chest in 6 months, sooner if symptoms do evolve.  We will continue to consider biopsy, treatment going forward.

## 2018-08-01 NOTE — Progress Notes (Signed)
Subjective:    Patient ID: [name], female    DOB: February 27, 1941, 78 y.o.   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  She also has a history of chronic cough GERD, PND, exertional hypoxemia.  We have deferred any biopsy or nodules unless she becomes symptomatic, plans to repeat her CT in November.  She was admitted since I saw her in August with dizziness, was found to have pulmonary edema on chest x-ray.  She was diuresed, continued on both Aldactone and furosemide. She is using pulmicort and Duonebs. She is now on pulsed flow O2 at 2L/min, has a small tank that lasts about 6 hours. She has some intermittent R pleuritic pain. She has had the flu shot. PNA shots UTD.   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  We have been following slow-growing pulmonary nodules (hypermetabolic on PET 0/02/6760) conservatively.  She had a repeat CT scan 07/18/2018 that I reviewed.  The subpleural left lower lobe nodule has increased in size, is now 2.1 x 1.4 cm, posterior right lower lobe pleural thickening present with a nodular density that is unchanged in size, right upper lobe groundglass 6 mm nodule unchanged. She was treated for a suspected sinusitis w Augmentin in November - now improved. She is on 2L/min.     Review of Systems     As per history of present illness    Objective:   Physical Exam Vitals:   08/01/18 1058  BP: 120/72  Pulse: 66  SpO2: 98%  Weight: 133 lb (60.3 kg)  Height: 5\' 6"  (1.676 m)    Gen: Anxious, elderly woman, no resp distress  ENT: No lesions,  mouth clear,  oropharynx clear, no postnasal drip, weak hoarse voice  Neck: No  JVD, no stridor  Lungs: tachypneic, small breaths but no wheezing. No wheeze on forced exp  Cardiovascular: RRR, heart sounds normal, no murmur or gallops, no peripheral edema.   Musculoskeletal: No deformities, no cyanosis or clubbing  Neuro: alert, non focal  Skin: Warm, no lesions or rashes      Assessment & Plan:  Pulmonary nodules We discussed her CT scan of the chest from 12/23.  There has been an interval increase in the size of her rounded lesion on the left, the rounded region in the right lower lobe is stable in size.  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  We talked about this in detail today.  She understands that there may be benefit to acting now, preemptively to prevent spread or downstream complications from presumed lung cancer.  All the same that she is not sure that she can tolerate her wants to receive radiation, chemotherapy, any therapy at all.  I think that she would be open to discussing this if she were symptomatic-she acknowledges this.  Her philosophy is currently that she would not want diagnostic biopsy or discussion about treatment unless her symptoms dictated this.  I support her decision and I believe she is making it with all the facts.  I will plan to repeat her CT scan of the chest in 6 months, sooner if symptoms do evolve.  We will continue to consider  biopsy, treatment going forward.  COPD (chronic obstructive pulmonary disease) ([place]) Please continue your DuoNeb 4 times a day as you have been taking it Please continue budesonide nebulizers 2 times a day as you have been taking them Continue your oxygen at all times, 2 L/min  Baltazar Apo, MD, PhD 08/01/2018, 11:21 AM [facility] [phone] or if no answer [phone]

## 2018-08-01 NOTE — Patient Instructions (Signed)
Please continue your DuoNeb 4 times a day as you have been taking it Please continue budesonide nebulizers 2 times a day as you have been taking them Continue your oxygen at all times, 2 L/min We will plan to repeat your CT scan of the chest in 6 months.  If you develop new symptoms of breathing difficulty, chest discomfort, cough or any alarming respiratory symptoms we may decide to repeat your CT sooner. Follow with Dr Lamonte Sakai in 6 months or sooner if you have any problems

## 2018-08-05 ENCOUNTER — Other Ambulatory Visit: Payer: Self-pay | Admitting: *Deleted

## 2018-08-05 NOTE — Patient Outreach (Signed)
[facility]) Care Management  08/05/2018  Kataleena Holsapple [place] 05/29/1941 [id number]   RN Health Coach Initial Assessment  Referral Date:  07/11/2018 Referral Source:  Transfer from [place] Reason for Referral:  Continued Disease Management Education Insurance:  [employer]   Outreach Attempt:  Outreach attempt #1 to patient for introduction and initial telephone assessment. No answer. RN Health Coach left HIPAA compliant voicemail message along with contact information.  Plan:  RN Health Coach will make another outreach attempt within the month of January.   [place] [phone] [email or web address]

## 2018-08-12 ENCOUNTER — Other Ambulatory Visit (HOSPITAL_COMMUNITY): Payer: Self-pay | Admitting: Cardiovascular Disease

## 2018-08-12 NOTE — Telephone Encounter (Signed)
Pt's medication was sent to pt's pharmacy as requested. Confirmation received.  °

## 2018-08-15 ENCOUNTER — Other Ambulatory Visit: Payer: Self-pay | Admitting: Medical

## 2018-08-15 NOTE — Telephone Encounter (Signed)
Valetta Fuller, can you help with this one?  I don't see where she's ever had these instructions on the 20mg  dose, just 40mg .

## 2018-08-21 DIAGNOSIS — R269 Unspecified abnormalities of gait and mobility: Secondary | ICD-10-CM | POA: Diagnosis not present

## 2018-08-21 DIAGNOSIS — E785 Hyperlipidemia, unspecified: Secondary | ICD-10-CM | POA: Diagnosis not present

## 2018-08-21 DIAGNOSIS — R911 Solitary pulmonary nodule: Secondary | ICD-10-CM | POA: Diagnosis not present

## 2018-08-21 DIAGNOSIS — J449 Chronic obstructive pulmonary disease, unspecified: Secondary | ICD-10-CM | POA: Diagnosis not present

## 2018-08-22 ENCOUNTER — Ambulatory Visit: Payer: PPO | Admitting: Cardiovascular Disease

## 2018-08-22 ENCOUNTER — Encounter: Payer: Self-pay | Admitting: Cardiovascular Disease

## 2018-08-22 VITALS — BP 120/70 | HR 69 | Ht 66.0 in | Wt 131.0 lb

## 2018-08-22 DIAGNOSIS — I5022 Chronic systolic (congestive) heart failure: Secondary | ICD-10-CM | POA: Diagnosis not present

## 2018-08-22 DIAGNOSIS — I2510 Atherosclerotic heart disease of native coronary artery without angina pectoris: Secondary | ICD-10-CM | POA: Diagnosis not present

## 2018-08-22 LAB — BASIC METABOLIC PANEL
BUN/Creatinine Ratio: 13 (ref 12–28)
BUN: 11 mg/dL (ref 8–27)
CO2: 25 mmol/L (ref 20–29)
CREATININE: 0.85 mg/dL (ref 0.57–1.00)
Calcium: 9.3 mg/dL (ref 8.7–10.3)
Chloride: 98 mmol/L (ref 96–106)
GFR calc Af Amer: 76 mL/min/{1.73_m2} (ref >59)
GFR, EST NON AFRICAN AMERICAN: 66 mL/min/{1.73_m2} (ref >59)
Glucose: 74 mg/dL (ref 65–99)
Potassium: 5 mmol/L (ref 3.5–5.2)
Sodium: 137 mmol/L (ref 134–144)

## 2018-08-22 NOTE — Progress Notes (Signed)
Cardiology Office Note:    Date:  08/26/2018   ID:  [name], DOB [birth year], MRN [id number]  PCP:  Jodelle Green, FNP  Cardiologist:  Sherren Mocha, MD  Electrophysiologist:  Constance Haw, MD   Referring MD: Jodelle Green, FNP   Chief Complaint  Patient presents with  . Shortness of Breath    History of Present Illness:    [name] is a 78 y.o. female with a hx of coronary artery disease status post multiple myocardial infarctions and chronic systolic heart failure.  She has undergone stenting of the LAD and the circumflex in the past.  She has advanced lung disease and chronic shortness of breath.  The patient is on home O2.  She has recurrent lung cancer but does not feel strong enough to pursue any further treatments.  She is here with a caregiver today.  The patient continues to struggle with shortness of breath and fatigue.  She denies any recent chest pain.  She denies leg swelling, orthopnea, or PND.  Overall she has felt fairly well over the last month and states this is the best she has been in quite some time.  She is intolerant to a lot of medications.  She feels like she is on a good regimen at present.  Past Medical History:  Diagnosis Date  . Arthritis   . CAD (coronary artery disease) 03/2009   s/p MI  . Chronic combined systolic and diastolic heart failure ([place]) 06/17/2009   Qualifier: Diagnosis of  By: Burt Knack, MD, [place] Pert   . CKD (chronic kidney disease) stage 3, GFR 30-59 ml/min (HCC)   . Emphysema/COPD ([place])   . GERD (gastroesophageal reflux disease)   . HCAP (healthcare-associated pneumonia) 03/19/2016  . Helicobacter pylori gastritis 10/2007   treated  . History of CVA (cerebrovascular accident) without residual deficits 01/2012, 06/2012   R hemorrhagic MCA 01/2012 with remote lacunar infarct L putamen and IC, rpt 06/2012 acute multifocal R MCA infarct with remote hemorrhagic strokes affecting L basal ganglia and periventricular white  matter, full recovery  . HLD (hyperlipidemia)   . HTN (hypertension)   . Ischemic cardiomyopathy 02/27/2014  . Lung cancer ([place]) dx'd 07/2006   Lung CA, s/p resection, followed by Dr Earlie Server  . Macular degeneration   . Myocardial infarction [facility]) 03/2009   Acute myocardial infarction 2010 - treated with BMS of LCx. LVEF 50% with subsequent CHF  . NSTEMI (non-ST elevated myocardial infarction) ([place])   . Osteoporosis 09/2013   T -3.6 forearm 09/2013, T -4.5 forearm 04/2015  . Pre-diabetes   . Syncope 02/24/2018    Past Surgical History:  Procedure Laterality Date  . BREAST BIOPSY Left   . CARDIAC CATHETERIZATION N/A 02/24/2016   Procedure: Left Heart Cath and Coronary Angiography;  Surgeon: Belva Crome, MD;  Location: [place] CV LAB;  Service: Cardiovascular;  Laterality: N/A;  . CARDIAC CATHETERIZATION N/A 07/09/2016   Procedure: Left Heart Cath and Coronary Angiography;  Surgeon: Peter M Martinique, MD;  Location: [place] CV LAB;  Service: Cardiovascular;  Laterality: N/A;  . CARDIAC CATHETERIZATION N/A 07/09/2016   Procedure: Coronary Stent Intervention;  Surgeon: Peter M Martinique, MD;  Location: [place] CV LAB;  Service: Cardiovascular;  Laterality: N/A;  . CATARACT EXTRACTION Bilateral   . CORONARY STENT INTERVENTION N/A 07/06/2017   Procedure: CORONARY STENT INTERVENTION;  Surgeon: Lorretta Harp, MD;  Location: [place] CV LAB;  Service: Cardiovascular;  Laterality: N/A;  .  CORONARY STENT PLACEMENT  06/2016   DES to mid LAD  . CORONARY/GRAFT ACUTE MI REVASCULARIZATION N/A 07/06/2017   Procedure: Coronary/Graft Acute MI Revascularization;  Surgeon: Lorretta Harp, MD;  Location: [place] CV LAB;  Service: Cardiovascular;  Laterality: N/A;  . EYE SURGERY    . LEFT HEART CATH AND CORONARY ANGIOGRAPHY N/A 07/06/2017   Procedure: LEFT HEART CATH AND CORONARY ANGIOGRAPHY;  Surgeon: Lorretta Harp, MD;  Location: [place] [facility];  Service: Cardiovascular;   Laterality: N/A;  . LUNG REMOVAL, PARTIAL Right 2008  . PARTIAL HYSTERECTOMY  1986   irregular periods, ovaries remain    Current Medications: Current Meds  Medication Sig  . albuterol (PROAIR HFA) 108 (90 Base) MCG/ACT inhaler Inhale 2 puffs into the lungs every 4 (four) hours as needed for wheezing or shortness of breath.  Marland Kitchen albuterol (PROVENTIL) (2.5 MG/3ML) 0.083% nebulizer solution Take 3 mLs (2.5 mg total) by nebulization every 4 (four) hours as needed for wheezing or shortness of breath.  Marland Kitchen aspirin EC 81 MG tablet Take 1 tablet (81 mg total) by mouth daily.  . budesonide (PULMICORT) 0.5 MG/2ML nebulizer solution Take 2 mLs (0.5 mg total) by nebulization 2 (two) times daily.  . calcium carbonate (OS-CAL - DOSED IN MG OF ELEMENTAL CALCIUM) 1250 (500 Ca) MG tablet Take 1 tablet by mouth 2 (two) times daily with a meal.  . cholecalciferol (VITAMIN D) 1000 units tablet Take 1,000 Units by mouth daily.  . clopidogrel (PLAVIX) 75 MG tablet TAKE 1 TABLET BY MOUTH ONCE DAILY  . denosumab (PROLIA) 60 MG/ML SOLN injection Inject 60 mg into the skin every 6 (six) months. Administer in upper arm, thigh, or abdomen  . denosumab (PROLIA) 60 MG/ML SOSY injection Inject 60 mg into the skin every 6 (six) months.  . fluticasone (FLONASE) 50 MCG/ACT nasal spray INSTILL 2 SPRAYS INTO BOTH NOSTRILS ONCEDAILY AS DIRECTED  . furosemide (LASIX) 20 MG tablet TAKE 1 TABLET BY MOUTH ONCE DAILY. MAY TAKE AN ADDITIONAL 1/2 TABLET IF NEEDED FOR SWELLING  . ipratropium-albuterol (DUONEB) 0.5-2.5 (3) MG/3ML SOLN INHALE 1 VIAL VIA NEBULIZER FOUR TIMES ADAY OR EVERY 4 HOURS AS NEEDED AS DIRECTED.  Marland Kitchen isosorbide mononitrate (IMDUR) 30 MG 24 hr tablet TAKE 1 TABLET BY MOUTH ONCE DAILY  . loratadine (CLARITIN) 10 MG tablet Take 10 mg by mouth daily as needed for allergies or rhinitis.   Marland Kitchen nitroGLYCERIN (NITROSTAT) 0.4 MG SL tablet Place 1 tablet (0.4 mg total) under the tongue every 5 (five) minutes as needed for chest pain.   . OXYGEN Inhale 2 L into the lungs.  . pantoprazole (PROTONIX) 40 MG tablet Take 1 tablet (40 mg total) by mouth 2 (two) times daily.  . prednisoLONE acetate (PRED FORTE) 1 % ophthalmic suspension Place 1 drop into both eyes daily.   . simvastatin (ZOCOR) 40 MG tablet TAKE 1 TABLET BY MOUTH AT BEDTIME  . Spacer/Aero-Holding Chambers (AEROCHAMBER MV) inhaler Use as instructed  . spironolactone (ALDACTONE) 25 MG tablet TAKE 1/2 TABLET (12.MG TOTAL) BY MOUTH DAILY   Current Facility-Administered Medications for the 08/22/18 encounter (Office Visit) with Sherren Mocha, MD  Medication  . ipratropium-albuterol (DUONEB) 0.5-2.5 (3) MG/3ML nebulizer solution 3 mL     Allergies:   Actonel [risedronate sodium]; Amlodipine; Brilinta [ticagrelor]; Codeine; Fosamax [alendronate sodium]; Lisinopril; Pravastatin; and Prednisone   Social History   Socioeconomic History  . Marital status: Widowed    Spouse name: Not on file  . Number of children: 1  .  Years of education: 53  . Highest education level: High school graduate  Occupational History  . Occupation: retired    Comment: insurance  Social Needs  . Financial resource strain: Not hard at all  . Food insecurity:    Worry: Never true    Inability: Never true  . Transportation needs:    Medical: No    Non-medical: No  Tobacco Use  . Smoking status: Former Smoker    Packs/day: 0.50    Years: 42.00    Pack years: 21.00    Types: Cigarettes    Last attempt to quit: 07/27/2002    Years since quitting: 16.0  . Smokeless tobacco: Never Used  Substance and Sexual Activity  . Alcohol use: No    Alcohol/week: 0.0 standard drinks  . Drug use: No  . Sexual activity: Never  Lifestyle  . Physical activity:    Days per week: 0 days    Minutes per session: 0 min  . Stress: Not at all  Relationships  . Social connections:    Talks on phone: More than three times a week    Gets together: Twice a week    Attends religious service: 1 to 4  times per year    Active member of club or organization: No    Attends meetings of clubs or organizations: Never    Relationship status: Widowed  Other Topics Concern  . Not on file  Social History Narrative   The patient is a widow and has one son and one dog.     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  She does not drink alcohol. 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   Ed: HS   Activity: no regular exercise   Diet: some water, some fruits/vegetables    Lives in a one story home.      Family History: The patient's family history includes Breast cancer in her sister; CAD (age of onset: 52) in her father and mother; Diabetes in her sister; Heart attack in her mother; Hypertension in her mother; Stroke in her father and mother.  ROS:   Please see the history of present illness.    Positive for weakness and generalized fatigue.  All other systems reviewed and are negative.  EKGs/Labs/Other Studies Reviewed:    The following studies were reviewed today: Echo 03-18-2018: Study Conclusions  - Left ventricle: The cavity size was mildly dilated. Systolic   function was moderately to severely reduced. The estimated   ejection fraction was in the range of 30% to 35%. Akinesis and   scarring of the entireinferolateral myocardium; in the   distribution of the left circumflex coronary artery. Doppler   parameters are consistent with abnormal left ventricular   relaxation (grade 1 diastolic dysfunction). - Mitral valve: Calcified annulus. There was mild regurgitation. - Left atrium: The atrium was mildly dilated.  Impressions:  - No evidence of left ventricular thrombus is seen, but the apex   was suboptimally visualized. Recommend repeat limited Definity   study if there is suspicion for cardioembolic stroke.  EKG:  EKG is not ordered today.    Recent Labs: 02/25/2018: TSH 1.918 03/18/2018: ALT 22; B Natriuretic  Peptide 317.9 03/20/2018: Hemoglobin 12.0; Magnesium 1.9; Platelets 208 08/22/2018: BUN 11; Creatinine, Ser 0.85; Potassium 5.0; Sodium 137  Recent Lipid Panel    Component Value Date/Time   CHOL 155 11/26/2017 1204  TRIG 63.0 11/26/2017 1204   HDL 85.30 11/26/2017 1204   CHOLHDL 2 11/26/2017 1204   VLDL 12.6 11/26/2017 1204   LDLCALC 57 11/26/2017 1204   LDLDIRECT 50.0 11/25/2016 1131    Physical Exam:    VS:  BP 120/70   Pulse 69   Ht 5\' 6"  (1.676 m)   Wt 131 lb (59.4 kg)   SpO2 97%   BMI 21.14 kg/m     Wt Readings from Last 3 Encounters:  08/22/18 131 lb (59.4 kg)  08/01/18 133 lb (60.3 kg)  06/14/18 130 lb 9.6 oz (59.2 kg)     GEN: Thin, elderly woman in no acute distress.  On O2 HEENT: Normal NECK: No JVD; No carotid bruits LYMPHATICS: No lymphadenopathy CARDIAC: RRR, no murmurs, rubs, gallops RESPIRATORY: Poor air movement bilaterally ABDOMEN: Soft, non-tender, non-distended MUSCULOSKELETAL:  No edema; No deformity  SKIN: Warm and dry NEUROLOGIC:  Alert and oriented x 3 PSYCHIATRIC:  Normal affect   ASSESSMENT:    1. Chronic systolic heart failure ([place])   2. Coronary artery disease involving native coronary artery of native heart without angina pectoris    PLAN:    In order of problems listed above:  1. The patient appears clinically stable.  She is on a combination of Spironolactone and furosemide.  She is not able to tolerate beta-blocker, ACE, or ARB due to the symptomatic hypotension.  I think her regimen is reasonable and she should continue without change at this time. 2. The patient is having no anginal symptoms.  She remains on long-term dual antiplatelet therapy with aspirin and clopidogrel after suffering multiple ACS events in the past.  I would like her to continue as long as she is not having significant bleeding problems.   Medication Adjustments/Labs and Tests Ordered: Current medicines are reviewed at length with the patient today.   Concerns regarding medicines are outlined above.  Orders Placed This Encounter  Procedures  . Basic metabolic panel   No orders of the defined types were placed in this encounter.   Patient Instructions  Medication Instructions:  Your provider recommends that you continue on your current medications as directed. Please refer to the Current Medication list given to you today.    Labwork: TODAY: BMET  Testing/Procedures: None  Follow-Up: Your provider wants you to follow-up in: 6 months with Dr. Burt Knack or his assistant. You will receive a reminder letter in the mail two months in advance. If you don't receive a letter, please call our office to schedule the follow-up appointment.      Signed, Sherren Mocha, MD  08/26/2018 2:13 PM    [place]

## 2018-08-22 NOTE — Patient Instructions (Signed)
Medication Instructions:  Your provider recommends that you continue on your current medications as directed. Please refer to the Current Medication list given to you today.    Labwork: TODAY: BMET  Testing/Procedures: None  Follow-Up: Your provider wants you to follow-up in: 6 months with Dr. Burt Knack or his assistant. You will receive a reminder letter in the mail two months in advance. If you don't receive a letter, please call our office to schedule the follow-up appointment.

## 2018-08-23 ENCOUNTER — Other Ambulatory Visit: Payer: Self-pay | Admitting: *Deleted

## 2018-08-23 NOTE — Patient Outreach (Signed)
[facility]) Care Management  08/23/2018  Kleo Dungee Greb 11-26-1940 [id number]   RN Health Coach Initial Assessment  Referral Date:  07/11/2018 Referral Source:  Transfer from [place] Reason for Referral:  Continued Disease Management Education Insurance:  [employer]   Outreach Attempt:  Outreach attempt #2 to patient for introduction and initial telephone assessment. No answer. RN Health Coach left HIPAA compliant voicemail message along with contact information.  Plan:  RN Health Coach will make another outreach attempt within the month of February if no return call back from patient.  [place] [phone] [email or web address]

## 2018-08-26 ENCOUNTER — Encounter: Payer: Self-pay | Admitting: Cardiovascular Disease

## 2018-09-07 ENCOUNTER — Encounter: Payer: Self-pay | Admitting: *Deleted

## 2018-09-07 ENCOUNTER — Other Ambulatory Visit: Payer: Self-pay | Admitting: *Deleted

## 2018-09-07 NOTE — Patient Outreach (Signed)
[facility]) Care Management  [place]  09/07/2018   [name] Jul 05, 1941 [id number]   [place] Initial Assessment   Referral Date:  07/11/2018 Referral Source:  Transfer from [place] Reason for Referral:  Continued Disease Management Education Insurance:  [employer]age   Outreach Attempt:  Successful telephone outreach to patient for introduction and initial telephone assessment.  HIPAA verified with patient.  RN Health Coach introduced self and role.  Patient verbally agrees to Disease Management outreaches.  Patient completed initial telephone assessment.  Social:  Patient lives at home alone.  Reports being independent with ADLs and IADLs.  Ambulates independently and reports one fall in the last year related to a hypoxia syncopal episode.  Niece or sister in law transports patient to her medical appointments.  DME in the home include:  Upper and lower dentures, Rolator walker, nebulizer, built in shower seat, scale, blood pressure cuff, bedside commode, and oxygen concentrator and portable oxygen tanks.  Conditions:   Per chart review and discussion with patient, PMH include but not limited to:  Coronary artery disease, multiple myocardial infarctions, chronic systolic heart failure, coronary stenting, arthritis, chronic kidney disease, emphysema, COPD, GERD, cerebral vascular accident, hyperlipidemia, hypertension, ischemic cardiomyopathy, lung cancer with partial right lobectomy, macular degeneration, osteoporosis, and bilateral cataract extractions.  Patient states she wears her oxygen 2 l/m continuously.  Reports shortness of breath at times, but no change from the normal.  Denies any recent emergency room visits or hospitalizations.  Denies any swelling in lower extremities.  Weights herself daily.  This mornings weight is 129 pounds which is near her baseline.  Reports adherence to low salt diet.  Denies need for her rescue  inhaler in the past week.  Patient does report new lung nodule that pulmonologist is monitoring with serial CT scans.  States she is not interested in surgical treatment at this time.  Medications:  Patient states she takes about 11 medications a day.  Reports her niece manages her medications with weekly pill box fills.  Patient also reporting difficulties affording her Pulmicort nebulizer medication (about $60 per month).  Discussed [place] referral and patient verbally agrees.  Encounter Medications:  Outpatient Encounter Medications as of 09/07/2018  Medication Sig  . albuterol (PROAIR HFA) 108 (90 Base) MCG/ACT inhaler Inhale 2 puffs into the lungs every 4 (four) hours as needed for wheezing or shortness of breath.  Marland Kitchen albuterol (PROVENTIL) (2.5 MG/3ML) 0.083% nebulizer solution Take 3 mLs (2.5 mg total) by nebulization every 4 (four) hours as needed for wheezing or shortness of breath.  Marland Kitchen aspirin EC 81 MG tablet Take 1 tablet (81 mg total) by mouth daily.  . budesonide (PULMICORT) 0.5 MG/2ML nebulizer solution Take 2 mLs (0.5 mg total) by nebulization 2 (two) times daily.  . calcium carbonate (OS-CAL - DOSED IN MG OF ELEMENTAL CALCIUM) 1250 (500 Ca) MG tablet Take 1 tablet by mouth 2 (two) times daily with a meal.  . cholecalciferol (VITAMIN D) 1000 units tablet Take 1,000 Units by mouth daily.  . clopidogrel (PLAVIX) 75 MG tablet TAKE 1 TABLET BY MOUTH ONCE DAILY  . denosumab (PROLIA) 60 MG/ML SOLN injection Inject 60 mg into the skin every 6 (six) months. Administer in upper arm, thigh, or abdomen  . denosumab (PROLIA) 60 MG/ML SOSY injection Inject 60 mg into the skin every 6 (six) months.  . fluticasone (FLONASE) 50 MCG/ACT nasal spray INSTILL 2 SPRAYS INTO BOTH NOSTRILS ONCEDAILY AS DIRECTED  .  furosemide (LASIX) 20 MG tablet TAKE 1 TABLET BY MOUTH ONCE DAILY. MAY TAKE AN ADDITIONAL 1/2 TABLET IF NEEDED FOR SWELLING  . ipratropium-albuterol (DUONEB) 0.5-2.5 (3) MG/3ML SOLN INHALE 1  VIAL VIA NEBULIZER FOUR TIMES ADAY OR EVERY 4 HOURS AS NEEDED AS DIRECTED.  Marland Kitchen isosorbide mononitrate (IMDUR) 30 MG 24 hr tablet TAKE 1 TABLET BY MOUTH ONCE DAILY  . loratadine (CLARITIN) 10 MG tablet Take 10 mg by mouth daily as needed for allergies or rhinitis.   Marland Kitchen nitroGLYCERIN (NITROSTAT) 0.4 MG SL tablet Place 1 tablet (0.4 mg total) under the tongue every 5 (five) minutes as needed for chest pain.  . OXYGEN Inhale 2 L into the lungs.  . pantoprazole (PROTONIX) 40 MG tablet Take 1 tablet (40 mg total) by mouth 2 (two) times daily.  . prednisoLONE acetate (PRED FORTE) 1 % ophthalmic suspension Place 1 drop into both eyes daily.   . simvastatin (ZOCOR) 40 MG tablet TAKE 1 TABLET BY MOUTH AT BEDTIME  . Spacer/Aero-Holding Chambers (AEROCHAMBER MV) inhaler Use as instructed  . spironolactone (ALDACTONE) 25 MG tablet TAKE 1/2 TABLET (12.MG TOTAL) BY MOUTH DAILY   Facility-Administered Encounter Medications as of 09/07/2018  Medication  . ipratropium-albuterol (DUONEB) 0.5-2.5 (3) MG/3ML nebulizer solution 3 mL    Functional Status:  In your present state of health, do you have any difficulty performing the following activities: 09/07/2018 04/18/2018  Hearing? N N  Vision? N N  Difficulty concentrating or making decisions? N N  Walking or climbing stairs? Y Y  Comment difficulities climbing stairs yes related to shortess of breath from chf and copd  Dressing or bathing? N N  Doing errands, shopping? N N  Comment - but patient states "it wears me out"  Preparing Food and eating ? N N  Using the Toilet? N N  In the past six months, have you accidently leaked urine? N Y  Do you have problems with loss of bowel control? N N  Managing your Medications? Y Y  Comment - niece fixes pill box  Managing your Finances? N N  Housekeeping or managing your Housekeeping? N N  Some recent data might be hidden    Fall/Depression Screening: Fall Risk  09/07/2018 04/18/2018 11/26/2017  Falls in the past  year? 1 Yes No  Comment syncopal episode in December related to hypoxia - -  Number falls in past yr: 0 2 or more -  Injury with Fall? 0 No -  Risk Factor Category  - High Fall Risk -  Risk for fall due to : History of fall(s);Medication side effect;Impaired balance/gait History of fall(s) -  Follow up Falls evaluation completed;Falls prevention discussed;Education provided Falls evaluation completed;Falls prevention discussed;Education provided -   [facility] 2/9 Scores 09/07/2018 04/18/2018 11/26/2017 09/10/2017 11/25/2016 03/31/2016 03/09/2016  PHQ - 2 Score 0 0 0 0 0 0 0  PHQ- 9 Score - - 0 - - - 0   THN CM Care Plan Problem One     Most Recent Value  Care Plan Problem One  Knowledge deficiet related to self care management of COPD and CHF.  Role Documenting the Problem One  [place] for Problem One  Active  [facility] Long Term Goal   Patient will not have any unplanned hospitalizations in the next 90 days.  THN Long Term Goal Start Date  09/07/18  Interventions for Problem One Long Term Goal  Care plan and goals reviewed and discussed with patient, reviewed medications and indications and  encouraged medication compliance, encouraged to continue with daily weight monitoring, reviewed and discussed heart failure zones and action plan, encouraged patient to continue to review heart failure education and to post zones and action plan where visible,  reviewed and discussed COPD zones and action plan and encouraged patient to post where visible, confirmed patient is displaying Yellow Out of Facility DNR on the refridgerator, discussed low salt diet options  THN CM Short Term Goal #1   Patient will schedule primary care follow up appointment within the next 45 days.  THN CM Short Term Goal #1 Start Date  09/07/18  Interventions for Short Term Goal #1  Reviewed last office visit note with patient and discussed plan of follow up in 3 months (should have been December), encouraged patient to contact primary  care office to verify need to schedule follow up appointment, encouraged patient to schedule follow up appointment as soon as possible, discussed importance of medical follow up     Appointments:  Patient last attended appointment with primary care provider, Philis Nettle NP on 04/22/2018.  Per office note, patient needed 3 month follow up appointment, discussed this with patient and encouraged her to contact office to schedule follow up appointment.  Patient stated she would.  Attended appointments with Cardiology, Dr. Burt Knack on 08/22/2018 and Pulmonology, Dr. Lamonte Sakai on 08/01/2018.  Advanced Directives:   Reports having a Living Will and [place].  Does not wish to change Advance Directive at this time.  Patient also reporting she is a Do Not Resuscitate and has the [place] DNR hanging on the refrigerator.   Consent:  [facility] services reviewed and discussed.  Patient verbally agrees to Disease Management outreaches and [place] referral for possible medication assistance.  Plan: RN Health Coach will send primary MD barriers letter. RN Health Coach will route initial telephone assessment note to primary MD. [place] will send patient 2020 Calendar Booklet. RN Health Coach will send [place] referral for medication assistance.  [place] [phone] [email or web address]

## 2018-09-08 ENCOUNTER — Other Ambulatory Visit: Payer: Self-pay | Admitting: Pharmacist

## 2018-09-08 NOTE — Patient Outreach (Signed)
[place] [facility]) Care Management  [place]   09/08/2018  [place] Apr 10, 1941 [id number]  Reason for referral: Medication Assistance with Pulmicort Nebulizer Solution  Referral source: [place] Current insurance:[employer]  PMHx includes but not limited to: CAD, Atrial fibrillation, hx CVA, HFrEF (EF 30-35% 8/'19), HTN, HLD, lung cancer s/p resection, chronic kidney disease stage III, COPD, TIA, GERD, osteoarthritis, and macular degeneration.    Per review of [employer] formulary: budesonide nebulizer is Tier 4 but may be covered via Medicare Part B (20% co-insurance).    Outreach:  Successful telephone call with [name].  HIPAA identifiers verified.  Subjective:  Patient reports she picks up a 2 week supply with each refill and pays $27 co-pay each time.  She reports she uses the vials BID.  She is interested in learning if there is a way to get this any less expensive.    Objective: Lab Results  Component Value Date   CREATININE 0.85 08/22/2018   CREATININE 0.70 03/21/2018   CREATININE 0.81 03/20/2018    Lab Results  Component Value Date   HGBA1C 6.1 11/26/2017    Lipid Panel     Component Value Date/Time   CHOL 155 11/26/2017 1204   TRIG 63.0 11/26/2017 1204   HDL 85.30 11/26/2017 1204   CHOLHDL 2 11/26/2017 1204   VLDL 12.6 11/26/2017 1204   LDLCALC 57 11/26/2017 1204   LDLDIRECT 50.0 11/25/2016 1131    BP Readings from Last 3 Encounters:  08/22/18 120/70  08/01/18 120/72  06/14/18 114/66    Allergies  Allergen Reactions  . Actonel [Risedronate Sodium] Other (See Comments)    Headache  . Amlodipine Other (See Comments)    Pedal edema  . Brilinta [Ticagrelor] Other (See Comments)    Worsening dyspnea, malaise  . Codeine Rash  . Fosamax [Alendronate Sodium] Other (See Comments)    Unable to tolerate  . Lisinopril Cough  . Pravastatin Other (See Comments)    Constipation.  . Prednisone Other (See  Comments)    Shaking (PILLS) Shot is ok    Medications Reviewed Today    Reviewed by Leona Singleton, RN (Registered Nurse) on 09/07/18 at 1253  Med List Status: <None>  Medication Order Taking? Sig Documenting Provider Last Dose Status Informant  albuterol (PROAIR HFA) 108 (90 Base) MCG/ACT inhaler [id number] Yes Inhale 2 puffs into the lungs every 4 (four) hours as needed for wheezing or shortness of breath. Collene Gobble, MD Taking Active Self  albuterol (PROVENTIL) (2.5 MG/3ML) 0.083% nebulizer solution [id number] Yes Take 3 mLs (2.5 mg total) by nebulization every 4 (four) hours as needed for wheezing or shortness of breath. Collene Gobble, MD Taking Active Self  aspirin EC 81 MG tablet [id number] Yes Take 1 tablet (81 mg total) by mouth daily. Arbutus Leas, NP Taking Active Self  budesonide (PULMICORT) 0.5 MG/2ML nebulizer solution [id number] Yes Take 2 mLs (0.5 mg total) by nebulization 2 (two) times daily. Collene Gobble, MD Taking Active Self  calcium carbonate (OS-CAL - DOSED IN MG OF ELEMENTAL CALCIUM) 1250 (500 Ca) MG tablet [id number] Yes Take 1 tablet by mouth 2 (two) times daily with a meal. [provider] Taking Active Self  cholecalciferol (VITAMIN D) 1000 units tablet [id number] Yes Take 1,000 Units by mouth daily. [provider] Taking Active Self  clopidogrel (PLAVIX) 75 MG tablet [id number] Yes TAKE 1 TABLET BY MOUTH ONCE DAILY Sherren Mocha, MD Taking Active  denosumab (PROLIA) 60 MG/ML SOLN injection [id number] Yes Inject 60 mg into the skin every 6 (six) months. Administer in upper arm, thigh, or abdomen Ria Bush, MD Taking Active Self           Med Note Sandford Craze   Thu Dec 23, 2017  9:55 AM)    denosumab (PROLIA) 60 MG/ML SOSY injection [id number] Yes Inject 60 mg into the skin every 6 (six) months. Jodelle Green, FNP Taking Active   fluticasone (FLONASE) 50 MCG/ACT nasal spray [id number] Yes INSTILL 2 SPRAYS INTO BOTH NOSTRILS ONCEDAILY  AS DIRECTED Collene Gobble, MD Taking Active Self  furosemide (LASIX) 20 MG tablet [id number] Yes TAKE 1 TABLET BY MOUTH ONCE DAILY. MAY TAKE AN ADDITIONAL 1/2 TABLET IF NEEDED FOR SWELLING Sherren Mocha, MD Taking Active   ipratropium-albuterol (DUONEB) 0.5-2.5 (3) MG/3ML nebulizer solution 3 mL [id number]   Lauraine Rinne, NP  Active   ipratropium-albuterol (DUONEB) 0.5-2.5 (3) MG/3ML SOLN [id number] Yes INHALE 1 VIAL VIA NEBULIZER FOUR TIMES ADAY OR EVERY 4 HOURS AS NEEDED AS DIRECTED. Collene Gobble, MD Taking Active   isosorbide mononitrate (IMDUR) 30 MG 24 hr tablet [id number] Yes TAKE 1 TABLET BY MOUTH ONCE DAILY Sherren Mocha, MD Taking Active   loratadine (CLARITIN) 10 MG tablet [id number] Yes Take 10 mg by mouth daily as needed for allergies or rhinitis.  [provider] Taking Active Self  nitroGLYCERIN (NITROSTAT) 0.4 MG SL tablet [id number] Yes Place 1 tablet (0.4 mg total) under the tongue every 5 (five) minutes as needed for chest pain. Liliane Shi, PA-C Taking Active Self           Med Note Darryll Capers Apr 28, 2018 10:48 AM)    OXYGEN [id number] Yes Inhale 2 L into the lungs. [provider] Taking Active   pantoprazole (PROTONIX) 40 MG tablet [id number] Yes Take 1 tablet (40 mg total) by mouth 2 (two) times daily. Collene Gobble, MD Taking Active Self  prednisoLONE acetate (PRED FORTE) 1 % ophthalmic suspension [id number] Yes Place 1 drop into both eyes daily.  [provider] Taking Active Self  simvastatin (ZOCOR) 40 MG tablet [id number] Yes TAKE 1 TABLET BY MOUTH AT BEDTIME Sherren Mocha, MD Taking Active Self  Spacer/Aero-Holding Chambers (AEROCHAMBER MV) inhaler [id number] Yes Use as instructed Collene Gobble, MD Taking Active Self  spironolactone (ALDACTONE) 25 MG tablet [id number] Yes TAKE 1/2 TABLET (12.MG TOTAL) BY MOUTH DAILY Sherren Mocha, MD Taking Active           Medication Assistance Findings:  Medication assistance needs  identified.   Extra Help:   []  Already receiving Full Extra Help  []  Already receiving Partial Extra Help  [x]  Eligible based on reported income and assets (possibly - depending on if patient still listed on the deed for a home in [facility])  []  Not Eligible based on reported income and assets  Patient Assistance Programs: None  Typically, nebulizers are billed via Medicare Part B which provides 20% co-insurance payment for patient.    3-way call placed to patient's pharmacy, [facility].  Per pharmacist, billing Medicare Part D for copay $27.08 for 15 day supply.  Price doubles for 30 day supply so no benefit in getting larger quantity.  Pharmacy not contracted to bill Medicare Part B.  Estimated cost of drug is $140 / 15 day supply, therefore 20% of this is similar to current co-pay through Medicare Part D.    Additional medication  assistance options reviewed with patient as warranted:  Coupon and Discount pharmacy lists  Plan: Assisted patient with applying for Extra Help. Will f/u up with her in 1 month to see if she has been approved.  Patient aware that she may not be approved depending on her resources ([employer] funds + property).   Ralene Bathe, PharmD, [place] [phone]

## 2018-09-15 ENCOUNTER — Telehealth: Payer: Self-pay | Admitting: Emergency Medicine

## 2018-09-15 MED ORDER — DOXYCYCLINE HYCLATE 100 MG PO TABS: 100.0000 mg | ORAL_TABLET | Freq: Two times a day (BID) | ORAL | 0 refills | Status: DC

## 2018-09-15 NOTE — Telephone Encounter (Signed)
Primary Pulmonologist: Dr. Lamonte Sakai  Last office visit and with whom: 08/01/2017, Dr. Lamonte Sakai What do we see them for (pulmonary problems): COPD, respiratory failure Last OV assessment/plan: Patient Instructions by Collene Gobble, MD at 08/01/2018 11:00 AM  Author: Collene Gobble, MD Author Type: Physician Filed: 08/01/2018 11:18 AM  Note Status: Signed Cosign: Cosign Not Required Encounter Date: 08/01/2018  Editor: Collene Gobble, MD (Physician)    Please continue your DuoNeb 4 times a day as you have been taking it Please continue budesonide nebulizers 2 times a day as you have been taking them Continue your oxygen at all times, 2 L/min We will plan to repeat your CT scan of the chest in 6 months.  If you develop new symptoms of breathing difficulty, chest discomfort, cough or any alarming respiratory symptoms we may decide to repeat your CT sooner. Follow with Dr Lamonte Sakai in 6 months or sooner if you have any problems        Was appointment offered to patient (explain)?  She denied due to the weather and not having transportation   Reason for call: Pt has a cough but unable to cough anything up. Her symptoms started Monday. She is SOB more than usual and wheezing some. Her voice sounds hoarse and she does have a sore throat. She has taken Mucinex for her symptoms and has helped some. She is on oxygen but hasn't increased her flow during this time. She is requesting medication for her symptoms.  I don't see Dr. Lamonte Sakai on the schedule, Beth please advise.   ([place]

## 2018-09-15 NOTE — Telephone Encounter (Signed)
Pt returning call CB# [phone]//[phone]

## 2018-09-15 NOTE — Telephone Encounter (Signed)
Sending in course of doxycycline. She has an allergy to prednisone. Encourage she use nebulizer every 4-6 hours. If not better or requiring more oxygen needs office visit.

## 2018-09-15 NOTE — Telephone Encounter (Signed)
Patient is aware meds have been sent by [facility] nothing further needed at this time.

## 2018-09-15 NOTE — Telephone Encounter (Signed)
lmom 

## 2018-09-21 DIAGNOSIS — R269 Unspecified abnormalities of gait and mobility: Secondary | ICD-10-CM | POA: Diagnosis not present

## 2018-09-21 DIAGNOSIS — R911 Solitary pulmonary nodule: Secondary | ICD-10-CM | POA: Diagnosis not present

## 2018-09-21 DIAGNOSIS — J449 Chronic obstructive pulmonary disease, unspecified: Secondary | ICD-10-CM | POA: Diagnosis not present

## 2018-09-21 DIAGNOSIS — E785 Hyperlipidemia, unspecified: Secondary | ICD-10-CM | POA: Diagnosis not present

## 2018-10-10 ENCOUNTER — Other Ambulatory Visit: Payer: Self-pay | Admitting: Pharmacist

## 2018-10-10 ENCOUNTER — Other Ambulatory Visit: Payer: Self-pay

## 2018-10-10 ENCOUNTER — Other Ambulatory Visit: Payer: Self-pay | Admitting: Cardiovascular Disease

## 2018-10-10 MED ORDER — SIMVASTATIN 40 MG PO TABS: 40.0000 mg | ORAL_TABLET | Freq: Every day | ORAL | 2 refills | Status: DC

## 2018-10-10 NOTE — Patient Outreach (Signed)
[facility] [facility]) Care Management  [place]  10/10/2018  [place] Aug 13, 1940 [id number]   Reason for call: f/u on Extra Help  Unsuccessful telephone call attempt #1 to patient.   HIPAA compliant voicemail left requesting a return call  Plan:  I will make another outreach attempt to patient within 3-4 business days   Ralene Bathe, PharmD, [place] [phone]

## 2018-10-13 ENCOUNTER — Other Ambulatory Visit: Payer: Self-pay | Admitting: Pharmacist

## 2018-10-13 ENCOUNTER — Ambulatory Visit: Payer: Self-pay | Admitting: Pharmacist

## 2018-10-13 NOTE — Patient Outreach (Signed)
[place] [facility]) Care Management  [place] 10/13/2018  [place] Feb 10, 1941 [id number]  Reason for call: f/u on Extra Help application  Unsuccessful telephone call attempt #2 to patient. HIPAA compliant voicemail left requesting a return call  Plan:  Will make 3rd attempt to reach patient in 1-2 weeks.    Ralene Bathe, PharmD, [place] [phone]

## 2018-10-19 DIAGNOSIS — R079 Chest pain, unspecified: Secondary | ICD-10-CM | POA: Diagnosis not present

## 2018-10-20 DIAGNOSIS — J449 Chronic obstructive pulmonary disease, unspecified: Secondary | ICD-10-CM | POA: Diagnosis not present

## 2018-10-20 DIAGNOSIS — R269 Unspecified abnormalities of gait and mobility: Secondary | ICD-10-CM | POA: Diagnosis not present

## 2018-10-20 DIAGNOSIS — R911 Solitary pulmonary nodule: Secondary | ICD-10-CM | POA: Diagnosis not present

## 2018-10-20 DIAGNOSIS — E785 Hyperlipidemia, unspecified: Secondary | ICD-10-CM | POA: Diagnosis not present

## 2018-10-26 ENCOUNTER — Other Ambulatory Visit: Payer: Self-pay | Admitting: *Deleted

## 2018-10-26 NOTE — Patient Outreach (Signed)
[place] [facility]) Care Management  10/26/2018  [name] [birth year] [id number]   [place] Initial Assessment  Referral Date:07/11/2018 Referral Source:Transfer from [place] Reason for Referral:Continued Disease Management Education Insurance:[employer]   Outreach Attempt:  Successful telephone outreach to patient for case closure and transition to [facility].  HIPAA verified with patient.  Patient confirmed she is enrolled with [facility].  RN Health Coach thanked patient for working with [facility] and notified her of discontinuation of services at this time due to working with another care management program.  Notified patient [name] will outreach to patient one more time to assist with extra help application.  Plan:  RN Health Coach will close Disease Management case at this time, will leave patient active while [place] trying to assist patient.  [facility] Pharmacist [name] to make patient inactive when assistance completed.  RN Health Coach will send patient Case Closure Letter.  RN Health Coach will send primary care provider Case Closure Letter.  [place] [phone] [name]

## 2018-10-31 ENCOUNTER — Ambulatory Visit: Payer: Self-pay | Admitting: Pharmacist

## 2018-10-31 ENCOUNTER — Other Ambulatory Visit: Payer: Self-pay | Admitting: Pharmacist

## 2018-10-31 NOTE — Patient Outreach (Signed)
[place] [facility]) Care Management  [place] 10/31/2018  [place] 1940-10-01 [id number]  Reason for call: f/u on Extra Help application  Successful call to [name] this morning.  Patient reports she was denied Extra Help LIS due to her annuities. She states she continues to pay for her nebulizers for now through [place].  She is staying in contact with her pulmonologist and will reach out to him if she cannot afford them any longer.  No other questions or concerns at this time.  Patient has my contact information if she needs to reach me in the future.   Plan: Will close [facility] pharmacy case at this time.   Ralene Bathe, PharmD, [place] [phone]

## 2018-11-16 ENCOUNTER — Telehealth: Payer: Self-pay | Admitting: Emergency Medicine

## 2018-11-16 MED ORDER — PANTOPRAZOLE SODIUM 40 MG PO TBEC: 40.0000 mg | DELAYED_RELEASE_TABLET | Freq: Two times a day (BID) | ORAL | 6 refills | Status: DC

## 2018-11-16 MED ORDER — BUDESONIDE 0.5 MG/2ML IN SUSP: 0.5000 mg | Freq: Two times a day (BID) | RESPIRATORY_TRACT | 6 refills | Status: DC

## 2018-11-16 MED ORDER — IPRATROPIUM-ALBUTEROL 0.5-2.5 (3) MG/3ML IN SOLN: RESPIRATORY_TRACT | 5 refills | Status: DC

## 2018-11-16 NOTE — Telephone Encounter (Signed)
LMTCB

## 2018-11-16 NOTE — Telephone Encounter (Signed)
Called & spoke w/ pt. Last seen 09/15/2018 by RB. Pt needed refills on her Duoneb, budesonide, and pantoprazole--all of which were originally prescribed by RB. According to [place] notes, RB recommended pt continue on these medications. I informed pt I will be sending refills in for Duoneb, budesonide, and pantoprazole, verified her pharmacy [facility]. Pt verbalized understanding. Refill orders have been placed. Nothing further needed at this time.

## 2018-11-16 NOTE — Telephone Encounter (Signed)
Pt is returning call. Cb is [phone].

## 2018-11-20 DIAGNOSIS — R911 Solitary pulmonary nodule: Secondary | ICD-10-CM | POA: Diagnosis not present

## 2018-11-20 DIAGNOSIS — E785 Hyperlipidemia, unspecified: Secondary | ICD-10-CM | POA: Diagnosis not present

## 2018-11-20 DIAGNOSIS — R269 Unspecified abnormalities of gait and mobility: Secondary | ICD-10-CM | POA: Diagnosis not present

## 2018-11-20 DIAGNOSIS — J449 Chronic obstructive pulmonary disease, unspecified: Secondary | ICD-10-CM | POA: Diagnosis not present

## 2018-11-27 IMAGING — DX DG CHEST 1V PORT
1 series · 1 of 1 positions shown · non-contrast
Comparison: Chest radiograph performed 05/03/2017, and CT of the
chest performed 07/02/2017

CLINICAL DATA: Acute onset of shortness of breath.

EXAM:
PORTABLE CHEST 1 VIEW

[chest ap]
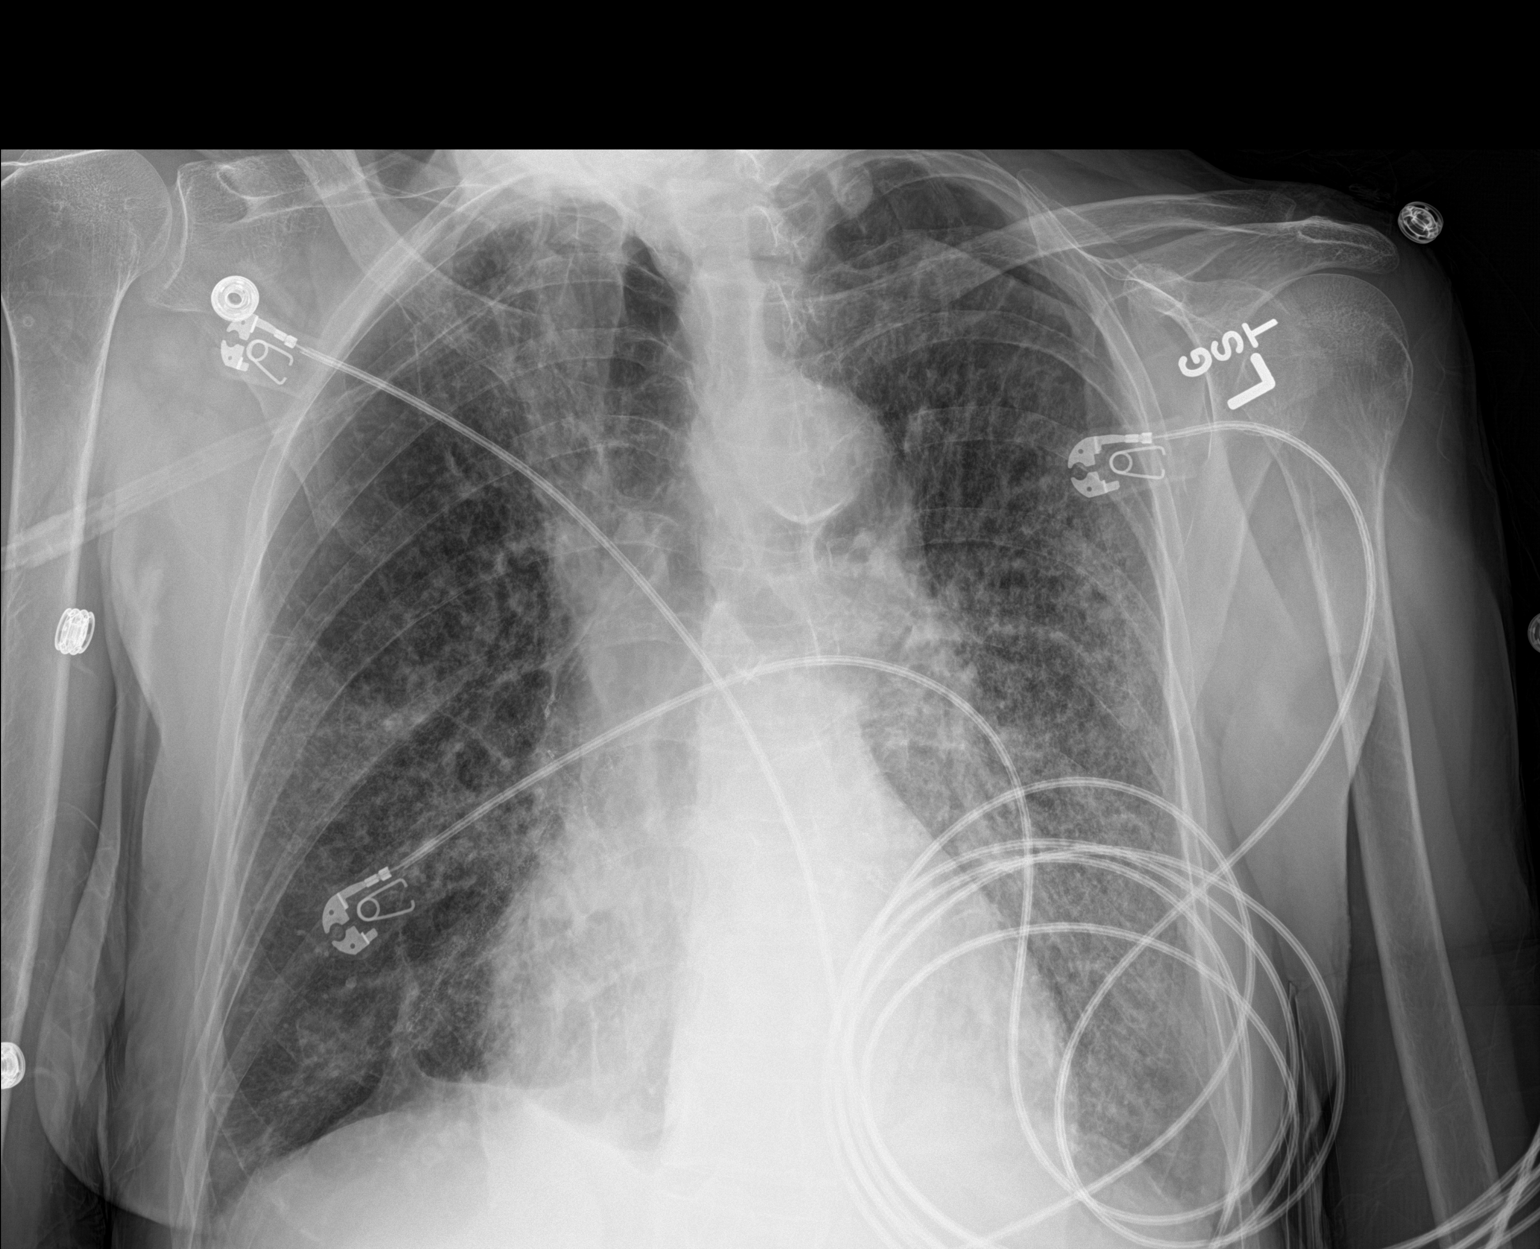

[1 of 1 positions shown; findings below may reference images not displayed]

FINDINGS: The lungs are well-aerated. Vascular congestion is noted. Increased
interstitial markings raise concern for pulmonary edema. There is no
evidence of pleural effusion or pneumothorax. Known pulmonary
nodules are not well characterized on radiograph.

The cardiomediastinal silhouette is borderline enlarged. No acute
osseous abnormalities are seen.
IMPRESSION: Vascular congestion and borderline cardiomegaly. Increased
interstitial markings raise concern for pulmonary edema.

## 2018-11-28 ENCOUNTER — Other Ambulatory Visit: Payer: Self-pay | Admitting: Cardiovascular Disease

## 2018-11-28 DIAGNOSIS — I2510 Atherosclerotic heart disease of native coronary artery without angina pectoris: Secondary | ICD-10-CM

## 2018-11-28 MED ORDER — NITROGLYCERIN 0.4 MG SL SUBL: 0.4000 mg | SUBLINGUAL_TABLET | SUBLINGUAL | 5 refills | Status: AC | PRN

## 2018-11-28 NOTE — Telephone Encounter (Signed)
Pt's medication was sent to pt's pharmacy as requested. Confirmation received.  °

## 2018-12-02 ENCOUNTER — Ambulatory Visit: Payer: PPO | Admitting: *Deleted

## 2018-12-12 ENCOUNTER — Ambulatory Visit: Payer: PPO

## 2018-12-15 ENCOUNTER — Encounter: Payer: PPO | Admitting: Family Medicine

## 2018-12-20 DIAGNOSIS — R269 Unspecified abnormalities of gait and mobility: Secondary | ICD-10-CM | POA: Diagnosis not present

## 2018-12-20 DIAGNOSIS — J449 Chronic obstructive pulmonary disease, unspecified: Secondary | ICD-10-CM | POA: Diagnosis not present

## 2018-12-20 DIAGNOSIS — E785 Hyperlipidemia, unspecified: Secondary | ICD-10-CM | POA: Diagnosis not present

## 2018-12-20 DIAGNOSIS — R911 Solitary pulmonary nodule: Secondary | ICD-10-CM | POA: Diagnosis not present

## 2018-12-30 ENCOUNTER — Telehealth: Payer: Self-pay | Admitting: Emergency Medicine

## 2018-12-30 NOTE — Telephone Encounter (Signed)
Pt is calling asking to speak with Dr. Lamonte Sakai directly about her CT scan scheduled for 01/10/2019 at 10:30 AM. I explained to pt that Dr. Lamonte Sakai is not in the office currently and is working in the hospital with the [place] patients. I offered to route message to provider of the day and I offered pt appt in-office sooner, both of which pt denied. Pt insisted on speaking with RB only.   RB, please advise. Pt is very concerned about the need for this CT scan and what you can do for her. Thank you.

## 2019-01-02 ENCOUNTER — Telehealth: Payer: Self-pay | Admitting: *Deleted

## 2019-01-02 NOTE — Telephone Encounter (Signed)
Called and scheduled pt appt. °

## 2019-01-02 NOTE — Telephone Encounter (Signed)
Copied from [place] [id number]. Topic: Appointment Scheduling - Scheduling Inquiry for Clinic >> Dec 30, 2018  4:47 PM Reyne Dumas L wrote: Reason for CRM:   Pt states she needs to make a physical appointment, regular hemoccult checks, and get her handicapped parking pass renewed.

## 2019-01-06 ENCOUNTER — Telehealth: Payer: Self-pay | Admitting: Emergency Medicine

## 2019-01-06 NOTE — Telephone Encounter (Signed)
Called pt and advised message from the provider. Pt understood and verbalized understanding. Nothing further is needed.    

## 2019-01-06 NOTE — Telephone Encounter (Signed)
Addressed in another phone note

## 2019-01-06 NOTE — Telephone Encounter (Signed)
Please let [name] know that I support her decision to hold off on the CT scan.  I think as we go forward we will always need to balance the cost versus the value of the information that we will received from the scans.  I would like to follow-up with her per her usual schedule so that we can decide going forward if/when to repeat the CT.

## 2019-01-06 NOTE — Telephone Encounter (Signed)
Spoke with [name], she states she wanted to let Dr. Lamonte Sakai know that she would not be able to get the CT scan done due to financial reasons. She wanted me to cancel her appt on 01/12/2019 with Tonya. She just wanted Dr. Lamonte Sakai to know, she is going to cancel her CT appt. FYI Dr. Balinda Quails, Jory Sims, LPN   12/29/76 4:69 PM Note   [name] is calling asking to speak with Dr. Lamonte Sakai directly about her CT scan scheduled for 01/10/2019 at 10:30 AM. I explained to [name] that Dr. Lamonte Sakai is not in the office currently and is working in the hospital with the [place] patients. I offered to route message to provider of the day and I offered [name] appt in-office sooner, both of which [name] denied. [name] insisted on speaking with RB only.   RB, please advise. [name] is very concerned about the need for this CT scan and what you can do for her. Thank you.

## 2019-01-09 ENCOUNTER — Other Ambulatory Visit: Payer: Self-pay | Admitting: Cardiovascular Disease

## 2019-01-10 ENCOUNTER — Ambulatory Visit: Payer: PPO

## 2019-01-11 ENCOUNTER — Telehealth: Payer: Self-pay | Admitting: Emergency Medicine

## 2019-01-11 MED ORDER — FLUTICASONE PROPIONATE 50 MCG/ACT NA SUSP: NASAL | 5 refills | Status: AC

## 2019-01-11 NOTE — Telephone Encounter (Signed)
Called and spoke with Patient.  Patient stated she is needing a refill for flonase.  Patient stated she used flonase, and felt like it helped her.  Last prescription was 11/2017, with 5 refills.  Patient requested [facility].  Prescription request sent to requested pharmacy.  Nothing further at this time.

## 2019-01-12 ENCOUNTER — Ambulatory Visit: Payer: PPO | Admitting: Nurse Practitioner

## 2019-01-12 ENCOUNTER — Telehealth: Payer: Self-pay

## 2019-01-12 NOTE — Telephone Encounter (Signed)
Called Pt back she couldn't remember who called her or why, but it was not NP Lauren Guse or me. I tried to find out who called her but I didn't find out anything

## 2019-01-12 NOTE — Telephone Encounter (Signed)
Copied from [place] [id number]. Topic: General - Inquiry >> Jan 12, 2019 12:22 PM Richardo Priest, NT wrote: Reason for CRM: Patient returning call. Call back is [phone].

## 2019-01-17 ENCOUNTER — Ambulatory Visit (INDEPENDENT_AMBULATORY_CARE_PROVIDER_SITE_OTHER): Payer: PPO | Admitting: Family Medicine

## 2019-01-17 ENCOUNTER — Encounter: Payer: Self-pay | Admitting: Family Medicine

## 2019-01-17 ENCOUNTER — Other Ambulatory Visit: Payer: Self-pay

## 2019-01-17 ENCOUNTER — Ambulatory Visit: Payer: PPO

## 2019-01-17 VITALS — BP 142/80 | HR 48 | Temp 97.8°F | Resp 20 | Ht 66.0 in | Wt 129.0 lb

## 2019-01-17 DIAGNOSIS — I10 Essential (primary) hypertension: Secondary | ICD-10-CM

## 2019-01-17 DIAGNOSIS — J449 Chronic obstructive pulmonary disease, unspecified: Secondary | ICD-10-CM

## 2019-01-17 DIAGNOSIS — Z780 Asymptomatic menopausal state: Secondary | ICD-10-CM | POA: Diagnosis not present

## 2019-01-17 DIAGNOSIS — Z1231 Encounter for screening mammogram for malignant neoplasm of breast: Secondary | ICD-10-CM

## 2019-01-17 DIAGNOSIS — Z1211 Encounter for screening for malignant neoplasm of colon: Secondary | ICD-10-CM

## 2019-01-17 DIAGNOSIS — K219 Gastro-esophageal reflux disease without esophagitis: Secondary | ICD-10-CM | POA: Diagnosis not present

## 2019-01-17 DIAGNOSIS — M810 Age-related osteoporosis without current pathological fracture: Secondary | ICD-10-CM

## 2019-01-17 DIAGNOSIS — I5042 Chronic combined systolic (congestive) and diastolic (congestive) heart failure: Secondary | ICD-10-CM

## 2019-01-17 DIAGNOSIS — Z0000 Encounter for general adult medical examination without abnormal findings: Secondary | ICD-10-CM

## 2019-01-17 LAB — COMPREHENSIVE METABOLIC PANEL
ALT: 12 U/L (ref 0–35)
AST: 17 U/L (ref 0–37)
Albumin: 4.3 g/dL (ref 3.5–5.2)
Alkaline Phosphatase: 79 U/L (ref 39–117)
BUN: 17 mg/dL (ref 6–23)
CO2: 31 mEq/L (ref 19–32)
Calcium: 9.8 mg/dL (ref 8.4–10.5)
Chloride: 102 mEq/L (ref 96–112)
Creatinine, Ser: 0.86 mg/dL (ref 0.40–1.20)
GFR: 63.8 mL/min (ref >60.00)
Glucose, Bld: 82 mg/dL (ref 70–99)
Potassium: 3.9 mEq/L (ref 3.5–5.1)
Sodium: 140 mEq/L (ref 135–145)
Total Bilirubin: 0.5 mg/dL (ref 0.2–1.2)
Total Protein: 6.6 g/dL (ref 6.0–8.3)

## 2019-01-17 LAB — CBC
HCT: 38.5 % (ref 36.0–46.0)
Hemoglobin: 12.8 g/dL (ref 12.0–15.0)
MCHC: 33.2 g/dL (ref 30.0–36.0)
MCV: 95.1 fl (ref 78.0–100.0)
Platelets: 269 10*3/uL (ref 150.0–400.0)
RBC: 4.04 Mil/uL (ref 3.87–5.11)
RDW: 13.9 % (ref 11.5–15.5)
WBC: 7.5 10*3/uL (ref 4.0–10.5)

## 2019-01-17 LAB — LIPID PANEL
Cholesterol: 159 mg/dL (ref 0–200)
HDL: 74.8 mg/dL (ref >39.00)
LDL Cholesterol: 71 mg/dL (ref 0–99)
NonHDL: 83.92
Total CHOL/HDL Ratio: 2
Triglycerides: 66 mg/dL (ref 0.0–149.0)
VLDL: 13.2 mg/dL (ref 0.0–40.0)

## 2019-01-17 LAB — MAGNESIUM: Magnesium: 1.9 mg/dL (ref 1.5–2.5)

## 2019-01-17 LAB — TSH: TSH: 3.1 u[IU]/mL (ref 0.35–4.50)

## 2019-01-17 LAB — VITAMIN D 25 HYDROXY (VIT D DEFICIENCY, FRACTURES): VITD: 93.07 ng/mL (ref 30.00–100.00)

## 2019-01-17 NOTE — Progress Notes (Signed)
Subjective:    Patient ID: [name], female    DOB: Jan 05, 1941, 78 y.o.   MRN: [id number]  HPI    Patient presents to clinic for physical exam.  Overall she is feeling well and at her baseline.    She does follow regularly with pulmonology and cardiology for management of her severe COPD, congestive heart failure/A. Fib and use of chronic oxygen via nasal cannula.  BP is stable on current meds  Denies any heartburn breakthrough. Uses protonix with good effect.   Believes her last mammogram was 2 years ago.  Last DEXA scan was in 2019.  She is on Prolia.  Due for Prolia this month.  States her appetite has been normal.  Tries to do a little bit of walking each day, but does not have great stamina related to her breathing.  Patient states she does see dentist every 6 to 12 months, and sees eye doctor every year.  She always wears seatbelt when in vehicle.  Patient Active Problem List   Diagnosis Date Noted  . Acute non-recurrent maxillary sinusitis 06/14/2018  . Pulmonary edema 03/18/2018  . Atrial fibrillation with rapid ventricular response ([place])   . Pressure injury of skin 02/25/2018  . Syncope 02/24/2018  . AF (paroxysmal atrial fibrillation) ([place]) 02/24/2018  . Chronic respiratory failure with hypoxia ([place]) 11/23/2017  . PVC's (premature ventricular contractions) 08/31/2017  . Skin rash 08/02/2017  . History of ST elevation myocardial infarction (STEMI) 07/06/2017  . Dyspnea 05/03/2017  . DNR (do not resuscitate) 12/07/2016  . Allergic rhinitis 07/31/2016  . Productive cough 06/29/2016  . Symptomatic bradycardia 04/01/2016  . Malnutrition of moderate degree 03/20/2016  . COPD (chronic obstructive pulmonary disease) ([place]) 03/19/2016  . Lumbar pain with radiation down left leg 03/02/2016  . Underweight 10/07/2015  . Vertigo 04/22/2015  . Skin nodule 04/22/2015  . Health maintenance examination 04/08/2015  . Advanced care planning/counseling discussion  04/08/2015  . Vitamin D insufficiency 02/02/2015  . CKD (chronic kidney disease) stage 3, GFR 30-59 ml/min (HCC)   . History of CVA (cerebrovascular accident) without residual deficits   . Prediabetes   . Ischemic cardiomyopathy 02/27/2014  . Osteoporosis 09/24/2013  . Pulmonary nodules 10/26/2012  . History of TIA (transient ischemic attack) 07/08/2012  . Cancer of lower lobe of right lung ([place]) 04/21/2012  . Acute on chronic combined systolic and diastolic CHF (congestive heart failure) ([place]) 06/17/2009  . HLD (hyperlipidemia) 04/24/2009  . CAD (coronary artery disease) 04/24/2009  . GERD 04/24/2009  . Macular degeneration (senile) of retina 04/23/2009  . Essential hypertension 04/23/2009  . Osteoarthritis 04/23/2009   Social History   Tobacco Use  . Smoking status: Former Smoker    Packs/day: 0.50    Years: 42.00    Pack years: 21.00    Types: Cigarettes    Quit date: 07/27/2002    Years since quitting: 16.4  . Smokeless tobacco: Never Used  Substance Use Topics  . Alcohol use: No    Alcohol/week: 0.0 standard drinks   Past Surgical History:  Procedure Laterality Date  . BREAST BIOPSY Left   . CARDIAC CATHETERIZATION N/A 02/24/2016   Procedure: Left Heart Cath and Coronary Angiography;  Surgeon: Belva Crome, MD;  Location: [facility];  Service: Cardiovascular;  Laterality: N/A;  . CARDIAC CATHETERIZATION N/A 07/09/2016   Procedure: Left Heart Cath and Coronary Angiography;  Surgeon: Peter M Martinique, MD;  Location: [facility];  Service: Cardiovascular;  Laterality: N/A;  .  CARDIAC CATHETERIZATION N/A 07/09/2016   Procedure: Coronary Stent Intervention;  Surgeon: Peter M Martinique, MD;  Location: [facility];  Service: Cardiovascular;  Laterality: N/A;  . CATARACT EXTRACTION Bilateral   . CORONARY STENT INTERVENTION N/A 07/06/2017   Procedure: CORONARY STENT INTERVENTION;  Surgeon: Lorretta Harp, MD;  Location: [facility];  Service:  Cardiovascular;  Laterality: N/A;  . CORONARY STENT PLACEMENT  06/2016   DES to mid LAD  . CORONARY/GRAFT ACUTE MI REVASCULARIZATION N/A 07/06/2017   Procedure: Coronary/Graft Acute MI Revascularization;  Surgeon: Lorretta Harp, MD;  Location: [facility];  Service: Cardiovascular;  Laterality: N/A;  . EYE SURGERY    . LEFT HEART CATH AND CORONARY ANGIOGRAPHY N/A 07/06/2017   Procedure: LEFT HEART CATH AND CORONARY ANGIOGRAPHY;  Surgeon: Lorretta Harp, MD;  Location: [facility];  Service: Cardiovascular;  Laterality: N/A;  . LUNG REMOVAL, PARTIAL Right 2008  . PARTIAL HYSTERECTOMY  1986   irregular periods, ovaries remain   Family History  Problem Relation Age of [place]  . CAD Mother 22       MI  . Stroke Mother   . Hypertension Mother   . Heart attack Mother   . CAD Father 69  . Stroke Father   . Breast cancer Sister   . Diabetes Sister    Review of Systems  Constitutional: Negative for chills, and fever. Energy level at baseline HENT: Negative for congestion, ear pain, sinus pain and sore throat.   Eyes: Negative.   Respiratory: Negative for cough, wheezing. +SOB, chronic, on O2  Cardiovascular: Negative for chest pain, palpitations and leg swelling.  Gastrointestinal: Negative for abdominal pain, diarrhea, nausea and vomiting.  Genitourinary: Negative for dysuria, frequency and urgency.  Musculoskeletal: Negative for arthralgias and myalgias.  Skin: Negative for color change, pallor and rash.  Neurological: Negative for syncope, light-headedness and headaches.  Psychiatric/Behavioral: The patient is not nervous/anxious.       Objective:   Physical Exam Vitals signs and nursing note reviewed.  Constitutional:      General: She is not in acute distress.    Appearance: She is not toxic-appearing or diaphoretic.  HENT:     Head: Normocephalic and atraumatic.     Right Ear: Tympanic membrane, ear canal and external ear normal.     Left Ear: Tympanic  membrane, ear canal and external ear normal.  Eyes:     General: No scleral icterus.    Extraocular Movements: Extraocular movements intact.     Conjunctiva/sclera: Conjunctivae normal.     Pupils: Pupils are equal, round, and reactive to light.  Neck:     Musculoskeletal: Neck supple.  Cardiovascular:     Rate and Rhythm: Regular rhythm. Bradycardia present.     Comments: Loletha Grayer rate related to meds Pulmonary:     Effort: Pulmonary effort is normal. No respiratory distress.     Breath sounds: No wheezing, rhonchi or rales.     Comments: diminished sounds throughout.  Chest:     Breasts:        Right: No swelling, bleeding, inverted nipple, mass, nipple discharge, skin change or tenderness.        Left: No swelling, bleeding, inverted nipple, mass, nipple discharge, skin change or tenderness.  Abdominal:     General: Abdomen is flat. Bowel sounds are normal. There is no distension.     Palpations: Abdomen is soft.     Tenderness: There is no abdominal tenderness. There  is no guarding.  Musculoskeletal: Normal range of motion.        General: No swelling.     Right lower leg: No edema.     Left lower leg: No edema.  Lymphadenopathy:     Upper Body:     Right upper body: No supraclavicular, axillary or pectoral adenopathy.     Left upper body: No supraclavicular, axillary or pectoral adenopathy.  Skin:    General: Skin is warm and dry.     Capillary Refill: Capillary refill takes less than 2 seconds.     Coloration: Skin is not jaundiced or pale.  Neurological:     Mental Status: She is alert and oriented to person, place, and time. Mental status is at baseline.     Cranial Nerves: No cranial nerve deficit.     Motor: No weakness.     Gait: Gait (Walks slowly, but gait is normal. Does not use cane. ) normal.  Psychiatric:        Mood and Affect: Mood normal.        Behavior: Behavior normal.        Thought Content: Thought content normal.        Judgment: Judgment normal.      Today's Vitals   01/17/19 0940  BP: (!) 142/80  Pulse: (!) 48  Resp: 20  Temp: 97.8 F (36.6 C)  TempSrc: Oral  SpO2: 95%  Weight: 129 lb (58.5 kg)  Height: 5\' 6"  (1.676 m)   Body mass index is 20.82 kg/m.     Assessment & Plan:    Well adult exam-patient is a frail elderly woman.  Chronic medical conditions are stable at this current time.  We will get lab work in clinic today.  Reviewed healthy diet with patient, recommended diet full of lots of fruits and vegetables and good protein.  Patient does see dentist and eye doctor regularly.  Discussed safe sun practices including wearing SPF of at least 30, wearing a widebrimmed hat and wearing long sleeves.  Postmenopausal/osteoporosis- patient is on Prolia.  She is due for Prolia injection.  RN in office made aware and will be sure to get her Prolia approved.  Patient will return to clinic in about a week to get the Prolia injection once daily as approved by insurance.  Screening mammogram-order for mammogram placed.  Patient given information on where to call to get this set up.  COPD-patient follows regularly with pulmonology.  Wears O2 via nasal cannula chronically.  CHF/A. fib/hypertension- congestive heart failure is stable at this time.  She follows with cardiology regularly.  GERD-currently stable on current dose of Protonix.  Will be sure to include but labs.  Patient will follow-up here in 3 months for management of chronic medical conditions.  She is aware she can clinic sooner if any issues arise.  Also made patient aware that we will have flu vaccines available around September 2020.

## 2019-01-18 NOTE — Addendum Note (Signed)
Addended by: Philis Nettle on: 01/18/2019 09:09 AM   Modules accepted: Orders

## 2019-01-20 DIAGNOSIS — E785 Hyperlipidemia, unspecified: Secondary | ICD-10-CM | POA: Diagnosis not present

## 2019-01-20 DIAGNOSIS — R269 Unspecified abnormalities of gait and mobility: Secondary | ICD-10-CM | POA: Diagnosis not present

## 2019-01-20 DIAGNOSIS — R911 Solitary pulmonary nodule: Secondary | ICD-10-CM | POA: Diagnosis not present

## 2019-01-20 DIAGNOSIS — J449 Chronic obstructive pulmonary disease, unspecified: Secondary | ICD-10-CM | POA: Diagnosis not present

## 2019-01-25 ENCOUNTER — Telehealth: Payer: Self-pay | Admitting: Family Medicine

## 2019-01-25 ENCOUNTER — Other Ambulatory Visit: Payer: Self-pay

## 2019-01-25 ENCOUNTER — Ambulatory Visit (INDEPENDENT_AMBULATORY_CARE_PROVIDER_SITE_OTHER): Payer: PPO

## 2019-01-25 DIAGNOSIS — M810 Age-related osteoporosis without current pathological fracture: Secondary | ICD-10-CM | POA: Diagnosis not present

## 2019-01-25 MED ORDER — DENOSUMAB 60 MG/ML ~~LOC~~ SOSY
60.0000 mg | PREFILLED_SYRINGE | Freq: Once | SUBCUTANEOUS | Status: AC
  Administered 2019-01-25: 60 mg via SUBCUTANEOUS

## 2019-01-25 NOTE — Telephone Encounter (Signed)
Pt dropped off Disability Parking placard form to be filled out Placed in [employer] upfront Please mail to pt when completed

## 2019-01-25 NOTE — Telephone Encounter (Signed)
Called Pt to tell her the Parking form was filled out and mailed to her home.

## 2019-01-25 NOTE — Progress Notes (Signed)
Patient presented today for Prolia injection. Administered subcutaneously in left arm.  Administered without incident.  Patient tolerated well with no signs of distress.

## 2019-01-30 ENCOUNTER — Telehealth: Payer: Self-pay

## 2019-01-30 DIAGNOSIS — Z1211 Encounter for screening for malignant neoplasm of colon: Secondary | ICD-10-CM

## 2019-01-30 NOTE — Telephone Encounter (Signed)
She is talking about the stool cards/ifob kit  I think I ordered correctly --- we can mail her the kit and she can mail it back to [place] or drop off to [place]  Thanks!!  LG

## 2019-01-30 NOTE — Telephone Encounter (Signed)
Copied from [place] [id number]. Topic: General - Inquiry >> Jan 30, 2019 10:59 AM Scherrie Gerlach wrote: Reason for CRM: pt states they forgot to give her the [facility] [employer] kit, she thinks that is what she needs. For in home screening.  I advised pt I thought [employer] mails her the kit. Pt unsure if she signed order for the kit. Please let pt know.

## 2019-01-30 NOTE — Telephone Encounter (Signed)
Pt called [facility]  Reason for CRM: pt states they forgot to give her the colo guard kit, she thinks that is what she needs. For in home screening.  I advised pt I thought [employer] mails her the kit. Pt unsure if she signed order for the kit. Please let pt know.

## 2019-01-31 NOTE — Telephone Encounter (Signed)
Called Pt and she will come to the office 02/01/2019 to pick up the stool kit and supplies.

## 2019-02-17 ENCOUNTER — Telehealth: Payer: Self-pay | Admitting: Cardiovascular Disease

## 2019-02-17 NOTE — Telephone Encounter (Signed)
New Message   Patient is calling to request to speak with the nurse. She did not want to provide as to what questions she may have. Please call to discuss.

## 2019-02-17 NOTE — Telephone Encounter (Signed)
The patient states she feels great and has [name] nurse come to see her once a month. She would like to reschedule her appointment next week to a later date. Rescheduled her to October 6 with Richardson Dopp. She was grateful for assistance and understands to call if she changes her mind.

## 2019-02-19 DIAGNOSIS — J449 Chronic obstructive pulmonary disease, unspecified: Secondary | ICD-10-CM | POA: Diagnosis not present

## 2019-02-19 DIAGNOSIS — E785 Hyperlipidemia, unspecified: Secondary | ICD-10-CM | POA: Diagnosis not present

## 2019-02-19 DIAGNOSIS — R269 Unspecified abnormalities of gait and mobility: Secondary | ICD-10-CM | POA: Diagnosis not present

## 2019-02-19 DIAGNOSIS — R911 Solitary pulmonary nodule: Secondary | ICD-10-CM | POA: Diagnosis not present

## 2019-02-21 ENCOUNTER — Ambulatory Visit: Payer: PPO | Admitting: Physician Assistant

## 2019-03-15 ENCOUNTER — Other Ambulatory Visit: Payer: Self-pay

## 2019-03-15 DIAGNOSIS — Z20822 Contact with and (suspected) exposure to covid-19: Secondary | ICD-10-CM

## 2019-03-16 LAB — NOVEL CORONAVIRUS, NAA: SARS-CoV-2, NAA: NOT DETECTED

## 2019-03-22 DIAGNOSIS — R911 Solitary pulmonary nodule: Secondary | ICD-10-CM | POA: Diagnosis not present

## 2019-03-22 DIAGNOSIS — E785 Hyperlipidemia, unspecified: Secondary | ICD-10-CM | POA: Diagnosis not present

## 2019-03-22 DIAGNOSIS — J449 Chronic obstructive pulmonary disease, unspecified: Secondary | ICD-10-CM | POA: Diagnosis not present

## 2019-03-22 DIAGNOSIS — R269 Unspecified abnormalities of gait and mobility: Secondary | ICD-10-CM | POA: Diagnosis not present

## 2019-03-31 ENCOUNTER — Ambulatory Visit
Admission: RE | Admit: 2019-03-31 | Discharge: 2019-03-31 | Disposition: A | Discharge status: Home / Self Care | Payer: PPO | Source: Ambulatory Visit | Attending: Family Medicine | Admitting: Family Medicine

## 2019-03-31 DIAGNOSIS — Z1231 Encounter for screening mammogram for malignant neoplasm of breast: Secondary | ICD-10-CM | POA: Insufficient documentation

## 2019-04-04 ENCOUNTER — Telehealth: Payer: Self-pay | Admitting: Emergency Medicine

## 2019-04-04 NOTE — Telephone Encounter (Signed)
DISREGUARD MESSAGE JUST NEEDING TO MAKE AN APPOINT.[name]

## 2019-04-06 ENCOUNTER — Other Ambulatory Visit (INDEPENDENT_AMBULATORY_CARE_PROVIDER_SITE_OTHER): Payer: PPO

## 2019-04-06 DIAGNOSIS — Z1211 Encounter for screening for malignant neoplasm of colon: Secondary | ICD-10-CM

## 2019-04-06 LAB — FECAL OCCULT BLOOD, IMMUNOCHEMICAL: Fecal Occult Bld: NEGATIVE

## 2019-04-07 ENCOUNTER — Other Ambulatory Visit: Payer: Self-pay | Admitting: Cardiovascular Disease

## 2019-04-15 ENCOUNTER — Other Ambulatory Visit: Payer: Self-pay | Admitting: Cardiovascular Disease

## 2019-04-20 ENCOUNTER — Encounter: Payer: Self-pay | Admitting: Emergency Medicine

## 2019-04-20 ENCOUNTER — Ambulatory Visit: Payer: PPO | Admitting: Emergency Medicine

## 2019-04-20 ENCOUNTER — Other Ambulatory Visit: Payer: Self-pay

## 2019-04-20 DIAGNOSIS — J449 Chronic obstructive pulmonary disease, unspecified: Secondary | ICD-10-CM

## 2019-04-20 DIAGNOSIS — K219 Gastro-esophageal reflux disease without esophagitis: Secondary | ICD-10-CM | POA: Diagnosis not present

## 2019-04-20 DIAGNOSIS — J301 Allergic rhinitis due to pollen: Secondary | ICD-10-CM | POA: Diagnosis not present

## 2019-04-20 DIAGNOSIS — R918 Other nonspecific abnormal finding of lung field: Secondary | ICD-10-CM

## 2019-04-20 MED ORDER — BUDESONIDE 0.5 MG/2ML IN SUSP: 0.5000 mg | Freq: Two times a day (BID) | RESPIRATORY_TRACT | 11 refills | Status: AC

## 2019-04-20 NOTE — Assessment & Plan Note (Signed)
She has required antibiotics twice, steroids once this year.  She feels stable at this time.   Please continue your DuoNeb and Pulmicort nebulizers as you have been taking them. Keep your albuterol inhaler available to use 2 puffs if needed for shortness of breath, chest tightness, wheezing. Continue your oxygen at 2 L/min at all times. Flu shot is up-to-date Pneumonia shot is up-to-date Follow-up with Dr. Lamonte Sakai in December with a chest x-ray.  Call us sooner if you have any problems

## 2019-04-20 NOTE — Assessment & Plan Note (Signed)
Continue loratadine and fluticasone nasal spray as you have been using them.

## 2019-04-20 NOTE — Patient Instructions (Addendum)
Please continue your DuoNeb and Pulmicort nebulizers as you have been taking them. Keep your albuterol inhaler available to use 2 puffs if needed for shortness of breath, chest tightness, wheezing. Continue your oxygen at 2 L/min at all times. Flu shot is up-to-date Pneumonia shot is up-to-date Continue Protonix twice a day as you have been taking it Continue loratadine and fluticasone nasal spray as you have been using them. Follow-up with Dr. Lamonte Sakai in December with a chest x-ray.  Call us sooner if you have any problems

## 2019-04-20 NOTE — Assessment & Plan Note (Signed)
Enlarging left lower lobe nodule that is likely a slow-growing non-small cell lung cancer.  She understands this but wants to avoid therapy unless she requires palliation for symptoms as they evolve.  Based on this she wants to defer repeat CT scan.  We did agree to repeat a chest x-ray in December to look for interval change.  She would probably be receptive to conversation about any palliation if she does develop symptoms

## 2019-04-20 NOTE — Progress Notes (Signed)
Subjective:    Patient ID: [name], female    DOB: 10-Sep-1940, 78 y.o.   MRN: [id number]  HPI  ROV 04/20/2019 --follow-up visit for 78 year old woman with COPD, chronic cough in the setting of GERD, chronic rhinitis.  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  We have been following pulmonary nodular disease with serial CT scans of the chest.  Most recent scan was in 07/18/2018 - her LLL nodule is slowly enlarging.  At her last visit we decided to defer a 78-month follow-up.  We are attempting to balance the cost versus the value of the information that it might provide. She is still convinced that she would not want treatment as long as she does not have symptoms.   Her current medical regimen includes Pulmicort twice daily, duo nebs every 6 hours and as needed. Has albuterol but very rarely uses.  She is on loratadine, felt that she benefited from fluticasone nasal spray - she has some epistaxis on the L side so only uses it on the R.  Also on Protonix 40 mg twice daily She feels that her breathing is overall stable, although she is having trouble tolerating facemask. She is SOB in the am, gets some relief when she does her first DuoNeb in the am. She was treated with doxy in February, then with steroids / abx  in late May for a possible AE-COPD. Using O2 at 2L/min at all times, pulsed w exertion.    Review of Systems     As per history of present illness    Objective:   Physical Exam Vitals:   04/20/19 0938  BP: 136/84  Pulse: 83  SpO2: 97%    Gen: Anxious, elderly woman, no resp distress  ENT: No lesions,  mouth clear,  oropharynx clear, no postnasal drip, weak hoarse voice  Neck: No JVD, no stridor  Lungs: tachypneic, small breaths but no wheezing. No wheeze on forced exp  Cardiovascular: RRR, heart sounds normal, no murmur or gallops, no peripheral edema.   Musculoskeletal: No deformities, no cyanosis or  clubbing  Neuro: alert, non focal  Skin: Warm, no lesions or rashes      Assessment & Plan:  Pulmonary nodules Enlarging left lower lobe nodule that is likely a slow-growing non-small cell lung cancer.  She understands this but wants to avoid therapy unless she requires palliation for symptoms as they evolve.  Based on this she wants to defer repeat CT scan.  We did agree to repeat a chest x-ray in December to look for interval change.  She would probably be receptive to conversation about any palliation if she does develop symptoms  COPD (chronic obstructive pulmonary disease) ([place]) She has required antibiotics twice, steroids once this year.  She feels stable at this time.   Please continue your DuoNeb and Pulmicort nebulizers as you have been taking them. Keep your albuterol inhaler available to use 2 puffs if needed for shortness of breath, chest tightness, wheezing. Continue your oxygen at 2 L/min at all times. Flu shot is up-to-date Pneumonia shot is up-to-date Follow-up with Dr. Lamonte Sakai in December with a chest x-ray.  Call us sooner if you have any problems  GERD Continue Protonix twice a day as you have been taking it  Allergic rhinitis Continue loratadine and fluticasone nasal spray as you have been using them.  Baltazar Apo, MD, PhD 04/20/2019, 10:00 AM [facility] [phone] or if no  answer [phone]

## 2019-04-20 NOTE — Assessment & Plan Note (Signed)
Continue Protonix twice a day as you have been taking it

## 2019-05-02 ENCOUNTER — Ambulatory Visit: Payer: PPO | Admitting: Physician Assistant

## 2019-05-09 ENCOUNTER — Ambulatory Visit (INDEPENDENT_AMBULATORY_CARE_PROVIDER_SITE_OTHER): Payer: PPO | Admitting: Physician Assistant

## 2019-05-09 ENCOUNTER — Encounter: Payer: Self-pay | Admitting: Physician Assistant

## 2019-05-09 ENCOUNTER — Other Ambulatory Visit: Payer: Self-pay

## 2019-05-09 VITALS — BP 132/72 | HR 82 | Ht 66.0 in | Wt 124.6 lb

## 2019-05-09 DIAGNOSIS — E785 Hyperlipidemia, unspecified: Secondary | ICD-10-CM | POA: Diagnosis not present

## 2019-05-09 DIAGNOSIS — I493 Ventricular premature depolarization: Secondary | ICD-10-CM | POA: Diagnosis not present

## 2019-05-09 DIAGNOSIS — I25119 Atherosclerotic heart disease of native coronary artery with unspecified angina pectoris: Secondary | ICD-10-CM | POA: Diagnosis not present

## 2019-05-09 DIAGNOSIS — C3431 Malignant neoplasm of lower lobe, right bronchus or lung: Secondary | ICD-10-CM

## 2019-05-09 DIAGNOSIS — I5022 Chronic systolic (congestive) heart failure: Secondary | ICD-10-CM

## 2019-05-09 NOTE — Patient Instructions (Signed)
Medication Instructions:  If you start to have chest pain more frequent then you can increase Imdur to 45 mg once a day. If you do decide to increase this medication  please call our office and let us know.  If you need a refill on your cardiac medications before your next appointment, please call your pharmacy.   Lab work: None   If you have labs (blood work) drawn today and your tests are completely normal, you will receive your results only by: Marland Kitchen MyChart Message (if you have MyChart) OR . A paper copy in the mail If you have any lab test that is abnormal or we need to change your treatment, we will call you to review the results.  Testing/Procedures: None   Follow-Up: At [facility], you and your health needs are our priority.  As part of our continuing mission to provide you with exceptional heart care, we have created designated Provider Care Teams.  These Care Teams include your primary Cardiologist (physician) and Advanced Practice Providers (APPs -  Physician Assistants and Nurse Practitioners) who all work together to provide you with the care you need, when you need it. You will need a follow up appointment in:  6 months.  Please call our office 2 months in advance to schedule this appointment.  You may see Sherren Mocha, MD or one of the following Advanced Practice Providers on your designated Care Team: Richardson Dopp, PA-C   Any Other Special Instructions Will Be Listed Below (If Applicable).

## 2019-05-09 NOTE — Progress Notes (Signed)
Cardiology Office Note:    Date:  05/09/2019   ID:  [name], DOB Mar 22, 1941, MRN [id number]  PCP:  Jodi Green, FNP  Cardiologist:  Jodi Mocha, MD  Electrophysiologist:  Jodi Haw, MD  Pulmonologist: Dr. Lamonte [name]  Referring MD: Jodi Green, FNP   Chief Complaint  Patient presents with   Follow-up    CAD, CHF    History of Present Illness:    [name] is a 78 y.o. female with:   Coronary artery disease  S/p acute MI in 2010 >> PCI: BMS to the LCx  S/p ant STEMI 01/2016 >> occluded apical LAD treated medically   S/p NSTEMI 12/17 >> PCI:  DES to mid LAD  S/p Inf STEMI 12/18 >> cath w/ occluded distal apical PDA; POBA unsuccessful   Cannot tolerate beta-blocker or ACEi 2/2 chronic lung dz  Intol of Ticagrelor due to dyspnea (DCd 7/82)  Chronic Systolic CHF  Echo 95/6213: EF 30-35, mild to moderate MR  Echocardiogram 8/19: EF 30-35, mild MR  PVCs  Holter 03/2017: 22% PVCs, short ventricular runs  Mexiletine started by [name] 11/2017; pt DC'd due to SEs after 1 week  Not a candidate for Amio 2/2 chronic lung dz   Non-small cell lung CA  Status post resection  CT 12/19: Enlarging LLL nodule (hypermetabolic area on PET in 0/8657) suspicious for NSLC >> conservative mgmt  COPD on chronic O2  Hx of CVA 2013  Hypertension   Hyperlipidemia   Chronic kidney disease   Hx of syncope 02/2018 >> EMS reported AF w/ RVR (no strips to confirm) >> multiple poss etiologies; no further cardiac workup pursued; Lasix was DC'd   [name] was last seen by [name] in January 2020.  She returns for follow up.  She returns for follow-up.  She is here alone.  She has noted occasional episodes of left-sided chest discomfort.  This may last 30 to 60 minutes.  It does not seem to be very consistent.  She may go several weeks without chest symptoms.  It is not related to exertion.  She has no radiating symptoms.  She is chronically short of  breath and remains on chronic O2.  She has slept in her recliner for years.  She notes some lower extremity swelling.  This is often worse on the left than the right.  She has not had further syncope.  Prior CV studies:   The following studies were reviewed today:  Echocardiogram 03/18/2018 EF 30-35, inferolateral akinesis, grade 1 diastolic dysfunction, MAC, mild MR, mild LAE  Cardiac catheterization 07/06/17 LAD proximal 40, proximal stent patent, distal 100, D1 ostial 80 LCx proximal stent patent RPDA 100 PCI: Unsuccessful POBA  Echo 07/07/17 EF 30-35, global HK with severe inferior HK-AK, mild to moderate MR, moderate LAE, moderate TR, PASP 40  Holter monitor 04/07/17 The basic rhythm is sinus.  There are no significant bradyarrhythmias or pathologic pauses > 3 seconds There are frequent PVC's (22%), as well as short ventricular runs No atrial fibrillation or sustained tachyarrhythmias  Cardiac catheterization 07/09/16 LAD proximal 75, distal 100, D1 ostial 80 LCx stent patent with 10 ISR EF 25-35 PCI: 3 x 8 mm Promus Premier DES to the proximal LAD  Echo 06/28/16 Inferior/inferolateral HK, mild LVH, EF 30-35, mild AI, moderate MR, mild BAE, PASP 39  Echo 03/20/16 EF 40-45, anterolateral and inferolateral hypokinesis, grade 2 diastolic dysfunction, MAC, mild MR, mild LAE, PASP 45  LHC 7/17 LAD  proximal 65, distal 100, D1 ostial 65 LCx proximal stent patent with 10% ISR Acute coronary syndrome with occlusion of distal LAD in a small tortuous segment. Widely patent stent in the circumflex. Eccentric 65% stenosis in the mid LAD beyond the first agonal. 60% first diagonal ostial stenosis. Both sites are unchanged from prior angiogram. New finding of very distal LAD total occlusion. Dominant right coronary artery with luminal irregularities. Moderate left ventricular systolic dysfunction with regional wall motion abnormality including new apical severe hypokinesis and  suggestion of anterolateral wall akinesis/dyskinesis (old). Estimated ejection fraction is 35%. LVEDP is low normal.  Echo (12/13):  EF 45-50%, posterior wall akinesis, lateral wall and inferior wall hypokinesis, mild MR  Carotid US (12/13):  No significant ICA stenosis  Nuclear (10/10):  EF 32%, inf-lat scar; no ischemia  LHC (10/10):  Inferolateral akinesis and dyskinesis, EF 40%, D1 30%, LAD 50-60%, mid CFX stent patent, proximal RCA 30%, mid RCA 50%  Past Medical History:  Diagnosis Date   Arthritis    CAD (coronary artery disease) 03/2009   s/p MI   Chronic combined systolic and diastolic heart failure ([place]) 06/17/2009   Qualifier: Diagnosis of  By: Jodi Knack, MD, [name]    CKD (chronic kidney disease) stage 3, GFR 30-59 ml/min    Emphysema/COPD (HCC)    GERD (gastroesophageal reflux disease)    HCAP (healthcare-associated pneumonia) 8/56/3149   Helicobacter pylori gastritis 10/2007   treated   History of CVA (cerebrovascular accident) without residual deficits 01/2012, 06/2012   R hemorrhagic MCA 01/2012 with remote lacunar infarct L putamen and IC, rpt 06/2012 acute multifocal R MCA infarct with remote hemorrhagic strokes affecting L basal ganglia and periventricular white matter, full recovery   HLD (hyperlipidemia)    HTN (hypertension)    Ischemic cardiomyopathy 02/27/2014   Lung cancer ([place]) dx'd 07/2006   Lung CA, s/p resection, followed by Dr Earlie [name]   Macular degeneration    Myocardial infarction [facility]) 03/2009   Acute myocardial infarction 2010 - treated with BMS of LCx. LVEF 50% with subsequent CHF   NSTEMI (non-ST elevated myocardial infarction) ([place])    Osteoporosis 09/2013   T -3.6 forearm 09/2013, T -4.5 forearm 04/2015   Pre-diabetes    Syncope 02/24/2018   Surgical Hx: The patient  has a past surgical history that includes Lung removal, partial (Right, 2008); Partial hysterectomy (1986); Breast biopsy (Left); Cataract extraction  (Bilateral); Cardiac catheterization (N/A, 02/24/2016); Eye surgery; Coronary stent placement (06/2016); Cardiac catheterization (N/A, 07/09/2016); Cardiac catheterization (N/A, 07/09/2016); Coronary/Graft Acute MI Revascularization (N/A, 07/06/2017); LEFT HEART CATH AND CORONARY ANGIOGRAPHY (N/A, 07/06/2017); and CORONARY STENT INTERVENTION (N/A, 07/06/2017).   Current Medications: Current Meds  Medication Sig   albuterol (PROAIR HFA) 108 (90 Base) MCG/ACT inhaler Inhale 2 puffs into the lungs every 4 (four) hours as needed for wheezing or shortness of breath.   albuterol (PROVENTIL) (2.5 MG/3ML) 0.083% nebulizer solution Take 3 mLs (2.5 mg total) by nebulization every 4 (four) hours as needed for wheezing or shortness of breath.   aspirin EC 81 MG tablet Take 1 tablet (81 mg total) by mouth daily.   budesonide (PULMICORT) 0.5 MG/2ML nebulizer solution Take 2 mLs (0.5 mg total) by nebulization 2 (two) times daily.   calcium carbonate (OS-CAL - DOSED IN MG OF ELEMENTAL CALCIUM) 1250 (500 Ca) MG tablet Take 1 tablet by mouth 2 (two) times daily with a meal.   cholecalciferol (VITAMIN D) 1000 units tablet Take 1,000 Units by mouth daily.   clopidogrel (  PLAVIX) 75 MG tablet TAKE 1 TABLET BY MOUTH ONCE DAILY   denosumab (PROLIA) 60 MG/ML SOLN injection Inject 60 mg into the skin every 6 (six) months. Administer in upper arm, thigh, or abdomen   fluticasone (FLONASE) 50 MCG/ACT nasal spray INSTILL 2 SPRAYS INTO BOTH NOSTRILS ONCEDAILY AS DIRECTED   furosemide (LASIX) 20 MG tablet TAKE 1 TABLET BY MOUTH ONCE DAILY. MAY TAKE AN ADDITIONAL 1/2 TABLET IF NEEDED FOR SWELLING   ipratropium-albuterol (DUONEB) 0.5-2.5 (3) MG/3ML SOLN INHALE 1 VIAL VIA NEBULIZER FOUR TIMES ADAY OR EVERY 4 HOURS AS NEEDED AS DIRECTED.   isosorbide mononitrate (IMDUR) 30 MG 24 hr tablet TAKE 1 TABLET BY MOUTH ONCE A DAY   loratadine (CLARITIN) 10 MG tablet Take 10 mg by mouth daily as needed for allergies or rhinitis.      nitroGLYCERIN (NITROSTAT) 0.4 MG SL tablet Place 1 tablet (0.4 mg total) under the tongue every 5 (five) minutes as needed for chest pain.   OXYGEN Inhale 2 L into the lungs.   pantoprazole (PROTONIX) 40 MG tablet Take 1 tablet (40 mg total) by mouth 2 (two) times daily.   prednisoLONE acetate (PRED FORTE) 1 % ophthalmic suspension Place 1 drop into both eyes daily.    simvastatin (ZOCOR) 40 MG tablet Take 1 tablet (40 mg total) by mouth at bedtime.   Spacer/Aero-Holding Chambers (AEROCHAMBER MV) inhaler Use as instructed   spironolactone (ALDACTONE) 25 MG tablet TAKE 1/2 TABLET BY MOUTH DAILY   Current Facility-Administered Medications for the 05/09/19 encounter (Office Visit) with Richardson Dopp T, PA-C  Medication   ipratropium-albuterol (DUONEB) 0.5-2.5 (3) MG/3ML nebulizer solution 3 mL     Allergies:   Actonel [risedronate sodium], Amlodipine, Brilinta [ticagrelor], Codeine, Fosamax [alendronate sodium], Lisinopril, Pravastatin, and Prednisone   Social History   Tobacco Use   Smoking status: Former Smoker    Packs/day: 0.50    Years: 42.00    Pack years: 21.00    Types: Cigarettes    Quit date: 07/27/2002    Years since quitting: 16.7   Smokeless tobacco: Never Used  Substance Use Topics   Alcohol use: No    Alcohol/week: 0.0 standard drinks   Drug use: No     Family Hx: The patient's family history includes Breast cancer in her sister; CAD (age of onset: 42) in her father and mother; Diabetes in her sister; Heart attack in her mother; Hypertension in her mother; Stroke in her father and mother.  ROS:   Please see the history of present illness.    ROS All other systems reviewed and are negative.   EKGs/Labs/Other Test Reviewed:    EKG:  EKG is  ordered today.  The ekg ordered today demonstrates normal sinus rhythm, HR 71, normal axis, interventricular conduction delay, inferolateral T wave inversions, QTC 445, similar to prior tracings  Recent  Labs: 01/17/2019: ALT 12; BUN 17; Creatinine, Ser 0.86; Hemoglobin 12.8; Magnesium 1.9; Platelets 269.0; Potassium 3.9; Sodium 140; TSH 3.10   Recent Lipid Panel Lab Results  Component Value Date/Time   CHOL 159 01/17/2019 10:11 AM   TRIG 66.0 01/17/2019 10:11 AM   HDL 74.80 01/17/2019 10:11 AM   CHOLHDL 2 01/17/2019 10:11 AM   LDLCALC 71 01/17/2019 10:11 AM   LDLDIRECT 50.0 11/25/2016 11:31 AM    Physical Exam:    VS:  BP 132/72    Pulse 82    Ht _0  (1.676 m)    Wt 124 lb 9.6 oz (56.5 kg)  SpO2 95%    BMI 20.11 kg/m     Wt Readings from Last 3 Encounters:  05/09/19 124 lb 9.6 oz (56.5 kg)  01/17/19 129 lb (58.5 kg)  08/22/18 131 lb (59.4 kg)     Physical Exam  Constitutional: She is oriented to person, place, and time. No distress.  HENT:  Head: Normocephalic and atraumatic.  Eyes: No scleral icterus.  Neck: No JVD present. No thyromegaly present.  Cardiovascular: Normal rate, regular rhythm and normal heart sounds.  No murmur heard. Pulmonary/Chest: Effort normal. She has decreased breath sounds. She has no wheezes. She has no rales.  Abdominal: Soft. There is no hepatomegaly.  Musculoskeletal:        General: No edema.  Lymphadenopathy:    She has no cervical adenopathy.  Neurological: She is alert and oriented to person, place, and time.  Skin: Skin is warm and dry.  Psychiatric: She has a normal mood and affect.    ASSESSMENT & PLAN:    1. Coronary artery disease with angina pectoris [facility]) History of multiple myocardial infarctions and PCI's in the past.  Her last myocardial infarction was in December 2018.  She had an occluded distal apical PDA.  Balloon angioplasty was unsuccessful.  She has been intolerant to many medications in the past.  She is unable to tolerate beta-blockers.  She is not on calcium channel blocker therapy secondary to systolic heart failure.  She does note some episodes of chest discomfort.  I have recommended maximizing her medical  therapy instead of proceeding with cardiac catheterization given her recurrent lung cancer and significant comorbid illnesses. She agrees that we should try to avoid proceeding with invasive evaluation.  I have recommended increasing her isosorbide to 45 mg daily.  However, she prefers to continue her current dose for now.  I advised her that she can increase this on her own if she notices that her symptoms are progressing.  Continue aspirin, clopidogrel, simvastatin.  Follow-up with [name] or me in 6 months.  2. Chronic systolic heart failure (HCC) EF 30-35 by echocardiogram August 2019.  She has been intolerant to beta-blockers, ACE inhibitor's.  Continue spironolactone, isosorbide.  Volume status appears stable.  Continue current dose of furosemide.  3. PVC's (premature ventricular contractions) Holter monitor in the past with 22% PVC burden.  She could not tolerate beta-blocker therapy or mexiletine.  4. Hyperlipidemia, unspecified hyperlipidemia type LDL optimal on most recent lab work.  Continue current Rx.    5. 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  She has planned to pursue a conservative approach.    Dispo:  Return in about 6 months (around 11/07/2019) for Routine Follow Up, w/ [name], or Richardson Dopp, PA-C, (virtual or in-person).   Medication Adjustments/Labs and Tests Ordered: Current medicines are reviewed at length with the patient today.  Concerns regarding medicines are outlined above.  Tests Ordered: Orders Placed This Encounter  Procedures   EKG 12-Lead   Medication Changes: No orders of the defined types were placed in this encounter.   Signed, Richardson Dopp, PA-C  05/09/2019 12:31 PM    [facility] [place], [place], [place]  [address] Phone: [phone]; Fax: [phone]

## 2019-05-16 ENCOUNTER — Other Ambulatory Visit: Payer: Self-pay | Admitting: Cardiovascular Disease

## 2019-05-17 ENCOUNTER — Telehealth: Payer: Self-pay | Admitting: Emergency Medicine

## 2019-05-17 MED ORDER — IPRATROPIUM-ALBUTEROL 0.5-2.5 (3) MG/3ML IN SOLN: RESPIRATORY_TRACT | 5 refills | Status: AC

## 2019-05-17 NOTE — Telephone Encounter (Signed)
Spoke with the pt  Rx for Duoneb sent to pharm  Nothing further needed

## 2019-05-24 DIAGNOSIS — Z961 Presence of intraocular lens: Secondary | ICD-10-CM | POA: Diagnosis not present

## 2019-05-24 DIAGNOSIS — Z947 Corneal transplant status: Secondary | ICD-10-CM | POA: Diagnosis not present

## 2019-05-24 DIAGNOSIS — H40003 Preglaucoma, unspecified, bilateral: Secondary | ICD-10-CM | POA: Diagnosis not present

## 2019-05-24 DIAGNOSIS — H1132 Conjunctival hemorrhage, left eye: Secondary | ICD-10-CM | POA: Diagnosis not present

## 2019-05-29 DIAGNOSIS — R911 Solitary pulmonary nodule: Secondary | ICD-10-CM | POA: Diagnosis not present

## 2019-05-29 DIAGNOSIS — E785 Hyperlipidemia, unspecified: Secondary | ICD-10-CM | POA: Diagnosis not present

## 2019-05-29 DIAGNOSIS — R269 Unspecified abnormalities of gait and mobility: Secondary | ICD-10-CM | POA: Diagnosis not present

## 2019-05-29 DIAGNOSIS — J449 Chronic obstructive pulmonary disease, unspecified: Secondary | ICD-10-CM | POA: Diagnosis not present

## 2019-06-15 ENCOUNTER — Other Ambulatory Visit: Payer: Self-pay | Admitting: Cardiovascular Disease

## 2019-06-26 ENCOUNTER — Telehealth: Payer: Self-pay | Admitting: Emergency Medicine

## 2019-06-26 MED ORDER — PANTOPRAZOLE SODIUM 40 MG PO TBEC: 40.0000 mg | DELAYED_RELEASE_TABLET | Freq: Two times a day (BID) | ORAL | 6 refills | Status: AC

## 2019-06-26 NOTE — Telephone Encounter (Signed)
Call returned to patient, confirmed DOB, pharmacy, and medication. Refill sent. Voiced understanding.   Nothing further needed at this time.

## 2019-06-28 ENCOUNTER — Telehealth: Payer: Self-pay | Admitting: Emergency Medicine

## 2019-06-28 DIAGNOSIS — R918 Other nonspecific abnormal finding of lung field: Secondary | ICD-10-CM

## 2019-06-28 NOTE — Telephone Encounter (Signed)
Spoke with patient.  She is concerned with increasing covid numbers about coming to office. I told her we can change to a mychart or tele visit. She can do mychart but not until 5pm , so I saw in the appt notes she needs a CXR.   Dr. Lamonte Sakai would you please advise if patient can do visit via telephone or does she need to come into office for a chest xray and OV?

## 2019-06-29 ENCOUNTER — Ambulatory Visit: Payer: PPO

## 2019-06-29 NOTE — Telephone Encounter (Signed)
Pt returning call.  [phone]

## 2019-06-29 NOTE — Telephone Encounter (Signed)
I am OK with changing her December appt to a tele-visit whenever she is free. If she would like to go ahead and come in to get a CXR then we could review the results at that visit.

## 2019-06-29 NOTE — Telephone Encounter (Signed)
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

## 2019-06-29 NOTE — Telephone Encounter (Signed)
LMTCB

## 2019-07-04 ENCOUNTER — Encounter: Payer: Self-pay | Admitting: Emergency Medicine

## 2019-07-04 ENCOUNTER — Ambulatory Visit (INDEPENDENT_AMBULATORY_CARE_PROVIDER_SITE_OTHER): Payer: PPO

## 2019-07-04 ENCOUNTER — Other Ambulatory Visit: Payer: Self-pay

## 2019-07-04 ENCOUNTER — Ambulatory Visit: Payer: PPO | Admitting: Emergency Medicine

## 2019-07-04 VITALS — BP 138/68 | HR 85 | Temp 98.5°F | Ht 66.0 in | Wt 127.4 lb

## 2019-07-04 DIAGNOSIS — J449 Chronic obstructive pulmonary disease, unspecified: Secondary | ICD-10-CM | POA: Diagnosis not present

## 2019-07-04 DIAGNOSIS — J440 Chronic obstructive pulmonary disease with acute lower respiratory infection: Secondary | ICD-10-CM | POA: Insufficient documentation

## 2019-07-04 DIAGNOSIS — J301 Allergic rhinitis due to pollen: Secondary | ICD-10-CM | POA: Diagnosis not present

## 2019-07-04 DIAGNOSIS — R918 Other nonspecific abnormal finding of lung field: Secondary | ICD-10-CM | POA: Diagnosis not present

## 2019-07-04 DIAGNOSIS — J209 Acute bronchitis, unspecified: Secondary | ICD-10-CM | POA: Insufficient documentation

## 2019-07-04 DIAGNOSIS — K219 Gastro-esophageal reflux disease without esophagitis: Secondary | ICD-10-CM | POA: Diagnosis not present

## 2019-07-04 DIAGNOSIS — J439 Emphysema, unspecified: Secondary | ICD-10-CM | POA: Diagnosis not present

## 2019-07-04 MED ORDER — ALBUTEROL SULFATE HFA 108 (90 BASE) MCG/ACT IN AERS: 2.0000 | INHALATION_SPRAY | RESPIRATORY_TRACT | 5 refills | Status: AC | PRN

## 2019-07-04 MED ORDER — DOXYCYCLINE HYCLATE 100 MG PO TABS: 100.0000 mg | ORAL_TABLET | Freq: Two times a day (BID) | ORAL | 0 refills | Status: AC

## 2019-07-04 NOTE — Assessment & Plan Note (Signed)
Recent URI symptoms and now with brown mucus.  She is not wheezing and I believe we can defer steroids.  Treat with doxycycline 100 twice daily

## 2019-07-04 NOTE — Assessment & Plan Note (Signed)
With an enlarging left lower lobe nodule, slightly bigger on chest x-ray today.  We have discussed and she does not want any intervention.  We are following principally for prognostic reasons.  We will plan to repeat her chest x-ray in 6 months, sooner if she develops any new symptoms.

## 2019-07-04 NOTE — Assessment & Plan Note (Signed)
  Please continue your fluticasone nasal spray, loratadine

## 2019-07-04 NOTE — Patient Instructions (Addendum)
Please take doxycycline 100 mg twice a day until completely gone (7 days). Please continue your budesonide nebulizer treatment twice a day on a schedule. Please continue DuoNeb 4 times a day on a schedule.  You can also take a treatment at night if you wake up with shortness of breath. Keep your albuterol available to use 2 puffs if needed for shortness of breath, chest tightness, wheezing and your nebulizers are not available. Flu shot and pneumonia shot up-to-date Chest x-ray today showed some slight increase in the size of your left lower lobe pulmonary nodule.  We will continue to follow this.  Plan for a repeat chest x-ray in 6 months Please continue your fluticasone nasal spray, loratadine Please continue your pantoprazole as you have been taking it. Follow with Dr Lamonte Sakai in 3 months in office or by phone visit, or sooner if you have any problems.

## 2019-07-04 NOTE — Assessment & Plan Note (Signed)
Continue pantoprazole as ordered

## 2019-07-04 NOTE — Assessment & Plan Note (Signed)
Increased dyspnea recently, seems to coincide with her current acute bronchitis.  No wheezing.  She has had to save a dose of DuoNeb because she feels that she is going to need it at night, has been waking with nocturnal dyspnea.  Asked her to continue the DuoNeb 4 times daily, use an extra albuterol neb if she wakes up at night.  Refill her albuterol HFA.  Continue the scheduled budesonide   Please continue your budesonide nebulizer treatment twice a day on a schedule. Please continue DuoNeb 4 times a day on a schedule.  You can also take a treatment at night if you wake up with shortness of breath. Keep your albuterol available to use 2 puffs if needed for shortness of breath, chest tightness, wheezing and your nebulizers are not available. Flu shot and pneumonia shot up-to-date Follow with Dr Lamonte Sakai in 3 months in office or by phone visit, or sooner if you have any problems.

## 2019-07-04 NOTE — Progress Notes (Signed)
Subjective:    Patient ID: [name], female    DOB: 07-21-1941, 78 y.o.   MRN: [id number]  HPI  ROV 04/20/2019 --follow-up visit for 78 year old woman with COPD, chronic cough in the setting of GERD, chronic rhinitis.  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  We have been following pulmonary nodular disease with serial CT scans of the chest.  Most recent scan was in 07/18/2018 - her LLL nodule is slowly enlarging.  At her last visit we decided to defer a 58-month follow-up.  We are attempting to balance the cost versus the value of the information that it might provide. She is still convinced that she would not want treatment as long as she does not have symptoms.   Her current medical regimen includes Pulmicort twice daily, duo nebs every 6 hours and as needed. Has albuterol but very rarely uses.  She is on loratadine, felt that she benefited from fluticasone nasal spray - she has some epistaxis on the L side so only uses it on the R.  Also on Protonix 40 mg twice daily She feels that her breathing is overall stable, although she is having trouble tolerating facemask. She is SOB in the am, gets some relief when she does her first DuoNeb in the am. She was treated with doxy in February, then with steroids / abx  in late May for a possible AE-COPD. Using O2 at 2L/min at all times, pulsed w exertion.   ROV 07/04/2019 --[name] is 13 and has a history of COPD, chronic cough.  She also has a history of adenocarcinoma status post right lower lobe superior segmentectomy.  We have been following slowly enlarging pulmonary nodules, especially one in the left lower lobe.  She has indicated that she would not want treatment for any recurrent disease so we have been conservative with imaging.  We decided to follow her this month with a chest x-ray, performed today, shows what appears to be stable right lower lobe scar.   The LLL nodule is ion the mid  lung and is probably a few mm larger than on prior CXR.  She has been experiencing exertional dyspnea, associated with some anxiety.  She has had to stop to rest with ambulation here in the office.  Currently managed on budesonide nebs twice daily, DuoNeb 4 times daily on a schedule, has albuterol that she can use as needed.  She has been dyspneic at night - wakes her from sleep for about the last week assoc with URI sx. She has not had any increased wheeze, but has has started coughing up brown mucous. She needs a new albuterol HFA She remains on fluticasone, loratadine, pantoprazole.   Review of Systems     As per history of present illness    Objective:   Physical Exam Vitals:   07/04/19 0953  BP: 138/68  Pulse: 85  Temp: 98.5 F (36.9 C)  TempSrc: Temporal  SpO2: 94%  Weight: 127 lb 6.4 oz (57.8 kg)  Height: 5\' 6"  (1.676 m)    Gen: elderly woman, no resp distress, a bit anxious  ENT: No lesions,  mouth clear,  oropharynx clear, no postnasal drip, strong voice  Neck: No JVD, no stridor  Lungs: normal resp pattern, no wheeze today  Cardiovascular: RRR, heart sounds normal, no murmur or gallops, no peripheral edema.   Musculoskeletal: No deformities, no cyanosis or clubbing  Neuro: alert, non focal  Skin: Warm,  no lesions or rashes      Assessment & Plan:  Acute bronchitis with COPD ([place]) Recent URI symptoms and now with brown mucus.  She is not wheezing and I believe we can defer steroids.  Treat with doxycycline 100 twice daily  COPD (chronic obstructive pulmonary disease) (HCC) Increased dyspnea recently, seems to coincide with her current acute bronchitis.  No wheezing.  She has had to save a dose of DuoNeb because she feels that she is going to need it at night, has been waking with nocturnal dyspnea.  Asked her to continue the DuoNeb 4 times daily, use an extra albuterol neb if she wakes up at night.  Refill her albuterol HFA.  Continue the scheduled  budesonide   Please continue your budesonide nebulizer treatment twice a day on a schedule. Please continue DuoNeb 4 times a day on a schedule.  You can also take a treatment at night if you wake up with shortness of breath. Keep your albuterol available to use 2 puffs if needed for shortness of breath, chest tightness, wheezing and your nebulizers are not available. Flu shot and pneumonia shot up-to-date Follow with Dr Lamonte Sakai in 3 months in office or by phone visit, or sooner if you have any problems.  Allergic rhinitis  Please continue your fluticasone nasal spray, loratadine   GERD Continue pantoprazole as ordered  Pulmonary nodules With an enlarging left lower lobe nodule, slightly bigger on chest x-ray today.  We have discussed and she does not want any intervention.  We are following principally for prognostic reasons.  We will plan to repeat her chest x-ray in 6 months, sooner if she develops any new symptoms.  Baltazar Apo, MD, PhD 07/04/2019, 10:14 AM [facility] [phone] or if no answer [phone]

## 2019-07-07 ENCOUNTER — Telehealth: Payer: Self-pay | Admitting: *Deleted

## 2019-07-07 NOTE — Telephone Encounter (Signed)
Copied from [place] [phone] [id number]. Topic: General - Other >> Jul 07, 2019 10:31 AM Keene Breath wrote: Reason for CRM: Patient would like the nurse to call her regarding the COVID vaccine.  She wants to know if it is safe considering she has a lot of allergies.  Please call to discuss at [phone]

## 2019-07-07 NOTE — Telephone Encounter (Signed)
Explained to patient Vaccine is not available to the general public at this time, and not at this office , That she should schedule an appointment to establish with another provider that [name] is taking new patients. Patient stated she will schedule at her [facility] appointment.

## 2019-07-18 ENCOUNTER — Ambulatory Visit: Payer: PPO

## 2019-07-22 DIAGNOSIS — E785 Hyperlipidemia, unspecified: Secondary | ICD-10-CM | POA: Diagnosis not present

## 2019-07-22 DIAGNOSIS — R911 Solitary pulmonary nodule: Secondary | ICD-10-CM | POA: Diagnosis not present

## 2019-07-22 DIAGNOSIS — J449 Chronic obstructive pulmonary disease, unspecified: Secondary | ICD-10-CM | POA: Diagnosis not present

## 2019-07-22 DIAGNOSIS — R269 Unspecified abnormalities of gait and mobility: Secondary | ICD-10-CM | POA: Diagnosis not present

## 2019-07-24 ENCOUNTER — Telehealth: Payer: Self-pay | Admitting: Emergency Medicine

## 2019-07-24 MED ORDER — ALBUTEROL SULFATE (2.5 MG/3ML) 0.083% IN NEBU: 2.5000 mg | INHALATION_SOLUTION | RESPIRATORY_TRACT | 5 refills | Status: AC | PRN

## 2019-07-24 NOTE — Telephone Encounter (Signed)
Spoke with [name] to verify the pharmacy  Rx was sent to [facility] for albuterol nebs  Nothing further needed

## 2019-07-27 ENCOUNTER — Encounter (HOSPITAL_COMMUNITY): Payer: Self-pay | Admitting: Emergency Medicine

## 2019-07-27 ENCOUNTER — Emergency Department (HOSPITAL_COMMUNITY): Payer: PPO

## 2019-07-27 ENCOUNTER — Other Ambulatory Visit: Payer: Self-pay

## 2019-07-27 ENCOUNTER — Inpatient Hospital Stay (HOSPITAL_COMMUNITY)
Admission: EM | Admit: 2019-07-27 | Discharge: 2019-08-28 | DRG: 199 | Disposition: E | Payer: PPO | Attending: Internal Medicine | Admitting: Internal Medicine

## 2019-07-27 DIAGNOSIS — R0602 Shortness of breath: Secondary | ICD-10-CM

## 2019-07-27 DIAGNOSIS — Z87891 Personal history of nicotine dependence: Secondary | ICD-10-CM

## 2019-07-27 DIAGNOSIS — N1832 Chronic kidney disease, stage 3b: Secondary | ICD-10-CM | POA: Diagnosis present

## 2019-07-27 DIAGNOSIS — Z9981 Dependence on supplemental oxygen: Secondary | ICD-10-CM

## 2019-07-27 DIAGNOSIS — E872 Acidosis: Secondary | ICD-10-CM | POA: Diagnosis not present

## 2019-07-27 DIAGNOSIS — I255 Ischemic cardiomyopathy: Secondary | ICD-10-CM | POA: Diagnosis present

## 2019-07-27 DIAGNOSIS — R0689 Other abnormalities of breathing: Secondary | ICD-10-CM | POA: Diagnosis not present

## 2019-07-27 DIAGNOSIS — C3431 Malignant neoplasm of lower lobe, right bronchus or lung: Secondary | ICD-10-CM | POA: Diagnosis present

## 2019-07-27 DIAGNOSIS — I130 Hypertensive heart and chronic kidney disease with heart failure and stage 1 through stage 4 chronic kidney disease, or unspecified chronic kidney disease: Secondary | ICD-10-CM | POA: Diagnosis not present

## 2019-07-27 DIAGNOSIS — Z7902 Long term (current) use of antithrombotics/antiplatelets: Secondary | ICD-10-CM

## 2019-07-27 DIAGNOSIS — I480 Paroxysmal atrial fibrillation: Secondary | ICD-10-CM | POA: Diagnosis present

## 2019-07-27 DIAGNOSIS — I5043 Acute on chronic combined systolic (congestive) and diastolic (congestive) heart failure: Secondary | ICD-10-CM | POA: Diagnosis not present

## 2019-07-27 DIAGNOSIS — Z20828 Contact with and (suspected) exposure to other viral communicable diseases: Secondary | ICD-10-CM | POA: Diagnosis not present

## 2019-07-27 DIAGNOSIS — M810 Age-related osteoporosis without current pathological fracture: Secondary | ICD-10-CM | POA: Diagnosis present

## 2019-07-27 DIAGNOSIS — Z955 Presence of coronary angioplasty implant and graft: Secondary | ICD-10-CM

## 2019-07-27 DIAGNOSIS — J9621 Acute and chronic respiratory failure with hypoxia: Secondary | ICD-10-CM | POA: Diagnosis present

## 2019-07-27 DIAGNOSIS — R0902 Hypoxemia: Secondary | ICD-10-CM | POA: Diagnosis not present

## 2019-07-27 DIAGNOSIS — E785 Hyperlipidemia, unspecified: Secondary | ICD-10-CM | POA: Diagnosis not present

## 2019-07-27 DIAGNOSIS — J441 Chronic obstructive pulmonary disease with (acute) exacerbation: Secondary | ICD-10-CM | POA: Diagnosis not present

## 2019-07-27 DIAGNOSIS — Z823 Family history of stroke: Secondary | ICD-10-CM

## 2019-07-27 DIAGNOSIS — J9383 Other pneumothorax: Secondary | ICD-10-CM

## 2019-07-27 DIAGNOSIS — G9349 Other encephalopathy: Secondary | ICD-10-CM | POA: Diagnosis not present

## 2019-07-27 DIAGNOSIS — I10 Essential (primary) hypertension: Secondary | ICD-10-CM | POA: Diagnosis not present

## 2019-07-27 DIAGNOSIS — Z66 Do not resuscitate: Secondary | ICD-10-CM

## 2019-07-27 DIAGNOSIS — J309 Allergic rhinitis, unspecified: Secondary | ICD-10-CM | POA: Diagnosis present

## 2019-07-27 DIAGNOSIS — R627 Adult failure to thrive: Secondary | ICD-10-CM | POA: Diagnosis present

## 2019-07-27 DIAGNOSIS — Z85118 Personal history of other malignant neoplasm of bronchus and lung: Secondary | ICD-10-CM

## 2019-07-27 DIAGNOSIS — Z90710 Acquired absence of both cervix and uterus: Secondary | ICD-10-CM

## 2019-07-27 DIAGNOSIS — R918 Other nonspecific abnormal finding of lung field: Secondary | ICD-10-CM | POA: Diagnosis not present

## 2019-07-27 DIAGNOSIS — J80 Acute respiratory distress syndrome: Secondary | ICD-10-CM | POA: Diagnosis not present

## 2019-07-27 DIAGNOSIS — Z8249 Family history of ischemic heart disease and other diseases of the circulatory system: Secondary | ICD-10-CM

## 2019-07-27 DIAGNOSIS — Z20822 Contact with and (suspected) exposure to covid-19: Secondary | ICD-10-CM | POA: Diagnosis not present

## 2019-07-27 DIAGNOSIS — H3530 Unspecified macular degeneration: Secondary | ICD-10-CM | POA: Diagnosis present

## 2019-07-27 DIAGNOSIS — K219 Gastro-esophageal reflux disease without esophagitis: Secondary | ICD-10-CM | POA: Diagnosis not present

## 2019-07-27 DIAGNOSIS — Z8673 Personal history of transient ischemic attack (TIA), and cerebral infarction without residual deficits: Secondary | ICD-10-CM

## 2019-07-27 DIAGNOSIS — I4891 Unspecified atrial fibrillation: Secondary | ICD-10-CM | POA: Diagnosis not present

## 2019-07-27 DIAGNOSIS — Z79899 Other long term (current) drug therapy: Secondary | ICD-10-CM

## 2019-07-27 DIAGNOSIS — J9311 Primary spontaneous pneumothorax: Secondary | ICD-10-CM | POA: Diagnosis not present

## 2019-07-27 DIAGNOSIS — J939 Pneumothorax, unspecified: Secondary | ICD-10-CM | POA: Diagnosis present

## 2019-07-27 DIAGNOSIS — Z789 Other specified health status: Secondary | ICD-10-CM

## 2019-07-27 DIAGNOSIS — Z515 Encounter for palliative care: Secondary | ICD-10-CM | POA: Diagnosis not present

## 2019-07-27 DIAGNOSIS — Z888 Allergy status to other drugs, medicaments and biological substances status: Secondary | ICD-10-CM

## 2019-07-27 DIAGNOSIS — Z902 Acquired absence of lung [part of]: Secondary | ICD-10-CM

## 2019-07-27 DIAGNOSIS — Z4682 Encounter for fitting and adjustment of non-vascular catheter: Secondary | ICD-10-CM | POA: Diagnosis not present

## 2019-07-27 DIAGNOSIS — I252 Old myocardial infarction: Secondary | ICD-10-CM

## 2019-07-27 DIAGNOSIS — N183 Chronic kidney disease, stage 3 unspecified: Secondary | ICD-10-CM | POA: Diagnosis present

## 2019-07-27 DIAGNOSIS — Z885 Allergy status to narcotic agent status: Secondary | ICD-10-CM

## 2019-07-27 DIAGNOSIS — J9622 Acute and chronic respiratory failure with hypercapnia: Secondary | ICD-10-CM | POA: Diagnosis not present

## 2019-07-27 DIAGNOSIS — Z9079 Acquired absence of other genital organ(s): Secondary | ICD-10-CM

## 2019-07-27 DIAGNOSIS — J189 Pneumonia, unspecified organism: Secondary | ICD-10-CM | POA: Diagnosis not present

## 2019-07-27 DIAGNOSIS — I2510 Atherosclerotic heart disease of native coronary artery without angina pectoris: Secondary | ICD-10-CM | POA: Diagnosis present

## 2019-07-27 DIAGNOSIS — Z7982 Long term (current) use of aspirin: Secondary | ICD-10-CM

## 2019-07-27 DIAGNOSIS — Z7951 Long term (current) use of inhaled steroids: Secondary | ICD-10-CM

## 2019-07-27 LAB — CBC WITH DIFFERENTIAL/PLATELET
Abs Immature Granulocytes: 0.06 10*3/uL (ref 0.00–0.07)
Basophils Absolute: 0.1 10*3/uL (ref 0.0–0.1)
Basophils Relative: 1 %
Eosinophils Absolute: 0.2 10*3/uL (ref 0.0–0.5)
Eosinophils Relative: 2 %
HCT: 45.1 % (ref 36.0–46.0)
Hemoglobin: 13.5 g/dL (ref 12.0–15.0)
Immature Granulocytes: 1 %
Lymphocytes Relative: 26 %
Lymphs Abs: 2.9 10*3/uL (ref 0.7–4.0)
MCH: 30.3 pg (ref 26.0–34.0)
MCHC: 29.9 g/dL — ABNORMAL LOW (ref 30.0–36.0)
MCV: 101.3 fL — ABNORMAL HIGH (ref 80.0–100.0)
Monocytes Absolute: 0.5 10*3/uL (ref 0.1–1.0)
Monocytes Relative: 5 %
Neutro Abs: 7.3 10*3/uL (ref 1.7–7.7)
Neutrophils Relative %: 65 %
Platelets: 223 10*3/uL (ref 150–400)
RBC: 4.45 MIL/uL (ref 3.87–5.11)
RDW: 14.4 % (ref 11.5–15.5)
WBC: 11 10*3/uL — ABNORMAL HIGH (ref 4.0–10.5)
nRBC: 0 % (ref 0.0–0.2)

## 2019-07-27 LAB — COMPREHENSIVE METABOLIC PANEL
ALT: 33 U/L (ref 0–44)
AST: 54 U/L — ABNORMAL HIGH (ref 15–41)
Albumin: 3.4 g/dL — ABNORMAL LOW (ref 3.5–5.0)
Alkaline Phosphatase: 107 U/L (ref 38–126)
Anion gap: 15 (ref 5–15)
BUN: 21 mg/dL (ref 8–23)
CO2: 23 mmol/L (ref 22–32)
Calcium: 9.7 mg/dL (ref 8.9–10.3)
Chloride: 97 mmol/L — ABNORMAL LOW (ref 98–111)
Creatinine, Ser: 1.43 mg/dL — ABNORMAL HIGH (ref 0.44–1.00)
GFR calc Af Amer: 41 mL/min — ABNORMAL LOW (ref >60)
GFR calc non Af Amer: 35 mL/min — ABNORMAL LOW (ref >60)
Glucose, Bld: 245 mg/dL — ABNORMAL HIGH (ref 70–99)
Potassium: 4.7 mmol/L (ref 3.5–5.1)
Sodium: 135 mmol/L (ref 135–145)
Total Bilirubin: 1.1 mg/dL (ref 0.3–1.2)
Total Protein: 6.6 g/dL (ref 6.5–8.1)

## 2019-07-27 LAB — POCT I-STAT 7, (LYTES, BLD GAS, ICA,H+H)
Acid-base deficit: 5 mmol/L — ABNORMAL HIGH (ref 0.0–2.0)
Bicarbonate: 22.6 mmol/L (ref 20.0–28.0)
Calcium, Ion: 1.24 mmol/L (ref 1.15–1.40)
HCT: 42 % (ref 36.0–46.0)
Hemoglobin: 14.3 g/dL (ref 12.0–15.0)
O2 Saturation: 94 %
Patient temperature: 99.9
Potassium: 4.7 mmol/L (ref 3.5–5.1)
Sodium: 129 mmol/L — ABNORMAL LOW (ref 135–145)
TCO2: 24 mmol/L (ref 22–32)
pCO2 arterial: 55 mmHg — ABNORMAL HIGH (ref 32.0–48.0)
pH, Arterial: 7.226 — ABNORMAL LOW (ref 7.350–7.450)
pO2, Arterial: 87 mmHg (ref 83.0–108.0)

## 2019-07-27 LAB — RESPIRATORY PANEL BY RT PCR (FLU A&B, COVID)
Influenza A by PCR: NEGATIVE
Influenza B by PCR: NEGATIVE
SARS Coronavirus 2 by RT PCR: NEGATIVE

## 2019-07-27 LAB — TROPONIN I (HIGH SENSITIVITY): Troponin I (High Sensitivity): 67 ng/L — ABNORMAL HIGH (ref <18)

## 2019-07-27 LAB — POC SARS CORONAVIRUS 2 AG -  ED: SARS Coronavirus 2 Ag: NEGATIVE

## 2019-07-27 LAB — LACTIC ACID, PLASMA
Lactic Acid, Venous: 5.9 mmol/L (ref 0.5–1.9)
Lactic Acid, Venous: 5.2 mmol/L (ref 0.5–1.9)

## 2019-07-27 LAB — BRAIN NATRIURETIC PEPTIDE: B Natriuretic Peptide: 804.1 pg/mL — ABNORMAL HIGH (ref 0.0–100.0)

## 2019-07-27 MED ORDER — GLYCOPYRROLATE 0.2 MG/ML IJ SOLN: 0.2000 mg | INTRAMUSCULAR | Status: DC | PRN

## 2019-07-27 MED ORDER — SODIUM CHLORIDE 0.9 % IV SOLN
500.0000 mg | INTRAVENOUS | Status: DC
  Administered 2019-07-27: 09:00:00 500 mg via INTRAVENOUS
  Filled 2019-07-27: qty 500

## 2019-07-27 MED ORDER — ONDANSETRON 4 MG PO TBDP: 4.0000 mg | ORAL_TABLET | Freq: Four times a day (QID) | ORAL | Status: DC | PRN

## 2019-07-27 MED ORDER — HALOPERIDOL LACTATE 5 MG/ML IJ SOLN: 0.5000 mg | INTRAMUSCULAR | Status: DC | PRN

## 2019-07-27 MED ORDER — ALBUTEROL SULFATE (2.5 MG/3ML) 0.083% IN NEBU: 2.5000 mg | INHALATION_SOLUTION | RESPIRATORY_TRACT | Status: DC | PRN

## 2019-07-27 MED ORDER — ONDANSETRON HCL 4 MG/2ML IJ SOLN: 4.0000 mg | Freq: Four times a day (QID) | INTRAMUSCULAR | Status: DC | PRN

## 2019-07-27 MED ORDER — MORPHINE SULFATE (PF) 4 MG/ML IV SOLN
INTRAVENOUS | Status: AC
  Administered 2019-07-27: 10:00:00 4 mg
  Filled 2019-07-27: qty 1

## 2019-07-27 MED ORDER — GLYCOPYRROLATE 1 MG PO TABS
1.0000 mg | ORAL_TABLET | ORAL | Status: DC | PRN
  Filled 2019-07-27: qty 1

## 2019-07-27 MED ORDER — MAGNESIUM SULFATE 2 GM/50ML IV SOLN
2.0000 g | Freq: Once | INTRAVENOUS | Status: AC
  Administered 2019-07-27: 07:00:00 2 g via INTRAVENOUS
  Filled 2019-07-27: qty 50

## 2019-07-27 MED ORDER — SODIUM CHLORIDE 0.9 % IV SOLN: Freq: Once | INTRAVENOUS | Status: AC

## 2019-07-27 MED ORDER — SODIUM CHLORIDE 0.9 % IV SOLN
Freq: Once | INTRAVENOUS | Status: AC
  Administered 2019-07-27: 09:00:00 1000 mL via INTRAVENOUS

## 2019-07-27 MED ORDER — POLYVINYL ALCOHOL 1.4 % OP SOLN
1.0000 [drp] | Freq: Four times a day (QID) | OPHTHALMIC | Status: DC | PRN
  Filled 2019-07-27: qty 15

## 2019-07-27 MED ORDER — IPRATROPIUM BROMIDE 0.02 % IN SOLN
0.5000 mg | Freq: Once | RESPIRATORY_TRACT | Status: AC
  Administered 2019-07-27: 07:00:00 0.5 mg via RESPIRATORY_TRACT
  Filled 2019-07-27: qty 2.5

## 2019-07-27 MED ORDER — FUROSEMIDE 10 MG/ML IJ SOLN
40.0000 mg | Freq: Once | INTRAMUSCULAR | Status: AC
  Administered 2019-07-27: 40 mg via INTRAVENOUS
  Filled 2019-07-27: qty 4

## 2019-07-27 MED ORDER — ACETAMINOPHEN 325 MG PO TABS: 650.0000 mg | ORAL_TABLET | Freq: Four times a day (QID) | ORAL | Status: DC | PRN

## 2019-07-27 MED ORDER — ALBUTEROL (5 MG/ML) CONTINUOUS INHALATION SOLN
10.0000 mg/h | INHALATION_SOLUTION | RESPIRATORY_TRACT | Status: DC
  Administered 2019-07-27: 07:00:00 10 mg/h via RESPIRATORY_TRACT
  Filled 2019-07-27: qty 20

## 2019-07-27 MED ORDER — LORAZEPAM 1 MG PO TABS: 1.0000 mg | ORAL_TABLET | ORAL | Status: DC | PRN

## 2019-07-27 MED ORDER — LORAZEPAM 2 MG/ML IJ SOLN
1.0000 mg | Freq: Once | INTRAMUSCULAR | Status: AC
  Administered 2019-07-27: 07:00:00 1 mg via INTRAVENOUS

## 2019-07-27 MED ORDER — SODIUM CHLORIDE 0.9 % IV SOLN: Freq: Once | INTRAVENOUS | Status: DC

## 2019-07-27 MED ORDER — LORAZEPAM 2 MG/ML IJ SOLN: 1.0000 mg | INTRAMUSCULAR | Status: DC | PRN

## 2019-07-27 MED ORDER — HALOPERIDOL 0.5 MG PO TABS: 0.5000 mg | ORAL_TABLET | ORAL | Status: DC | PRN

## 2019-07-27 MED ORDER — SODIUM CHLORIDE 0.9 % IV SOLN
2.0000 g | INTRAVENOUS | Status: DC
  Administered 2019-07-27: 2 g via INTRAVENOUS
  Filled 2019-07-27: qty 20

## 2019-07-27 MED ORDER — MORPHINE SULFATE (PF) 2 MG/ML IV SOLN: 2.0000 mg | INTRAVENOUS | Status: DC | PRN

## 2019-07-27 MED ORDER — LORAZEPAM 2 MG/ML PO CONC: 1.0000 mg | ORAL | Status: DC | PRN

## 2019-07-27 MED ORDER — METHYLPREDNISOLONE SODIUM SUCC 125 MG IJ SOLR
125.0000 mg | Freq: Once | INTRAMUSCULAR | Status: AC
  Administered 2019-07-27: 07:00:00 125 mg via INTRAVENOUS
  Filled 2019-07-27: qty 2

## 2019-07-27 MED ORDER — DIPHENHYDRAMINE HCL 50 MG/ML IJ SOLN: 12.5000 mg | INTRAMUSCULAR | Status: DC | PRN

## 2019-07-27 MED ORDER — BIOTENE DRY MOUTH MT LIQD: 15.0000 mL | OROMUCOSAL | Status: DC | PRN

## 2019-07-27 MED ORDER — MORPHINE 100MG IN NS 100ML (1MG/ML) PREMIX INFUSION
0.5000 mg/h | INTRAVENOUS | Status: DC
  Administered 2019-07-27: 11:00:00 1 mg/h via INTRAVENOUS
  Administered 2019-07-29: 2 mg/h via INTRAVENOUS
  Filled 2019-07-27 (×2): qty 100

## 2019-07-27 MED ORDER — HALOPERIDOL LACTATE 2 MG/ML PO CONC
0.5000 mg | ORAL | Status: DC | PRN
  Filled 2019-07-27: qty 0.3

## 2019-07-27 MED ORDER — ACETAMINOPHEN 650 MG RE SUPP: 650.0000 mg | Freq: Four times a day (QID) | RECTAL | Status: DC | PRN

## 2019-07-27 MED ORDER — LORAZEPAM 2 MG/ML IJ SOLN
INTRAMUSCULAR | Status: AC
  Filled 2019-07-27: qty 1

## 2019-07-27 NOTE — ED Triage Notes (Signed)
Patient arrived with EMS from home reports worsening SOB with wheezing and chest congestion onset yesterday , she received Amiodarone 150 mg IV by EMS - heart rate improved from 180/min to 130/min at arrival history of Afib/COPD/CHF .

## 2019-07-27 NOTE — ED Notes (Signed)
Dr. Leonette Monarch at bedside inserted #14 CT right upper chest, connected to 20cm suction. Patient is alert  Non-verbal.

## 2019-07-27 NOTE — ED Notes (Signed)
Oral care given

## 2019-07-27 NOTE — ED Notes (Signed)
ED TO INPATIENT HANDOFF REPORT  ED Nurse Name and Phone #: [phone]  S Name/Age/Gender [name] 78 y.o. female Room/Bed: RESUSC/RESUSC  Code Status   Code Status: DNR  Home/SNF/Other Home Patient oriented to: self, place, time and situation Is this baseline? Yes   Triage Complete: Triage complete  Chief Complaint Pneumothorax [J93.9]  Triage Note Patient arrived with EMS from home reports worsening SOB with wheezing and chest congestion onset yesterday , she received Amiodarone 150 mg IV by EMS - heart rate improved from 180/min to 130/min at arrival history of Afib/COPD/CHF .    Allergies Allergies  Allergen Reactions  . Actonel [Risedronate Sodium] Other (See Comments)    Headache  . Amlodipine Other (See Comments)    Pedal edema  . Brilinta [Ticagrelor] Other (See Comments)    Worsening dyspnea, malaise  . Codeine Rash  . Fosamax [Alendronate Sodium] Other (See Comments)    Unable to tolerate  . Lisinopril Cough  . Pravastatin Other (See Comments)    Constipation.  . Prednisone Other (See Comments)    Shaking (PILLS) Shot is ok    Level of Care/Admitting Diagnosis ED Disposition    ED Disposition Condition [place]: [place] [100100]  Level of Care: Med-Surg [16]  Covid Evaluation: Asymptomatic Screening Protocol (No Symptoms)  Diagnosis: Pneumothorax [892119]  Admitting Physician: Norval Morton [[id number]]  Attending Physician: Norval Morton [[id number]]  Estimated length of stay: past midnight tomorrow  Certification:: I certify this patient will need inpatient services for at least 2 midnights       B Medical/Surgery History Past Medical History:  Diagnosis Date  . Arthritis   . CAD (coronary artery disease) 03/2009   s/p MI  . Chronic combined systolic and diastolic heart failure ([place]) 06/17/2009   Qualifier: Diagnosis of  By: Burt Knack, MD, [place]burn Pert   . CKD (chronic kidney disease) stage  3, GFR 30-59 ml/min   . Emphysema/COPD ([place])   . GERD (gastroesophageal reflux disease)   . HCAP (healthcare-associated pneumonia) 03/19/2016  . Helicobacter pylori gastritis 10/2007   treated  . History of CVA (cerebrovascular accident) without residual deficits 01/2012, 06/2012   R hemorrhagic MCA 01/2012 with remote lacunar infarct L putamen and IC, rpt 06/2012 acute multifocal R MCA infarct with remote hemorrhagic strokes affecting L basal ganglia and periventricular white matter, full recovery  . HLD (hyperlipidemia)   . HTN (hypertension)   . Ischemic cardiomyopathy 02/27/2014  . Lung cancer ([place]) dx'd 07/2006   Lung CA, s/p resection, followed by Dr Earlie Server  . Macular degeneration   . Myocardial infarction [facility]) 03/2009   Acute myocardial infarction 2010 - treated with BMS of LCx. LVEF 50% with subsequent CHF  . NSTEMI (non-ST elevated myocardial infarction) ([place])   . Osteoporosis 09/2013   T -3.6 forearm 09/2013, T -4.5 forearm 04/2015  . Pre-diabetes   . Syncope 02/24/2018   Past Surgical History:  Procedure Laterality Date  . BREAST BIOPSY Left   . CARDIAC CATHETERIZATION N/A 02/24/2016   Procedure: Left Heart Cath and Coronary Angiography;  Surgeon: Belva Crome, MD;  Location: [place] [facility];  Service: Cardiovascular;  Laterality: N/A;  . CARDIAC CATHETERIZATION N/A 07/09/2016   Procedure: Left Heart Cath and Coronary Angiography;  Surgeon: Peter M Martinique, MD;  Location: [place] [facility];  Service: Cardiovascular;  Laterality: N/A;  . CARDIAC CATHETERIZATION N/A 07/09/2016   Procedure: Coronary Stent Intervention;  Surgeon: Ander Slade  Martinique, MD;  Location: [place] [facility];  Service: Cardiovascular;  Laterality: N/A;  . CATARACT EXTRACTION Bilateral   . CORONARY STENT INTERVENTION N/A 07/06/2017   Procedure: CORONARY STENT INTERVENTION;  Surgeon: Lorretta Harp, MD;  Location: [place] [facility];  Service: Cardiovascular;  Laterality: N/A;  . CORONARY STENT PLACEMENT   06/2016   DES to mid LAD  . CORONARY/GRAFT ACUTE MI REVASCULARIZATION N/A 07/06/2017   Procedure: Coronary/Graft Acute MI Revascularization;  Surgeon: Lorretta Harp, MD;  Location: [place] [facility];  Service: Cardiovascular;  Laterality: N/A;  . EYE SURGERY    . LEFT HEART CATH AND CORONARY ANGIOGRAPHY N/A 07/06/2017   Procedure: LEFT HEART CATH AND CORONARY ANGIOGRAPHY;  Surgeon: Lorretta Harp, MD;  Location: [facility] [facility];  Service: Cardiovascular;  Laterality: N/A;  . LUNG REMOVAL, PARTIAL Right 2008  . PARTIAL HYSTERECTOMY  1986   irregular periods, ovaries remain     A IV Location/Drains/Wounds Patient Lines/Drains/Airways Status   Active Line/Drains/Airways    Name:   Placement date:   Placement time:   Site:   Days:   Peripheral IV 07/15/2019 Right Hand   07/20/2019    --    Hand   less than 1   Peripheral IV 07/08/2019 Left Forearm   07/25/2019    --    Forearm   less than 1   Peripheral IV 06/30/2019 Right Forearm   07/26/2019    0915    Forearm   less than 1   Pressure Injury 02/24/18 Stage I -  Intact skin with non-blanchable redness of a localized area usually over a bony prominence.   02/24/18    1700     518          Intake/Output Last 24 hours No intake or output data in the 24 hours ending 07/22/2019 1327  Labs/Imaging Results for orders placed or performed during the hospital encounter of 07/09/2019 (from the past 48 hour(s))  CBC w/ Diff     Status: Abnormal   Collection Time: 07/04/2019  6:21 AM  Result Value Ref Range   WBC 11.0 (H) 4.0 - 10.5 K/uL   RBC 4.45 3.87 - 5.11 MIL/uL   Hemoglobin 13.5 12.0 - 15.0 g/dL   HCT 45.1 36.0 - 46.0 %   MCV 101.3 (H) 80.0 - 100.0 fL   MCH 30.3 26.0 - 34.0 pg   MCHC 29.9 (L) 30.0 - 36.0 g/dL   RDW 14.4 11.5 - 15.5 %   Platelets 223 150 - 400 K/uL   nRBC 0.0 0.0 - 0.2 %   Neutrophils Relative % 65 %   Neutro Abs 7.3 1.7 - 7.7 K/uL   Lymphocytes Relative 26 %   Lymphs Abs 2.9 0.7 - 4.0 K/uL   Monocytes Relative 5 %    Monocytes Absolute 0.5 0.1 - 1.0 K/uL   Eosinophils Relative 2 %   Eosinophils Absolute 0.2 0.0 - 0.5 K/uL   Basophils Relative 1 %   Basophils Absolute 0.1 0.0 - 0.1 K/uL   Immature Granulocytes 1 %   Abs Immature Granulocytes 0.06 0.00 - 0.07 K/uL    Comment: Performed at [facility], [address], [place] [address]  CMP     Status: Abnormal   Collection Time: 07/26/2019  6:21 AM  Result Value Ref Range   Sodium 135 135 - 145 mmol/L   Potassium 4.7 3.5 - 5.1 mmol/L   Chloride 97 (L) 98 -  111 mmol/L   CO2 23 22 - 32 mmol/L   Glucose, Bld 245 (H) 70 - 99 mg/dL   BUN 21 8 - 23 mg/dL   Creatinine, Ser 1.43 (H) 0.44 - 1.00 mg/dL   Calcium 9.7 8.9 - 10.3 mg/dL   Total Protein 6.6 6.5 - 8.1 g/dL   Albumin 3.4 (L) 3.5 - 5.0 g/dL   AST 54 (H) 15 - 41 U/L   ALT 33 0 - 44 U/L   Alkaline Phosphatase 107 38 - 126 U/L   Total Bilirubin 1.1 0.3 - 1.2 mg/dL   GFR calc non Af Amer 35 (L) >60 mL/min   GFR calc Af Amer 41 (L) >60 mL/min   Anion gap 15 5 - 15    Comment: Performed at [facility] [address] [place], [place] [address]  BNP     Status: Abnormal   Collection Time: 07/26/2019  6:21 AM  Result Value Ref Range   B Natriuretic Peptide 804.1 (H) 0.0 - 100.0 pg/mL    Comment: Performed at [facility] [address], [place], [place] [address]  Troponin I (High Sensitivity)     Status: Abnormal   Collection Time: 07/13/2019  6:21 AM  Result Value Ref Range   Troponin I (High Sensitivity) 67 (H) <18 ng/L    Comment: (NOTE) Elevated high sensitivity troponin I (hsTnI) values and significant  changes across serial measurements may suggest ACS but many other  chronic and acute conditions are known to elevate hsTnI results.  Refer to the "Links" section for chest pain algorithms and additional  guidance. Performed at [facility], [place] [address], [place], [place] [address]   Lactic acid, plasma     Status: Abnormal   Collection Time: 07/26/2019   6:24 AM  Result Value Ref Range   Lactic Acid, Venous 5.9 (HH) 0.5 - 1.9 mmol/L    Comment: CRITICAL RESULT CALLED TO, READ BACK BY AND VERIFIED WITH: SANGLON,S RN _0  ON [id number] BY FLEMINGS Performed at [facility], [place] [address], [place], [place] [address]   POC SARS Coronavirus 2 Ag-ED - Nasal Swab (BD Veritor Kit)     Status: None   Collection Time: 07/23/2019  6:41 AM  Result Value Ref Range   SARS Coronavirus 2 Ag NEGATIVE NEGATIVE    Comment: (NOTE) SARS-CoV-2 antigen NOT DETECTED.  Negative results are presumptive.  Negative results do not preclude SARS-CoV-2 infection and should not be used as the sole basis for treatment or other patient management decisions, including infection  control decisions, particularly in the presence of clinical signs and  symptoms consistent with COVID-19, or in those who have been in contact with the virus.  Negative results must be combined with clinical observations, patient history, and epidemiological information. The expected result is Negative. Fact Sheet for Patients: PodPark.tn Fact Sheet for Healthcare Providers: GiftContent.is This test is not yet approved or cleared by the [place] FDA and  has been authorized for detection and/or diagnosis of SARS-CoV-2 by FDA under an Emergency Use Authorization (EUA).  This EUA will remain in effect (meaning this test can be used) for the duration of  the COVID-19 de claration under Section 564(b)(1) of the Act, 21 U.S.C. section 360bbb-3(b)(1), unless the authorization is terminated or revoked sooner.   I-STAT 7, (LYTES, BLD GAS, ICA, H+H)     Status: Abnormal   Collection Time: 07/14/2019  7:15 AM  Result Value Ref Range  pH, Arterial 7.226 (L) 7.350 - 7.450   pCO2 arterial 55.0 (H) 32.0 - 48.0 mmHg   pO2, Arterial 87.0 83.0 - 108.0 mmHg   Bicarbonate 22.6 20.0 - 28.0 mmol/L   TCO2 24 22 - 32 mmol/L   O2 Saturation 94.0  %   Acid-base deficit 5.0 (H) 0.0 - 2.0 mmol/L   Sodium 129 (L) 135 - 145 mmol/L   Potassium 4.7 3.5 - 5.1 mmol/L   Calcium, Ion 1.24 1.15 - 1.40 mmol/L   HCT 42.0 36.0 - 46.0 %   Hemoglobin 14.3 12.0 - 15.0 g/dL   Patient temperature 99.9 F    Sample type ARTERIAL   Blood Culture (routine x 2)     Status: None (Preliminary result)   Collection Time: 07/04/2019  8:03 AM   Specimen: BLOOD  Result Value Ref Range   Specimen Description BLOOD LEFT ANTECUBITAL    Special Requests      BOTTLES DRAWN AEROBIC AND ANAEROBIC Blood Culture results may not be optimal due to an inadequate volume of blood received in culture bottles   Culture      NO GROWTH < 12 HOURS Performed at [facility] [address], [place], [place] [address]    Report Status PENDING   Lactic acid, plasma     Status: Abnormal   Collection Time: 07/26/2019  8:05 AM  Result Value Ref Range   Lactic Acid, Venous 5.2 (HH) 0.5 - 1.9 mmol/L    Comment: CRITICAL VALUE NOTED.  VALUE IS CONSISTENT WITH PREVIOUSLY REPORTED AND CALLED VALUE. Performed at [facility], [place] [address] [place], [place] [address]   Blood Culture (routine x 2)     Status: None (Preliminary result)   Collection Time: 07/25/2019  8:05 AM   Specimen: BLOOD RIGHT FOREARM  Result Value Ref Range   Specimen Description BLOOD RIGHT FOREARM    Special Requests      BOTTLES DRAWN AEROBIC ONLY Blood Culture results may not be optimal due to an inadequate volume of blood received in culture bottles   Culture      NO GROWTH < 12 HOURS Performed at [facility], [place] [address] [place], [place] [address]    Report Status PENDING   Respiratory Panel by RT PCR (Flu A&B, Covid) - Nasopharyngeal Swab     Status: None   Collection Time: 07/06/2019 10:37 AM   Specimen: Nasopharyngeal Swab  Result Value Ref Range   SARS Coronavirus 2 by RT PCR NEGATIVE NEGATIVE    Comment: (NOTE) SARS-CoV-2 target nucleic acids are NOT DETECTED. The SARS-CoV-2  RNA is generally detectable in upper respiratoy specimens during the acute phase of infection. The lowest concentration of SARS-CoV-2 viral copies this assay can detect is 131 copies/mL. A negative result does not preclude SARS-Cov-2 infection and should not be used as the sole basis for treatment or other patient management decisions. A negative result may occur with  improper specimen collection/handling, submission of specimen other than nasopharyngeal swab, presence of viral mutation(s) within the areas targeted by this assay, and inadequate number of viral copies (<131 copies/mL). A negative result must be combined with clinical observations, patient history, and epidemiological information. The expected result is Negative. Fact Sheet for Patients:  PinkCheek.be Fact Sheet for Healthcare Providers:  GravelBags.it This test is not yet ap proved or cleared by the [place] FDA and  has been authorized for detection and/or diagnosis of SARS-CoV-2 by FDA under an Emergency Use Authorization (  EUA). This EUA will remain  in effect (meaning this test can be used) for the duration of the COVID-19 declaration under Section 564(b)(1) of the Act, 21 U.S.C. section 360bbb-3(b)(1), unless the authorization is terminated or revoked sooner.    Influenza A by PCR NEGATIVE NEGATIVE   Influenza B by PCR NEGATIVE NEGATIVE    Comment: (NOTE) The Xpert Xpress SARS-CoV-2/FLU/RSV assay is intended as an aid in  the diagnosis of influenza from Nasopharyngeal swab specimens and  should not be used as a sole basis for treatment. Nasal washings and  aspirates are unacceptable for Xpert Xpress SARS-CoV-2/FLU/RSV  testing. Fact Sheet for Patients: PinkCheek.be Fact Sheet for Healthcare Providers: GravelBags.it This test is not yet approved or cleared by the [place] FDA and  has  been authorized for detection and/or diagnosis of SARS-CoV-2 by  FDA under an Emergency Use Authorization (EUA). This EUA will remain  in effect (meaning this test can be used) for the duration of the  Covid-19 declaration under Section 564(b)(1) of the Act, 21  U.S.C. section 360bbb-3(b)(1), unless the authorization is  terminated or revoked. Performed at [facility], [place] [address] [place], [place] [address]    DG Chest Portable 1 View  Result Date: 07/23/2019 CLINICAL DATA:  Shortness of breath, wheezing, congestion EXAM: PORTABLE CHEST 1 VIEW COMPARISON:  07/26/2019 FINDINGS: Interval placement of 2nd right chest tube with re-expansion of the right lung. Small residual right apical pneumothorax. Diffuse bilateral airspace disease, left greater than right concerning for pneumonia. Heart is mildly enlarged. No effusions or acute bony abnormality. IMPRESSION: Interval placement of 2nd right chest tube with near complete re-expansion of the right lung. Small residual right apical pneumothorax. Diffuse bilateral airspace disease, left greater than right, unchanged. Electronically Signed   By: Rolm Baptise M.D.   On: 06/30/2019 09:13   DG Chest Portable 1 View  Result Date: 07/24/2019 CLINICAL DATA:  Status post chest tube insertion EXAM: PORTABLE CHEST 1 VIEW COMPARISON:  Earlier today. FINDINGS: Interval placement of right-sided chest tube. Persistent and enlarging right-sided pneumothorax is again noted. Diffuse airspace and interstitial opacities throughout the left lung, right midlung and right lower lobe noted. Normal heart size. Aortic atherosclerosis. IMPRESSION: 1. Enlarging right-sided pneumothorax status post chest tube placement. 2. No change in bilateral interstitial and airspace opacities. Findings concerning for multifocal pneumonia. Followup PA and lateral chest X-ray is recommended in 3-4 weeks following trial of antibiotic therapy to ensure resolution and exclude underlying  malignancy. 3. Critical Value/emergent results were called by telephone at the time of interpretation on 07/26/2019 at 8:55 am to [place] , who verbally acknowledged these results. Electronically Signed   By: Kerby Moors M.D.   On: 07/17/2019 08:55   DG Chest Port 1 View  Result Date: 07/11/2019 CLINICAL DATA:  Shortness of breath and wheezing EXAM: PORTABLE CHEST 1 VIEW COMPARISON:  07/04/2011 FINDINGS: Cardiac shadow is mildly enlarged. Aortic calcifications are again seen. Vascular congestion is noted with diffuse increased airspace opacities within the mid and lower lungs bilaterally. This likely represents a component of pulmonary edema although acute inflammatory change could not be totally excluded. A large left-sided pneumothorax is noted not seen on the prior exam. No acute bony abnormality is noted. IMPRESSION: New right-sided pneumothorax of a moderate degree. Diffuse bilateral airspace opacity likely representing edema or multifocal pneumonia. Critical Value/emergent results were called by telephone at the time of interpretation on 06/29/2019 at 7:17 am to Dr. Addison Lank , who verbally  acknowledged these results. Electronically Signed   By: Inez Catalina M.D.   On: 07/08/2019 07:18    Pending Labs Unresulted Labs (From admission, onward)    Start     Ordered   07/13/2019 0701  Protime-INR  ONCE - STAT,   STAT     07/15/2019 0700   07/10/2019 0621  ABG  ONCE - STAT,   R     07/08/2019 0620          Vitals/Pain Today's Vitals   07/17/2019 0915 07/11/2019 0930 07/17/2019 1127 07/05/2019 1259  BP: (!) 84/75 (!) 66/47 (!) 75/51 (!) 88/64  Pulse: (!) 43 (!) 104 (!) 125 (!) 129  Resp: (!) 39 (!) _0 Temp:      TempSrc:      SpO2: 93% 92% 92% 95%  PainSc:    0-No pain    Isolation Precautions Airborne and Contact precautions  Medications Medications  morphine 2 MG/ML injection 2-4 mg (has no administration in time range)  morphine 176m in NS 1020m(20m49mL) infusion  - premix (1 mg/hr Intravenous New Bag/Given 07/12/2019 1042)  acetaminophen (TYLENOL) tablet 650 mg (has no administration in time range)    Or  acetaminophen (TYLENOL) suppository 650 mg (has no administration in time range)  haloperidol (HALDOL) tablet 0.5 mg (has no administration in time range)    Or  haloperidol (HALDOL) 2 MG/ML solution 0.5 mg (has no administration in time range)    Or  haloperidol lactate (HALDOL) injection 0.5 mg (has no administration in time range)  ondansetron (ZOFRAN-ODT) disintegrating tablet 4 mg (has no administration in time range)    Or  ondansetron (ZOFRAN) injection 4 mg (has no administration in time range)  glycopyrrolate (ROBINUL) tablet 1 mg (has no administration in time range)    Or  glycopyrrolate (ROBINUL) injection 0.2 mg (has no administration in time range)    Or  glycopyrrolate (ROBINUL) injection 0.2 mg (has no administration in time range)  antiseptic oral rinse (BIOTENE) solution 15 mL (has no administration in time range)  polyvinyl alcohol (LIQUIFILM TEARS) 1.4 % ophthalmic solution 1 drop (has no administration in time range)  LORazepam (ATIVAN) tablet 1 mg (has no administration in time range)    Or  LORazepam (ATIVAN) 2 MG/ML concentrated solution 1 mg (has no administration in time range)    Or  LORazepam (ATIVAN) injection 1 mg (has no administration in time range)  diphenhydrAMINE (BENADRYL) injection 12.5 mg (has no administration in time range)  albuterol (PROVENTIL) (2.5 MG/3ML) 0.083% nebulizer solution 2.5 mg (has no administration in time range)  magnesium sulfate IVPB 2 g 50 mL (0 g Intravenous Stopped 07/09/2019 0659)  methylPREDNISolone sodium succinate (SOLU-MEDROL) 125 mg/2 mL injection 125 mg (125 mg Intravenous Given 07/07/2019 0633)  ipratropium (ATROVENT) nebulizer solution 0.5 mg (0.5 mg Nebulization Given 07/10/2019 0635329LORazepam (ATIVAN) injection 1 mg (1 mg Intravenous Given 07/17/2019 0655)  furosemide (LASIX)  injection 40 mg (40 mg Intravenous Given 07/18/2019 0811)  0.9 %  sodium chloride infusion ( Intravenous Stopped 07/21/2019 1001)  0.9 %  sodium chloride infusion ( Intravenous Stopped 06/29/2019 1001)  morphine 4 MG/ML injection (4 mg  Given 07/22/2019 0950)    Mobility walks Low fall risk   Focused Assessments    R Recommendations: See Admitting Provider Note  Report given to:   Additional Notes:

## 2019-07-27 NOTE — Consult Note (Signed)
PULMONARY / CRITICAL CARE MEDICINE   NAME:  Jodi SUDDETH, MRN:  [id number], DOB:  24-Jan-1941, LOS: 0 ADMISSION DATE:  07/12/2019, CONSULTATION DATE: 07/15/2019 REFERRING MD: EDP, CHIEF COMPLAINT: Failure to thrive right pneumothorax atrial fibrillation ventricular response  BRIEF HISTORY:    78 year old female with COPD lung mass admitted with atrial fib and right pneumothorax  HISTORY OF PRESENT ILLNESS   78 year old female with a known history of COPD O2 dependent lung mass who presented with atrial fibrillation ventricular response noted to have a right pneumothorax.  Chest tubes x2 were placed by the emergency department physician.  Arborization with family reiterates the fact that she is a DNR and therefore goal of care will be comfort.  She was started on a morphine drip she remitted to palliative care tried hospital service with be the admitting physician.  Pulmonary critical care will sign off at this time SIGNIFICANT PAST MEDICAL HISTORY   COPD Lung mass Heart failure  SIGNIFICANT EVENTS:  07/14/2019 DNR STUDIES:    CULTURES:    ANTIBIOTICS:   Rocephin Zithromax  LINES/TUBES:   Chest tubes x2 on the right>> CONSULTANTS:  06/28/2019 PCCM SUBJECTIVE:  Elderly female who is end-stage and has been made a DNR  CONSTITUTIONAL: BP (!) 84/75   Pulse (!) 43   Temp 99.9 F (37.7 C) (Rectal)   Resp (!) 39   SpO2 93%   No intake/output data recorded.     Vent Mode: BIPAP;PCV FiO2 (%):  [100 %] 100 % Set Rate:  [12 bmp] 12 bmp PEEP:  [7 cmH20] 7 cmH20  PHYSICAL EXAM: General: Elderly female who is poorly responsive Neuro: Does open eyes poorly responsive HEENT: Poor dentition multiple missing teeth, oropharynx is dry Cardiovascular: Heart sounds are regular currently 114 Lungs: Decreased breath sounds on the right right chest tubes right lateral right anterior chest tubes on waterseal  Abdomen: Faint bowel sounds Musculoskeletal: No obvious injuries Skin:  Extremities are cool trunk is warm poor skin turgor  RESOLVED PROBLEM LIST   ASSESSMENT AND PLAN   End-stage COPD with lung mass in a 78 year old who has a poor prognosis for any meaningful survival.  She was noted to have a right pneumothorax requiring chest tubes x2 Raquel Sarna was contacted by Dr. Ina Homes and they agreed to a DNR status and a morphine drip with goal of care being comfort not curative Morphine drip Admit to hospice Full comfort care  Right pneumothorax  Chest tubes x2 20 cm waterseal Can be removed when she passes  SUMMARY OF TODAY'S PLAN:  78 year old female with past medical history of COPD O2 dependent followed by Dr. Stephanie Coup for lung mass who presented via EMS with heart rate 180s and atrial fibrillation.  She is noted to have right pneumothorax chest tubes x2 were placed.  Due to the fact she has an ongoing lung mass with probable prognosis after discussing with family she has made a full DNR comfort care with morphine drip will be transitioned to hospice floor.  Best Practice / Goals of Care / Disposition.   DVT PROPHYLAXIS: Not applicable SUP N/A NUTRITION: N/A MOBILITY: Bedrest GOALS OF CARE: DNR with morphine drip FAMILY DISCUSSIONS: Family updated 06/28/2019 agree with DNR morphine drip DISPOSITION admit to hospice area with [facility] hospitalist team admitting  LABS  Glucose No results for input(s): GLUCAP in the last 168 hours.  BMET Recent Labs  Lab 07/03/2019 0621 07/16/2019 0715  NA 135 129*  K 4.7 4.7  CL 97*  --  CO2 23  --   BUN 21  --   CREATININE 1.43*  --   GLUCOSE 245*  --     Liver Enzymes Recent Labs  Lab 07/25/2019 0621  AST 54*  ALT 33  ALKPHOS 107  BILITOT 1.1  ALBUMIN 3.4*    Electrolytes Recent Labs  Lab 07/15/2019 0621  CALCIUM 9.7    CBC Recent Labs  Lab 07/08/2019 0621 07/24/2019 0715  WBC 11.0*  --   HGB 13.5 14.3  HCT 45.1 42.0  PLT 223  --     ABG Recent Labs  Lab 07/26/2019 0715  PHART 7.226*  PCO2ART  55.0*  PO2ART 87.0    Coag's No results for input(s): APTT, INR in the last 168 hours.  Sepsis Markers Recent Labs  Lab 07/21/2019 0624 06/29/2019 0805  LATICACIDVEN 5.9* 5.2*    Cardiac Enzymes No results for input(s): TROPONINI, PROBNP in the last 168 hours.  PAST MEDICAL HISTORY :   She  has a past medical history of Arthritis, CAD (coronary artery disease) (03/2009), Chronic combined systolic and diastolic heart failure ([place]) (06/17/2009), CKD (chronic kidney disease) stage 3, GFR 30-59 ml/min, Emphysema/COPD (HCC), GERD (gastroesophageal reflux disease), HCAP (healthcare-associated pneumonia) (0/09/7046), Helicobacter pylori gastritis (10/2007), History of CVA (cerebrovascular accident) without residual deficits (01/2012, 06/2012), HLD (hyperlipidemia), HTN (hypertension), Ischemic cardiomyopathy (02/27/2014), Lung cancer ([place]) (dx'd 07/2006), Macular degeneration, Myocardial infarction ([place]) (03/2009), NSTEMI (non-ST elevated myocardial infarction) ([place]), Osteoporosis (09/2013), Pre-diabetes, and Syncope (02/24/2018).  PAST SURGICAL HISTORY:  She  has a past surgical history that includes Lung removal, partial (Right, 2008); Partial hysterectomy (1986); Breast biopsy (Left); Cataract extraction (Bilateral); Cardiac catheterization (N/A, 02/24/2016); Eye surgery; Coronary stent placement (06/2016); Cardiac catheterization (N/A, 07/09/2016); Cardiac catheterization (N/A, 07/09/2016); Coronary/Graft Acute MI Revascularization (N/A, 07/06/2017); LEFT HEART CATH AND CORONARY ANGIOGRAPHY (N/A, 07/06/2017); and CORONARY STENT INTERVENTION (N/A, 07/06/2017).  Allergies  Allergen Reactions  . Actonel [Risedronate Sodium] Other (See Comments)    Headache  . Amlodipine Other (See Comments)    Pedal edema  . Brilinta [Ticagrelor] Other (See Comments)    Worsening dyspnea, malaise  . Codeine Rash  . Fosamax [Alendronate Sodium] Other (See Comments)    Unable to tolerate  . Lisinopril Cough  .  Pravastatin Other (See Comments)    Constipation.  . Prednisone Other (See Comments)    Shaking (PILLS) Shot is ok    Current Facility-Administered Medications on File Prior to Encounter  Medication  . ipratropium-albuterol (DUONEB) 0.5-2.5 (3) MG/3ML nebulizer solution 3 mL   Current Outpatient Medications on File Prior to Encounter  Medication Sig  . albuterol (PROAIR HFA) 108 (90 Base) MCG/ACT inhaler Inhale 2 puffs into the lungs every 4 (four) hours as needed for wheezing or shortness of breath.  Marland Kitchen albuterol (PROVENTIL) (2.5 MG/3ML) 0.083% nebulizer solution Take 3 mLs (2.5 mg total) by nebulization every 4 (four) hours as needed for wheezing or shortness of breath.  Marland Kitchen aspirin EC 81 MG tablet Take 1 tablet (81 mg total) by mouth daily.  . budesonide (PULMICORT) 0.5 MG/2ML nebulizer solution Take 2 mLs (0.5 mg total) by nebulization 2 (two) times daily.  . calcium carbonate (OS-CAL - DOSED IN MG OF ELEMENTAL CALCIUM) 1250 (500 Ca) MG tablet Take 1 tablet by mouth 2 (two) times daily with a meal.  . cholecalciferol (VITAMIN D) 1000 units tablet Take 1,000 Units by mouth daily.  . clopidogrel (PLAVIX) 75 MG tablet TAKE 1 TABLET BY MOUTH ONCE DAILY      **BLOOD  THINNER**  . denosumab (PROLIA) 60 MG/ML SOLN injection Inject 60 mg into the skin every 6 (six) months. Administer in upper arm, thigh, or abdomen  . fluticasone (FLONASE) 50 MCG/ACT nasal spray INSTILL 2 SPRAYS INTO BOTH NOSTRILS ONCEDAILY AS DIRECTED  . furosemide (LASIX) 20 MG tablet TAKE 1 TABLET BY MOUTH ONCE DAILY. MAY TAKE AN ADDITIONAL 1/2 TABLET IF NEEDED FOR SWELLING  . ipratropium-albuterol (DUONEB) 0.5-2.5 (3) MG/3ML SOLN INHALE 1 VIAL VIA NEBULIZER FOUR TIMES ADAY OR EVERY 4 HOURS AS NEEDED AS DIRECTED.  Marland Kitchen isosorbide mononitrate (IMDUR) 30 MG 24 hr tablet TAKE 1 TABLET BY MOUTH ONCE A DAY  . loratadine (CLARITIN) 10 MG tablet Take 10 mg by mouth daily as needed for allergies or rhinitis.   Marland Kitchen nitroGLYCERIN (NITROSTAT)  0.4 MG SL tablet Place 1 tablet (0.4 mg total) under the tongue every 5 (five) minutes as needed for chest pain.  . OXYGEN Inhale 2 L into the lungs.  . pantoprazole (PROTONIX) 40 MG tablet Take 1 tablet (40 mg total) by mouth 2 (two) times daily.  . prednisoLONE acetate (PRED FORTE) 1 % ophthalmic suspension Place 1 drop into both eyes daily.   . simvastatin (ZOCOR) 40 MG tablet Take 1 tablet (40 mg total) by mouth at bedtime.  Marland Kitchen Spacer/Aero-Holding Chambers (AEROCHAMBER MV) inhaler Use as instructed  . spironolactone (ALDACTONE) 25 MG tablet TAKE 1/2 TABLET BY MOUTH DAILY    FAMILY HISTORY:   Her family history includes Breast cancer in her sister; CAD (age of onset: 80) in her father and mother; Diabetes in her sister; Heart attack in her mother; Hypertension in her mother; Stroke in her father and mother.  SOCIAL HISTORY:  She  reports that she quit smoking about 17 years ago. Her smoking use included cigarettes. She has a 21.00 pack-year smoking history. She has never used smokeless tobacco. She reports that she does not drink alcohol or use drugs.  REVIEW OF SYSTEMS:    Not available   Richardson Landry Anina Schnake ACNP Acute Care Nurse Practitioner [place] Please consult [place] 07/06/2019, 9:43 AM

## 2019-07-27 NOTE — H&P (Addendum)
History and Physical    [name] HUO:[id number] DOB: 08-22-1940 DOA: 07/19/2019  Referring MD/NP/PA: Dorie Rank, MD PCP: Jodelle Green, FNP  Patient coming from: Home via EMS  Chief Complaint: Shortness of breath  I have personally briefly reviewed patient's old medical records in [place]   HPI: [name] is a 78 y.o. female with medical history significant of COPD, oxygen dependent, HTN, HLD, CAD, and lung cancer s/p resection.  History is obtained in talks with the patient's niece as patient unable to give her own at this time due to her current condition.  The patient over the last few weeks had been having worsening shortness of breath.  She had been recently prescribed antibiotics Cipro and after following up with her pulmonologist Dr. Malvin Johns on the 8th.  Symptoms acutely worsened over the last day for which EMS was called.    En route EMS patient was found to be in A. fib with RVR with heart rates up to 180.  She was given 150 mg of amiodarone IV prior to arrival with improvement of heart rates down to 130.  ED Course: On admission to the emergency department patient was seen to have a temperature of 99.9 F, pulse up to 131 in A. fib, respirations 19-39, blood pressures as low as 82/68, and O2 saturations 76% with improvement to 96% after placed on BiPAP.  Initial ABG revealed pH 7.226, PCO2 55, and PO2 87.  Chest x-ray showed a right-sided pneumothorax with multifocal opacities concerning for infection versus edema.  The ED provider placed 2 separate chest tubes with near resolution of pneumothorax on repeat chest x-rays.  Labs significant for WBC 11, BUN 21, creatinine 1.43, AST 54, ALT 33, BNP 804.1, troponin 67, lactic acid 5.9->5.2. Sepsis protocol had been initiated.  Patient had been given 125 mg of Solu-Medrol IV, 2 g of magnesium sulfate, Ativan, multiple breathing treatments, Rocephin, and azithromycin.  Critical care have been consulted due to the severity of  her condition.  However, after talks with the niece over the phone it was decided to proceed with comfort care measures.  Review of Systems  Unable to perform ROS: Critical illness    Past Medical History:  Diagnosis Date  . Arthritis   . CAD (coronary artery disease) 03/2009   s/p MI  . Chronic combined systolic and diastolic heart failure ([place]) 06/17/2009   Qualifier: Diagnosis of  By: Burt Knack, MD, [place] Pert   . CKD (chronic kidney disease) stage 3, GFR 30-59 ml/min   . Emphysema/COPD ([place])   . GERD (gastroesophageal reflux disease)   . HCAP (healthcare-associated pneumonia) 03/19/2016  . Helicobacter pylori gastritis 10/2007   treated  . History of CVA (cerebrovascular accident) without residual deficits 01/2012, 06/2012   R hemorrhagic MCA 01/2012 with remote lacunar infarct L putamen and IC, rpt 06/2012 acute multifocal R MCA infarct with remote hemorrhagic strokes affecting L basal ganglia and periventricular white matter, full recovery  . HLD (hyperlipidemia)   . HTN (hypertension)   . Ischemic cardiomyopathy 02/27/2014  . Lung cancer ([place]) dx'd 07/2006   Lung CA, s/p resection, followed by Dr Earlie Server  . Macular degeneration   . Myocardial infarction [facility]) 03/2009   Acute myocardial infarction 2010 - treated with BMS of LCx. LVEF 50% with subsequent CHF  . NSTEMI (non-ST elevated myocardial infarction) ([place])   . Osteoporosis 09/2013   T -3.6 forearm 09/2013, T -4.5 forearm 04/2015  . Pre-diabetes   . Syncope 02/24/2018  Past Surgical History:  Procedure Laterality Date  . BREAST BIOPSY Left   . CARDIAC CATHETERIZATION N/A 02/24/2016   Procedure: Left Heart Cath and Coronary Angiography;  Surgeon: Belva Crome, MD;  Location: [place] [facility];  Service: Cardiovascular;  Laterality: N/A;  . CARDIAC CATHETERIZATION N/A 07/09/2016   Procedure: Left Heart Cath and Coronary Angiography;  Surgeon: Peter M Martinique, MD;  Location: [place] [facility];  Service: Cardiovascular;   Laterality: N/A;  . CARDIAC CATHETERIZATION N/A 07/09/2016   Procedure: Coronary Stent Intervention;  Surgeon: Peter M Martinique, MD;  Location: [place] [facility];  Service: Cardiovascular;  Laterality: N/A;  . CATARACT EXTRACTION Bilateral   . CORONARY STENT INTERVENTION N/A 07/06/2017   Procedure: CORONARY STENT INTERVENTION;  Surgeon: Lorretta Harp, MD;  Location: [place] [facility];  Service: Cardiovascular;  Laterality: N/A;  . CORONARY STENT PLACEMENT  06/2016   DES to mid LAD  . CORONARY/GRAFT ACUTE MI REVASCULARIZATION N/A 07/06/2017   Procedure: Coronary/Graft Acute MI Revascularization;  Surgeon: Lorretta Harp, MD;  Location: [place] [facility];  Service: Cardiovascular;  Laterality: N/A;  . EYE SURGERY    . LEFT HEART CATH AND CORONARY ANGIOGRAPHY N/A 07/06/2017   Procedure: LEFT HEART CATH AND CORONARY ANGIOGRAPHY;  Surgeon: Lorretta Harp, MD;  Location: [place] [facility];  Service: Cardiovascular;  Laterality: N/A;  . LUNG REMOVAL, PARTIAL Right 2008  . PARTIAL HYSTERECTOMY  1986   irregular periods, ovaries remain     reports that she quit smoking about 17 years ago. Her smoking use included cigarettes. She has a 21.00 pack-year smoking history. She has never used smokeless tobacco. She reports that she does not drink alcohol or use drugs.  Allergies  Allergen Reactions  . Actonel [Risedronate Sodium] Other (See Comments)    Headache  . Amlodipine Other (See Comments)    Pedal edema  . Brilinta [Ticagrelor] Other (See Comments)    Worsening dyspnea, malaise  . Codeine Rash  . Fosamax [Alendronate Sodium] Other (See Comments)    Unable to tolerate  . Lisinopril Cough  . Pravastatin Other (See Comments)    Constipation.  . Prednisone Other (See Comments)    Shaking (PILLS) Shot is ok    Family History  Problem Relation Age of Onset  . CAD Mother 54       MI  . Stroke Mother   . Hypertension Mother   . Heart attack Mother   . CAD Father 62  .  Stroke Father   . Breast cancer Sister   . Diabetes Sister     Prior to Admission medications   Medication Sig Start Date End Date Taking? Authorizing Provider  albuterol (PROAIR HFA) 108 (90 Base) MCG/ACT inhaler Inhale 2 puffs into the lungs every 4 (four) hours as needed for wheezing or shortness of breath. 07/04/19   Collene Gobble, MD  albuterol (PROVENTIL) (2.5 MG/3ML) 0.083% nebulizer solution Take 3 mLs (2.5 mg total) by nebulization every 4 (four) hours as needed for wheezing or shortness of breath. 07/24/19   Collene Gobble, MD  aspirin EC 81 MG tablet Take 1 tablet (81 mg total) by mouth daily. 02/26/16   Arbutus Leas, NP  budesonide (PULMICORT) 0.5 MG/2ML nebulizer solution Take 2 mLs (0.5 mg total) by nebulization 2 (two) times daily. 04/20/19   Collene Gobble, MD  calcium carbonate (OS-CAL - DOSED IN MG OF ELEMENTAL CALCIUM) 1250 (500 Ca) MG tablet Take 1 tablet by  mouth 2 (two) times daily with a meal.    [provider]  cholecalciferol (VITAMIN D) 1000 units tablet Take 1,000 Units by mouth daily.    [provider]  clopidogrel (PLAVIX) 75 MG tablet TAKE 1 TABLET BY MOUTH ONCE DAILY      **BLOOD THINNER** 05/16/19   Sherren Mocha, MD  denosumab (PROLIA) 60 MG/ML SOLN injection Inject 60 mg into the skin every 6 (six) months. Administer in upper arm, thigh, or abdomen 06/13/15   Ria Bush, MD  fluticasone [facility]) 50 MCG/ACT nasal spray INSTILL 2 SPRAYS INTO BOTH NOSTRILS ONCEDAILY AS DIRECTED 01/11/19   Collene Gobble, MD  furosemide (LASIX) 20 MG tablet TAKE 1 TABLET BY MOUTH ONCE DAILY. MAY TAKE AN ADDITIONAL 1/2 TABLET IF NEEDED FOR SWELLING 04/18/19   Sherren Mocha, MD  ipratropium-albuterol (DUONEB) 0.5-2.5 (3) MG/3ML SOLN INHALE 1 VIAL VIA NEBULIZER FOUR TIMES ADAY OR EVERY 4 HOURS AS NEEDED AS DIRECTED. 05/17/19   Collene Gobble, MD  isosorbide mononitrate (IMDUR) 30 MG 24 hr tablet TAKE 1 TABLET BY MOUTH ONCE A DAY 04/07/19   Sherren Mocha,  MD  loratadine (CLARITIN) 10 MG tablet Take 10 mg by mouth daily as needed for allergies or rhinitis.     [provider]  nitroGLYCERIN (NITROSTAT) 0.4 MG SL tablet Place 1 tablet (0.4 mg total) under the tongue every 5 (five) minutes as needed for chest pain. 11/28/18   Sherren Mocha, MD  OXYGEN Inhale 2 L into the lungs.    [provider]  pantoprazole (PROTONIX) 40 MG tablet Take 1 tablet (40 mg total) by mouth 2 (two) times daily. 06/26/19   Collene Gobble, MD  prednisoLONE acetate (PRED FORTE) 1 % ophthalmic suspension Place 1 drop into both eyes daily.     [provider]  simvastatin (ZOCOR) 40 MG tablet Take 1 tablet (40 mg total) by mouth at bedtime. 06/15/19   Sherren Mocha, MD  Spacer/Aero-Holding Chambers (AEROCHAMBER MV) inhaler Use as instructed 12/28/16   Collene Gobble, MD  spironolactone (ALDACTONE) 25 MG tablet TAKE 1/2 TABLET BY MOUTH DAILY 01/09/19   Sherren Mocha, MD    Physical Exam:  Constitutional: Elderly female who appears ill and is not responsive at this time  Vitals:   07/25/2019 0830 07/07/2019 0845 07/06/2019 0900 07/18/2019 0915  BP: 129/80 99/64 (!) 82/68 (!) 84/75  Pulse: (!) 116 (!) 31 (!) 35 (!) 43  Resp: (!) 32 19 (!) 34 (!) 39  Temp:      TempSrc:      SpO2: (!) 87% (!) 76% 96% 93%   Eyes: PERRL, lids and conjunctivae normal ENMT: Mucous membranes are dry. Posterior pharynx clear of any exudate or lesions.  Neck: normal, supple, no masses, no thyromegaly Respiratory: Tachypneic with expiratory wheezes appreciated in both lung fields and accessory muscle usage.  Currently on nonrebreather. Cardiovascular: Tachycardic, no murmurs / rubs / gallops. No extremity edema. 2+ pedal pulses. No carotid bruits.  Abdomen: no tenderness, no masses palpated. No hepatosplenomegaly. Bowel sounds positive.  Musculoskeletal: no clubbing / cyanosis. No joint deformity upper and lower extremities. Good ROM, no contractures. Normal muscle tone.    Skin: no rashes, lesions, ulcers. No induration Neurologic: CN 2-12 grossly intact.  Patient moves extremities to noxious stimuli. Psychiatric: Unable to assess as patient is currently not responsive   Labs on Admission: I have personally reviewed following labs and imaging studies  CBC: Recent Labs  Lab 07/19/2019 0621 06/29/2019 0715  WBC 11.0*  --   NEUTROABS 7.3  --   HGB 13.5 14.3  HCT 45.1 42.0  MCV 101.3*  --   PLT 223  --    Basic Metabolic Panel: Recent Labs  Lab 07/16/2019 0621 07/02/2019 0715  NA 135 129*  K 4.7 4.7  CL 97*  --   CO2 23  --   GLUCOSE 245*  --   BUN 21  --   CREATININE 1.43*  --   CALCIUM 9.7  --    GFR: CrCl cannot be calculated (Unknown ideal weight.). Liver Function Tests: Recent Labs  Lab 07/01/2019 0621  AST 54*  ALT 33  ALKPHOS 107  BILITOT 1.1  PROT 6.6  ALBUMIN 3.4*   No results for input(s): LIPASE, AMYLASE in the last 168 hours. No results for input(s): AMMONIA in the last 168 hours. Coagulation Profile: No results for input(s): INR, PROTIME in the last 168 hours. Cardiac Enzymes: No results for input(s): CKTOTAL, CKMB, CKMBINDEX, TROPONINI in the last 168 hours. BNP (last 3 results) No results for input(s): PROBNP in the last 8760 hours. HbA1C: No results for input(s): HGBA1C in the last 72 hours. CBG: No results for input(s): GLUCAP in the last 168 hours. Lipid Profile: No results for input(s): CHOL, HDL, LDLCALC, TRIG, CHOLHDL, LDLDIRECT in the last 72 hours. Thyroid Function Tests: No results for input(s): TSH, T4TOTAL, FREET4, T3FREE, THYROIDAB in the last 72 hours. Anemia Panel: No results for input(s): VITAMINB12, FOLATE, FERRITIN, TIBC, IRON, RETICCTPCT in the last 72 hours. Urine analysis:    Component Value Date/Time   COLORURINE COLORLESS (A) 02/24/2018 1335   APPEARANCEUR CLEAR 02/24/2018 1335   LABSPEC 1.005 02/24/2018 1335   PHURINE 6.0 02/24/2018 1335   GLUCOSEU NEGATIVE 02/24/2018 1335   HGBUR  NEGATIVE 02/24/2018 1335   BILIRUBINUR NEGATIVE 02/24/2018 1335   KETONESUR NEGATIVE 02/24/2018 1335   PROTEINUR NEGATIVE 02/24/2018 1335   UROBILINOGEN 0.2 05/05/2009 0353   NITRITE NEGATIVE 02/24/2018 1335   LEUKOCYTESUR NEGATIVE 02/24/2018 1335   Sepsis Labs: No results found for this or any previous visit (from the past 240 hour(s)).   Radiological Exams on Admission: DG Chest Portable 1 View  Result Date: 07/10/2019 CLINICAL DATA:  Shortness of breath, wheezing, congestion EXAM: PORTABLE CHEST 1 VIEW COMPARISON:  07/04/2019 FINDINGS: Interval placement of 2nd right chest tube with re-expansion of the right lung. Small residual right apical pneumothorax. Diffuse bilateral airspace disease, left greater than right concerning for pneumonia. Heart is mildly enlarged. No effusions or acute bony abnormality. IMPRESSION: Interval placement of 2nd right chest tube with near complete re-expansion of the right lung. Small residual right apical pneumothorax. Diffuse bilateral airspace disease, left greater than right, unchanged. Electronically Signed   By: Rolm Baptise M.D.   On: 07/23/2019 09:13   DG Chest Portable 1 View  Result Date: 07/11/2019 CLINICAL DATA:  Status post chest tube insertion EXAM: PORTABLE CHEST 1 VIEW COMPARISON:  Earlier today. FINDINGS: Interval placement of right-sided chest tube. Persistent and enlarging right-sided pneumothorax is again noted. Diffuse airspace and interstitial opacities throughout the left lung, right midlung and right lower lobe noted. Normal heart size. Aortic atherosclerosis. IMPRESSION: 1. Enlarging right-sided pneumothorax status post chest tube placement. 2. No change in bilateral interstitial and airspace opacities. Findings concerning for multifocal pneumonia. Followup PA and lateral chest X-ray is recommended in 3-4 weeks following trial of antibiotic therapy to ensure resolution and exclude underlying malignancy. 3. Critical Value/emergent results  were called by telephone at the  time of interpretation on 07/04/2019 at 8:55 am to [place] , who verbally acknowledged these results. Electronically Signed   By: Kerby Moors M.D.   On: 07/23/2019 08:55   DG Chest Port 1 View  Result Date: 07/07/2019 CLINICAL DATA:  Shortness of breath and wheezing EXAM: PORTABLE CHEST 1 VIEW COMPARISON:  07/04/2011 FINDINGS: Cardiac shadow is mildly enlarged. Aortic calcifications are again seen. Vascular congestion is noted with diffuse increased airspace opacities within the mid and lower lungs bilaterally. This likely represents a component of pulmonary edema although acute inflammatory change could not be totally excluded. A large left-sided pneumothorax is noted not seen on the prior exam. No acute bony abnormality is noted. IMPRESSION: New right-sided pneumothorax of a moderate degree. Diffuse bilateral airspace opacity likely representing edema or multifocal pneumonia. Critical Value/emergent results were called by telephone at the time of interpretation on 07/08/2019 at 7:17 am to Dr. Addison Lank , who verbally acknowledged these results. Electronically Signed   By: Inez Catalina M.D.   On: 07/25/2019 07:18    EKG: Independently reviewed.  Atrial fibrillation at 127 bpm  Assessment/Plan Right-sided pneumothorax COPD exacerbation Acute on chronic respiratory failure with hypercapnia and hypoxia Oxygen dependent Lung mass Atrial fibrillation with RVR Essential hypertension History of lung cancer Chronic kidney disease stage III b cute on chronic combined systolic and diastolic congestive heart failure DNR/DNI present on admission Comfort care only   Patient presented with progressively worsening shortness of breath over the last 24 hours.  Found to be in A. fib with RVR with heart rates initially up into the 180s.  Initial O2 saturations as low as 79% on room air which patient had initially been placed on BiPAP.  X-ray of the chest  revealed large right-sided pneumothorax with interstitial edema concerning for infection versus edema.  Patient was known to be DNR/DNI.  ABG revealed respiratory acidosis with hypercapnia.  2 chest tubes had been placed and patient was noted not to be responsive during this time.  Initial point-of-care Covid testing was negative.  Repeat chest x-ray showed reexpansion of the right lung with near complete resolution of the pneumothorax.  She was noted to have significant wheezing in both lung fields O2 saturation maintained on nonrebreather.  Patient was thought to need ICU care given the severity of her symptoms.  Critical care was consulted,  but after further talks with family over the phone it was decided to make the patient comfort care.  A morphine drip was started.  We discussed patient's care with the family who makes note that the patient has suffered from multiple medical problems, and at this time they would just like to keep her comfortable. -Admitting for comfort care measures only -Discontinue previous orders for antibiotics, lab draws, and IV fluids -N.p.o. -Palliative care consulted, will follow-up for further recommendation -Morphine gtt -Ativan as needed for anxiety  -Okay for RN to pronounce  DVT prophylaxis: None Code Status: DNR/DNI Family Communication: Discussed plan of care with the patient's niece over the phone Disposition Plan: This hospitalization will likely end in patient passing away Consults called: PCCM, palliative care Admission status: inpatient   Norval Morton MD [facility] Pager [phone]   If 7PM-7AM, please contact night-coverage [email or web address] Password [email or web address]  07/07/2019, 9:57 AM

## 2019-07-27 NOTE — ED Notes (Signed)
Son remains at bedside. Patient is resting and appears calm at present.

## 2019-07-27 NOTE — ED Notes (Signed)
RT applied BIPAP and continuous nebulizer treatment .

## 2019-07-27 NOTE — ED Notes (Addendum)
[name](Son#[phone]) called/would like a call back from patient's Doctor.  Called - Dr. Tamala Julian

## 2019-07-27 NOTE — ED Notes (Signed)
Dr. Tamala Julian in patient room spoke with son.

## 2019-07-27 NOTE — ED Notes (Signed)
Pt switched to 2L [place] per MD.

## 2019-07-27 NOTE — ED Notes (Signed)
Critical Care at bedside. , Dr. Tamala Julian called and spoke with Niece on the phone , patient made comfort care.

## 2019-07-27 NOTE — ED Notes (Signed)
Paged PCCM to [facility]

## 2019-07-27 NOTE — ED Provider Notes (Signed)
[place] EMERGENCY DEPARTMENT Provider Note  CSN: [id number] Arrival date & time: 07/15/2019 0545  Chief Complaint(s) Shortness of Breath (COPD/CHF)  HPI [name] is a 78 y.o. female with a past medical history listed below including COPD on 2 L nasal cannula at home who presents to the emergency department reportedly for gradually worsening shortness of breath since yesterday.  Patient brought in by EMS who reported patient initially had heart rate in the 180s and in A. fib.  She was given amiodarone and the heart rate improved to the 130s.  History is difficult due to the patient's respiratory distress and somnolence.  However she is able to provide some details and reports that she has been using her albuterol inhalers with some relief.  She endorses coughing without fever.  No chest pain.  No known fevers or infections.  No known sick contacts.  Remainder of history, ROS, and physical exam limited due to patient's condition (acuity and somnolence).   Level V Caveat.   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/2009   Qualifier: Diagnosis of  By: Burt Knack, MD, Clayburn Pert    CKD (chronic kidney disease) stage 3, GFR 30-59 ml/min    Emphysema/COPD (HCC)    GERD (gastroesophageal reflux disease)    HCAP (healthcare-associated pneumonia) 7/82/9562   Helicobacter pylori gastritis 10/2007   treated   History of CVA (cerebrovascular accident) without residual deficits 01/2012, 06/2012   R hemorrhagic MCA 01/2012 with remote lacunar infarct L putamen and IC, rpt 06/2012 acute multifocal R MCA infarct with remote hemorrhagic strokes affecting L basal ganglia and periventricular white matter, full recovery   HLD (hyperlipidemia)    HTN (hypertension)    Ischemic cardiomyopathy 02/27/2014   Lung cancer  ([place]) dx'd 07/2006   Lung CA, s/p resection, followed by Dr Earlie Server   Macular degeneration    Myocardial infarction [facility]) 03/2009   Acute myocardial infarction 2010 - treated with BMS of LCx. LVEF 50% with subsequent CHF   NSTEMI (non-ST elevated myocardial infarction) ([place])    Osteoporosis 09/2013   T -3.6 forearm 09/2013, T -4.5 forearm 04/2015   Pre-diabetes    Syncope 02/24/2018   Patient Active Problem List   Diagnosis Date Noted   Acute bronchitis with COPD ([place]) 07/04/2019   Acute non-recurrent maxillary sinusitis 06/14/2018   Pulmonary edema 03/18/2018   Atrial fibrillation with rapid ventricular response ([place])    Pressure injury of skin 02/25/2018   Syncope 02/24/2018   AF (paroxysmal atrial fibrillation) ([place]) 02/24/2018   Chronic respiratory failure with hypoxia ([place]) 11/23/2017   PVC's (premature ventricular contractions) 08/31/2017   Skin rash 08/02/2017   History of ST elevation myocardial infarction (STEMI) 07/06/2017   Dyspnea 05/03/2017   DNR (do not resuscitate) 12/07/2016   Allergic rhinitis 07/31/2016   Productive cough 06/29/2016   Symptomatic bradycardia 04/01/2016   Malnutrition of moderate degree 03/20/2016   COPD (chronic obstructive pulmonary disease) ([place]) 03/19/2016   Lumbar pain with radiation down left leg 03/02/2016   Underweight 10/07/2015   Vertigo 04/22/2015   Skin nodule 04/22/2015   Health maintenance examination 04/08/2015   Advanced care planning/counseling discussion 04/08/2015   Vitamin D insufficiency 02/02/2015   CKD (chronic kidney disease) stage 3, GFR 30-59 ml/min (HCC)    History of CVA (cerebrovascular accident) without residual  deficits    Prediabetes    Ischemic cardiomyopathy 02/27/2014   Osteoporosis 09/24/2013   Pulmonary nodules 10/26/2012   History of TIA (transient ischemic attack) 07/08/2012   Cancer of lower lobe of right lung ([place]) 04/21/2012   Acute on chronic combined  systolic and diastolic CHF (congestive heart failure) ([place]) 06/17/2009   HLD (hyperlipidemia) 04/24/2009   CAD (coronary artery disease) 04/24/2009   GERD 04/24/2009   Macular degeneration (senile) of retina 04/23/2009   Essential hypertension 04/23/2009   Osteoarthritis 04/23/2009   Home Medication(s) Prior to Admission medications   Medication Sig Start Date End Date Taking? Authorizing Provider  albuterol (PROAIR HFA) 108 (90 Base) MCG/ACT inhaler Inhale 2 puffs into the lungs every 4 (four) hours as needed for wheezing or shortness of breath. 07/04/19   Collene Gobble, MD  albuterol (PROVENTIL) (2.5 MG/3ML) 0.083% nebulizer solution Take 3 mLs (2.5 mg total) by nebulization every 4 (four) hours as needed for wheezing or shortness of breath. 07/24/19   Collene Gobble, MD  aspirin EC 81 MG tablet Take 1 tablet (81 mg total) by mouth daily. 02/26/16   Arbutus Leas, NP  budesonide (PULMICORT) 0.5 MG/2ML nebulizer solution Take 2 mLs (0.5 mg total) by nebulization 2 (two) times daily. 04/20/19   Collene Gobble, MD  calcium carbonate (OS-CAL - DOSED IN MG OF ELEMENTAL CALCIUM) 1250 (500 Ca) MG tablet Take 1 tablet by mouth 2 (two) times daily with a meal.    [provider]  cholecalciferol (VITAMIN D) 1000 units tablet Take 1,000 Units by mouth daily.    [provider]  clopidogrel (PLAVIX) 75 MG tablet TAKE 1 TABLET BY MOUTH ONCE DAILY      **BLOOD THINNER** 05/16/19   Sherren Mocha, MD  denosumab (PROLIA) 60 MG/ML SOLN injection Inject 60 mg into the skin every 6 (six) months. Administer in upper arm, thigh, or abdomen 06/13/15   Ria Bush, MD  fluticasone [facility]) 50 MCG/ACT nasal spray INSTILL 2 SPRAYS INTO BOTH NOSTRILS ONCEDAILY AS DIRECTED 01/11/19   Collene Gobble, MD  furosemide (LASIX) 20 MG tablet TAKE 1 TABLET BY MOUTH ONCE DAILY. MAY TAKE AN ADDITIONAL 1/2 TABLET IF NEEDED FOR SWELLING 04/18/19   Sherren Mocha, MD  ipratropium-albuterol (DUONEB)  0.5-2.5 (3) MG/3ML SOLN INHALE 1 VIAL VIA NEBULIZER FOUR TIMES ADAY OR EVERY 4 HOURS AS NEEDED AS DIRECTED. 05/17/19   Collene Gobble, MD  isosorbide mononitrate (IMDUR) 30 MG 24 hr tablet TAKE 1 TABLET BY MOUTH ONCE A DAY 04/07/19   Sherren Mocha, MD  loratadine (CLARITIN) 10 MG tablet Take 10 mg by mouth daily as needed for allergies or rhinitis.     [provider]  nitroGLYCERIN (NITROSTAT) 0.4 MG SL tablet Place 1 tablet (0.4 mg total) under the tongue every 5 (five) minutes as needed for chest pain. 11/28/18   Sherren Mocha, MD  OXYGEN Inhale 2 L into the lungs.    [provider]  pantoprazole (PROTONIX) 40 MG tablet Take 1 tablet (40 mg total) by mouth 2 (two) times daily. 06/26/19   Collene Gobble, MD  prednisoLONE acetate (PRED FORTE) 1 % ophthalmic suspension Place 1 drop into both eyes daily.     [provider]  simvastatin (ZOCOR) 40 MG tablet Take 1 tablet (40 mg total) by mouth at bedtime. 06/15/19   Sherren Mocha, MD  Spacer/Aero-Holding Chambers (AEROCHAMBER MV) inhaler Use as instructed 12/28/16   Collene Gobble, MD  spironolactone (ALDACTONE) 25  MG tablet TAKE 1/2 TABLET BY MOUTH DAILY 01/09/19   Sherren Mocha, MD                                                                                                                                    Past Surgical History Past Surgical History:  Procedure Laterality Date   BREAST BIOPSY Left    CARDIAC CATHETERIZATION N/A 02/24/2016   Procedure: Left Heart Cath and Coronary Angiography;  Surgeon: Belva Crome, MD;  Location: [place] CV LAB;  Service: Cardiovascular;  Laterality: N/A;   CARDIAC CATHETERIZATION N/A 07/09/2016   Procedure: Left Heart Cath and Coronary Angiography;  Surgeon: Peter M Martinique, MD;  Location: [place] CV LAB;  Service: Cardiovascular;  Laterality: N/A;   CARDIAC CATHETERIZATION N/A 07/09/2016   Procedure: Coronary Stent Intervention;  Surgeon: Peter M Martinique, MD;   Location: [place] CV LAB;  Service: Cardiovascular;  Laterality: N/A;   CATARACT EXTRACTION Bilateral    CORONARY STENT INTERVENTION N/A 07/06/2017   Procedure: CORONARY STENT INTERVENTION;  Surgeon: Lorretta Harp, MD;  Location: [place] CV LAB;  Service: Cardiovascular;  Laterality: N/A;   CORONARY STENT PLACEMENT  06/2016   DES to mid LAD   CORONARY/GRAFT ACUTE MI REVASCULARIZATION N/A 07/06/2017   Procedure: Coronary/Graft Acute MI Revascularization;  Surgeon: Lorretta Harp, MD;  Location: [place] CV LAB;  Service: Cardiovascular;  Laterality: N/A;   EYE SURGERY     LEFT HEART CATH AND CORONARY ANGIOGRAPHY N/A 07/06/2017   Procedure: LEFT HEART CATH AND CORONARY ANGIOGRAPHY;  Surgeon: Lorretta Harp, MD;  Location: [place] CV LAB;  Service: Cardiovascular;  Laterality: N/A;   LUNG REMOVAL, PARTIAL Right 2008   PARTIAL HYSTERECTOMY  1986   irregular periods, ovaries remain   Family History Family History  Problem Relation Age of Onset   CAD Mother 26       MI   Stroke Mother    Hypertension Mother    Heart attack Mother    CAD Father 31   Stroke Father    Breast cancer Sister    Diabetes Sister     Social History Social History   Tobacco Use   Smoking status: Former Smoker    Packs/day: 0.50    Years: 42.00    Pack years: 21.00    Types: Cigarettes    Quit date: 07/27/2002    Years since quitting: 17.0   Smokeless tobacco: Never Used  Substance Use Topics   Alcohol use: No    Alcohol/week: 0.0 standard drinks   Drug use: No   Allergies Actonel [risedronate sodium], Amlodipine, Brilinta [ticagrelor], Codeine, Fosamax [alendronate sodium], Lisinopril, Pravastatin, and Prednisone  Review of Systems Review of Systems  Unable to perform ROS: Acuity of condition    Physical Exam Vital Signs  I have reviewed the triage vital signs BP (!) 84/75    Pulse (!) 43  Temp 99.9 F (37.7 C) (Rectal)    Resp (!) 39    SpO2 93%     Physical Exam Vitals reviewed.  Constitutional:      General: She is not in acute distress.    Appearance: She is well-developed. She is not diaphoretic.  HENT:     Head: Normocephalic and atraumatic.     Nose: Nose normal.  Eyes:     General: No scleral icterus.       Right eye: No discharge.        Left eye: No discharge.     Conjunctiva/sclera: Conjunctivae normal.     Pupils: Pupils are equal, round, and reactive to light.  Cardiovascular:     Rate and Rhythm: Normal rate and regular rhythm.     Heart sounds: No murmur. No friction rub. No gallop.   Pulmonary:     Effort: Tachypnea, accessory muscle usage and prolonged expiration present. No respiratory distress.     Breath sounds: Decreased air movement present. No stridor. Wheezing present. No rales.     Comments: Pursed lips Abdominal:     General: There is no distension.     Palpations: Abdomen is soft.     Tenderness: There is no abdominal tenderness.  Musculoskeletal:        General: No tenderness.     Cervical back: Normal range of motion and neck supple.     Right lower leg: No edema.     Left lower leg: No edema.  Skin:    General: Skin is warm and dry.     Findings: No erythema or rash.  Neurological:     Mental Status: She is alert and oriented to person, place, and time.     ED Results and Treatments Labs (all labs ordered are listed, but only abnormal results are displayed) Labs Reviewed  CBC WITH DIFFERENTIAL/PLATELET - Abnormal; Notable for the following components:      Result Value   WBC 11.0 (*)    MCV 101.3 (*)    MCHC 29.9 (*)    All other components within normal limits  COMPREHENSIVE METABOLIC PANEL - Abnormal; Notable for the following components:   Chloride 97 (*)    Glucose, Bld 245 (*)    Creatinine, Ser 1.43 (*)    Albumin 3.4 (*)    AST 54 (*)    GFR calc non Af Amer 35 (*)    GFR calc Af Amer 41 (*)    All other components within normal limits  BRAIN NATRIURETIC PEPTIDE -  Abnormal; Notable for the following components:   B Natriuretic Peptide 804.1 (*)    All other components within normal limits  LACTIC ACID, PLASMA - Abnormal; Notable for the following components:   Lactic Acid, Venous 5.9 (*)    All other components within normal limits  LACTIC ACID, PLASMA - Abnormal; Notable for the following components:   Lactic Acid, Venous 5.2 (*)    All other components within normal limits  POCT I-STAT 7, (LYTES, BLD GAS, ICA,H+H) - Abnormal; Notable for the following components:   pH, Arterial 7.226 (*)    pCO2 arterial 55.0 (*)    Acid-base deficit 5.0 (*)    Sodium 129 (*)    All other components within normal limits  TROPONIN I (HIGH SENSITIVITY) - Abnormal; Notable for the following components:   Troponin I (High Sensitivity) 67 (*)    All other components within normal limits  CULTURE, BLOOD (ROUTINE X 2)  CULTURE, BLOOD (ROUTINE X 2)  BLOOD GAS, ARTERIAL  PROTIME-INR  POC SARS CORONAVIRUS 2 AG -  ED  TROPONIN I (HIGH SENSITIVITY)                                                                                                                         EKG  EKG Interpretation  Date/Time:  Thursday July 27 2019 05:55:35 EST Ventricular Rate:  127 PR Interval:    QRS Duration: 130 QT Interval:  328 QTC Calculation: 477 R Axis:   97 Text Interpretation: Atrial fibrillation Nonspecific intraventricular conduction delay Abnormal lateral Q waves Reconfirmed by Addison Lank [id number]) on 07/01/2019 6:30:56 AM      Radiology DG Chest Portable 1 View  Result Date: 07/13/2019 CLINICAL DATA:  Shortness of breath, wheezing, congestion EXAM: PORTABLE CHEST 1 VIEW COMPARISON:  07/08/2019 FINDINGS: Interval placement of 2nd right chest tube with re-expansion of the right lung. Small residual right apical pneumothorax. Diffuse bilateral airspace disease, left greater than right concerning for pneumonia. Heart is mildly enlarged. No effusions or acute bony  abnormality. IMPRESSION: Interval placement of 2nd right chest tube with near complete re-expansion of the right lung. Small residual right apical pneumothorax. Diffuse bilateral airspace disease, left greater than right, unchanged. Electronically Signed   By: Rolm Baptise M.D.   On: 07/13/2019 09:13   DG Chest Portable 1 View  Result Date: 07/14/2019 CLINICAL DATA:  Status post chest tube insertion EXAM: PORTABLE CHEST 1 VIEW COMPARISON:  Earlier today. FINDINGS: Interval placement of right-sided chest tube. Persistent and enlarging right-sided pneumothorax is again noted. Diffuse airspace and interstitial opacities throughout the left lung, right midlung and right lower lobe noted. Normal heart size. Aortic atherosclerosis. IMPRESSION: 1. Enlarging right-sided pneumothorax status post chest tube placement. 2. No change in bilateral interstitial and airspace opacities. Findings concerning for multifocal pneumonia. Followup PA and lateral chest X-ray is recommended in 3-4 weeks following trial of antibiotic therapy to ensure resolution and exclude underlying malignancy. 3. Critical Value/emergent results were called by telephone at the time of interpretation on 07/06/2019 at 8:55 am to [place] , who verbally acknowledged these results. Electronically Signed   By: Kerby Moors M.D.   On: 07/19/2019 08:55   DG Chest Port 1 View  Result Date: 07/26/2019 CLINICAL DATA:  Shortness of breath and wheezing EXAM: PORTABLE CHEST 1 VIEW COMPARISON:  07/04/2011 FINDINGS: Cardiac shadow is mildly enlarged. Aortic calcifications are again seen. Vascular congestion is noted with diffuse increased airspace opacities within the mid and lower lungs bilaterally. This likely represents a component of pulmonary edema although acute inflammatory change could not be totally excluded. A large left-sided pneumothorax is noted not seen on the prior exam. No acute bony abnormality is noted. IMPRESSION: New  right-sided pneumothorax of a moderate degree. Diffuse bilateral airspace opacity likely representing edema or multifocal pneumonia. Critical Value/emergent results were called by telephone at the time of interpretation on 07/26/2019 at 7:17 am to Dr. Addison Lank ,  who verbally acknowledged these results. Electronically Signed   By: Inez Catalina M.D.   On: 07/22/2019 07:18    Pertinent labs & imaging results that were available during my care of the patient were reviewed by me and considered in my medical decision making (see chart for details).  Medications Ordered in ED Medications  albuterol (PROVENTIL,VENTOLIN) solution continuous neb (0 mg/hr Nebulization Stopped 07/25/2019 0755)  cefTRIAXone (ROCEPHIN) 2 g in sodium chloride 0.9 % 100 mL IVPB (0 g Intravenous Stopped 07/20/2019 0830)  azithromycin (ZITHROMAX) 500 mg in sodium chloride 0.9 % 250 mL IVPB (500 mg Intravenous New Bag/Given 07/17/2019 0834)  0.9 %  sodium chloride infusion (has no administration in time range)  magnesium sulfate IVPB 2 g 50 mL (0 g Intravenous Stopped 07/26/2019 0659)  methylPREDNISolone sodium succinate (SOLU-MEDROL) 125 mg/2 mL injection 125 mg (125 mg Intravenous Given 07/23/2019 0263)  ipratropium (ATROVENT) nebulizer solution 0.5 mg (0.5 mg Nebulization Given 07/08/2019 7858)  LORazepam (ATIVAN) injection 1 mg (1 mg Intravenous Given 07/08/2019 0655)  furosemide (LASIX) injection 40 mg (40 mg Intravenous Given 07/03/2019 0811)  0.9 %  sodium chloride infusion (1,000 mLs Intravenous New Bag/Given 07/01/2019 0904)  0.9 %  sodium chloride infusion ( Intravenous New Bag/Given 07/24/2019 0919)                                                                                                                                    Procedures CHEST TUBE INSERTION  Date/Time: 06/30/2019 9:23 AM Performed by: Fatima Blank, MD Authorized by: Fatima Blank, MD   Consent:    Consent obtained:  Verbal   Consent given  by:  Healthcare agent   Risks discussed:  Bleeding, incomplete drainage, infection and damage to surrounding structures   Alternatives discussed:  Alternative treatment Pre-procedure details:    Skin preparation:  ChloraPrep   Preparation: Patient was prepped and draped in the usual sterile fashion   Sedation:    Sedation type:  Anxiolysis Anesthesia (see MAR for exact dosages):    Anesthesia method:  Local infiltration   Local anesthetic:  Lidocaine 1% w/o epi Procedure details:    Placement location:  R lateral   Scalpel size:  11   Tube size (French): pigtail.   Ultrasound guidance: no     Tension pneumothorax: no     Tube connected to:  Suction   Drainage characteristics:  Serosanguinous   Suture material:  0 silk   Dressing:  4x4 sterile gauze Post-procedure details:    Post-insertion x-ray findings: tube in good position     Patient tolerance of procedure:  Tolerated well, no immediate complications Comments:     However lung is not reexpanding CHEST TUBE INSERTION  Date/Time: 07/03/2019 9:25 AM Performed by: Fatima Blank, MD Authorized by: Fatima Blank, MD   Procedure details:    Placement location:  R anterior   Scalpel size:  11  Tube size [place]): pigtail.   Tension pneumothorax: no     Tube connected to:  Heimlich valve and suction   Suture material:  0 silk   Dressing:  4x4 sterile gauze, petrolatum-impregnated gauze and Xeroform gauze Post-procedure details:    Post-insertion x-ray findings: tube in good position     Patient tolerance of procedure:  Tolerated well, no immediate complications Comments:     CXR now with reexpanded lung .Critical Care Performed by: Fatima Blank, MD Authorized by: Fatima Blank, MD    CRITICAL CARE Performed by: Grayce Sessions Rewa Weissberg Total critical care time: 80 minutes Critical care time was exclusive of separately billable procedures and treating other patients. Critical care  was necessary to treat or prevent imminent or life-threatening deterioration. Critical care was time spent personally by me on the following activities: development of treatment plan with patient and/or surrogate as well as nursing, discussions with consultants, evaluation of patient's response to treatment, examination of patient, obtaining history from patient or surrogate, ordering and performing treatments and interventions, ordering and review of laboratory studies, ordering and review of radiographic studies, pulse oximetry and re-evaluation of patient's condition.    (including critical care time)  Medical Decision Making / ED Course I have reviewed the nursing notes for this encounter and the patient's prior records (if available in EHR or on provided paperwork).   [name] was evaluated in Emergency Department on 07/02/2019 for the symptoms described in the history of present illness. She was evaluated in the context of the global COVID-19 pandemic, which necessitated consideration that the patient might be at risk for infection with the SARS-CoV-2 virus that causes COVID-19. Institutional protocols and algorithms that pertain to the evaluation of patients at risk for COVID-19 are in a state of rapid change based on information released by regulatory bodies including the CDC and federal and state organizations. These policies and algorithms were followed during the patient's care in the ED.    Clinical Course as of Jul 26 925  Thu Jul 27, 2019  0701 Patient in respiratory distress, somnolent.  Presentation is concerning for COPD exacerbation requiring immediate BiPAP use.  Patient required chemical sedation for agitation.   I spoke with patient's niece who is her emergency contact who confirmed the patient's DNR/DNI status.  She reported that the patient has been having increasing shortness of breath and cough for the past several weeks and was being treated by of her  pulmonologist.  She was placed on antibiotics couple days ago for possible pneumonia without improvement.     [PC]  [id number] Chest x-ray with evidence of bibasilar opacities concerning for pneumonia vs edema. Also notable for right PTx.  Lactic also notably elevated. Code sepsis initiated and patient started on empiric antibiotics.   [PC]  [phone] Spoke with niece, who reported that patient would be amiable to getting procedures such as chest tube.    [PC]  3557 2nd chest tube inserted as 1st did not reexpand the lung.  Second chest tube has reexpanded the lung.  Saturations are improved.  However patient's blood pressures dropped with systolics in the 32K.  IV fluid boluses will be given to see how she responds.  Will discuss case with critical care to determine need for ICU   [PC]  0925 Spoke with Dr. Tamala Julian for critical care who will evaluate the patient in the emergency department   [PC]    Clinical Course User Index [PC] Cristin Penaflor, Grayce Sessions, MD  Final Clinical Impression(s) / ED Diagnoses Final diagnoses:  SOB (shortness of breath)  Other pneumothorax      This chart was dictated using voice recognition software.  Despite best efforts to proofread,  errors can occur which can change the documentation meaning.   Fatima Blank, MD 06/28/2019 [phone]

## 2019-07-28 DIAGNOSIS — J939 Pneumothorax, unspecified: Secondary | ICD-10-CM

## 2019-07-28 DIAGNOSIS — J441 Chronic obstructive pulmonary disease with (acute) exacerbation: Secondary | ICD-10-CM

## 2019-07-28 NOTE — Consult Note (Signed)
Palliative Care Consult Note Reason for Consultation: terminal care  79 yo woman with multiple chronic end stage medical problems and recurrent lung cancer admitted with AF w/RVR and found to have pneumothorax on 12/31. CCM discussed goals of care in the ED and a decision was made to pursue comfort measures. I met with patient's son at the bedside today. He understands that her current trajectory is terminal and that his mother is approaching EOL. I anticipate a hospital death, she is too unstable to move to a hospice facility.  Recommendations: 1. Comfort Care Orders in place. 2. Morphine infusion at '2mg'$ /hr, '2mg'$  bolus available for uncontrolled symptoms 3. Palliative Prophylaxis 4. Anticipate hospital death 5. Provided grief support to son, given that she is EOL, ok for additional family per [facility] guidelines to visit her.  Lane Hacker, DO Palliative Medicine [phone]  Time: 50 minutes Greater than 50%  of this time was spent counseling and coordinating care related to the above assessment and plan.

## 2019-07-28 NOTE — Progress Notes (Signed)
PROGRESS NOTE    [name]  JHE:[id number] DOB: [birth year] DOA: 07/01/2019 PCP: Jodelle Green, FNP (Confirm with patient/family/NH records and if not entered, this HAS to be entered at [facility] point of entry. 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 Change to hospice status as patient is expected to die and we made her comfort care - will ask Palliative care  DVT prophylaxis: none comfort care Code Status: DNR Family Communication: Niece present julie Shpherd Disposition Plan: anticipate hospital death   Consultants:   Palliative care   Subjective: In comfort care Family reports she is resting w/o problem and intermittently awakens slightly  Objective: Vitals:   07/21/2019 1645 07/11/2019 1701 07/14/2019 1737 07/28/19 0853  BP:  (!) 79/66 97/79 (!) 80/55  Pulse:  (!) 59 78 (!) 113  Resp: (!) 9 (!) 9 10 14   Temp:   98 F (36.7 C) 97.9 F (36.6 C)  TempSrc:      SpO2:  (!) 82% 90% (!) 80%    Intake/Output Summary (Last 24 hours) at 07/28/2019 1856 Last data filed at 07/28/2019  0300 Gross per 24 hour  Intake 26.27 ml  Output --  Net 26.27 ml   There were no vitals filed for this visit.  Examination:  Patient resting in bed - asleep Not arousable to voice RR ok nonlabored Cor S1s2 no rmg    Data Reviewed: I have personally reviewed following labs and imaging studies  CBC: Recent Labs  Lab 07/26/2019 0621 07/12/2019 0715  WBC 11.0*  --   NEUTROABS 7.3  --   HGB 13.5 14.3  HCT 45.1 42.0  MCV 101.3*  --   PLT 223  --    Basic Metabolic Panel: Recent Labs  Lab 07/19/2019 0621 06/27/2019 0715  NA 135 129*  K 4.7 4.7  CL 97*  --   CO2 23  --   GLUCOSE 245*  --   BUN 21  --   CREATININE 1.43*  --   CALCIUM 9.7  --    GFR: CrCl cannot be calculated (Unknown ideal weight.). Liver Function Tests: Recent Labs  Lab 07/24/2019 0621  AST 54*  ALT 33  ALKPHOS 107  BILITOT 1.1  PROT 6.6  ALBUMIN 3.4*   Sepsis Labs: Recent Labs  Lab 06/27/2019 0624 07/14/2019 0805  LATICACIDVEN 5.9* 5.2*    Recent Results (from the past 240 hour(s))  Blood Culture (routine x 2)     Status:  None (Preliminary result)   Collection Time: 06/30/2019  8:03 AM   Specimen: BLOOD  Result Value Ref Range Status   Specimen Description BLOOD LEFT ANTECUBITAL  Final   Special Requests   Final    BOTTLES DRAWN AEROBIC AND ANAEROBIC Blood Culture results may not be optimal due to an inadequate volume of blood received in culture bottles   Culture   Final    NO GROWTH 1 DAY Performed at [facility], [place] [address], [place], [place] [address]    Report Status PENDING  Incomplete  Blood Culture (routine x 2)     Status: None (Preliminary result)   Collection Time: 07/15/2019  8:05 AM   Specimen: BLOOD RIGHT FOREARM  Result Value Ref Range Status   Specimen Description BLOOD RIGHT FOREARM  Final   Special Requests   Final    BOTTLES DRAWN AEROBIC ONLY Blood Culture results may not be optimal due to an inadequate volume of blood received in culture bottles   Culture    Final    NO GROWTH 1 DAY Performed at [facility], [place] [address], [place], [place] [address]    Report Status PENDING  Incomplete  Respiratory Panel by RT PCR (Flu A&B, Covid) - Nasopharyngeal Swab     Status: None   Collection Time: 07/01/2019 10:37 AM   Specimen: Nasopharyngeal Swab  Result Value Ref Range Status   SARS Coronavirus 2 by RT PCR NEGATIVE NEGATIVE Final    Comment: (NOTE) SARS-CoV-2 target nucleic acids are NOT DETECTED. The SARS-CoV-2 RNA is generally detectable in upper respiratoy specimens during the acute phase of infection. The lowest concentration of SARS-CoV-2 viral copies this assay can detect is 131 copies/mL. A negative result does not preclude SARS-Cov-2 infection and should not be used as the sole basis for treatment or other patient management decisions. A negative result may occur with  improper specimen collection/handling, submission of specimen other than nasopharyngeal swab, presence of viral mutation(s) within the areas targeted by this assay, and inadequate number of viral copies (<131 copies/mL). A negative result must be combined with clinical observations, patient history, and epidemiological information. The expected result is Negative. Fact Sheet for Patients:  PinkCheek.be Fact Sheet for Healthcare Providers:  GravelBags.it This test is not yet ap proved or cleared by the [place] FDA and  has been authorized for detection and/or diagnosis of SARS-CoV-2 by FDA under an Emergency Use Authorization (EUA). This EUA will remain  in effect (meaning this test can be used) for the duration of the COVID-19 declaration under Section 564(b)(1) of the Act, 21 U.S.C. section 360bbb-3(b)(1), unless the authorization is terminated or revoked sooner.    Influenza A by PCR NEGATIVE NEGATIVE Final   Influenza B by PCR NEGATIVE NEGATIVE Final    Comment: (NOTE) The Xpert Xpress  SARS-CoV-2/FLU/RSV assay is intended as an aid in  the diagnosis of influenza from Nasopharyngeal swab specimens and  should not be used as a sole basis for treatment. Nasal washings and  aspirates are unacceptable for Xpert Xpress SARS-CoV-2/FLU/RSV  testing. Fact Sheet for Patients: PinkCheek.be Fact Sheet for Healthcare Providers: GravelBags.it This test is not yet approved or cleared by the [place] FDA and  has been authorized for detection and/or diagnosis of SARS-CoV-2 by  FDA under an Emergency Use Authorization (EUA). This EUA will remain  in effect (meaning this test can be used) for the duration of the  Covid-19 declaration under Section 564(b)(1) of the Act,  21  U.S.C. section 360bbb-3(b)(1), unless the authorization is  terminated or revoked. Performed at [facility], [place] [address] [place], [place] [address]          Radiology Studies: DG Chest Portable 1 View  Result Date: 07/19/2019 CLINICAL DATA:  Shortness of breath, wheezing, congestion EXAM: PORTABLE CHEST 1 VIEW COMPARISON:  07/22/2019 FINDINGS: Interval placement of 2nd right chest tube with re-expansion of the right lung. Small residual right apical pneumothorax. Diffuse bilateral airspace disease, left greater than right concerning for pneumonia. Heart is mildly enlarged. No effusions or acute bony abnormality. IMPRESSION: Interval placement of 2nd right chest tube with near complete re-expansion of the right lung. Small residual right apical pneumothorax. Diffuse bilateral airspace disease, left greater than right, unchanged. Electronically Signed   By: Rolm Baptise M.D.   On: 07/01/2019 09:13   DG Chest Portable 1 View  Result Date: 06/28/2019 CLINICAL DATA:  Status post chest tube insertion EXAM: PORTABLE CHEST 1 VIEW COMPARISON:  Earlier today. FINDINGS: Interval placement of right-sided chest tube. Persistent and enlarging right-sided  pneumothorax is again noted. Diffuse airspace and interstitial opacities throughout the left lung, right midlung and right lower lobe noted. Normal heart size. Aortic atherosclerosis. IMPRESSION: 1. Enlarging right-sided pneumothorax status post chest tube placement. 2. No change in bilateral interstitial and airspace opacities. Findings concerning for multifocal pneumonia. Followup PA and lateral chest X-ray is recommended in 3-4 weeks following trial of antibiotic therapy to ensure resolution and exclude underlying malignancy. 3. Critical Value/emergent results were called by telephone at the time of interpretation on 06/27/2019 at 8:55 am to [place] , who verbally acknowledged these results. Electronically Signed   By: Kerby Moors M.D.   On: 07/01/2019 08:55   DG Chest Port 1 View  Result Date: 07/17/2019 CLINICAL DATA:  Shortness of breath and wheezing EXAM: PORTABLE CHEST 1 VIEW COMPARISON:  07/04/2011 FINDINGS: Cardiac shadow is mildly enlarged. Aortic calcifications are again seen. Vascular congestion is noted with diffuse increased airspace opacities within the mid and lower lungs bilaterally. This likely represents a component of pulmonary edema although acute inflammatory change could not be totally excluded. A large left-sided pneumothorax is noted not seen on the prior exam. No acute bony abnormality is noted. IMPRESSION: New right-sided pneumothorax of a moderate degree. Diffuse bilateral airspace opacity likely representing edema or multifocal pneumonia. Critical Value/emergent results were called by telephone at the time of interpretation on 07/06/2019 at 7:17 am to Dr. Addison Lank , who verbally acknowledged these results. Electronically Signed   By: Inez Catalina M.D.   On: 07/03/2019 07:18        Scheduled Meds: Continuous Infusions: . morphine 1 mg/hr (07/26/2019 1042)     LOS: 1 day        Silvano Rusk, MD [facility] Pager [phone]  If  7PM-7AM, please contact night-coverage [email or web address] Password [facility] 07/28/2019, 6:56 PM

## 2019-07-28 DEATH — deceased

## 2019-07-29 DIAGNOSIS — I5043 Acute on chronic combined systolic (congestive) and diastolic (congestive) heart failure: Secondary | ICD-10-CM

## 2019-07-29 DIAGNOSIS — I480 Paroxysmal atrial fibrillation: Secondary | ICD-10-CM

## 2019-07-29 DIAGNOSIS — C3431 Malignant neoplasm of lower lobe, right bronchus or lung: Secondary | ICD-10-CM

## 2019-07-29 DIAGNOSIS — N1832 Chronic kidney disease, stage 3b: Secondary | ICD-10-CM

## 2019-07-29 NOTE — Progress Notes (Signed)
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 2 chest tubes placed in ED and comfort care plan decided   Assessment & Plan:   Active Problems:   Essential hypertension   Acute on chronic combined systolic and diastolic CHF (congestive heart failure) (HCC)   Cancer of lower lobe of right lung (HCC)   CKD (chronic kidney disease) stage 3, GFR 30-59 ml/min (HCC)   COPD with acute exacerbation (HCC)   DNR (do not resuscitate)   AF (paroxysmal atrial fibrillation) ([place])   Pneumothorax   Acute on chronic respiratory failure with hypoxia and hypercapnia (HCC)   DNI (do not intubate)   Comfort measures only status    RIGHT PTX  - tx w/ chest tubes  COPD EXCACERBATION  AFIB RVR  -improved   COMFORT CARE  - pain control + sedation  morphine drip, anxiolytics - allow visitation - ? Change to hospice status as patient is expected to die and we made her comfort care - will ask Palliative care  DVT prophylaxis: None:Comfort Care  Code Status: dnr    Code Status Orders  (From admission, onward)         Start     Ordered   07/07/2019 1013  Do not attempt resuscitation (DNR)  Continuous    Question Answer Comment  In the event of cardiac or respiratory ARREST Do not call a "code blue"   In the event of cardiac or respiratory ARREST Do not perform Intubation, CPR, defibrillation or ACLS   In the event of cardiac or respiratory ARREST Use medication by any route, position, wound care, and other measures to relive pain and suffering. May use oxygen, suction and manual treatment of airway obstruction as needed for comfort.      07/02/2019 1023        Code Status History    Date Active Date Inactive Code Status Order ID Comments User Context   07/26/2019 2992 07/06/2019 1023 DNR [id number]  [place],  Grace Bushy, NP ED   03/18/2018 1210 03/21/2018 1919 DNR [id number]  Norval Morton, MD ED   03/18/2018 1101 03/18/2018 1210 Full Code [id number]  Norval Morton, MD ED   02/24/2018 1745 02/27/2018 1930 DNR [id number]  Karmen Bongo, MD Inpatient   02/24/2018 1733 02/24/2018 1745 Full Code [id number]  Karmen Bongo, MD Inpatient   07/07/2017 0122 07/09/2017 2332 Full Code [id number]  Lorretta Harp, MD Inpatient   07/07/2017 0122 07/07/2017 0122 Full Code [id number]  Johna Sheriff, MD Inpatient   07/08/2016 1310 07/10/2016 1523 Full Code [id number]  Cheryln Manly, NP Inpatient   04/05/2016 0737 04/06/2016 2128 Full Code [id number]  Rondel Jumbo, PA-C ED   04/05/2016 0737 04/05/2016 0737 Full Code [id number]  Rondel Jumbo, PA-C ED   03/19/2016 0127 03/21/2016 1844 Full Code [id number]  Rise Patience, MD Inpatient   02/24/2016 0501 02/26/2016 1929 Full Code [id number]  Belva Crome, MD Inpatient   Advance Care Planning Activity     Family Communication: Discussed with family at bedside Disposition Plan:   End-of-life care palliative care involved anticipate in-hospital death Consults called: None Admission status: Inpatient   Consultants:   Palliative care  Procedures:  DG Chest 2 View  Result Date: 07/04/2019 CLINICAL DATA:  COPD EXAM: CHEST - 2  VIEW COMPARISON:  Chest radiograph, 03/18/2018, CT chest, 07/18/2018 FINDINGS: The heart size and mediastinal contours are within normal limits. Emphysema. There are irregular, masslike opacities of the posterior right lower lobe and superior segment left lower lobe, which are not appreciably changed in comparison to their appearance on CT examination dated 07/18/2018. These were not well noted on prior radiograph dated 03/18/2018. The visualized skeletal structures are unremarkable. IMPRESSION: 1. Irregular, masslike opacities of the posterior right lower lobe and superior segment left lower lobe, not appreciably changed in comparison to CT dated  07/18/2018. Consider CT to better evaluate within modality for long-term stability. 2. Emphysema. Electronically Signed   By: Eddie Candle M.D.   On: 07/04/2019 09:54   DG Chest Portable 1 View  Result Date: 07/02/2019 CLINICAL DATA:  Shortness of breath, wheezing, congestion EXAM: PORTABLE CHEST 1 VIEW COMPARISON:  07/06/2019 FINDINGS: Interval placement of 2nd right chest tube with re-expansion of the right lung. Small residual right apical pneumothorax. Diffuse bilateral airspace disease, left greater than right concerning for pneumonia. Heart is mildly enlarged. No effusions or acute bony abnormality. IMPRESSION: Interval placement of 2nd right chest tube with near complete re-expansion of the right lung. Small residual right apical pneumothorax. Diffuse bilateral airspace disease, left greater than right, unchanged. Electronically Signed   By: Rolm Baptise M.D.   On: 07/21/2019 09:13   DG Chest Portable 1 View  Result Date: 07/12/2019 CLINICAL DATA:  Status post chest tube insertion EXAM: PORTABLE CHEST 1 VIEW COMPARISON:  Earlier today. FINDINGS: Interval placement of right-sided chest tube. Persistent and enlarging right-sided pneumothorax is again noted. Diffuse airspace and interstitial opacities throughout the left lung, right midlung and right lower lobe noted. Normal heart size. Aortic atherosclerosis. IMPRESSION: 1. Enlarging right-sided pneumothorax status post chest tube placement. 2. No change in bilateral interstitial and airspace opacities. Findings concerning for multifocal pneumonia. Followup PA and lateral chest X-ray is recommended in 3-4 weeks following trial of antibiotic therapy to ensure resolution and exclude underlying malignancy. 3. Critical Value/emergent results were called by telephone at the time of interpretation on 07/05/2019 at 8:55 am to [place] , who verbally acknowledged these results. Electronically Signed   By: Kerby Moors M.D.   On: 07/02/2019  08:55   DG Chest Port 1 View  Result Date: 07/18/2019 CLINICAL DATA:  Shortness of breath and wheezing EXAM: PORTABLE CHEST 1 VIEW COMPARISON:  07/04/2011 FINDINGS: Cardiac shadow is mildly enlarged. Aortic calcifications are again seen. Vascular congestion is noted with diffuse increased airspace opacities within the mid and lower lungs bilaterally. This likely represents a component of pulmonary edema although acute inflammatory change could not be totally excluded. A large left-sided pneumothorax is noted not seen on the prior exam. No acute bony abnormality is noted. IMPRESSION: New right-sided pneumothorax of a moderate degree. Diffuse bilateral airspace opacity likely representing edema or multifocal pneumonia. Critical Value/emergent results were called by telephone at the time of interpretation on 07/10/2019 at 7:17 am to Dr. Addison Lank , who verbally acknowledged these results. Electronically Signed   By: Inez Catalina M.D.   On: 07/17/2019 07:18     Antimicrobials:   None   Subjective: And comfort care morphine drip resting comfortably, family at bedside  Objective: Vitals:   06/30/2019 1701 07/09/2019 1737 07/28/19 0853 07/28/19 2245  BP: (!) 79/66 97/79 (!) 80/55 (!) 105/53  Pulse: (!) 59 78 (!) 113 (!) 116  Resp: (!) 9 10 14 20   Temp:  98 F (36.7 C)  97.9 F (36.6 C) 100 F (37.8 C)  TempSrc:    Oral  SpO2: (!) 82% 90% (!) 80% 92%   No intake or output data in the 24 hours ending 07/29/19 1228 There were no vitals filed for this visit.  Examination:  General resting in bed asleep Respiratory respiratory rate unlabored Cardiovascular regular rate and rhythm Abdomen, benign Extremities thin warm   Data Reviewed: I have personally reviewed following labs and imaging studies  CBC: Recent Labs  Lab 07/10/2019 0621 07/16/2019 0715  WBC 11.0*  --   NEUTROABS 7.3  --   HGB 13.5 14.3  HCT 45.1 42.0  MCV 101.3*  --   PLT 223  --    Basic Metabolic Panel: Recent  Labs  Lab 07/20/2019 0621 07/06/2019 0715  NA 135 129*  K 4.7 4.7  CL 97*  --   CO2 23  --   GLUCOSE 245*  --   BUN 21  --   CREATININE 1.43*  --   CALCIUM 9.7  --    GFR: CrCl cannot be calculated (Unknown ideal weight.). Liver Function Tests: Recent Labs  Lab 07/11/2019 0621  AST 54*  ALT 33  ALKPHOS 107  BILITOT 1.1  PROT 6.6  ALBUMIN 3.4*   No results for input(s): LIPASE, AMYLASE in the last 168 hours. No results for input(s): AMMONIA in the last 168 hours. Coagulation Profile: No results for input(s): INR, PROTIME in the last 168 hours. Cardiac Enzymes: No results for input(s): CKTOTAL, CKMB, CKMBINDEX, TROPONINI in the last 168 hours. BNP (last 3 results) No results for input(s): PROBNP in the last 8760 hours. HbA1C: No results for input(s): HGBA1C in the last 72 hours. CBG: No results for input(s): GLUCAP in the last 168 hours. Lipid Profile: No results for input(s): CHOL, HDL, LDLCALC, TRIG, CHOLHDL, LDLDIRECT in the last 72 hours. Thyroid Function Tests: No results for input(s): TSH, T4TOTAL, FREET4, T3FREE, THYROIDAB in the last 72 hours. Anemia Panel: No results for input(s): VITAMINB12, FOLATE, FERRITIN, TIBC, IRON, RETICCTPCT in the last 72 hours. Sepsis Labs: Recent Labs  Lab 06/28/2019 1950 07/26/2019 0805  LATICACIDVEN 5.9* 5.2*    Recent Results (from the past 240 hour(s))  Blood Culture (routine x 2)     Status: None (Preliminary result)   Collection Time: 07/17/2019  8:03 AM   Specimen: BLOOD  Result Value Ref Range Status   Specimen Description BLOOD LEFT ANTECUBITAL  Final   Special Requests   Final    BOTTLES DRAWN AEROBIC AND ANAEROBIC Blood Culture results may not be optimal due to an inadequate volume of blood received in culture bottles   Culture   Final    NO GROWTH 2 DAYS Performed at [place] [address], [place], [place] [address]    Report Status PENDING  Incomplete  Blood Culture (routine x 2)     Status: None  (Preliminary result)   Collection Time: 07/08/2019  8:05 AM   Specimen: BLOOD RIGHT FOREARM  Result Value Ref Range Status   Specimen Description BLOOD RIGHT FOREARM  Final   Special Requests   Final    BOTTLES DRAWN AEROBIC ONLY Blood Culture results may not be optimal due to an inadequate volume of blood received in culture bottles   Culture   Final    NO GROWTH 2 DAYS Performed at [facility], [place] [address], [place], [place] [address]    Report Status PENDING  Incomplete  Respiratory Panel by  RT PCR (Flu A&B, Covid) - Nasopharyngeal Swab     Status: None   Collection Time: 07/20/2019 10:37 AM   Specimen: Nasopharyngeal Swab  Result Value Ref Range Status   SARS Coronavirus 2 by RT PCR NEGATIVE NEGATIVE Final    Comment: (NOTE) SARS-CoV-2 target nucleic acids are NOT DETECTED. The SARS-CoV-2 RNA is generally detectable in upper respiratoy specimens during the acute phase of infection. The lowest concentration of SARS-CoV-2 viral copies this assay can detect is 131 copies/mL. A negative result does not preclude SARS-Cov-2 infection and should not be used as the sole basis for treatment or other patient management decisions. A negative result may occur with  improper specimen collection/handling, submission of specimen other than nasopharyngeal swab, presence of viral mutation(s) within the areas targeted by this assay, and inadequate number of viral copies (<131 copies/mL). A negative result must be combined with clinical observations, patient history, and epidemiological information. The expected result is Negative. Fact Sheet for Patients:  PinkCheek.be Fact Sheet for Healthcare Providers:  GravelBags.it This test is not yet ap proved or cleared by the [place] FDA and  has been authorized for detection and/or diagnosis of SARS-CoV-2 by FDA under an Emergency Use Authorization (EUA). This EUA will remain  in  effect (meaning this test can be used) for the duration of the COVID-19 declaration under Section 564(b)(1) of the Act, 21 U.S.C. section 360bbb-3(b)(1), unless the authorization is terminated or revoked sooner.    Influenza A by PCR NEGATIVE NEGATIVE Final   Influenza B by PCR NEGATIVE NEGATIVE Final    Comment: (NOTE) The Xpert Xpress SARS-CoV-2/FLU/RSV assay is intended as an aid in  the diagnosis of influenza from Nasopharyngeal swab specimens and  should not be used as a sole basis for treatment. Nasal washings and  aspirates are unacceptable for Xpert Xpress SARS-CoV-2/FLU/RSV  testing. Fact Sheet for Patients: PinkCheek.be Fact Sheet for Healthcare Providers: GravelBags.it This test is not yet approved or cleared by the [place] FDA and  has been authorized for detection and/or diagnosis of SARS-CoV-2 by  FDA under an Emergency Use Authorization (EUA). This EUA will remain  in effect (meaning this test can be used) for the duration of the  Covid-19 declaration under Section 564(b)(1) of the Act, 21  U.S.C. section 360bbb-3(b)(1), unless the authorization is  terminated or revoked. Performed at [facility], [place] [address] [place], [place] [address]          Radiology Studies: No results found.      Scheduled Meds: Continuous Infusions: . morphine 2 mg/hr (07/29/19 0831)     LOS: 2 days    Time spent: 88 min    Nicolette Bang, MD [facility]  If 7PM-7AM, please contact night-coverage  07/29/2019, 12:28 PM

## 2019-07-30 DIAGNOSIS — J9622 Acute and chronic respiratory failure with hypercapnia: Secondary | ICD-10-CM

## 2019-07-30 DIAGNOSIS — J9621 Acute and chronic respiratory failure with hypoxia: Secondary | ICD-10-CM

## 2019-08-01 LAB — CULTURE, BLOOD (ROUTINE X 2)
Culture: NO GROWTH
Culture: NO GROWTH

## 2019-08-02 ENCOUNTER — Ambulatory Visit: Payer: PPO

## 2019-08-28 NOTE — Plan of Care (Signed)
Pt expired at [facility] Aug 07, 2019 .  Problem: Education: Goal: Knowledge of General Education information will improve Description: Including pain rating scale, medication(s)/side effects and non-pharmacologic comfort measures 08-07-19 0230 by Hillary Bow, RN Outcome: Adequate for Discharge 2019/08/07 0230 by Hillary Bow, RN Outcome: Adequate for Discharge   Problem: Health Behavior/Discharge Planning: Goal: Ability to manage health-related needs will improve 2019/08/07 0230 by Hillary Bow, RN Outcome: Adequate for Discharge 2019/08/07 0230 by Hillary Bow, RN Outcome: Adequate for Discharge   Problem: Clinical Measurements: Goal: Ability to maintain clinical measurements within normal limits will improve 08-07-19 0230 by Hillary Bow, RN Outcome: Adequate for Discharge 2019-08-07 0230 by Hillary Bow, RN Outcome: Adequate for Discharge Goal: Will remain free from infection 08-07-2019 0230 by Hillary Bow, RN Outcome: Adequate for Discharge 08/07/19 0230 by Hillary Bow, RN Outcome: Adequate for Discharge Goal: Diagnostic test results will improve Aug 07, 2019 0230 by Hillary Bow, RN Outcome: Adequate for Discharge August 07, 2019 0230 by Hillary Bow, RN Outcome: Adequate for Discharge Goal: Respiratory complications will improve 08-07-19 0230 by Hillary Bow, RN Outcome: Adequate for Discharge 2019/08/07 0230 by Hillary Bow, RN Outcome: Adequate for Discharge Goal: Cardiovascular complication will be avoided 2019-08-07 0230 by Hillary Bow, RN Outcome: Adequate for Discharge 2019-08-07 0230 by Hillary Bow, RN Outcome: Adequate for Discharge   Problem: Activity: Goal: Risk for activity intolerance will decrease 08/07/19 0230 by Hillary Bow, RN Outcome: Adequate for Discharge 08/07/19 0230 by Hillary Bow, RN Outcome: Adequate for Discharge   Problem: Nutrition: Goal: Adequate nutrition will be maintained 2019/08/07 0230 by Hillary Bow, RN Outcome: Adequate for Discharge 08-07-19 0230  by Hillary Bow, RN Outcome: Adequate for Discharge   Problem: Coping: Goal: Level of anxiety will decrease 08-07-2019 0230 by Hillary Bow, RN Outcome: Adequate for Discharge 07-Aug-2019 0230 by Hillary Bow, RN Outcome: Adequate for Discharge   Problem: Elimination: Goal: Will not experience complications related to bowel motility 08-07-19 0230 by Hillary Bow, RN Outcome: Adequate for Discharge 08-07-19 0230 by Hillary Bow, RN Outcome: Adequate for Discharge Goal: Will not experience complications related to urinary retention 08-07-2019 0230 by Hillary Bow, RN Outcome: Adequate for Discharge 2019/08/07 0230 by Hillary Bow, RN Outcome: Adequate for Discharge   Problem: Pain Managment: Goal: General experience of comfort will improve 2019/08/07 0230 by Hillary Bow, RN Outcome: Adequate for Discharge 08-07-2019 0230 by Hillary Bow, RN Outcome: Adequate for Discharge   Problem: Safety: Goal: Ability to remain free from injury will improve 08-07-2019 0230 by Hillary Bow, RN Outcome: Adequate for Discharge August 07, 2019 0230 by Hillary Bow, RN Outcome: Adequate for Discharge   Problem: Skin Integrity: Goal: Risk for impaired skin integrity will decrease 08/07/2019 0230 by Hillary Bow, RN Outcome: Adequate for Discharge 2019-08-07 0230 by Hillary Bow, RN Outcome: Adequate for Discharge

## 2019-08-28 NOTE — Progress Notes (Signed)
Pt was assessed at [place], pt has agonal breathing, non reactive to normal stimuli, responds to pain, chest tube noted and drainage. Son [name]) at bedside. At [place], patient's son noticed the patient not breathing. Focused assessment done, no heart beat noted, no breathing noted,. Charge RN Estill Bamberg informed and she also noted no heartbeat or breathing. Pt expired and X. [facility] paged and informed of the expiration of the patient.

## 2019-08-28 NOTE — Discharge Summary (Signed)
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 2 chest tubes placed in ED and comfort care plan decided  Pt was assessed at 0045, pt has agonal breathing, non reactive to normal stimuli, responds to pain, chest tube noted and drainage. Son [name]) at bedside. At [place], patient's son noticed the patient not breathing. Focused assessment done, no heart beat noted, no breathing noted,. Charge RN Estill Bamberg informed and she also noted no heartbeat or breathing. Pt expired and X. [facility] paged and informed of the expiration of the patient.  Discharge Diagnoses:  Active Problems:   Essential hypertension   Acute on chronic combined systolic and diastolic CHF (congestive heart failure) (HCC)   Cancer of lower lobe of right lung (HCC)   CKD (chronic kidney disease) stage 3, GFR 30-59 ml/min (HCC)   COPD with acute exacerbation (HCC)   DNR (do not resuscitate)   AF (paroxysmal atrial fibrillation) ([place])   Pneumothorax   Acute on chronic respiratory failure with hypoxia and hypercapnia ([place])   DNI (do not intubate)   Comfort measures only status    Discharge Instructions   Allergies as of 15-Aug-2019      Reactions   Actonel [risedronate Sodium] Other (See Comments)   Headache   Amlodipine Other (See Comments)   Pedal edema   Brilinta [ticagrelor] Other (See Comments)   Worsening dyspnea, malaise   Codeine Rash   Fosamax [alendronate Sodium] Other (See Comments)   Unable to tolerate   Lisinopril Cough   Pravastatin Other (See Comments)   Constipation.   Prednisone Other (See Comments)   Shaking (PILLS) Shot is ok      Medication List    ASK your doctor about these medications    AeroChamber MV inhaler Use as instructed   albuterol 108 (90 Base) MCG/ACT inhaler Commonly known as: ProAir HFA Inhale 2 puffs into the lungs every 4 (four) hours as needed for wheezing or shortness of breath.   albuterol (2.5 MG/3ML) 0.083% nebulizer solution Commonly known as: PROVENTIL Take 3 mLs (2.5 mg total) by nebulization every 4 (four) hours as needed for wheezing or shortness of breath.   aspirin EC 81 MG tablet Take 1 tablet (81 mg total) by mouth daily.   budesonide 0.5 MG/2ML nebulizer solution Commonly known as: Pulmicort Take 2 mLs (0.5 mg total) by nebulization 2 (two) times daily.   calcium carbonate 1250 (500 Ca) MG tablet Commonly known as: OS-CAL - dosed in mg of elemental calcium Take 1 tablet by mouth 2 (two) times daily with a meal.   cholecalciferol 1000 units tablet Commonly known as: VITAMIN D Take 1,000 Units by mouth daily.   clopidogrel 75 MG tablet Commonly known as: PLAVIX TAKE 1 TABLET BY MOUTH ONCE DAILY      **BLOOD THINNER**   denosumab 60 MG/ML Soln injection Commonly known as: PROLIA Inject 60 mg into the skin every 6 (six) months. Administer in upper arm, thigh, or abdomen   fluticasone 50 MCG/ACT nasal spray Commonly known as: FLONASE INSTILL 2 SPRAYS INTO BOTH NOSTRILS ONCEDAILY AS DIRECTED   furosemide 20 MG tablet Commonly known as: LASIX TAKE 1 TABLET BY MOUTH ONCE DAILY. MAY TAKE AN ADDITIONAL 1/2 TABLET IF NEEDED FOR SWELLING  ipratropium-albuterol 0.5-2.5 (3) MG/3ML Soln Commonly known as: DUONEB INHALE 1 VIAL VIA NEBULIZER FOUR TIMES ADAY OR EVERY 4 HOURS AS NEEDED AS DIRECTED.   isosorbide mononitrate 30 MG 24 hr tablet Commonly known as: IMDUR TAKE 1 TABLET BY MOUTH ONCE A DAY   loratadine 10 MG tablet Commonly known as: CLARITIN Take 10 mg by mouth daily as needed for allergies or rhinitis.   nitroGLYCERIN 0.4 MG SL tablet Commonly known as: NITROSTAT Place 1 tablet (0.4 mg total) under the tongue every 5  (five) minutes as needed for chest pain.   OXYGEN Inhale 2 L into the lungs.   pantoprazole 40 MG tablet Commonly known as: Protonix Take 1 tablet (40 mg total) by mouth 2 (two) times daily.   prednisoLONE acetate 1 % ophthalmic suspension Commonly known as: PRED FORTE Place 1 drop into both eyes daily.   simvastatin 40 MG tablet Commonly known as: ZOCOR Take 1 tablet (40 mg total) by mouth at bedtime.   spironolactone 25 MG tablet Commonly known as: ALDACTONE TAKE 1/2 TABLET BY MOUTH DAILY       Allergies  Allergen Reactions  . Actonel [Risedronate Sodium] Other (See Comments)    Headache  . Amlodipine Other (See Comments)    Pedal edema  . Brilinta [Ticagrelor] Other (See Comments)    Worsening dyspnea, malaise  . Codeine Rash  . Fosamax [Alendronate Sodium] Other (See Comments)    Unable to tolerate  . Lisinopril Cough  . Pravastatin Other (See Comments)    Constipation.  . Prednisone Other (See Comments)    Shaking (PILLS) Shot is ok    Consultations:  palliative care, pccm   Procedures/Studies: DG Chest Portable 1 View  Result Date: 07/12/2019 CLINICAL DATA:  Shortness of breath, wheezing, congestion EXAM: PORTABLE CHEST 1 VIEW COMPARISON:  07/23/2019 FINDINGS: Interval placement of 2nd right chest tube with re-expansion of the right lung. Small residual right apical pneumothorax. Diffuse bilateral airspace disease, left greater than right concerning for pneumonia. Heart is mildly enlarged. No effusions or acute bony abnormality. IMPRESSION: Interval placement of 2nd right chest tube with near complete re-expansion of the right lung. Small residual right apical pneumothorax. Diffuse bilateral airspace disease, left greater than right, unchanged. Electronically Signed   By: Rolm Baptise M.D.   On: 07/15/2019 09:13   DG Chest Portable 1 View  Result Date: 06/27/2019 CLINICAL DATA:  Status post chest tube insertion EXAM: PORTABLE CHEST 1 VIEW COMPARISON:   Earlier today. FINDINGS: Interval placement of right-sided chest tube. Persistent and enlarging right-sided pneumothorax is again noted. Diffuse airspace and interstitial opacities throughout the left lung, right midlung and right lower lobe noted. Normal heart size. Aortic atherosclerosis. IMPRESSION: 1. Enlarging right-sided pneumothorax status post chest tube placement. 2. No change in bilateral interstitial and airspace opacities. Findings concerning for multifocal pneumonia. Followup PA and lateral chest X-ray is recommended in 3-4 weeks following trial of antibiotic therapy to ensure resolution and exclude underlying malignancy. 3. Critical Value/emergent results were called by telephone at the time of interpretation on 07/11/2019 at 8:55 am to [place] , who verbally acknowledged these results. Electronically Signed   By: Kerby Moors M.D.   On: 07/13/2019 08:55   DG Chest Port 1 View  Result Date: 06/27/2019 CLINICAL DATA:  Shortness of breath and wheezing EXAM: PORTABLE CHEST 1 VIEW COMPARISON:  07/04/2011 FINDINGS: Cardiac shadow is mildly enlarged. Aortic calcifications are again seen. Vascular congestion is noted with diffuse increased airspace opacities within the mid  and lower lungs bilaterally. This likely represents a component of pulmonary edema although acute inflammatory change could not be totally excluded. A large left-sided pneumothorax is noted not seen on the prior exam. No acute bony abnormality is noted. IMPRESSION: New right-sided pneumothorax of a moderate degree. Diffuse bilateral airspace opacity likely representing edema or multifocal pneumonia. Critical Value/emergent results were called by telephone at the time of interpretation on 07/07/2019 at 7:17 am to Dr. Addison Lank , who verbally acknowledged these results. Electronically Signed   By: Inez Catalina M.D.   On: 07/02/2019 07:18       Subjective: Pt was dnr, passed peacefuly surrounded by  family  Discharge Exam: Vitals:   07/29/19 2123 Aug 19, 2019 0045  BP: (!) 114/52   Pulse: (!) 106   Resp: 20   Temp: (!) 102.1 F (38.9 C) (!) 102.8 F (39.3 C)  SpO2: 92%    Vitals:   07/29/19 2123 2019-08-19 0045 Aug 19, 2019 0150 August 19, 2019 0205  BP: (!) 114/52     Pulse: (!) 106     Resp: 20     Temp: (!) 102.1 F (38.9 C) (!) 102.8 F (39.3 C)    TempSrc: Axillary Axillary    SpO2: 92%     Weight:   58.1 kg   Height:    5' 5.75" (1.67 m)        The results of significant diagnostics from this hospitalization (including imaging, microbiology, ancillary and laboratory) are listed below for reference.     Microbiology: Recent Results (from the past 240 hour(s))  Blood Culture (routine x 2)     Status: None   Collection Time: 07/03/2019  8:03 AM   Specimen: BLOOD  Result Value Ref Range Status   Specimen Description BLOOD LEFT ANTECUBITAL  Final   Special Requests   Final    BOTTLES DRAWN AEROBIC AND ANAEROBIC Blood Culture results may not be optimal due to an inadequate volume of blood received in culture bottles   Culture   Final    NO GROWTH 5 DAYS Performed at [facility], [place] [address] [place], [place] [address]    Report Status 08/01/2019 FINAL  Final  Blood Culture (routine x 2)     Status: None   Collection Time: 06/27/2019  8:05 AM   Specimen: BLOOD RIGHT FOREARM  Result Value Ref Range Status   Specimen Description BLOOD RIGHT FOREARM  Final   Special Requests   Final    BOTTLES DRAWN AEROBIC ONLY Blood Culture results may not be optimal due to an inadequate volume of blood received in culture bottles   Culture   Final    NO GROWTH 5 DAYS Performed at [facility], [place] [address], [place],  [address]    Report Status 08/01/2019 FINAL  Final  Respiratory Panel by RT PCR (Flu A&B, Covid) - Nasopharyngeal Swab     Status: None   Collection Time: 06/27/2019 10:37 AM   Specimen: Nasopharyngeal Swab  Result Value Ref Range Status   SARS  Coronavirus 2 by RT PCR NEGATIVE NEGATIVE Final    Comment: (NOTE) SARS-CoV-2 target nucleic acids are NOT DETECTED. The SARS-CoV-2 RNA is generally detectable in upper respiratoy specimens during the acute phase of infection. The lowest concentration of SARS-CoV-2 viral copies this assay can detect is 131 copies/mL. A negative result does not preclude SARS-Cov-2 infection and should not be used as the sole basis for treatment or other patient management decisions. A negative result may  occur with  improper specimen collection/handling, submission of specimen other than nasopharyngeal swab, presence of viral mutation(s) within the areas targeted by this assay, and inadequate number of viral copies (<131 copies/mL). A negative result must be combined with clinical observations, patient history, and epidemiological information. The expected result is Negative. Fact Sheet for Patients:  PinkCheek.be Fact Sheet for Healthcare Providers:  GravelBags.it This test is not yet ap proved or cleared by the [place] FDA and  has been authorized for detection and/or diagnosis of SARS-CoV-2 by FDA under an Emergency Use Authorization (EUA). This EUA will remain  in effect (meaning this test can be used) for the duration of the COVID-19 declaration under Section 564(b)(1) of the Act, 21 U.S.C. section 360bbb-3(b)(1), unless the authorization is terminated or revoked sooner.    Influenza A by PCR NEGATIVE NEGATIVE Final   Influenza B by PCR NEGATIVE NEGATIVE Final    Comment: (NOTE) The Xpert Xpress SARS-CoV-2/FLU/RSV assay is intended as an aid in  the diagnosis of influenza from Nasopharyngeal swab specimens and  should not be used as a sole basis for treatment. Nasal washings and  aspirates are unacceptable for Xpert Xpress SARS-CoV-2/FLU/RSV  testing. Fact Sheet for Patients: PinkCheek.be Fact Sheet  for Healthcare Providers: GravelBags.it This test is not yet approved or cleared by the [place] FDA and  has been authorized for detection and/or diagnosis of SARS-CoV-2 by  FDA under an Emergency Use Authorization (EUA). This EUA will remain  in effect (meaning this test can be used) for the duration of the  Covid-19 declaration under Section 564(b)(1) of the Act, 21  U.S.C. section 360bbb-3(b)(1), unless the authorization is  terminated or revoked. Performed at [facility], [place] [address], [place], [place] [address]      Labs: BNP (last 3 results) Recent Labs    07/25/2019 0621  BNP 269.4*   Basic Metabolic Panel: No results for input(s): NA, K, CL, CO2, GLUCOSE, BUN, CREATININE, CALCIUM, MG, PHOS in the last 168 hours. Liver Function Tests: No results for input(s): AST, ALT, ALKPHOS, BILITOT, PROT, ALBUMIN in the last 168 hours. No results for input(s): LIPASE, AMYLASE in the last 168 hours. No results for input(s): AMMONIA in the last 168 hours. CBC: No results for input(s): WBC, NEUTROABS, HGB, HCT, MCV, PLT in the last 168 hours. Cardiac Enzymes: No results for input(s): CKTOTAL, CKMB, CKMBINDEX, TROPONINI in the last 168 hours. BNP: Invalid input(s): POCBNP CBG: No results for input(s): GLUCAP in the last 168 hours. D-Dimer No results for input(s): DDIMER in the last 72 hours. Hgb A1c No results for input(s): HGBA1C in the last 72 hours. Lipid Profile No results for input(s): CHOL, HDL, LDLCALC, TRIG, CHOLHDL, LDLDIRECT in the last 72 hours. Thyroid function studies No results for input(s): TSH, T4TOTAL, T3FREE, THYROIDAB in the last 72 hours.  Invalid input(s): FREET3 Anemia work up No results for input(s): VITAMINB12, FOLATE, FERRITIN, TIBC, IRON, RETICCTPCT in the last 72 hours. Urinalysis    Component Value Date/Time   COLORURINE COLORLESS (A) 02/24/2018 1335   APPEARANCEUR CLEAR 02/24/2018 1335   LABSPEC 1.005  02/24/2018 1335   PHURINE 6.0 02/24/2018 1335   GLUCOSEU NEGATIVE 02/24/2018 1335   HGBUR NEGATIVE 02/24/2018 1335   BILIRUBINUR NEGATIVE 02/24/2018 1335   KETONESUR NEGATIVE 02/24/2018 1335   PROTEINUR NEGATIVE 02/24/2018 1335   UROBILINOGEN 0.2 05/05/2009 0353   NITRITE NEGATIVE 02/24/2018 1335   LEUKOCYTESUR NEGATIVE 02/24/2018 1335   Sepsis Labs Invalid input(s): PROCALCITONIN,  WBC,  [facility] Recent Results (from the past 240 hour(s))  Blood Culture (routine x 2)     Status: None   Collection Time: 07/15/2019  8:03 AM   Specimen: BLOOD  Result Value Ref Range Status   Specimen Description BLOOD LEFT ANTECUBITAL  Final   Special Requests   Final    BOTTLES DRAWN AEROBIC AND ANAEROBIC Blood Culture results may not be optimal due to an inadequate volume of blood received in culture bottles   Culture   Final    NO GROWTH 5 DAYS Performed at [facility], [place] [address], [place], [place] [address]    Report Status 08/01/2019 FINAL  Final  Blood Culture (routine x 2)     Status: None   Collection Time: 07/11/2019  8:05 AM   Specimen: BLOOD RIGHT FOREARM  Result Value Ref Range Status   Specimen Description BLOOD RIGHT FOREARM  Final   Special Requests   Final    BOTTLES DRAWN AEROBIC ONLY Blood Culture results may not be optimal due to an inadequate volume of blood received in culture bottles   Culture   Final    NO GROWTH 5 DAYS Performed at [facility], [place] [address] [place], [place] [address]    Report Status 08/01/2019 FINAL  Final  Respiratory Panel by RT PCR (Flu A&B, Covid) - Nasopharyngeal Swab     Status: None   Collection Time: 07/16/2019 10:37 AM   Specimen: Nasopharyngeal Swab  Result Value Ref Range Status   SARS Coronavirus 2 by RT PCR NEGATIVE NEGATIVE Final    Comment: (NOTE) SARS-CoV-2 target nucleic acids are NOT DETECTED. The SARS-CoV-2 RNA is generally detectable in upper respiratoy specimens during the acute phase of  infection. The lowest concentration of SARS-CoV-2 viral copies this assay can detect is 131 copies/mL. A negative result does not preclude SARS-Cov-2 infection and should not be used as the sole basis for treatment or other patient management decisions. A negative result may occur with  improper specimen collection/handling, submission of specimen other than nasopharyngeal swab, presence of viral mutation(s) within the areas targeted by this assay, and inadequate number of viral copies (<131 copies/mL). A negative result must be combined with clinical observations, patient history, and epidemiological information. The expected result is Negative. Fact Sheet for Patients:  PinkCheek.be Fact Sheet for Healthcare Providers:  GravelBags.it This test is not yet ap proved or cleared by the [place] FDA and  has been authorized for detection and/or diagnosis of SARS-CoV-2 by FDA under an Emergency Use Authorization (EUA). This EUA will remain  in effect (meaning this test can be used) for the duration of the COVID-19 declaration under Section 564(b)(1) of the Act, 21 U.S.C. section 360bbb-3(b)(1), unless the authorization is terminated or revoked sooner.    Influenza A by PCR NEGATIVE NEGATIVE Final   Influenza B by PCR NEGATIVE NEGATIVE Final    Comment: (NOTE) The Xpert Xpress SARS-CoV-2/FLU/RSV assay is intended as an aid in  the diagnosis of influenza from Nasopharyngeal swab specimens and  should not be used as a sole basis for treatment. Nasal washings and  aspirates are unacceptable for Xpert Xpress SARS-CoV-2/FLU/RSV  testing. Fact Sheet for Patients: PinkCheek.be Fact Sheet for Healthcare Providers: GravelBags.it This test is not yet approved or cleared by the [place] FDA and  has been authorized for detection and/or diagnosis of SARS-CoV-2 by  FDA under  an Emergency Use Authorization (EUA). This EUA will remain  in effect (meaning this  test can be used) for the duration of the  Covid-19 declaration under Section 564(b)(1) of the Act, 21  U.S.C. section 360bbb-3(b)(1), unless the authorization is  terminated or revoked. Performed at [facility], [place] [address], [place], [place] [address]      Time coordinating discharge: Over 30 minutes  SIGNED:   Nicolette Bang, MD  [facility] 08/05/2019, 1:39 PM Pager   If 7PM-7AM, please contact night-coverage [email or web address] Password [email or web address]

## 2019-08-28 DEATH — deceased
